# Patient Record
Sex: Male | Born: 1945 | Race: White | Hispanic: No | Marital: Married | Smoking: Former smoker
Health system: Southern US, Community
[De-identification: ages and names within clinical notes are randomized; demographics above are authoritative.]

## PROBLEM LIST (undated history)

## (undated) DIAGNOSIS — K5730 Diverticulosis of large intestine without perforation or abscess without bleeding: Secondary | ICD-10-CM

## (undated) DIAGNOSIS — I499 Cardiac arrhythmia, unspecified: Secondary | ICD-10-CM

## (undated) DIAGNOSIS — I4891 Unspecified atrial fibrillation: Secondary | ICD-10-CM

## (undated) DIAGNOSIS — R198 Other specified symptoms and signs involving the digestive system and abdomen: Secondary | ICD-10-CM

## (undated) DIAGNOSIS — K219 Gastro-esophageal reflux disease without esophagitis: Secondary | ICD-10-CM

## (undated) DIAGNOSIS — I10 Essential (primary) hypertension: Secondary | ICD-10-CM

## (undated) DIAGNOSIS — E079 Disorder of thyroid, unspecified: Secondary | ICD-10-CM

## (undated) DIAGNOSIS — E785 Hyperlipidemia, unspecified: Secondary | ICD-10-CM

## (undated) DIAGNOSIS — I340 Nonrheumatic mitral (valve) insufficiency: Secondary | ICD-10-CM

## (undated) DIAGNOSIS — E039 Hypothyroidism, unspecified: Secondary | ICD-10-CM

## (undated) DIAGNOSIS — J189 Pneumonia, unspecified organism: Secondary | ICD-10-CM

## (undated) DIAGNOSIS — S72142A Displaced intertrochanteric fracture of left femur, initial encounter for closed fracture: Secondary | ICD-10-CM

## (undated) DIAGNOSIS — K5732 Diverticulitis of large intestine without perforation or abscess without bleeding: Secondary | ICD-10-CM

## (undated) DIAGNOSIS — G479 Sleep disorder, unspecified: Secondary | ICD-10-CM

## (undated) DIAGNOSIS — R4586 Emotional lability: Secondary | ICD-10-CM

## (undated) DIAGNOSIS — I779 Disorder of arteries and arterioles, unspecified: Secondary | ICD-10-CM

## (undated) DIAGNOSIS — M1990 Unspecified osteoarthritis, unspecified site: Secondary | ICD-10-CM

## (undated) DIAGNOSIS — I071 Rheumatic tricuspid insufficiency: Secondary | ICD-10-CM

## (undated) DIAGNOSIS — I480 Paroxysmal atrial fibrillation: Secondary | ICD-10-CM

## (undated) DIAGNOSIS — D649 Anemia, unspecified: Secondary | ICD-10-CM

## (undated) DIAGNOSIS — I440 Atrioventricular block, first degree: Secondary | ICD-10-CM

## (undated) DIAGNOSIS — K5790 Diverticulosis of intestine, part unspecified, without perforation or abscess without bleeding: Secondary | ICD-10-CM

## (undated) HISTORY — DX: Hypothyroidism, unspecified: E03.9

## (undated) HISTORY — DX: Unspecified atrial fibrillation: I48.91

## (undated) HISTORY — PX: TONSILLECTOMY: SUR1361

## (undated) HISTORY — DX: Gastro-esophageal reflux disease without esophagitis: K21.9

## (undated) HISTORY — DX: Atrioventricular block, first degree: I44.0

## (undated) HISTORY — DX: Essential (primary) hypertension: I10

## (undated) HISTORY — DX: Nonrheumatic mitral (valve) insufficiency: I34.0

## (undated) HISTORY — DX: Displaced intertrochanteric fracture of left femur, initial encounter for closed fracture: S72.142A

## (undated) HISTORY — DX: Hyperlipidemia, unspecified: E78.5

## (undated) HISTORY — DX: Rheumatic tricuspid insufficiency: I07.1

## (undated) HISTORY — PX: KNEE ARTHROSCOPY: SUR90

## (undated) HISTORY — DX: Diverticulosis of intestine, part unspecified, without perforation or abscess without bleeding: K57.90

## (undated) HISTORY — DX: Disorder of arteries and arterioles, unspecified: I77.9

## (undated) HISTORY — DX: Paroxysmal atrial fibrillation: I48.0

## (undated) HISTORY — DX: Anemia, unspecified: D64.9

## (undated) HISTORY — PX: NASAL SEPTUM SURGERY: SHX37

## (undated) HISTORY — DX: Disorder of thyroid, unspecified: E07.9

## (undated) HISTORY — DX: Diverticulosis of large intestine without perforation or abscess without bleeding: K57.30

## (undated) HISTORY — PX: HAND SURGERY: SHX662

## (undated) HISTORY — DX: Unspecified osteoarthritis, unspecified site: M19.90

## (undated) HISTORY — DX: Diverticulitis of large intestine without perforation or abscess without bleeding: K57.32

## (undated) HISTORY — PX: OTHER SURGICAL HISTORY: SHX169

## (undated) HISTORY — PX: SHOULDER OPEN ROTATOR CUFF REPAIR: SHX2407

---

## 2006-10-02 ENCOUNTER — Observation Stay: Payer: Self-pay | Admitting: Internal Medicine

## 2009-05-31 DIAGNOSIS — D62 Acute posthemorrhagic anemia: Secondary | ICD-10-CM

## 2009-05-31 DIAGNOSIS — E039 Hypothyroidism, unspecified: Secondary | ICD-10-CM | POA: Insufficient documentation

## 2009-05-31 DIAGNOSIS — A419 Sepsis, unspecified organism: Secondary | ICD-10-CM

## 2009-05-31 DIAGNOSIS — F32A Depression, unspecified: Secondary | ICD-10-CM | POA: Insufficient documentation

## 2009-05-31 DIAGNOSIS — N1330 Unspecified hydronephrosis: Secondary | ICD-10-CM

## 2009-05-31 DIAGNOSIS — K5730 Diverticulosis of large intestine without perforation or abscess without bleeding: Secondary | ICD-10-CM | POA: Insufficient documentation

## 2009-05-31 DIAGNOSIS — E7800 Pure hypercholesterolemia, unspecified: Secondary | ICD-10-CM | POA: Insufficient documentation

## 2009-05-31 DIAGNOSIS — F329 Major depressive disorder, single episode, unspecified: Secondary | ICD-10-CM

## 2009-05-31 DIAGNOSIS — I10 Essential (primary) hypertension: Secondary | ICD-10-CM | POA: Insufficient documentation

## 2009-05-31 DIAGNOSIS — K5732 Diverticulitis of large intestine without perforation or abscess without bleeding: Secondary | ICD-10-CM | POA: Insufficient documentation

## 2009-05-31 HISTORY — DX: Diverticulosis of large intestine without perforation or abscess without bleeding: K57.30

## 2009-05-31 HISTORY — DX: Hypothyroidism, unspecified: E03.9

## 2009-05-31 HISTORY — DX: Diverticulitis of large intestine without perforation or abscess without bleeding: K57.32

## 2012-12-29 DIAGNOSIS — K5790 Diverticulosis of intestine, part unspecified, without perforation or abscess without bleeding: Secondary | ICD-10-CM

## 2012-12-29 HISTORY — DX: Diverticulosis of intestine, part unspecified, without perforation or abscess without bleeding: K57.90

## 2012-12-29 HISTORY — PX: COLONOSCOPY: SHX174

## 2013-01-12 ENCOUNTER — Encounter: Payer: Self-pay | Admitting: Internal Medicine

## 2013-02-04 ENCOUNTER — Ambulatory Visit (AMBULATORY_SURGERY_CENTER): Payer: Medicare Other | Admitting: *Deleted

## 2013-02-04 ENCOUNTER — Encounter: Payer: Self-pay | Admitting: Internal Medicine

## 2013-02-04 VITALS — Ht 71.0 in | Wt 236.0 lb

## 2013-02-04 DIAGNOSIS — Z1211 Encounter for screening for malignant neoplasm of colon: Secondary | ICD-10-CM

## 2013-02-04 MED ORDER — MOVIPREP 100 G PO SOLR: ORAL | Status: DC

## 2013-02-04 NOTE — Progress Notes (Signed)
Pt. States he had a colonoscopy 10 years ago in [place].  Doctor is deceased that performed the procedure.  Eugene White states he did not have any polyps only mild diverticulosis.

## 2013-02-18 ENCOUNTER — Encounter: Payer: Self-pay | Admitting: Internal Medicine

## 2013-02-18 ENCOUNTER — Ambulatory Visit (AMBULATORY_SURGERY_CENTER): Payer: Medicare Other | Admitting: Internal Medicine

## 2013-02-18 VITALS — BP 141/80 | HR 51 | Temp 96.9°F | Resp 17 | Ht 71.0 in | Wt 236.0 lb

## 2013-02-18 DIAGNOSIS — Z1211 Encounter for screening for malignant neoplasm of colon: Secondary | ICD-10-CM

## 2013-02-18 DIAGNOSIS — D126 Benign neoplasm of colon, unspecified: Secondary | ICD-10-CM

## 2013-02-18 MED ORDER — SODIUM CHLORIDE 0.9 % IV SOLN: 500.0000 mL | INTRAVENOUS | Status: DC

## 2013-02-18 NOTE — Progress Notes (Addendum)
Patient did not have preoperative order for IV antibiotic SSI prophylaxis. (G8918)  Patient did not experience any of the following events: a burn prior to discharge; a fall within the facility; wrong site/side/patient/procedure/implant event; or a hospital transfer or hospital admission upon discharge from the facility. (G8907)  

## 2013-02-18 NOTE — Progress Notes (Signed)
To pacu vss, report to RN, bbs=clear

## 2013-02-18 NOTE — Op Note (Signed)
[facility] [address] [place], [address]   COLONOSCOPY PROCEDURE REPORT  PATIENT: [name], Eugene  MR#: [id number] BIRTHDATE: 12/01/1946 , 66  yrs. old GENDER: Male ENDOSCOPIST: Hart Carwin, MD REFERRED BY:  Mila Merry, M.D. PROCEDURE DATE:  02/18/2013 PROCEDURE:   Colonoscopy with cold biopsy polypectomy ASA CLASS:   Class II INDICATIONS:Average risk patient for colon cancer and normal colonoscopy in [facility] 2002. MEDICATIONS: MAC sedation, administered by CRNA and Propofol (Diprivan) 220 mg IV  DESCRIPTION OF PROCEDURE:   After the risks and benefits and of the procedure were explained, informed consent was obtained.  A digital rectal exam revealed no abnormalities of the rectum.    The LB CF-[id number]  endoscope was introduced through the anus and advanced to the cecum, which was identified by both the appendix and ileocecal valve .  The quality of the prep was good, using MoviPrep .  The instrument was then slowly withdrawn as the colon was fully examined.     COLON FINDINGS: A smooth sessile polyp ranging between 3-107mm in size was found in the sigmoid colon.  A polypectomy was performed with cold forceps.  The resection was complete and the polyp tissue was completely retrieved.   There was severe diverticulosis noted throughout the entire examined colon with associated angulation, muscular hypertrophy and tortuosity.     Retroflexed views revealed no abnormalities.     The scope was then withdrawn from the patient and the procedure completed.  COMPLICATIONS: There were no complications. ENDOSCOPIC IMPRESSION: 1.   Sessile polyp ranging between 3-86mm in size was found in the sigmoid colon; polypectomy was performed with cold forceps 2.   There was severe diverticulosis noted throughout the entire examined colon , large wide mouth diverticuli particularly in the sigmoid colon  RECOMMENDATIONS: 1.  Await pathology results 2.  High  fiber diet 3. Metamucil 1-2 tsp daily  REPEAT EXAM: for Colonoscopy.pending pathology  cc:  _______________________________ eSigned:  Hart Carwin, MD 02/18/2013 10:07 AM     PATIENT NAME:  [name], Eugene MR#: [id number]

## 2013-02-18 NOTE — Patient Instructions (Addendum)
YOU HAD AN ENDOSCOPIC PROCEDURE TODAY AT THE [facility]: Refer to the procedure report that was given to you for any specific questions about what was found during the examination.  If the procedure report does not answer your questions, please call your gastroenterologist to clarify.  If you requested that your care partner not be given the details of your procedure findings, then the procedure report has been included in a sealed envelope for you to review at your convenience later.  YOU SHOULD EXPECT: Some feelings of bloating in the abdomen. Passage of more gas than usual.  Walking can help get rid of the air that was put into your GI tract during the procedure and reduce the bloating. If you had a lower endoscopy (such as a colonoscopy or flexible sigmoidoscopy) you may notice spotting of blood in your stool or on the toilet paper. If you underwent a bowel prep for your procedure, then you may not have a normal bowel movement for a few days.  DIET: Your first meal following the procedure should be a light meal and then it is ok to progress to your normal diet.  A half-sandwich or bowl of soup is an example of a good first meal.  Heavy or fried foods are harder to digest and may make you feel nauseous or bloated.  Likewise meals heavy in dairy and vegetables can cause extra gas to form and this can also increase the bloating.  Drink plenty of fluids but you should avoid alcoholic beverages for 24 hours.  ACTIVITY: Your care partner should take you home directly after the procedure.  You should plan to take it easy, moving slowly for the rest of the day.  You can resume normal activity the day after the procedure however you should NOT DRIVE or use heavy machinery for 24 hours (because of the sedation medicines used during the test).    SYMPTOMS TO REPORT IMMEDIATELY: A gastroenterologist can be reached at any hour.  During normal business hours, 8:30 AM to 5:00 PM Monday through Friday,  call [phone].  After hours and on weekends, please call the GI answering service at [phone] who will take a message and have the physician on call contact you.   Following lower endoscopy (colonoscopy or flexible sigmoidoscopy):  Excessive amounts of blood in the stool  Significant tenderness or worsening of abdominal pains  Swelling of the abdomen that is new, acute  Fever of 100F or higher  FOLLOW UP: If any biopsies were taken you will be contacted by phone or by letter within the next 1-3 weeks.  Call your gastroenterologist if you have not heard about the biopsies in 3 weeks.  Our staff will call the home number listed on your records the next business day following your procedure to check on you and address any questions or concerns that you may have at that time regarding the information given to you following your procedure. This is a courtesy call and so if there is no answer at the home number and we have not heard from you through the emergency physician on call, we will assume that you have returned to your regular daily activities without incident.  Thank-you for choosing [place] for your healthcare needs today.  SIGNATURES/CONFIDENTIALITY: You and/or your care partner have signed paperwork which will be entered into your electronic medical record.  These signatures attest to the fact that that the information above on your After Visit Summary has been reviewed and  is understood.  Full responsibility of the confidentiality of this discharge information lies with you and/or your care-partner.

## 2013-02-18 NOTE — Progress Notes (Signed)
Called to room to assist during endoscopic procedure.  Patient ID and intended procedure confirmed with present staff. Received instructions for my participation in the procedure from the performing physician.  

## 2013-02-21 ENCOUNTER — Telehealth: Payer: Self-pay

## 2013-02-21 NOTE — Telephone Encounter (Signed)
  Follow up Call-  Call back number 02/18/2013  Post procedure Call Back phone  # [phone]  Permission to leave phone message Yes     Patient questions:  Do you have a fever, pain , or abdominal swelling? no Pain Score  0 *  Have you tolerated food without any problems? yes  Have you been able to return to your normal activities? yes  Do you have any questions about your discharge instructions: Diet   no Medications  no Follow up visit  no  Do you have questions or concerns about your Care? no  Actions: * If pain score is 4 or above: No action needed, pain <4.

## 2013-02-22 ENCOUNTER — Encounter: Payer: Self-pay | Admitting: Internal Medicine

## 2013-03-18 ENCOUNTER — Ambulatory Visit: Payer: Self-pay | Admitting: Family Medicine

## 2014-03-08 ENCOUNTER — Encounter: Payer: Self-pay | Admitting: Internal Medicine

## 2014-03-08 ENCOUNTER — Encounter: Payer: Self-pay | Admitting: *Deleted

## 2014-05-09 ENCOUNTER — Ambulatory Visit: Payer: Medicare Other | Admitting: Internal Medicine

## 2014-05-30 ENCOUNTER — Encounter: Payer: Self-pay | Admitting: *Deleted

## 2014-06-01 ENCOUNTER — Encounter: Payer: Self-pay | Admitting: Gastroenterology

## 2014-06-01 ENCOUNTER — Ambulatory Visit (INDEPENDENT_AMBULATORY_CARE_PROVIDER_SITE_OTHER): Payer: Commercial Managed Care - HMO | Admitting: Gastroenterology

## 2014-06-01 ENCOUNTER — Other Ambulatory Visit (INDEPENDENT_AMBULATORY_CARE_PROVIDER_SITE_OTHER): Payer: Commercial Managed Care - HMO

## 2014-06-01 VITALS — BP 130/84 | HR 90 | Ht 71.0 in | Wt 227.0 lb

## 2014-06-01 DIAGNOSIS — R109 Unspecified abdominal pain: Secondary | ICD-10-CM

## 2014-06-01 DIAGNOSIS — R634 Abnormal weight loss: Secondary | ICD-10-CM

## 2014-06-01 DIAGNOSIS — D72829 Elevated white blood cell count, unspecified: Secondary | ICD-10-CM

## 2014-06-01 DIAGNOSIS — R509 Fever, unspecified: Secondary | ICD-10-CM

## 2014-06-01 DIAGNOSIS — D649 Anemia, unspecified: Secondary | ICD-10-CM

## 2014-06-01 LAB — BASIC METABOLIC PANEL
BUN: 13 mg/dL (ref 6–23)
CALCIUM: 9.5 mg/dL (ref 8.4–10.5)
CHLORIDE: 101 meq/L (ref 96–112)
CO2: 28 mEq/L (ref 19–32)
CREATININE: 0.7 mg/dL (ref 0.4–1.5)
GFR: 117 mL/min (ref >60.00)
Glucose, Bld: 115 mg/dL — ABNORMAL HIGH (ref 70–99)
Potassium: 4 mEq/L (ref 3.5–5.1)
SODIUM: 136 meq/L (ref 135–145)

## 2014-06-01 LAB — IGA: IGA: 188 mg/dL (ref 68–378)

## 2014-06-01 NOTE — Patient Instructions (Addendum)
You have been scheduled for an endoscopy with propofol. Please follow written instructions given to you at your visit today. If you use inhalers (even only as needed), please bring them with you on the day of your procedure. Your physician has requested that you go to www.startemmi.com and enter the access code given to you at your visit today. This web site gives a general overview about your procedure. However, you should still follow specific instructions given to you by our office regarding your preparation for the procedure.  Your physician has requested that you go to the basement for the following lab work before leaving today: TTG IGA BMP  You have been scheduled for a CT scan of the abdomen and pelvis at [facility]. You will have to go by [facility] to pick up the contrast for the scan Nothing to eat or drink four hours prior to scan  Your appointment is scheduled for 06-05-2014 at 330 pm, please arrive 15 minutes early for registration  Address: [address] [place], [place] [address]  Phone Number: [phone]

## 2014-06-02 ENCOUNTER — Encounter: Payer: Self-pay | Admitting: Gastroenterology

## 2014-06-02 DIAGNOSIS — R109 Unspecified abdominal pain: Secondary | ICD-10-CM | POA: Insufficient documentation

## 2014-06-02 DIAGNOSIS — R634 Abnormal weight loss: Secondary | ICD-10-CM | POA: Insufficient documentation

## 2014-06-02 DIAGNOSIS — D72829 Elevated white blood cell count, unspecified: Secondary | ICD-10-CM | POA: Insufficient documentation

## 2014-06-02 DIAGNOSIS — R509 Fever, unspecified: Secondary | ICD-10-CM | POA: Insufficient documentation

## 2014-06-02 DIAGNOSIS — R1084 Generalized abdominal pain: Secondary | ICD-10-CM | POA: Insufficient documentation

## 2014-06-02 DIAGNOSIS — D649 Anemia, unspecified: Secondary | ICD-10-CM | POA: Insufficient documentation

## 2014-06-02 NOTE — Progress Notes (Signed)
     06/02/2014 Eugene White [id number] 05/11/1946   History of Present Illness:  This is a pleasant 68 year old male who is known to Dr. Olevia Perches for previous colonoscopy in February 2014. He was found to have a sessile polyp removed from the sigmoid colon that was hyperplastic on pathology at that time as well, as having severe diverticulosis throughout the entire colon.  He presents to our office today for a few different issues. He states that on May 25 he developed severe abdominal pain that was rather diffuse and came on suddenly. This resolved slowly over a few days time, and he currently has little to no pain. Throughout all of this, however, he's also had some low-grade fevers as well as a 6 pound weight loss. Recent labs show a CBC with several abnormalities including a leukocytosis of 16.1. His platelets are also elevated at 511. His hemoglobin is low at 10.4 grams, which is down from 15 grams (per his verbal report) in February.  His ferritin is normal at 103 and TIBC is normal at 362, but serum iron is low at 15 and iron percent saturation is low at 4%. Vitamin B12 and folate are normal. He says that he was previously taking meloxicam but has discontinued that along with his aspirin 81 mg. He was Hemoccult negative at his PCPs office. He is currently taking Cipro for empiric treatment of diverticulitis per recommendations from the PA at his PCPs office. He reports that he underwent an EGD 25+ years ago. He has been on pantoprazole 40 mg daily for chronic GERD for several years.   Current Medications, Allergies, Past Medical History, Past Surgical History, Family History and Social History were reviewed in [employer] record.   Physical Exam: BP 130/84  Pulse 90  Ht $R'5\' 11"'fa$  (1.803 m)  Wt 227 lb (102.967 kg)  BMI 31.67 kg/m2 General: Well developed white male in no acute distress Head: Normocephalic and atraumatic Eyes:  Sclerae anicteric, conjunctiva pink  Ears:  Normal auditory acuity Lungs: Clear throughout to auscultation Heart: Regular rate and rhythm Abdomen: Soft, non-distended.  Normal bowel sounds.  Minimal diffuse TTP without R/R/G. Musculoskeletal: Symmetrical with no gross deformities  Extremities: No edema  Neurological: Alert oriented x 4, grossly non-focal Psychological:  Alert and cooperative. Normal mood and affect  Assessment and Recommendations: -This is a 68 year old male who has a myriad of issues that have occurred rather abruptly.  He had some diffuse nonspecific abdominal pain, which has improved significantly, but persists with low-grade fevers and has had some weight loss. Labs show a new leukocytosis of 16.1, new iron deficiency anemia of 10.4 grams, and thrombocytopenia as well.  I am unsure what process is causing these issues. I think that beginning with CT scan of the abdomen and pelvis with contrast is not unreasonable. We will check some other labs including BMP and celiac titers. We will schedule for EGD to rule any source of upper GI bleed causing his anemia. He was previously taking meloxicam, but has discontinued that and will remain off of that medication. -Self-limited/transient rectal bleeding:  This occurred a couple of months ago on one occasion. Likely low-grade diverticular bleed or hemorrhoidal bleed. Now resolved. -Long-standing GERD:  Seems to be overall well-controlled on pantoprazole 40 mg daily, which he will continue for now.

## 2014-06-05 ENCOUNTER — Other Ambulatory Visit: Payer: Commercial Managed Care - HMO

## 2014-06-05 ENCOUNTER — Ambulatory Visit
Admission: RE | Admit: 2014-06-05 | Discharge: 2014-06-05 | Disposition: A | Discharge status: Home / Self Care | Payer: Commercial Managed Care - HMO | Source: Ambulatory Visit | Attending: Gastroenterology | Admitting: Gastroenterology

## 2014-06-05 DIAGNOSIS — R509 Fever, unspecified: Secondary | ICD-10-CM

## 2014-06-05 DIAGNOSIS — R109 Unspecified abdominal pain: Secondary | ICD-10-CM

## 2014-06-05 DIAGNOSIS — D72829 Elevated white blood cell count, unspecified: Secondary | ICD-10-CM

## 2014-06-05 DIAGNOSIS — R634 Abnormal weight loss: Secondary | ICD-10-CM

## 2014-06-05 MED ORDER — IOHEXOL 300 MG/ML  SOLN
125.0000 mL | Freq: Once | INTRAMUSCULAR | Status: AC | PRN
  Administered 2014-06-05: 125 mL via INTRAVENOUS

## 2014-06-05 NOTE — Progress Notes (Signed)
Reviewed and agree, CT scan appropriate to r/o pancreatic process, lymphoma  Etc.

## 2014-06-06 ENCOUNTER — Other Ambulatory Visit: Payer: Self-pay | Admitting: *Deleted

## 2014-06-06 DIAGNOSIS — R109 Unspecified abdominal pain: Secondary | ICD-10-CM

## 2014-06-06 LAB — T-TRANSGLUTAMINASE (TTG) IGG: TISSUE TRANSGLUT AB: 2 U/mL (ref 0–5)

## 2014-06-08 ENCOUNTER — Encounter: Payer: Self-pay | Admitting: Gastroenterology

## 2014-06-09 ENCOUNTER — Other Ambulatory Visit: Payer: Self-pay | Admitting: *Deleted

## 2014-06-09 ENCOUNTER — Encounter: Payer: Self-pay | Admitting: Internal Medicine

## 2014-06-09 ENCOUNTER — Other Ambulatory Visit (INDEPENDENT_AMBULATORY_CARE_PROVIDER_SITE_OTHER): Payer: Commercial Managed Care - HMO

## 2014-06-09 ENCOUNTER — Ambulatory Visit (AMBULATORY_SURGERY_CENTER): Payer: Commercial Managed Care - HMO | Admitting: Internal Medicine

## 2014-06-09 VITALS — BP 126/86 | HR 66 | Temp 97.3°F | Resp 26 | Ht 71.0 in | Wt 227.0 lb

## 2014-06-09 DIAGNOSIS — D649 Anemia, unspecified: Secondary | ICD-10-CM

## 2014-06-09 DIAGNOSIS — D1330 Benign neoplasm of unspecified part of small intestine: Secondary | ICD-10-CM

## 2014-06-09 DIAGNOSIS — R933 Abnormal findings on diagnostic imaging of other parts of digestive tract: Secondary | ICD-10-CM

## 2014-06-09 DIAGNOSIS — K5289 Other specified noninfective gastroenteritis and colitis: Secondary | ICD-10-CM

## 2014-06-09 DIAGNOSIS — K529 Noninfective gastroenteritis and colitis, unspecified: Secondary | ICD-10-CM

## 2014-06-09 LAB — CBC WITH DIFFERENTIAL/PLATELET
BASOS PCT: 0.6 % (ref 0.0–3.0)
Basophils Absolute: 0 10*3/uL (ref 0.0–0.1)
EOS PCT: 4.5 % (ref 0.0–5.0)
Eosinophils Absolute: 0.3 10*3/uL (ref 0.0–0.7)
HCT: 31.8 % — ABNORMAL LOW (ref 39.0–52.0)
Hemoglobin: 10.2 g/dL — ABNORMAL LOW (ref 13.0–17.0)
LYMPHS PCT: 26.6 % (ref 12.0–46.0)
Lymphs Abs: 1.6 10*3/uL (ref 0.7–4.0)
MCHC: 32 g/dL (ref 30.0–36.0)
MCV: 73.8 fl — AB (ref 78.0–100.0)
MONO ABS: 0.6 10*3/uL (ref 0.1–1.0)
MONOS PCT: 10.5 % (ref 3.0–12.0)
NEUTROS PCT: 57.8 % (ref 43.0–77.0)
Neutro Abs: 3.5 10*3/uL (ref 1.4–7.7)
PLATELETS: 564 10*3/uL — AB (ref 150.0–400.0)
RBC: 4.3 Mil/uL (ref 4.22–5.81)
RDW: 20 % — ABNORMAL HIGH (ref 11.5–15.5)
WBC: 6 10*3/uL (ref 4.0–10.5)

## 2014-06-09 LAB — SEDIMENTATION RATE: SED RATE: 29 mm/h — AB (ref 0–22)

## 2014-06-09 MED ORDER — SODIUM CHLORIDE 0.9 % IV SOLN: 500.0000 mL | INTRAVENOUS | Status: DC

## 2014-06-09 MED ORDER — HYDROCHLOROTHIAZIDE 25 MG PO TABS: 25.0000 mg | ORAL_TABLET | Freq: Every day | ORAL | Status: DC

## 2014-06-09 NOTE — Op Note (Signed)
[place]  [address]. [place], [address]   ENDOSCOPY PROCEDURE REPORT  PATIENT: Hanley, Woerner  MR#: [id number] BIRTHDATE: Apr 17, 1946 , 67  yrs. old GENDER: Male ENDOSCOPIST: Lafayette Dragon, MD REFERRED BY:  Lelon Huh, M.D. PROCEDURE DATE:  06/09/2014 PROCEDURE:  enteroscopy with biopsy ASA CLASS:     Class II INDICATIONS:  A.  acute abdominal pain.  Fever and anemia.  CT scan of the abdomen suggest acute jejunitis associated with mesenteric stranding.  He has completed a course of Cipro and is feeling better, he has been on ACE inhibitors, rule out angioneurotic edema. MEDICATIONS: MAC sedation, administered by CRNA and propofol (Diprivan) 250mg  IV TOPICAL ANESTHETIC: none  DESCRIPTION OF PROCEDURE: After the risks benefits and alternatives of the procedure were thoroughly explained, informed consent was obtained.  The LB PFC-H190 [id number] endoscope was introduced through the mouth and advanced to the proximal jejunum. Without limitations.  The instrument was slowly withdrawn as the mucosa was fully examined.      Esophagus, proximal mid and distal esophageal mucosa was normal. Was no evidence of esophagitis,there was a mild nonobstructing esophageal stricture which allowed the endoscope to traverse into small hiatal hernia Stoma: Gastric mucosa was normal there was no evidence of gastritis. Retroflexion of the endoscope revealing normal fundus and cardia[ duodenum: Duodenal bulb and descending duodenum was unremarkable. Mucosa appeared normal there was no evidence of edema or any inflammatory changes. Jejunum: Jejunal mucosa appeared normal. I was able to advance to 180 cm the show was the full size of the enteroscope. This was located most likely distal to the ligament of Treitz in the proximal jejunum. Random biopsies were obtained of the jejunal mucosa which appeared normal        The scope was then withdrawn from the patient and the  procedure completed.  COMPLICATIONS: There were no complications. ENDOSCOPIC IMPRESSION: essentially normal enteroscopy of the esophagus stomach duodenum and proximal jejunum to 190 cm. No evidence of inflammatory changes which would correlate with the CT scan findings. 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  Await biopsy results 2.  discontinue ACE inhibitor CBC and sed rates today Monitor her temperature Light food  REPEAT EXAM:fno recall.  eSigned:  Lafayette Dragon, MD 06/09/2014 10:22 AM   CC:  PATIENT NAME:  Artist, Bloom MR#: [id number]

## 2014-06-09 NOTE — Progress Notes (Signed)
Pt. To lab for CBC and sed rate before leaving clinic.

## 2014-06-09 NOTE — Progress Notes (Signed)
A/ox3 pleased with MAC, report to Jane RN 

## 2014-06-09 NOTE — Patient Instructions (Signed)

## 2014-06-09 NOTE — Progress Notes (Signed)
Called to room to assist during endoscopic procedure.  Patient ID and intended procedure confirmed with present staff. Received instructions for my participation in the procedure from the performing physician.  

## 2014-06-12 ENCOUNTER — Telehealth: Payer: Self-pay | Admitting: *Deleted

## 2014-06-12 NOTE — Telephone Encounter (Signed)
  Follow up Call-  Call back number 06/09/2014 02/18/2013  Post procedure Call Back phone  # [phone]  Permission to leave phone message Yes Yes     Patient questions:  Do you have a fever, pain , or abdominal swelling? no Pain Score  0 *  Have you tolerated food without any problems? yes  Have you been able to return to your normal activities? yes  Do you have any questions about your discharge instructions: Diet   no Medications  no Follow up visit  no  Do you have questions or concerns about your Care? no  Actions: * If pain score is 4 or above: No action needed, pain <4.

## 2014-06-13 ENCOUNTER — Encounter: Payer: Self-pay | Admitting: Internal Medicine

## 2014-06-14 ENCOUNTER — Encounter: Payer: Self-pay | Admitting: *Deleted

## 2014-06-20 ENCOUNTER — Telehealth: Payer: Self-pay | Admitting: Internal Medicine

## 2014-06-20 NOTE — Telephone Encounter (Signed)
Patient is asking for lab results. He is also asking if he can restart his low dose aspirin and Meloxicam. Please, advise.

## 2014-06-21 NOTE — Telephone Encounter (Signed)
Please call pt with low Hgb  And Iron levels, needs to be on Iron supplements daily. Please repeat CBC in in 2 weeks ( which will be 1 month since his last exam).

## 2014-06-22 ENCOUNTER — Other Ambulatory Visit: Payer: Self-pay | Admitting: *Deleted

## 2014-06-22 DIAGNOSIS — D509 Iron deficiency anemia, unspecified: Secondary | ICD-10-CM

## 2014-06-22 NOTE — Telephone Encounter (Signed)
Patient given results and recommendations. He is asking if he can restart his Meloxicam and low dose aspirin. Please, advise.

## 2014-06-23 NOTE — Telephone Encounter (Signed)
OK to resume Meloxican and ASA

## 2014-06-23 NOTE — Telephone Encounter (Signed)
Patient notified of recommendations. 

## 2014-07-06 ENCOUNTER — Telehealth: Payer: Self-pay | Admitting: *Deleted

## 2014-07-06 NOTE — Telephone Encounter (Signed)
Left a message for patient to come for labs next week.

## 2014-07-06 NOTE — Telephone Encounter (Signed)
Message copied by Hulan Saas on Thu Jul 06, 2014  8:17 AM ------      Message from: Hulan Saas      Created: Thu Jun 22, 2014  8:34 AM       Call and remind due for CBC for DB on 07/10/14. Lab in [facility]. ------

## 2014-07-28 ENCOUNTER — Ambulatory Visit: Payer: Medicare Other | Admitting: Internal Medicine

## 2014-10-24 ENCOUNTER — Other Ambulatory Visit: Payer: Self-pay | Admitting: Orthopedic Surgery

## 2015-01-01 ENCOUNTER — Other Ambulatory Visit (HOSPITAL_COMMUNITY): Payer: Self-pay | Admitting: *Deleted

## 2015-01-01 NOTE — Patient Instructions (Addendum)
Eugene White  01/01/2015   Your procedure is scheduled on: 01/09/15   Report to [facility]  Entrance and follow signs to               [place] at 5:30 AM.   Call this number if you have problems the morning of surgery [phone]   Remember:  Do not eat food or drink liquids :After Midnight.     Take these medicines the morning of surgery with A SIP OF WATER: AMLODIPINE / LIPITOR / LEVOTHYROXINE / PROTONIX / EFFEXOR                               You may not have any metal on your body including hair pins and              piercings  Do not wear jewelry, make-up, lotions, powders or perfumes.             Do not wear nail polish.  Do not shave  48 hours prior to surgery.              Men may shave face and neck.   Do not bring valuables to the hospital. [place].  Contacts, dentures or bridgework may not be worn into surgery.  Leave suitcase in the car. After surgery it may be brought to your room.     Patients discharged the day of surgery will not be allowed to drive home.  Name and phone number of your driver:  Special Instructions: N/A              Please read over the following fact sheets you were given: _____________________________________________________________________                                                     [place]  Before surgery, you can play an important role.  Because skin is not sterile, your skin needs to be as free of germs as possible.  You can reduce the number of germs on your skin by washing with CHG (chlorahexidine gluconate) soap before surgery.  CHG is an antiseptic cleaner which kills germs and bonds with the skin to continue killing germs even after washing. Please DO NOT use if you have an allergy to CHG or antibacterial soaps.  If your skin becomes reddened/irritated stop using the CHG and inform your nurse when you arrive at  Short Stay. Do not shave (including legs and underarms) for at least 48 hours prior to the first CHG shower.  You may shave your face. Please follow these instructions carefully:   1.  Shower with CHG Soap the night before surgery and the  morning of Surgery.   2.  If you choose to wash your hair, wash your hair first as usual with your  normal  Shampoo.   3.  After you shampoo, rinse your hair and body thoroughly to remove the  shampoo.  4.  Use CHG as you would any other liquid soap.  You can apply chg directly  to the skin and wash . Gently wash with scrungie or clean wascloth    5.  Apply the CHG Soap to your body ONLY FROM THE NECK DOWN.   Do not use on open                           Wound or open sores. Avoid contact with eyes, ears mouth and genitals (private parts).                        Genitals (private parts) with your normal soap.              6.  Wash thoroughly, paying special attention to the area where your surgery  will be performed.   7.  Thoroughly rinse your body with warm water from the neck down.   8.  DO NOT shower/wash with your normal soap after using and rinsing off  the CHG Soap .                9.  Pat yourself dry with a clean towel.             10.  Wear clean pajamas.             11.  Place clean sheets on your bed the night of your first shower and do not  sleep with pets.  Day of Surgery : Do not apply any lotions/deodorants the morning of surgery.  Please wear clean clothes to the hospital/surgery center.  FAILURE TO FOLLOW THESE INSTRUCTIONS MAY RESULT IN THE CANCELLATION OF YOUR SURGERY    PATIENT SIGNATURE_________________________________  ______________________________________________________________________     Adam Phenix  An incentive spirometer is a tool that can help keep your lungs clear and active. This tool measures how well you are filling your lungs with each breath. Taking  long deep breaths may help reverse or decrease the chance of developing breathing (pulmonary) problems (especially infection) following:  A long period of time when you are unable to move or be active. BEFORE THE PROCEDURE   If the spirometer includes an indicator to show your best effort, your nurse or respiratory therapist will set it to a desired goal.  If possible, sit up straight or lean slightly forward. Try not to slouch.  Hold the incentive spirometer in an upright position. INSTRUCTIONS FOR USE   Sit on the edge of your bed if possible, or sit up as far as you can in bed or on a chair.  Hold the incentive spirometer in an upright position.  Breathe out normally.  Place the mouthpiece in your mouth and seal your lips tightly around it.  Breathe in slowly and as deeply as possible, raising the piston or the ball toward the top of the column.  Hold your breath for 3-5 seconds or for as long as possible. Allow the piston or ball to fall to the bottom of the column.  Remove the mouthpiece from your mouth and breathe out normally.  Rest for a few seconds and repeat Steps 1 through 7 at least 10 times every 1-2 hours when you are awake. Take your time and take a few normal breaths between deep breaths.  The spirometer may include an indicator to show your best effort. Use the indicator as a goal to work toward during  each repetition.  After each set of 10 deep breaths, practice coughing to be sure your lungs are clear. If you have an incision (the cut made at the time of surgery), support your incision when coughing by placing a pillow or rolled up towels firmly against it. Once you are able to get out of bed, walk around indoors and cough well. You may stop using the incentive spirometer when instructed by your caregiver.  RISKS AND COMPLICATIONS  Take your time so you do not get dizzy or light-headed.  If you are in pain, you may need to take or ask for pain medication before  doing incentive spirometry. It is harder to take a deep breath if you are having pain. AFTER USE  Rest and breathe slowly and easily.  It can be helpful to keep track of a log of your progress. Your caregiver can provide you with a simple table to help with this. If you are using the spirometer at home, follow these instructions: [place] IF:   You are having difficultly using the spirometer.  You have trouble using the spirometer as often as instructed.  Your pain medication is not giving enough relief while using the spirometer.  You develop fever of 100.5 F (38.1 C) or higher. SEEK IMMEDIATE MEDICAL CARE IF:   You cough up bloody sputum that had not been present before.  You develop fever of 102 F (38.9 C) or greater.  You develop worsening pain at or near the incision site. MAKE SURE YOU:   Understand these instructions.  Will watch your condition.  Will get help right away if you are not doing well or get worse. Document Released: 04/27/2007 Document Revised: 03/08/2012 Document Reviewed: 06/28/2007 Exit[employer] Patient Information 2014 [employer], [place].   ________________________________________________________________________  WHAT IS A BLOOD TRANSFUSION? Blood Transfusion Information  A transfusion is the replacement of blood or some of its parts. Blood is made up of multiple cells which provide different functions.  Red blood cells carry oxygen and are used for blood loss replacement.  White blood cells fight against infection.  Platelets control bleeding.  Plasma helps clot blood.  Other blood products are available for specialized needs, such as hemophilia or other clotting disorders. BEFORE THE TRANSFUSION  Who gives blood for transfusions?   Healthy volunteers who are fully evaluated to make sure their blood is safe. This is blood bank blood. Transfusion therapy is the safest it has ever been in the practice of medicine. Before blood is taken  from a donor, a complete history is taken to make sure that person has no history of diseases nor engages in risky social behavior (examples are intravenous drug use or sexual activity with multiple partners). The donor's travel history is screened to minimize risk of transmitting infections, such as malaria. The donated blood is tested for signs of infectious diseases, such as HIV and hepatitis. The blood is then tested to be sure it is compatible with you in order to minimize the chance of a transfusion reaction. If you or a relative donates blood, this is often done in anticipation of surgery and is not appropriate for emergency situations. It takes many days to process the donated blood. RISKS AND COMPLICATIONS Although transfusion therapy is very safe and saves many lives, the main dangers of transfusion include:   Getting an infectious disease.  Developing a transfusion reaction. This is an allergic reaction to something in the blood you were given. Every precaution is taken to prevent  this. The decision to have a blood transfusion has been considered carefully by your caregiver before blood is given. Blood is not given unless the benefits outweigh the risks. AFTER THE TRANSFUSION  Right after receiving a blood transfusion, you will usually feel much better and more energetic. This is especially true if your red blood cells have gotten low (anemic). The transfusion raises the level of the red blood cells which carry oxygen, and this usually causes an energy increase.  The nurse administering the transfusion will monitor you carefully for complications. HOME CARE INSTRUCTIONS  No special instructions are needed after a transfusion. You may find your energy is better. Speak with your caregiver about any limitations on activity for underlying diseases you may have. SEEK MEDICAL CARE IF:   Your condition is not improving after your transfusion.  You develop redness or irritation at the  intravenous (IV) site. SEEK IMMEDIATE MEDICAL CARE IF:  Any of the following symptoms occur over the next 12 hours:  Shaking chills.  You have a temperature by mouth above 102 F (38.9 C), not controlled by medicine.  Chest, back, or muscle pain.  People around you feel you are not acting correctly or are confused.  Shortness of breath or difficulty breathing.  Dizziness and fainting.  You get a rash or develop hives.  You have a decrease in urine output.  Your urine turns a dark color or changes to pink, red, or brown. Any of the following symptoms occur over the next 10 days:  You have a temperature by mouth above 102 F (38.9 C), not controlled by medicine.  Shortness of breath.  Weakness after normal activity.  The white part of the eye turns yellow (jaundice).  You have a decrease in the amount of urine or are urinating less often.  Your urine turns a dark color or changes to pink, red, or brown. Document Released: 12/12/2000 Document Revised: 03/08/2012 Document Reviewed: 07/31/2008 [facility] Patient Information 2014 [place], [place].  _______________________________________________________________________

## 2015-01-03 ENCOUNTER — Ambulatory Visit (HOSPITAL_COMMUNITY)
Admission: RE | Admit: 2015-01-03 | Discharge: 2015-01-03 | Disposition: A | Discharge status: Home / Self Care | Payer: Commercial Managed Care - HMO | Source: Ambulatory Visit | Attending: Anesthesiology | Admitting: Anesthesiology

## 2015-01-03 ENCOUNTER — Encounter (HOSPITAL_COMMUNITY)
Admission: RE | Admit: 2015-01-03 | Discharge: 2015-01-03 | Disposition: A | Discharge status: Home / Self Care | Payer: Commercial Managed Care - HMO | Source: Ambulatory Visit | Attending: Orthopedic Surgery | Admitting: Orthopedic Surgery

## 2015-01-03 ENCOUNTER — Encounter (HOSPITAL_COMMUNITY): Payer: Self-pay

## 2015-01-03 DIAGNOSIS — I10 Essential (primary) hypertension: Secondary | ICD-10-CM | POA: Insufficient documentation

## 2015-01-03 DIAGNOSIS — Z01818 Encounter for other preprocedural examination: Secondary | ICD-10-CM | POA: Insufficient documentation

## 2015-01-03 DIAGNOSIS — Z01811 Encounter for preprocedural respiratory examination: Secondary | ICD-10-CM | POA: Diagnosis not present

## 2015-01-03 HISTORY — DX: Other specified symptoms and signs involving the digestive system and abdomen: R19.8

## 2015-01-03 HISTORY — DX: Sleep disorder, unspecified: G47.9

## 2015-01-03 HISTORY — DX: Emotional lability: R45.86

## 2015-01-03 LAB — URINALYSIS, ROUTINE W REFLEX MICROSCOPIC
BILIRUBIN URINE: NEGATIVE
Glucose, UA: NEGATIVE mg/dL
Hgb urine dipstick: NEGATIVE
Ketones, ur: NEGATIVE mg/dL
LEUKOCYTES UA: NEGATIVE
Nitrite: NEGATIVE
PH: 7 (ref 5.0–8.0)
Protein, ur: NEGATIVE mg/dL
SPECIFIC GRAVITY, URINE: 1.016 (ref 1.005–1.030)
Urobilinogen, UA: 0.2 mg/dL (ref 0.0–1.0)

## 2015-01-03 LAB — CBC
HEMATOCRIT: 45.1 % (ref 39.0–52.0)
HEMOGLOBIN: 14.2 g/dL (ref 13.0–17.0)
MCH: 30.1 pg (ref 26.0–34.0)
MCHC: 31.5 g/dL (ref 30.0–36.0)
MCV: 95.8 fL (ref 78.0–100.0)
Platelets: 237 10*3/uL (ref 150–400)
RBC: 4.71 MIL/uL (ref 4.22–5.81)
RDW: 14 % (ref 11.5–15.5)
WBC: 4.8 10*3/uL (ref 4.0–10.5)

## 2015-01-03 LAB — BASIC METABOLIC PANEL
ANION GAP: 10 (ref 5–15)
BUN: 14 mg/dL (ref 6–23)
CHLORIDE: 97 meq/L (ref 96–112)
CO2: 29 mmol/L (ref 19–32)
Calcium: 9.6 mg/dL (ref 8.4–10.5)
Creatinine, Ser: 0.72 mg/dL (ref 0.50–1.35)
GFR calc Af Amer: >90 mL/min (ref >90)
GFR calc non Af Amer: >90 mL/min (ref >90)
Glucose, Bld: 101 mg/dL — ABNORMAL HIGH (ref 70–99)
Potassium: 3.5 mmol/L (ref 3.5–5.1)
Sodium: 136 mmol/L (ref 135–145)

## 2015-01-03 LAB — PROTIME-INR
INR: 0.92 (ref 0.00–1.49)
Prothrombin Time: 12.5 seconds (ref 11.6–15.2)

## 2015-01-03 LAB — APTT: APTT: 26 s (ref 24–37)

## 2015-01-03 LAB — ABO/RH: ABO/RH(D): O POS

## 2015-01-03 LAB — SURGICAL PCR SCREEN
MRSA, PCR: NEGATIVE
Staphylococcus aureus: NEGATIVE

## 2015-01-03 NOTE — Progress Notes (Signed)
   01/03/15 1404  OBSTRUCTIVE SLEEP APNEA  Have you ever been diagnosed with sleep apnea through a sleep study? No  Do you snore loudly (loud enough to be heard through closed doors)?  1  Do you often feel tired, fatigued, or sleepy during the daytime? 0  Has anyone observed you stop breathing during your sleep? 0  Do you have, or are you being treated for high blood pressure? 1  BMI more than 35 kg/m2? 0  Age over 70 years old? 1  Neck circumference greater than 40 cm/16 inches? 1  Gender: 1  Obstructive Sleep Apnea Score 5  Score 4 or greater  Results sent to PCP

## 2015-01-07 NOTE — H&P (Signed)
TOTAL KNEE ADMISSION H&P  Patient is being admitted for right total knee arthroplasty.  Subjective:  Chief Complaint:     Right knee primary OA / pain.  HPI: Eugene White, 69 y.o. male, has a history of pain and functional disability in the right knee due to arthritis and has failed non-surgical conservative treatments for greater than 12 weeks to include  NSAID's and/or analgesics, corticosteriod injections, viscosupplementation injections and activity modification.  Onset of symptoms was gradual, starting 50+ years ago with gradually worsening course since that time. The patient noted prior procedures on the knee to include  arthroscopy on the right knee(s).  Patient currently rates pain in the right knee(s) at 9 out of 10 with activity. Patient has worsening of pain with activity and weight bearing, pain that interferes with activities of daily living, pain with passive range of motion, crepitus and joint swelling.  Patient has evidence of periarticular osteophytes and joint space narrowing by imaging studies. There is no active infection.  Risks, benefits and expectations were discussed with the patient.  Risks including but not limited to the risk of anesthesia, blood clots, nerve damage, blood vessel damage, failure of the prosthesis, infection and up to and including death.  Patient understand the risks, benefits and expectations and wishes to proceed with surgery.   PCP: Lelon Huh, MD  D/C Plans:      Home with HHPT  Post-op Meds:       No Rx given  Tranexamic Acid:      To be given - IV    Decadron:      Is to be given  FYI:     ASA post-op  Norco post-op      Patient Active Problem List   Diagnosis Date Noted  . Fever, unspecified 06/02/2014  . Loss of weight 06/02/2014  . Leukocytosis, unspecified 06/02/2014  . Abdominal pain, unspecified site 06/02/2014  . Anemia 06/02/2014   Past Medical History  Diagnosis Date  . GERD (gastroesophageal reflux disease)   .  Hyperlipidemia   . Hypertension   . Thyroid disease     hypo  . 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 Diverticulosis 2014  . Anemia   . Arthritis     OA  . GI symptom     had nausea / vomited once / frequent stools / getting better  . Difficulty sleeping   . Mood changes     Past Surgical History  Procedure Laterality Date  . Knee arthroscopy  1984 & 2001    twice  . Shoulder open rotator cuff repair  1999 & 2006    Right and left  . Hand surgery   1973 / 2010 / 2015    right to release tendons  . Colonoscopy  2014  . Nasal septum surgery      No prescriptions prior to admission   Allergies  Allergen Reactions  . Sulfa Antibiotics Other (See Comments)    unknown    History  Substance Use Topics  . Smoking status: Former Smoker    Quit date: 12/29/1968  . Smokeless tobacco: Never Used  . Alcohol Use: 6.0 oz/week    10 Cans of beer per week    Family History  Problem Relation Age of Onset  . Breast cancer Mother   . Colon cancer Neg Hx   . Esophageal cancer Neg Hx   . Stomach cancer Neg Hx   .  Rectal cancer Neg Hx      Review of Systems  Constitutional: Negative.   HENT: Negative.   Eyes: Negative.   Respiratory: Negative.   Cardiovascular: Negative.   Gastrointestinal: Positive for heartburn.  Genitourinary: Negative.   Musculoskeletal: Positive for joint pain.  Skin: Negative.   Neurological: Negative.   Endo/Heme/Allergies: Negative.   Psychiatric/Behavioral: Negative.     Objective:  Physical Exam  Constitutional: He is oriented to person, place, and time. He appears well-developed and well-nourished.  HENT:  Head: Normocephalic and atraumatic.  Eyes: Pupils are equal, round, and reactive to light.  Neck: Neck supple. No JVD present. No tracheal deviation present. No thyromegaly present.  Cardiovascular: Normal rate, regular rhythm, normal heart sounds and intact distal pulses.   Respiratory: Effort normal and  breath sounds normal. No stridor. No respiratory distress. He has no wheezes.  GI: Soft. There is no tenderness. There is no guarding.  Musculoskeletal:       Right knee: He exhibits decreased range of motion, swelling, abnormal alignment and bony tenderness. He exhibits no ecchymosis, no deformity, no laceration and no erythema. Tenderness found.  Lymphadenopathy:    He has no cervical adenopathy.  Neurological: He is alert and oriented to person, place, and time.  Skin: Skin is warm and dry.  Psychiatric: He has a normal mood and affect.      Labs:  Estimated body mass index is 31.67 kg/(m^2) as calculated from the following:   Height as of 06/09/14: 5\' 11"  (1.803 m).   Weight as of 06/09/14: 102.967 kg (227 lb).   Imaging Review Plain radiographs demonstrate severe degenerative joint disease of the right knee(s). The overall alignment is  mild varus. The bone quality appears to be good for age and reported activity level.  Assessment/Plan:  End stage arthritis, right knee   The patient history, physical examination, clinical judgment of the provider and imaging studies are consistent with end stage degenerative joint disease of the right knee(s) and total knee arthroplasty is deemed medically necessary. The treatment options including medical management, injection therapy arthroscopy and arthroplasty were discussed at length. The risks and benefits of total knee arthroplasty were presented and reviewed. The risks due to aseptic loosening, infection, stiffness, patella tracking problems, thromboembolic complications and other imponderables were discussed. The patient acknowledged the explanation, agreed to proceed with the plan and consent was signed. Patient is being admitted for inpatient treatment for surgery, pain control, PT, OT, prophylactic antibiotics, VTE prophylaxis, progressive ambulation and ADL's and discharge planning. The patient is planning to be discharged home with home  health services.    Eugene Pugh Johnmark Geiger   PA-C  01/07/2015, 8:45 PM

## 2015-01-09 ENCOUNTER — Encounter (HOSPITAL_COMMUNITY)
Admission: RE | Disposition: A | Discharge status: Home / Self Care | Payer: Self-pay | Source: Ambulatory Visit | Attending: Orthopedic Surgery

## 2015-01-09 ENCOUNTER — Inpatient Hospital Stay (HOSPITAL_COMMUNITY)
Admission: RE | Admit: 2015-01-09 | Discharge: 2015-01-10 | DRG: 470 | Disposition: A | Discharge status: Home / Self Care | Payer: Commercial Managed Care - HMO | Source: Ambulatory Visit | Attending: Orthopedic Surgery | Admitting: Orthopedic Surgery

## 2015-01-09 ENCOUNTER — Encounter (HOSPITAL_COMMUNITY): Payer: Self-pay | Admitting: *Deleted

## 2015-01-09 ENCOUNTER — Inpatient Hospital Stay (HOSPITAL_COMMUNITY): Payer: Commercial Managed Care - HMO | Admitting: Anesthesiology

## 2015-01-09 DIAGNOSIS — M1710 Unilateral primary osteoarthritis, unspecified knee: Secondary | ICD-10-CM | POA: Insufficient documentation

## 2015-01-09 DIAGNOSIS — Z87891 Personal history of nicotine dependence: Secondary | ICD-10-CM

## 2015-01-09 DIAGNOSIS — M659 Synovitis and tenosynovitis, unspecified: Secondary | ICD-10-CM | POA: Diagnosis not present

## 2015-01-09 DIAGNOSIS — I10 Essential (primary) hypertension: Secondary | ICD-10-CM | POA: Diagnosis not present

## 2015-01-09 DIAGNOSIS — E785 Hyperlipidemia, unspecified: Secondary | ICD-10-CM | POA: Diagnosis present

## 2015-01-09 DIAGNOSIS — Z882 Allergy status to sulfonamides status: Secondary | ICD-10-CM | POA: Diagnosis not present

## 2015-01-09 DIAGNOSIS — Z96651 Presence of right artificial knee joint: Secondary | ICD-10-CM

## 2015-01-09 DIAGNOSIS — D649 Anemia, unspecified: Secondary | ICD-10-CM | POA: Diagnosis present

## 2015-01-09 DIAGNOSIS — K219 Gastro-esophageal reflux disease without esophagitis: Secondary | ICD-10-CM | POA: Diagnosis present

## 2015-01-09 DIAGNOSIS — M1711 Unilateral primary osteoarthritis, right knee: Secondary | ICD-10-CM | POA: Diagnosis not present

## 2015-01-09 DIAGNOSIS — Z96659 Presence of unspecified artificial knee joint: Secondary | ICD-10-CM

## 2015-01-09 DIAGNOSIS — M25561 Pain in right knee: Secondary | ICD-10-CM | POA: Diagnosis present

## 2015-01-09 DIAGNOSIS — M179 Osteoarthritis of knee, unspecified: Secondary | ICD-10-CM | POA: Diagnosis not present

## 2015-01-09 DIAGNOSIS — G479 Sleep disorder, unspecified: Secondary | ICD-10-CM | POA: Diagnosis not present

## 2015-01-09 DIAGNOSIS — Z9660 Presence of unspecified orthopedic joint implant: Secondary | ICD-10-CM | POA: Insufficient documentation

## 2015-01-09 DIAGNOSIS — E079 Disorder of thyroid, unspecified: Secondary | ICD-10-CM | POA: Diagnosis not present

## 2015-01-09 DIAGNOSIS — F1010 Alcohol abuse, uncomplicated: Secondary | ICD-10-CM | POA: Diagnosis present

## 2015-01-09 DIAGNOSIS — R2689 Other abnormalities of gait and mobility: Secondary | ICD-10-CM | POA: Diagnosis not present

## 2015-01-09 HISTORY — PX: TOTAL KNEE ARTHROPLASTY: SHX125

## 2015-01-09 LAB — TYPE AND SCREEN
ABO/RH(D): O POS
Antibody Screen: NEGATIVE

## 2015-01-09 SURGERY — ARTHROPLASTY, KNEE, TOTAL
Anesthesia: Spinal | Site: Knee | Laterality: Right

## 2015-01-09 MED ORDER — PROPOFOL 10 MG/ML IV BOLUS
INTRAVENOUS | Status: AC
  Filled 2015-01-09: qty 20

## 2015-01-09 MED ORDER — BISACODYL 10 MG RE SUPP: 10.0000 mg | Freq: Every day | RECTAL | Status: DC | PRN

## 2015-01-09 MED ORDER — ALUM & MAG HYDROXIDE-SIMETH 200-200-20 MG/5ML PO SUSP
30.0000 mL | ORAL | Status: DC | PRN
Start: 2015-01-09 — End: 2015-01-10

## 2015-01-09 MED ORDER — LACTATED RINGERS IV SOLN
INTRAVENOUS | Status: DC | PRN
  Administered 2015-01-09 (×3): via INTRAVENOUS

## 2015-01-09 MED ORDER — ASPIRIN EC 325 MG PO TBEC
325.0000 mg | DELAYED_RELEASE_TABLET | Freq: Two times a day (BID) | ORAL | Status: DC
  Administered 2015-01-09 – 2015-01-10 (×2): 325 mg via ORAL
  Filled 2015-01-09 (×4): qty 1

## 2015-01-09 MED ORDER — DEXAMETHASONE SODIUM PHOSPHATE 10 MG/ML IJ SOLN
INTRAMUSCULAR | Status: DC | PRN
  Administered 2015-01-09: 10 mg via INTRAVENOUS

## 2015-01-09 MED ORDER — BUPIVACAINE-EPINEPHRINE (PF) 0.25% -1:200000 IJ SOLN
INTRAMUSCULAR | Status: DC | PRN
  Administered 2015-01-09: 30 mL

## 2015-01-09 MED ORDER — HYDROCODONE-ACETAMINOPHEN 7.5-325 MG PO TABS: 1.0000 | ORAL_TABLET | ORAL | Status: DC | PRN

## 2015-01-09 MED ORDER — METHOCARBAMOL 500 MG PO TABS
500.0000 mg | ORAL_TABLET | Freq: Four times a day (QID) | ORAL | Status: DC | PRN
  Administered 2015-01-09 – 2015-01-10 (×2): 500 mg via ORAL
  Filled 2015-01-09 (×2): qty 1

## 2015-01-09 MED ORDER — METHOCARBAMOL 1000 MG/10ML IJ SOLN
500.0000 mg | Freq: Four times a day (QID) | INTRAVENOUS | Status: DC | PRN
  Filled 2015-01-09: qty 5

## 2015-01-09 MED ORDER — 0.9 % SODIUM CHLORIDE (POUR BTL) OPTIME
TOPICAL | Status: DC | PRN
  Administered 2015-01-09: 1000 mL

## 2015-01-09 MED ORDER — HYDROMORPHONE HCL 1 MG/ML IJ SOLN: 0.2500 mg | INTRAMUSCULAR | Status: DC | PRN

## 2015-01-09 MED ORDER — MIDAZOLAM HCL 5 MG/5ML IJ SOLN
INTRAMUSCULAR | Status: DC | PRN
  Administered 2015-01-09 (×2): 1 mg via INTRAVENOUS

## 2015-01-09 MED ORDER — PROPOFOL INFUSION 10 MG/ML OPTIME
INTRAVENOUS | Status: DC | PRN
  Administered 2015-01-09: 120 ug/kg/min via INTRAVENOUS

## 2015-01-09 MED ORDER — KETOROLAC TROMETHAMINE 30 MG/ML IJ SOLN
INTRAMUSCULAR | Status: AC
  Filled 2015-01-09: qty 1

## 2015-01-09 MED ORDER — MIDAZOLAM HCL 2 MG/2ML IJ SOLN
INTRAMUSCULAR | Status: AC
  Filled 2015-01-09: qty 2

## 2015-01-09 MED ORDER — TRANEXAMIC ACID 100 MG/ML IV SOLN
1000.0000 mg | INTRAVENOUS | Status: AC
  Administered 2015-01-09: 1000 mg via INTRAVENOUS
  Filled 2015-01-09: qty 10

## 2015-01-09 MED ORDER — MENTHOL 3 MG MT LOZG
1.0000 | LOZENGE | OROMUCOSAL | Status: DC | PRN
  Filled 2015-01-09: qty 9

## 2015-01-09 MED ORDER — TIZANIDINE HCL 4 MG PO TABS: 4.0000 mg | ORAL_TABLET | Freq: Four times a day (QID) | ORAL | Status: DC | PRN

## 2015-01-09 MED ORDER — FENTANYL CITRATE 0.05 MG/ML IJ SOLN
INTRAMUSCULAR | Status: AC
  Filled 2015-01-09: qty 2

## 2015-01-09 MED ORDER — HYDROCHLOROTHIAZIDE 25 MG PO TABS
25.0000 mg | ORAL_TABLET | Freq: Every day | ORAL | Status: DC
  Administered 2015-01-10: 25 mg via ORAL
  Filled 2015-01-09 (×2): qty 1

## 2015-01-09 MED ORDER — IRBESARTAN 150 MG PO TABS
150.0000 mg | ORAL_TABLET | Freq: Every day | ORAL | Status: DC
  Administered 2015-01-10: 150 mg via ORAL
  Filled 2015-01-09 (×2): qty 1

## 2015-01-09 MED ORDER — SODIUM CHLORIDE 0.9 % IR SOLN
Status: DC | PRN
  Administered 2015-01-09: 1000 mL

## 2015-01-09 MED ORDER — POLYETHYLENE GLYCOL 3350 17 G PO PACK: 17.0000 g | PACK | Freq: Every day | ORAL | Status: DC | PRN

## 2015-01-09 MED ORDER — MAGNESIUM CITRATE PO SOLN: 1.0000 | Freq: Once | ORAL | Status: AC | PRN

## 2015-01-09 MED ORDER — METOCLOPRAMIDE HCL 5 MG/ML IJ SOLN: 5.0000 mg | Freq: Three times a day (TID) | INTRAMUSCULAR | Status: DC | PRN

## 2015-01-09 MED ORDER — PROMETHAZINE HCL 25 MG/ML IJ SOLN: 6.2500 mg | INTRAMUSCULAR | Status: DC | PRN

## 2015-01-09 MED ORDER — SODIUM CHLORIDE 0.9 % IJ SOLN
INTRAMUSCULAR | Status: DC | PRN
  Administered 2015-01-09: 30 mL

## 2015-01-09 MED ORDER — VENLAFAXINE HCL ER 75 MG PO CP24
75.0000 mg | ORAL_CAPSULE | Freq: Every day | ORAL | Status: DC
  Administered 2015-01-10: 75 mg via ORAL
  Filled 2015-01-09: qty 1

## 2015-01-09 MED ORDER — DEXAMETHASONE SODIUM PHOSPHATE 10 MG/ML IJ SOLN
INTRAMUSCULAR | Status: AC
  Filled 2015-01-09: qty 1

## 2015-01-09 MED ORDER — PHENOL 1.4 % MT LIQD: 1.0000 | OROMUCOSAL | Status: DC | PRN

## 2015-01-09 MED ORDER — EPHEDRINE SULFATE 50 MG/ML IJ SOLN
INTRAMUSCULAR | Status: DC | PRN
  Administered 2015-01-09 (×3): 10 mg via INTRAVENOUS

## 2015-01-09 MED ORDER — CEFAZOLIN SODIUM-DEXTROSE 2-3 GM-% IV SOLR
2.0000 g | INTRAVENOUS | Status: AC
  Administered 2015-01-09: 2 g via INTRAVENOUS

## 2015-01-09 MED ORDER — ONDANSETRON HCL 4 MG/2ML IJ SOLN
INTRAMUSCULAR | Status: DC | PRN
  Administered 2015-01-09: 4 mg via INTRAVENOUS

## 2015-01-09 MED ORDER — CHLORHEXIDINE GLUCONATE 4 % EX LIQD: 60.0000 mL | Freq: Once | CUTANEOUS | Status: DC

## 2015-01-09 MED ORDER — SODIUM CHLORIDE 0.9 % IV SOLN
INTRAVENOUS | Status: DC
  Administered 2015-01-09 – 2015-01-10 (×2): via INTRAVENOUS
  Filled 2015-01-09 (×6): qty 1000

## 2015-01-09 MED ORDER — ONDANSETRON HCL 4 MG/2ML IJ SOLN
INTRAMUSCULAR | Status: AC
  Filled 2015-01-09: qty 2

## 2015-01-09 MED ORDER — VALSARTAN-HYDROCHLOROTHIAZIDE 160-25 MG PO TABS: 1.0000 | ORAL_TABLET | Freq: Every day | ORAL | Status: DC

## 2015-01-09 MED ORDER — SODIUM CHLORIDE 0.9 % IJ SOLN
INTRAMUSCULAR | Status: AC
  Filled 2015-01-09: qty 10

## 2015-01-09 MED ORDER — DSS 100 MG PO CAPS: 100.0000 mg | ORAL_CAPSULE | Freq: Two times a day (BID) | ORAL | Status: DC

## 2015-01-09 MED ORDER — HYDROMORPHONE HCL 1 MG/ML IJ SOLN
0.5000 mg | INTRAMUSCULAR | Status: DC | PRN
  Administered 2015-01-09: 0.5 mg via INTRAVENOUS
  Filled 2015-01-09: qty 1

## 2015-01-09 MED ORDER — DIPHENHYDRAMINE HCL 25 MG PO CAPS
25.0000 mg | ORAL_CAPSULE | Freq: Four times a day (QID) | ORAL | Status: DC | PRN
  Administered 2015-01-09: 25 mg via ORAL
  Filled 2015-01-09: qty 1

## 2015-01-09 MED ORDER — EPHEDRINE SULFATE 50 MG/ML IJ SOLN
INTRAMUSCULAR | Status: AC
Start: 2015-01-09 — End: 2015-01-09
  Filled 2015-01-09: qty 1

## 2015-01-09 MED ORDER — SODIUM CHLORIDE 0.9 % IJ SOLN
INTRAMUSCULAR | Status: AC
  Filled 2015-01-09: qty 50

## 2015-01-09 MED ORDER — ONDANSETRON HCL 4 MG PO TABS: 4.0000 mg | ORAL_TABLET | Freq: Four times a day (QID) | ORAL | Status: DC | PRN

## 2015-01-09 MED ORDER — AMLODIPINE BESYLATE 5 MG PO TABS
5.0000 mg | ORAL_TABLET | Freq: Every day | ORAL | Status: DC
  Administered 2015-01-10: 5 mg via ORAL
  Filled 2015-01-09: qty 1

## 2015-01-09 MED ORDER — HYDROCODONE-ACETAMINOPHEN 7.5-325 MG PO TABS
1.0000 | ORAL_TABLET | ORAL | Status: DC
  Administered 2015-01-09: 2 via ORAL
  Administered 2015-01-09 (×2): 1 via ORAL
  Administered 2015-01-10 (×4): 2 via ORAL
  Filled 2015-01-09 (×3): qty 2
  Filled 2015-01-09: qty 1
  Filled 2015-01-09: qty 2
  Filled 2015-01-09: qty 1
  Filled 2015-01-09: qty 2

## 2015-01-09 MED ORDER — CEFAZOLIN SODIUM-DEXTROSE 2-3 GM-% IV SOLR
2.0000 g | Freq: Four times a day (QID) | INTRAVENOUS | Status: AC
  Administered 2015-01-09 (×2): 2 g via INTRAVENOUS
  Filled 2015-01-09 (×2): qty 50

## 2015-01-09 MED ORDER — KETOROLAC TROMETHAMINE 30 MG/ML IJ SOLN
INTRAMUSCULAR | Status: DC | PRN
  Administered 2015-01-09: 30 mg

## 2015-01-09 MED ORDER — METOCLOPRAMIDE HCL 10 MG PO TABS: 5.0000 mg | ORAL_TABLET | Freq: Three times a day (TID) | ORAL | Status: DC | PRN

## 2015-01-09 MED ORDER — CEFAZOLIN SODIUM-DEXTROSE 2-3 GM-% IV SOLR
INTRAVENOUS | Status: AC
  Filled 2015-01-09: qty 50

## 2015-01-09 MED ORDER — FERROUS SULFATE 325 (65 FE) MG PO TABS: 325.0000 mg | ORAL_TABLET | Freq: Three times a day (TID) | ORAL | Status: DC

## 2015-01-09 MED ORDER — ZOLPIDEM TARTRATE 5 MG PO TABS: 5.0000 mg | ORAL_TABLET | Freq: Every evening | ORAL | Status: DC | PRN

## 2015-01-09 MED ORDER — DOCUSATE SODIUM 100 MG PO CAPS
100.0000 mg | ORAL_CAPSULE | Freq: Two times a day (BID) | ORAL | Status: DC
Start: 2015-01-09 — End: 2015-01-10
  Administered 2015-01-09 – 2015-01-10 (×2): 100 mg via ORAL

## 2015-01-09 MED ORDER — FERROUS SULFATE 325 (65 FE) MG PO TABS
325.0000 mg | ORAL_TABLET | Freq: Three times a day (TID) | ORAL | Status: DC
  Filled 2015-01-09 (×5): qty 1

## 2015-01-09 MED ORDER — LEVOTHYROXINE SODIUM 150 MCG PO TABS
150.0000 ug | ORAL_TABLET | Freq: Every day | ORAL | Status: DC
  Administered 2015-01-10: 150 ug via ORAL
  Filled 2015-01-09 (×2): qty 1

## 2015-01-09 MED ORDER — PANTOPRAZOLE SODIUM 40 MG PO TBEC
40.0000 mg | DELAYED_RELEASE_TABLET | Freq: Every day | ORAL | Status: DC
  Administered 2015-01-10: 40 mg via ORAL
  Filled 2015-01-09 (×2): qty 1

## 2015-01-09 MED ORDER — DEXAMETHASONE SODIUM PHOSPHATE 10 MG/ML IJ SOLN
10.0000 mg | Freq: Once | INTRAMUSCULAR | Status: DC
Start: 2015-01-10 — End: 2015-01-10
  Filled 2015-01-09: qty 1

## 2015-01-09 MED ORDER — ONDANSETRON HCL 4 MG/2ML IJ SOLN: 4.0000 mg | Freq: Four times a day (QID) | INTRAMUSCULAR | Status: DC | PRN

## 2015-01-09 MED ORDER — FENTANYL CITRATE 0.05 MG/ML IJ SOLN
INTRAMUSCULAR | Status: DC | PRN
  Administered 2015-01-09 (×2): 50 ug via INTRAVENOUS

## 2015-01-09 MED ORDER — ASPIRIN 325 MG PO TBEC: 325.0000 mg | DELAYED_RELEASE_TABLET | Freq: Two times a day (BID) | ORAL | Status: AC

## 2015-01-09 SURGICAL SUPPLY — 54 items
BAG ZIPLOCK 12X15 (MISCELLANEOUS) IMPLANT
BANDAGE ELASTIC 6 VELCRO ST LF (GAUZE/BANDAGES/DRESSINGS) ×3 IMPLANT
BANDAGE ESMARK 6X9 LF (GAUZE/BANDAGES/DRESSINGS) ×1 IMPLANT
BLADE SAW SGTL 13.0X1.19X90.0M (BLADE) ×3 IMPLANT
BNDG ESMARK 6X9 LF (GAUZE/BANDAGES/DRESSINGS) ×3
BOWL SMART MIX CTS (DISPOSABLE) ×3 IMPLANT
CAP KNEE TOTAL 3 SIGMA ×3 IMPLANT
CEMENT HV SMART SET (Cement) ×6 IMPLANT
CUFF TOURN SGL QUICK 34 (TOURNIQUET CUFF) ×2
CUFF TRNQT CYL 34X4X40X1 (TOURNIQUET CUFF) ×1 IMPLANT
DECANTER SPIKE VIAL GLASS SM (MISCELLANEOUS) ×3 IMPLANT
DRAPE EXTREMITY T 121X128X90 (DRAPE) ×3 IMPLANT
DRAPE POUCH INSTRU U-SHP 10X18 (DRAPES) ×3 IMPLANT
DRAPE U-SHAPE 47X51 STRL (DRAPES) ×3 IMPLANT
DRSG AQUACEL AG ADV 3.5X10 (GAUZE/BANDAGES/DRESSINGS) ×3 IMPLANT
DURAPREP 26ML APPLICATOR (WOUND CARE) ×6 IMPLANT
ELECT REM PT RETURN 9FT ADLT (ELECTROSURGICAL) ×3
ELECTRODE REM PT RTRN 9FT ADLT (ELECTROSURGICAL) ×1 IMPLANT
FACESHIELD WRAPAROUND (MASK) ×12 IMPLANT
GLOVE BIO SURGEON STRL SZ7 (GLOVE) ×3 IMPLANT
GLOVE BIOGEL M 7.0 STRL (GLOVE) ×3 IMPLANT
GLOVE BIOGEL PI IND STRL 6.5 (GLOVE) ×1 IMPLANT
GLOVE BIOGEL PI IND STRL 7.0 (GLOVE) ×1 IMPLANT
GLOVE BIOGEL PI IND STRL 7.5 (GLOVE) ×3 IMPLANT
GLOVE BIOGEL PI INDICATOR 6.5 (GLOVE) ×2
GLOVE BIOGEL PI INDICATOR 7.0 (GLOVE) ×2
GLOVE BIOGEL PI INDICATOR 7.5 (GLOVE) ×6
GLOVE ECLIPSE 7.5 STRL STRAW (GLOVE) ×3 IMPLANT
GLOVE ECLIPSE 8.0 STRL XLNG CF (GLOVE) ×3 IMPLANT
GLOVE ORTHO TXT STRL SZ7.5 (GLOVE) ×6 IMPLANT
GOWN STRL REUS W/TWL LRG LVL3 (GOWN DISPOSABLE) ×6 IMPLANT
GOWN STRL REUS W/TWL XL LVL3 (GOWN DISPOSABLE) ×6 IMPLANT
HANDPIECE INTERPULSE COAX TIP (DISPOSABLE) ×2
KIT BASIN OR (CUSTOM PROCEDURE TRAY) ×3 IMPLANT
LIQUID BAND (GAUZE/BANDAGES/DRESSINGS) ×3 IMPLANT
MANIFOLD NEPTUNE II (INSTRUMENTS) ×3 IMPLANT
NDL SAFETY ECLIPSE 18X1.5 (NEEDLE) ×1 IMPLANT
NEEDLE HYPO 18GX1.5 SHARP (NEEDLE) ×2
PACK TOTAL JOINT (CUSTOM PROCEDURE TRAY) ×3 IMPLANT
POSITIONER SURGICAL ARM (MISCELLANEOUS) ×3 IMPLANT
SET HNDPC FAN SPRY TIP SCT (DISPOSABLE) ×1 IMPLANT
SET PAD KNEE POSITIONER (MISCELLANEOUS) ×3 IMPLANT
SUCTION FRAZIER 12FR DISP (SUCTIONS) ×3 IMPLANT
SUT MNCRL AB 4-0 PS2 18 (SUTURE) ×3 IMPLANT
SUT VIC AB 1 CT1 36 (SUTURE) ×3 IMPLANT
SUT VIC AB 2-0 CT1 27 (SUTURE) ×6
SUT VIC AB 2-0 CT1 TAPERPNT 27 (SUTURE) ×3 IMPLANT
SUT VLOC 180 0 24IN GS25 (SUTURE) ×3 IMPLANT
SYR 50ML LL SCALE MARK (SYRINGE) ×3 IMPLANT
TOWEL OR 17X26 10 PK STRL BLUE (TOWEL DISPOSABLE) ×3 IMPLANT
TOWEL OR NON WOVEN STRL DISP B (DISPOSABLE) ×3 IMPLANT
TRAY FOLEY CATH 16FRSI W/METER (SET/KITS/TRAYS/PACK) ×3 IMPLANT
WATER STERILE IRR 1500ML POUR (IV SOLUTION) ×3 IMPLANT
WRAP KNEE MAXI GEL POST OP (GAUZE/BANDAGES/DRESSINGS) ×3 IMPLANT

## 2015-01-09 NOTE — Interval H&P Note (Signed)
History and Physical Interval Note:  01/09/2015 6:51 AM  Eugene White  has presented today for surgery, with the diagnosis of RIGHT KNEE OA  The various methods of treatment have been discussed with the patient and family. After consideration of risks, benefits and other options for treatment, the patient has consented to  Procedure(s): RIGHT TOTAL KNEE ARTHROPLASTY (Right) as a surgical intervention .  The patient's history has been reviewed, patient examined, no change in status, stable for surgery.  I have reviewed the patient's chart and labs.  Questions were answered to the patient's satisfaction.     [name]

## 2015-01-09 NOTE — Evaluation (Signed)
Physical Therapy Evaluation Patient Details Name: Eugene White MRN: [id number] DOB: 1946/11/20 Today's Date: 01/09/2015   History of Present Illness  Pt is a 69 year old male s/p R TKA  Clinical Impression  Pt is s/p R TKA resulting in the deficits listed below (see PT Problem List).  Pt will benefit from skilled PT to increase their independence and safety with mobility to allow discharge to the venue listed below.  Pt mobilizing well POD #0 and able to tolerate ambulating short distance in hallway.  Pt plans to d/c home with spouse.     Follow Up Recommendations Home health PT    Equipment Recommendations  Rolling walker with 5" wheels;3in1 (PT)    Recommendations for Other Services       Precautions / Restrictions Precautions Precautions: Knee Restrictions Other Position/Activity Restrictions: WBAT      Mobility  Bed Mobility               General bed mobility comments: pt sitting EOB with NT on arrival  Transfers Overall transfer level: Needs assistance Equipment used: Rolling walker (2 wheeled) Transfers: Sit to/from Stand Sit to Stand: Min assist         General transfer comment: assist to steady and control descent, verbal cues for UE and LE positioning  Ambulation/Gait Ambulation/Gait assistance: Min assist Ambulation Distance (Feet): 80 Feet Assistive device: Rolling walker (2 wheeled) Gait Pattern/deviations: Step-to pattern;Antalgic     General Gait Details: verbal cues for sequence, step length, pace, pt reports he tends to try to do too much at times stating he may need reminders to take it easy  Stairs            Wheelchair Mobility    Modified Rankin (Stroke Patients Only)       Balance                                             Pertinent Vitals/Pain Pain Assessment: 0-10 Pain Score: 7  Pain Location: R knee and thigh with weight bearing, reports none at rest Pain Descriptors / Indicators:  Sore;Aching Pain Intervention(s): Repositioned;Ice applied;Monitored during session;Limited activity within patient's tolerance    Home Living Family/patient expects to be discharged to:: Private residence Living Arrangements: Spouse/significant other   Type of Home: House Home Access: Stairs to enter Entrance Stairs-Rails: None Entrance Stairs-Number of Steps: 2 Home Layout: Two level;Able to live on main level with bedroom/bathroom (split) Home Equipment: None      Prior Function Level of Independence: Independent               Hand Dominance        Extremity/Trunk Assessment               Lower Extremity Assessment: RLE deficits/detail RLE Deficits / Details: observed at least 50* functional knee flexion with pt sitting EOB       Communication   Communication: No difficulties  Cognition Arousal/Alertness: Awake/alert Behavior During Therapy: WFL for tasks assessed/performed Overall Cognitive Status: Within Functional Limits for tasks assessed                      General Comments      Exercises        Assessment/Plan    PT Assessment Patient needs continued PT services  PT Diagnosis Difficulty walking   PT  Problem List Decreased strength;Decreased range of motion;Decreased mobility;Pain;Decreased knowledge of use of DME  PT Treatment Interventions Functional mobility training;Gait training;DME instruction;Stair training;Patient/family education;Therapeutic activities;Therapeutic exercise   PT Goals (Current goals can be found in the Care Plan section) Acute Rehab PT Goals PT Goal Formulation: With patient Time For Goal Achievement: 01/13/15 Potential to Achieve Goals: Good    Frequency 7X/week   Barriers to discharge        Co-evaluation               End of Session   Activity Tolerance: Patient tolerated treatment well Patient left: in chair;with call bell/phone within reach;with family/visitor present            Time: 7357-8978 PT Time Calculation (min) (ACUTE ONLY): 14 min   Charges:   PT Evaluation $Initial PT Evaluation Tier I: 1 Procedure PT Treatments $Gait Training: 8-22 mins   PT G Codes:        Nani Ingram,KATHrine E 01/09/2015, 3:56 PM Carmelia Bake, PT, DPT 01/09/2015 Pager: [phone]

## 2015-01-09 NOTE — Anesthesia Preprocedure Evaluation (Addendum)
Anesthesia Evaluation  Patient identified by MRN, date of birth, ID band Patient awake    Reviewed: Allergy & Precautions, NPO status , Patient's Chart, lab work & pertinent test results  Airway Mallampati: II  TM Distance: >3 FB Neck ROM: Full    Dental no notable dental hx.    Pulmonary former smoker,  CXR: no acute disease. breath sounds clear to auscultation  Pulmonary exam normal       Cardiovascular Exercise Tolerance: Good hypertension, Pt. 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 negative psych ROS   GI/Hepatic Neg liver ROS, GERD-  Medicated,  Endo/Other  negative endocrine ROS  Renal/GU negative Renal ROS  negative genitourinary   Musculoskeletal  (+) Arthritis -,   Abdominal   Peds negative pediatric ROS (+)  Hematology  (+) anemia ,   Anesthesia Other Findings   Reproductive/Obstetrics negative OB ROS                            Anesthesia Physical Anesthesia Plan  ASA: II  Anesthesia Plan: Spinal   Post-op Pain Management:    Induction: Intravenous  Airway Management Planned:   Additional Equipment:   Intra-op Plan:   Post-operative Plan:   Informed Consent: I have reviewed the patients History and Physical, chart, labs and discussed the procedure including the risks, benefits and alternatives for the proposed anesthesia with the patient or authorized representative who has indicated his/her understanding and acceptance.   Dental advisory given  Plan Discussed with: CRNA  Anesthesia Plan Comments: (Discussed spinal and general. Discussed risks/benefits of spinal including headache, backache, failure, bleeding, infection, and nerve damage. Patient consents to spinal. Questions answered. Coagulation studies and platelet count acceptable.)       Anesthesia Quick Evaluation

## 2015-01-09 NOTE — Anesthesia Postprocedure Evaluation (Signed)
  Anesthesia Post-op Note  Patient: Eugene White  Procedure(s) Performed: Procedure(s) (LRB): RIGHT TOTAL KNEE ARTHROPLASTY (Right)  Patient Location: PACU  Anesthesia Type: Spinal  Level of Consciousness: awake and alert   Airway and Oxygen Therapy: Patient Spontanous Breathing  Post-op Pain: mild  Post-op Assessment: Post-op Vital signs reviewed, Patient's Cardiovascular Status Stable, Respiratory Function Stable, Patent Airway and No signs of Nausea or vomiting  Last Vitals:  Filed Vitals:   01/09/15 1405  BP: 135/80  Pulse: 70  Temp: 36.6 C  Resp: 15    Post-op Vital Signs: stable   Complications: No apparent anesthesia complications

## 2015-01-09 NOTE — Plan of Care (Signed)
Problem: Consults Goal: Diagnosis- Total Joint Replacement Right total knee     

## 2015-01-09 NOTE — Transfer of Care (Signed)
Immediate Anesthesia Transfer of Care Note  Patient: Eugene White  Procedure(s) Performed: Procedure(s): RIGHT TOTAL KNEE ARTHROPLASTY (Right)  Patient Location: PACU  Anesthesia Type:MAC and Spinal  Level of Consciousness: awake, alert , oriented and patient cooperative  Airway & Oxygen Therapy: Patient Spontanous Breathing and Patient connected to face mask oxygen  Post-op Assessment: Report given to PACU RN and Post -op Vital signs reviewed and stable  Post vital signs: Reviewed and stable  Complications: No apparent anesthesia complications

## 2015-01-09 NOTE — Progress Notes (Signed)
Utilization review completed.  

## 2015-01-09 NOTE — Op Note (Signed)
NAME:  Eugene White                      MEDICAL RECORD NO.:  [id number]                             FACILITY:  [facility]      PHYSICIAN:  Pietro Cassis. [name], M.D.  DATE OF BIRTH:  [birth year]      DATE OF PROCEDURE:  01/09/2015                                     OPERATIVE REPORT         PREOPERATIVE DIAGNOSIS:  Right knee osteoarthritis.      POSTOPERATIVE DIAGNOSIS:  Right knee osteoarthritis.      FINDINGS:  The patient was noted to have complete loss of cartilage and   bone-on-bone arthritis with associated osteophytes in the medial and patellofemoral compartments of   the knee with a significant synovitis and associated effusion.      PROCEDURE:  Right total knee replacement.      COMPONENTS USED:  [employer] rotating platform posterior stabilized knee   system, a size 4 femur, 4 tibia, 12.5 mm PS insert, and 41 patellar   button.      SURGEON:  Pietro Cassis. [name], M.D.      ASSISTANT:  Nehemiah Massed, PA-C.      ANESTHESIA:  Spinal.      SPECIMENS:  None.      COMPLICATION:  None.      DRAINS:  None.  EBL: <100      TOURNIQUET TIME:   Total Tourniquet Time Documented: Thigh (Right) - 31 minutes Total: Thigh (Right) - 31 minutes  .      The patient was stable to the recovery room.      INDICATION FOR PROCEDURE:  Eugene White is a 69 y.o. male patient of   mine.  The patient had been seen, evaluated, and treated conservatively in the   office with medication, activity modification, and injections.  The patient had   radiographic changes of bone-on-bone arthritis with endplate sclerosis and osteophytes noted.      The patient failed conservative measures including medication, injections, and activity modification, and at this point was ready for more definitive measures.   Based on the radiographic changes and failed conservative measures, the patient   decided to proceed with total knee replacement.  Risks of infection,   DVT, component failure, need for  revision surgery, postop course, and   expectations were all   discussed and reviewed.  Consent was obtained for benefit of pain   relief.      PROCEDURE IN DETAIL:  The patient was brought to the operative theater.   Once adequate anesthesia, preoperative antibiotics, 2 gm of Ancef administered, the patient was positioned supine with the right thigh tourniquet placed.  The  right lower extremity was prepped and draped in sterile fashion.  A time-   out was performed identifying the patient, planned procedure, and   extremity.      The right lower extremity was placed in the [facility] leg holder.  The leg was   exsanguinated, tourniquet elevated to 250 mmHg.  A midline incision was   made followed by median parapatellar arthrotomy.  Following initial   exposure, attention was  first directed to the patella.  Precut   measurement was noted to be 25 mm.  I resected down to 14 mm and used a   41 patellar button to restore patellar height as well as cover the cut   surface.      The lug holes were drilled and a metal shim was placed to protect the   patella from retractors and saw blades.      At this point, attention was now directed to the femur.  The femoral   canal was opened with a drill, irrigated to try to prevent fat emboli.  An   intramedullary rod was passed at 5 degrees valgus, 10 mm of bone was   resected off the distal femur.  Following this resection, the tibia was   subluxated anteriorly.  Using the extramedullary guide, 2 mm of bone was resected off   the proximal medial tibia.  We confirmed the gap would be   stable medially and laterally with a 10 mm insert as well as confirmed   the cut was perpendicular in the coronal plane, checking with an alignment rod.      Once this was done, I sized the femur to be a size 4 in the anterior-   posterior dimension, chose a standard component based on medial and   lateral dimension.  The size 4 rotation block was then pinned in    position anterior referenced using the C-clamp to set rotation.  The   anterior, posterior, and  chamfer cuts were made without difficulty nor   notching making certain that I was along the anterior cortex to help   with flexion gap stability.      The final box cut was made off the lateral aspect of distal femur.      At this point, the tibia was sized to be a size 4, the size 4 tray was   then pinned in position through the medial third of the tubercle,   drilled, and keel punched.  Trial reduction was now carried with a 4 femur,  4 tibia, a 12.5 mm insert, and the 41 patella botton.  The knee was brought to   extension, full extension with good flexion stability with the patella   tracking through the trochlea without application of pressure.  Given   all these findings, the trial components removed.  Final components were   opened and cement was mixed.  The knee was irrigated with normal saline   solution and pulse lavage.  The synovial lining was   then injected with 30cc of 0.25% Marcaine with epinephrine and 1 cc of Toradol plus 30cc of NS for a   total of 61 cc.      The knee was irrigated.  Final implants were then cemented onto clean and   dried cut surfaces of bone with the knee brought to extension with a 12.5 mm trial insert.      Once the cement had fully cured, the excess cement was removed   throughout the knee.  I confirmed I was satisfied with the range of   motion and stability, and the final 12.5 mm PS insert was chosen.  It was   placed into the knee.      The tourniquet had been let down at 31 minutes.  No significant   hemostasis required.  The   extensor mechanism was then reapproximated using #1 Vicryl and #0 V-lock sutures with the knee   in  flexion.  The   remaining wound was closed with 2-0 Vicryl and running 4-0 Monocryl.   The knee was cleaned, dried, dressed sterilely using Dermabond and   Aquacel dressing.  The patient was then   brought to recovery  room in stable condition, tolerating the procedure   well.   Please note that Physician Assistant, Nehemiah Massed, PA-C, was present for the entirety of the case, and was utilized for pre-operative positioning, peri-operative retractor management, general facilitation of the procedure.  He was also utilized for primary wound closure at the end of the case.              Pietro Cassis [name], M.D.    01/09/2015 10:06 AM

## 2015-01-10 ENCOUNTER — Encounter (HOSPITAL_COMMUNITY): Payer: Self-pay | Admitting: Orthopedic Surgery

## 2015-01-10 LAB — CBC
HCT: 33.6 % — ABNORMAL LOW (ref 39.0–52.0)
Hemoglobin: 11.1 g/dL — ABNORMAL LOW (ref 13.0–17.0)
MCH: 31.2 pg (ref 26.0–34.0)
MCHC: 33 g/dL (ref 30.0–36.0)
MCV: 94.4 fL (ref 78.0–100.0)
Platelets: 256 10*3/uL (ref 150–400)
RBC: 3.56 MIL/uL — ABNORMAL LOW (ref 4.22–5.81)
RDW: 13.4 % (ref 11.5–15.5)
WBC: 14.5 10*3/uL — AB (ref 4.0–10.5)

## 2015-01-10 LAB — BASIC METABOLIC PANEL
ANION GAP: 6 (ref 5–15)
BUN: 12 mg/dL (ref 6–23)
CALCIUM: 8.2 mg/dL — AB (ref 8.4–10.5)
CO2: 27 mmol/L (ref 19–32)
CREATININE: 0.62 mg/dL (ref 0.50–1.35)
Chloride: 99 mEq/L (ref 96–112)
GFR calc Af Amer: >90 mL/min (ref >90)
Glucose, Bld: 146 mg/dL — ABNORMAL HIGH (ref 70–99)
Potassium: 4 mmol/L (ref 3.5–5.1)
Sodium: 132 mmol/L — ABNORMAL LOW (ref 135–145)

## 2015-01-10 NOTE — Progress Notes (Signed)
Physical Therapy Treatment Patient Details Name: Eugene White MRN: [id number] DOB: 1946-04-02 Today's Date: 01/10/2015    History of Present Illness Pt is a 69 year old male s/p R TKA    PT Comments    Pt ambulated, practiced steps, and performed exercises.  Pt feels ready for d/c home today.  Spouse present and educated as well during session.  HEP handout provided.  Pt and spouse had no further questions/concerns.   Follow Up Recommendations  Home health PT     Equipment Recommendations  Rolling walker with 5" wheels;3in1 (PT)    Recommendations for Other Services       Precautions / Restrictions Precautions Precautions: Knee Restrictions Other Position/Activity Restrictions: WBAT    Mobility  Bed Mobility Overal bed mobility: Needs Assistance Bed Mobility: Supine to Sit     Supine to sit: Supervision     General bed mobility comments: self assist R LE over to EOB.  Transfers Overall transfer level: Needs assistance Equipment used: Rolling walker (2 wheeled) Transfers: Sit to/from Stand Sit to Stand: Supervision         General transfer comment: no cues, able to perform proper technique  Ambulation/Gait Ambulation/Gait assistance: Supervision;Min guard Ambulation Distance (Feet): 200 Feet Assistive device: Rolling walker (2 wheeled) Gait Pattern/deviations: Step-to pattern;Antalgic     General Gait Details: verbal cues for sequence, step length, posture   Stairs Stairs: Yes Stairs assistance: Min guard Stair Management: Step to pattern;Backwards;With walker Number of Stairs: 2 General stair comments: verbal cues for safety, technique, sequence, spouse present and observed  Wheelchair Mobility    Modified Rankin (Stroke Patients Only)       Balance                                    Cognition Arousal/Alertness: Awake/alert Behavior During Therapy: WFL for tasks assessed/performed Overall Cognitive Status: Within  Functional Limits for tasks assessed                      Exercises Total Joint Exercises Ankle Circles/Pumps: AROM;Both;15 reps Quad Sets: AROM;Both;15 reps Towel Squeeze: AROM;Both;15 reps Short Arc Quad: AROM;Right;15 reps Heel Slides: AAROM;Right;15 reps Hip ABduction/ADduction: AROM;Right;15 reps Straight Leg Raises: AAROM;Right;15 reps    General Comments General comments (skin integrity, edema, etc.): min guard assist standing to manage gown.      Pertinent Vitals/Pain Pain Assessment: 0-10 Pain Score: 5  Pain Location: R knee Pain Descriptors / Indicators: Sore Pain Intervention(s): Limited activity within patient's tolerance;Monitored during session;Repositioned;Ice applied    Home Living Family/patient expects to be discharged to:: Private residence Living Arrangements: Spouse/significant other   Type of Home: House Home Access: Stairs to enter Entrance Stairs-Rails: None Home Layout: Two level;Able to live on main level with bedroom/bathroom (split) Home Equipment: Tub bench      Prior Function Level of Independence: Independent          PT Goals (current goals can now be found in the care plan section) Acute Rehab PT Goals Patient Stated Goal: home Progress towards PT goals: Progressing toward goals    Frequency  7X/week    PT Plan Current plan remains appropriate    Co-evaluation             End of Session   Activity Tolerance: Patient tolerated treatment well Patient left: in chair;with call bell/phone within reach;with family/visitor present     Time:  5830-9407 PT Time Calculation (min) (ACUTE ONLY): 26 min  Charges:  $Gait Training: 8-22 mins $Therapeutic Exercise: 8-22 mins                    G Codes:      Eugene White,Eugene White 2015-01-22, 11:41 AM Carmelia Bake, PT, DPT 01/22/2015 Pager: [phone]

## 2015-01-10 NOTE — Care Management Note (Signed)
    Page 1 of 2   01/10/2015     4:34:10 PM CARE MANAGEMENT NOTE 01/10/2015  Patient:  Eugene White, Eugene White   Account Number:  [id number]  Date Initiated:  01/10/2015  Documentation initiated by:  [facility]  Subjective/Objective Assessment:   adm: RIGHT TOTAL KNEE ARTHROPLASTY (Right)     Action/Plan:   discharge planning   Anticipated DC Date:  01/10/2015   Anticipated DC Plan:  [place]  CM consult      [facility] Choice  HOME HEALTH   Choice offered to / List presented to:  C-1 Patient   DME arranged  3-N-1  [place]      DME agency  [place] arranged  HH-2 PT      [place].   Status of service:  Completed, signed off Medicare Important Message given?   (If response is "NO", the following Medicare IM given date fields will be blank) Date Medicare IM given:   Medicare IM given by:   Date Additional Medicare IM given:   Additional Medicare IM given by:    Discharge Disposition:  [place]  Per UR Regulation:    If discussed at Long Length of Stay Meetings, dates discussed:    Comments:  01/10/15 16:25 CM met with pt in room at 08:25 to offer choice of home health agency.  Pt chooses [facility] to render HHPT.  Address and contact information verified by pt. Referral called to [facility] rep, Kristen.  Pt needs 3n1 and rolling walker.  Pt has [employer] andCM explained the slow turnaround time for [employer] for getting equipment approved by insurance and delivered.  Pt states if equipment is not here by 15:00 to please have it delivered to his home address.  Cm called [facility] and spoke with Randall Hiss and faxed facesheet, orders, PT eval, progress note.  CM  then made 4  additional calls throughout the day but pt decided not to wait and was discharged at approx 14:20.  CM called [facility], spoke first to [place] who states delivery on the way and then again to [place] who states  Delivery person contacted pt and met him at [employer], here at [facility].  No other CM needs were communicated.  Mariane Masters, BSN, [place].

## 2015-01-10 NOTE — Evaluation (Signed)
Occupational Therapy Evaluation Patient Details Name: Eugene White MRN: [id number] DOB: 1946/04/08 Today's Date: 01/10/2015    History of Present Illness Pt is a 69 year old male s/p R TKA   Clinical Impression   Pt doing well and likely for d/c today. Education provided to pt and wife. Will follow if here after today but ok to d/c from OT standpoint.    Follow Up Recommendations  No OT follow up;Supervision/Assistance - 24 hour    Equipment Recommendations  3 in 1 bedside comode    Recommendations for Other Services       Precautions / Restrictions Precautions Precautions: Knee Restrictions Other Position/Activity Restrictions: WBAT      Mobility Bed Mobility Overal bed mobility: Needs Assistance Bed Mobility: Supine to Sit     Supine to sit: Supervision     General bed mobility comments: self assist R LE over to EOB.  Transfers Overall transfer level: Needs assistance Equipment used: Rolling walker (2 wheeled) Transfers: Sit to/from Stand Sit to Stand: Min guard         General transfer comment: verbal cues for proper hand placement and LE management.    Balance                                            ADL Overall ADL's : Needs assistance/impaired Eating/Feeding: Independent;Sitting   Grooming: Wash/dry hands;Set up;Sitting   Upper Body Bathing: Set up;Sitting   Lower Body Bathing: Minimal assistance;Sit to/from stand   Upper Body Dressing : Set up;Sitting   Lower Body Dressing: Minimal assistance;Sit to/from stand   Toilet Transfer: Min guard;Ambulation;BSC;RW   Toileting- Water quality scientist and Hygiene: Min guard;Sit to/from stand         General ADL Comments: Pt's wife present for session. explained that there is a AE available for LB dressing but wife will assist as needed. pt able to currently reach to top of foot. Reviewed sequence for LB dressing. Needed min cues for correct walker sequence with transfer in  and out of bathroom. Pt wife has a Press photographer and is familiar with use. Reviewed technique and pt and wife both state they feel comfortable with technique. Pt doing well.      Vision                     Perception     Praxis      Pertinent Vitals/Pain Pain Assessment: 0-10 Pain Score: 4  Pain Location: R knee Pain Descriptors / Indicators: Aching Pain Intervention(s): Repositioned (ice packs filled. Pt working with PT immediately following OT.)     Hand Dominance     Extremity/Trunk Assessment Upper Extremity Assessment Upper Extremity Assessment: Overall WFL for tasks assessed           Communication Communication Communication: No difficulties   Cognition Arousal/Alertness: Awake/alert Behavior During Therapy: WFL for tasks assessed/performed Overall Cognitive Status: Within Functional Limits for tasks assessed                     General Comments       Exercises       Shoulder Instructions      Home Living Family/patient expects to be discharged to:: Private residence Living Arrangements: Spouse/significant other   Type of Home: House Home Access: Stairs to enter [employer] of Steps: 2 Entrance Stairs-Rails: None Home Layout:  Two level;Able to live on main level with bedroom/bathroom (split) Alternate Level Stairs-Number of Steps: 1 Alternate Level Stairs-Rails: None Bathroom Shower/Tub: Teacher, early years/pre: Handicapped height     Home Equipment: Tub bench          Prior Functioning/Environment Level of Independence: Independent             OT Diagnosis: Generalized weakness   OT Problem List: Decreased strength;Decreased knowledge of use of DME or AE   OT Treatment/Interventions: Self-care/ADL training;Patient/family education;Therapeutic activities;DME and/or AE instruction    OT Goals(Current goals can be found in the care plan section) Acute Rehab OT Goals Patient Stated Goal: home OT Goal  Formulation: With patient Time For Goal Achievement: 01/17/15 Potential to Achieve Goals: Good  OT Frequency: Min 2X/week   Barriers to D/C:            Co-evaluation              End of Session Equipment Utilized During Treatment: Gait belt  Activity Tolerance: Patient tolerated treatment well Patient left: in chair;with call bell/phone within reach;with family/visitor present   Time: 7416-3845 OT Time Calculation (min): 22 min Charges:  OT General Charges $OT Visit: 1 Procedure OT Evaluation $Initial OT Evaluation Tier I: 1 Procedure OT Treatments $Therapeutic Activity: 8-22 mins G-Codes:    Jules Schick  [phone] 01/10/2015, 9:31 AM

## 2015-01-10 NOTE — Progress Notes (Signed)
     Subjective: 1 Day Post-Op Procedure(s) (LRB): RIGHT TOTAL KNEE ARTHROPLASTY (Right)   Patient reports pain as mild, pain controlled well. No events throughout the night. Ready to be discharged home.  Objective:   VITALS:   Filed Vitals:   01/10/15 0601  BP: 138/79  Pulse: 58  Temp: 97.7 F (36.5 C)  Resp: 17    Dorsiflexion/Plantar flexion intact Incision: dressing C/D/I No cellulitis present Compartment soft  LABS  Recent Labs  01/10/15 0440  HGB 11.1*  HCT 33.6*  WBC 14.5*  PLT 256     Recent Labs  01/10/15 0440  NA 132*  K 4.0  BUN 12  CREATININE 0.62  GLUCOSE 146*     Assessment/Plan: 1 Day Post-Op Procedure(s) (LRB): RIGHT TOTAL KNEE ARTHROPLASTY (Right) Foley cath d/c'ed Advance diet Up with therapy D/C IV fluids Discharge home with home health  Follow up in 2 weeks at [facility]. Follow up with OLIN,Lynetta Tomczak D in 2 weeks.  Contact information:  [facility] [address], [address] [place] Eugene White   PAC  01/10/2015, 9:39 AM

## 2015-01-10 NOTE — Progress Notes (Signed)
[facility] is providing the following services: HHPT  If patient discharges after hours, please call [phone].   Linward Headland 01/10/2015, 10:38 AM

## 2015-01-11 DIAGNOSIS — Z96651 Presence of right artificial knee joint: Secondary | ICD-10-CM | POA: Diagnosis not present

## 2015-01-11 DIAGNOSIS — M150 Primary generalized (osteo)arthritis: Secondary | ICD-10-CM | POA: Diagnosis not present

## 2015-01-11 DIAGNOSIS — K219 Gastro-esophageal reflux disease without esophagitis: Secondary | ICD-10-CM | POA: Diagnosis not present

## 2015-01-11 DIAGNOSIS — Z471 Aftercare following joint replacement surgery: Secondary | ICD-10-CM | POA: Diagnosis not present

## 2015-01-11 DIAGNOSIS — D649 Anemia, unspecified: Secondary | ICD-10-CM | POA: Diagnosis not present

## 2015-01-11 DIAGNOSIS — I10 Essential (primary) hypertension: Secondary | ICD-10-CM | POA: Diagnosis not present

## 2015-01-12 DIAGNOSIS — Z96651 Presence of right artificial knee joint: Secondary | ICD-10-CM | POA: Diagnosis not present

## 2015-01-12 DIAGNOSIS — D649 Anemia, unspecified: Secondary | ICD-10-CM | POA: Diagnosis not present

## 2015-01-12 DIAGNOSIS — Z471 Aftercare following joint replacement surgery: Secondary | ICD-10-CM | POA: Diagnosis not present

## 2015-01-12 DIAGNOSIS — I10 Essential (primary) hypertension: Secondary | ICD-10-CM | POA: Diagnosis not present

## 2015-01-12 DIAGNOSIS — M150 Primary generalized (osteo)arthritis: Secondary | ICD-10-CM | POA: Diagnosis not present

## 2015-01-12 DIAGNOSIS — K219 Gastro-esophageal reflux disease without esophagitis: Secondary | ICD-10-CM | POA: Diagnosis not present

## 2015-01-13 DIAGNOSIS — D649 Anemia, unspecified: Secondary | ICD-10-CM | POA: Diagnosis not present

## 2015-01-13 DIAGNOSIS — Z96651 Presence of right artificial knee joint: Secondary | ICD-10-CM | POA: Diagnosis not present

## 2015-01-13 DIAGNOSIS — Z471 Aftercare following joint replacement surgery: Secondary | ICD-10-CM | POA: Diagnosis not present

## 2015-01-13 DIAGNOSIS — K219 Gastro-esophageal reflux disease without esophagitis: Secondary | ICD-10-CM | POA: Diagnosis not present

## 2015-01-13 DIAGNOSIS — M150 Primary generalized (osteo)arthritis: Secondary | ICD-10-CM | POA: Diagnosis not present

## 2015-01-13 DIAGNOSIS — I10 Essential (primary) hypertension: Secondary | ICD-10-CM | POA: Diagnosis not present

## 2015-01-15 DIAGNOSIS — Z96651 Presence of right artificial knee joint: Secondary | ICD-10-CM | POA: Diagnosis not present

## 2015-01-15 DIAGNOSIS — D649 Anemia, unspecified: Secondary | ICD-10-CM | POA: Diagnosis not present

## 2015-01-15 DIAGNOSIS — K219 Gastro-esophageal reflux disease without esophagitis: Secondary | ICD-10-CM | POA: Diagnosis not present

## 2015-01-15 DIAGNOSIS — Z471 Aftercare following joint replacement surgery: Secondary | ICD-10-CM | POA: Diagnosis not present

## 2015-01-15 DIAGNOSIS — M150 Primary generalized (osteo)arthritis: Secondary | ICD-10-CM | POA: Diagnosis not present

## 2015-01-15 DIAGNOSIS — I10 Essential (primary) hypertension: Secondary | ICD-10-CM | POA: Diagnosis not present

## 2015-01-16 DIAGNOSIS — Z96651 Presence of right artificial knee joint: Secondary | ICD-10-CM | POA: Diagnosis not present

## 2015-01-16 DIAGNOSIS — M150 Primary generalized (osteo)arthritis: Secondary | ICD-10-CM | POA: Diagnosis not present

## 2015-01-16 DIAGNOSIS — K219 Gastro-esophageal reflux disease without esophagitis: Secondary | ICD-10-CM | POA: Diagnosis not present

## 2015-01-16 DIAGNOSIS — I10 Essential (primary) hypertension: Secondary | ICD-10-CM | POA: Diagnosis not present

## 2015-01-16 DIAGNOSIS — D649 Anemia, unspecified: Secondary | ICD-10-CM | POA: Diagnosis not present

## 2015-01-16 DIAGNOSIS — Z471 Aftercare following joint replacement surgery: Secondary | ICD-10-CM | POA: Diagnosis not present

## 2015-01-18 DIAGNOSIS — M150 Primary generalized (osteo)arthritis: Secondary | ICD-10-CM | POA: Diagnosis not present

## 2015-01-18 DIAGNOSIS — Z471 Aftercare following joint replacement surgery: Secondary | ICD-10-CM | POA: Diagnosis not present

## 2015-01-18 DIAGNOSIS — K219 Gastro-esophageal reflux disease without esophagitis: Secondary | ICD-10-CM | POA: Diagnosis not present

## 2015-01-18 DIAGNOSIS — D649 Anemia, unspecified: Secondary | ICD-10-CM | POA: Diagnosis not present

## 2015-01-18 DIAGNOSIS — Z96651 Presence of right artificial knee joint: Secondary | ICD-10-CM | POA: Diagnosis not present

## 2015-01-18 DIAGNOSIS — I10 Essential (primary) hypertension: Secondary | ICD-10-CM | POA: Diagnosis not present

## 2015-01-22 DIAGNOSIS — Z471 Aftercare following joint replacement surgery: Secondary | ICD-10-CM | POA: Diagnosis not present

## 2015-01-22 DIAGNOSIS — I10 Essential (primary) hypertension: Secondary | ICD-10-CM | POA: Diagnosis not present

## 2015-01-22 DIAGNOSIS — M150 Primary generalized (osteo)arthritis: Secondary | ICD-10-CM | POA: Diagnosis not present

## 2015-01-22 DIAGNOSIS — Z96651 Presence of right artificial knee joint: Secondary | ICD-10-CM | POA: Diagnosis not present

## 2015-01-22 DIAGNOSIS — D649 Anemia, unspecified: Secondary | ICD-10-CM | POA: Diagnosis not present

## 2015-01-22 DIAGNOSIS — K219 Gastro-esophageal reflux disease without esophagitis: Secondary | ICD-10-CM | POA: Diagnosis not present

## 2015-01-22 NOTE — Discharge Summary (Signed)
Physician Discharge Summary  Patient ID: Eugene White MRN: [id number] DOB/AGE: 1946/05/21 69 y.o.  Admit date: 01/09/2015 Discharge date: 01/10/2015   Procedures:  Procedure(s) (LRB): RIGHT TOTAL KNEE ARTHROPLASTY (Right)  Attending Physician:  Dr. [name]   Admission Diagnoses:   Right knee primary OA / pain  Discharge Diagnoses:  Principal Problem:   S/P right TKA Active Problems:   DJD (degenerative joint disease) of knee  Past Medical History  Diagnosis Date  . GERD (gastroesophageal reflux disease)   . Hyperlipidemia   . Hypertension   . Thyroid disease     hypo  . 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 Diverticulosis 2014  . Anemia   . Arthritis     OA  . GI symptom     had nausea / vomited once / frequent stools / getting better  . Difficulty sleeping   . Mood changes     HPI:     Eugene White, 69 y.o. male, has a history of pain and functional disability in the right knee due to arthritis and has failed non-surgical conservative treatments for greater than 12 weeks to include NSAID's and/or analgesics, corticosteriod injections, viscosupplementation injections and activity modification. Onset of symptoms was gradual, starting 50+ years ago with gradually worsening course since that time. The patient noted prior procedures on the knee to include arthroscopy on the right knee(s). Patient currently rates pain in the right knee(s) at 9 out of 10 with activity. Patient has worsening of pain with activity and weight bearing, pain that interferes with activities of daily living, pain with passive range of motion, crepitus and joint swelling. Patient has evidence of periarticular osteophytes and joint space narrowing by imaging studies. There is no active infection. Risks, benefits and expectations were discussed with the patient. Risks including but not limited to the risk of anesthesia, blood clots, nerve damage, blood vessel  damage, failure of the prosthesis, infection and up to and including death. Patient understand the risks, benefits and expectations and wishes to proceed with surgery.   PCP: Lelon Huh, MD   Discharged Condition: good  Hospital Course:  Patient underwent the above stated procedure on 01/09/2015. Patient tolerated the procedure well and brought to the recovery room in good condition and subsequently to the floor.  POD #1 BP: 138/79 ; Pulse: 58 ; Temp: 97.7 F (36.5 C) ; Resp: 17 Patient reports pain as mild, pain controlled well. No events throughout the night. Ready to be discharged home. Dorsiflexion/plantar flexion intact, incision: dressing C/D/I, no cellulitis present and compartment soft.   LABS  Basename    HGB  11.1  HCT  33.6    Discharge Exam: General appearance: alert, cooperative and no distress Extremities: Homans sign is negative, no sign of DVT, no edema, redness or tenderness in the calves or thighs and no ulcers, gangrene or trophic changes  Disposition:  Home with follow up in 2 weeks   Follow-up Information    Follow up with [name], MD. Schedule an appointment as soon as possible for a visit in 2 weeks.   Specialty:  Orthopedic Surgery   Contact information:   [address] [place] [address] [phone]       Follow up with [facility].   Why:  home health physical therapy   Contact information:   [address] [place] [address] [phone]  Follow up with [facility].   Why:  3n1 (commode) and rolling walker   Contact information:   [address] [place] [address] [phone]       Discharge Instructions    Call MD / Call 911    Complete by:  As directed   If you experience chest pain or shortness of breath, CALL 911 and be transported to the hospital emergency room.  If you develope a fever above 101 F, pus (white drainage) or increased  drainage or redness at the wound, or calf pain, call your surgeon's office.     Change dressing    Complete by:  As directed   Maintain surgical dressing until follow up in the clinic. If the edges start to pull up, may reinforce with tape. If the dressing is no longer working, may remove and cover with gauze and tape, but must keep the area dry and clean.  Call with any questions or concerns.     Constipation Prevention    Complete by:  As directed   Drink plenty of fluids.  Prune juice may be helpful.  You may use a stool softener, such as Colace (over the counter) 100 mg twice a day.  Use MiraLax (over the counter) for constipation as needed.     Diet - low sodium heart healthy    Complete by:  As directed      Discharge instructions    Complete by:  As directed   Maintain surgical dressing until follow up in the clinic. If the edges start to pull up, may reinforce with tape. If the dressing is no longer working, may remove and cover with gauze and tape, but must keep the area dry and clean.  Follow up in 2 weeks at [facility]. Call with any questions or concerns.     Driving restrictions    Complete by:  As directed   No driving for 4 weeks     Increase activity slowly as tolerated    Complete by:  As directed      TED hose    Complete by:  As directed   Use stockings (TED hose) for 2 weeks on both leg(s).  You may remove them at night for sleeping.     Weight bearing as tolerated    Complete by:  As directed   Laterality:  right  Extremity:  Lower             Medication List    STOP taking these medications        aspirin 81 MG tablet  Replaced by:  aspirin 325 MG EC tablet     meloxicam 15 MG tablet  Commonly known as:  MOBIC      TAKE these medications        amLODipine 5 MG tablet  Commonly known as:  NORVASC  Take 5 mg by mouth daily with lunch.     aspirin 325 MG EC tablet  Take 1 tablet (325 mg total) by mouth 2 (two) times daily.      atorvastatin 20 MG tablet  Commonly known as:  LIPITOR  Take 20 mg by mouth daily.     DSS 100 MG Caps  Take 100 mg by mouth 2 (two) times daily.     ferrous sulfate 325 (65 FE) MG tablet  Take 1 tablet (325 mg total) by mouth 3 (three) times daily after meals.     hydrochlorothiazide 25  MG tablet  Commonly known as:  HYDRODIURIL  Take 1 tablet (25 mg total) by mouth daily.     HYDROcodone-acetaminophen 7.5-325 MG per tablet  Commonly known as:  NORCO  Take 1-2 tablets by mouth every 4 (four) hours as needed for moderate pain.     levothyroxine 150 MCG tablet  Commonly known as:  SYNTHROID, LEVOTHROID  Take 150 mcg by mouth daily.     multivitamin with minerals Tabs tablet  Take 1 tablet by mouth daily.     pantoprazole 40 MG tablet  Commonly known as:  PROTONIX  Take 40 mg by mouth daily.     polycarbophil 625 MG tablet  Commonly known as:  FIBERCON  Take 1,250 mg by mouth daily.     polyethylene glycol packet  Commonly known as:  MIRALAX / GLYCOLAX  Take 17 g by mouth daily as needed for mild constipation.     tiZANidine 4 MG tablet  Commonly known as:  ZANAFLEX  Take 1 tablet (4 mg total) by mouth every 6 (six) hours as needed for muscle spasms.     valsartan-hydrochlorothiazide 160-25 MG per tablet  Commonly known as:  DIOVAN-HCT  Take 1 tablet by mouth daily.     venlafaxine XR 75 MG 24 hr capsule  Commonly known as:  EFFEXOR-XR  Take 75 mg by mouth daily with lunch.     Vitamin D 2000 UNITS Caps  Take 1 capsule by mouth daily.         Signed: [place] Pugh. Diante Barley   PA-C  01/22/2015, 9:31 AM

## 2015-01-25 DIAGNOSIS — D649 Anemia, unspecified: Secondary | ICD-10-CM | POA: Diagnosis not present

## 2015-01-25 DIAGNOSIS — I10 Essential (primary) hypertension: Secondary | ICD-10-CM | POA: Diagnosis not present

## 2015-01-25 DIAGNOSIS — Z96651 Presence of right artificial knee joint: Secondary | ICD-10-CM | POA: Diagnosis not present

## 2015-01-25 DIAGNOSIS — Z471 Aftercare following joint replacement surgery: Secondary | ICD-10-CM | POA: Diagnosis not present

## 2015-01-25 DIAGNOSIS — K219 Gastro-esophageal reflux disease without esophagitis: Secondary | ICD-10-CM | POA: Diagnosis not present

## 2015-01-25 DIAGNOSIS — M150 Primary generalized (osteo)arthritis: Secondary | ICD-10-CM | POA: Diagnosis not present

## 2015-01-30 DIAGNOSIS — M25561 Pain in right knee: Secondary | ICD-10-CM | POA: Diagnosis not present

## 2015-02-02 DIAGNOSIS — M25561 Pain in right knee: Secondary | ICD-10-CM | POA: Diagnosis not present

## 2015-02-05 DIAGNOSIS — M25561 Pain in right knee: Secondary | ICD-10-CM | POA: Diagnosis not present

## 2015-02-08 DIAGNOSIS — M25561 Pain in right knee: Secondary | ICD-10-CM | POA: Diagnosis not present

## 2015-02-12 DIAGNOSIS — M25561 Pain in right knee: Secondary | ICD-10-CM | POA: Diagnosis not present

## 2015-02-15 DIAGNOSIS — M25561 Pain in right knee: Secondary | ICD-10-CM | POA: Diagnosis not present

## 2015-02-20 DIAGNOSIS — M25561 Pain in right knee: Secondary | ICD-10-CM | POA: Diagnosis not present

## 2015-02-21 DIAGNOSIS — Z471 Aftercare following joint replacement surgery: Secondary | ICD-10-CM | POA: Diagnosis not present

## 2015-02-21 DIAGNOSIS — Z96651 Presence of right artificial knee joint: Secondary | ICD-10-CM | POA: Diagnosis not present

## 2015-02-23 DIAGNOSIS — F329 Major depressive disorder, single episode, unspecified: Secondary | ICD-10-CM | POA: Diagnosis not present

## 2015-02-23 DIAGNOSIS — Z0000 Encounter for general adult medical examination without abnormal findings: Secondary | ICD-10-CM | POA: Diagnosis not present

## 2015-02-23 DIAGNOSIS — Z125 Encounter for screening for malignant neoplasm of prostate: Secondary | ICD-10-CM | POA: Diagnosis not present

## 2015-02-23 DIAGNOSIS — M25561 Pain in right knee: Secondary | ICD-10-CM | POA: Diagnosis not present

## 2015-02-23 DIAGNOSIS — I10 Essential (primary) hypertension: Secondary | ICD-10-CM | POA: Diagnosis not present

## 2015-02-23 DIAGNOSIS — M129 Arthropathy, unspecified: Secondary | ICD-10-CM | POA: Diagnosis not present

## 2015-02-23 DIAGNOSIS — K219 Gastro-esophageal reflux disease without esophagitis: Secondary | ICD-10-CM | POA: Diagnosis not present

## 2015-02-27 DIAGNOSIS — M25561 Pain in right knee: Secondary | ICD-10-CM | POA: Diagnosis not present

## 2015-03-02 DIAGNOSIS — M25561 Pain in right knee: Secondary | ICD-10-CM | POA: Diagnosis not present

## 2015-03-05 DIAGNOSIS — E039 Hypothyroidism, unspecified: Secondary | ICD-10-CM | POA: Diagnosis not present

## 2015-03-05 DIAGNOSIS — K210 Gastro-esophageal reflux disease with esophagitis: Secondary | ICD-10-CM | POA: Diagnosis not present

## 2015-03-05 DIAGNOSIS — I10 Essential (primary) hypertension: Secondary | ICD-10-CM | POA: Diagnosis not present

## 2015-03-05 DIAGNOSIS — E780 Pure hypercholesterolemia: Secondary | ICD-10-CM | POA: Diagnosis not present

## 2015-03-07 DIAGNOSIS — M25561 Pain in right knee: Secondary | ICD-10-CM | POA: Diagnosis not present

## 2015-03-09 DIAGNOSIS — M25561 Pain in right knee: Secondary | ICD-10-CM | POA: Diagnosis not present

## 2015-04-11 DIAGNOSIS — Z96651 Presence of right artificial knee joint: Secondary | ICD-10-CM | POA: Diagnosis not present

## 2015-04-11 DIAGNOSIS — M1712 Unilateral primary osteoarthritis, left knee: Secondary | ICD-10-CM | POA: Diagnosis not present

## 2015-04-11 DIAGNOSIS — M25562 Pain in left knee: Secondary | ICD-10-CM | POA: Diagnosis not present

## 2015-04-11 DIAGNOSIS — Z471 Aftercare following joint replacement surgery: Secondary | ICD-10-CM | POA: Diagnosis not present

## 2015-06-27 ENCOUNTER — Telehealth: Payer: Self-pay

## 2015-06-27 DIAGNOSIS — M720 Palmar fascial fibromatosis [Dupuytren]: Secondary | ICD-10-CM | POA: Insufficient documentation

## 2015-06-27 DIAGNOSIS — I10 Essential (primary) hypertension: Secondary | ICD-10-CM | POA: Diagnosis not present

## 2015-06-27 DIAGNOSIS — M1710 Unilateral primary osteoarthritis, unspecified knee: Secondary | ICD-10-CM | POA: Insufficient documentation

## 2015-06-27 DIAGNOSIS — K2100 Gastro-esophageal reflux disease with esophagitis, without bleeding: Secondary | ICD-10-CM | POA: Insufficient documentation

## 2015-06-27 DIAGNOSIS — R05 Cough: Secondary | ICD-10-CM | POA: Diagnosis not present

## 2015-06-27 DIAGNOSIS — M129 Arthropathy, unspecified: Secondary | ICD-10-CM | POA: Insufficient documentation

## 2015-06-27 NOTE — Telephone Encounter (Signed)
Patient's wife Adela Lank states patient has been coughing X 3 weeks, and started running a fever on Sunday. Shelia states patient's fever has been between 100.2-102 since Sunday. Patient has removed 6 ticks within the past week. Per wife patient has a past history of tick fever. Patient is really fatigue, denies headache or rash. Front desk scheduled patient to see Mikki Santee on 6/30.   Advised patient's wife since patient has had a fever since Sunday, and removed tick that he may want to go to the Urgent Care for evaluation. Wife verbalized understanding and stated that if patient decides to go to Urgent Care they will call back to cancel appointment for tomorrow.

## 2015-06-28 ENCOUNTER — Ambulatory Visit: Payer: Self-pay | Admitting: Family Medicine

## 2015-06-28 NOTE — Telephone Encounter (Signed)
Wife canceled appointment with Mikki Santee for today because patient did go to Urgent Care on 6/29.

## 2015-08-20 DIAGNOSIS — M1712 Unilateral primary osteoarthritis, left knee: Secondary | ICD-10-CM | POA: Diagnosis not present

## 2015-08-28 DIAGNOSIS — M25562 Pain in left knee: Secondary | ICD-10-CM | POA: Diagnosis not present

## 2015-08-28 DIAGNOSIS — M1712 Unilateral primary osteoarthritis, left knee: Secondary | ICD-10-CM | POA: Diagnosis not present

## 2015-09-05 DIAGNOSIS — M1712 Unilateral primary osteoarthritis, left knee: Secondary | ICD-10-CM | POA: Diagnosis not present

## 2015-09-05 DIAGNOSIS — M25562 Pain in left knee: Secondary | ICD-10-CM | POA: Diagnosis not present

## 2015-09-18 ENCOUNTER — Other Ambulatory Visit: Payer: Self-pay | Admitting: Family Medicine

## 2015-09-29 DIAGNOSIS — H66002 Acute suppurative otitis media without spontaneous rupture of ear drum, left ear: Secondary | ICD-10-CM | POA: Diagnosis not present

## 2015-10-24 ENCOUNTER — Other Ambulatory Visit: Payer: Self-pay | Admitting: Family Medicine

## 2015-10-31 ENCOUNTER — Other Ambulatory Visit: Payer: Self-pay

## 2015-11-01 ENCOUNTER — Encounter: Payer: Self-pay | Admitting: Family Medicine

## 2015-11-01 ENCOUNTER — Ambulatory Visit: Payer: Self-pay

## 2015-11-01 ENCOUNTER — Other Ambulatory Visit: Payer: Self-pay

## 2015-11-01 ENCOUNTER — Ambulatory Visit (INDEPENDENT_AMBULATORY_CARE_PROVIDER_SITE_OTHER): Payer: Commercial Managed Care - HMO | Admitting: Family Medicine

## 2015-11-01 VITALS — BP 138/90 | HR 72 | Temp 98.7°F | Resp 18 | Wt 227.8 lb

## 2015-11-01 DIAGNOSIS — H6982 Other specified disorders of Eustachian tube, left ear: Secondary | ICD-10-CM

## 2015-11-01 DIAGNOSIS — Z23 Encounter for immunization: Secondary | ICD-10-CM

## 2015-11-01 NOTE — Patient Instructions (Signed)

## 2015-11-01 NOTE — Progress Notes (Signed)
Patient ID: EIDEN BAGOT, male   DOB: 11-03-1946, 69 y.o.   MRN: [id number] Name: Eugene White   MRN: [id number]    DOB: 06-05-1946   Date:11/01/2015       Progress Note  Subjective  Chief Complaint  Chief Complaint  Patient presents with  . URI    URI  This is a new (Onset during a trip to the beach a month ago. Went to an Urgent Care Clinic and treated for ear infection. Continue to have post nasal drip and left ear stopped up.) problem. The current episode started 1 to 4 weeks ago. The problem has been gradually worsening. Associated symptoms include coughing and ear pain. Pertinent negatives include no rhinorrhea, sinus pain, sneezing or sore throat.   Past Surgical History  Procedure Laterality Date  . Knee arthroscopy  1984 & 2001    twice  . Shoulder open rotator cuff repair  1999 & 2006    Right and left  . Hand surgery   1973 / 2010 / 2015    right to release tendons  . Colonoscopy  2014  . Nasal septum surgery    . Total knee arthroplasty Right 01/09/2015    Procedure: RIGHT TOTAL KNEE ARTHROPLASTY;  Surgeon: [name], MD;  Location: [facility];  Service: Orthopedics;  Laterality: Right;   Family History  Problem Relation Age of Onset  . Breast cancer Mother   . Colon cancer Neg Hx   . Esophageal cancer Neg Hx   . Stomach cancer Neg Hx   . Rectal cancer Neg Hx    Past Medical History  Diagnosis Date  . GERD (gastroesophageal reflux disease)   . Hyperlipidemia   . Hypertension   . Thyroid disease     hypo  . 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 Diverticulosis 2014  . Anemia   . Arthritis     OA  . GI symptom     had nausea / vomited once / frequent stools / getting better  . Difficulty sleeping   . Mood changes (HCC)    Social History  Substance Use Topics  . Smoking status: Former Smoker    Quit date: 12/29/1968  . Smokeless tobacco: Never Used  . Alcohol Use: 6.0 oz/week    10 Cans of beer per week     Current outpatient prescriptions:  .  amLODipine (NORVASC) 5 MG tablet, Take 5 mg by mouth daily with lunch. , Disp: , Rfl:  .  atorvastatin (LIPITOR) 20 MG tablet, Take 20 mg by mouth daily., Disp: , Rfl:  .  Cholecalciferol (VITAMIN D) 2000 UNITS CAPS, Take 1 capsule by mouth daily., Disp: , Rfl:  .  docusate sodium 100 MG CAPS, Take 100 mg by mouth 2 (two) times daily., Disp: 10 capsule, Rfl: 0 .  fluticasone (FLONASE) 50 MCG/ACT nasal spray, , Disp: , Rfl:  .  levothyroxine (SYNTHROID, LEVOTHROID) 150 MCG tablet, Take 150 mcg by mouth daily., Disp: , Rfl:  .  meloxicam (MOBIC) 15 MG tablet, Take by mouth., Disp: , Rfl:  .  Multiple Vitamin (MULTIVITAMIN WITH MINERALS) TABS tablet, Take 1 tablet by mouth daily., Disp: , Rfl:  .  pantoprazole (PROTONIX) 40 MG tablet, Take 40 mg by mouth daily., Disp: , Rfl:  .  polycarbophil (FIBERCON) 625 MG tablet, Take 1,250 mg by mouth daily. , Disp: , Rfl:  .  valsartan-hydrochlorothiazide (DIOVAN-HCT) 160-25 MG per tablet, TAKE  1 TABLET EVERY DAY, Disp: 90 tablet, Rfl: 3 .  venlafaxine XR (EFFEXOR-XR) 75 MG 24 hr capsule, TAKE 1 CAPSULE EVERY DAY, Disp: 90 capsule, Rfl: 3  Allergies  Allergen Reactions  . Sulfa Antibiotics Other (See Comments)    unknown   Review of Systems  Constitutional: Negative.   HENT: Positive for ear pain. Negative for rhinorrhea, sneezing and sore throat.   Eyes: Negative.   Respiratory: Positive for cough and sputum production.   Cardiovascular: Negative.   Gastrointestinal: Negative.   Genitourinary: Negative.   Musculoskeletal: Negative.   Skin: Negative.   Neurological: Negative.   Endo/Heme/Allergies: Negative.   Psychiatric/Behavioral: Negative.    Objective  Filed Vitals:   11/01/15 0939  BP: 138/90  Pulse: 72  Temp: 98.7 F (37.1 C)  TempSrc: Oral  Resp: 18  Weight: 227 lb 12.8 oz (103.329 kg)   Physical Exam  Constitutional: He is oriented to person, place, and time and well-developed,  well-nourished, and in no distress.  HENT:  Head: Normocephalic.  Slight retracted TM with diminished hearing.  Eyes: Conjunctivae are normal.  Unchanged right facial and right eye weakness.  Neck: Neck supple.  Cardiovascular: Normal rate and regular rhythm.   Pulmonary/Chest: Breath sounds normal.  Abdominal: Bowel sounds are normal.  Neurological: He is alert and oriented to person, place, and time.    Assessment & Plan  1. Eustachian tube dysfunction, left Onset during a URI a month ago. Still able to momentarily clear left ear, but, it will stop up quickly. Recommend use of nasal steroid (Flonase) and Claritin for post nasal drip. Recheck if no better in 7-10 days to see if ENT referral needed.  2. Needs flu shot - Flu vaccine HIGH DOSE PF

## 2015-11-09 DIAGNOSIS — H40003 Preglaucoma, unspecified, bilateral: Secondary | ICD-10-CM | POA: Diagnosis not present

## 2015-12-07 DIAGNOSIS — M1712 Unilateral primary osteoarthritis, left knee: Secondary | ICD-10-CM | POA: Diagnosis not present

## 2015-12-07 DIAGNOSIS — M25562 Pain in left knee: Secondary | ICD-10-CM | POA: Diagnosis not present

## 2015-12-26 ENCOUNTER — Other Ambulatory Visit: Payer: Self-pay | Admitting: Family Medicine

## 2015-12-26 DIAGNOSIS — K219 Gastro-esophageal reflux disease without esophagitis: Secondary | ICD-10-CM

## 2015-12-26 DIAGNOSIS — I10 Essential (primary) hypertension: Secondary | ICD-10-CM

## 2015-12-27 NOTE — Telephone Encounter (Signed)
Eugene White patient 

## 2016-02-01 ENCOUNTER — Other Ambulatory Visit: Payer: Self-pay | Admitting: Family Medicine

## 2016-02-06 DIAGNOSIS — Z0100 Encounter for examination of eyes and vision without abnormal findings: Secondary | ICD-10-CM | POA: Diagnosis not present

## 2016-04-21 DIAGNOSIS — M1712 Unilateral primary osteoarthritis, left knee: Secondary | ICD-10-CM | POA: Diagnosis not present

## 2016-04-21 DIAGNOSIS — M25562 Pain in left knee: Secondary | ICD-10-CM | POA: Diagnosis not present

## 2016-06-29 ENCOUNTER — Other Ambulatory Visit: Payer: Self-pay | Admitting: Family Medicine

## 2016-10-02 DIAGNOSIS — Z23 Encounter for immunization: Secondary | ICD-10-CM | POA: Diagnosis not present

## 2016-10-06 ENCOUNTER — Ambulatory Visit (INDEPENDENT_AMBULATORY_CARE_PROVIDER_SITE_OTHER): Payer: Commercial Managed Care - HMO | Admitting: Family Medicine

## 2016-10-06 ENCOUNTER — Encounter: Payer: Self-pay | Admitting: Family Medicine

## 2016-10-06 VITALS — BP 150/88 | HR 84 | Temp 98.1°F | Resp 16 | Ht 70.0 in | Wt 228.2 lb

## 2016-10-06 DIAGNOSIS — E7800 Pure hypercholesterolemia, unspecified: Secondary | ICD-10-CM | POA: Diagnosis not present

## 2016-10-06 DIAGNOSIS — I10 Essential (primary) hypertension: Secondary | ICD-10-CM

## 2016-10-06 DIAGNOSIS — Z0000 Encounter for general adult medical examination without abnormal findings: Secondary | ICD-10-CM | POA: Diagnosis not present

## 2016-10-06 DIAGNOSIS — M1712 Unilateral primary osteoarthritis, left knee: Secondary | ICD-10-CM

## 2016-10-06 DIAGNOSIS — K2100 Gastro-esophageal reflux disease with esophagitis, without bleeding: Secondary | ICD-10-CM

## 2016-10-06 NOTE — Progress Notes (Signed)
Patient: Eugene White, Male    DOB: [birth year], 70 y.o.   MRN: [id number] Visit Date: 10/06/2016  Today's Provider: Vernie Murders, PA   Chief Complaint  Patient presents with  . Medicare Wellness   Subjective:    Annual wellness visit Eugene White is a 70 y.o. male who presents today for his Subsequent Annual Wellness Visit. He feels fairly well. He reports exercising daily. He reports he is sleeping fairly well.  -----------------------------------------------------------   Review of Systems  Constitutional: Negative.   HENT: Positive for tinnitus.   Eyes: Negative.   Respiratory: Negative.   Cardiovascular: Negative.   Gastrointestinal: Negative.   Endocrine: Negative.   Genitourinary: Negative.   Musculoskeletal: Negative.   Skin: Negative.   Allergic/Immunologic: Negative.   Neurological: Negative.   Hematological: Negative.   Psychiatric/Behavioral: Positive for sleep disturbance.    Social History   Social History  . Marital status: Married    Spouse name: N/A  . Number of children: N/A  . Years of education: N/A   Occupational History  . retired    Social History Main Topics  . Smoking status: Former Smoker    Quit date: 12/29/1968  . Smokeless tobacco: Never Used  . Alcohol use 6.0 oz/week    10 Cans of beer per week  . Drug use: No  . Sexual activity: Not on file   Other Topics Concern  . Not on file   Social History Narrative  . No narrative on file    Patient Active Problem List   Diagnosis Date Noted  . Esophagitis, reflux 06/27/2015  . Contracture of palmar fascia (Dupuytren's) 06/27/2015  . Arthritis of knee 06/27/2015  . Arthropathia 06/27/2015  . Dupuytren's disease of palm 06/27/2015  . S/P right TKA 01/09/2015  . DJD (degenerative joint disease) of knee 01/09/2015  . Arthritis of knee, degenerative 01/09/2015  . History of artificial joint 01/09/2015  . Fever, unspecified 06/02/2014  . Loss of weight 06/02/2014  .  Leukocytosis, unspecified 06/02/2014  . Abdominal pain, unspecified site 06/02/2014  . Anemia 06/02/2014  . Hypercholesterolemia without hypertriglyceridemia 05/31/2009  . 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 Benign essential HTN 05/31/2009  . Colon, diverticulosis 05/31/2009  . Clinical depression 05/31/2009  . Diverticulitis large intestine 05/31/2009  . Essential (primary) hypertension 05/31/2009  . Major depressive disorder with single episode 05/31/2009  . Pure hypercholesterolemia 05/31/2009    Past Surgical History:  Procedure Laterality Date  . COLONOSCOPY  2014  . HAND SURGERY   1973 / 2010 / 2015   right to release tendons  . KNEE ARTHROSCOPY  1984 & 2001   twice  . NASAL SEPTUM SURGERY    . [place] & 2006   Right and left  . TOTAL KNEE ARTHROPLASTY Right 01/09/2015   Procedure: RIGHT TOTAL KNEE ARTHROPLASTY;  Surgeon: [name], MD;  Location: [facility];  Service: Orthopedics;  Laterality: Right;    His family history includes Breast cancer in his mother.    Previous Medications   AMLODIPINE (NORVASC) 5 MG TABLET    TAKE 1 TABLET EVERY DAY   ATORVASTATIN (LIPITOR) 20 MG TABLET    TAKE 1 TABLET EVERY DAY   CHOLECALCIFEROL (VITAMIN D) 2000 UNITS CAPS    Take 1 capsule by mouth daily.   DOCUSATE SODIUM 100 MG CAPS    Take 100 mg by mouth 2 (two) times daily.   FLUTICASONE (FLONASE) 50 MCG/ACT NASAL SPRAY  LEVOTHYROXINE (SYNTHROID, LEVOTHROID) 150 MCG TABLET    TAKE 1 TABLET EVERY DAY   MELOXICAM (MOBIC) 15 MG TABLET    TAKE 1 TABLET EVERY DAY AS NEEDED   MULTIPLE VITAMIN (MULTIVITAMIN WITH MINERALS) TABS TABLET    Take 1 tablet by mouth daily.   PANTOPRAZOLE (PROTONIX) 40 MG TABLET    TAKE 1 TABLET EVERY DAY   POLYCARBOPHIL (FIBERCON) 625 MG TABLET    Take 1,250 mg by mouth daily.    VALSARTAN-HYDROCHLOROTHIAZIDE (DIOVAN-HCT) 160-25 MG TABLET    TAKE 1 TABLET EVERY DAY   VENLAFAXINE XR (EFFEXOR-XR) 75 MG 24 HR CAPSULE    TAKE 1  CAPSULE EVERY DAY    Patient Care Team: [name], PA as PCP - General (Family Medicine)     Objective:   Vitals: BP (!) 150/88 (BP Location: Right Arm, Patient Position: Sitting, Cuff Size: Large)   Pulse 84   Temp 98.1 F (36.7 C) (Oral)   Resp 16   Ht 5\' 10"  (1.778 m)   Wt 228 lb 3.2 oz (103.5 kg)   BMI 32.74 kg/m  Wt Readings from Last 3 Encounters:  10/06/16 228 lb 3.2 oz (103.5 kg)  11/01/15 227 lb 12.8 oz (103.3 kg)  01/09/15 228 lb (103.4 kg)    Physical Exam  Constitutional: He is oriented to person, place, and time. He appears well-developed and well-nourished.  HENT:  Head: Normocephalic and atraumatic.  Right Ear: External ear normal.  Left Ear: External ear normal.  Nose: Nose normal.  Mouth/Throat: Oropharynx is clear and moist.  Eyes: Conjunctivae and EOM are normal. Pupils are equal, round, and reactive to light. Right eye exhibits no discharge.  Ptosis of right eyelids with history of attempted surgical correction. Right facial weakness congenital.  Neck: Normal range of motion. Neck supple. No tracheal deviation present. No thyromegaly present.  Cardiovascular: Normal rate, regular rhythm, normal heart sounds and intact distal pulses.   No murmur heard. Pulmonary/Chest: Effort normal and breath sounds normal. No respiratory distress. He has no wheezes. He has no rales. He exhibits no tenderness.  Abdominal: Soft. He exhibits no distension and no mass. There is no tenderness. There is no rebound and no guarding.  Genitourinary: Rectum normal, prostate normal and penis normal. Rectal exam shows guaiac negative stool.  Musculoskeletal: Normal range of motion. He exhibits no edema.  Crepitus left knee with good ROM. Well healed scars from right knee replacement in 2016. Normal pulses throughout.  Lymphadenopathy:    He has no cervical adenopathy.  Neurological: He is alert and oriented to person, place, and time. He has normal reflexes. No cranial  nerve deficit. He exhibits normal muscle tone. Coordination normal.  Skin: Skin is warm and dry. No rash noted. No erythema.  Psychiatric: He has a normal mood and affect. His behavior is normal. Judgment and thought content normal.    Activities of Daily Living In your present state of health, do you have any difficulty performing the following activities: 10/06/2016  Hearing? N  Vision? N  Difficulty concentrating or making decisions? N  Walking or climbing stairs? Y  Dressing or bathing? N  Doing errands, shopping? N  Some recent data might be hidden    Fall Risk Assessment Fall Risk  10/06/2016  Falls in the past year? Yes  Number falls in past yr: 1  Injury with Fall? No     Depression Screen PHQ 2/9 Scores 10/06/2016  PHQ - 2 Score 0    Cognitive Testing -  6-CIT  Correct? Score   What year is it? yes 0 0 or 4  What month is it? yes 0 0 or 3  Memorize:    Pia Mau,  41,  [place],      What time is it? (within 1 hour) yes 0 0 or 3  Count backwards from 20 yes 0 0, 2, or 4  Name the months of the year yes 0 0, 2, or 4  Repeat name & address above yes 3 0, 2, 4, 6, 8, or 10       TOTAL SCORE  3/28   Interpretation:  Normal  Normal (0-7) Abnormal (8-28)    Audit-C Alcohol Use Screening  Question Answer Points  How often do you have alcoholic drink? 2-3 times weekly 3  On days you do drink alcohol, how many drinks do you typically consume? 1-2 0  How oftey will you drink 6 or more in a total? less than once times monthly 1  Total Score:  4   A score of 3 or more in women, and 4 or more in men indicates increased risk for alcohol abuse, EXCEPT if all of the points are from question 1.   Assessment & Plan:     Annual Wellness Visit  Reviewed patient's Family Medical History Reviewed and updated list of patient's medical providers Assessment of cognitive impairment was done Assessed patient's functional ability Established a written schedule for  health screening [place] Completed and Reviewed  Exercise Activities and Dietary recommendations Goals    Recommend low fat diet and weight loss. Exercises as arthritis in knees allow.      Immunization History  Administered Date(s) Administered  . Influenza, High Dose Seasonal PF 11/01/2015  . Influenza-Unspecified 09/28/2013  . Pneumococcal Polysaccharide-23 01/07/2013  . Zoster 01/07/2013    Health Maintenance  Topic Date Due  . Hepatitis C Screening  1946/08/27  . TETANUS/TDAP  08/25/1964  . PNA vac Low Risk Adult (2 of 2 - PCV13) 01/07/2014  . INFLUENZA VACCINE  07/29/2016  . COLONOSCOPY  02/18/2023  . ZOSTAVAX  Completed      Discussed health benefits of physical activity, and encouraged him to engage in regular exercise appropriate for his age and condition.    ------------------------------------------------------------------------------------------------------------ 1. Annual physical exam General health stable. Immunizations reviewed. Completed medicare wellness screening. Had flu shot on 10-02-16   2. Primary osteoarthritis of left knee Followed by Dr. [name] (orthopedist) who did the right knee replacement.  3. Hypercholesterolemia without hypertriglyceridemia Tolerating Lipitor 20 mg qd. Will get follow up labs in the next month at follow up time.  4. Esophagitis, reflux Good control with use of Pantoprozole 40 mg qd. No hematemesis or melena.   5. Essential (primary) hypertension Tolerating Diovan-HCT and Amlodipine without hypotensive episodes. No dizziness or syncope.

## 2016-10-10 ENCOUNTER — Inpatient Hospital Stay
Admission: EM | Admit: 2016-10-10 | Discharge: 2016-10-13 | DRG: 481 | Disposition: A | Discharge status: Skilled Nursing Facility | Payer: Commercial Managed Care - HMO | Attending: Internal Medicine | Admitting: Internal Medicine

## 2016-10-10 ENCOUNTER — Emergency Department: Payer: Commercial Managed Care - HMO

## 2016-10-10 DIAGNOSIS — Z87891 Personal history of nicotine dependence: Secondary | ICD-10-CM

## 2016-10-10 DIAGNOSIS — F329 Major depressive disorder, single episode, unspecified: Secondary | ICD-10-CM | POA: Diagnosis not present

## 2016-10-10 DIAGNOSIS — Z96651 Presence of right artificial knee joint: Secondary | ICD-10-CM | POA: Diagnosis present

## 2016-10-10 DIAGNOSIS — Z803 Family history of malignant neoplasm of breast: Secondary | ICD-10-CM

## 2016-10-10 DIAGNOSIS — Z01810 Encounter for preprocedural cardiovascular examination: Secondary | ICD-10-CM | POA: Diagnosis not present

## 2016-10-10 DIAGNOSIS — R6889 Other general symptoms and signs: Secondary | ICD-10-CM | POA: Diagnosis not present

## 2016-10-10 DIAGNOSIS — E871 Hypo-osmolality and hyponatremia: Secondary | ICD-10-CM | POA: Diagnosis present

## 2016-10-10 DIAGNOSIS — Z79899 Other long term (current) drug therapy: Secondary | ICD-10-CM

## 2016-10-10 DIAGNOSIS — T1490XA Injury, unspecified, initial encounter: Secondary | ICD-10-CM

## 2016-10-10 DIAGNOSIS — Y92009 Unspecified place in unspecified non-institutional (private) residence as the place of occurrence of the external cause: Secondary | ICD-10-CM

## 2016-10-10 DIAGNOSIS — W19XXXA Unspecified fall, initial encounter: Secondary | ICD-10-CM | POA: Diagnosis not present

## 2016-10-10 DIAGNOSIS — D649 Anemia, unspecified: Secondary | ICD-10-CM | POA: Diagnosis not present

## 2016-10-10 DIAGNOSIS — E785 Hyperlipidemia, unspecified: Secondary | ICD-10-CM | POA: Diagnosis present

## 2016-10-10 DIAGNOSIS — W010XXA Fall on same level from slipping, tripping and stumbling without subsequent striking against object, initial encounter: Secondary | ICD-10-CM | POA: Diagnosis present

## 2016-10-10 DIAGNOSIS — Z791 Long term (current) use of non-steroidal anti-inflammatories (NSAID): Secondary | ICD-10-CM | POA: Diagnosis not present

## 2016-10-10 DIAGNOSIS — S72112D Displaced fracture of greater trochanter of left femur, subsequent encounter for closed fracture with routine healing: Secondary | ICD-10-CM | POA: Diagnosis not present

## 2016-10-10 DIAGNOSIS — E559 Vitamin D deficiency, unspecified: Secondary | ICD-10-CM | POA: Diagnosis not present

## 2016-10-10 DIAGNOSIS — W19XXXD Unspecified fall, subsequent encounter: Secondary | ICD-10-CM | POA: Diagnosis not present

## 2016-10-10 DIAGNOSIS — S72142A Displaced intertrochanteric fracture of left femur, initial encounter for closed fracture: Secondary | ICD-10-CM | POA: Diagnosis not present

## 2016-10-10 DIAGNOSIS — K210 Gastro-esophageal reflux disease with esophagitis: Secondary | ICD-10-CM | POA: Diagnosis present

## 2016-10-10 DIAGNOSIS — R262 Difficulty in walking, not elsewhere classified: Secondary | ICD-10-CM

## 2016-10-10 DIAGNOSIS — R29810 Facial weakness: Secondary | ICD-10-CM | POA: Diagnosis present

## 2016-10-10 DIAGNOSIS — E039 Hypothyroidism, unspecified: Secondary | ICD-10-CM | POA: Diagnosis present

## 2016-10-10 DIAGNOSIS — Z7982 Long term (current) use of aspirin: Secondary | ICD-10-CM

## 2016-10-10 DIAGNOSIS — M25552 Pain in left hip: Secondary | ICD-10-CM

## 2016-10-10 DIAGNOSIS — S72002A Fracture of unspecified part of neck of left femur, initial encounter for closed fracture: Secondary | ICD-10-CM

## 2016-10-10 DIAGNOSIS — I10 Essential (primary) hypertension: Secondary | ICD-10-CM | POA: Diagnosis present

## 2016-10-10 DIAGNOSIS — Z7401 Bed confinement status: Secondary | ICD-10-CM | POA: Diagnosis not present

## 2016-10-10 DIAGNOSIS — M1990 Unspecified osteoarthritis, unspecified site: Secondary | ICD-10-CM | POA: Diagnosis not present

## 2016-10-10 DIAGNOSIS — M79652 Pain in left thigh: Secondary | ICD-10-CM

## 2016-10-10 HISTORY — DX: Displaced intertrochanteric fracture of left femur, initial encounter for closed fracture: S72.142A

## 2016-10-10 LAB — CBC WITH DIFFERENTIAL/PLATELET
BASOS PCT: 1 %
Basophils Absolute: 0.1 10*3/uL (ref 0–0.1)
Eosinophils Absolute: 0.3 10*3/uL (ref 0–0.7)
Eosinophils Relative: 2 %
HEMATOCRIT: 43.5 % (ref 40.0–52.0)
HEMOGLOBIN: 15.2 g/dL (ref 13.0–18.0)
LYMPHS PCT: 35 %
Lymphs Abs: 4.1 10*3/uL — ABNORMAL HIGH (ref 1.0–3.6)
MCH: 33.8 pg (ref 26.0–34.0)
MCHC: 34.9 g/dL (ref 32.0–36.0)
MCV: 96.8 fL (ref 80.0–100.0)
MONO ABS: 1.2 10*3/uL — AB (ref 0.2–1.0)
MONOS PCT: 10 %
NEUTROS ABS: 6.1 10*3/uL (ref 1.4–6.5)
NEUTROS PCT: 52 %
Platelets: 243 10*3/uL (ref 150–440)
RBC: 4.49 MIL/uL (ref 4.40–5.90)
RDW: 13.6 % (ref 11.5–14.5)
WBC: 11.8 10*3/uL — ABNORMAL HIGH (ref 3.8–10.6)

## 2016-10-10 LAB — COMPREHENSIVE METABOLIC PANEL
ALBUMIN: 4.2 g/dL (ref 3.5–5.0)
ALK PHOS: 81 U/L (ref 38–126)
ALT: 36 U/L (ref 17–63)
AST: 44 U/L — AB (ref 15–41)
Anion gap: 13 (ref 5–15)
BUN: 11 mg/dL (ref 6–20)
CALCIUM: 8.8 mg/dL — AB (ref 8.9–10.3)
CO2: 21 mmol/L — ABNORMAL LOW (ref 22–32)
Chloride: 97 mmol/L — ABNORMAL LOW (ref 101–111)
Creatinine, Ser: 0.71 mg/dL (ref 0.61–1.24)
GFR calc Af Amer: >60 mL/min (ref >60)
GLUCOSE: 100 mg/dL — AB (ref 65–99)
Potassium: 3.6 mmol/L (ref 3.5–5.1)
Sodium: 131 mmol/L — ABNORMAL LOW (ref 135–145)
TOTAL PROTEIN: 7 g/dL (ref 6.5–8.1)
Total Bilirubin: 1 mg/dL (ref 0.3–1.2)

## 2016-10-10 LAB — ETHANOL: ALCOHOL ETHYL (B): 157 mg/dL — AB (ref <5)

## 2016-10-10 MED ORDER — CEFAZOLIN IN D5W 1 GM/50ML IV SOLN
INTRAVENOUS | Status: DC
Start: 2016-10-10 — End: 2016-10-10
  Filled 2016-10-10: qty 50

## 2016-10-10 MED ORDER — ONDANSETRON HCL 4 MG/2ML IJ SOLN
4.0000 mg | Freq: Once | INTRAMUSCULAR | Status: AC
  Administered 2016-10-10: 4 mg via INTRAVENOUS
  Filled 2016-10-10: qty 2

## 2016-10-10 MED ORDER — HYDROMORPHONE HCL 1 MG/ML IJ SOLN
1.0000 mg | Freq: Once | INTRAMUSCULAR | Status: AC
  Administered 2016-10-10: 1 mg via INTRAVENOUS
  Filled 2016-10-10: qty 1

## 2016-10-10 MED ORDER — DEXTROSE 5 % IV SOLN
3.0000 g | INTRAVENOUS | Status: AC
  Administered 2016-10-11: 3 g via INTRAVENOUS
  Filled 2016-10-10 (×2): qty 3000

## 2016-10-10 NOTE — ED Triage Notes (Signed)
Pt came in via [facility] after falling at home. Pt was grilling at home, ETOH on board, turned and fell. C/o L hip pain, keeps reporting "it feels like bone on bone." Pt reports 4 beers tonight. Pt has facial droop at R mouth that is not new per EMS. Takes aspirin.

## 2016-10-10 NOTE — ED Notes (Signed)
Pt transported to x-ray via stretcher. Wife at bedside.

## 2016-10-10 NOTE — ED Provider Notes (Signed)
Time Seen: Approximately 2141  I have reviewed the triage notes  Chief Complaint: Fall and Hip Pain   History of Present Illness: Eugene White is a 70 y.o. male who was at home and transported here by EMS after he fell and landed on his left hip region on concrete. Patient states he tried to get up and ambulate but had extreme pain. Patient admits to drinking 4 beers tonight and was barbecuing. He states last time he ate or drank anything was approximately 2 hours prior to arrival. He denies any syncopal episode. He states he thinks his left knee gave out on him though is not having any significant knee pain at this point.   Past Medical History:  Diagnosis Date  . Anemia   . Arthritis    OA  . Difficulty sleeping   . Diverticulosis 2014  . 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 GERD (gastroesophageal reflux disease)   . GI symptom    had nausea / vomited once / frequent stools / getting better  . Hyperlipidemia   . Hypertension   . Mood changes ([place])   . Thyroid disease    hypo    Patient Active Problem List   Diagnosis Date Noted  . Esophagitis, reflux 06/27/2015  . Contracture of palmar fascia (Dupuytren's) 06/27/2015  . Arthritis of knee 06/27/2015  . Arthropathia 06/27/2015  . Dupuytren's disease of palm 06/27/2015  . S/P right TKA 01/09/2015  . DJD (degenerative joint disease) of knee 01/09/2015  . Arthritis of knee, degenerative 01/09/2015  . History of artificial joint 01/09/2015  . Fever, unspecified 06/02/2014  . Loss of weight 06/02/2014  . Leukocytosis, unspecified 06/02/2014  . Abdominal pain, unspecified site 06/02/2014  . Anemia 06/02/2014  . Hypercholesterolemia without hypertriglyceridemia 05/31/2009  . 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 Benign essential HTN 05/31/2009  . Colon, diverticulosis 05/31/2009  . Clinical depression 05/31/2009  . Diverticulitis large intestine 05/31/2009  . Essential (primary)  hypertension 05/31/2009  . Major depressive disorder with single episode 05/31/2009  . Pure hypercholesterolemia 05/31/2009    Past Surgical History:  Procedure Laterality Date  . COLONOSCOPY  2014  . HAND SURGERY   1973 / 2010 / 2015   right to release tendons  . KNEE ARTHROSCOPY  1984 & 2001   twice  . NASAL SEPTUM SURGERY    . [place] & 2006   Right and left  . TOTAL KNEE ARTHROPLASTY Right 01/09/2015   Procedure: RIGHT TOTAL KNEE ARTHROPLASTY;  Surgeon: [name], MD;  Location: [facility];  Service: Orthopedics;  Laterality: Right;    Past Surgical History:  Procedure Laterality Date  . COLONOSCOPY  2014  . HAND SURGERY   1973 / 2010 / 2015   right to release tendons  . KNEE ARTHROSCOPY  1984 & 2001   twice  . NASAL SEPTUM SURGERY    . [place] & 2006   Right and left  . TOTAL KNEE ARTHROPLASTY Right 01/09/2015   Procedure: RIGHT TOTAL KNEE ARTHROPLASTY;  Surgeon: [name], MD;  Location: [facility];  Service: Orthopedics;  Laterality: Right;    Current Outpatient Rx  . Order #: [id number] Class: Normal  . Order #: [id number] Class: Historical Med  . Order #: [id number] Class: Normal  . Order #: [id number] Class: Historical Med  . Order #: [id number] Class: No Print  . Order #: [id number] Class: Historical Med  .  Order #: [id number] Class: Normal  . Order #: [id number] Class: Normal  . Order #: [id number] Class: Historical Med  . Order #: [id number] Class: Normal  . Order #: [id number] Class: Historical Med  . Order #: [id number] Class: Normal  . Order #: [id number] Class: Normal    Allergies:  Sulfa antibiotics  Family History: Family History  Problem Relation Age of Onset  . Breast cancer Mother   . Colon cancer Neg Hx   . Esophageal cancer Neg Hx   . Stomach cancer Neg Hx   . Rectal cancer Neg Hx     Social History: Social History  Substance Use Topics  . Smoking status: Former Smoker    Quit date: 12/29/1968   . Smokeless tobacco: Never Used  . Alcohol use 6.0 oz/week    10 Cans of beer per week     Review of Systems:   10 point review of systems was performed and was otherwise negative:  Constitutional: No fever Eyes: No visual disturbances ENT: No sore throat, ear pain Cardiac: No chest pain Respiratory: No shortness of breath, wheezing, or stridor Abdomen: No abdominal pain, no vomiting, No diarrhea Endocrine: No weight loss, No night sweats Extremities: Patient denies any other extremity trauma Skin: No rashes, easy bruising Neurologic: No focal weakness, trouble with speech or swollowing Urologic: No dysuria, Hematuria, or urinary frequency   Physical Exam:  ED Triage Vitals  Enc Vitals Group     BP 10/10/16 2113 (!) 159/114     Pulse Rate 10/10/16 2113 87     Resp 10/10/16 2113 20     Temp 10/10/16 2113 97.8 F (36.6 C)     Temp Source 10/10/16 2113 Oral     SpO2 10/10/16 2113 98 %     Weight --      Height --      Head Circumference --      Peak Flow --      Pain Score 10/10/16 2114 5     Pain Loc --      Pain Edu? --      Excl. in [place]? --     General: Awake , Alert , and Oriented times 3; GCS 15 Head: Normal cephalic ,Status post right-sided facial palsy Eyes: Left pupil is reactive to light Nose/Throat: No nasal drainage, patent upper airway without erythema or exudate.  Neck: Supple, Full range of motion, No anterior adenopathy or palpable thyroid masses good flexion extension rotation without any pain or neurapraxia Lungs: Clear to ascultation without wheezes , rhonchi, or rales Heart: Regular rate, regular rhythm without murmurs , gallops , or rubs Abdomen: Soft, non tender without rebound, guarding , or rigidity; bowel sounds positive and symmetric in all 4 quadrants. No organomegaly .        Extremities: Patient has a left lower extremity which was shortened and externally rotated. It appears to be neurovascularly intact. There is no reproducible knee or  ankle pain.  Neurologic:  Motor symmetric without deficits, sensory intact Skin: warm, dry, no rashes   Labs:   All laboratory work was reviewed including any pertinent negatives or positives listed below:  Labs Reviewed  CBC WITH DIFFERENTIAL/PLATELET - Abnormal; Notable for the following:       Result Value   WBC 11.8 (*)    Lymphs Abs 4.1 (*)    Monocytes Absolute 1.2 (*)    All other components within normal limits  COMPREHENSIVE METABOLIC PANEL - Abnormal; Notable for the following:    Sodium 131 (*)  Chloride 97 (*)    CO2 21 (*)    Glucose, Bld 100 (*)    Calcium 8.8 (*)    AST 44 (*)    All other components within normal limits  ETHANOL - Abnormal; Notable for the following:    Alcohol, Ethyl (B) 157 (*)    All other components within normal limits    Radiology:  "Dg Hip Unilat With Pelvis 2-3 Views Left  Result Date: 10/10/2016 CLINICAL DATA:  Status post fall, with left hip pain. Initial encounter. EXAM: DG HIP (WITH OR WITHOUT PELVIS) 2-3V LEFT COMPARISON:  None. FINDINGS: There is a comminuted left femoral intertrochanteric fracture, with a displaced lesser trochanteric fragment, and mild lateral displacement of the distal femur. The left femoral head remains seated at the acetabulum. Mild degenerative change is noted at the lower lumbar spine. The sacroiliac joints are unremarkable. The visualized bowel gas pattern is grossly unremarkable. IMPRESSION: Comminuted left femoral intertrochanteric fracture, with a displaced lesser trochanteric fragment, and mild lateral displacement of the distal femur. Electronically Signed   By: Garald Balding M.D.   On: 10/10/2016 21:56  "  I personally reviewed the radiologic studies     ED Course: * Patient's stay here was uneventful was given IV pain medication. I spoke to Dr. Leslye Peer. Patient will likely have the surgery tomorrow. He'll be admitted by the hospitalist team for pain control and preoperative evaluation. He is  otherwise hemodynamically stable and doesn't seem to have suffered any other significant injury from his fall. Clinical Course     Assessment:  Left hip fracture (closed intertrochanteric)   Final Clinical Impression:   Final diagnoses:  Trauma  Left hip pain     Plan: * Admission           Daymon Larsen, MD 10/10/16 2332

## 2016-10-10 NOTE — ED Notes (Signed)
States he fell on concrete, denies hitting head.

## 2016-10-11 ENCOUNTER — Inpatient Hospital Stay: Payer: Commercial Managed Care - HMO | Admitting: Anesthesiology

## 2016-10-11 ENCOUNTER — Inpatient Hospital Stay: Payer: Commercial Managed Care - HMO

## 2016-10-11 ENCOUNTER — Encounter
Admission: EM | Disposition: A | Discharge status: Skilled Nursing Facility | Payer: Self-pay | Source: Home / Self Care | Attending: Internal Medicine

## 2016-10-11 HISTORY — PX: FEMUR IM NAIL: SHX1597

## 2016-10-11 LAB — BASIC METABOLIC PANEL
Anion gap: 11 (ref 5–15)
BUN: 9 mg/dL (ref 6–20)
CALCIUM: 8.5 mg/dL — AB (ref 8.9–10.3)
CO2: 24 mmol/L (ref 22–32)
CREATININE: 0.57 mg/dL — AB (ref 0.61–1.24)
Chloride: 98 mmol/L — ABNORMAL LOW (ref 101–111)
GFR calc Af Amer: >60 mL/min (ref >60)
GLUCOSE: 120 mg/dL — AB (ref 65–99)
Potassium: 3.1 mmol/L — ABNORMAL LOW (ref 3.5–5.1)
SODIUM: 133 mmol/L — AB (ref 135–145)

## 2016-10-11 LAB — APTT: aPTT: 27 seconds (ref 24–36)

## 2016-10-11 LAB — PROTIME-INR
INR: 1
Prothrombin Time: 13.2 seconds (ref 11.4–15.2)

## 2016-10-11 LAB — LIPID PANEL
CHOL/HDL RATIO: 3.8 ratio
Cholesterol: 192 mg/dL (ref 0–200)
HDL: 51 mg/dL (ref >40)
LDL CALC: UNDETERMINED mg/dL (ref 0–99)
TRIGLYCERIDES: 471 mg/dL — AB (ref <150)
VLDL: UNDETERMINED mg/dL (ref 0–40)

## 2016-10-11 LAB — CBC
HEMATOCRIT: 40.6 % (ref 40.0–52.0)
Hemoglobin: 14 g/dL (ref 13.0–18.0)
MCH: 33.6 pg (ref 26.0–34.0)
MCHC: 34.5 g/dL (ref 32.0–36.0)
MCV: 97.5 fL (ref 80.0–100.0)
PLATELETS: 220 10*3/uL (ref 150–440)
RBC: 4.17 MIL/uL — ABNORMAL LOW (ref 4.40–5.90)
RDW: 13.5 % (ref 11.5–14.5)
WBC: 11.5 10*3/uL — AB (ref 3.8–10.6)

## 2016-10-11 LAB — SURGICAL PCR SCREEN
MRSA, PCR: NEGATIVE
Staphylococcus aureus: NEGATIVE

## 2016-10-11 LAB — MAGNESIUM: MAGNESIUM: 1.9 mg/dL (ref 1.7–2.4)

## 2016-10-11 LAB — PHOSPHORUS: Phosphorus: 2.7 mg/dL (ref 2.5–4.6)

## 2016-10-11 SURGERY — INSERTION, INTRAMEDULLARY ROD, FEMUR
Anesthesia: Spinal | Laterality: Left

## 2016-10-11 MED ORDER — HYDROCODONE-ACETAMINOPHEN 5-325 MG PO TABS
1.0000 | ORAL_TABLET | ORAL | Status: DC | PRN
  Administered 2016-10-11: 2 via ORAL
  Filled 2016-10-11: qty 2

## 2016-10-11 MED ORDER — ONDANSETRON HCL 4 MG/2ML IJ SOLN: 4.0000 mg | Freq: Four times a day (QID) | INTRAMUSCULAR | Status: DC | PRN

## 2016-10-11 MED ORDER — METOCLOPRAMIDE HCL 5 MG/ML IJ SOLN: 5.0000 mg | Freq: Three times a day (TID) | INTRAMUSCULAR | Status: DC | PRN

## 2016-10-11 MED ORDER — ONDANSETRON HCL 4 MG PO TABS: 4.0000 mg | ORAL_TABLET | Freq: Four times a day (QID) | ORAL | Status: DC | PRN

## 2016-10-11 MED ORDER — ROPIVACAINE HCL 5 MG/ML IJ SOLN
INTRAMUSCULAR | Status: DC | PRN
  Administered 2016-10-11: 3 mL via EPIDURAL

## 2016-10-11 MED ORDER — HYDROMORPHONE HCL 1 MG/ML IJ SOLN
1.0000 mg | INTRAMUSCULAR | Status: DC | PRN
  Administered 2016-10-11 – 2016-10-13 (×5): 1 mg via INTRAVENOUS
  Filled 2016-10-11 (×6): qty 1

## 2016-10-11 MED ORDER — PROPOFOL 10 MG/ML IV BOLUS
INTRAVENOUS | Status: DC | PRN
  Administered 2016-10-11: 20 mg via INTRAVENOUS

## 2016-10-11 MED ORDER — LACTATED RINGERS IV SOLN
INTRAVENOUS | Status: DC | PRN
  Administered 2016-10-11 (×2): via INTRAVENOUS

## 2016-10-11 MED ORDER — KCL IN DEXTROSE-NACL 20-5-0.9 MEQ/L-%-% IV SOLN
INTRAVENOUS | Status: DC
Start: 2016-10-11 — End: 2016-10-12
  Administered 2016-10-11 – 2016-10-12 (×2): via INTRAVENOUS
  Filled 2016-10-11 (×5): qty 1000

## 2016-10-11 MED ORDER — AMLODIPINE BESYLATE 5 MG PO TABS
5.0000 mg | ORAL_TABLET | Freq: Every day | ORAL | Status: DC
  Administered 2016-10-11 – 2016-10-13 (×3): 5 mg via ORAL
  Filled 2016-10-11 (×3): qty 1

## 2016-10-11 MED ORDER — KETOROLAC TROMETHAMINE 15 MG/ML IJ SOLN
15.0000 mg | Freq: Four times a day (QID) | INTRAMUSCULAR | Status: AC
  Administered 2016-10-11 – 2016-10-12 (×4): 15 mg via INTRAVENOUS
  Filled 2016-10-11 (×5): qty 1

## 2016-10-11 MED ORDER — DEXTROSE 5 % IV SOLN
3.0000 g | Freq: Four times a day (QID) | INTRAVENOUS | Status: AC
  Administered 2016-10-11 – 2016-10-12 (×3): 3 g via INTRAVENOUS
  Filled 2016-10-11 (×5): qty 3000

## 2016-10-11 MED ORDER — SENNOSIDES-DOCUSATE SODIUM 8.6-50 MG PO TABS: 1.0000 | ORAL_TABLET | Freq: Every evening | ORAL | Status: DC | PRN

## 2016-10-11 MED ORDER — MIDAZOLAM HCL 5 MG/5ML IJ SOLN
INTRAMUSCULAR | Status: DC | PRN
  Administered 2016-10-11: 2 mg via INTRAVENOUS

## 2016-10-11 MED ORDER — ACETAMINOPHEN 500 MG PO TABS
1000.0000 mg | ORAL_TABLET | Freq: Four times a day (QID) | ORAL | Status: AC
  Administered 2016-10-11 – 2016-10-12 (×3): 1000 mg via ORAL
  Filled 2016-10-11 (×3): qty 2

## 2016-10-11 MED ORDER — ZOLPIDEM TARTRATE 5 MG PO TABS
5.0000 mg | ORAL_TABLET | Freq: Every evening | ORAL | Status: DC | PRN
  Administered 2016-10-11: 5 mg via ORAL
  Filled 2016-10-11: qty 1

## 2016-10-11 MED ORDER — HEPARIN SODIUM (PORCINE) 5000 UNIT/ML IJ SOLN
5000.0000 [IU] | Freq: Three times a day (TID) | INTRAMUSCULAR | Status: DC
  Administered 2016-10-11: 5000 [IU] via SUBCUTANEOUS
  Filled 2016-10-11: qty 1

## 2016-10-11 MED ORDER — DOCUSATE SODIUM 100 MG PO CAPS
100.0000 mg | ORAL_CAPSULE | Freq: Two times a day (BID) | ORAL | Status: DC
  Filled 2016-10-11: qty 1

## 2016-10-11 MED ORDER — IRBESARTAN 150 MG PO TABS
150.0000 mg | ORAL_TABLET | Freq: Every day | ORAL | Status: DC
  Administered 2016-10-11 – 2016-10-13 (×3): 150 mg via ORAL
  Filled 2016-10-11 (×3): qty 1

## 2016-10-11 MED ORDER — KETOROLAC TROMETHAMINE 15 MG/ML IJ SOLN
30.0000 mg | Freq: Once | INTRAMUSCULAR | Status: AC
  Administered 2016-10-11: 30 mg via INTRAVENOUS
  Filled 2016-10-11: qty 2

## 2016-10-11 MED ORDER — FLUTICASONE PROPIONATE 50 MCG/ACT NA SUSP
1.0000 | Freq: Every day | NASAL | Status: DC
  Administered 2016-10-11: 1 via NASAL
  Filled 2016-10-11: qty 16

## 2016-10-11 MED ORDER — OXYCODONE HCL 5 MG PO TABS
5.0000 mg | ORAL_TABLET | ORAL | Status: DC | PRN
  Administered 2016-10-11 – 2016-10-13 (×6): 10 mg via ORAL
  Filled 2016-10-11 (×6): qty 2

## 2016-10-11 MED ORDER — MAGNESIUM CITRATE PO SOLN: 1.0000 | Freq: Once | ORAL | Status: DC | PRN

## 2016-10-11 MED ORDER — ENOXAPARIN SODIUM 40 MG/0.4ML ~~LOC~~ SOLN
40.0000 mg | SUBCUTANEOUS | Status: DC
  Administered 2016-10-12 – 2016-10-13 (×2): 40 mg via SUBCUTANEOUS
  Filled 2016-10-11 (×2): qty 0.4

## 2016-10-11 MED ORDER — DIPHENHYDRAMINE HCL 12.5 MG/5ML PO ELIX
12.5000 mg | ORAL_SOLUTION | ORAL | Status: DC | PRN
  Administered 2016-10-11: 12.5 mg via ORAL

## 2016-10-11 MED ORDER — ATORVASTATIN CALCIUM 20 MG PO TABS
20.0000 mg | ORAL_TABLET | Freq: Every day | ORAL | Status: DC
  Administered 2016-10-11 – 2016-10-13 (×3): 20 mg via ORAL
  Filled 2016-10-11 (×3): qty 1

## 2016-10-11 MED ORDER — EPHEDRINE SULFATE 50 MG/ML IJ SOLN
INTRAMUSCULAR | Status: DC | PRN
  Administered 2016-10-11: 20 mg via INTRAVENOUS
  Administered 2016-10-11: 10 mg via INTRAVENOUS
  Administered 2016-10-11: 20 mg via INTRAVENOUS

## 2016-10-11 MED ORDER — ACETAMINOPHEN 650 MG RE SUPP: 650.0000 mg | Freq: Four times a day (QID) | RECTAL | Status: DC | PRN

## 2016-10-11 MED ORDER — PHENYLEPHRINE HCL 10 MG/ML IJ SOLN
INTRAMUSCULAR | Status: DC | PRN
  Administered 2016-10-11: 50 ug via INTRAVENOUS
  Administered 2016-10-11 (×2): 100 ug via INTRAVENOUS

## 2016-10-11 MED ORDER — CALCIUM POLYCARBOPHIL 625 MG PO TABS
1250.0000 mg | ORAL_TABLET | Freq: Every day | ORAL | Status: DC
  Administered 2016-10-11 – 2016-10-13 (×3): 1250 mg via ORAL
  Filled 2016-10-11 (×3): qty 2

## 2016-10-11 MED ORDER — MORPHINE SULFATE (PF) 2 MG/ML IV SOLN
2.0000 mg | INTRAVENOUS | Status: DC | PRN
  Administered 2016-10-11 (×2): 2 mg via INTRAVENOUS
  Filled 2016-10-11 (×2): qty 1

## 2016-10-11 MED ORDER — VENLAFAXINE HCL ER 75 MG PO CP24
75.0000 mg | ORAL_CAPSULE | Freq: Every day | ORAL | Status: DC
  Administered 2016-10-11 – 2016-10-13 (×3): 75 mg via ORAL
  Filled 2016-10-11 (×4): qty 1

## 2016-10-11 MED ORDER — METOCLOPRAMIDE HCL 10 MG PO TABS: 5.0000 mg | ORAL_TABLET | Freq: Three times a day (TID) | ORAL | Status: DC | PRN

## 2016-10-11 MED ORDER — FENTANYL CITRATE (PF) 100 MCG/2ML IJ SOLN
INTRAMUSCULAR | Status: DC | PRN
  Administered 2016-10-11: 50 ug via INTRAVENOUS
  Administered 2016-10-11 (×2): 25 ug via INTRAVENOUS

## 2016-10-11 MED ORDER — PROPOFOL 500 MG/50ML IV EMUL
INTRAVENOUS | Status: DC | PRN
  Administered 2016-10-11: 50 ug/kg/min via INTRAVENOUS

## 2016-10-11 MED ORDER — MAGNESIUM HYDROXIDE 400 MG/5ML PO SUSP
30.0000 mL | Freq: Every day | ORAL | Status: DC | PRN
  Administered 2016-10-12: 30 mL via ORAL
  Filled 2016-10-11: qty 30

## 2016-10-11 MED ORDER — HYDROCHLOROTHIAZIDE 25 MG PO TABS
25.0000 mg | ORAL_TABLET | Freq: Every day | ORAL | Status: DC
  Administered 2016-10-11 – 2016-10-13 (×3): 25 mg via ORAL
  Filled 2016-10-11 (×3): qty 1

## 2016-10-11 MED ORDER — VITAMIN D 1000 UNITS PO TABS
2000.0000 [IU] | ORAL_TABLET | Freq: Every day | ORAL | Status: DC
  Administered 2016-10-11 – 2016-10-13 (×3): 2000 [IU] via ORAL
  Filled 2016-10-11 (×3): qty 2

## 2016-10-11 MED ORDER — FENTANYL CITRATE (PF) 100 MCG/2ML IJ SOLN: 25.0000 ug | INTRAMUSCULAR | Status: DC | PRN

## 2016-10-11 MED ORDER — ONDANSETRON HCL 4 MG/2ML IJ SOLN: 4.0000 mg | Freq: Once | INTRAMUSCULAR | Status: DC | PRN

## 2016-10-11 MED ORDER — LEVOTHYROXINE SODIUM 75 MCG PO TABS
150.0000 ug | ORAL_TABLET | Freq: Every day | ORAL | Status: DC
  Administered 2016-10-11 – 2016-10-13 (×3): 150 ug via ORAL
  Filled 2016-10-11 (×3): qty 2

## 2016-10-11 MED ORDER — ACETAMINOPHEN 325 MG PO TABS
650.0000 mg | ORAL_TABLET | Freq: Four times a day (QID) | ORAL | Status: DC | PRN
  Administered 2016-10-12: 650 mg via ORAL
  Filled 2016-10-11: qty 2

## 2016-10-11 MED ORDER — VALSARTAN-HYDROCHLOROTHIAZIDE 160-25 MG PO TABS: 1.0000 | ORAL_TABLET | Freq: Every day | ORAL | Status: DC

## 2016-10-11 MED ORDER — NEOMYCIN-POLYMYXIN B GU 40-200000 IR SOLN
Status: DC | PRN
  Administered 2016-10-11: 2 mL

## 2016-10-11 MED ORDER — ADULT MULTIVITAMIN W/MINERALS CH
1.0000 | ORAL_TABLET | Freq: Every day | ORAL | Status: DC
  Administered 2016-10-11 – 2016-10-13 (×3): 1 via ORAL
  Filled 2016-10-11 (×3): qty 1

## 2016-10-11 MED ORDER — BUPIVACAINE-EPINEPHRINE (PF) 0.5% -1:200000 IJ SOLN
INTRAMUSCULAR | Status: AC
  Filled 2016-10-11: qty 30

## 2016-10-11 MED ORDER — PANTOPRAZOLE SODIUM 40 MG PO TBEC
40.0000 mg | DELAYED_RELEASE_TABLET | Freq: Every day | ORAL | Status: DC
  Administered 2016-10-11 – 2016-10-13 (×3): 40 mg via ORAL
  Filled 2016-10-11 (×3): qty 1

## 2016-10-11 MED ORDER — ACETAMINOPHEN 325 MG PO TABS: 650.0000 mg | ORAL_TABLET | Freq: Four times a day (QID) | ORAL | Status: DC | PRN

## 2016-10-11 MED ORDER — DOCUSATE SODIUM 100 MG PO CAPS
100.0000 mg | ORAL_CAPSULE | Freq: Two times a day (BID) | ORAL | Status: DC
  Administered 2016-10-11 – 2016-10-13 (×6): 100 mg via ORAL
  Filled 2016-10-11 (×6): qty 1

## 2016-10-11 MED ORDER — BUPIVACAINE-EPINEPHRINE (PF) 0.5% -1:200000 IJ SOLN
INTRAMUSCULAR | Status: DC | PRN
  Administered 2016-10-11: 20 mL

## 2016-10-11 MED ORDER — FLEET ENEMA 7-19 GM/118ML RE ENEM
1.0000 | ENEMA | Freq: Once | RECTAL | Status: DC | PRN
Start: 2016-10-11 — End: 2016-10-14

## 2016-10-11 MED ORDER — BISACODYL 5 MG PO TBEC: 5.0000 mg | DELAYED_RELEASE_TABLET | Freq: Every day | ORAL | Status: DC | PRN

## 2016-10-11 MED ORDER — BISACODYL 10 MG RE SUPP: 10.0000 mg | Freq: Every day | RECTAL | Status: DC | PRN

## 2016-10-11 MED ORDER — SODIUM CHLORIDE 0.9 % IV SOLN
INTRAVENOUS | Status: DC
  Administered 2016-10-11: 75 mL/h via INTRAVENOUS

## 2016-10-11 MED ORDER — POTASSIUM CHLORIDE 10 MEQ/100ML IV SOLN
10.0000 meq | INTRAVENOUS | Status: AC
  Administered 2016-10-11 (×3): 10 meq via INTRAVENOUS
  Filled 2016-10-11 (×4): qty 100

## 2016-10-11 MED ORDER — NEOMYCIN-POLYMYXIN B GU 40-200000 IR SOLN
Status: AC
  Filled 2016-10-11: qty 2

## 2016-10-11 SURGICAL SUPPLY — 38 items
BIT DRILL 4.3MMS DISTAL GRDTED (BIT) ×1 IMPLANT
BNDG COHESIVE 4X5 TAN STRL (GAUZE/BANDAGES/DRESSINGS) ×3 IMPLANT
BNDG COHESIVE 6X5 TAN STRL LF (GAUZE/BANDAGES/DRESSINGS) ×3 IMPLANT
CANISTER SUCT 1200ML W/VALVE (MISCELLANEOUS) ×3 IMPLANT
CHLORAPREP W/TINT 26ML (MISCELLANEOUS) ×6 IMPLANT
DRAPE C-ARMOR (DRAPES) ×3 IMPLANT
DRAPE SHEET LG 3/4 BI-LAMINATE (DRAPES) ×3 IMPLANT
DRILL 4.3MMS DISTAL GRADUATED (BIT) ×3
DRSG OPSITE POSTOP 3X4 (GAUZE/BANDAGES/DRESSINGS) ×9 IMPLANT
DRSG OPSITE POSTOP 4X6 (GAUZE/BANDAGES/DRESSINGS) ×3 IMPLANT
ELECT CAUTERY BLADE 6.4 (BLADE) ×3 IMPLANT
ELECT REM PT RETURN 9FT ADLT (ELECTROSURGICAL) ×3
ELECTRODE REM PT RTRN 9FT ADLT (ELECTROSURGICAL) ×1 IMPLANT
GAUZE SPONGE 4X4 12PLY STRL (GAUZE/BANDAGES/DRESSINGS) ×3 IMPLANT
GLOVE BIO SURGEON STRL SZ8 (GLOVE) ×6 IMPLANT
GLOVE INDICATOR 8.0 STRL GRN (GLOVE) ×3 IMPLANT
GOWN STRL REUS W/ TWL LRG LVL3 (GOWN DISPOSABLE) ×1 IMPLANT
GOWN STRL REUS W/ TWL XL LVL3 (GOWN DISPOSABLE) ×1 IMPLANT
GOWN STRL REUS W/TWL LRG LVL3 (GOWN DISPOSABLE) ×2
GOWN STRL REUS W/TWL XL LVL3 (GOWN DISPOSABLE) ×2
GUIDEPIN VERSANAIL DSP 3.2X444 ×3 IMPLANT
GUIDEWIRE BALL NOSE 80CM (WIRE) ×3 IMPLANT
MAT BLUE FLOOR 46X72 FLO (MISCELLANEOUS) ×3 IMPLANT
NAIL HIP FRACT LT 130D 11X400 (Nail) ×3 IMPLANT
NEEDLE FILTER BLUNT 18X 1/2SAF (NEEDLE) ×2
NEEDLE FILTER BLUNT 18X1 1/2 (NEEDLE) ×1 IMPLANT
NEEDLE HYPO 22GX1.5 SAFETY (NEEDLE) ×3 IMPLANT
NS IRRIG 500ML POUR BTL (IV SOLUTION) ×3 IMPLANT
PACK HIP COMPR (MISCELLANEOUS) ×3 IMPLANT
SCREW BONE CORTICAL 5.0X44 (Screw) ×3 IMPLANT
SCREW LAG 10.5MMX105MM HFN (Screw) ×3 IMPLANT
STAPLER SKIN PROX 35W (STAPLE) ×3 IMPLANT
STRAP SAFETY BODY (MISCELLANEOUS) ×3 IMPLANT
SUT VIC AB 1 CT1 36 (SUTURE) ×3 IMPLANT
SUT VIC AB 2-0 CT1 (SUTURE) ×6 IMPLANT
SYR 30ML LL (SYRINGE) ×3 IMPLANT
SYRINGE 10CC LL (SYRINGE) ×3 IMPLANT
TAPE MICROFOAM 4IN (TAPE) ×3 IMPLANT

## 2016-10-11 NOTE — ED Notes (Signed)
Report given to floor RN. Pt taken to floor via stretcher. Vital signs stable prior to transport.  

## 2016-10-11 NOTE — Progress Notes (Signed)
New Admission Note:   Arrival Method: per stretcher from ED, pt came from home Mental Orientation: alert and oriented X4 Telemetry: none ordered Assessment: Completed Skin: warm, dry with old abrasion noted on the left abdomen, bruise on the left ankle, pt had it before he fell. No wounds noted. IV: G18 on the left hand with transparent dressing, intact Pain: 10/10 scale on the left hip during turning, will administer ordered PRN pain medicine. Safety Measures: Safety Fall Prevention Plan has been given and discussed Admission: Completed 1A Orientation: Patient has been oriented to the room, unit and staff.  Family: wife Sue Lush at bedside  Orders have been reviewed and implemented. Will continue to monitor the patient. Call light has been placed within reach and bed alarm has been activated.   Georgeanna Harrison BSN, RN [facility] 1A

## 2016-10-11 NOTE — Anesthesia Preprocedure Evaluation (Signed)
Anesthesia Evaluation  Patient identified by MRN, date of birth, ID band Patient awake    Reviewed: Allergy & Precautions, NPO status , Patient's Chart, lab work & pertinent test results  History of Anesthesia Complications Negative for: history of anesthetic complications  Airway Mallampati: II       Dental  (+) Poor Dentition, Missing, Chipped   Pulmonary neg pulmonary ROS, former smoker,           Cardiovascular hypertension, Pt. 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, eye problems)    GI/Hepatic Neg liver ROS, GERD  Medicated and Controlled,  Endo/Other  Hypothyroidism   Renal/GU negative Renal ROS     Musculoskeletal  (+) Arthritis , Osteoarthritis,    Abdominal   Peds  Hematology  (+) anemia ,   Anesthesia Other Findings   Reproductive/Obstetrics                             Anesthesia Physical Anesthesia Plan  ASA: III  Anesthesia Plan: Spinal   Post-op Pain Management:    Induction:   Airway Management Planned:   Additional Equipment:   Intra-op Plan:   Post-operative Plan:   Informed Consent: I have reviewed the patients History and Physical, chart, labs and discussed the procedure including the risks, benefits and alternatives for the proposed anesthesia with the patient or authorized representative who has indicated his/her understanding and acceptance.     Plan Discussed with:   Anesthesia Plan Comments:         Anesthesia Quick Evaluation

## 2016-10-11 NOTE — Progress Notes (Signed)
Pt for surgery today, has an order for an informed consent to be transcribed and obtain pt's signature however pt refused to sign at this time. Pt is concerned about his medical insurance coverage and stated that he will sign it "as soon as he get answers from the appropriate people". Will pass information on the incoming morning shift.

## 2016-10-11 NOTE — Progress Notes (Signed)
Per night RN, pt received last SQ dose of heparin at 0100. Dr. Posey Pronto notified during rounds. No new orders received.

## 2016-10-11 NOTE — Transfer of Care (Signed)
Immediate Anesthesia Transfer of Care Note  Patient: Eugene White  Procedure(s) Performed: Procedure(s): INTRAMEDULLARY (IM) NAIL FEMORAL (Left)  Patient Location: PACU  Anesthesia Type:Spinal  Level of Consciousness: awake, alert  and oriented  Airway & Oxygen Therapy: Patient Spontanous Breathing and Patient connected to face mask oxygen  Post-op Assessment: Report given to RN  Post vital signs: Reviewed and stable  Last Vitals:  Vitals:   10/11/16 0841 10/11/16 1238  BP: 131/83 100/72  Pulse: 77 (!) 59  Resp: 18 14  Temp: 37.1 C (!) 36 C    Last Pain:  Vitals:   10/11/16 0954  TempSrc:   PainSc: 3       Patients Stated Pain Goal: 1 ([id number])  Complications: No apparent anesthesia complications

## 2016-10-11 NOTE — Clinical Social Work Note (Signed)
CSW following pending PT recs (SNF v HHPT).   Santiago Bumpers, MSW, LCSW-A [phone]

## 2016-10-11 NOTE — Consult Note (Signed)
ORTHOPAEDIC CONSULTATION  REQUESTING PHYSICIAN: Fritzi Mandes, MD  Chief Complaint:   Left hip pain.  History of Present Illness: Eugene White is a 70 y.o. male with a history of hypertension, hypothyroidism, gastric esophageal reflux disease, and hyperlipidemia who lives independently with his wife. Apparently, he was markedly last evening when he lost his balance and fell, landing on his left side. He was unable to get up. He was brought to the emergency room where x-rays demonstrated a comminuted intertrochanteric fracture of his left hip. He has been admitted at this time for definitive management of this injury. The patient denies any lightheadedness, dizziness, or other symptoms that may have precipitated his fall, although he had been drinking some alcohol while barbecuing. The patient also denies any associated injury resulting from the fall. He did not strike his head or lose consciousness.  Past Medical History:  Diagnosis Date  . Anemia   . Arthritis    OA  . Difficulty sleeping   . Diverticulosis 2014  . 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 GERD (gastroesophageal reflux disease)   . GI symptom    had nausea / vomited once / frequent stools / getting better  . Hyperlipidemia   . Hypertension   . Mood changes ([place])   . Thyroid disease    hypo   Past Surgical History:  Procedure Laterality Date  . COLONOSCOPY  2014  . HAND SURGERY   1973 / 2010 / 2015   right to release tendons  . KNEE ARTHROSCOPY  1984 & 2001   twice  . NASAL SEPTUM SURGERY    . [place] & 2006   Right and left  . TOTAL KNEE ARTHROPLASTY Right 01/09/2015   Procedure: RIGHT TOTAL KNEE ARTHROPLASTY;  Surgeon: [name], MD;  Location: [facility];  Service: Orthopedics;  Laterality: Right;   Social History   Social History  . Marital status: Married    Spouse name: N/A  .  Number of children: N/A  . Years of education: N/A   Occupational History  . retired    Social History Main Topics  . Smoking status: Former Smoker    Quit date: 12/29/1968  . Smokeless tobacco: Never Used  . Alcohol use 6.0 oz/week    10 Cans of beer per week  . Drug use: No  . Sexual activity: Not Asked   Other Topics Concern  . None   Social History Narrative  . None   Family History  Problem Relation Age of Onset  . Breast cancer Mother   . Colon cancer Neg Hx   . Esophageal cancer Neg Hx   . Stomach cancer Neg Hx   . Rectal cancer Neg Hx    Allergies  Allergen Reactions  . Sulfa Antibiotics Other (See Comments)    unknown   Prior to Admission medications   Medication Sig Start Date End Date Taking? Authorizing Provider  amLODipine (NORVASC) 5 MG tablet TAKE 1 TABLET EVERY DAY 12/27/15  Yes Mar Daring, PA-C  aspirin EC 81 MG tablet Take 81 mg by mouth daily.   Yes Historical Provider, MD  atorvastatin (LIPITOR) 20 MG tablet TAKE 1 TABLET EVERY DAY 12/26/15  Yes Birdie Sons, MD  Cholecalciferol (VITAMIN D) 2000 UNITS CAPS Take 1 capsule by mouth daily.   Yes Historical Provider, MD  docusate sodium 100 MG CAPS Take 100 mg by mouth 2 (two)  times daily. 01/09/15  Yes Danae Orleans, PA-C  fluticasone Asencion Islam) 50 MCG/ACT nasal spray  09/29/15  Yes Historical Provider, MD  levothyroxine (SYNTHROID, LEVOTHROID) 150 MCG tablet TAKE 1 TABLET EVERY DAY 12/26/15  Yes Birdie Sons, MD  meloxicam (MOBIC) 15 MG tablet TAKE 1 TABLET EVERY DAY AS NEEDED 02/04/16  Yes Vickki Muff Chrismon, PA  Multiple Vitamin (MULTIVITAMIN WITH MINERALS) TABS tablet Take 1 tablet by mouth daily.   Yes Historical Provider, MD  pantoprazole (PROTONIX) 40 MG tablet TAKE 1 TABLET EVERY DAY 12/27/15  Yes Clearnce Sorrel Burnette, PA-C  polycarbophil (FIBERCON) 625 MG tablet Take 1,250 mg by mouth daily.    Yes Historical Provider, MD  valsartan-hydrochlorothiazide (DIOVAN-HCT) 160-25 MG tablet TAKE  1 TABLET EVERY DAY 06/30/16  Yes Vickki Muff Chrismon, PA  venlafaxine XR (EFFEXOR-XR) 75 MG 24 hr capsule TAKE 1 CAPSULE EVERY DAY 06/30/16  Yes Vickki Muff Chrismon, PA   Dg Hip Unilat With Pelvis 2-3 Views Left  Result Date: 10/10/2016 CLINICAL DATA:  Status post fall, with left hip pain. Initial encounter. EXAM: DG HIP (WITH OR WITHOUT PELVIS) 2-3V LEFT COMPARISON:  None. FINDINGS: There is a comminuted left femoral intertrochanteric fracture, with a displaced lesser trochanteric fragment, and mild lateral displacement of the distal femur. The left femoral head remains seated at the acetabulum. Mild degenerative change is noted at the lower lumbar spine. The sacroiliac joints are unremarkable. The visualized bowel gas pattern is grossly unremarkable. IMPRESSION: Comminuted left femoral intertrochanteric fracture, with a displaced lesser trochanteric fragment, and mild lateral displacement of the distal femur. Electronically Signed   By: Garald Balding M.D.   On: 10/10/2016 21:56    Positive ROS: All other systems have been reviewed and were otherwise negative with the exception of those mentioned in the HPI and as above.  Physical Exam: General:  Alert, no acute distress Psychiatric:  Patient is competent for consent with normal mood and affect   Cardiovascular:  No pedal edema Respiratory:  No wheezing, non-labored breathing GI:  Abdomen is soft and non-tender Skin:  No lesions in the area of chief complaint Neurologic:  Sensation intact distally Lymphatic:  No axillary or cervical lymphadenopathy  Orthopedic Exam:  Orthopedic examination is limited to the left hip and lower extremity. The patient's left lower extremity is shortened and externally rotated as compared to the right. Skin inspection around the hip is unremarkable. He has tenderness to palpation over the anterolateral aspect of the hip. He has more significant pain with any attempted active or passive motion of the left hip or left  lower extremity. He is able to actively dorsiflex and plantarflex his toes and ankle. Sensation is intact to light touch to all digits to patient. He has good capillary refill to his left foot.  X-rays:  X-rays of the pelvis and left hip are available for review. These films demonstrate a displaced 4 part intertrochanteric fracture of the left hip. No significant degenerative changes of the left hip joint are noted. No lytic lesions are identified.  Assessment: Displaced four-part intertrochanteric fracture of left hip.  Plan: The treatment options were discussed with the patient and his wife, who is at the bedside. The patient would like to proceed with surgical intervention to include reduction and internal fixation of the fracture with an intramedullary device. This procedure has been discussed in detail with the patient, as have the potential risks (including bleeding, infection, nerve and/or blood vessel injury, persistent or recurrent pain, malunion and/or nonunion, need  for further surgery, blood clots, strokes, heart attacks and arrhythmias, etc.) and benefits. The patient states his understanding and wishes to proceed. A consent has been signed.  Thank you for ask me to participate in the care of this pleasant man. I will be happy to follow him with you.   Pascal Lux, MD  Beeper #:  [phone]  10/11/2016 10:37 AM

## 2016-10-11 NOTE — Op Note (Signed)
10/10/2016 - 10/11/2016  12:46 PM  Patient:   Eugene White  Pre-Op Diagnosis:   Four-part intertrochanteric fracture, left hip.  Post-Op Diagnosis:   Same.  Procedure:   Reduction and internal fixation of left hip fracture with Biomet Affyxis TFN nail.  Surgeon:   Pascal Lux, MD  Assistant:   None  Anesthesia:   Spinal  Findings:   As above  Complications:   None  EBL:   100 cc  Fluids:   1200 cc crystalloid  UOP:   200 cc  TT:   None  Drains:   None  Closure:   Staples  Implants:   Biomet Affyxis 11 x 400 mm TFN with a 105 mm lag screw and a 44 mm distal interlocking screw  Brief Clinical Note:   The patient is a 70 year old male who sustained the above-noted injury last evening when he apparently tripped and fell onto his left hip. He was brought to the emergency room where x-rays demonstrated the above-noted fracture. The patient has been cleared medically and presents at this time for reduction and internal fixation of the left hip fracture.  Procedure:   The patient was brought into the operating room. After adequate spinal anesthesia was obtained, the patient was lain in the supine position on the fracture table. The uninjured leg was placed in a flexed and abducted position while the injured lower extremity was placed in longitudinal traction. The fracture was reduced using longitudinal traction and internal rotation. The adequacy of reduction was verified fluoroscopically in AP and lateral projections and found to be near anatomic. The lateral aspects of the left hip and thigh were prepped with ChloraPrep solution before being draped sterilely. Preoperative antibiotics were administered. The greater trochanter was identified fluoroscopically and an approximately 3 cm incision made about 2-3 fingerbreadths above the tip of the greater trochanter. The incision was carried down through the subcutaneous tissues to expose the gluteal fascia. This was split the  length of the incision, providing access to the tip of the trochanter. Under fluoroscopic guidance, a guidewire was drilled through the tip of the trochanter into the proximal metaphysis to the level of the lesser trochanter. After verifying its position fluoroscopically in AP and lateral projections, it was overreamed with the initial reamer to the depth of the lesser trochanter. A guidewire was passed down through the femoral canal to the supracondylar region. The adequacy of guidewire position was verified fluoroscopically in AP and lateral projections before the length of the guidewire within the canal was measured and found to be 430 mm. Therefore, a 400 mm nail was selected. The femoral canal  was reamed sequentially using the flexible reamers, beginning with a  9 mm reamer and progressing to a  12.5 mm reamer. This provided good cortical chatter. The 11 x  400 mm Biomet Affyxis TFN rod was selected and advanced to the appropriate depth, as verified fluoroscopically. The guide system for the lag screw was positioned and advanced through an approximately 2 cm stab incision over the lateral aspect of the proximal femur. The guidewire was drilled up through the trochanteric femoral nail and into the femoral neck to rest within 5 mm of subchondral bone. After verifying its position in the femoral neck and head in both AP and lateral projections, the guidewire was measured and found to be optimally replicated by a 123456 mm lag screw. The guidewire was overreamed to the appropriate depth before the lag screw was inserted and advanced to the  appropriate depth as verified fluoroscopically in AP and lateral projections. The locking screw was advanced, then backed off a quarter turn to set the lag screw. Again the adequacy of hardware position and fracture reduction was verified fluoroscopically in AP and lateral projections and found to be excellent.  Attention was directed distally. Using the "perfect circle"  technique, the leg and fluoroscopy machine were positioned appropriately. An approximate 1.5 cm stab incision was made over the skin at the appropriate point before the drill bit was advanced through the cortex and across the static hole of the nail. The appropriate length of the screw was determined before the  44 mm distal interlocking screw was positioned, then advanced and tightened securely. Again the adequacy of screw position was verified fluoroscopically in AP and lateral projections and found to be excellent.  The wounds were irrigated thoroughly with sterile saline solution before the deeper subcutaneous tissues were closed using 2-0 Vicryl interrupted sutures. The skin was closed using staples. Sterile occlusive dressings were applied to all wounds before the patient was transferred back to his/her hospital bed. The patient was then transferred to the recovery room in satisfactory condition after tolerating the procedure well.

## 2016-10-11 NOTE — OR Nursing (Signed)
500 bolus infusing

## 2016-10-11 NOTE — Anesthesia Procedure Notes (Signed)
Spinal  Patient location during procedure: OR Start time: 10/11/2016 10:45 AM End time: 10/11/2016 11:09 AM Preanesthetic Checklist Completed: patient identified, site marked, surgical consent, pre-op evaluation, timeout performed, IV checked, risks and benefits discussed and monitors and equipment checked Spinal Block Patient position: sitting Prep: Betadine Patient monitoring: heart rate, continuous pulse ox, blood pressure and cardiac monitor Approach: midline Location: L3-4 Injection technique: single-shot Needle Needle type: Whitacre and Introducer  Needle gauge: 24 G Needle length: 9 cm Needle insertion depth: 8 cm Additional Notes Negative paresthesia. Negative blood return. Positive free-flowing CSF. Expiration date of kit checked and confirmed. Patient tolerated procedure well, without complications.       

## 2016-10-11 NOTE — H&P (Signed)
[place] @ [facility] Admission History and Physical [employer], D.O.  ---------------------------------------------------------------------------------------------------------------------   PATIENT NAME: Eugene White MR#: [id number] DATE OF BIRTH: 06-24-1946 DATE OF ADMISSION: 10/10/2016 PRIMARY CARE PHYSICIAN: Vernie Murders, PA  REQUESTING/REFERRING PHYSICIAN: ED Dr. Marcelene Butte  CHIEF COMPLAINT: Chief Complaint  Patient presents with  . Fall  . Hip Pain    HISTORY OF PRESENT ILLNESS: Eugene White is a 70 y.o. male with a known history of anemia, arthritis, GERD, hyperlipidemia, hypertension and hypothyroidism presents to the emergency department complaining of left hip pain status post fall.  Patient was in a usual state of health until this afternoon when he was standing at his barbecue grill, his foot got caught and he fell landing on his left hip. He was unable to stand or ambulate secondary to pain. He denies any other injuries. He states he did have 4 beers tonight and usually will drink 2-4 beers per day. In the emergency department he was found to have a left intertrochanteric hip fracture and hospitalist team was contacted requesting admission.  Patient is fully independent, very active, send stairs easily however he has not had a cardiac workup or testing in the past 5 years.  Otherwise there has been no change in status. Patient has been taking medication as prescribed and there has been no recent change in medication or diet.  There has been no recent illness, travel or sick contacts.    Patient denies fevers/chills, weakness, dizziness, chest pain, shortness of breath, N/V/C/D, abdominal pain, dysuria/frequency, changes in mental status.    PAST MEDICAL HISTORY: Past Medical History:  Diagnosis Date  . Anemia   . Arthritis    OA  . Difficulty sleeping   . Diverticulosis 2014  . 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 GERD (gastroesophageal reflux disease)   . GI symptom    had nausea / vomited once / frequent stools / getting better  . Hyperlipidemia   . Hypertension   . Mood changes ([place])   . Thyroid disease    hypo      PAST SURGICAL HISTORY: Past Surgical History:  Procedure Laterality Date  . COLONOSCOPY  2014  . HAND SURGERY   1973 / 2010 / 2015   right to release tendons  . KNEE ARTHROSCOPY  1984 & 2001   twice  . NASAL SEPTUM SURGERY    . [place] & 2006   Right and left  . 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 Smoking status: Former Smoker    Quit date: 12/29/1968  . Smokeless tobacco: Never Used  . Alcohol use 6.0 oz/week    10 Cans of beer per week      FAMILY HISTORY: Family History  Problem Relation Age of Onset  . Breast cancer Mother   . Colon cancer Neg Hx   . Esophageal cancer Neg Hx   . Stomach cancer Neg Hx   . Rectal cancer Neg Hx      MEDICATIONS AT HOME: Prior to Admission medications   Medication Sig Start Date End Date Taking? Authorizing Provider  amLODipine (NORVASC) 5 MG tablet TAKE 1 TABLET EVERY DAY 12/27/15  Yes Mar Daring, PA-C  aspirin  EC 81 MG tablet Take 81 mg by mouth daily.   Yes Historical Provider, MD  atorvastatin (LIPITOR) 20 MG tablet TAKE 1 TABLET EVERY DAY 12/26/15  Yes Birdie Sons, MD  Cholecalciferol (VITAMIN D) 2000 UNITS CAPS Take 1 capsule by mouth daily.   Yes Historical Provider, MD  docusate sodium 100 MG CAPS Take 100 mg by mouth 2 (two) times daily. 01/09/15  Yes Danae Orleans, PA-C  fluticasone Asencion Islam) 50 MCG/ACT nasal spray  09/29/15  Yes Historical Provider, MD  levothyroxine (SYNTHROID, LEVOTHROID) 150 MCG tablet TAKE 1 TABLET EVERY DAY 12/26/15  Yes Birdie Sons,  MD  meloxicam (MOBIC) 15 MG tablet TAKE 1 TABLET EVERY DAY AS NEEDED 02/04/16  Yes Vickki Muff Chrismon, PA  Multiple Vitamin (MULTIVITAMIN WITH MINERALS) TABS tablet Take 1 tablet by mouth daily.   Yes Historical Provider, MD  pantoprazole (PROTONIX) 40 MG tablet TAKE 1 TABLET EVERY DAY 12/27/15  Yes Clearnce Sorrel Burnette, PA-C  polycarbophil (FIBERCON) 625 MG tablet Take 1,250 mg by mouth daily.    Yes Historical Provider, MD  valsartan-hydrochlorothiazide (DIOVAN-HCT) 160-25 MG tablet TAKE 1 TABLET EVERY DAY 06/30/16  Yes Vickki Muff Chrismon, PA  venlafaxine XR (EFFEXOR-XR) 75 MG 24 hr capsule TAKE 1 CAPSULE EVERY DAY 06/30/16  Yes Vickki Muff Chrismon, PA      DRUG ALLERGIES: Allergies  Allergen Reactions  . Sulfa Antibiotics Other (See Comments)    unknown     REVIEW OF SYSTEMS: CONSTITUTIONAL: No fatigue, weakness, fever, chills, weight gain/loss, headache EYES: No blurry or double vision. ENT: No tinnitus, postnasal drip, redness or soreness of the oropharynx. RESPIRATORY: No dyspnea, cough, wheeze, hemoptysis. CARDIOVASCULAR: No chest pain, orthopnea, palpitations, syncope. GASTROINTESTINAL: No nausea, vomiting, constipation, diarrhea, abdominal pain. No hematemesis, melena or hematochezia. GENITOURINARY: No dysuria, frequency, hematuria. ENDOCRINE: No polyuria or nocturia. No heat or cold intolerance. HEMATOLOGY: No anemia, bruising, bleeding. INTEGUMENTARY: No rashes, ulcers, lesions. MUSCULOSKELETAL: Positive left hip pain, negative arthritis, swelling, gout. NEUROLOGIC: No numbness, tingling, weakness or ataxia. No seizure-type activity. PSYCHIATRIC: No anxiety, depression, insomnia.  PHYSICAL EXAMINATION: VITAL SIGNS: Blood pressure (!) 145/89, pulse 90, temperature 97.8 F (36.6 C), temperature source Oral, resp. rate 17, SpO2 98 %.  GENERAL: 70 y.o.-year-old white male patient, well-developed, well-nourished lying in the bed in no acute distress.  Pleasant and cooperative.    HEENT: Head atraumatic, normocephalic. Left pupil reactive, right eye with birth injury No scleral icterus. Extraocular muscles intact. Oropharynx is clear. Mucus membranes moist. NECK: Supple, full range of motion. No JVD, no bruit heard. No cervical lymphadenopathy. CHEST: Normal breath sounds bilaterally. No wheezing, rales, rhonchi or crackles. No use of accessory muscles of respiration.  No reproducible chest wall tenderness.  CARDIOVASCULAR: S1, S2 normal. No murmurs, rubs, or gallops appreciated. Cap refill <2 seconds. ABDOMEN: Soft, nontender, nondistended. No rebound, guarding, rigidity. Normoactive bowel sounds present in all four quadrants. No organomegaly or mass. EXTREMITIES: Left lower extremity is shortened and actually rotated. There is tenderness over the left hip anteriorly and laterally. No pedal edema, cyanosis, or clubbing. NEUROLOGIC: Cranial nerves II through XII are grossly intact with no focal sensorimotor deficit with the exception of right eye and right facial palsy. Left pupil is reactive. Muscle strength 5/5 in all extremities. Sensation intact. Gait not checked. PSYCHIATRIC: The patient is alert and oriented x 3. Normal affect, mood, thought content. SKIN: Warm, dry, and intact without obvious rash, lesion, or ulcer.  LABORATORY PANEL:  CBC  Recent Labs Lab 10/10/16 2119  WBC 11.8*  HGB 15.2  HCT 43.5  PLT 243   ----------------------------------------------------------------------------------------------------------------- Chemistries  Recent Labs Lab 10/10/16 2119  NA 131*  K 3.6  CL 97*  CO2 21*  GLUCOSE 100*  BUN 11  CREATININE 0.71  CALCIUM 8.8*  AST 44*  ALT 36  ALKPHOS 81  BILITOT 1.0   ------------------------------------------------------------------------------------------------------------------ Cardiac Enzymes No results for input(s): TROPONINI in the last 168  hours. ------------------------------------------------------------------------------------------------------------------  RADIOLOGY: Dg Hip Unilat With Pelvis 2-3 Views Left  Result Date: 10/10/2016 CLINICAL DATA:  Status post fall, with left hip pain. Initial encounter. EXAM: DG HIP (WITH OR WITHOUT PELVIS) 2-3V LEFT COMPARISON:  None. FINDINGS: There is a comminuted left femoral intertrochanteric fracture, with a displaced lesser trochanteric fragment, and mild lateral displacement of the distal femur. The left femoral head remains seated at the acetabulum. Mild degenerative change is noted at the lower lumbar spine. The sacroiliac joints are unremarkable. The visualized bowel gas pattern is grossly unremarkable. IMPRESSION: Comminuted left femoral intertrochanteric fracture, with a displaced lesser trochanteric fragment, and mild lateral displacement of the distal femur. Electronically Signed   By: Garald Balding M.D.   On: 10/10/2016 21:56    EKG: Pending at the time of this dictation  IMPRESSION AND PLAN:  This is a 70 y.o. male with a history of anemia, arthritis, GERD, hyperlipidemia, hypertension and hypothyroidism  now being admitted with: 1. Left intertrochanteric hip fracture-admit for orthopedic consultation, pain control, nothing by mouth after midnight. We'll obtain EKG and request cardiology in a.m. for preop evaluation given nonurgent nature of the surgery. 2. Hyponatremia, mild-gentle IV fluid hydration 3. History of hypertension-continue Norvasc and Diovan HCT 4. History of hyperlipidemia-continue Lipitor 5. History of allergies-continue Flonase 6. History of GERD-continue Protonix 7. History of arthritis-pain control. Hold Mobitz and aspirin. 8. History of depression continue Effexor   Diet/Nutrition: Nothing by mouth Fluids: IV normal saline DVT Px: Heparin, SCDs and early ambulation Code Status: Full  All the records are reviewed and case discussed with ED  provider. Management plans discussed with the patient and/or family who express understanding and agree with plan of care.   TOTAL TIME TAKING CARE OF THIS PATIENT: 50 minutes.   Juliette Standre D.O. on 10/11/2016 at 12:48 AM Between 7am to 6pm - Pager - [phone] After 6pm go to [email or web address] - [employer] [facility] Office [phone] CC: Primary care physician; Vernie Murders, PA     Note: This dictation was prepared with Dragon dictation along with smaller phrase technology. Any transcriptional errors that result from this process are unintentional.

## 2016-10-11 NOTE — Anesthesia Procedure Notes (Signed)
Procedure Name: MAC Performed by: Barbarajean Kinzler Oxygen Delivery Method: Simple face mask

## 2016-10-11 NOTE — Progress Notes (Signed)
Pt's potassium level today is 3.1 from a previous 3.6. On call Dr Estanislado Pandy paged and ordered for KCl 93meqs IV infusion x 3. Will administer as ordered.

## 2016-10-11 NOTE — Progress Notes (Signed)
[place] at [place] NAME: Eugene White    MR#:  [id number]  DATE OF BIRTH:  1946-07-08  SUBJECTIVE:  Came in After a mechanical fall at home. Patient denies any complaints other than hip pain. He denies shortness of breath chest pain or any cardiac history. Wife in the room agrees with above. Drinks 2-3 cans of beer on a daily basis. Denies any history of DTs.  REVIEW OF SYSTEMS:   Review of Systems  Constitutional: Negative for chills, fever and weight loss.  HENT: Negative for ear discharge, ear pain and nosebleeds.   Eyes: Negative for blurred vision, pain and discharge.  Respiratory: Negative for sputum production, shortness of breath, wheezing and stridor.   Cardiovascular: Negative for chest pain, palpitations, orthopnea and PND.  Gastrointestinal: Negative for abdominal pain, diarrhea, nausea and vomiting.  Genitourinary: Negative for frequency and urgency.  Musculoskeletal: Positive for joint pain. Negative for back pain.  Neurological: Negative for sensory change, speech change, focal weakness and weakness.  Psychiatric/Behavioral: Negative for depression and hallucinations. The patient is not nervous/anxious.    Tolerating Diet: Tolerating PT:   DRUG ALLERGIES:   Allergies  Allergen Reactions  . Sulfa Antibiotics Other (See Comments)    unknown    VITALS:  Blood pressure 131/83, pulse 77, temperature 98.8 F (37.1 C), temperature source Oral, resp. rate 18, height 5\' 10"  (1.778 m), weight 102.4 kg (225 lb 11.2 oz), SpO2 94 %.  PHYSICAL EXAMINATION:   Physical Exam  GENERAL:  70 y.o.-year-old patient lying in the bed with no acute distress.  EYES: Pupils equal, round, reactive to light and accommodation. No scleral icterus. Extraocular muscles intact.  HEENT: Head atraumatic, normocephalic. Oropharynx and nasopharynx clear.  NECK:  Supple, no jugular venous distention. No thyroid enlargement, no tenderness.   LUNGS: Normal breath sounds bilaterally, no wheezing, rales, rhonchi. No use of accessory muscles of respiration.  CARDIOVASCULAR: S1, S2 normal. No murmurs, rubs, or gallops.  ABDOMEN: Soft, nontender, nondistended. Bowel sounds present. No organomegaly or mass.  EXTREMITIES: No cyanosis, clubbing or edema b/l.    NEUROLOGIC: Cranial nerves II through XII are intact. No focal Motor or sensory deficits b/l.   PSYCHIATRIC:  patient is alert and oriented x 3.  SKIN: No obvious rash, lesion, or ulcer.   LABORATORY PANEL:  CBC  Recent Labs Lab 10/11/16 0337  WBC 11.5*  HGB 14.0  HCT 40.6  PLT 220    Chemistries   Recent Labs Lab 10/10/16 2119 10/11/16 0337  NA 131* 133*  K 3.6 3.1*  CL 97* 98*  CO2 21* 24  GLUCOSE 100* 120*  BUN 11 9  CREATININE 0.71 0.57*  CALCIUM 8.8* 8.5*  MG 1.9  --   AST 44*  --   ALT 36  --   ALKPHOS 81  --   BILITOT 1.0  --    Cardiac Enzymes No results for input(s): TROPONINI in the last 168 hours. RADIOLOGY:  Dg Hip Unilat With Pelvis 2-3 Views Left  Result Date: 10/10/2016 CLINICAL DATA:  Status post fall, with left hip pain. Initial encounter. EXAM: DG HIP (WITH OR WITHOUT PELVIS) 2-3V LEFT COMPARISON:  None. FINDINGS: There is a comminuted left femoral intertrochanteric fracture, with a displaced lesser trochanteric fragment, and mild lateral displacement of the distal femur. The left femoral head remains seated at the acetabulum. Mild degenerative change is noted at the lower lumbar spine. The sacroiliac joints are unremarkable. The visualized bowel gas  pattern is grossly unremarkable. IMPRESSION: Comminuted left femoral intertrochanteric fracture, with a displaced lesser trochanteric fragment, and mild lateral displacement of the distal femur. Electronically Signed   By: Garald Balding M.D.   On: 10/10/2016 21:56   ASSESSMENT AND PLAN:  70 y.o. male with a history of anemia, arthritis, GERD, hyperlipidemia, hypertension and hypothyroidism   now being admitted with: 1. Left intertrochanteric hip fracture after mechanical fall -pt denies any cardiac problems. He is otherwise very functional active at home. -EKG does not show any acute changes. -Patient denies any chest pain. -Patient is cleared for surgery he is at a low to intermediate risk for noncardiac surgery -Patient and wife voiced understanding.  2. Hyponatremia, mild-gentle IV fluid hydration  3. History of hypertension-continue Norvasc and Diovan HCT  4. History of hyperlipidemia-continue Lipitor  5. History of ETOH use on daily basis Drinks 2-3 cans of beer daily. No h/o DT's -w/f s/o WD -S ETOH level high on admission since pt had 4 cans of beer prior to coming here  6. History of GERD-continue Protonix  7. History of arthritis-pain control. Hold Mobitz and aspirin.  8. History of depression continue Effexor  Discussed with Dr.Poggi.  Case discussed with Care Management/Social Worker. Management plans discussed with the patient, family and they are in agreement.  CODE STATUS: full  DVT Prophylaxis: pending surgery. Cont TEDS and SCD  TOTAL TIME TAKING CARE OF THIS PATIENT: 35 minutes.  >50% time spent on counselling and coordination of care pt and wife  POSSIBLE D/C IN 2-3 DAYS, DEPENDING ON CLINICAL CONDITION.  Note: This dictation was prepared with Dragon dictation along with smaller phrase technology. Any transcriptional errors that result from this process are unintentional.  Noretta Frier M.D on 10/11/2016 at 10:04 AM  Between 7am to 6pm - Pager - [phone]  After 6pm go to [email or web address] - password [email or web address] [facility]  [facility]  Office  [phone]  CC: Primary care physician; Vernie Murders, PA

## 2016-10-11 NOTE — Progress Notes (Addendum)
Transfer Note  Arrival Method: Stretcher Mental Orientation: A&O x 4 Telemetry: not ordered. Pt is on continuous pulse ox in room Assessment: Complete Skin: 3 new incisions to lateral L thigh/hip IV: Infusing Pain: controlled currently 2/10 Safety Measures: bed alarm on, family at bedside, yellow socks on Unit Orientation: Complete Family: Wife and daughter at bedside   VSS: BP 125/77 (BP Location: Left Arm)   Pulse 66   Temp 97.6 F (36.4 C) (Oral)   Resp 20   Ht 5\' 10"  (1.778 m)   Wt 102.4 kg (225 lb 11.2 oz)   SpO2 100%   BMI 32.38 kg/m

## 2016-10-12 LAB — BASIC METABOLIC PANEL
ANION GAP: 4 — AB (ref 5–15)
BUN: 10 mg/dL (ref 6–20)
CO2: 30 mmol/L (ref 22–32)
Calcium: 8.3 mg/dL — ABNORMAL LOW (ref 8.9–10.3)
Chloride: 102 mmol/L (ref 101–111)
Creatinine, Ser: 0.69 mg/dL (ref 0.61–1.24)
GFR calc Af Amer: >60 mL/min (ref >60)
GFR calc non Af Amer: >60 mL/min (ref >60)
GLUCOSE: 125 mg/dL — AB (ref 65–99)
POTASSIUM: 3.6 mmol/L (ref 3.5–5.1)
Sodium: 136 mmol/L (ref 135–145)

## 2016-10-12 LAB — CBC WITH DIFFERENTIAL/PLATELET
BASOS ABS: 0 10*3/uL (ref 0–0.1)
Basophils Relative: 0 %
Eosinophils Absolute: 0.2 10*3/uL (ref 0–0.7)
Eosinophils Relative: 3 %
HEMATOCRIT: 33.3 % — AB (ref 40.0–52.0)
Hemoglobin: 11.5 g/dL — ABNORMAL LOW (ref 13.0–18.0)
LYMPHS ABS: 0.9 10*3/uL — AB (ref 1.0–3.6)
LYMPHS PCT: 13 %
MCH: 34 pg (ref 26.0–34.0)
MCHC: 34.5 g/dL (ref 32.0–36.0)
MCV: 98.4 fL (ref 80.0–100.0)
MONO ABS: 0.7 10*3/uL (ref 0.2–1.0)
Monocytes Relative: 10 %
NEUTROS ABS: 4.9 10*3/uL (ref 1.4–6.5)
Neutrophils Relative %: 74 %
Platelets: 153 10*3/uL (ref 150–440)
RBC: 3.39 MIL/uL — AB (ref 4.40–5.90)
RDW: 13.2 % (ref 11.5–14.5)
WBC: 6.7 10*3/uL (ref 3.8–10.6)

## 2016-10-12 NOTE — Care Management Important Message (Signed)
Important Message  Patient Details  Name: Eugene White MRN: [id number] Date of Birth: 1946-03-02   Medicare Important Message Given:  Yes    Yanisa Goodgame A, RN 10/12/2016, 2:20 PM

## 2016-10-12 NOTE — Progress Notes (Signed)
[place] at [place] NAME: Eugene White    MR#:  [id number]  DATE OF BIRTH:  April 03, 1946  SUBJECTIVE:  Came in after a mechanical fall at home. POD #1  Doing well Sat in the chair last pm  REVIEW OF SYSTEMS:   Review of Systems  Constitutional: Negative for chills, fever and weight loss.  HENT: Negative for ear discharge, ear pain and nosebleeds.   Eyes: Negative for blurred vision, pain and discharge.  Respiratory: Negative for sputum production, shortness of breath, wheezing and stridor.   Cardiovascular: Negative for chest pain, palpitations, orthopnea and PND.  Gastrointestinal: Negative for abdominal pain, diarrhea, nausea and vomiting.  Genitourinary: Negative for frequency and urgency.  Musculoskeletal: Positive for joint pain. Negative for back pain.  Neurological: Negative for sensory change, speech change, focal weakness and weakness.  Psychiatric/Behavioral: Negative for depression and hallucinations. The patient is not nervous/anxious.    Tolerating Diet:yes Tolerating PT: pending  DRUG ALLERGIES:   Allergies  Allergen Reactions  . Sulfa Antibiotics Other (See Comments)    unknown    VITALS:  Blood pressure 138/62, pulse 80, temperature 99.9 F (37.7 C), temperature source Oral, resp. rate 20, height 5\' 10"  (1.778 m), weight 102.4 kg (225 lb 11.2 oz), SpO2 97 %.  PHYSICAL EXAMINATION:   Physical Exam  GENERAL:  70 y.o.-year-old patient lying in the bed with no acute distress.  EYES: Pupils equal, round, reactive to light and accommodation. No scleral icterus. Extraocular muscles intact.  HEENT: Head atraumatic, normocephalic. Oropharynx and nasopharynx clear.  NECK:  Supple, no jugular venous distention. No thyroid enlargement, no tenderness.  LUNGS: Normal breath sounds bilaterally, no wheezing, rales, rhonchi. No use of accessory muscles of respiration.  CARDIOVASCULAR: S1, S2 normal. No murmurs, rubs,  or gallops.  ABDOMEN: Soft, nontender, nondistended. Bowel sounds present. No organomegaly or mass.  EXTREMITIES: No cyanosis, clubbing or edema b/l.   Surgical dressing+ NEUROLOGIC: Cranial nerves II through XII are intact. No focal Motor or sensory deficits b/l.   PSYCHIATRIC:  patient is alert and oriented x 3.  SKIN: No obvious rash, lesion, or ulcer.   LABORATORY PANEL:  CBC  Recent Labs Lab 10/12/16 0428  WBC 6.7  HGB 11.5*  HCT 33.3*  PLT 153    Chemistries   Recent Labs Lab 10/10/16 2119  10/12/16 0428  NA 131*  < > 136  K 3.6  < > 3.6  CL 97*  < > 102  CO2 21*  < > 30  GLUCOSE 100*  < > 125*  BUN 11  < > 10  CREATININE 0.71  < > 0.69  CALCIUM 8.8*  < > 8.3*  MG 1.9  --   --   AST 44*  --   --   ALT 36  --   --   ALKPHOS 81  --   --   BILITOT 1.0  --   --   < > = values in this interval not displayed. Cardiac Enzymes No results for input(s): TROPONINI in the last 168 hours. RADIOLOGY:  Dg Hip Operative Unilat W Or W/o Pelvis Left  Result Date: 10/11/2016 CLINICAL DATA:  Intra medullary nail placement. EXAM: OPERATIVE LEFT HIP (WITH PELVIS IF PERFORMED)  VIEWS TECHNIQUE: Fluoroscopic spot image(s) were submitted for interpretation post-operatively. FLUOROSCOPY TIME:  58 seconds IMAGES: 4 COMPARISON:  October 10, 2016 FINDINGS: An intra medullary nail has been placed across the intertrochanteric fracture. An intra medullary rod  is seen within the femur, affixed distally with an interlocking screw. Hardware is in good position. IMPRESSION: Appropriate placement of hardware across the left intertrochanteric fracture. Electronically Signed   By: Dorise Bullion III M.D   On: 10/11/2016 12:35   Dg Hip Unilat With Pelvis 2-3 Views Left  Result Date: 10/10/2016 CLINICAL DATA:  Status post fall, with left hip pain. Initial encounter. EXAM: DG HIP (WITH OR WITHOUT PELVIS) 2-3V LEFT COMPARISON:  None. FINDINGS: There is a comminuted left femoral intertrochanteric  fracture, with a displaced lesser trochanteric fragment, and mild lateral displacement of the distal femur. The left femoral head remains seated at the acetabulum. Mild degenerative change is noted at the lower lumbar spine. The sacroiliac joints are unremarkable. The visualized bowel gas pattern is grossly unremarkable. IMPRESSION: Comminuted left femoral intertrochanteric fracture, with a displaced lesser trochanteric fragment, and mild lateral displacement of the distal femur. Electronically Signed   By: Garald Balding M.D.   On: 10/10/2016 21:56   ASSESSMENT AND PLAN:  70 y.o. male with a history of anemia, arthritis, GERD, hyperlipidemia, hypertension and hypothyroidism  now being admitted with: 1. Left intertrochanteric hip fracture after mechanical fall POD #1 -doing well -await PT eval -prn pain meds  2. Hyponatremia, mild-gentle IV fluid hydration -resolved  3. History of hypertension-continue Norvasc and Diovan HCT  4. History of hyperlipidemia-continue Lipitor  5. History of ETOH use on daily basis Drinks 2-3 cans of beer daily. No h/o DT's -w/f s/o WD  6. History of GERD-continue Protonix  7. History of arthritis-pain control. Hold Mobitz and aspirin.  8. History of depression continue Effexor  D/c foley and IVF today CSW for d/c planning  Case discussed with Care Management/Social Worker. Management plans discussed with the patient, family and they are in agreement.  CODE STATUS: full  DVT Prophylaxis: lovenox/SCD/TEDS  TOTAL TIME TAKING CARE OF THIS PATIENT: 3 minutes.  >50% time spent on counselling and coordination of care pt and RN POSSIBLE D/C IN 1-2DAYS, DEPENDING ON CLINICAL CONDITION.  Note: This dictation was prepared with Dragon dictation along with smaller phrase technology. Any transcriptional errors that result from this process are unintentional.  Robel Wuertz M.D on 10/12/2016 at 7:16 AM  Between 7am to 6pm - Pager - [phone]  After 6pm go  to [email or web address] - password [email or web address] [facility]  [facility]  Office  [phone]  CC: Primary care physician; Vernie Murders, PA

## 2016-10-12 NOTE — Evaluation (Signed)
Physical Therapy Evaluation Patient Details Name: Eugene White MRN: [id number] DOB: [birth year] Today's Date: 10/12/2016   History of Present Illness  Pt is a 70 y.o. male s/p fall onto L hip (got foot caught when barbecuing; ETOH on board).  Pt sustained comminuted L femoral intertrochanteric fx with displaced lesser trochanteric fragment and mild lateral displacement of distal femur.  Pt s/p ORIF L hip fx 10/11/16.  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  Clinical Impression  Prior to admission, pt was independent without AD.  Pt lives with his wife on main floor of home with 2 STE no railing.  Currently pt is min assist supine to sit, min assist with transfers, and min assist ambulating 30 feet with RW.  Distance ambulated limited d/t L hip/thigh pain.  Pt would benefit from skilled PT to address noted impairments and functional limitations.  Recommend pt discharge to STR (pending pt's progress) when medically appropriate.     Follow Up Recommendations  (STR pending progress)    Equipment Recommendations   (pt already has RW at home)    Recommendations for Other Services       Precautions / Restrictions Precautions Precautions: Fall Restrictions Weight Bearing Restrictions: Yes LLE Weight Bearing: Weight bearing as tolerated      Mobility  Bed Mobility Overal bed mobility: Needs Assistance Bed Mobility: Supine to Sit     Supine to sit: Min assist;HOB elevated     General bed mobility comments: increased time to perform, assist for L LE  Transfers Overall transfer level: Needs assistance Equipment used: Rolling walker (2 wheeled) Transfers: Sit to/from Stand Sit to Stand: Min assist         General transfer comment: assist to come to full upright posture standing and control descent sitting; vc's for hand and feet placement required; increased  time to perform  Ambulation/Gait Ambulation/Gait assistance: Min assist Ambulation Distance (Feet): 30 Feet Assistive device: Rolling walker (2 wheeled)   Gait velocity: decreased   General Gait Details: antalgic; decreased stance time L LE; vc's required for gait technique and walker use; assist to prevent RW pushing too far forward required; limited distance d/t pain  Stairs            Wheelchair Mobility    Modified Rankin (Stroke Patients Only)       Balance Overall balance assessment: Needs assistance Sitting-balance support: Bilateral upper extremity supported;Feet supported Sitting balance-Leahy Scale: Fair Sitting balance - Comments: R lean to off-weight L hip/thigh pain Postural control: Right lateral lean Standing balance support: Bilateral upper extremity supported;During functional activity (UE support on RW) Standing balance-Leahy Scale: Fair                               Pertinent Vitals/Pain Pain Assessment: 0-10 Pain Score: 8  (8/10 with ambulation; 2/10 end of session) Pain Location: L hip/thigh Pain Descriptors / Indicators: Sore;Tender;Aching (sharp pain with WB'ing) Pain Intervention(s): Limited activity within patient's tolerance;Monitored during session;Premedicated before session;Repositioned;Ice applied  HR 73-88 bpm and O2 >90% on RA throughout session.    Home Living Family/patient expects to be discharged to:: Private residence Living Arrangements: Spouse/significant other Available Help at Discharge: Family Type of Home: House Home Access: Stairs to enter Entrance Stairs-Rails: None Entrance Stairs-Number of Steps: 2 Home Layout: Two level;Able to live on main level with  bedroom/bathroom Home Equipment: Tub bench;Bedside commode;Cane - single point;Walker - 2 wheels      Prior Function Level of Independence: Independent         Comments: Pt reports only 1 other fall 4-5 weeks ago landing on R hip when weeds got  caught on his foot.     Hand Dominance        Extremity/Trunk Assessment   Upper Extremity Assessment: Overall WFL for tasks assessed           Lower Extremity Assessment:  (R LE WFL; L hip flexion at least 2+/5; L knee flexion/extension at least 3/5; L DF at least 3+/5; L LE limited d/t L hip/thigh pain)      Cervical / Trunk Assessment: Normal  Communication   Communication: No difficulties  Cognition Arousal/Alertness: Awake/alert Behavior During Therapy: WFL for tasks assessed/performed Overall Cognitive Status: Within Functional Limits for tasks assessed                      General Comments General comments (skin integrity, edema, etc.): Pt agreeable to PT session and pt and nursing reports pt was in chair last night.    Exercises Total Joint Exercises Ankle Circles/Pumps: AROM;Strengthening;Both;10 reps;Supine Quad Sets: AROM;Strengthening;Both;10 reps;Supine Short Arc Quad: Strengthening;Both;10 reps;Supine (AROM R; AAROM L) Heel Slides: Strengthening;Both;10 reps;Supine (AROM R; AAROM L) Hip ABduction/ADduction: Strengthening;Both;10 reps;Supine (AROM R; AAROM L)  Pt required vc's for technique of above ex's.   Assessment/Plan    PT Assessment Patient needs continued PT services  PT Problem List Decreased strength;Decreased activity tolerance;Decreased balance;Decreased mobility;Decreased knowledge of use of DME;Decreased knowledge of precautions;Pain          PT Treatment Interventions DME instruction;Gait training;Stair training;Functional mobility training;Therapeutic activities;Therapeutic exercise;Balance training;Patient/family education    PT Goals (Current goals can be found in the Care Plan section)  Acute Rehab PT Goals Patient Stated Goal: to be able to walk better and farther PT Goal Formulation: With patient Time For Goal Achievement: 10/26/16 Potential to Achieve Goals: Good    Frequency BID   Barriers to discharge         Co-evaluation               End of Session Equipment Utilized During Treatment: Gait belt Activity Tolerance: Patient limited by pain Patient left: in chair;with call bell/phone within reach;with chair alarm set;with SCD's reapplied (B heels elevated via pillow) Nurse Communication: Mobility status;Precautions;Weight bearing status (Pain during and end of session)         Time: [id number] PT Time Calculation (min) (ACUTE ONLY): 33 min   Charges:   PT Evaluation $PT Eval Low Complexity: 1 Procedure PT Treatments $Therapeutic Exercise: 8-22 mins   PT G CodesLeitha Bleak 10-15-2016, 1:05 PM Leitha Bleak, [place]

## 2016-10-12 NOTE — Progress Notes (Signed)
Subjective: 1 Day Post-Op Procedure(s) (LRB): INTRAMEDULLARY (IM) NAIL FEMORAL (Left) Patient reports pain as mild.   Patient is well, and has had no acute complaints or problems Care management to assist with discharge, SNF vs. HHPT. Negative for chest pain and shortness of breath Fever: no Gastrointestinal:Postivie for nausea  Objective: Vital signs in last 24 hours: Temp:  [96.6 F (35.9 C)-99.9 F (37.7 C)] 99 F (37.2 C) (10/15 0833) Pulse Rate:  [55-92] 68 (10/15 0726) Resp:  [9-20] 18 (10/15 0726) BP: (69-138)/(46-77) 124/71 (10/15 0726) SpO2:  [90 %-100 %] 90 % (10/15 0726)  Intake/Output from previous day:  Intake/Output Summary (Last 24 hours) at 10/12/16 1020 Last data filed at 10/12/16 1017  Gross per 24 hour  Intake          4608.34 ml  Output             4075 ml  Net           533.34 ml    Intake/Output this shift: Total I/O In: 556.7 [P.O.:300; I.V.:256.7] Out: 1200 [Urine:1200]  Labs:  Recent Labs  10/10/16 2119 10/11/16 0337 10/12/16 0428  HGB 15.2 14.0 11.5*    Recent Labs  10/11/16 0337 10/12/16 0428  WBC 11.5* 6.7  RBC 4.17* 3.39*  HCT 40.6 33.3*  PLT 220 153    Recent Labs  10/11/16 0337 10/12/16 0428  NA 133* 136  K 3.1* 3.6  CL 98* 102  CO2 24 30  BUN 9 10  CREATININE 0.57* 0.69  GLUCOSE 120* 125*  CALCIUM 8.5* 8.3*    Recent Labs  10/11/16 0337  INR 1.00     EXAM General - Patient is Alert, Appropriate and Oriented Extremity - ABD soft Sensation intact distally Intact pulses distally Dorsiflexion/Plantar flexion intact Incision: scant drainage Dressing/Incision - blood tinged drainage Motor Function - intact, moving foot and toes well on exam.  Abdomen soft on exam, normal BS.  Past Medical History:  Diagnosis Date  . Anemia   . Arthritis    OA  . Difficulty sleeping   . Diverticulosis 2014  . 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 GERD (gastroesophageal reflux disease)    . GI symptom    had nausea / vomited once / frequent stools / getting better  . Hyperlipidemia   . Hypertension   . Mood changes ([place])   . Thyroid disease    hypo    Assessment/Plan: 1 Day Post-Op Procedure(s) (LRB): INTRAMEDULLARY (IM) NAIL FEMORAL (Left) Active Problems:   Closed intertrochanteric fracture of hip, left, initial encounter ([place])  Estimated body mass index is 32.38 kg/m as calculated from the following:   Height as of this encounter: 5\' 10"  (1.778 m).   Weight as of this encounter: 102.4 kg (225 lb 11.2 oz). Advance diet Up with therapy D/C IV fluids when tolerating PO intake.  Labs reviewed, CBC and BMP ordered for tomorrow morning. Post-op blood loss, 11.5 Hg, will re-check in AM. Will begin working on BM. Care management to assist with discharge.  DVT Prophylaxis - Lovenox, Foot Pumps and TED hose Weight-Bearing as tolerated to left leg  J. Cameron Proud, PA-C [facility] Orthopaedic Surgery 10/12/2016, 10:20 AM

## 2016-10-12 NOTE — Progress Notes (Signed)
This nurse received report from [facility]. Patient reports pain as mild and he is resting quietly. Continue to monitor.

## 2016-10-12 NOTE — Anesthesia Postprocedure Evaluation (Signed)
Anesthesia Post Note  Patient: Eugene White  Procedure(s) Performed: Procedure(s) (LRB): INTRAMEDULLARY (IM) NAIL FEMORAL (Left)  Patient location during evaluation: Nursing Unit Anesthesia Type: Spinal Level of consciousness: awake and alert Pain management: pain level controlled Vital Signs Assessment: post-procedure vital signs reviewed and stable Respiratory status: spontaneous breathing Cardiovascular status: stable Postop Assessment: no headache and no backache    Last Vitals:  Vitals:   10/12/16 0726 10/12/16 0833  BP: 124/71   Pulse: 68   Resp: 18   Temp: (!) 35.9 C 37.2 C    Last Pain:  Vitals:   10/12/16 0833  TempSrc: Oral  PainSc:                  Ryaan Vanwagoner K

## 2016-10-13 ENCOUNTER — Encounter: Payer: Self-pay | Admitting: Surgery

## 2016-10-13 DIAGNOSIS — M1990 Unspecified osteoarthritis, unspecified site: Secondary | ICD-10-CM | POA: Diagnosis not present

## 2016-10-13 DIAGNOSIS — R6889 Other general symptoms and signs: Secondary | ICD-10-CM | POA: Diagnosis not present

## 2016-10-13 DIAGNOSIS — E871 Hypo-osmolality and hyponatremia: Secondary | ICD-10-CM | POA: Diagnosis not present

## 2016-10-13 DIAGNOSIS — M810 Age-related osteoporosis without current pathological fracture: Secondary | ICD-10-CM | POA: Diagnosis not present

## 2016-10-13 DIAGNOSIS — W19XXXD Unspecified fall, subsequent encounter: Secondary | ICD-10-CM | POA: Diagnosis not present

## 2016-10-13 DIAGNOSIS — I10 Essential (primary) hypertension: Secondary | ICD-10-CM | POA: Diagnosis not present

## 2016-10-13 DIAGNOSIS — S72112D Displaced fracture of greater trochanter of left femur, subsequent encounter for closed fracture with routine healing: Secondary | ICD-10-CM | POA: Diagnosis not present

## 2016-10-13 DIAGNOSIS — E559 Vitamin D deficiency, unspecified: Secondary | ICD-10-CM | POA: Diagnosis not present

## 2016-10-13 DIAGNOSIS — S72142A Displaced intertrochanteric fracture of left femur, initial encounter for closed fracture: Secondary | ICD-10-CM | POA: Diagnosis not present

## 2016-10-13 DIAGNOSIS — Z7401 Bed confinement status: Secondary | ICD-10-CM | POA: Diagnosis not present

## 2016-10-13 DIAGNOSIS — E039 Hypothyroidism, unspecified: Secondary | ICD-10-CM | POA: Diagnosis not present

## 2016-10-13 DIAGNOSIS — F329 Major depressive disorder, single episode, unspecified: Secondary | ICD-10-CM | POA: Diagnosis not present

## 2016-10-13 DIAGNOSIS — Z7982 Long term (current) use of aspirin: Secondary | ICD-10-CM | POA: Diagnosis not present

## 2016-10-13 DIAGNOSIS — E785 Hyperlipidemia, unspecified: Secondary | ICD-10-CM | POA: Diagnosis not present

## 2016-10-13 LAB — BASIC METABOLIC PANEL
Anion gap: 6 (ref 5–15)
BUN: 10 mg/dL (ref 6–20)
CALCIUM: 8.3 mg/dL — AB (ref 8.9–10.3)
CHLORIDE: 100 mmol/L — AB (ref 101–111)
CO2: 28 mmol/L (ref 22–32)
CREATININE: 0.6 mg/dL — AB (ref 0.61–1.24)
Glucose, Bld: 119 mg/dL — ABNORMAL HIGH (ref 65–99)
Potassium: 3.3 mmol/L — ABNORMAL LOW (ref 3.5–5.1)
SODIUM: 134 mmol/L — AB (ref 135–145)

## 2016-10-13 LAB — CBC
HCT: 31.7 % — ABNORMAL LOW (ref 40.0–52.0)
Hemoglobin: 11.5 g/dL — ABNORMAL LOW (ref 13.0–18.0)
MCH: 35.1 pg — ABNORMAL HIGH (ref 26.0–34.0)
MCHC: 36.2 g/dL — ABNORMAL HIGH (ref 32.0–36.0)
MCV: 97 fL (ref 80.0–100.0)
PLATELETS: 152 10*3/uL (ref 150–440)
RBC: 3.26 MIL/uL — AB (ref 4.40–5.90)
RDW: 13.3 % (ref 11.5–14.5)
WBC: 8.7 10*3/uL (ref 3.8–10.6)

## 2016-10-13 MED ORDER — ENOXAPARIN SODIUM 40 MG/0.4ML ~~LOC~~ SOLN: 40.0000 mg | SUBCUTANEOUS | 0 refills | Status: DC

## 2016-10-13 MED ORDER — OXYCODONE HCL 5 MG PO TABS: 5.0000 mg | ORAL_TABLET | ORAL | 0 refills | Status: DC | PRN

## 2016-10-13 MED ORDER — HYDROMORPHONE HCL 1 MG/ML IJ SOLN: 1.0000 mg | INTRAMUSCULAR | Status: DC | PRN

## 2016-10-13 MED ORDER — POTASSIUM CHLORIDE CRYS ER 20 MEQ PO TBCR
20.0000 meq | EXTENDED_RELEASE_TABLET | Freq: Once | ORAL | Status: AC
  Administered 2016-10-13: 20 meq via ORAL
  Filled 2016-10-13: qty 1

## 2016-10-13 MED ORDER — LACTULOSE 10 GM/15ML PO SOLN
20.0000 g | Freq: Every day | ORAL | Status: DC | PRN
  Administered 2016-10-13: 20 g via ORAL
  Filled 2016-10-13: qty 30

## 2016-10-13 NOTE — Progress Notes (Signed)
Subjective: 2 Days Post-Op Procedure(s) (LRB): INTRAMEDULLARY (IM) NAIL FEMORAL (Left) Patient reports pain as mild.   Patient is well, and has had no acute complaints or problems Care management to assist with discharge, SNF vs. HHPT. Negative for chest pain and shortness of breath Fever: no Gastrointestinal:Postivie for nausea  Objective: Vital signs in last 24 hours: Temp:  [98.4 F (36.9 C)-99 F (37.2 C)] 98.9 F (37.2 C) (10/16 0453) Pulse Rate:  [73-82] 73 (10/16 0453) Resp:  [20-22] 20 (10/16 0453) BP: (122-131)/(64-77) 131/77 (10/16 0453) SpO2:  [94 %-95 %] 95 % (10/16 0453)  Intake/Output from previous day:  Intake/Output Summary (Last 24 hours) at 10/13/16 0730 Last data filed at 10/12/16 2100  Gross per 24 hour  Intake             1545 ml  Output             3575 ml  Net            -2030 ml    Intake/Output this shift: No intake/output data recorded.  Labs:  Recent Labs  10/10/16 2119 10/11/16 0337 10/12/16 0428 10/13/16 0409  HGB 15.2 14.0 11.5* 11.5*    Recent Labs  10/12/16 0428 10/13/16 0409  WBC 6.7 8.7  RBC 3.39* 3.26*  HCT 33.3* 31.7*  PLT 153 152    Recent Labs  10/12/16 0428 10/13/16 0409  NA 136 134*  K 3.6 3.3*  CL 102 100*  CO2 30 28  BUN 10 10  CREATININE 0.69 0.60*  GLUCOSE 125* 119*  CALCIUM 8.3* 8.3*    Recent Labs  10/11/16 0337  INR 1.00     EXAM General - Patient is Alert, Appropriate and Oriented Extremity - ABD soft Sensation intact distally Intact pulses distally Dorsiflexion/Plantar flexion intact Incision: scant drainage Dressing/Incision - blood tinged drainage Motor Function - intact, moving foot and toes well on exam.  Abdomen soft on exam, normal BS.  Past Medical History:  Diagnosis Date  . Anemia   . Arthritis    OA  . Difficulty sleeping   . Diverticulosis 2014  . 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 GERD (gastroesophageal reflux disease)   . GI  symptom    had nausea / vomited once / frequent stools / getting better  . Hyperlipidemia   . Hypertension   . Mood changes ([place])   . Thyroid disease    hypo    Assessment/Plan: 2 Days Post-Op Procedure(s) (LRB): INTRAMEDULLARY (IM) NAIL FEMORAL (Left) Active Problems:   Closed intertrochanteric fracture of hip, left, initial encounter ([place])  Estimated body mass index is 32.38 kg/m as calculated from the following:   Height as of this encounter: 5\' 10"  (1.778 m).   Weight as of this encounter: 102.4 kg (225 lb 11.2 oz). Up with therapy   Labs reviewed, K+ 3.3 this AM, will add potassium. Hg stable at 11.5 Pt is passing gas, no stomach pain. Plan will be for discharge today. Lovenox 40mg  daily x 14 days. Staples can be removed on 10/24/16, follow-up with [place] in 4-6 weeks. If patient is discharged home, follow-up in 10-14 days for staple removal.  DVT Prophylaxis - Lovenox, Foot Pumps and TED hose Weight-Bearing as tolerated to left leg  J. Cameron Proud, PA-C [facility] Orthopaedic Surgery 10/13/2016, 7:30 AM

## 2016-10-13 NOTE — Final Progress Note (Signed)
[facility] NAME: Eugene White    MR#:  [id number]  DATE OF BIRTH:  1946-11-20  DATE OF ADMISSION:  10/10/2016 ADMITTING PHYSICIAN: Harvie Bridge, DO  DATE OF DISCHARGE: 10/13/16  PRIMARY CARE PHYSICIAN: Vernie Murders, PA    ADMISSION DIAGNOSIS:  Trauma [T14.90XA] Left hip pain [M25.552]  DISCHARGE DIAGNOSIS:  Left Hip Intertrochanteric fracture s/p surgery POD#2  SECONDARY DIAGNOSIS:   Past Medical History:  Diagnosis Date  . Anemia   . Arthritis    OA  . Difficulty sleeping   . Diverticulosis 2014  . 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 GERD (gastroesophageal reflux disease)   . GI symptom    had nausea / vomited once / frequent stools / getting better  . Hyperlipidemia   . Hypertension   . Mood changes ([place])   . Thyroid disease    hypo    HOSPITAL COURSE:  70 y.o.malewith a history of anemia, arthritis, GERD, hyperlipidemia, hypertension and hypothyroidismnow being admitted with: 1. Left intertrochanteric hip fracture after mechanical fall POD #2 -doing well - PT eval noted. await final recommendations -prn pain meds  2. Hyponatremia, mild-gentle IV fluid hydration -resolved  3. History of hypertension-continue Norvasc and Diovan HCT  4. History of hyperlipidemia-continue Lipitor  5. History of ETOH use on daily basis Drinks 2-3 cans of beer daily. No h/o DT's -no s/o WD noted  6. History of GERD-continue Protonix  7. History of arthritis-pain control  8. History of depression continue Effexor  Overall stable. D/c from ortho standpoint. D/w Dr Roland Rack  CONSULTS OBTAINED:  Treatment Team:  [name], MD  DRUG ALLERGIES:   Allergies  Allergen Reactions  . Sulfa Antibiotics Other (See Comments)    unknown    DISCHARGE MEDICATIONS:   Current Discharge Medication List    START taking these medications   Details  enoxaparin  (LOVENOX) 40 MG/0.4ML injection Inject 0.4 mLs (40 mg total) into the skin daily. Qty: 14 Syringe, Refills: 0    oxyCODONE (OXY IR/ROXICODONE) 5 MG immediate release tablet Take 1 tablet (5 mg total) by mouth every 4 (four) hours as needed for moderate pain, severe pain or breakthrough pain. Qty: 30 tablet, Refills: 0      CONTINUE these medications which have NOT CHANGED   Details  amLODipine (NORVASC) 5 MG tablet TAKE 1 TABLET EVERY DAY Qty: 90 tablet, Refills: 4   Associated Diagnoses: Essential hypertension    aspirin EC 81 MG tablet Take 81 mg by mouth daily.    atorvastatin (LIPITOR) 20 MG tablet TAKE 1 TABLET EVERY DAY Qty: 90 tablet, Refills: 4    Cholecalciferol (VITAMIN D) 2000 UNITS CAPS Take 1 capsule by mouth daily.    docusate sodium 100 MG CAPS Take 100 mg by mouth 2 (two) times daily. Qty: 10 capsule, Refills: 0    fluticasone (FLONASE) 50 MCG/ACT nasal spray     levothyroxine (SYNTHROID, LEVOTHROID) 150 MCG tablet TAKE 1 TABLET EVERY DAY Qty: 90 tablet, Refills: 4    meloxicam (MOBIC) 15 MG tablet TAKE 1 TABLET EVERY DAY AS NEEDED Qty: 90 tablet, Refills: 4    Multiple Vitamin (MULTIVITAMIN WITH MINERALS) TABS tablet Take 1 tablet by mouth daily.    pantoprazole (PROTONIX) 40 MG tablet TAKE 1 TABLET EVERY DAY Qty: 90 tablet, Refills: 4   Associated Diagnoses: Gastroesophageal reflux disease, esophagitis presence not specified    polycarbophil (FIBERCON)  625 MG tablet Take 1,250 mg by mouth daily.     valsartan-hydrochlorothiazide (DIOVAN-HCT) 160-25 MG tablet TAKE 1 TABLET EVERY DAY Qty: 90 tablet, Refills: 3    venlafaxine XR (EFFEXOR-XR) 75 MG 24 hr capsule TAKE 1 CAPSULE EVERY DAY Qty: 90 capsule, Refills: 3        If you experience worsening of your admission symptoms, develop shortness of breath, life threatening emergency, suicidal or homicidal thoughts you must seek medical attention immediately by calling 911 or calling your MD immediately  if  symptoms less severe.  You Must read complete instructions/literature along with all the possible adverse reactions/side effects for all the Medicines you take and that have been prescribed to you. Take any new Medicines after you have completely understood and accept all the possible adverse reactions/side effects.   Please note  You were cared for by a hospitalist during your hospital stay. If you have any questions about your discharge medications or the care you received while you were in the hospital after you are discharged, you can call the unit and asked to speak with the hospitalist on call if the hospitalist that took care of you is not available. Once you are discharged, your primary care physician will handle any further medical issues. Please note that NO REFILLS for any discharge medications will be authorized once you are discharged, as it is imperative that you return to your primary care physician (or establish a relationship with a primary care physician if you do not have one) for your aftercare needs so that they can reassess your need for medications and monitor your lab values. Today   SUBJECTIVE   Doing well. Some hip pain with PT  VITAL SIGNS:  Blood pressure (!) 141/73, pulse 71, temperature 98.9 F (37.2 C), temperature source Oral, resp. rate 18, height 5\' 10"  (1.778 m), weight 102.4 kg (225 lb 11.2 oz), SpO2 98 %.  I/O:   Intake/Output Summary (Last 24 hours) at 10/13/16 0740 Last data filed at 10/13/16 0730  Gross per 24 hour  Intake             1645 ml  Output             3575 ml  Net            -1930 ml    PHYSICAL EXAMINATION:  GENERAL:  70 y.o.-year-old patient lying in the bed with no acute distress.  EYES: Pupils equal, round, reactive to light and accommodation. No scleral icterus. Extraocular muscles intact.  HEENT: Head atraumatic, normocephalic. Oropharynx and nasopharynx clear.  NECK:  Supple, no jugular venous distention. No thyroid enlargement,  no tenderness.  LUNGS: Normal breath sounds bilaterally, no wheezing, rales,rhonchi or crepitation. No use of accessory muscles of respiration.  CARDIOVASCULAR: S1, S2 normal. No murmurs, rubs, or gallops.  ABDOMEN: Soft, non-tender, non-distended. Bowel sounds present. No organomegaly or mass.  EXTREMITIES: No pedal edema, cyanosis, or clubbing. Dressing + NEUROLOGIC: Cranial nerves II through XII are intact. Muscle strength 5/5 in all extremities. Sensation intact. Gait not checked.  PSYCHIATRIC: The patient is alert and oriented x 3.  SKIN: No obvious rash, lesion, or ulcer.   DATA REVIEW:   CBC   Recent Labs Lab 10/13/16 0409  WBC 8.7  HGB 11.5*  HCT 31.7*  PLT 152    Chemistries   Recent Labs Lab 10/10/16 2119  10/13/16 0409  NA 131*  < > 134*  K 3.6  < > 3.3*  CL 97*  < >  100*  CO2 21*  < > 28  GLUCOSE 100*  < > 119*  BUN 11  < > 10  CREATININE 0.71  < > 0.60*  CALCIUM 8.8*  < > 8.3*  MG 1.9  --   --   AST 44*  --   --   ALT 36  --   --   ALKPHOS 81  --   --   BILITOT 1.0  --   --   < > = values in this interval not displayed.  Microbiology Results   Recent Results (from the past 240 hour(s))  Surgical pcr screen     Status: None   Collection Time: 10/11/16  1:20 AM  Result Value Ref Range Status   MRSA, PCR NEGATIVE NEGATIVE Final   Staphylococcus aureus NEGATIVE NEGATIVE Final    Comment:        The Xpert SA Assay (FDA approved for NASAL specimens in patients over 39 years of age), is one component of a comprehensive surveillance program.  Test performance has been validated by [facility] for patients greater than or equal to 80 year old. It is not intended to diagnose infection nor to guide or monitor treatment.     RADIOLOGY:  Dg Hip Operative Unilat W Or W/o Pelvis Left  Result Date: 10/11/2016 CLINICAL DATA:  Intra medullary nail placement. EXAM: OPERATIVE LEFT HIP (WITH PELVIS IF PERFORMED)  VIEWS TECHNIQUE: Fluoroscopic spot  image(s) were submitted for interpretation post-operatively. FLUOROSCOPY TIME:  58 seconds IMAGES: 4 COMPARISON:  October 10, 2016 FINDINGS: An intra medullary nail has been placed across the intertrochanteric fracture. An intra medullary rod is seen within the femur, affixed distally with an interlocking screw. Hardware is in good position. IMPRESSION: Appropriate placement of hardware across the left intertrochanteric fracture. Electronically Signed   By: Dorise Bullion III M.D   On: 10/11/2016 12:35     Management plans discussed with the patient, family and they are in agreement.  CODE STATUS:     Code Status Orders        Start     Ordered   10/11/16 0034  Full code  Continuous     10/11/16 0033    Code Status History    Date Active Date Inactive Code Status Order ID Comments User Context   01/09/2015 12:09 PM 01/10/2015  6:11 PM Full Code [id number]  Benedetto Goad, PA-C Inpatient    Advance Directive Documentation   Flowsheet Row Most Recent Value  Type of Advance Directive  Healthcare Power of Attorney, Living will  Pre-existing out of facility DNR order (yellow form or pink MOST form)  No data  "MOST" Form in Place?  No data      TOTAL TIME TAKING CARE OF THIS PATIENT: 40 minutes.    Kandee Escalante M.D on 10/13/2016 at 7:40 AM  Between 7am to 6pm - Pager - [phone] After 6pm go to [email or web address] - password [email or web address] [facility]  [facility]  Office  [phone]  CC: Primary care physician; Vernie Murders, PA

## 2016-10-13 NOTE — Discharge Instructions (Signed)
PT

## 2016-10-13 NOTE — Progress Notes (Signed)
Chaplain rounded patient who was with his wife at the time of this visit. Pt talked to the chaplain about his stone collection hobby and traveling and was very animated talking about things he loves. His wife often kicked clarifying things that Pt said to chaplain. Patient was in good spirit and asked for prayers to find a good rehab for him and for great health which was provided.    10/13/16 1100  Clinical Encounter Type  Visited With Patient  Visit Type Initial;Spiritual support  Referral From Nurse  Spiritual Encounters  Spiritual Needs Prayer

## 2016-10-13 NOTE — Progress Notes (Signed)
Physical Therapy Treatment Patient Details Name: Eugene White MRN: [id number] DOB: 23-Mar-1946 Today's Date: 10/13/2016    History of Present Illness Pt is a 70 y.o. male s/p fall onto L hip (got foot caught when barbecuing; ETOH on board).  Pt sustained comminuted L femoral intertrochanteric fx with displaced lesser trochanteric fragment and mild lateral displacement of distal femur.  Pt s/p ORIF L hip fx 10/11/16.  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    PT Comments    Pt is eager to participate in physical therapy session. Pt completed all supine therapeutic exercises well with min assist for initiation of L hip AROM. Pt bed mobility +1 min assist requiring min vc's and bed rail use in order to complete. Pt transfer sit to stand x2 trials requiring +1 min assist to initiate and pt bears all weight through R LE . Pt reports min lightheadedness with transfers. Pt then able to ambulate CGA 20 ft using RW with increased L hip pain 9-10/10 and increasing lightheadedness and nausea. Pt required sitting break in recliner with BP noted at 97/69. Pt seated rest break for approximately 5 minutes and BP re-checked at 105/62 (RN notified). Pt O2 throughout session 92% or greater and HR between 72-88 bpm. Pt will benefit from STR to address noted impariments. Next session pt will benefit from progressing therapeutic exercises and ambulation distances as medically appropriate.    Follow Up Recommendations  SNF     Equipment Recommendations  Rolling walker with 5" wheels (Pt owns RW)    Recommendations for Other Services       Precautions / Restrictions Precautions Precautions: Fall Restrictions Weight Bearing Restrictions: Yes LLE Weight Bearing: Weight bearing as tolerated    Mobility  Bed Mobility Overal bed mobility: Needs Assistance Bed Mobility: Supine to Sit      Supine to sit: HOB elevated;Min assist     General bed mobility comments: increase time, heavy bed rail use, assist with L LE and min vc's required for technique  Transfers Overall transfer level: Needs assistance Equipment used: Rolling walker (2 wheeled) Transfers: Sit to/from Stand Sit to Stand: Min assist         General transfer comment: x 2 trials; pt reports lightheadedness 1st trial that improves sitting approximately 30 seconds; pt reports min lightheadedness 2nd trial; min assist to initiate standing each time; full loading through R LE during transfers; increased time to perform  Ambulation/Gait Ambulation/Gait assistance: Min guard;+2 safety/equipment Ambulation Distance (Feet): 20 Feet Assistive device: Rolling walker (2 wheeled) Gait Pattern/deviations: Step-to pattern;Antalgic;Decreased stance time - left Gait velocity: decreased   General Gait Details: min vc's to maintain inside RW; increased L hip pain and light headedness requiring chair sitting rest break and activity ceased with BP 97/69   Stairs            Wheelchair Mobility    Modified Rankin (Stroke Patients Only)       Balance Overall balance assessment: Needs assistance Sitting-balance support: Bilateral upper extremity supported;Feet supported Sitting balance-Leahy Scale: Fair Sitting balance - Comments: R lean to off-weight L hip pain Postural control: Right lateral lean Standing balance support: Bilateral upper extremity supported;During functional activity Standing balance-Leahy Scale: Fair Standing balance comment: CGA at end of sit to stand transfer due to unsteadiness transitioning hand placement to RW and correcting flexed posture  Cognition Arousal/Alertness: Awake/alert Behavior During Therapy: WFL for tasks assessed/performed Overall Cognitive Status: Within Functional Limits for tasks assessed                      Exercises Total Joint  Exercises Ankle Circles/Pumps: AROM;Strengthening;Both;20 reps;Supine Quad Sets: AROM;Strengthening;Left;15 reps;Supine Heel Slides: Strengthening;AROM;Left;10 reps;Supine (required min assist to initiate first 2 reps; progressively increased ROM throughout reps) Hip ABduction/ADduction: Strengthening;Left;10 reps;Supine;AAROM (assist to elevate L LE off of bed to complete)    General Comments General comments (skin integrity, edema, etc.): Pt is agreeable to PT session. RN notified of changes in BP during session. Incision sites at beginning of session had minimal dried drainage.       Pertinent Vitals/Pain Pain Assessment: 0-10 Pain Score: 9  (L Hip = 9-10/10 ambulating; 0/10 at rest; L knee = 3/10) Pain Location: L hip/thigh and L knee Pain Descriptors / Indicators: Sore Pain Intervention(s): Limited activity within patient's tolerance;Monitored during session;Premedicated before session;Repositioned    Home Living                      Prior Function            PT Goals (current goals can now be found in the care plan section) Acute Rehab PT Goals Patient Stated Goal: to feel better so that he can walk better/further PT Goal Formulation: With patient Time For Goal Achievement: 10/26/16 Potential to Achieve Goals: Good Progress towards PT goals: Not progressing toward goals - comment (Pt progressing HEP; however requiring more assist and limtied activity due to medical status at this time)    Frequency    BID      PT Plan Current plan remains appropriate    Co-evaluation             End of Session Equipment Utilized During Treatment: Gait belt Activity Tolerance: Patient limited by pain (Pt limited due to lightheadedness and nausea) Patient left: in chair;with call bell/phone within reach;with chair alarm set;with SCD's reapplied (B heels suspended on pillow; ice pack applied to L hip incision site)     Time: [id number] PT Time Calculation (min)  (ACUTE ONLY): 32 min  Charges:                       G CodesRiley Nearing, SPT 10/13/2016, 12:01 PM

## 2016-10-13 NOTE — Clinical Social Work Note (Signed)
Clinical Social Work Assessment  Patient Details  Name: Eugene White MRN: [id number] Date of Birth: Feb 17, 1946  Date of referral:  10/13/16               Reason for consult:  Discharge Planning                Permission sought to share information with:  Family Supports Permission granted to share information::  Yes, Verbal Permission Granted  Name::        Agency::     Relationship::   (Wife)  Contact Information:     Housing/Transportation Living arrangements for the past 2 months:  Single Family Home Source of Information:  Patient, Spouse Patient Interpreter Needed:  None Criminal Activity/Legal Involvement Pertinent to Current Situation/Hospitalization:  No - Comment as needed Significant Relationships:  Other Family Members, Friend, Spouse Lives with:  Spouse Do you feel safe going back to the place where you live?  No Need for family participation in patient care:  Yes (Comment) (Wife)  Care giving concerns:  PT recommended STR   Social Worker assessment / plan:  CSW met with patient and his wife at bedside. Introduced herself and her role. Patient and his wife are in agreement for patient to go to STR at discharge. Granted CSW v er bal permission to send SNF referral to SNFs in [place]. Presented bed offers accepted bed at [facility]. Josem Kaufmann #[id number]. Discharging to [employer] [facility] via EMS. RN to call report.   Employment status:  Retired Nurse, adult PT Recommendations:  [place] / Referral to community resources:  [place]  Patient/Family's Response to care:  Patient to discharge to [facility] today   Patient/Family's Understanding of and Emotional Response to Diagnosis, Current Treatment, and Prognosis:  Reported that they understood. Thanked CSW for assistance.   Emotional Assessment Appearance:  Appears stated age Attitude/Demeanor/Rapport:   (None) Affect  (typically observed):  Accepting Orientation:  Oriented to Self, Oriented to Place, Oriented to  Time, Oriented to Situation Alcohol / Substance use:  Not Applicable Psych involvement (Current and /or in the community):  No (Comment)  Discharge Needs  Concerns to be addressed:  Discharge Planning Concerns Readmission within the last 30 days:  No Current discharge risk:  Chronically ill Barriers to Discharge:  Continued Medical Work up   [employer], [place] 10/13/2016, 4:38 PM

## 2016-10-13 NOTE — NC FL2 (Signed)
[place] LEVEL OF CARE SCREENING TOOL     IDENTIFICATION  Patient Name: Eugene White Birthdate: 1946/06/23 Sex: male Admission Date (Current Location): 10/10/2016  [place] and [place] Number:  Engineering geologist and Address:  [facility], [address] [place], [place] [address]      Provider Number: [id number]  Attending Physician Name and Address:  Fritzi Mandes, MD  Relative Name and Phone Number:       Current Level of Care: Hospital Recommended Level of Care: [place] Prior Approval Number:    Date Approved/Denied:   PASRR Number: [id number]  Discharge Plan: SNF    Current Diagnoses: Patient Active Problem List   Diagnosis Date Noted  . Closed intertrochanteric fracture of hip, left, initial encounter ([place]) 10/10/2016  . Esophagitis, reflux 06/27/2015  . Contracture of palmar fascia (Dupuytren's) 06/27/2015  . Arthritis of knee 06/27/2015  . Arthropathia 06/27/2015  . Dupuytren's disease of palm 06/27/2015  . S/P right TKA 01/09/2015  . DJD (degenerative joint disease) of knee 01/09/2015  . Arthritis of knee, degenerative 01/09/2015  . History of artificial joint 01/09/2015  . Fever, unspecified 06/02/2014  . Loss of weight 06/02/2014  . Leukocytosis, unspecified 06/02/2014  . Abdominal pain, unspecified site 06/02/2014  . Anemia 06/02/2014  . Hypercholesterolemia without hypertriglyceridemia 05/31/2009  . 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 Benign essential HTN 05/31/2009  . Colon, diverticulosis 05/31/2009  . Clinical depression 05/31/2009  . Diverticulitis large intestine 05/31/2009  . Essential (primary) hypertension 05/31/2009  . Major depressive disorder with single episode 05/31/2009  . Pure hypercholesterolemia 05/31/2009    Orientation RESPIRATION BLADDER Height & Weight     Self, Time, Situation, Place  Normal Continent Weight: 225 lb 11.2 oz (102.4 kg) Height:  5\' 10"  (177.8 cm)   BEHAVIORAL SYMPTOMS/MOOD NEUROLOGICAL BOWEL NUTRITION STATUS   (None)  (None) Continent Diet (Regular)  AMBULATORY STATUS COMMUNICATION OF NEEDS Skin   Extensive Assist Verbally Other (Comment)                       Personal Care Assistance Level of Assistance  Bathing, Feeding, Dressing Bathing Assistance: Limited assistance Feeding assistance: Independent Dressing Assistance: Limited assistance     Functional Limitations Info  Sight, Hearing, Speech Sight Info: Impaired Hearing Info: Adequate Speech Info: Adequate    SPECIAL CARE FACTORS FREQUENCY  PT (By licensed PT)     PT Frequency:  (5)              Contractures      Additional Factors Info  Code Status, Allergies Code Status Info:  (Full Code)             Current Medications (10/13/2016):  This is the current hospital active medication list Current Facility-Administered Medications  Medication Dose Route Frequency Provider Last Rate Last Dose  . acetaminophen (TYLENOL) tablet 650 mg  650 mg Oral Q6H PRN [name], MD   650 mg at 10/12/16 2117   Or  . acetaminophen (TYLENOL) suppository 650 mg  650 mg Rectal Q6H PRN [name], MD      . amLODipine (NORVASC) tablet 5 mg  5 mg Oral Daily Alexis Hugelmeyer, DO   5 mg at 10/13/16 0931  . atorvastatin (LIPITOR) tablet 20 mg  20 mg Oral Daily Alexis Hugelmeyer, DO   20 mg at 10/13/16 0932  . bisacodyl (DULCOLAX) EC tablet 5 mg  5 mg Oral Daily PRN [employer],  DO      . bisacodyl (DULCOLAX) suppository 10 mg  10 mg Rectal Daily PRN [name], MD      . cholecalciferol (VITAMIN D) tablet 2,000 Units  2,000 Units Oral Daily Alexis Hugelmeyer, DO   2,000 Units at 10/13/16 0930  . diphenhydrAMINE (BENADRYL) 12.5 MG/5ML elixir 12.5-25 mg  12.5-25 mg Oral Q4H PRN [name], MD   12.5 mg at 10/11/16 2208  . docusate sodium (COLACE) capsule 100 mg  100 mg Oral BID [name], MD   100 mg at 10/13/16 0932  . enoxaparin (LOVENOX) injection 40 mg   40 mg Subcutaneous Q24H [name], MD   40 mg at 10/13/16 0932  . fluticasone (FLONASE) 50 MCG/ACT nasal spray 1 spray  1 spray Each Nare Daily Alexis Hugelmeyer, DO   1 spray at 10/11/16 0959  . irbesartan (AVAPRO) tablet 150 mg  150 mg Oral Daily Alexis Hugelmeyer, DO   150 mg at 10/13/16 0931   And  . hydrochlorothiazide (HYDRODIURIL) tablet 25 mg  25 mg Oral Daily Alexis Hugelmeyer, DO   25 mg at 10/13/16 0931  . HYDROmorphone (DILAUDID) injection 1-2 mg  1-2 mg Intravenous Q4H PRN Fritzi Mandes, MD      . lactulose (CHRONULAC) 10 GM/15ML solution 20 g  20 g Oral Daily PRN Fritzi Mandes, MD   20 g at 10/13/16 0932  . levothyroxine (SYNTHROID, LEVOTHROID) tablet 150 mcg  150 mcg Oral QAC breakfast Alexis Hugelmeyer, DO   150 mcg at 10/13/16 0932  . magnesium citrate solution 1 Bottle  1 Bottle Oral Once PRN Alexis Hugelmeyer, DO      . magnesium hydroxide (MILK OF MAGNESIA) suspension 30 mL  30 mL Oral Daily PRN [name], MD   30 mL at 10/12/16 1720  . metoCLOPramide (REGLAN) tablet 5-10 mg  5-10 mg Oral Q8H PRN [name], MD       Or  . metoCLOPramide (REGLAN) injection 5-10 mg  5-10 mg Intravenous Q8H PRN [name], MD      . multivitamin with minerals tablet 1 tablet  1 tablet Oral Daily Alexis Hugelmeyer, DO   1 tablet at 10/13/16 0931  . ondansetron (ZOFRAN) tablet 4 mg  4 mg Oral Q6H PRN [name], MD       Or  . ondansetron [facility]) injection 4 mg  4 mg Intravenous Q6H PRN [name], MD      . oxyCODONE (Oxy IR/ROXICODONE) immediate release tablet 5-10 mg  5-10 mg Oral Q3H PRN [name], MD   10 mg at 10/13/16 0930  . pantoprazole (PROTONIX) EC tablet 40 mg  40 mg Oral QAC breakfast Alexis Hugelmeyer, DO   40 mg at 10/13/16 0931  . polycarbophil (FIBERCON) tablet 1,250 mg  1,250 mg Oral Daily Alexis Hugelmeyer, DO   1,250 mg at 10/13/16 0932  . sodium phosphate (FLEET) 7-19 GM/118ML enema 1 enema  1 enema Rectal Once PRN [name], MD      . venlafaxine XR  (EFFEXOR-XR) 24 hr capsule 75 mg  75 mg Oral Daily Alexis Hugelmeyer, DO   75 mg at 10/13/16 0942  . zolpidem (AMBIEN) tablet 5 mg  5 mg Oral QHS PRN Alexis Hugelmeyer, DO   5 mg at 10/11/16 0209     Discharge Medications: Please see discharge summary for a list of discharge medications.  Relevant Imaging Results:  Relevant Lab Results:   Additional Information SSN  [id number]  [place], LCSW

## 2016-10-13 NOTE — Care Management (Signed)
Spoke with patient and he feels that he will need to go to a SNF for rehab. He states he is not familiar with any other than [facility]. He is leaving it up to his wife. He states PT has not seen him today but feels that he will still need to go to rehab at [facility]. RNCM will follow for HHPT.

## 2016-10-13 NOTE — Progress Notes (Signed)
RN gave report to RN at [facility]. EMS called for transport. All questions answered.   Deri Fuelling, RN

## 2016-10-14 NOTE — Discharge Summary (Signed)
[place] [facility] [place] NAME: Eugene White    MR#:  [id number]  DATE OF BIRTH:  Mar 02, 1946  DATE OF ADMISSION:  10/10/2016  ADMITTING PHYSICIAN: Harvie Bridge, DO  DATE OF DISCHARGE: 10/13/16  PRIMARY CARE PHYSICIAN: Vernie Murders, PA    ADMISSION DIAGNOSIS:  Trauma [T14.90XA] Left hip pain [M25.552]  DISCHARGE DIAGNOSIS:  Left Hip Intertrochanteric fracture s/p surgery POD#2  SECONDARY DIAGNOSIS:       Past Medical History:  Diagnosis Date  . Anemia   . Arthritis    OA  . Difficulty sleeping   . Diverticulosis 2014  . 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 GERD (gastroesophageal reflux disease)   . GI symptom    had nausea / vomited once / frequent stools / getting better  . Hyperlipidemia   . Hypertension   . Mood changes ([place])   . Thyroid disease    hypo    HOSPITAL COURSE:  70 y.o.malewith a history of anemia, arthritis, GERD, hyperlipidemia, hypertension and hypothyroidismnow being admitted with: 1. Left intertrochanteric hip fracture after mechanical fall POD #2 -doing well - PT eval noted. await final recommendations -prn pain meds  2. Hyponatremia, mild-gentle IV fluid hydration -resolved  3. History of hypertension-continue Norvasc and Diovan HCT  4. History of hyperlipidemia-continue Lipitor  5. History of ETOH use on daily basis Drinks 2-3 cans of beer daily. No h/o DT's -no s/o WD noted  6. History of GERD-continue Protonix  7. History of arthritis-pain control  8. History of depression continue Effexor  Overall stable. D/c from ortho standpoint. D/w Dr Roland Rack  CONSULTS OBTAINED:  Treatment Team:  [name], MD  DRUG ALLERGIES:        Allergies  Allergen Reactions  . Sulfa Antibiotics Other (See Comments)    unknown    DISCHARGE MEDICATIONS:       Current Discharge Medication List         START taking these medications   Details  enoxaparin (LOVENOX) 40 MG/0.4ML injection Inject 0.4 mLs (40 mg total) into the skin daily. Qty: 14 Syringe, Refills: 0    oxyCODONE (OXY IR/ROXICODONE) 5 MG immediate release tablet Take 1 tablet (5 mg total) by mouth every 4 (four) hours as needed for moderate pain, severe pain or breakthrough pain. Qty: 30 tablet, Refills: 0          CONTINUE these medications which have NOT CHANGED   Details  amLODipine (NORVASC) 5 MG tablet TAKE 1 TABLET EVERY DAY Qty: 90 tablet, Refills: 4   Associated Diagnoses: Essential hypertension    aspirin EC 81 MG tablet Take 81 mg by mouth daily.    atorvastatin (LIPITOR) 20 MG tablet TAKE 1 TABLET EVERY DAY Qty: 90 tablet, Refills: 4    Cholecalciferol (VITAMIN D) 2000 UNITS CAPS Take 1 capsule by mouth daily.    docusate sodium 100 MG CAPS Take 100 mg by mouth 2 (two) times daily. Qty: 10 capsule, Refills: 0    fluticasone (FLONASE) 50 MCG/ACT nasal spray     levothyroxine (SYNTHROID, LEVOTHROID) 150 MCG tablet TAKE 1 TABLET EVERY DAY Qty: 90 tablet, Refills: 4    meloxicam (MOBIC) 15 MG tablet TAKE 1 TABLET EVERY DAY AS NEEDED Qty: 90 tablet, Refills: 4    Multiple Vitamin (MULTIVITAMIN WITH MINERALS) TABS tablet Take 1 tablet by mouth daily.    pantoprazole (PROTONIX) 40 MG tablet TAKE 1 TABLET EVERY  DAY Qty: 90 tablet, Refills: 4   Associated Diagnoses: Gastroesophageal reflux disease, esophagitis presence not specified    polycarbophil (FIBERCON) 625 MG tablet Take 1,250 mg by mouth daily.     valsartan-hydrochlorothiazide (DIOVAN-HCT) 160-25 MG tablet TAKE 1 TABLET EVERY DAY Qty: 90 tablet, Refills: 3    venlafaxine XR (EFFEXOR-XR) 75 MG 24 hr capsule TAKE 1 CAPSULE EVERY DAY Qty: 90 capsule, Refills: 3        If you experience worsening of your admission symptoms, develop shortness of breath, life threatening emergency, suicidal or homicidal thoughts you must  seek medical attention immediately by calling 911 or calling your MD immediately  if symptoms less severe.  You Must read complete instructions/literature along with all the possible adverse reactions/side effects for all the Medicines you take and that have been prescribed to you. Take any new Medicines after you have completely understood and accept all the possible adverse reactions/side effects.   Please note  You were cared for by a hospitalist during your hospital stay. If you have any questions about your discharge medications or the care you received while you were in the hospital after you are discharged, you can call the unit and asked to speak with the hospitalist on call if the hospitalist that took care of you is not available. Once you are discharged, your primary care physician will handle any further medical issues. Please note that NO REFILLS for any discharge medications will be authorized once you are discharged, as it is imperative that you return to your primary care physician (or establish a relationship with a primary care physician if you do not have one) for your aftercare needs so that they can reassess your need for medications and monitor your lab values. Today   SUBJECTIVE   Doing well. Some hip pain with PT  VITAL SIGNS:  Blood pressure (!) 141/73, pulse 71, temperature 98.9 F (37.2 C), temperature source Oral, resp. rate 18, height 5\' 10"  (1.778 m), weight 102.4 kg (225 lb 11.2 oz), SpO2 98 %.  I/O:   Intake/Output Summary (Last 24 hours) at 10/13/16 0740 Last data filed at 10/13/16 0730  Gross per 24 hour  Intake             1645 ml  Output             3575 ml  Net            -1930 ml    PHYSICAL EXAMINATION:  GENERAL:  70 y.o.-year-old patient lying in the bed with no acute distress.  EYES: Pupils equal, round, reactive to light and accommodation. No scleral icterus. Extraocular muscles intact.  HEENT: Head atraumatic, normocephalic.  Oropharynx and nasopharynx clear.  NECK:  Supple, no jugular venous distention. No thyroid enlargement, no tenderness.  LUNGS: Normal breath sounds bilaterally, no wheezing, rales,rhonchi or crepitation. No use of accessory muscles of respiration.  CARDIOVASCULAR: S1, S2 normal. No murmurs, rubs, or gallops.  ABDOMEN: Soft, non-tender, non-distended. Bowel sounds present. No organomegaly or mass.  EXTREMITIES: No pedal edema, cyanosis, or clubbing. Dressing + NEUROLOGIC: Cranial nerves II through XII are intact. Muscle strength 5/5 in all extremities. Sensation intact. Gait not checked.  PSYCHIATRIC: The patient is alert and oriented x 3.  SKIN: No obvious rash, lesion, or ulcer.   DATA REVIEW:   CBC   Last Labs    Recent Labs Lab 10/13/16 0409  WBC 8.7  HGB 11.5*  HCT 31.7*  PLT 152  Chemistries   Last Labs    Recent Labs Lab 10/10/16 2119  10/13/16 0409  NA 131*  < > 134*  K 3.6  < > 3.3*  CL 97*  < > 100*  CO2 21*  < > 28  GLUCOSE 100*  < > 119*  BUN 11  < > 10  CREATININE 0.71  < > 0.60*  CALCIUM 8.8*  < > 8.3*  MG 1.9  --   --   AST 44*  --   --   ALT 36  --   --   ALKPHOS 81  --   --   BILITOT 1.0  --   --   < > = values in this interval not displayed.    Microbiology Results          Recent Results (from the past 240 hour(s))  Surgical pcr screen     Status: None   Collection Time: 10/11/16  1:20 AM  Result Value Ref Range Status   MRSA, PCR NEGATIVE NEGATIVE Final   Staphylococcus aureus NEGATIVE NEGATIVE Final    Comment:        The Xpert SA Assay (FDA approved for NASAL specimens in patients over 24 years of age), is one component of a comprehensive surveillance program.  Test performance has been validated by [facility] for patients greater than or equal to 29 year old. It is not intended to diagnose infection nor to guide or monitor treatment.    RADIOLOGY:   Imaging Results (Last 48 hours)  Dg Hip Operative  Unilat W Or W/o Pelvis Left  Result Date: 10/11/2016 CLINICAL DATA:  Intra medullary nail placement. EXAM: OPERATIVE LEFT HIP (WITH PELVIS IF PERFORMED)  VIEWS TECHNIQUE: Fluoroscopic spot image(s) were submitted for interpretation post-operatively. FLUOROSCOPY TIME:  58 seconds IMAGES: 4 COMPARISON:  October 10, 2016 FINDINGS: An intra medullary nail has been placed across the intertrochanteric fracture. An intra medullary rod is seen within the femur, affixed distally with an interlocking screw. Hardware is in good position. IMPRESSION: Appropriate placement of hardware across the left intertrochanteric fracture. Electronically Signed   By: Dorise Bullion III M.D   On: 10/11/2016 12:35      Management plans discussed with the patient, family and they are in agreement.  CODE STATUS:          Code Status Orders             Start     Ordered   10/11/16 0034  Full code  Continuous     10/11/16 0033                      Code Status History    Date Active Date Inactive Code Status Order ID Comments User Context   01/09/2015 12:09 PM 01/10/2015  6:11 PM Full Code [id number]  Benedetto Goad, PA-C Inpatient          Advance Directive Documentation   Flowsheet Row Most Recent Value  Type of Advance Directive  Healthcare Power of Attorney, Living will  Pre-existing out of facility DNR order (yellow form or pink MOST form)  No data  "MOST" Form in Place?  No data      TOTAL TIME TAKING CARE OF THIS PATIENT: 40 minutes.    Garrell Flagg M.D on 10/13/2016 at 7:40 AM  Between 7am to 6pm - Pager - [phone] After 6pm go to [email or web address] - [facility]  Office  [phone]  CC: Primary care physician; Vernie Murders, PA

## 2016-10-23 DIAGNOSIS — E039 Hypothyroidism, unspecified: Secondary | ICD-10-CM | POA: Diagnosis not present

## 2016-10-23 DIAGNOSIS — I10 Essential (primary) hypertension: Secondary | ICD-10-CM | POA: Diagnosis not present

## 2016-10-23 DIAGNOSIS — S72142D Displaced intertrochanteric fracture of left femur, subsequent encounter for closed fracture with routine healing: Secondary | ICD-10-CM | POA: Diagnosis not present

## 2016-10-23 DIAGNOSIS — D649 Anemia, unspecified: Secondary | ICD-10-CM | POA: Diagnosis not present

## 2016-10-23 DIAGNOSIS — M1991 Primary osteoarthritis, unspecified site: Secondary | ICD-10-CM | POA: Diagnosis not present

## 2016-10-24 DIAGNOSIS — D649 Anemia, unspecified: Secondary | ICD-10-CM | POA: Diagnosis not present

## 2016-10-24 DIAGNOSIS — I10 Essential (primary) hypertension: Secondary | ICD-10-CM | POA: Diagnosis not present

## 2016-10-24 DIAGNOSIS — M1991 Primary osteoarthritis, unspecified site: Secondary | ICD-10-CM | POA: Diagnosis not present

## 2016-10-24 DIAGNOSIS — S72142D Displaced intertrochanteric fracture of left femur, subsequent encounter for closed fracture with routine healing: Secondary | ICD-10-CM | POA: Diagnosis not present

## 2016-10-24 DIAGNOSIS — E039 Hypothyroidism, unspecified: Secondary | ICD-10-CM | POA: Diagnosis not present

## 2016-10-27 ENCOUNTER — Encounter: Payer: Self-pay | Admitting: Family Medicine

## 2016-10-27 ENCOUNTER — Telehealth: Payer: Self-pay

## 2016-10-27 ENCOUNTER — Ambulatory Visit (INDEPENDENT_AMBULATORY_CARE_PROVIDER_SITE_OTHER): Payer: Commercial Managed Care - HMO | Admitting: Family Medicine

## 2016-10-27 VITALS — BP 130/88 | HR 93 | Temp 97.7°F | Resp 16

## 2016-10-27 DIAGNOSIS — E7800 Pure hypercholesterolemia, unspecified: Secondary | ICD-10-CM | POA: Diagnosis not present

## 2016-10-27 DIAGNOSIS — I10 Essential (primary) hypertension: Secondary | ICD-10-CM | POA: Diagnosis not present

## 2016-10-27 DIAGNOSIS — Z8781 Personal history of (healed) traumatic fracture: Secondary | ICD-10-CM

## 2016-10-27 DIAGNOSIS — E039 Hypothyroidism, unspecified: Secondary | ICD-10-CM

## 2016-10-27 NOTE — Progress Notes (Signed)
Patient: Eugene White Male    DOB: [birth year]   70 y.o.   MRN: [id number] Visit Date: 10/27/2016  Today's Provider: Vernie Murders, PA   Chief Complaint  Patient presents with  . Hospitalization Follow-up   Subjective:    HPI  Follow up Hospitalization  Patient was admitted to [facility] on 10/10/16 and discharged on 10/13/16. He was treated for Hip Fracture. Treatment for this included started on Enoxapanin 40 mg an oxycodone 5 mg. Had Intramedullary nail femoral surgery on 10/14. He reports good compliance with treatment. He reports this condition is better.  ------------------------------------------------------------------------------------   Patient Active Problem List   Diagnosis Date Noted  . Closed intertrochanteric fracture of hip, left, initial encounter ([place]) 10/10/2016  . Esophagitis, reflux 06/27/2015  . Contracture of palmar fascia (Dupuytren's) 06/27/2015  . Arthritis of knee 06/27/2015  . Arthropathia 06/27/2015  . Dupuytren's disease of palm 06/27/2015  . S/P right TKA 01/09/2015  . DJD (degenerative joint disease) of knee 01/09/2015  . Arthritis of knee, degenerative 01/09/2015  . History of artificial joint 01/09/2015  . Fever, unspecified 06/02/2014  . Loss of weight 06/02/2014  . Leukocytosis, unspecified 06/02/2014  . Abdominal pain, unspecified site 06/02/2014  . Anemia 06/02/2014  . Hypercholesterolemia without hypertriglyceridemia 05/31/2009  . 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 Benign essential HTN 05/31/2009  . Colon, diverticulosis 05/31/2009  . Clinical depression 05/31/2009  . Diverticulitis large intestine 05/31/2009  . Essential (primary) hypertension 05/31/2009  . Major depressive disorder with single episode 05/31/2009  . Pure hypercholesterolemia 05/31/2009   Past Surgical History:  Procedure Laterality Date  . COLONOSCOPY  2014  . FEMUR IM NAIL Left 10/11/2016   Procedure: INTRAMEDULLARY (IM) NAIL FEMORAL;  Surgeon:  [name], MD;  Location: [facility] ORS;  Service: Orthopedics;  Laterality: Left;  . HAND SURGERY   1973 / 2010 / 2015   right to release tendons  . KNEE ARTHROSCOPY  1984 & 2001   twice  . NASAL SEPTUM SURGERY    . [place] & 2006   Right and left  . TOTAL KNEE ARTHROPLASTY Right 01/09/2015   Procedure: RIGHT TOTAL KNEE ARTHROPLASTY;  Surgeon: [name], MD;  Location: [facility];  Service: Orthopedics;  Laterality: Right;   Family History  Problem Relation Age of Onset  . Breast cancer Mother   . Colon cancer Neg Hx   . Esophageal cancer Neg Hx   . Stomach cancer Neg Hx   . Rectal cancer Neg Hx       Allergies  Allergen Reactions  . Sulfa Antibiotics Other (See Comments)    unknown     Current Outpatient Prescriptions:  .  amLODipine (NORVASC) 5 MG tablet, TAKE 1 TABLET EVERY DAY, Disp: 90 tablet, Rfl: 4 .  aspirin EC 81 MG tablet, Take 81 mg by mouth daily., Disp: , Rfl:  .  atorvastatin (LIPITOR) 20 MG tablet, TAKE 1 TABLET EVERY DAY, Disp: 90 tablet, Rfl: 4 .  Cholecalciferol (VITAMIN D) 2000 UNITS CAPS, Take 1 capsule by mouth daily., Disp: , Rfl:  .  docusate sodium 100 MG CAPS, Take 100 mg by mouth 2 (two) times daily., Disp: 10 capsule, Rfl: 0 .  enoxaparin (LOVENOX) 40 MG/0.4ML injection, Inject 0.4 mLs (40 mg total) into the skin daily., Disp: 14 Syringe, Rfl: 0 .  fluticasone (FLONASE) 50 MCG/ACT nasal spray, , Disp: , Rfl:  .  levothyroxine (SYNTHROID, LEVOTHROID) 150 MCG tablet,  TAKE 1 TABLET EVERY DAY, Disp: 90 tablet, Rfl: 4 .  meloxicam (MOBIC) 15 MG tablet, TAKE 1 TABLET EVERY DAY AS NEEDED, Disp: 90 tablet, Rfl: 4 .  Multiple Vitamin (MULTIVITAMIN WITH MINERALS) TABS tablet, Take 1 tablet by mouth daily., Disp: , Rfl:  .  oxyCODONE (OXY IR/ROXICODONE) 5 MG immediate release tablet, Take 1 tablet (5 mg total) by mouth every 4 (four) hours as needed for moderate pain, severe pain or breakthrough pain., Disp: 30 tablet, Rfl: 0 .   pantoprazole (PROTONIX) 40 MG tablet, TAKE 1 TABLET EVERY DAY, Disp: 90 tablet, Rfl: 4 .  polycarbophil (FIBERCON) 625 MG tablet, Take 1,250 mg by mouth daily. , Disp: , Rfl:  .  valsartan-hydrochlorothiazide (DIOVAN-HCT) 160-25 MG tablet, TAKE 1 TABLET EVERY DAY, Disp: 90 tablet, Rfl: 3 .  venlafaxine XR (EFFEXOR-XR) 75 MG 24 hr capsule, TAKE 1 CAPSULE EVERY DAY, Disp: 90 capsule, Rfl: 3  Review of Systems  Cardiovascular: Negative for chest pain and palpitations.    Social History  Substance Use Topics  . Smoking status: Former Smoker    Quit date: 12/29/1968  . Smokeless tobacco: Never Used  . Alcohol use 6.0 oz/week    10 Cans of beer per week   Objective:   BP 130/88 (BP Location: Right Arm, Patient Position: Sitting, Cuff Size: Large)   Pulse 93   Temp 97.7 F (36.5 C) (Oral)   Resp 16   Physical Exam  Constitutional: He is oriented to person, place, and time. He appears well-developed and well-nourished. No distress.  HENT:  Head: Normocephalic and atraumatic.  Right Ear: Hearing normal.  Left Ear: Hearing normal.  Nose: Nose normal.  Eyes: Conjunctivae and lids are normal. Right eye exhibits no discharge. Left eye exhibits no discharge. No scleral icterus.  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  Cardiovascular: Normal rate and regular rhythm.   Pulmonary/Chest: Effort normal and breath sounds normal. No respiratory distress.  Abdominal: Soft.  Musculoskeletal: He exhibits tenderness.  Tender left hip with recent fracture and femoral nailing. Good pulses. Bruising left posterior and lateral calf. No lymphangitis or erythema. Walking with walker - slowly. Left knee arthritis pain further limiting ability to ambulate.  Neurological: He is alert and oriented to person, place, and time.  Skin: Skin is intact. No lesion and no rash noted.  Psychiatric: He has a normal mood and affect. His speech is normal and behavior is normal. Thought content normal.       Assessment & Plan:     1. S/p left hip fracture [place] at home when left arthritic knee gave away on 10-10-16. Dr. Roland Rack repaired closed intertrochanteric fracture with femoral nailing and long rod. Most worrisome pain and compromise to ambulation is coming from the severe arthritis in the left knee. Will proceed with follow up this afternoon with surgeon.  2. Essential (primary) hypertension Stable. Will get follow up labs and continue Amlodipine and Diovan-HCT daily. - CBC with Differential/Platelet - Comprehensive metabolic panel - TSH  3. Adult hypothyroidism Tolerating Leveothyroxine 150 mcg qd. No palpitations or tremors. Will recheck labs. - Comprehensive metabolic panel - T4 - TSH  4. Hypercholesterolemia without hypertriglyceridemia Very high triglycerides (471) during hospitalization for left hip fracture. Continue Lipitor and low fat diet. Follow up in 3 months. - Comprehensive metabolic panel - TSH       Vernie Murders, PA  [facility]

## 2016-10-27 NOTE — Telephone Encounter (Signed)
Eugene White from [facility] care is requesting a verbal order for PT for a total of 6 visits.  CB# [phone]

## 2016-10-27 NOTE — Telephone Encounter (Signed)
Give verbal order to proceed with 6 total PT visits for post op hip fracture fixation.

## 2016-10-28 ENCOUNTER — Telehealth: Payer: Self-pay

## 2016-10-28 DIAGNOSIS — D649 Anemia, unspecified: Secondary | ICD-10-CM | POA: Diagnosis not present

## 2016-10-28 DIAGNOSIS — S72142D Displaced intertrochanteric fracture of left femur, subsequent encounter for closed fracture with routine healing: Secondary | ICD-10-CM | POA: Diagnosis not present

## 2016-10-28 DIAGNOSIS — M1991 Primary osteoarthritis, unspecified site: Secondary | ICD-10-CM | POA: Diagnosis not present

## 2016-10-28 DIAGNOSIS — E039 Hypothyroidism, unspecified: Secondary | ICD-10-CM | POA: Diagnosis not present

## 2016-10-28 DIAGNOSIS — I10 Essential (primary) hypertension: Secondary | ICD-10-CM | POA: Diagnosis not present

## 2016-10-28 LAB — COMPREHENSIVE METABOLIC PANEL
ALBUMIN: 4.4 g/dL (ref 3.5–4.8)
ALT: 29 IU/L (ref 0–44)
AST: 21 IU/L (ref 0–40)
Albumin/Globulin Ratio: 1.7 (ref 1.2–2.2)
Alkaline Phosphatase: 179 IU/L — ABNORMAL HIGH (ref 39–117)
BILIRUBIN TOTAL: 0.5 mg/dL (ref 0.0–1.2)
BUN / CREAT RATIO: 19 (ref 10–24)
BUN: 14 mg/dL (ref 8–27)
CALCIUM: 9.8 mg/dL (ref 8.6–10.2)
CHLORIDE: 97 mmol/L (ref 96–106)
CO2: 28 mmol/L (ref 18–29)
Creatinine, Ser: 0.72 mg/dL — ABNORMAL LOW (ref 0.76–1.27)
GFR, EST AFRICAN AMERICAN: 108 mL/min/{1.73_m2} (ref >59)
GFR, EST NON AFRICAN AMERICAN: 94 mL/min/{1.73_m2} (ref >59)
GLUCOSE: 108 mg/dL — AB (ref 65–99)
Globulin, Total: 2.6 g/dL (ref 1.5–4.5)
Potassium: 4.3 mmol/L (ref 3.5–5.2)
Sodium: 139 mmol/L (ref 134–144)
TOTAL PROTEIN: 7 g/dL (ref 6.0–8.5)

## 2016-10-28 LAB — CBC WITH DIFFERENTIAL/PLATELET
BASOS ABS: 0 10*3/uL (ref 0.0–0.2)
Basos: 1 %
EOS (ABSOLUTE): 0.4 10*3/uL (ref 0.0–0.4)
Eos: 6 %
HEMOGLOBIN: 12.5 g/dL — AB (ref 12.6–17.7)
Hematocrit: 37.7 % (ref 37.5–51.0)
IMMATURE GRANS (ABS): 0 10*3/uL (ref 0.0–0.1)
IMMATURE GRANULOCYTES: 1 %
LYMPHS: 25 %
Lymphocytes Absolute: 1.6 10*3/uL (ref 0.7–3.1)
MCH: 32.6 pg (ref 26.6–33.0)
MCHC: 33.2 g/dL (ref 31.5–35.7)
MCV: 98 fL — AB (ref 79–97)
MONOCYTES: 9 %
Monocytes Absolute: 0.6 10*3/uL (ref 0.1–0.9)
NEUTROS ABS: 4 10*3/uL (ref 1.4–7.0)
NEUTROS PCT: 58 %
PLATELETS: 616 10*3/uL — AB (ref 150–379)
RBC: 3.83 x10E6/uL — ABNORMAL LOW (ref 4.14–5.80)
RDW: 13.9 % (ref 12.3–15.4)
WBC: 6.6 10*3/uL (ref 3.4–10.8)

## 2016-10-28 LAB — T4: T4, Total: 6.6 ug/dL (ref 4.5–12.0)

## 2016-10-28 LAB — TSH: TSH: 1.98 u[IU]/mL (ref 0.450–4.500)

## 2016-10-28 NOTE — Telephone Encounter (Signed)
Patient advised. Patient verbalized understanding.  

## 2016-10-28 NOTE — Telephone Encounter (Signed)
Eugene White advised as below.

## 2016-10-28 NOTE — Telephone Encounter (Signed)
-----   Message from [name], [place] sent at 10/28/2016  8:14 AM EDT ----- Potassium back to normal and good kidney and liver function. Alkaline phosphatase probably elevated due to fracture and bone surgery. Hgb and Hct coming back to normal. Platelets are high and need to take ASA daily if finished with Lovenox. Recheck platelets in 1 month.

## 2016-10-30 DIAGNOSIS — E039 Hypothyroidism, unspecified: Secondary | ICD-10-CM | POA: Diagnosis not present

## 2016-10-30 DIAGNOSIS — M1991 Primary osteoarthritis, unspecified site: Secondary | ICD-10-CM | POA: Diagnosis not present

## 2016-10-30 DIAGNOSIS — D649 Anemia, unspecified: Secondary | ICD-10-CM | POA: Diagnosis not present

## 2016-10-30 DIAGNOSIS — I10 Essential (primary) hypertension: Secondary | ICD-10-CM | POA: Diagnosis not present

## 2016-10-30 DIAGNOSIS — S72142D Displaced intertrochanteric fracture of left femur, subsequent encounter for closed fracture with routine healing: Secondary | ICD-10-CM | POA: Diagnosis not present

## 2016-11-04 DIAGNOSIS — E039 Hypothyroidism, unspecified: Secondary | ICD-10-CM | POA: Diagnosis not present

## 2016-11-04 DIAGNOSIS — S72142D Displaced intertrochanteric fracture of left femur, subsequent encounter for closed fracture with routine healing: Secondary | ICD-10-CM | POA: Diagnosis not present

## 2016-11-04 DIAGNOSIS — D649 Anemia, unspecified: Secondary | ICD-10-CM | POA: Diagnosis not present

## 2016-11-04 DIAGNOSIS — I10 Essential (primary) hypertension: Secondary | ICD-10-CM | POA: Diagnosis not present

## 2016-11-04 DIAGNOSIS — M1991 Primary osteoarthritis, unspecified site: Secondary | ICD-10-CM | POA: Diagnosis not present

## 2016-11-05 ENCOUNTER — Telehealth: Payer: Self-pay | Admitting: Family Medicine

## 2016-11-05 DIAGNOSIS — M25562 Pain in left knee: Secondary | ICD-10-CM | POA: Diagnosis not present

## 2016-11-05 DIAGNOSIS — M1712 Unilateral primary osteoarthritis, left knee: Secondary | ICD-10-CM | POA: Diagnosis not present

## 2016-11-05 DIAGNOSIS — G8929 Other chronic pain: Secondary | ICD-10-CM | POA: Diagnosis not present

## 2016-11-05 DIAGNOSIS — M25552 Pain in left hip: Secondary | ICD-10-CM | POA: Diagnosis not present

## 2016-11-05 NOTE — Telephone Encounter (Signed)
Lemuel with [facility] wanted to advise that he is discharging pt from physical therapy at home per pt's request. Thanks TNP

## 2016-11-06 DIAGNOSIS — I10 Essential (primary) hypertension: Secondary | ICD-10-CM | POA: Diagnosis not present

## 2016-11-06 DIAGNOSIS — E039 Hypothyroidism, unspecified: Secondary | ICD-10-CM | POA: Diagnosis not present

## 2016-11-06 DIAGNOSIS — S72142D Displaced intertrochanteric fracture of left femur, subsequent encounter for closed fracture with routine healing: Secondary | ICD-10-CM | POA: Diagnosis not present

## 2016-11-06 DIAGNOSIS — D649 Anemia, unspecified: Secondary | ICD-10-CM | POA: Diagnosis not present

## 2016-11-06 DIAGNOSIS — M1991 Primary osteoarthritis, unspecified site: Secondary | ICD-10-CM | POA: Diagnosis not present

## 2016-11-11 DIAGNOSIS — F329 Major depressive disorder, single episode, unspecified: Secondary | ICD-10-CM | POA: Diagnosis not present

## 2016-11-11 DIAGNOSIS — M1991 Primary osteoarthritis, unspecified site: Secondary | ICD-10-CM | POA: Diagnosis not present

## 2016-11-11 DIAGNOSIS — I10 Essential (primary) hypertension: Secondary | ICD-10-CM | POA: Diagnosis not present

## 2016-11-11 DIAGNOSIS — E039 Hypothyroidism, unspecified: Secondary | ICD-10-CM | POA: Diagnosis not present

## 2016-11-11 DIAGNOSIS — D649 Anemia, unspecified: Secondary | ICD-10-CM | POA: Diagnosis not present

## 2016-11-11 DIAGNOSIS — S72142D Displaced intertrochanteric fracture of left femur, subsequent encounter for closed fracture with routine healing: Secondary | ICD-10-CM | POA: Diagnosis not present

## 2016-11-12 DIAGNOSIS — M25552 Pain in left hip: Secondary | ICD-10-CM | POA: Diagnosis not present

## 2016-11-12 DIAGNOSIS — S72142D Displaced intertrochanteric fracture of left femur, subsequent encounter for closed fracture with routine healing: Secondary | ICD-10-CM | POA: Diagnosis not present

## 2016-11-14 DIAGNOSIS — M25552 Pain in left hip: Secondary | ICD-10-CM | POA: Diagnosis not present

## 2016-11-14 DIAGNOSIS — S72142D Displaced intertrochanteric fracture of left femur, subsequent encounter for closed fracture with routine healing: Secondary | ICD-10-CM | POA: Diagnosis not present

## 2016-11-17 DIAGNOSIS — Z967 Presence of other bone and tendon implants: Secondary | ICD-10-CM | POA: Diagnosis not present

## 2016-11-17 DIAGNOSIS — Z8781 Personal history of (healed) traumatic fracture: Secondary | ICD-10-CM | POA: Diagnosis not present

## 2016-11-18 DIAGNOSIS — M25552 Pain in left hip: Secondary | ICD-10-CM | POA: Diagnosis not present

## 2016-11-18 DIAGNOSIS — S72142D Displaced intertrochanteric fracture of left femur, subsequent encounter for closed fracture with routine healing: Secondary | ICD-10-CM | POA: Diagnosis not present

## 2016-11-21 DIAGNOSIS — S72142D Displaced intertrochanteric fracture of left femur, subsequent encounter for closed fracture with routine healing: Secondary | ICD-10-CM | POA: Diagnosis not present

## 2016-11-21 DIAGNOSIS — M25552 Pain in left hip: Secondary | ICD-10-CM | POA: Diagnosis not present

## 2016-11-27 DIAGNOSIS — M25552 Pain in left hip: Secondary | ICD-10-CM | POA: Diagnosis not present

## 2016-11-27 DIAGNOSIS — S72142D Displaced intertrochanteric fracture of left femur, subsequent encounter for closed fracture with routine healing: Secondary | ICD-10-CM | POA: Diagnosis not present

## 2016-12-01 DIAGNOSIS — H2513 Age-related nuclear cataract, bilateral: Secondary | ICD-10-CM | POA: Diagnosis not present

## 2016-12-31 DIAGNOSIS — S72142D Displaced intertrochanteric fracture of left femur, subsequent encounter for closed fracture with routine healing: Secondary | ICD-10-CM | POA: Diagnosis not present

## 2017-02-12 ENCOUNTER — Other Ambulatory Visit: Payer: Self-pay | Admitting: Family Medicine

## 2017-03-14 ENCOUNTER — Other Ambulatory Visit: Payer: Self-pay | Admitting: Physician Assistant

## 2017-03-14 ENCOUNTER — Other Ambulatory Visit: Payer: Self-pay | Admitting: Family Medicine

## 2017-03-14 DIAGNOSIS — K219 Gastro-esophageal reflux disease without esophagitis: Secondary | ICD-10-CM

## 2017-03-14 DIAGNOSIS — I10 Essential (primary) hypertension: Secondary | ICD-10-CM

## 2017-03-16 NOTE — Telephone Encounter (Signed)
Last ov 10/27/16 Last filled 12/27/15 Please review. Thank you. sd

## 2017-03-18 DIAGNOSIS — G8929 Other chronic pain: Secondary | ICD-10-CM | POA: Diagnosis not present

## 2017-03-18 DIAGNOSIS — M25562 Pain in left knee: Secondary | ICD-10-CM | POA: Diagnosis not present

## 2017-03-18 DIAGNOSIS — M1712 Unilateral primary osteoarthritis, left knee: Secondary | ICD-10-CM | POA: Diagnosis not present

## 2017-03-18 DIAGNOSIS — M7062 Trochanteric bursitis, left hip: Secondary | ICD-10-CM | POA: Diagnosis not present

## 2017-06-03 DIAGNOSIS — M79605 Pain in left leg: Secondary | ICD-10-CM | POA: Diagnosis not present

## 2017-06-03 DIAGNOSIS — S76312A Strain of muscle, fascia and tendon of the posterior muscle group at thigh level, left thigh, initial encounter: Secondary | ICD-10-CM | POA: Diagnosis not present

## 2017-06-10 DIAGNOSIS — M79652 Pain in left thigh: Secondary | ICD-10-CM | POA: Diagnosis not present

## 2017-07-28 ENCOUNTER — Telehealth: Payer: Self-pay | Admitting: Family Medicine

## 2017-07-28 NOTE — Telephone Encounter (Signed)
Left message about need to schedule AWV-ab °

## 2017-07-29 DIAGNOSIS — M7062 Trochanteric bursitis, left hip: Secondary | ICD-10-CM | POA: Diagnosis not present

## 2017-07-29 DIAGNOSIS — M25562 Pain in left knee: Secondary | ICD-10-CM | POA: Diagnosis not present

## 2017-07-29 DIAGNOSIS — G8929 Other chronic pain: Secondary | ICD-10-CM | POA: Diagnosis not present

## 2017-07-29 DIAGNOSIS — M1712 Unilateral primary osteoarthritis, left knee: Secondary | ICD-10-CM | POA: Diagnosis not present

## 2017-07-29 DIAGNOSIS — M25552 Pain in left hip: Secondary | ICD-10-CM | POA: Diagnosis not present

## 2017-08-18 DIAGNOSIS — S60222A Contusion of left hand, initial encounter: Secondary | ICD-10-CM | POA: Diagnosis not present

## 2017-09-15 ENCOUNTER — Ambulatory Visit (INDEPENDENT_AMBULATORY_CARE_PROVIDER_SITE_OTHER): Payer: Medicare HMO

## 2017-09-15 ENCOUNTER — Ambulatory Visit: Payer: Medicare HMO | Admitting: Family Medicine

## 2017-09-15 ENCOUNTER — Encounter: Payer: Self-pay | Admitting: Family Medicine

## 2017-09-15 VITALS — BP 132/82 | HR 96 | Temp 99.1°F | Ht 70.0 in | Wt 220.0 lb

## 2017-09-15 DIAGNOSIS — Z23 Encounter for immunization: Secondary | ICD-10-CM

## 2017-09-15 DIAGNOSIS — Z0000 Encounter for general adult medical examination without abnormal findings: Secondary | ICD-10-CM

## 2017-09-15 DIAGNOSIS — E7800 Pure hypercholesterolemia, unspecified: Secondary | ICD-10-CM

## 2017-09-15 DIAGNOSIS — F329 Major depressive disorder, single episode, unspecified: Secondary | ICD-10-CM

## 2017-09-15 DIAGNOSIS — K2100 Gastro-esophageal reflux disease with esophagitis, without bleeding: Secondary | ICD-10-CM

## 2017-09-15 DIAGNOSIS — F32A Depression, unspecified: Secondary | ICD-10-CM

## 2017-09-15 DIAGNOSIS — M1712 Unilateral primary osteoarthritis, left knee: Secondary | ICD-10-CM

## 2017-09-15 DIAGNOSIS — E039 Hypothyroidism, unspecified: Secondary | ICD-10-CM

## 2017-09-15 DIAGNOSIS — Z9660 Presence of unspecified orthopedic joint implant: Secondary | ICD-10-CM

## 2017-09-15 DIAGNOSIS — I10 Essential (primary) hypertension: Secondary | ICD-10-CM

## 2017-09-15 NOTE — Progress Notes (Signed)
Subjective:   Eugene White is a 71 y.o. male who presents for Medicare Annual/Subsequent preventive examination.  Review of Systems:  N/A  Cardiac Risk Factors include: advanced age (>50men, >37 women);male gender;hypertension;dyslipidemia;obesity (BMI >30kg/m2);sedentary lifestyle     Objective:    Vitals: BP 132/82 (BP Location: Left Arm)   Pulse 96   Temp 99.1 F (37.3 C) (Oral)   Ht 5\' 10"  (1.778 m)   Wt 220 lb (99.8 kg)   BMI 31.57 kg/m   Body mass index is 31.57 kg/m.  Tobacco History  Smoking Status  . Former Smoker  . Quit date: 12/29/1968  Smokeless Tobacco  . Never Used     Counseling given: Not Answered   Past Medical History:  Diagnosis Date  . Anemia   . Arthritis    OA  . Difficulty sleeping   . Diverticulosis 2014  . 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 GERD (gastroesophageal reflux disease)   . GI symptom    had nausea / vomited once / frequent stools / getting better  . Hyperlipidemia   . Hypertension   . Mood changes ([place])   . Thyroid disease    hypo   Past Surgical History:  Procedure Laterality Date  . COLONOSCOPY  2014  . FEMUR IM NAIL Left 10/11/2016   Procedure: INTRAMEDULLARY (IM) NAIL FEMORAL;  Surgeon: [name], MD;  Location: [facility] ORS;  Service: Orthopedics;  Laterality: Left;  . HAND SURGERY   1973 / 2010 / 2015   right to release tendons  . KNEE ARTHROSCOPY  1984 & 2001   twice  . NASAL SEPTUM SURGERY    . [place] & 2006   Right and left  . TOTAL KNEE ARTHROPLASTY Right 01/09/2015   Procedure: RIGHT TOTAL KNEE ARTHROPLASTY;  Surgeon: [name], MD;  Location: [facility];  Service: Orthopedics;  Laterality: Right;   Family History  Problem Relation Age of Onset  . Breast cancer Mother   . Colon cancer Neg Hx   . Esophageal cancer Neg Hx   . Stomach cancer Neg Hx   . Rectal cancer Neg Hx    History  Sexual Activity  . Sexual activity: Not on file     Outpatient Encounter Prescriptions as of 09/15/2017  Medication Sig  . amLODipine (NORVASC) 5 MG tablet TAKE 1 TABLET EVERY DAY  . aspirin EC 81 MG tablet Take 81 mg by mouth daily.  Marland Kitchen atorvastatin (LIPITOR) 20 MG tablet TAKE 1 TABLET EVERY DAY  . Cholecalciferol (VITAMIN D) 2000 UNITS CAPS Take 1 capsule by mouth daily.  Marland Kitchen levothyroxine (SYNTHROID, LEVOTHROID) 150 MCG tablet TAKE 1 TABLET EVERY DAY  . meloxicam (MOBIC) 15 MG tablet TAKE 1 TABLET EVERY DAY AS NEEDED  . Multiple Vitamin (MULTIVITAMIN WITH MINERALS) TABS tablet Take 1 tablet by mouth daily.  . Multiple Vitamins-Minerals (ICAPS AREDS 2) CAPS Take by mouth daily.  . pantoprazole (PROTONIX) 40 MG tablet TAKE 1 TABLET EVERY DAY  . polycarbophil (FIBERCON) 625 MG tablet Take 1,250 mg by mouth daily.   . valsartan-hydrochlorothiazide (DIOVAN-HCT) 160-25 MG tablet TAKE 1 TABLET EVERY DAY  . venlafaxine XR (EFFEXOR-XR) 75 MG 24 hr capsule TAKE 1 CAPSULE EVERY DAY  . [DISCONTINUED] docusate sodium 100 MG CAPS Take 100 mg by mouth 2 (two) times daily. (Patient taking differently: Take 100 mg by mouth 2 (two) times daily. )  . [DISCONTINUED] enoxaparin (  LOVENOX) 40 MG/0.4ML injection Inject 0.4 mLs (40 mg total) into the skin daily.  . [DISCONTINUED] fluticasone (FLONASE) 50 MCG/ACT nasal spray   . [DISCONTINUED] oxyCODONE (OXY IR/ROXICODONE) 5 MG immediate release tablet Take 1 tablet (5 mg total) by mouth every 4 (four) hours as needed for moderate pain, severe pain or breakthrough pain.   No facility-administered encounter medications on file as of 09/15/2017.     Activities of Daily Living In your present state of health, do you have any difficulty performing the following activities: 09/15/2017 10/11/2016  Hearing? Y N  Comment tinnitus -  Vision? N N  Difficulty concentrating or making decisions? N N  Walking or climbing stairs? Y Y  Comment broken femur and arthritis in knees -  Dressing or bathing? N N  Doing errands,  shopping? N N  Preparing Food and eating ? N -  Using the Toilet? N -  In the past six months, have you accidently leaked urine? N -  Do you have problems with loss of bowel control? N -  Managing your Medications? N -  Managing your Finances? N -  Housekeeping or managing your Housekeeping? N -  Some recent data might be hidden    Patient Care Team: Chrismon, Vickki Muff, PA as PCP - General (Family Medicine) Dingeldein, Remo Lipps, MD as Consulting Physician (Ophthalmology) [name], MD as Consulting Physician (Orthopedic Surgery)   Assessment:     Exercise Activities and Dietary recommendations Current Exercise Habits: The patient does not participate in regular exercise at present, Exercise limited by: orthopedic condition(s)  Goals    . Increase water intake          Recommend to continue drinking 6-8 glasses of water a day.       Fall Risk Fall Risk  09/15/2017 10/06/2016  Falls in the past year? Yes Yes  Number falls in past yr: 2 or more 1  Injury with Fall? Yes No  Comment broken femur -  Risk Factor Category  High Fall Risk -  Follow up Falls prevention discussed -   Depression Screen PHQ 2/9 Scores 09/15/2017 09/15/2017 10/06/2016  PHQ - 2 Score 0 0 0  PHQ- 9 Score 0 - -    Cognitive Function- Pt declined screening today.         Immunization History  Administered Date(s) Administered  . Influenza, High Dose Seasonal PF 11/01/2015, 09/15/2017  . Influenza-Unspecified 09/28/2013  . Pneumococcal Conjugate-13 09/15/2017  . Pneumococcal Polysaccharide-23 01/07/2013  . Tdap 01/11/2013  . Zoster 01/07/2013   Screening Tests Health Maintenance  Topic Date Due  . Hepatitis C Screening  13-Jan-1946  . TETANUS/TDAP  01/11/2023  . COLONOSCOPY  02/18/2023  . INFLUENZA VACCINE  Completed  . PNA vac Low Risk Adult  Completed      Plan:  I have personally reviewed and addressed the Medicare Annual Wellness questionnaire and have noted the following in the  patient's chart:  A. Medical and social history B. Use of alcohol, tobacco or illicit drugs  C. Current medications and supplements D. Functional ability and status E.  Nutritional status F.  Physical activity G. Advance directives H. List of other physicians I.  Hospitalizations, surgeries, and ER visits in previous 12 months J.  [place] such as hearing and vision if needed, cognitive and depression L. Referrals and appointments - none  In addition, I have reviewed and discussed with patient certain preventive protocols, quality metrics, and best practice recommendations. A written personalized care  plan for preventive services as well as general preventive health recommendations were provided to patient.  See attached scanned questionnaire for additional information.   Signed,  Fabio Neighbors, LPN Nurse Health Advisor   MD Recommendations: None. Pt declined the Hepatitis C blood test today.   Reviewed Nurse Health Advisor's documentation and recommendations. Agree with recommendation and plans.

## 2017-09-15 NOTE — Progress Notes (Signed)
Patient: Eugene White, Male    DOB: 08/02/1946, 71 y.o.   MRN: [id number] Visit Date: 09/15/2017  Today's Provider: Vernie Murders, PA   Chief Complaint  Patient presents with  . Annual Exam   Subjective:    Annual physical exam Eugene White is a 71 y.o. male who presents today for health maintenance and complete physical. He feels well. He reports exercising no regular exercise, but stays active. He reports he is sleeping well.  -----------------------------------------------------------------  Patient had a AWE today with [facility]  Last AWE- 10/06/16 Colonoscopy- 02/18/2013 Tetanus- 01/11/2013 Pneumovax 23- 01/07/13 Prevnar 13- 09/15/17 Flu- 09/15/17   Review of Systems  Constitutional: Negative.   HENT: Negative.   Eyes: Negative.   Respiratory: Negative.   Cardiovascular: Negative.   Gastrointestinal: Negative.   Endocrine: Negative.   Genitourinary: Negative.   Musculoskeletal: Negative.   Skin: Negative.   Allergic/Immunologic: Negative.   Neurological: Negative.   Hematological: Negative.   Psychiatric/Behavioral: Negative.     Social History      He  reports that he quit smoking about 48 years ago. He has never used smokeless tobacco. He reports that he drinks about 7.2 oz of alcohol per week . He reports that he does not use drugs.       Social History   Social History  . Marital status: Married    Spouse name: N/A  . Number of children: N/A  . Years of education: N/A   Occupational History  . retired    Social History Main Topics  . Smoking status: Former Smoker    Quit date: 12/29/1968  . Smokeless tobacco: Never Used  . Alcohol use 7.2 oz/week    12 Cans of beer per week  . Drug use: No  . Sexual activity: Not Asked   Other Topics Concern  . None   Social History Narrative  . None    Past Medical History:  Diagnosis Date  . Anemia   . Arthritis    OA  . Difficulty sleeping   . Diverticulosis 2014  . 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 GERD (gastroesophageal reflux disease)   . GI symptom    had nausea / vomited once / frequent stools / getting better  . Hyperlipidemia   . Hypertension   . Mood changes ([place])   . Thyroid disease    hypo     Patient Active Problem List   Diagnosis Date Noted  . Closed intertrochanteric fracture of hip, left, initial encounter ([place]) 10/10/2016  . Esophagitis, reflux 06/27/2015  . Contracture of palmar fascia (Dupuytren's) 06/27/2015  . Arthritis of knee 06/27/2015  . Arthropathia 06/27/2015  . Dupuytren's disease of palm 06/27/2015  . S/P right TKA 01/09/2015  . DJD (degenerative joint disease) of knee 01/09/2015  . Arthritis of knee, degenerative 01/09/2015  . History of artificial joint 01/09/2015  . Fever, unspecified 06/02/2014  . Loss of weight 06/02/2014  . Leukocytosis, unspecified 06/02/2014  . Abdominal pain, unspecified site 06/02/2014  . Anemia 06/02/2014  . Hypercholesterolemia without hypertriglyceridemia 05/31/2009  . 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 Benign essential HTN 05/31/2009  . Colon, diverticulosis 05/31/2009  . Clinical depression 05/31/2009  . Diverticulitis large intestine 05/31/2009  . Essential (primary) hypertension 05/31/2009  . Major depressive disorder with single episode 05/31/2009  . Pure hypercholesterolemia 05/31/2009    Past Surgical History:  Procedure Laterality Date  . COLONOSCOPY  2014  . FEMUR  IM NAIL Left 10/11/2016   Procedure: INTRAMEDULLARY (IM) NAIL FEMORAL;  Surgeon: [name], MD;  Location: [facility] ORS;  Service: Orthopedics;  Laterality: Left;  . HAND SURGERY   1973 / 2010 / 2015   right to release tendons  . KNEE ARTHROSCOPY  1984 & 2001   twice  . NASAL SEPTUM SURGERY    . [place] & 2006   Right and left  . TOTAL KNEE ARTHROPLASTY Right 01/09/2015   Procedure: RIGHT TOTAL KNEE ARTHROPLASTY;  Surgeon: [name], MD;  Location: [facility]  ORS;  Service: Orthopedics;  Laterality: Right;    Family History        Family Status  Relation Status  . Mother (Not Specified)  . Neg Hx (Not Specified)        His family history includes Breast cancer in his mother.     Allergies  Allergen Reactions  . Sulfa Antibiotics Other (See Comments)    unknown     Current Outpatient Prescriptions:  .  amLODipine (NORVASC) 5 MG tablet, TAKE 1 TABLET EVERY DAY, Disp: 90 tablet, Rfl: 4 .  aspirin EC 81 MG tablet, Take 81 mg by mouth daily., Disp: , Rfl:  .  atorvastatin (LIPITOR) 20 MG tablet, TAKE 1 TABLET EVERY DAY, Disp: 90 tablet, Rfl: 4 .  Cholecalciferol (VITAMIN D) 2000 UNITS CAPS, Take 1 capsule by mouth daily., Disp: , Rfl:  .  levothyroxine (SYNTHROID, LEVOTHROID) 150 MCG tablet, TAKE 1 TABLET EVERY DAY, Disp: 90 tablet, Rfl: 4 .  meloxicam (MOBIC) 15 MG tablet, TAKE 1 TABLET EVERY DAY AS NEEDED, Disp: 90 tablet, Rfl: 4 .  Multiple Vitamin (MULTIVITAMIN WITH MINERALS) TABS tablet, Take 1 tablet by mouth daily., Disp: , Rfl:  .  Multiple Vitamins-Minerals (ICAPS AREDS 2) CAPS, Take by mouth daily., Disp: , Rfl:  .  pantoprazole (PROTONIX) 40 MG tablet, TAKE 1 TABLET EVERY DAY, Disp: 90 tablet, Rfl: 4 .  polycarbophil (FIBERCON) 625 MG tablet, Take 1,250 mg by mouth daily. , Disp: , Rfl:  .  valsartan-hydrochlorothiazide (DIOVAN-HCT) 160-25 MG tablet, TAKE 1 TABLET EVERY DAY, Disp: 90 tablet, Rfl: 3 .  venlafaxine XR (EFFEXOR-XR) 75 MG 24 hr capsule, TAKE 1 CAPSULE EVERY DAY, Disp: 90 capsule, Rfl: 3   Patient Care Team: Kaileigh Viswanathan, Vickki Muff, PA as PCP - General (Family Medicine) Dingeldein, Remo Lipps, MD as Consulting Physician (Ophthalmology) [name], MD as Consulting Physician (Orthopedic Surgery)      Objective:   Vitals: BP 132/82 (BP Location: Right Arm, Patient Position: Sitting, Cuff Size: Normal)   Pulse 96   Temp 99.1 F (37.3 C) (Oral)   Ht 5\' 10"  (1.778 m)   Wt 220 lb (99.8 kg)   BMI 31.57 kg/m     Vitals:   09/15/17 1336  BP: 132/82  Pulse: 96  Temp: 99.1 F (37.3 C)  TempSrc: Oral  Weight: 220 lb (99.8 kg)  Height: 5\' 10"  (1.778 m)    Physical Exam  Constitutional: He is oriented to person, place, and time. He appears well-developed and well-nourished.  HENT:  Head: Normocephalic and atraumatic.  Right Ear: External ear normal.  Left Ear: External ear normal.  Nose: Nose normal.  Mouth/Throat: Oropharynx is clear and moist.  Eyes: Pupils are equal, round, and reactive to light. Conjunctivae and EOM are normal. Right eye exhibits no discharge.  Neck: Normal range of motion. Neck supple. No tracheal deviation present. No thyromegaly present.  Cardiovascular: Normal rate,  regular rhythm, normal heart sounds and intact distal pulses.   No murmur heard. Pulmonary/Chest: Effort normal and breath sounds normal. No respiratory distress. He has no wheezes. He has no rales. He exhibits no tenderness.  Abdominal: Soft. He exhibits no distension and no mass. There is no tenderness. There is no rebound and no guarding.  Musculoskeletal: He exhibits no edema.  Crepitus left knee with good ROM. Well healed scars from right knee replacement in 2016. Normal pulses throughout.   Lymphadenopathy:    He has no cervical adenopathy.  Neurological: He is alert and oriented to person, place, and time. He has normal reflexes. No cranial nerve deficit. He exhibits normal muscle tone.  Ptosis of right eyelids with history of attempted surgical correction. Right facial weakness congenital. Poor balance with left knee DJD.  Skin: Skin is warm and dry. No rash noted. No erythema.  Psychiatric: He has a normal mood and affect. His behavior is normal. Judgment and thought content normal.    Depression Screen PHQ 2/9 Scores 09/15/2017 09/15/2017 10/06/2016  PHQ - 2 Score 0 0 0  PHQ- 9 Score 0 - -    Assessment & Plan:     Routine Health Maintenance and Physical Exam  Exercise Activities and  Dietary recommendations Goals    . Increase water intake          Recommend to continue drinking 6-8 glasses of water a day.        Immunization History  Administered Date(s) Administered  . Influenza, High Dose Seasonal PF 11/01/2015, 09/15/2017  . Influenza-Unspecified 09/28/2013  . Pneumococcal Conjugate-13 09/15/2017  . Pneumococcal Polysaccharide-23 01/07/2013  . Tdap 01/11/2013  . Zoster 01/07/2013    Health Maintenance  Topic Date Due  . Hepatitis C Screening  1946/01/06  . TETANUS/TDAP  01/11/2023  . COLONOSCOPY  02/18/2023  . INFLUENZA VACCINE  Completed  . PNA vac Low Risk Adult  Completed     Discussed health benefits of physical activity, and encouraged him to engage in regular exercise appropriate for his age and condition.    -------------------------------------------------------------------- 1. Medicare annual wellness visit, subsequent General health stable. Balance poor with DJD. Scheduled for left femoral nail removal on 10-05-17 by Dr. [name] in preparation for left knee replacement. Colonoscopy and immunizations up to date. Continue present medications and schedule for fasting labs in a month to follow up diagnoses below.  2. Essential (primary) hypertension Stable and very good control. Continues to take Diovan-HCT and Amlodipine without side effects. Schedule for CBC, CMP and TSH next month.  3. Esophagitis, reflux Well controlled dyspepsia with use of Pantoprazole prn. Schedule CBC and CMP next month. Normal upper endoscopy by Dr. Olevia Perches (GI) in 2015. No melena, hematemesis or abdominal pain today.  4. Adult hypothyroidism Tolerating Levothyroxine 150 mcg qd. No tremor, hair loss, diarrhea, palpitations or weight loss. Schedule for TSH, T4 and T3 next month for follow up.  5. Primary osteoarthritis of left knee Scheduled for follow up with Dr. [name] (orthopedist) in preparation for arthroplasty of the left knee. States he is schedule for left femoral  nail removal before knee surgery on 10-05-17. Feels he must have a referral to Dr. [name] before the surgery for insurance purposes. Order places. - Ambulatory referral to Orthopedic Surgery  6. Depression, unspecified depression type Sleeping well. No spontaneous crying spells. No suicidal ideation. Tolerating the Venlafaxine with good control of depression.  7. Pure hypercholesterolemia Trying to watch diet. Recommend he work on some weight loss. Can't  exercise to help this due to arthritis and need for left knee surgery.  8. History of artificial joint History of left hip joint replacement due to fracture 10-06-17, right knee replacement 01-09-15 and having more issues with the left knee. Proceed with evaluation and surgical plans by Dr. [name] (orthopedic surgeon). - Ambulatory referral to [place]

## 2017-09-15 NOTE — Patient Instructions (Signed)
Eugene White , Thank you for taking time to come for your Medicare Wellness Visit. I appreciate your ongoing commitment to your health goals. Please review the following plan we discussed and let me know if I can assist you in the future.   Screening recommendations/referrals: Colonoscopy: up to date Recommended yearly ophthalmology/optometry visit for glaucoma screening and checkup Recommended yearly dental visit for hygiene and checkup  Vaccinations: Influenza vaccine: completed today Pneumococcal vaccine: completed series today Tdap vaccine: up to date Shingles vaccine: up to date  Advanced directives: Please bring a copy of your POA (Power of Attorney) and/or Living Will to your next appointment.   Conditions/risks identified: Obesity; Recommend to continue drinking 6-8 glasses of water a day.   Next appointment: 2:00 PM  Preventive Care 72 Years and Older, Male Preventive care refers to lifestyle choices and visits with your health care provider that can promote health and wellness. What does preventive care include?  A yearly physical exam. This is also called an annual well check.  Dental exams once or twice a year.  Routine eye exams. Ask your health care provider how often you should have your eyes checked.  Personal lifestyle choices, including:  Daily care of your teeth and gums.  Regular physical activity.  Eating a healthy diet.  Avoiding tobacco and drug use.  Limiting alcohol use.  Practicing safe sex.  Taking low doses of aspirin every day.  Taking vitamin and mineral supplements as recommended by your health care provider. What happens during an annual well check? The services and screenings done by your health care provider during your annual well check will depend on your age, overall health, lifestyle risk factors, and family history of disease. Counseling  Your health care provider may ask you questions about your:  Alcohol use.  Tobacco  use.  Drug use.  Emotional well-being.  Home and relationship well-being.  Sexual activity.  Eating habits.  History of falls.  Memory and ability to understand (cognition).  Work and work Statistician. Screening  You may have the following tests or measurements:  Height, weight, and BMI.  Blood pressure.  Lipid and cholesterol levels. These may be checked every 5 years, or more frequently if you are over 15 years old.  Skin check.  Lung cancer screening. You may have this screening every year starting at age 72 if you have a 30-pack-year history of smoking and currently smoke or have quit within the past 15 years.  Fecal occult blood test (FOBT) of the stool. You may have this test every year starting at age 72.  Flexible sigmoidoscopy or colonoscopy. You may have a sigmoidoscopy every 5 years or a colonoscopy every 10 years starting at age 72.  Prostate cancer screening. Recommendations will vary depending on your family history and other risks.  Hepatitis C blood test.  Hepatitis B blood test.  Sexually transmitted disease (STD) testing.  Diabetes screening. This is done by checking your blood sugar (glucose) after you have not eaten for a while (fasting). You may have this done every 1-3 years.  Abdominal aortic aneurysm (AAA) screening. You may need this if you are a current or former smoker.  Osteoporosis. You may be screened starting at age 72 if you are at high risk. Talk with your health care provider about your test results, treatment options, and if necessary, the need for more tests. Vaccines  Your health care provider may recommend certain vaccines, such as:  Influenza vaccine. This is recommended every year.  Tetanus, diphtheria, and acellular pertussis (Tdap, Td) vaccine. You may need a Td booster every 10 years.  Zoster vaccine. You may need this after age 11.  Pneumococcal 13-valent conjugate (PCV13) vaccine. One dose is recommended after age  6.  Pneumococcal polysaccharide (PPSV23) vaccine. One dose is recommended after age 72. Talk to your health care provider about which screenings and vaccines you need and how often you need them. This information is not intended to replace advice given to you by your health care provider. Make sure you discuss any questions you have with your health care provider. Document Released: 01/11/2016 Document Revised: 09/03/2016 Document Reviewed: 10/16/2015 Elsevier Interactive Patient Education  2017 [place] Prevention in the Home Falls can cause injuries. They can happen to people of all ages. There are many things you can do to make your home safe and to help prevent falls. What can I do on the outside of my home?  Regularly fix the edges of walkways and driveways and fix any cracks.  Remove anything that might make you trip as you walk through a door, such as a raised step or threshold.  Trim any bushes or trees on the path to your home.  Use bright outdoor lighting.  Clear any walking paths of anything that might make someone trip, such as rocks or tools.  Regularly check to see if handrails are loose or broken. Make sure that both sides of any steps have handrails.  Any raised decks and porches should have guardrails on the edges.  Have any leaves, snow, or ice cleared regularly.  Use sand or salt on walking paths during winter.  Clean up any spills in your garage right away. This includes oil or grease spills. What can I do in the bathroom?  Use night lights.  Install grab bars by the toilet and in the tub and shower. Do not use towel bars as grab bars.  Use non-skid mats or decals in the tub or shower.  If you need to sit down in the shower, use a plastic, non-slip stool.  Keep the floor dry. Clean up any water that spills on the floor as soon as it happens.  Remove soap buildup in the tub or shower regularly.  Attach bath mats securely with double-sided  non-slip rug tape.  Do not have throw rugs and other things on the floor that can make you trip. What can I do in the bedroom?  Use night lights.  Make sure that you have a light by your bed that is easy to reach.  Do not use any sheets or blankets that are too big for your bed. They should not hang down onto the floor.  Have a firm chair that has side arms. You can use this for support while you get dressed.  Do not have throw rugs and other things on the floor that can make you trip. What can I do in the kitchen?  Clean up any spills right away.  Avoid walking on wet floors.  Keep items that you use a lot in easy-to-reach places.  If you need to reach something above you, use a strong step stool that has a grab bar.  Keep electrical cords out of the way.  Do not use floor polish or wax that makes floors slippery. If you must use wax, use non-skid floor wax.  Do not have throw rugs and other things on the floor that can make you trip. What can I do  with my stairs?  Do not leave any items on the stairs.  Make sure that there are handrails on both sides of the stairs and use them. Fix handrails that are broken or loose. Make sure that handrails are as long as the stairways.  Check any carpeting to make sure that it is firmly attached to the stairs. Fix any carpet that is loose or worn.  Avoid having throw rugs at the top or bottom of the stairs. If you do have throw rugs, attach them to the floor with carpet tape.  Make sure that you have a light switch at the top of the stairs and the bottom of the stairs. If you do not have them, ask someone to add them for you. What else can I do to help prevent falls?  Wear shoes that:  Do not have high heels.  Have rubber bottoms.  Are comfortable and fit you well.  Are closed at the toe. Do not wear sandals.  If you use a stepladder:  Make sure that it is fully opened. Do not climb a closed stepladder.  Make sure that both  sides of the stepladder are locked into place.  Ask someone to hold it for you, if possible.  Clearly mark and make sure that you can see:  Any grab bars or handrails.  First and last steps.  Where the edge of each step is.  Use tools that help you move around (mobility aids) if they are needed. These include:  Canes.  Walkers.  Scooters.  Crutches.  Turn on the lights when you go into a dark area. Replace any light bulbs as soon as they burn out.  Set up your furniture so you have a clear path. Avoid moving your furniture around.  If any of your floors are uneven, fix them.  If there are any pets around you, be aware of where they are.  Review your medicines with your doctor. Some medicines can make you feel dizzy. This can increase your chance of falling. Ask your doctor what other things that you can do to help prevent falls. This information is not intended to replace advice given to you by your health care provider. Make sure you discuss any questions you have with your health care provider. Document Released: 10/11/2009 Document Revised: 05/22/2016 Document Reviewed: 01/19/2015 Elsevier Interactive Patient Education  2017 [employer].

## 2017-09-23 NOTE — Progress Notes (Signed)
ekg 10/11/17 epic lov 09/15/17 epic family Dr.

## 2017-09-23 NOTE — H&P (Signed)
Eugene White is an 71 y.o. male.    Chief Complaint:  S/P left femoral nailing with knee OA / pain  Procedure:  Removal of left femoral nail  HPI: Pt is a 71 y.o. male complaining of left knee pain for years. Pain had continually increased since the beginning.  Patient had a femoral nailing in 09/2016.  He feels that he has healed from this surgery, but does have some pain in the area of the proximal bolt.  Patient also has primary OA of the left knee which is giving him more issues.  Discussion with Dr. [name] and the patient led to removal of the left femoral nail and eventually proceed with a left TKA once the leg has healed.   X-rays in the clinic show end-stage arthritic changes of the left knee as well a left femoral nail.  Pt has tried various conservative treatments which have failed to alleviate their symptoms, including NSAIDs/analgesic medications, activity modifications and assistance devices . Various options are discussed with the patient. Risks, benefits and expectations were discussed with the patient. Patient understand the risks, benefits and expectations and wishes to proceed with surgery.    PCP: [name], PA  D/C Plans:       Home   Post-op Meds:       No Rx given   Tranexamic Acid:      To be given - IV  Decadron:      Is to be given  FYI:     ASA   [place]  DME:   Pt already has equipment  PT:    No PT    PMH: Past Medical History:  Diagnosis Date  . Anemia   . Arthritis    OA  . Difficulty sleeping   . Diverticulosis 2014  . 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 GERD (gastroesophageal reflux disease)   . GI symptom    had nausea / vomited once / frequent stools / getting better  . Hyperlipidemia   . Hypertension   . Mood changes ([place])   . Thyroid disease    hypo    PSH: Past Surgical History:  Procedure Laterality Date  . COLONOSCOPY  2014  . FEMUR IM NAIL Left 10/11/2016   Procedure: INTRAMEDULLARY  (IM) NAIL FEMORAL;  Surgeon: [name], MD;  Location: [facility] ORS;  Service: Orthopedics;  Laterality: Left;  . HAND SURGERY   1973 / 2010 / 2015   right to release tendons  . KNEE ARTHROSCOPY  1984 & 2001   twice  . NASAL SEPTUM SURGERY    . [place] & 2006   Right and left  . 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 He has never used smokeless tobacco. He reports that he drinks about 7.2 oz of alcohol per week . He reports that he does not use drugs.  Allergies:  Allergies  Allergen Reactions  . Sulfa Antibiotics Other (See Comments)    unknown    Medications: No current facility-administered medications for this encounter.    Current Outpatient Prescriptions  Medication Sig Dispense Refill  . amLODipine (NORVASC) 5 MG tablet TAKE 1 TABLET EVERY DAY 90 tablet 4  .  aspirin EC 81 MG tablet Take 81 mg by mouth daily.    Marland Kitchen atorvastatin (LIPITOR) 20 MG tablet TAKE 1 TABLET EVERY DAY 90 tablet 4  . Cholecalciferol (VITAMIN D) 2000 UNITS CAPS Take 1 capsule by mouth daily.    Marland Kitchen levothyroxine (SYNTHROID, LEVOTHROID) 150 MCG tablet TAKE 1 TABLET EVERY DAY 90 tablet 4  . meloxicam (MOBIC) 15 MG tablet TAKE 1 TABLET EVERY DAY AS NEEDED 90 tablet 4  . Multiple Vitamin (MULTIVITAMIN WITH MINERALS) TABS tablet Take 1 tablet by mouth daily.    . Multiple Vitamins-Minerals (ICAPS AREDS 2) CAPS Take by mouth daily.    . pantoprazole (PROTONIX) 40 MG tablet TAKE 1 TABLET EVERY DAY 90 tablet 4  . polycarbophil (FIBERCON) 625 MG tablet Take 1,250 mg by mouth daily.     . valsartan-hydrochlorothiazide (DIOVAN-HCT) 160-25 MG tablet TAKE 1 TABLET EVERY DAY 90 tablet 3  . venlafaxine XR (EFFEXOR-XR) 75 MG 24 hr capsule TAKE 1 CAPSULE EVERY DAY 90 capsule 3       Review of Systems  Constitutional: Negative.   HENT: Negative.   Eyes: Negative.   Respiratory: Negative.   Cardiovascular: Negative.   Gastrointestinal: Positive for heartburn.  Genitourinary: Negative.   Musculoskeletal: Positive for joint pain.  Skin: Negative.   Neurological: Negative.   Endo/Heme/Allergies: Negative.   Psychiatric/Behavioral: Negative.        Physical Exam  Constitutional: He is oriented to person, place, and time. He appears well-developed.  HENT:  Head: Normocephalic.  Eyes: Pupils are equal, round, and reactive to light.  Neck: Neck supple. No JVD present. No tracheal deviation present. No thyromegaly present.  Cardiovascular: Normal rate, regular rhythm, normal heart sounds and intact distal pulses.   Respiratory: Effort normal and breath sounds normal. No respiratory distress. He has no wheezes.  GI: Soft. There is no tenderness. There is no guarding.  Musculoskeletal:       Left hip: He exhibits decreased range of motion, tenderness, bony tenderness and laceration (healed laceration). He exhibits no swelling and no deformity.       Left knee: He exhibits decreased range of motion, swelling and bony tenderness. He exhibits no ecchymosis, no deformity, no laceration and no erythema. Tenderness found.  Lymphadenopathy:    He has no cervical adenopathy.  Neurological: He is alert and oriented to person, place, and time.  Skin: Skin is warm and dry.  Psychiatric: He has a normal mood and affect.       Assessment/Plan Assessment:   S/P left femoral nailing with knee OA / pain   Plan: Patient will undergo a removal of left femoral nail on 10/05/2017 per Dr. [name] at [facility]. Risks benefits and expectations were discussed with the patient. Patient understand risks, benefits and expectations and wishes to proceed.   West Pugh Hisako Bugh   PA-C  09/23/2017, 11:21 AM

## 2017-09-23 NOTE — Patient Instructions (Signed)
Eugene White  09/23/2017   Your procedure is scheduled on: 10/05/17  Report to [facility] Main  Entrance                Follow signs to Short Stay on first floor at     0515AM   Call this number if you have problems the morning of surgery    [phone]   Remember: ONLY 1 PERSON MAY GO WITH YOU TO SHORT STAY TO GET  READY MORNING OF YOUR SURGERY.  Do not eat food or drink liquids :After Midnight.     Take these medicines the morning of surgery with A SIP OF WATER:  Effexor, protonix, synthyroid, amlodipine, lipitor                                You may not have any metal on your body including hair pins and              piercings  Do not wear jewelry,  lotions, powders or perfumes, deodorant                   Men may shave face and neck.   Do not bring valuables to the hospital. [place].  Contacts, dentures or bridgework may not be worn into surgery.  Leave suitcase in the car. After surgery it may be brought to your room.               Please read over the following fact sheets you were given: _____________________________________________________________________          [facility] - Preparing for Surgery Before surgery, you can play an important role.  Because skin is not sterile, your skin needs to be as free of germs as possible.  You can reduce the number of germs on your skin by washing with CHG (chlorahexidine gluconate) soap before surgery.  CHG is an antiseptic cleaner which kills germs and bonds with the skin to continue killing germs even after washing. Please DO NOT use if you have an allergy to CHG or antibacterial soaps.  If your skin becomes reddened/irritated stop using the CHG and inform your nurse when you arrive at Short Stay. Do not shave (including legs and underarms) for at least 48 hours prior to the first CHG shower.  You may shave your face/neck. Please follow these  instructions carefully:  1.  Shower with CHG Soap the night before surgery and the  morning of Surgery.  2.  If you choose to wash your hair, wash your hair first as usual with your  normal  shampoo.  3.  After you shampoo, rinse your hair and body thoroughly to remove the  shampoo.                           4.  Use CHG as you would any other liquid soap.  You can apply chg directly  to the skin and wash                       Gently with a scrungie or clean washcloth.  5.  Apply the CHG Soap to your body ONLY  FROM THE NECK DOWN.   Do not use on face/ open                           Wound or open sores. Avoid contact with eyes, ears mouth and genitals (private parts).                       Wash face,  Genitals (private parts) with your normal soap.             6.  Wash thoroughly, paying special attention to the area where your surgery  will be performed.  7.  Thoroughly rinse your body with warm water from the neck down.  8.  DO NOT shower/wash with your normal soap after using and rinsing off  the CHG Soap.                9.  Pat yourself dry with a clean towel.            10.  Wear clean pajamas.            11.  Place clean sheets on your bed the night of your first shower and do not  sleep with pets. Day of Surgery : Do not apply any lotions/deodorants the morning of surgery.  Please wear clean clothes to the hospital/surgery center.  FAILURE TO FOLLOW THESE INSTRUCTIONS MAY RESULT IN THE CANCELLATION OF YOUR SURGERY PATIENT SIGNATURE_________________________________  NURSE SIGNATURE__________________________________  ________________________________________________________________________  WHAT IS A BLOOD TRANSFUSION? Blood Transfusion Information  A transfusion is the replacement of blood or some of its parts. Blood is made up of multiple cells which provide different functions.  Red blood cells carry oxygen and are used for blood loss replacement.  White blood cells fight against  infection.  Platelets control bleeding.  Plasma helps clot blood.  Other blood products are available for specialized needs, such as hemophilia or other clotting disorders. BEFORE THE TRANSFUSION  Who gives blood for transfusions?   Healthy volunteers who are fully evaluated to make sure their blood is safe. This is blood bank blood. Transfusion therapy is the safest it has ever been in the practice of medicine. Before blood is taken from a donor, a complete history is taken to make sure that person has no history of diseases nor engages in risky social behavior (examples are intravenous drug use or sexual activity with multiple partners). The donor's travel history is screened to minimize risk of transmitting infections, such as malaria. The donated blood is tested for signs of infectious diseases, such as HIV and hepatitis. The blood is then tested to be sure it is compatible with you in order to minimize the chance of a transfusion reaction. If you or a relative donates blood, this is often done in anticipation of surgery and is not appropriate for emergency situations. It takes many days to process the donated blood. RISKS AND COMPLICATIONS Although transfusion therapy is very safe and saves many lives, the main dangers of transfusion include:   Getting an infectious disease.  Developing a transfusion reaction. This is an allergic reaction to something in the blood you were given. Every precaution is taken to prevent this. The decision to have a blood transfusion has been considered carefully by your caregiver before blood is given. Blood is not given unless the benefits outweigh the risks. AFTER THE TRANSFUSION  Right after receiving a blood transfusion, you will usually feel much better  and more energetic. This is especially true if your red blood cells have gotten low (anemic). The transfusion raises the level of the red blood cells which carry oxygen, and this usually causes an energy  increase.  The nurse administering the transfusion will monitor you carefully for complications. HOME CARE INSTRUCTIONS  No special instructions are needed after a transfusion. You may find your energy is better. Speak with your caregiver about any limitations on activity for underlying diseases you may have. SEEK MEDICAL CARE IF:   Your condition is not improving after your transfusion.  You develop redness or irritation at the intravenous (IV) site. SEEK IMMEDIATE MEDICAL CARE IF:  Any of the following symptoms occur over the next 12 hours:  Shaking chills.  You have a temperature by mouth above 102 F (38.9 C), not controlled by medicine.  Chest, back, or muscle pain.  People around you feel you are not acting correctly or are confused.  Shortness of breath or difficulty breathing.  Dizziness and fainting.  You get a rash or develop hives.  You have a decrease in urine output.  Your urine turns a dark color or changes to pink, red, or brown. Any of the following symptoms occur over the next 10 days:  You have a temperature by mouth above 102 F (38.9 C), not controlled by medicine.  Shortness of breath.  Weakness after normal activity.  The white part of the eye turns yellow (jaundice).  You have a decrease in the amount of urine or are urinating less often.  Your urine turns a dark color or changes to pink, red, or brown. Document Released: 12/12/2000 Document Revised: 03/08/2012 Document Reviewed: 07/31/2008 ExitCare Patient Information 2014 [place].  _______________________________________________________________________  Incentive Spirometer  An incentive spirometer is a tool that can help keep your lungs clear and active. This tool measures how well you are filling your lungs with each breath. Taking long deep breaths may help reverse or decrease the chance of developing breathing (pulmonary) problems (especially infection) following:  A long  period of time when you are unable to move or be active. BEFORE THE PROCEDURE   If the spirometer includes an indicator to show your best effort, your nurse or respiratory therapist will set it to a desired goal.  If possible, sit up straight or lean slightly forward. Try not to slouch.  Hold the incentive spirometer in an upright position. INSTRUCTIONS FOR USE  1. Sit on the edge of your bed if possible, or sit up as far as you can in bed or on a chair. 2. Hold the incentive spirometer in an upright position. 3. Breathe out normally. 4. Place the mouthpiece in your mouth and seal your lips tightly around it. 5. Breathe in slowly and as deeply as possible, raising the piston or the ball toward the top of the column. 6. Hold your breath for 3-5 seconds or for as long as possible. Allow the piston or ball to fall to the bottom of the column. 7. Remove the mouthpiece from your mouth and breathe out normally. 8. Rest for a few seconds and repeat Steps 1 through 7 at least 10 times every 1-2 hours when you are awake. Take your time and take a few normal breaths between deep breaths. 9. The spirometer may include an indicator to show your best effort. Use the indicator as a goal to work toward during each repetition. 10. After each set of 10 deep breaths, practice coughing to be sure your  lungs are clear. If you have an incision (the cut made at the time of surgery), support your incision when coughing by placing a pillow or rolled up towels firmly against it. Once you are able to get out of bed, walk around indoors and cough well. You may stop using the incentive spirometer when instructed by your caregiver.  RISKS AND COMPLICATIONS  Take your time so you do not get dizzy or light-headed.  If you are in pain, you may need to take or ask for pain medication before doing incentive spirometry. It is harder to take a deep breath if you are having pain. AFTER USE  Rest and breathe slowly and  easily.  It can be helpful to keep track of a log of your progress. Your caregiver can provide you with a simple table to help with this. If you are using the spirometer at home, follow these instructions: [place] IF:   You are having difficultly using the spirometer.  You have trouble using the spirometer as often as instructed.  Your pain medication is not giving enough relief while using the spirometer.  You develop fever of 100.5 F (38.1 C) or higher. SEEK IMMEDIATE MEDICAL CARE IF:   You cough up bloody sputum that had not been present before.  You develop fever of 102 F (38.9 C) or greater.  You develop worsening pain at or near the incision site. MAKE SURE YOU:   Understand these instructions.  Will watch your condition.  Will get help right away if you are not doing well or get worse. Document Released: 04/27/2007 Document Revised: 03/08/2012 Document Reviewed: 06/28/2007 [facility] Patient Information 2014 [place], [place].   ________________________________________________________________________

## 2017-09-28 ENCOUNTER — Encounter (HOSPITAL_COMMUNITY)
Admission: RE | Admit: 2017-09-28 | Discharge: 2017-09-28 | Disposition: A | Discharge status: Home / Self Care | Payer: Medicare HMO | Source: Ambulatory Visit | Attending: Orthopedic Surgery | Admitting: Orthopedic Surgery

## 2017-09-28 ENCOUNTER — Encounter (HOSPITAL_COMMUNITY): Payer: Self-pay

## 2017-09-28 DIAGNOSIS — Z87891 Personal history of nicotine dependence: Secondary | ICD-10-CM | POA: Insufficient documentation

## 2017-09-28 DIAGNOSIS — Z7982 Long term (current) use of aspirin: Secondary | ICD-10-CM | POA: Insufficient documentation

## 2017-09-28 DIAGNOSIS — Z79899 Other long term (current) drug therapy: Secondary | ICD-10-CM | POA: Diagnosis not present

## 2017-09-28 DIAGNOSIS — Z0183 Encounter for blood typing: Secondary | ICD-10-CM | POA: Insufficient documentation

## 2017-09-28 DIAGNOSIS — E039 Hypothyroidism, unspecified: Secondary | ICD-10-CM | POA: Diagnosis not present

## 2017-09-28 DIAGNOSIS — M1712 Unilateral primary osteoarthritis, left knee: Secondary | ICD-10-CM | POA: Diagnosis not present

## 2017-09-28 DIAGNOSIS — E785 Hyperlipidemia, unspecified: Secondary | ICD-10-CM | POA: Diagnosis not present

## 2017-09-28 DIAGNOSIS — I10 Essential (primary) hypertension: Secondary | ICD-10-CM | POA: Diagnosis not present

## 2017-09-28 DIAGNOSIS — K219 Gastro-esophageal reflux disease without esophagitis: Secondary | ICD-10-CM | POA: Insufficient documentation

## 2017-09-28 DIAGNOSIS — Z882 Allergy status to sulfonamides status: Secondary | ICD-10-CM | POA: Diagnosis not present

## 2017-09-28 DIAGNOSIS — Z01812 Encounter for preprocedural laboratory examination: Secondary | ICD-10-CM | POA: Diagnosis not present

## 2017-09-28 DIAGNOSIS — Z96651 Presence of right artificial knee joint: Secondary | ICD-10-CM | POA: Diagnosis not present

## 2017-09-28 HISTORY — DX: Pneumonia, unspecified organism: J18.9

## 2017-09-28 LAB — CBC
HCT: 43.5 % (ref 39.0–52.0)
Hemoglobin: 15.1 g/dL (ref 13.0–17.0)
MCH: 33.5 pg (ref 26.0–34.0)
MCHC: 34.7 g/dL (ref 30.0–36.0)
MCV: 96.5 fL (ref 78.0–100.0)
PLATELETS: 246 10*3/uL (ref 150–400)
RBC: 4.51 MIL/uL (ref 4.22–5.81)
RDW: 14.3 % (ref 11.5–15.5)
WBC: 8.9 10*3/uL (ref 4.0–10.5)

## 2017-09-28 LAB — BASIC METABOLIC PANEL
ANION GAP: 9 (ref 5–15)
BUN: 17 mg/dL (ref 6–20)
CALCIUM: 9.1 mg/dL (ref 8.9–10.3)
CO2: 27 mmol/L (ref 22–32)
CREATININE: 0.71 mg/dL (ref 0.61–1.24)
Chloride: 102 mmol/L (ref 101–111)
Glucose, Bld: 105 mg/dL — ABNORMAL HIGH (ref 65–99)
Potassium: 3.6 mmol/L (ref 3.5–5.1)
SODIUM: 138 mmol/L (ref 135–145)

## 2017-09-28 NOTE — Progress Notes (Signed)
FYI I am so sorry I accidentally released both sets of orders at preop apt. 10/1 . Please place orders for 11/23/17 surgery for Left total Knee arthroplasty . Sorry! Thank you

## 2017-10-05 ENCOUNTER — Observation Stay (HOSPITAL_COMMUNITY): Payer: Medicare Other

## 2017-10-05 ENCOUNTER — Encounter (HOSPITAL_COMMUNITY): Payer: Self-pay | Admitting: *Deleted

## 2017-10-05 ENCOUNTER — Inpatient Hospital Stay (HOSPITAL_COMMUNITY): Payer: Medicare Other

## 2017-10-05 ENCOUNTER — Inpatient Hospital Stay (HOSPITAL_COMMUNITY)
Admission: AD | Admit: 2017-10-05 | Discharge: 2017-10-06 | DRG: 499 | Disposition: A | Discharge status: Home / Self Care | Payer: Medicare Other | Source: Ambulatory Visit | Attending: Orthopedic Surgery | Admitting: Orthopedic Surgery

## 2017-10-05 ENCOUNTER — Ambulatory Visit (HOSPITAL_COMMUNITY): Payer: Medicare Other | Admitting: Certified Registered Nurse Anesthetist

## 2017-10-05 ENCOUNTER — Encounter (HOSPITAL_COMMUNITY)
Admission: AD | Disposition: A | Discharge status: Home / Self Care | Payer: Self-pay | Source: Ambulatory Visit | Attending: Orthopedic Surgery

## 2017-10-05 DIAGNOSIS — Z9889 Other specified postprocedural states: Secondary | ICD-10-CM

## 2017-10-05 DIAGNOSIS — M1712 Unilateral primary osteoarthritis, left knee: Secondary | ICD-10-CM | POA: Diagnosis present

## 2017-10-05 DIAGNOSIS — Z7982 Long term (current) use of aspirin: Secondary | ICD-10-CM

## 2017-10-05 DIAGNOSIS — Z96649 Presence of unspecified artificial hip joint: Secondary | ICD-10-CM

## 2017-10-05 DIAGNOSIS — E7800 Pure hypercholesterolemia, unspecified: Secondary | ICD-10-CM | POA: Diagnosis not present

## 2017-10-05 DIAGNOSIS — K5790 Diverticulosis of intestine, part unspecified, without perforation or abscess without bleeding: Secondary | ICD-10-CM | POA: Diagnosis present

## 2017-10-05 DIAGNOSIS — T84498A Other mechanical complication of other internal orthopedic devices, implants and grafts, initial encounter: Secondary | ICD-10-CM | POA: Diagnosis not present

## 2017-10-05 DIAGNOSIS — Z472 Encounter for removal of internal fixation device: Secondary | ICD-10-CM | POA: Diagnosis not present

## 2017-10-05 DIAGNOSIS — I4891 Unspecified atrial fibrillation: Secondary | ICD-10-CM

## 2017-10-05 DIAGNOSIS — Z7901 Long term (current) use of anticoagulants: Secondary | ICD-10-CM | POA: Diagnosis not present

## 2017-10-05 DIAGNOSIS — E039 Hypothyroidism, unspecified: Secondary | ICD-10-CM | POA: Diagnosis not present

## 2017-10-05 DIAGNOSIS — K219 Gastro-esophageal reflux disease without esophagitis: Secondary | ICD-10-CM | POA: Diagnosis present

## 2017-10-05 DIAGNOSIS — Z87891 Personal history of nicotine dependence: Secondary | ICD-10-CM

## 2017-10-05 DIAGNOSIS — Z79899 Other long term (current) drug therapy: Secondary | ICD-10-CM

## 2017-10-05 DIAGNOSIS — I480 Paroxysmal atrial fibrillation: Secondary | ICD-10-CM | POA: Diagnosis not present

## 2017-10-05 DIAGNOSIS — Z969 Presence of functional implant, unspecified: Secondary | ICD-10-CM | POA: Diagnosis not present

## 2017-10-05 DIAGNOSIS — R29810 Facial weakness: Secondary | ICD-10-CM | POA: Diagnosis present

## 2017-10-05 DIAGNOSIS — D649 Anemia, unspecified: Secondary | ICD-10-CM | POA: Diagnosis not present

## 2017-10-05 DIAGNOSIS — I10 Essential (primary) hypertension: Secondary | ICD-10-CM | POA: Diagnosis not present

## 2017-10-05 DIAGNOSIS — Z882 Allergy status to sulfonamides status: Secondary | ICD-10-CM | POA: Diagnosis not present

## 2017-10-05 DIAGNOSIS — E785 Hyperlipidemia, unspecified: Secondary | ICD-10-CM | POA: Diagnosis not present

## 2017-10-05 DIAGNOSIS — G479 Sleep disorder, unspecified: Secondary | ICD-10-CM | POA: Diagnosis not present

## 2017-10-05 DIAGNOSIS — R102 Pelvic and perineal pain: Secondary | ICD-10-CM | POA: Diagnosis not present

## 2017-10-05 DIAGNOSIS — Z96651 Presence of right artificial knee joint: Secondary | ICD-10-CM | POA: Diagnosis present

## 2017-10-05 HISTORY — DX: Unspecified atrial fibrillation: I48.91

## 2017-10-05 HISTORY — PX: HARDWARE REMOVAL: SHX979

## 2017-10-05 LAB — TYPE AND SCREEN
ABO/RH(D): O POS
ANTIBODY SCREEN: NEGATIVE

## 2017-10-05 LAB — ECHOCARDIOGRAM COMPLETE
Height: 71 in
Weight: 3472 oz

## 2017-10-05 SURGERY — REMOVAL, HARDWARE
Anesthesia: Spinal | Laterality: Left

## 2017-10-05 MED ORDER — OXYCODONE HCL 5 MG/5ML PO SOLN: 5.0000 mg | Freq: Once | ORAL | Status: DC | PRN

## 2017-10-05 MED ORDER — METHOCARBAMOL 500 MG PO TABS: 500.0000 mg | ORAL_TABLET | Freq: Four times a day (QID) | ORAL | 0 refills | Status: DC | PRN

## 2017-10-05 MED ORDER — CEFAZOLIN SODIUM-DEXTROSE 2-4 GM/100ML-% IV SOLN
INTRAVENOUS | Status: AC
  Filled 2017-10-05: qty 100

## 2017-10-05 MED ORDER — DEXAMETHASONE SODIUM PHOSPHATE 10 MG/ML IJ SOLN
10.0000 mg | Freq: Once | INTRAMUSCULAR | Status: AC
  Administered 2017-10-06: 10 mg via INTRAVENOUS
  Filled 2017-10-05: qty 1

## 2017-10-05 MED ORDER — MIDAZOLAM HCL 5 MG/5ML IJ SOLN
INTRAMUSCULAR | Status: DC | PRN
  Administered 2017-10-05 (×2): 1 mg via INTRAVENOUS

## 2017-10-05 MED ORDER — DEXAMETHASONE SODIUM PHOSPHATE 10 MG/ML IJ SOLN
INTRAMUSCULAR | Status: AC
  Filled 2017-10-05: qty 1

## 2017-10-05 MED ORDER — METOCLOPRAMIDE HCL 5 MG/ML IJ SOLN: 5.0000 mg | Freq: Three times a day (TID) | INTRAMUSCULAR | Status: DC | PRN

## 2017-10-05 MED ORDER — PANTOPRAZOLE SODIUM 40 MG PO TBEC
40.0000 mg | DELAYED_RELEASE_TABLET | Freq: Every day | ORAL | Status: DC
  Administered 2017-10-06: 40 mg via ORAL
  Filled 2017-10-05: qty 1

## 2017-10-05 MED ORDER — MENTHOL 3 MG MT LOZG: 1.0000 | LOZENGE | OROMUCOSAL | Status: DC | PRN

## 2017-10-05 MED ORDER — ASPIRIN 81 MG PO CHEW
81.0000 mg | CHEWABLE_TABLET | Freq: Two times a day (BID) | ORAL | Status: DC
  Administered 2017-10-05 – 2017-10-06 (×2): 81 mg via ORAL
  Filled 2017-10-05 (×2): qty 1

## 2017-10-05 MED ORDER — FENTANYL CITRATE (PF) 100 MCG/2ML IJ SOLN
INTRAMUSCULAR | Status: DC | PRN
  Administered 2017-10-05 (×2): 25 ug via INTRAVENOUS

## 2017-10-05 MED ORDER — VENLAFAXINE HCL ER 75 MG PO CP24
75.0000 mg | ORAL_CAPSULE | Freq: Every day | ORAL | Status: DC
Start: 2017-10-06 — End: 2017-10-06
  Administered 2017-10-06: 75 mg via ORAL
  Filled 2017-10-05: qty 1

## 2017-10-05 MED ORDER — CEFAZOLIN SODIUM-DEXTROSE 2-4 GM/100ML-% IV SOLN
2.0000 g | Freq: Four times a day (QID) | INTRAVENOUS | Status: AC
  Administered 2017-10-05 (×2): 2 g via INTRAVENOUS
  Filled 2017-10-05 (×2): qty 100

## 2017-10-05 MED ORDER — PHENYLEPHRINE 40 MCG/ML (10ML) SYRINGE FOR IV PUSH (FOR BLOOD PRESSURE SUPPORT)
PREFILLED_SYRINGE | INTRAVENOUS | Status: AC
  Filled 2017-10-05: qty 10

## 2017-10-05 MED ORDER — BISACODYL 10 MG RE SUPP
10.0000 mg | Freq: Every day | RECTAL | Status: DC | PRN
Start: 2017-10-05 — End: 2017-10-06

## 2017-10-05 MED ORDER — METHOCARBAMOL 500 MG PO TABS
500.0000 mg | ORAL_TABLET | Freq: Four times a day (QID) | ORAL | Status: DC | PRN
Start: 2017-10-05 — End: 2017-10-06

## 2017-10-05 MED ORDER — PROPOFOL 10 MG/ML IV BOLUS
INTRAVENOUS | Status: AC
  Filled 2017-10-05: qty 60

## 2017-10-05 MED ORDER — DOCUSATE SODIUM 100 MG PO CAPS
100.0000 mg | ORAL_CAPSULE | Freq: Two times a day (BID) | ORAL | Status: DC
  Administered 2017-10-05 – 2017-10-06 (×2): 100 mg via ORAL
  Filled 2017-10-05 (×2): qty 1

## 2017-10-05 MED ORDER — ONDANSETRON HCL 4 MG/2ML IJ SOLN: 4.0000 mg | Freq: Four times a day (QID) | INTRAMUSCULAR | Status: DC | PRN

## 2017-10-05 MED ORDER — AMLODIPINE BESYLATE 5 MG PO TABS: 5.0000 mg | ORAL_TABLET | Freq: Every day | ORAL | Status: DC

## 2017-10-05 MED ORDER — FERROUS SULFATE 325 (65 FE) MG PO TABS: 325.0000 mg | ORAL_TABLET | Freq: Three times a day (TID) | ORAL | 3 refills | Status: DC

## 2017-10-05 MED ORDER — PROPOFOL 500 MG/50ML IV EMUL
INTRAVENOUS | Status: DC | PRN
  Administered 2017-10-05: 100 ug/kg/min via INTRAVENOUS

## 2017-10-05 MED ORDER — OXYCODONE HCL 5 MG PO TABS: 5.0000 mg | ORAL_TABLET | Freq: Once | ORAL | Status: DC | PRN

## 2017-10-05 MED ORDER — LEVOTHYROXINE SODIUM 50 MCG PO TABS
150.0000 ug | ORAL_TABLET | Freq: Every day | ORAL | Status: DC
  Administered 2017-10-06: 08:00:00 150 ug via ORAL
  Filled 2017-10-05: qty 1

## 2017-10-05 MED ORDER — DOCUSATE SODIUM 100 MG PO CAPS: 100.0000 mg | ORAL_CAPSULE | Freq: Two times a day (BID) | ORAL | 0 refills | Status: DC

## 2017-10-05 MED ORDER — MAGNESIUM CITRATE PO SOLN: 1.0000 | Freq: Once | ORAL | Status: DC | PRN

## 2017-10-05 MED ORDER — METHOCARBAMOL 1000 MG/10ML IJ SOLN
500.0000 mg | Freq: Four times a day (QID) | INTRAMUSCULAR | Status: DC | PRN
  Filled 2017-10-05: qty 5

## 2017-10-05 MED ORDER — IRBESARTAN 150 MG PO TABS
150.0000 mg | ORAL_TABLET | Freq: Every day | ORAL | Status: DC
  Administered 2017-10-06: 150 mg via ORAL
  Filled 2017-10-05: qty 1

## 2017-10-05 MED ORDER — DIPHENHYDRAMINE HCL 25 MG PO CAPS: 25.0000 mg | ORAL_CAPSULE | Freq: Four times a day (QID) | ORAL | Status: DC | PRN

## 2017-10-05 MED ORDER — POLYETHYLENE GLYCOL 3350 17 G PO PACK: 17.0000 g | PACK | Freq: Two times a day (BID) | ORAL | 0 refills | Status: DC

## 2017-10-05 MED ORDER — HYDROCODONE-ACETAMINOPHEN 7.5-325 MG PO TABS
1.0000 | ORAL_TABLET | ORAL | Status: DC
  Administered 2017-10-05 (×3): 1 via ORAL
  Administered 2017-10-06 (×2): 2 via ORAL
  Filled 2017-10-05: qty 1
  Filled 2017-10-05: qty 2
  Filled 2017-10-05 (×5): qty 1

## 2017-10-05 MED ORDER — SODIUM CHLORIDE 0.9 % IV SOLN
1000.0000 mg | INTRAVENOUS | Status: AC
  Administered 2017-10-05: 1000 mg via INTRAVENOUS
  Filled 2017-10-05: qty 1100

## 2017-10-05 MED ORDER — HYDROMORPHONE HCL-NACL 0.5-0.9 MG/ML-% IV SOSY: 0.2500 mg | PREFILLED_SYRINGE | INTRAVENOUS | Status: DC | PRN

## 2017-10-05 MED ORDER — ONDANSETRON HCL 4 MG/2ML IJ SOLN
INTRAMUSCULAR | Status: AC
  Filled 2017-10-05: qty 2

## 2017-10-05 MED ORDER — ATORVASTATIN CALCIUM 20 MG PO TABS
20.0000 mg | ORAL_TABLET | Freq: Every day | ORAL | Status: DC
  Administered 2017-10-06: 20 mg via ORAL
  Filled 2017-10-05 (×2): qty 1

## 2017-10-05 MED ORDER — PHENYLEPHRINE HCL 10 MG/ML IJ SOLN
INTRAMUSCULAR | Status: AC
  Filled 2017-10-05: qty 1

## 2017-10-05 MED ORDER — PHENOL 1.4 % MT LIQD: 1.0000 | OROMUCOSAL | Status: DC | PRN

## 2017-10-05 MED ORDER — ONDANSETRON HCL 4 MG/2ML IJ SOLN
INTRAMUSCULAR | Status: DC | PRN
  Administered 2017-10-05: 4 mg via INTRAVENOUS

## 2017-10-05 MED ORDER — HYDROCODONE-ACETAMINOPHEN 7.5-325 MG PO TABS: 1.0000 | ORAL_TABLET | ORAL | 0 refills | Status: DC | PRN

## 2017-10-05 MED ORDER — TRANEXAMIC ACID 1000 MG/10ML IV SOLN
1000.0000 mg | Freq: Once | INTRAVENOUS | Status: AC
  Administered 2017-10-05: 1000 mg via INTRAVENOUS
  Filled 2017-10-05: qty 1100

## 2017-10-05 MED ORDER — CEFAZOLIN SODIUM-DEXTROSE 2-4 GM/100ML-% IV SOLN
2.0000 g | INTRAVENOUS | Status: AC
  Administered 2017-10-05: 2 g via INTRAVENOUS

## 2017-10-05 MED ORDER — FENTANYL CITRATE (PF) 100 MCG/2ML IJ SOLN: 25.0000 ug | INTRAMUSCULAR | Status: DC | PRN

## 2017-10-05 MED ORDER — METOCLOPRAMIDE HCL 5 MG PO TABS: 5.0000 mg | ORAL_TABLET | Freq: Three times a day (TID) | ORAL | Status: DC | PRN

## 2017-10-05 MED ORDER — POLYETHYLENE GLYCOL 3350 17 G PO PACK
17.0000 g | PACK | Freq: Two times a day (BID) | ORAL | Status: DC
  Administered 2017-10-05 – 2017-10-06 (×2): 17 g via ORAL
  Filled 2017-10-05 (×2): qty 1

## 2017-10-05 MED ORDER — ALUM & MAG HYDROXIDE-SIMETH 200-200-20 MG/5ML PO SUSP: 15.0000 mL | ORAL | Status: DC | PRN

## 2017-10-05 MED ORDER — ONDANSETRON HCL 4 MG PO TABS: 4.0000 mg | ORAL_TABLET | Freq: Four times a day (QID) | ORAL | Status: DC | PRN

## 2017-10-05 MED ORDER — FERROUS SULFATE 325 (65 FE) MG PO TABS
325.0000 mg | ORAL_TABLET | Freq: Three times a day (TID) | ORAL | Status: DC
  Administered 2017-10-06 (×2): 325 mg via ORAL
  Filled 2017-10-05 (×2): qty 1

## 2017-10-05 MED ORDER — PROPOFOL 10 MG/ML IV BOLUS
INTRAVENOUS | Status: DC | PRN
  Administered 2017-10-05: 20 mg via INTRAVENOUS

## 2017-10-05 MED ORDER — CHLORHEXIDINE GLUCONATE 4 % EX LIQD: 60.0000 mL | Freq: Once | CUTANEOUS | Status: DC

## 2017-10-05 MED ORDER — PHENYLEPHRINE HCL 10 MG/ML IJ SOLN
INTRAMUSCULAR | Status: DC | PRN
  Administered 2017-10-05: 120 ug via INTRAVENOUS
  Administered 2017-10-05: 80 ug via INTRAVENOUS
  Administered 2017-10-05: 120 ug via INTRAVENOUS

## 2017-10-05 MED ORDER — GLYCOPYRROLATE 0.2 MG/ML IV SOSY
PREFILLED_SYRINGE | INTRAVENOUS | Status: AC
  Filled 2017-10-05: qty 3

## 2017-10-05 MED ORDER — 0.9 % SODIUM CHLORIDE (POUR BTL) OPTIME
TOPICAL | Status: DC | PRN
  Administered 2017-10-05: 1000 mL

## 2017-10-05 MED ORDER — MIDAZOLAM HCL 2 MG/2ML IJ SOLN
INTRAMUSCULAR | Status: AC
  Filled 2017-10-05: qty 2

## 2017-10-05 MED ORDER — ASPIRIN 81 MG PO CHEW: 81.0000 mg | CHEWABLE_TABLET | Freq: Two times a day (BID) | ORAL | 0 refills | Status: DC

## 2017-10-05 MED ORDER — PHENYLEPHRINE HCL 10 MG/ML IJ SOLN
INTRAVENOUS | Status: DC | PRN
  Administered 2017-10-05: 50 ug/min via INTRAVENOUS

## 2017-10-05 MED ORDER — DEXAMETHASONE SODIUM PHOSPHATE 10 MG/ML IJ SOLN
10.0000 mg | Freq: Once | INTRAMUSCULAR | Status: AC
  Administered 2017-10-05: 10 mg via INTRAVENOUS

## 2017-10-05 MED ORDER — GLYCOPYRROLATE 0.2 MG/ML IV SOSY
PREFILLED_SYRINGE | INTRAVENOUS | Status: DC | PRN
  Administered 2017-10-05: .2 mg via INTRAVENOUS

## 2017-10-05 MED ORDER — FENTANYL CITRATE (PF) 100 MCG/2ML IJ SOLN
INTRAMUSCULAR | Status: AC
  Filled 2017-10-05: qty 2

## 2017-10-05 MED ORDER — HYDROMORPHONE HCL-NACL 0.5-0.9 MG/ML-% IV SOSY: 0.5000 mg | PREFILLED_SYRINGE | INTRAVENOUS | Status: DC | PRN

## 2017-10-05 MED ORDER — SODIUM CHLORIDE 0.9 % IV SOLN
INTRAVENOUS | Status: DC
  Administered 2017-10-05: 12:00:00 via INTRAVENOUS

## 2017-10-05 MED ORDER — VALSARTAN-HYDROCHLOROTHIAZIDE 160-25 MG PO TABS: 1.0000 | ORAL_TABLET | Freq: Every day | ORAL | Status: DC

## 2017-10-05 MED ORDER — METOPROLOL TARTRATE 25 MG PO TABS
25.0000 mg | ORAL_TABLET | Freq: Two times a day (BID) | ORAL | Status: DC
  Administered 2017-10-05 – 2017-10-06 (×3): 25 mg via ORAL
  Filled 2017-10-05 (×3): qty 1

## 2017-10-05 MED ORDER — HYDROCHLOROTHIAZIDE 25 MG PO TABS
25.0000 mg | ORAL_TABLET | Freq: Every day | ORAL | Status: DC
  Administered 2017-10-06: 25 mg via ORAL
  Filled 2017-10-05: qty 1

## 2017-10-05 MED ORDER — BUPIVACAINE IN DEXTROSE 0.75-8.25 % IT SOLN
INTRATHECAL | Status: DC | PRN
  Administered 2017-10-05: 1.8 mL via INTRATHECAL

## 2017-10-05 MED ORDER — LACTATED RINGERS IV SOLN
INTRAVENOUS | Status: DC
  Administered 2017-10-05 (×2): via INTRAVENOUS

## 2017-10-05 SURGICAL SUPPLY — 53 items
BAG ZIPLOCK 12X15 (MISCELLANEOUS) IMPLANT
BANDAGE ACE 6X5 VEL STRL LF (GAUZE/BANDAGES/DRESSINGS) IMPLANT
BANDAGE ESMARK 6X9 LF (GAUZE/BANDAGES/DRESSINGS) IMPLANT
BLADE SAW SGTL 11.0X1.19X90.0M (BLADE) IMPLANT
BNDG ESMARK 6X9 LF (GAUZE/BANDAGES/DRESSINGS)
CLOSURE WOUND 1/2 X4 (GAUZE/BANDAGES/DRESSINGS)
COVER BACK TABLE 60X90IN (DRAPES) ×3 IMPLANT
COVER MAYO STAND STRL (DRAPES) IMPLANT
COVER SURGICAL LIGHT HANDLE (MISCELLANEOUS) ×3 IMPLANT
CUFF TOURN SGL QUICK 34 (TOURNIQUET CUFF)
CUFF TRNQT CYL 34X4X40X1 (TOURNIQUET CUFF) IMPLANT
DERMABOND ADVANCED (GAUZE/BANDAGES/DRESSINGS) ×2
DERMABOND ADVANCED .7 DNX12 (GAUZE/BANDAGES/DRESSINGS) ×1 IMPLANT
DRAPE C-ARM 42X120 X-RAY (DRAPES) IMPLANT
DRAPE EXTREMITY T 121X128X90 (DRAPE) IMPLANT
DRAPE OEC MINIVIEW 54X84 (DRAPES) IMPLANT
DRAPE ORTHO SPLIT 77X108 STRL (DRAPES) ×4
DRAPE POUCH INSTRU U-SHP 10X18 (DRAPES) IMPLANT
DRAPE STERI IOBAN 125X83 (DRAPES) ×6 IMPLANT
DRAPE SURG ORHT 6 SPLT 77X108 (DRAPES) ×2 IMPLANT
DRAPE U-SHAPE 47X51 STRL (DRAPES) IMPLANT
DRSG AQUACEL AG ADV 3.5X 4 (GAUZE/BANDAGES/DRESSINGS) ×3 IMPLANT
DRSG AQUACEL AG ADV 3.5X14 (GAUZE/BANDAGES/DRESSINGS) ×3 IMPLANT
DRSG EMULSION OIL 3X16 NADH (GAUZE/BANDAGES/DRESSINGS) IMPLANT
DRSG PAD ABDOMINAL 8X10 ST (GAUZE/BANDAGES/DRESSINGS) IMPLANT
DURAPREP 26ML APPLICATOR (WOUND CARE) ×3 IMPLANT
ELECT REM PT RETURN 15FT ADLT (MISCELLANEOUS) ×3 IMPLANT
GAUZE SPONGE 4X4 12PLY STRL (GAUZE/BANDAGES/DRESSINGS) IMPLANT
GLOVE BIOGEL M 7.0 STRL (GLOVE) IMPLANT
GLOVE BIOGEL PI IND STRL 7.5 (GLOVE) ×1 IMPLANT
GLOVE BIOGEL PI IND STRL 8.5 (GLOVE) ×1 IMPLANT
GLOVE BIOGEL PI INDICATOR 7.5 (GLOVE) ×2
GLOVE BIOGEL PI INDICATOR 8.5 (GLOVE) ×2
GLOVE ECLIPSE 8.0 STRL XLNG CF (GLOVE) ×3 IMPLANT
GLOVE ORTHO TXT STRL SZ7.5 (GLOVE) ×6 IMPLANT
GLOVE SURG ORTHO 8.0 STRL STRW (GLOVE) ×3 IMPLANT
GOWN STRL REUS W/TWL LRG LVL3 (GOWN DISPOSABLE) IMPLANT
GOWN STRL REUS W/TWL XL LVL3 (GOWN DISPOSABLE) ×6 IMPLANT
GUIDEPIN 3.2X17.5 THRD DISP (PIN) ×3 IMPLANT
KIT BASIN OR (CUSTOM PROCEDURE TRAY) ×3 IMPLANT
MANIFOLD NEPTUNE II (INSTRUMENTS) ×3 IMPLANT
NS IRRIG 1000ML POUR BTL (IV SOLUTION) IMPLANT
PACK TOTAL JOINT (CUSTOM PROCEDURE TRAY) ×3 IMPLANT
PADDING CAST COTTON 6X4 STRL (CAST SUPPLIES) IMPLANT
POSITIONER SURGICAL ARM (MISCELLANEOUS) ×3 IMPLANT
STAPLER VISISTAT 35W (STAPLE) IMPLANT
STRIP CLOSURE SKIN 1/2X4 (GAUZE/BANDAGES/DRESSINGS) IMPLANT
SUT MNCRL AB 4-0 PS2 18 (SUTURE) ×3 IMPLANT
SUT VIC AB 1 CT1 36 (SUTURE) ×6 IMPLANT
SUT VIC AB 2-0 CT1 27 (SUTURE) ×4
SUT VIC AB 2-0 CT1 TAPERPNT 27 (SUTURE) ×2 IMPLANT
TOWEL OR 17X26 10 PK STRL BLUE (TOWEL DISPOSABLE) ×6 IMPLANT
WATER STERILE IRR 1500ML POUR (IV SOLUTION) IMPLANT

## 2017-10-05 NOTE — Interval H&P Note (Signed)
History and Physical Interval Note:  10/05/2017 7:11 AM  Eugene White  has presented today for surgery, with the diagnosis of Status post left femoral nailing  The various methods of treatment have been discussed with the patient and family. After consideration of risks, benefits and other options for treatment, the patient has consented to  Procedure(s) with comments: Removal of left femoral nail (Left) - 90 mins as a surgical intervention .  The patient's history has been reviewed, patient examined, no change in status, stable for surgery.  I have reviewed the patient's chart and labs.  Questions were answered to the patient's satisfaction.     [name]

## 2017-10-05 NOTE — Progress Notes (Signed)
  Echocardiogram 2D Echocardiogram has been performed.  Reid Nawrot L Androw 10/05/2017, 2:05 PM

## 2017-10-05 NOTE — Transfer of Care (Signed)
Immediate Anesthesia Transfer of Care Note  Patient: MEAGAN SPEASE  Procedure(s) Performed: Procedure(s) with comments: Removal of left femoral nail (Left) - 90 mins  Patient Location: PACU  Anesthesia Type:MAC and Spinal  Level of Consciousness: Patient easily awoken, sedated, comfortable, cooperative, following commands, responds to stimulation.   Airway & Oxygen Therapy: Patient spontaneously breathing, ventilating well, oxygen via simple oxygen mask.  Post-op Assessment: Report given to PACU RN, vital signs reviewed and stable, moving all extremities.   Post vital signs: Reviewed and stable.  Complications: No apparent anesthesia complications  Last Vitals:  Vitals:   10/05/17 0544  BP: (!) 143/103  Pulse: 89  Resp: 16  Temp: 36.9 C  SpO2: 99%    Last Pain:  Vitals:   10/05/17 0544  TempSrc: Oral  PainSc: 2          Complications: No apparent anesthesia complications

## 2017-10-05 NOTE — Progress Notes (Signed)
Patient complained that he felt a sharp pop in the incision just above his L knee. Then he noticed a knot under the dressing. RN lifted the edge to ensure there was no bleeding. The incision was intact and no signs of bleeding. On call PA was informed and ordered to apply a pressure dressing with an ACE wrap and to monitor overnight. ACE wrap applied to patient's tolerance level.

## 2017-10-05 NOTE — Consult Note (Signed)
Cardiology Consultation:   Patient ID: [name]; [id number]; 03-13-1946   Admit date: 10/05/2017 Date of Consult: 10/05/2017  Primary Care Provider: [name], PA Primary Cardiologist: Reola Calkins, Dr [name] Primary Electrophysiologist:  n/a   Patient Profile:   Eugene White is a 71 y.o. 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  History of Present Illness:   Mr. Bolender has been in his USOH. He came in today for removal of a L femoral nail. He fell and broke his hip 09/2016 and had the nail inserted. It was causing significant pain, mostly in his L knee. He also has OA in his L knee and needs TKR.   During the surgery, the patient was noted to go into atrial fib, controlled rate. In PACU, he is still in atrial fib, HR 80s.  Mr Honeywell has not checked his BP recently, but states his BP is normally good, HR 70s.   He does not feel any different now. He is not aware of the irregular HR.   He has not had palpitations, chest pain, DOE, orthopnea, PND, presyncope or syncope.   He had a physical a few weeks ago, no ECG done but heart sounds were normal.   If he has had this before, he was not aware of it.    Past Medical History:  Diagnosis Date  . Anemia    hx of one time  . Arthritis    OA  . Atrial fibrillation with controlled ventricular rate ([place]) 10/05/2017  . Difficulty sleeping   . Diverticulosis 2014  . 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 GERD (gastroesophageal reflux disease)   . GI symptom    had nausea / vomited once / frequent stools / getting better  . Hyperlipidemia   . Hypertension   . Mood changes   . Pneumonia    hx of  . Thyroid disease    hypo    Past Surgical History:  Procedure Laterality Date  . COLONOSCOPY  2014  . FEMUR IM NAIL Left 10/11/2016   Procedure:  INTRAMEDULLARY (IM) NAIL FEMORAL;  Surgeon: [name], MD;  Location: [facility] ORS;  Service: Orthopedics;  Laterality: Left;  . HAND SURGERY   1973 / 2010 / 2015   right to release tendons  . KNEE ARTHROSCOPY  1984 & 2001   twice  . NASAL SEPTUM SURGERY    . removal Left femoral nail      10/05/17 Dr. [name]  . [place] & 2006   Right and left  . TOTAL KNEE ARTHROPLASTY Right 01/09/2015   Procedure: RIGHT TOTAL KNEE ARTHROPLASTY;  Surgeon: [name], MD;  Location: [facility];  Service: Orthopedics;  Laterality: Right;     Inpatient Medications: Scheduled Meds: . chlorhexidine  60 mL Topical Once   Continuous Infusions: . sodium chloride    . lactated ringers 75 mL/hr at 10/05/17 0600  . methocarbamol (ROBAXIN)  IV     PRN Meds: HYDROmorphone (DILAUDID) injection, methocarbamol **OR** methocarbamol (ROBAXIN)  IV, ondansetron (ZOFRAN) IV, oxyCODONE **OR** oxyCODONE Medication Sig  amLODipine (NORVASC) 5 MG tablet TAKE 1 TABLET EVERY DAY Patient taking differently: TAKE 1 TABLET EVERY DAILY AT NOON.  aspirin EC 81 MG tablet Take 81 mg by mouth  daily at 12 noon.   atorvastatin (LIPITOR) 20 MG tablet TAKE 1 TABLET EVERY DAY Patient taking differently: TAKE 1 TABLET EVERY DAILY AT NOON.  Cholecalciferol (VITAMIN D) 2000 UNITS CAPS Take 2,000 Units by mouth daily at 12 noon.   ibuprofen (ADVIL,MOTRIN) 200 MG tablet Take 800 mg by mouth every 8 (eight) hours as needed (for pain.).  levothyroxine (SYNTHROID, LEVOTHROID) 150 MCG tablet TAKE 1 TABLET EVERY DAY Patient taking differently: TAKE 1 TABLET EVERY DAILY AT NOON.  meloxicam (MOBIC) 15 MG tablet TAKE 1 TABLET EVERY DAY AS NEEDED Patient taking differently: TAKE 1 TABLET EVERY DAILY AT NOON.  Multiple Vitamin (MULTIVITAMIN WITH MINERALS) TABS tablet Take 1 tablet by mouth daily at 12 noon.   Multiple Vitamins-Minerals (PRESERVISION AREDS 2 PO) Take 2 tablets by mouth daily at 12 noon.  pantoprazole  (PROTONIX) 40 MG tablet TAKE 1 TABLET EVERY DAY Patient taking differently: TAKE 1 TABLET EVERY DAILY AT NOON.  polycarbophil (FIBERCON) 625 MG tablet Take 1,250 mg by mouth daily at 12 noon.   valsartan-hydrochlorothiazide (DIOVAN-HCT) 160-25 MG tablet TAKE 1 TABLET EVERY DAY Patient taking differently: TAKE 1 TABLET EVERY DAILY AT NOON.  venlafaxine XR (EFFEXOR-XR) 75 MG 24 hr capsule TAKE 1 CAPSULE EVERY DAY Patient taking differently: TAKE 1 CAPSULE EVERY DAILY AT NOON.  aspirin (ASPIRIN CHILDRENS) 81 MG chewable tablet Chew 1 tablet (81 mg total) by mouth 2 (two) times daily.  docusate sodium (COLACE) 100 MG capsule Take 1 capsule (100 mg total) by mouth 2 (two) times daily.  ferrous sulfate (FERROUSUL) 325 (65 FE) MG tablet Take 1 tablet (325 mg total) by mouth 3 (three) times daily with meals.  HYDROcodone-acetaminophen (NORCO) 7.5-325 MG tablet Take 1-2 tablets by mouth every 4 (four) hours as needed for moderate pain or severe pain.  methocarbamol (ROBAXIN) 500 MG tablet Take 1 tablet (500 mg total) by mouth every 6 (six) hours as needed for muscle spasms.  polyethylene glycol (MIRALAX / GLYCOLAX) packet Take 17 g by mouth 2 (two) times daily.    Allergies:    Allergies  Allergen Reactions  . Sulfa Antibiotics Other (See Comments)    unknown    Social History:   Social History   Social History  . Marital status: Married    Spouse name: N/A  . Number of children: N/A  . Years of education: N/A   Occupational History  . retired, has degree in physiology    Social History Main Topics  . Smoking status: Former Smoker    Quit date: 12/29/1968  . Smokeless tobacco: Never Used  . Alcohol use 7.2 oz/week    12 Cans of beer per week     Comment: occasiona  . Drug use: No  . Sexual activity: Yes   Other Topics Concern  . Not on file   Social History Narrative   Pt lives in [place] w/ wife.    Family History:   The patient's family history includes Breast cancer in  his mother. There is no history of Colon cancer, Esophageal cancer, Stomach cancer, or Rectal cancer. Pt indicated that the status of his mother is unknown. He indicated that the status of his neg hx is unknown.    ROS:  Please see the history of present illness.  All other ROS reviewed and negative.      Physical Exam/Data:   Vitals:   10/05/17 0930 10/05/17 0945 10/05/17 1000 10/05/17 1015  BP: 107/86 119/89 (!) 117/94 (!) 125/99  Pulse: 77 74  76 79  Resp: 12 12 12 14   Temp:  (!) 96.8 F (36 C)    TempSrc:      SpO2: 100% 100% 100% 100%  Weight:      Height:        Intake/Output Summary (Last 24 hours) at 10/05/17 1035 Last data filed at 10/05/17 1001  Gross per 24 hour  Intake             1600 ml  Output              450 ml  Net             1150 ml   Filed Weights   10/05/17 0544  Weight: 217 lb (98.4 kg)   Body mass index is 30.27 kg/m.  General:  Well nourished, well developed, in no acute distress HEENT: normal for him, has had R facial droop since birth Lymph: no adenopathy Neck: no JVD Endocrine:  No thryomegaly Vascular: No carotid bruits; 4/4 extremity pulses 2+   Cardiac:  normal S1, S2; Irreg R&R; no murmur  Lungs:  clear to auscultation bilaterally, no wheezing, rhonchi or rales  Abd: soft, nontender, no hepatomegaly  Ext: no edema Musculoskeletal:  No deformities, BUE strength normal and equal; L-LE ok, R-LE s/p surgery Skin: warm and dry; incision dressed, no drainage Neuro:  CNs 2-12 intact, no focal abnormalities noted Psych:  Normal affect   EKG:  The EKG was personally reviewed and demonstrates:  Atrial fib, HR 87, low voltage from 09/2016 but the difference is of unclear significance Telemetry:  Telemetry was personally reviewed and demonstrates:  Atrial fib, controlled rate  Relevant CV Studies:  none  Laboratory Data:  Chemistry  Recent Labs Lab 09/28/17 1513  NA 138  K 3.6  CL 102  CO2 27  GLUCOSE 105*  BUN 17  CREATININE  0.71  CALCIUM 9.1  GFRNONAA >60  GFRAA >60  ANIONGAP 9    Lab Results  Component Value Date   ALT 29 10/27/2016   AST 21 10/27/2016   ALKPHOS 179 (H) 10/27/2016   BILITOT 0.5 10/27/2016    Hematology  Recent Labs Lab 09/28/17 1513  WBC 8.9  RBC 4.51  HGB 15.1  HCT 43.5  MCV 96.5  MCH 33.5  MCHC 34.7  RDW 14.3  PLT 246    Radiology/Studies:  Dg Pelvis Portable  Result Date: 10/05/2017 CLINICAL DATA:  Status post removal of hip hardware. EXAM: PORTABLE PELVIS 1-2 VIEWS COMPARISON:  10/10/2016 FINDINGS: There is soft tissue gas around the left hip. A screw tract is seen to the intertrochanteric femur and femoral neck which distorted from prior fracture. No acute fracture line is seen in this one view. Both hips are located. IMPRESSION: No acute finding after left femur hardware removal. Electronically Signed   By: Monte Fantasia M.D.   On: 10/05/2017 09:53    Assessment and Plan:   1.  Atrial fibrillation with controlled ventricular rate (HCC) - per reports, was in SR on arrival to OR, went into afib during - ECG done after surgery in PACU>>afib - not on rate-lowering rx pta - will ck echo - ck TSH but Synthroid dose has not changed recently - CHADS2VASC= 2 (HTN, age x 1) - briefly discussed anticoag w/ pt and his wife.  - ck echo - keep on telemetry for now.   Otherwise, per IM. Principal Problem:   Left femur IM nail removal Active Problems:  Signed, Barrett, Suanne Marker, PA-C  10/05/2017 10:35 AM  The patient was seen, examined and discussed with Rosaria Ferries, PA-C and I agree with the above.   71 y.o. 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 He was admitted today for removal of a L femoral nail. He fell and broke his hip 09/2016 and had the nail inserted. It was causing significant pain, mostly in his L knee. He also has  OA in his L knee and needs TKR.  During the surgery, the patient was noted to go into atrial fib, controlled rate. In PACU, he is still in atrial fib, HR 80s. Currently still in atrial fibrillation, in SR on ECG in 2017. The patient has no prior history of cardiac disease, he is unaware that he is in atrial fibrillation denies any chest pain or shortness of breath. There is no prior echocardiogram in Epic. Physical exam doesn't reveal any JVDs lungs are clear, no significant murmur no lower extremity edema.I would start low-dose beta blocker metoprolol 25 mg by mouth twice a day, CHADS2VASC= 2 (HTN, age x 1), he is currently on no anticoagulation, just postsurgery,who recommended oral anticoagulation tomorrow if he remains in A. Fib. We will decide on further management based on the results of echocardiogram.  Ena Dawley, MD 10/05/2017

## 2017-10-05 NOTE — Brief Op Note (Signed)
10/05/2017  8:40 AM  PATIENT:  Eugene White  71 y.o. male  PRE-OPERATIVE DIAGNOSIS:  STATUS POST LEFT FEMORAL NAILING, retained implant  POST-OPERATIVE DIAGNOSIS:  STATUS POST LEFT FEMORAL NAILING, retained implant  PROCEDURE:  Procedure(s) with comments: Removal of left femoral nail (Left) - 90 mins  SURGEON:  Surgeon(s) and Role:    [name], MD - Primary  PHYSICIAN ASSISTANT: Danae Orleans, PA-C  ANESTHESIA:   spinal  EBL:  Total I/O In: 1000 [I.V.:1000] Out: 150 [Urine:150]  BLOOD ADMINISTERED:none  DRAINS: none   LOCAL MEDICATIONS USED:  NONE  SPECIMEN:  No Specimen  DISPOSITION OF SPECIMEN:  N/A  COUNTS:  YES  TOURNIQUET:  * No tourniquets in log *  DICTATION: .Other Dictation: Dictation Number [id number]  PLAN OF CARE: Admit for overnight observation  PATIENT DISPOSITION:  PACU - hemodynamically stable.   Delay start of Pharmacological VTE agent (>24hrs) due to surgical blood loss or risk of bleeding: no

## 2017-10-05 NOTE — Anesthesia Postprocedure Evaluation (Signed)
Anesthesia Post Note  Patient: Eugene White  Procedure(s) Performed: Removal of left femoral nail (Left )     Patient location during evaluation: PACU Anesthesia Type: Spinal Level of consciousness: awake and alert Pain management: pain level controlled Vital Signs Assessment: post-procedure vital signs reviewed and stable Respiratory status: spontaneous breathing, nonlabored ventilation, respiratory function stable and patient connected to nasal cannula oxygen Cardiovascular status: blood pressure returned to baseline and stable Postop Assessment: no apparent nausea or vomiting Anesthetic complications: no Comments: Pt was noted to be in atrial fibrillation during surgery.  EKG in post-op confirmed.  Plan for cardiology consult as this is a new development for this patient.    Last Vitals:  Vitals:   10/05/17 1000 10/05/17 1015  BP: (!) 117/94 (!) 125/99  Pulse: 76 79  Resp: 12 14  Temp:    SpO2: 100% 100%    Last Pain:  Vitals:   10/05/17 1030  TempSrc:   PainSc: 2                  Sahej Hauswirth S

## 2017-10-05 NOTE — Anesthesia Procedure Notes (Signed)
Spinal  Patient location during procedure: OR Start time: 10/05/2017 7:25 AM End time: 10/05/2017 7:31 AM Staffing Anesthesiologist: Marcie Bal, Rashan Rounsaville Performed: anesthesiologist  Preanesthetic Checklist Completed: patient identified, surgical consent, pre-op evaluation, timeout performed, IV checked, risks and benefits discussed and monitors and equipment checked Spinal Block Patient position: sitting Prep: DuraPrep Patient monitoring: cardiac monitor, continuous pulse ox and blood pressure Approach: midline Location: L3-4 Injection technique: single-shot Needle Needle type: Pencan  Needle gauge: 24 G Needle length: 9 cm Assessment Sensory level: T10 Additional Notes Functioning IV was confirmed and monitors were applied. Sterile prep and drape, including hand hygiene and sterile gloves were used. The patient was positioned and the spine was prepped. The skin was anesthetized with lidocaine.  Free flow of clear CSF was obtained prior to injecting local anesthetic into the CSF.  The spinal needle aspirated freely following injection.  The needle was carefully withdrawn.  The patient tolerated the procedure well.

## 2017-10-05 NOTE — Anesthesia Procedure Notes (Signed)
Procedure Name: MAC Performed by: Deliah Boston Pre-anesthesia Checklist: Patient identified, Emergency Drugs available, Suction available, Patient being monitored and Timeout performed Patient Re-evaluated:Patient Re-evaluated prior to induction Oxygen Delivery Method: Simple face mask Preoxygenation: Pre-oxygenation with 100% oxygen Placement Confirmation: positive ETCO2

## 2017-10-05 NOTE — Anesthesia Preprocedure Evaluation (Signed)
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  Neuromuscular disease    GI/Hepatic GERD  ,  Endo/Other  Hypothyroidism   Renal/GU      Musculoskeletal  (+) Arthritis ,   Abdominal   Peds  Hematology   Anesthesia Other Findings   Reproductive/Obstetrics                             Anesthesia Physical Anesthesia Plan  ASA: II  Anesthesia Plan: Spinal   Post-op Pain Management:    Induction: Intravenous  PONV Risk Score and Plan: 1 and Ondansetron, Dexamethasone, Propofol infusion and Treatment may vary due to age or medical condition  Airway Management Planned: Simple Face Mask  Additional Equipment:   Intra-op Plan:   Post-operative Plan:   Informed Consent: I have reviewed the patients History and Physical, chart, labs and discussed the procedure including the risks, benefits and alternatives for the proposed anesthesia with the patient or authorized representative who has indicated his/her understanding and acceptance.     Plan Discussed with: CRNA, Anesthesiologist and Surgeon  Anesthesia Plan Comments:         Anesthesia Quick Evaluation

## 2017-10-06 ENCOUNTER — Encounter (HOSPITAL_COMMUNITY): Payer: Self-pay | Admitting: Orthopedic Surgery

## 2017-10-06 DIAGNOSIS — K5790 Diverticulosis of intestine, part unspecified, without perforation or abscess without bleeding: Secondary | ICD-10-CM | POA: Diagnosis not present

## 2017-10-06 DIAGNOSIS — Z969 Presence of functional implant, unspecified: Secondary | ICD-10-CM

## 2017-10-06 DIAGNOSIS — E785 Hyperlipidemia, unspecified: Secondary | ICD-10-CM | POA: Diagnosis not present

## 2017-10-06 DIAGNOSIS — G479 Sleep disorder, unspecified: Secondary | ICD-10-CM | POA: Diagnosis not present

## 2017-10-06 DIAGNOSIS — I10 Essential (primary) hypertension: Secondary | ICD-10-CM | POA: Diagnosis not present

## 2017-10-06 DIAGNOSIS — M1712 Unilateral primary osteoarthritis, left knee: Secondary | ICD-10-CM | POA: Diagnosis not present

## 2017-10-06 DIAGNOSIS — R29810 Facial weakness: Secondary | ICD-10-CM | POA: Diagnosis not present

## 2017-10-06 DIAGNOSIS — Z472 Encounter for removal of internal fixation device: Secondary | ICD-10-CM | POA: Diagnosis not present

## 2017-10-06 DIAGNOSIS — Z9889 Other specified postprocedural states: Secondary | ICD-10-CM

## 2017-10-06 DIAGNOSIS — K219 Gastro-esophageal reflux disease without esophagitis: Secondary | ICD-10-CM | POA: Diagnosis not present

## 2017-10-06 DIAGNOSIS — I4891 Unspecified atrial fibrillation: Secondary | ICD-10-CM | POA: Diagnosis not present

## 2017-10-06 DIAGNOSIS — Z7901 Long term (current) use of anticoagulants: Secondary | ICD-10-CM | POA: Diagnosis not present

## 2017-10-06 LAB — BASIC METABOLIC PANEL
Anion gap: 9 (ref 5–15)
BUN: 14 mg/dL (ref 6–20)
CHLORIDE: 101 mmol/L (ref 101–111)
CO2: 26 mmol/L (ref 22–32)
CREATININE: 0.6 mg/dL — AB (ref 0.61–1.24)
Calcium: 8.9 mg/dL (ref 8.9–10.3)
GFR calc Af Amer: >60 mL/min (ref >60)
GFR calc non Af Amer: >60 mL/min (ref >60)
Glucose, Bld: 150 mg/dL — ABNORMAL HIGH (ref 65–99)
Potassium: 3.9 mmol/L (ref 3.5–5.1)
SODIUM: 136 mmol/L (ref 135–145)

## 2017-10-06 LAB — CBC
HEMATOCRIT: 35.7 % — AB (ref 39.0–52.0)
HEMOGLOBIN: 12.3 g/dL — AB (ref 13.0–17.0)
MCH: 33.4 pg (ref 26.0–34.0)
MCHC: 34.5 g/dL (ref 30.0–36.0)
MCV: 97 fL (ref 78.0–100.0)
Platelets: 241 10*3/uL (ref 150–400)
RBC: 3.68 MIL/uL — ABNORMAL LOW (ref 4.22–5.81)
RDW: 14.3 % (ref 11.5–15.5)
WBC: 14 10*3/uL — AB (ref 4.0–10.5)

## 2017-10-06 MED ORDER — METHOCARBAMOL 500 MG PO TABS: 500.0000 mg | ORAL_TABLET | Freq: Four times a day (QID) | ORAL | 0 refills | Status: DC | PRN

## 2017-10-06 MED ORDER — AMLODIPINE BESYLATE 5 MG PO TABS
2.5000 mg | ORAL_TABLET | Freq: Every day | ORAL | Status: DC
  Administered 2017-10-06: 2.5 mg via ORAL
  Filled 2017-10-06: qty 1

## 2017-10-06 MED ORDER — METOPROLOL TARTRATE 50 MG PO TABS: 50.0000 mg | ORAL_TABLET | Freq: Two times a day (BID) | ORAL | Status: DC

## 2017-10-06 MED ORDER — APIXABAN 5 MG PO TABS
5.0000 mg | ORAL_TABLET | Freq: Two times a day (BID) | ORAL | Status: DC
  Administered 2017-10-06: 5 mg via ORAL
  Filled 2017-10-06: qty 1

## 2017-10-06 MED ORDER — HYDROCODONE-ACETAMINOPHEN 7.5-325 MG PO TABS: 1.0000 | ORAL_TABLET | ORAL | 0 refills | Status: DC | PRN

## 2017-10-06 MED ORDER — METOPROLOL TARTRATE 50 MG PO TABS: 50.0000 mg | ORAL_TABLET | Freq: Two times a day (BID) | ORAL | 0 refills | Status: DC

## 2017-10-06 MED ORDER — ASPIRIN 81 MG PO CHEW: 81.0000 mg | CHEWABLE_TABLET | Freq: Every day | ORAL | Status: DC

## 2017-10-06 MED ORDER — APIXABAN 5 MG PO TABS: 5.0000 mg | ORAL_TABLET | Freq: Two times a day (BID) | ORAL | 0 refills | Status: DC

## 2017-10-06 MED ORDER — AMLODIPINE BESYLATE 5 MG PO TABS: 2.5000 mg | ORAL_TABLET | Freq: Every day | ORAL | 4 refills | Status: DC

## 2017-10-06 NOTE — Evaluation (Signed)
Physical Therapy One Time Evaluation Patient Details Name: Eugene White MRN: [id number] DOB: [birth year] Today's Date: 10/06/2017   History of Present Illness  71 y.o. 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 He had a mechanical fall 09/2016 resulting in hip fracture, s/p left femoral nail.  Pt s/p removal of left femoral nail on 10/05/17 and had new onset afib.  Clinical Impression  Patient evaluated by Physical Therapy with no further acute PT needs identified. All education has been completed and the patient has no further questions.  Pt able to perform good distance of ambulation with controlled pain.  Pt plans to have L TKA at the end of November.  Pt encouraged to ambulate every 1-2 hours in his home and perform ankle pumps upon d/c.  Pt feels comfortable with steps as he reports his pain is better now then prior to surgery (also hx of R TKA).  Pt feels ready for d/c home pending cardiology consult. See below for any follow-up Physical Therapy or equipment needs. PT is signing off. Thank you for this referral.      Follow Up Recommendations No PT follow up    Equipment Recommendations  None recommended by PT    Recommendations for Other Services       Precautions / Restrictions Precautions Precautions: Fall Restrictions Weight Bearing Restrictions: Yes LLE Weight Bearing: Weight bearing as tolerated      Mobility  Bed Mobility Overal bed mobility: Needs Assistance Bed Mobility: Supine to Sit;Sit to Supine     Supine to sit: Supervision Sit to supine: Supervision   General bed mobility comments: pt self assisted L LEs with UEs  Transfers Overall transfer level: Needs assistance Equipment used: Rolling walker (2 wheeled) Transfers: Sit to/from Stand Sit to Stand: Min guard         General transfer comment: verbal cues for hand placement  Ambulation/Gait Ambulation/Gait assistance: Min  guard Ambulation Distance (Feet): 300 Feet Assistive device: Rolling walker (2 wheeled) Gait Pattern/deviations: Step-through pattern;Decreased stance time - left;Antalgic     General Gait Details: verbal cues for use of RW, pt reports pain controlled, HR up to 108 bpm, pt denies any symptoms  Stairs            Wheelchair Mobility    Modified Rankin (Stroke Patients Only)       Balance Overall balance assessment:  (denies recent falls)                                           Pertinent Vitals/Pain Pain Assessment: 0-10 Pain Score: 3  Pain Location: L hip Pain Descriptors / Indicators: Sore;Aching Pain Intervention(s): Limited activity within patient's tolerance;Repositioned;Monitored during session    Home Living Family/patient expects to be discharged to:: Private residence Living Arrangements: Spouse/significant other Available Help at Discharge: Family Type of Home: House Home Access: Stairs to enter Entrance Stairs-Rails: None Entrance Stairs-Number of Steps: 2 Home Layout: Able to live on main level with bedroom/bathroom Home Equipment: Environmental consultant - 2 wheels;Cane - single point;Bedside commode;Tub bench      Prior Function Level of Independence: Independent with assistive device(s)         Comments: pt reports using SPC for L hip pain prior to surgery     Hand Dominance        Extremity/Trunk Assessment  Lower Extremity Assessment Lower Extremity Assessment: LLE deficits/detail LLE Deficits / Details: able to move LE against gravity however not yet full range as anticipated post op, able to perform ankle pumps       Communication   Communication: No difficulties  Cognition Arousal/Alertness: Awake/alert Behavior During Therapy: WFL for tasks assessed/performed Overall Cognitive Status: Within Functional Limits for tasks assessed                                        General Comments       Exercises     Assessment/Plan    PT Assessment Patent does not need any further PT services  PT Problem List         PT Treatment Interventions      PT Goals (Current goals can be found in the Care Plan section)  Acute Rehab PT Goals PT Goal Formulation: All assessment and education complete, DC therapy    Frequency     Barriers to discharge        Co-evaluation               AM-PAC PT "6 Clicks" Daily Activity  Outcome Measure Difficulty turning over in bed (including adjusting bedclothes, sheets and blankets)?: A Little Difficulty moving from lying on back to sitting on the side of the bed? : A Little Difficulty sitting down on and standing up from a chair with arms (e.g., wheelchair, bedside commode, etc,.)?: A Little Help needed moving to and from a bed to chair (including a wheelchair)?: A Little Help needed walking in hospital room?: A Little Help needed climbing 3-5 steps with a railing? : A Little 6 Click Score: 18    End of Session Equipment Utilized During Treatment: Gait belt Activity Tolerance: Patient tolerated treatment well Patient left: in bed;with call bell/phone within reach;with family/visitor present Nurse Communication: Mobility status PT Visit Diagnosis: Difficulty in walking, not elsewhere classified (R26.2)    Time: 1001-1020 PT Time Calculation (min) (ACUTE ONLY): 19 min   Charges:   PT Evaluation $PT Eval Low Complexity: 1 Low     PT G CodesCarmelia Bake, PT, DPT 10/06/2017 Pager: [phone] [place] Ram E 10/06/2017, 10:33 AM

## 2017-10-06 NOTE — Evaluation (Signed)
Occupational Therapy Evaluation Patient Details Name: Eugene White MRN: [id number] DOB: 1946-06-16 Today's Date: 10/06/2017    History of Present Illness 71 y.o. 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 He had a mechanical fall 09/2016 resulting in hip fracture, s/p left femoral nail.  Pt s/p removal of left femoral nail on 10/05/17 and had new onset afib.   Clinical Impression   This 71 y/o M presents with the above. At baseline Pt is mod independent with ADLs and functional mobility using SPC. Pt completed room level functional mobility and functional mobility transfers at RW level with MinGuard assist this session. Requires Min-ModA for LB ADLs secondary to L LE functional limitations. Pt reports spouse will be available to assist with ADLs PRN after return home. Pt reports feeling comfortable completing ADLs at home and with available spouse assist, education provided during session and questions answered. No further acute OT needs identified at this time. Will sign off.     Follow Up Recommendations  DC plan and follow up therapy as arranged by surgeon;Supervision/Assistance - 24 hour    Equipment Recommendations  None recommended by OT           Precautions / Restrictions Precautions Precautions: Fall Restrictions Weight Bearing Restrictions: Yes LLE Weight Bearing: Weight bearing as tolerated      Mobility Bed Mobility Overal bed mobility: Needs Assistance Bed Mobility: Supine to Sit;Sit to Supine     Supine to sit: Supervision Sit to supine: Supervision   General bed mobility comments: supervision for safety   Transfers Overall transfer level: Needs assistance Equipment used: Rolling walker (2 wheeled) Transfers: Sit to/from Stand Sit to Stand: Min guard         General transfer comment: verbal cues for hand placement    Balance Overall balance assessment:  (denies recent falls)                                         ADL either performed or assessed with clinical judgement   ADL Overall ADL's : Needs assistance/impaired Eating/Feeding: Set up;Sitting   Grooming: Min guard;Standing   Upper Body Bathing: Min guard;Sitting   Lower Body Bathing: Sit to/from stand;Min guard   Upper Body Dressing : Set up;Sitting   Lower Body Dressing: Minimal assistance;Sit to/from stand Lower Body Dressing Details (indicate cue type and reason): educated on compensatory techniques for completing task  Toilet Transfer: Min guard;Ambulation;BSC;RW Toilet Transfer Details (indicate cue type and reason): BSC over toilet  Toileting- Clothing Manipulation and Hygiene: Min guard;Sit to/from stand       Functional mobility during ADLs: Min guard;Rolling walker General ADL Comments: Pt requires min verbal cues for safe RW use during session                          Pertinent Vitals/Pain Pain Assessment: Faces Pain Score: 3  Faces Pain Scale: Hurts a little bit Pain Location: L hip Pain Descriptors / Indicators: Sore;Aching Pain Intervention(s): Monitored during session;Repositioned          Extremity/Trunk Assessment Upper Extremity Assessment Upper Extremity Assessment: Overall WFL for tasks assessed   Lower Extremity Assessment Lower Extremity Assessment: Defer to PT evaluation LLE Deficits / Details: able to move LE against gravity however not yet full range as anticipated post op, able to  perform ankle pumps       Communication Communication Communication: No difficulties   Cognition Arousal/Alertness: Awake/alert Behavior During Therapy: WFL for tasks assessed/performed Overall Cognitive Status: Within Functional Limits for tasks assessed                                                      Home Living Family/patient expects to be discharged to:: Private residence Living Arrangements: Spouse/significant other Available  Help at Discharge: Family Type of Home: House Home Access: Stairs to enter Technical brewer of Steps: 2 Entrance Stairs-Rails: None Home Layout: Able to live on main level with bedroom/bathroom     Bathroom Shower/Tub: Teacher, early years/pre: Handicapped height     Home Equipment: Environmental consultant - 2 wheels;Cane - single point;Bedside commode;Tub bench          Prior Functioning/Environment Level of Independence: Independent with assistive device(s)        Comments: pt reports using SPC for L hip pain prior to surgery        OT Problem List: Decreased strength;Decreased activity tolerance;Decreased knowledge of use of DME or AE;Decreased safety awareness;Pain            OT Goals(Current goals can be found in the care plan section) Acute Rehab OT Goals Patient Stated Goal: return home today  OT Goal Formulation: All assessment and education complete, DC therapy                                 AM-PAC PT "6 Clicks" Daily Activity     Outcome Measure Help from another person eating meals?: None Help from another person taking care of personal grooming?: A Little Help from another person toileting, which includes using toliet, bedpan, or urinal?: A Little Help from another person bathing (including washing, rinsing, drying)?: A Little Help from another person to put on and taking off regular upper body clothing?: None Help from another person to put on and taking off regular lower body clothing?: A Little 6 Click Score: 20   End of Session Equipment Utilized During Treatment: Rolling walker Nurse Communication: Mobility status  Activity Tolerance: Patient tolerated treatment well Patient left: in bed;with call bell/phone within reach;with bed alarm set  OT Visit Diagnosis: Other abnormalities of gait and mobility (R26.89)                Time: 4742-5956 OT Time Calculation (min): 14 min Charges:  OT General Charges $OT Visit: 1 Visit OT  Evaluation $OT Eval Low Complexity: 1 Low G-Codes:     Lou Cal, OT Pager [phone] 10/06/2017  Raymondo Band 10/06/2017, 11:26 AM

## 2017-10-06 NOTE — Progress Notes (Signed)
Patient ID: Eugene White, male   DOB: Sep 26, 1946, 71 y.o.   MRN: [id number] Subjective: 1 Day Post-Op Procedure(s) (LRB): Removal of left femoral nail (Left)    Patient reports pain as mild.  Noted some rather acute feeling swelling at distal incision area last night controlled with ACE wrap, better now.  Asymptomatic with regards to his heart, no chest pains  Objective:   VITALS:   Vitals:   10/06/17 0220 10/06/17 0615  BP: (!) 121/94 (!) 118/91  Pulse: 88 73  Resp: 20 18  Temp: 98.2 F (36.8 C) 97.8 F (36.6 C)  SpO2: 95% 100%    Neurovascular intact Incision: dressing C/D/I - some distal thigh swelling but dressing dry and intact  LABS  Recent Labs  10/06/17 0435  HGB 12.3*  HCT 35.7*  WBC 14.0*  PLT 241     Recent Labs  10/06/17 0435  NA 136  K 3.9  BUN 14  CREATININE 0.60*  GLUCOSE 150*    No results for input(s): LABPT, INR in the last 72 hours.   Assessment/Plan: 1 Day Post-Op Procedure(s) (LRB): Removal of left femoral nail (Left)   Advance diet Up with therapy   Plan for discharge home today with cardiology recommendations WBAT LLE RTC in 2 weeks Will plan to continue course despite a-fib issue

## 2017-10-06 NOTE — Progress Notes (Addendum)
Progress Note  Patient Name: Eugene White Date of Encounter: 10/06/2017  Primary Cardiologist: Dr. [name] ([place])   Subjective   Asymptomatic with his afib. Left hip feels fine.   Inpatient Medications    Scheduled Meds: . amLODipine  5 mg Oral Daily  . aspirin  81 mg Oral BID  . atorvastatin  20 mg Oral Daily  . docusate sodium  100 mg Oral BID  . ferrous sulfate  325 mg Oral TID PC  . irbesartan  150 mg Oral Daily   And  . hydrochlorothiazide  25 mg Oral Daily  . HYDROcodone-acetaminophen  1-2 tablet Oral Q4H  . levothyroxine  150 mcg Oral QAC breakfast  . metoprolol tartrate  25 mg Oral BID  . pantoprazole  40 mg Oral Daily  . polyethylene glycol  17 g Oral BID  . venlafaxine XR  75 mg Oral Daily   Continuous Infusions: . sodium chloride 100 mL/hr at 10/05/17 1219  . methocarbamol (ROBAXIN)  IV     PRN Meds: alum & mag hydroxide-simeth, bisacodyl, diphenhydrAMINE, fentaNYL (SUBLIMAZE) injection, HYDROmorphone (DILAUDID) injection, magnesium citrate, menthol-cetylpyridinium **OR** phenol, methocarbamol **OR** methocarbamol (ROBAXIN)  IV, metoCLOPramide **OR** metoCLOPramide (REGLAN) injection, ondansetron **OR** ondansetron (ZOFRAN) IV   Vital Signs    Vitals:   10/05/17 1515 10/05/17 2046 10/06/17 0220 10/06/17 0615  BP: 130/89 (!) 141/92 (!) 121/94 (!) 118/91  Pulse: 77 99 88 73  Resp: 18 18 20 18   Temp: 97.8 F (36.6 C) 97.8 F (36.6 C) 98.2 F (36.8 C) 97.8 F (36.6 C)  TempSrc: Oral Oral Oral Oral  SpO2: 100% 96% 95% 100%  Weight:      Height:        Intake/Output Summary (Last 24 hours) at 10/06/17 3818 Last data filed at 10/06/17 0700  Gross per 24 hour  Intake          2908.33 ml  Output             3000 ml  Net           -91.67 ml   Filed Weights   10/05/17 0544  Weight: 217 lb (98.4 kg)    Telemetry    afib w/ CVR in the 80s - Personally Reviewed  ECG    Atrial fibrillation - Personally Reviewed  Physical Exam   GEN: No  acute distress. Chronic facial droop Neck: No JVD Cardiac: irregularlly irregular, regular rate, no murmurs, rubs, or gallops.  Respiratory: Clear to auscultation bilaterally. GI: Soft, nontender, non-distended  MS: No edema; No deformity. Neuro:  Nonfocal  Psych: Normal affect   Labs    Chemistry Recent Labs Lab 10/06/17 0435  NA 136  K 3.9  CL 101  CO2 26  GLUCOSE 150*  BUN 14  CREATININE 0.60*  CALCIUM 8.9  GFRNONAA >60  GFRAA >60  ANIONGAP 9     Hematology Recent Labs Lab 10/06/17 0435  WBC 14.0*  RBC 3.68*  HGB 12.3*  HCT 35.7*  MCV 97.0  MCH 33.4  MCHC 34.5  RDW 14.3  PLT 241    Cardiac EnzymesNo results for input(s): TROPONINI in the last 168 hours. No results for input(s): TROPIPOC in the last 168 hours.   BNPNo results for input(s): BNP, PROBNP in the last 168 hours.   DDimer No results for input(s): DDIMER in the last 168 hours.   Radiology    Dg Pelvis Portable  Result Date: 10/05/2017 CLINICAL DATA:  Status post removal of hip hardware.  EXAM: PORTABLE PELVIS 1-2 VIEWS COMPARISON:  10/10/2016 FINDINGS: There is soft tissue gas around the left hip. A screw tract is seen to the intertrochanteric femur and femoral neck which distorted from prior fracture. No acute fracture line is seen in this one view. Both hips are located. IMPRESSION: No acute finding after left femur hardware removal. Electronically Signed   By: Monte Fantasia M.D.   On: 10/05/2017 09:53    Cardiac Studies   2D Echo 10/05/17  Study Conclusions  - Left ventricle: The cavity size was normal. Wall thickness was   increased in a pattern of mild LVH. Systolic function was normal.   The estimated ejection fraction was in the range of 55% to 60%.   Wall motion was normal; there were no regional wall motion   abnormalities. - Left atrium: The atrium was mildly dilated.  Impressions:  - Normal LV function; mild LVH; mild LAE.  Patient Profile     Eugene White is a  71 y.o. 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 He had a mechanical fall 09/2016 resulting in hip fracture, treated with surgery with left femoral nail. Unfortunately, he has had significant pain from the nail and came in for elective removal. During surgery, pt was noted to go into afib w/ RVR.   Assessment & Plan    1. New Onset Afib w/ RVR: perioperative afib during orthopedic surgery. Was in NSR prior to surgery. He still remains in afib but with improved rate in the 80s after addition of metoprolol. He is asymptomatic. BP stable.  2D echo with normal LVEF and mildly dilated LA. K WNL. His CHA2DS2 VASc score is 2 for HTN and age 10-74. Given he remains in afib, recommend initiation of [place] for stroke prophylaxis, if ok from a surgical standpoint. Renal function is normal. Can consider Eliquis 5 mg BID. MD to follow with further recs.   For questions or updates, please contact [place] Please consult [email or web address] for contact info under Cardiology/STEMI.     Signed, Lyda Jester, PA-C  10/06/2017, 8:22 AM    The patient was seen, examined and discussed with Brittainy M. Rosita Fire, PA-C and I agree with the above.   71 y.o. 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 He was admitted today for removal of a L femoral nail. He fell and broke his hip 09/2016 and had the nail inserted. It was causing significant pain, mostly in his L knee. He also has OA in his L knee and needs TKR.  During the surgery, the patient was noted to go into atrial fib, controlled rate in 80'echocardiogram shows LVEF 55-60%, mild LAE.  In SR on ECG in 2017. The patient has no prior history of cardiac disease, he is unaware that he is in atrial fibrillation denies any chest pain or shortness of breath. Physical exam  doesn't reveal any signs of CHF.  Started on metoprolol 25 mg by mouth twice a day, I would increase to 50 mg po BID, CHADS2VASC= 2 (HTN, age x 1), he is currently on no anticoagulation, he should be started on NOAC as soon as acceptable from surgical standpoint. I would suggest eliquis 5 mg po BID. I would decrease amlodipine to 2.5 mg po daily given that we are increasing  metoprolol.  If the patient remains in a-fib, the plan would be to cardiovert after 4 weeks of anticoagulation.  He is being discharged today, we will arrange for a follow up in our clinic.  Ena Dawley, MD 10/06/2017

## 2017-10-06 NOTE — Op Note (Signed)
NAMEBOOMER, WINDERS               ACCOUNT NO.:  [id number]  MEDICAL RECORD NO.:  [id number]  LOCATION:                                 FACILITY:  PHYSICIAN:  Pietro Cassis. [name], M.D.       DATE OF BIRTH:  DATE OF PROCEDURE:  10/05/2017 DATE OF DISCHARGE:                              OPERATIVE REPORT   PREOPERATIVE DIAGNOSES: 1. Retained left femoral nail following open reduction and internal     fixation of an intertrochanteric femur fracture. 2. Advanced left knee osteoarthritis and pain.  POSTOPERATIVE DIAGNOSES: 1. Retained left femoral nail following open reduction and internal     fixation of an intertrochanteric femur fracture. 2. Advanced left knee osteoarthritis and pain.  PROCEDURE:  Removal of left hip deep implant.  This included a distal interlock screw, proximal lag screw, and an intramedullary nail.  SURGEON:  Pietro Cassis. [name], M.D.  ASSISTANT:  Danae Orleans, PA  ANESTHESIA:  Spinal.  SPECIMENS:  None.  COMPLICATIONS:  None.  BLOOD LOSS:  About 100 mL or less.  INDICATIONS FOR PROCEDURE:  [name] is a 71 year old male who had sustained a left intertrochanteric femur fracture.  He had an open reduction and internal fixation that was performed by the outside surgeon with a long intramedullary nail.  I had subsequently followed him to union of his fracture.  However, his predominant issue at this point has been an advanced left knee osteoarthritis and pain.  We have tried measures to try to decrease symptoms of his left knee, however, it has been unsuccessful.  At this point, he is wishing to proceed with total knee replacement.  Given the nature of his intramedullary nail and the extent that it was needed to be removed prior to knee replacement surgery, he wished to proceed in a staged fashion as outlined.  We reviewed the risks of fracture at the sites of his previous screws.  We discussed risks of infection.  His intertrochanteric femur fracture had  united without issue.  Consent was obtained.  PROCEDURE IN DETAIL:  The patient was brought to the operative theater. Once adequate anesthesia, preoperative antibiotics, Ancef administered, he was positioned into the right lateral decubitus position with the left side up.  The left lower extremity was then prepped and draped in sterile fashion.  A time-out was performed, identifying the patient, planned procedure, and extremity.  His previous incisions were all identified and marked prior to draping.  I first removed the distal interlock over the distal femur.  This was done without issue.  Next, we created incisions over the proximal aspect of the hip.  On the most proximal aspect of the femur, I had to split the gluteal fascia.  The gluteal muscle fibers were split and I was able to readily identify the proximal aspect of the nail.  Once I cleared this and removed bone from around it, I was able to expose the screws for removal.  Once this was done, I had to extend the lag screw incision a little bit in order for palpation.  I then was able to place a guidewire into this and then inserted the screwdriver and locked the screw  onto the screwdriver.  Once this was done, I backed the locking bolt out enough, so I could remove the screw.  Once this was done, the nail removing bolt was placed onto the proximal femur.  Once this was in place, the lag screw was removed.  I then was able to easily back the nail out.  On way of the nail out, I then debrided the areas a little bit further, irrigated the wounds.  The gluteal fascia was reapproximated using #1 Vicryl.  The iliotibial band over the lag screw incision was reapproximated using #1 Vicryl.  The remainder of the wounds were closed with 2-0 Vicryl and running Monocryl sutures.  Thus, the wounds were then cleaned, dried, and dressed sterilely using surgical glue and an Aquacel dressing.  He was then brought to the recovery room in  stable condition.  We will keep him in the hospital overnight for observation, pain control, and mobility.  He is already scheduled to have his knee replacement done in latter half in November.     Pietro Cassis [name], M.D.     MDO/MEDQ  D:  10/05/2017  T:  10/06/2017  Job:  [id number]

## 2017-10-06 NOTE — Discharge Instructions (Signed)

## 2017-10-06 NOTE — Progress Notes (Addendum)
ANTICOAGULATION CONSULT NOTE - Initial Consult  Pharmacy Consult for Eliquis Indication: atrial fibrillation  Allergies  Allergen Reactions  . Sulfa Antibiotics Other (See Comments)    unknown    Patient Measurements: Height: 5\' 11"  (180.3 cm) Weight: 217 lb (98.4 kg) IBW/kg (Calculated) : 75.3  Vital Signs: Temp: 97.8 F (36.6 C) (10/09 0615) Temp Source: Oral (10/09 0615) BP: 139/97 (10/09 1208) Pulse Rate: 73 (10/09 0913)  Labs:  Recent Labs  10/06/17 0435  HGB 12.3*  HCT 35.7*  PLT 241  CREATININE 0.60*    Estimated Creatinine Clearance: 99.8 mL/min (A) (by C-G formula based on SCr of 0.6 mg/dL (L)).   Medical History: Past Medical History:  Diagnosis Date  . Anemia    hx of one time  . Arthritis    OA  . Atrial fibrillation with controlled ventricular rate ([place]) 10/05/2017  . Difficulty sleeping   . Diverticulosis 2014  . 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 GERD (gastroesophageal reflux disease)   . GI symptom    had nausea / vomited once / frequent stools / getting better  . Hyperlipidemia   . Hypertension   . Mood changes   . Pneumonia    hx of  . Thyroid disease    hypo    Medications:  Prescriptions Prior to Admission  Medication Sig Dispense Refill Last Dose  . amLODipine (NORVASC) 5 MG tablet TAKE 1 TABLET EVERY DAY (Patient taking differently: TAKE 1 TABLET EVERY DAILY AT NOON.) 90 tablet 4 10/05/2017 at 0430  . aspirin EC 81 MG tablet Take 81 mg by mouth daily at 12 noon.    Past Week at Unknown time  . atorvastatin (LIPITOR) 20 MG tablet TAKE 1 TABLET EVERY DAY (Patient taking differently: TAKE 1 TABLET EVERY DAILY AT NOON.) 90 tablet 4 10/05/2017 at 0430  . Cholecalciferol (VITAMIN D) 2000 UNITS CAPS Take 2,000 Units by mouth daily at 12 noon.    Past Week at Unknown time  . ibuprofen (ADVIL,MOTRIN) 200 MG tablet Take 800 mg by mouth every 8 (eight) hours as needed (for pain.).   Past Month at Unknown time   . levothyroxine (SYNTHROID, LEVOTHROID) 150 MCG tablet TAKE 1 TABLET EVERY DAY (Patient taking differently: TAKE 1 TABLET EVERY DAILY AT NOON.) 90 tablet 4 10/05/2017 at 0430  . meloxicam (MOBIC) 15 MG tablet TAKE 1 TABLET EVERY DAY AS NEEDED (Patient taking differently: TAKE 1 TABLET EVERY DAILY AT NOON.) 90 tablet 4 Past Week at Unknown time  . Multiple Vitamin (MULTIVITAMIN WITH MINERALS) TABS tablet Take 1 tablet by mouth daily at 12 noon.    Past Week at Unknown time  . Multiple Vitamins-Minerals (PRESERVISION AREDS 2 PO) Take 2 tablets by mouth daily at 12 noon.   Past Week at Unknown time  . pantoprazole (PROTONIX) 40 MG tablet TAKE 1 TABLET EVERY DAY (Patient taking differently: TAKE 1 TABLET EVERY DAILY AT NOON.) 90 tablet 4 10/05/2017 at 0430  . polycarbophil (FIBERCON) 625 MG tablet Take 1,250 mg by mouth daily at 12 noon.    10/04/2017 at Unknown time  . valsartan-hydrochlorothiazide (DIOVAN-HCT) 160-25 MG tablet TAKE 1 TABLET EVERY DAY (Patient taking differently: TAKE 1 TABLET EVERY DAILY AT NOON.) 90 tablet 3 Past Week at Unknown time  . venlafaxine XR (EFFEXOR-XR) 75 MG 24 hr capsule TAKE 1 CAPSULE EVERY DAY (Patient taking differently: TAKE 1 CAPSULE EVERY DAILY AT NOON.) 90 capsule 3 10/05/2017 at 0430  Assessment: 71 yo M with new onset Afib w/ RVR.  Pharmacy consulted to dose Eliquis. Wt 98 kg, age 71, creat 0.6.  CBC OK.  OR 10/8 removal of L hip implant.    Plan:  Eliquis 5 mg po bid Will give pt 30day free card and educate pt on Eliquis I spoke w/ cardiology and he does not need to be on aspirin, so will DC aspirin  Eudelia Bunch, Pharm.D. [phone] 10/06/2017 12:40 PM

## 2017-10-06 NOTE — Progress Notes (Signed)
Pt and Wife given discharge instructions including medication teaching. Pharmacy has consulted and talked with Pt regarding Eliquis. Pt and Wife verbalized understanding of all discharge teaching and instructions. Discharge Packet with Prescriptions with Pt and Wife at time of discharge. VSS at time of discharge.

## 2017-10-07 NOTE — Progress Notes (Signed)
PT Evaluation G-Codes    10/06/17 1033  PT Time Calculation  PT Start Time (ACUTE ONLY) 1001  PT Stop Time (ACUTE ONLY) 1020  PT Time Calculation (min) (ACUTE ONLY) 19 min  PT G-Codes **NOT FOR INPATIENT CLASS**  Functional Assessment Tool Used AM-PAC 6 Clicks Basic Mobility;Clinical judgement  Functional Limitation Mobility: Walking and moving around  Mobility: Walking and Moving Around Current Status (U8891) CJ  Mobility: Walking and Moving Around Goal Status (Q9450) CI  Mobility: Walking and Moving Around Discharge Status (T8882) CI  PT General Charges  $$ ACUTE PT VISIT 1 Visit  PT Evaluation  $PT Eval Low Complexity 1 Low   Carmelia Bake, PT, DPT 10/07/2017 Pager: [phone]

## 2017-10-08 NOTE — Discharge Summary (Signed)
Physician Discharge Summary  Patient ID: Eugene White MRN: [id number] DOB/AGE: 01-Nov-1946 71 y.o.  Admit date: 10/05/2017 Discharge date: 10/06/2017   Procedures:  Procedure(s) (LRB): Removal of left femoral nail (Left)  Attending Physician:  Dr. [name]   Admission Diagnoses:    S/P left femoral nailing with knee OA / pain  Discharge Diagnoses:  Principal Problem:   Left femur IM nail removal Active Problems:   Atrial fibrillation with controlled ventricular rate ([place])   History of removal of retained hardware  Past Medical History:  Diagnosis Date  . Anemia    hx of one time  . Arthritis    OA  . Atrial fibrillation with controlled ventricular rate ([place]) 10/05/2017  . Difficulty sleeping   . Diverticulosis 2014  . 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 GERD (gastroesophageal reflux disease)   . GI symptom    had nausea / vomited once / frequent stools / getting better  . Hyperlipidemia   . Hypertension   . Mood changes   . Pneumonia    hx of  . Thyroid disease    hypo    HPI:    Pt is a 71 y.o. male complaining of left knee pain for years. Pain had continually increased since the beginning.  Patient had a femoral nailing in 09/2016.  He feels that he has healed from this surgery, but does have some pain in the area of the proximal bolt.  Patient also has primary OA of the left knee which is giving him more issues.  Discussion with Dr. [name] and the patient led to removal of the left femoral nail and eventually proceed with a left TKA once the leg has healed.   X-rays in the clinic show end-stage arthritic changes of the left knee as well a left femoral nail.  Pt has tried various conservative treatments which have failed to alleviate their symptoms, including NSAIDs/analgesic medications, activity modifications and assistance devices . Various options are discussed with the patient. Risks, benefits and expectations were discussed with  the patient. Patient understand the risks, benefits and expectations and wishes to proceed with surgery.  PCP: [name], PA   Discharged Condition: good  Hospital Course:  Patient underwent the above stated procedure on 10/05/2017. Patient tolerated the procedure well and brought to the recovery room in good condition and subsequently to the floor.  POD #1 BP: 118/91 ; Pulse: 73 ; Temp: 97.8 F (36.6 C) ; Resp: 18 Patient reports pain as mild.  Noted some rather acute feeling swelling at distal incision area last night controlled with ACE wrap, better now. Asymptomatic with regards to his heart, no chest pains.  Cardiology saw the patient, made changes to his meds and placed him on Eliquis.  He will follow up with them with regards to the a-fib.  Ready to be discharged home. Neurovascular intact.  Incision: dressing C/D/I - some distal thigh swelling but dressing dry and intact.  LABS  Basename    HGB     12.3  HCT     35.7    Discharge Exam: General appearance: alert, cooperative and no distress Extremities: Homans sign is negative, no sign of DVT, no edema, redness or tenderness in the calves or thighs and no ulcers, gangrene or trophic changes  Disposition: Home with follow up in 2 weeks   Follow-up Information    [name], MD. Schedule an appointment as soon as  possible for a visit in 2 week(s).   Specialty:  Orthopedic Surgery Contact information: [address] [place] [address] [phone]           Discharge Instructions    Call MD / Call 911    Complete by:  As directed    If you experience chest pain or shortness of breath, CALL 911 and be transported to the hospital emergency room.  If you develope a fever above 101 F, pus (white drainage) or increased drainage or redness at the wound, or calf pain, call your surgeon's office.   Constipation Prevention    Complete by:  As directed    Drink plenty of fluids.  Prune juice may  be helpful.  You may use a stool softener, such as Colace (over the counter) 100 mg twice a day.  Use MiraLax (over the counter) for constipation as needed.   Diet - low sodium heart healthy    Complete by:  As directed    Discharge instructions    Complete by:  As directed    Maintain surgical dressing until follow up in the clinic. If the edges start to pull up, may reinforce with tape. If the dressing is no longer working, may remove and cover with gauze and tape, but must keep the area dry and clean.  Follow up in 2 [facility] at [facility]. Call with any questions or concerns.   Increase activity slowly as tolerated    Complete by:  As directed    Weight bearing as tolerated with assist device (walker, cane, etc) as directed, use it as long as suggested by your surgeon or therapist, typically at least 4-6 weeks.      Allergies as of 10/06/2017      Reactions   Sulfa Antibiotics Other (See Comments)   unknown      Medication List    STOP taking these medications   aspirin EC 81 MG tablet   ibuprofen 200 MG tablet Commonly known as:  ADVIL,MOTRIN   meloxicam 15 MG tablet Commonly known as:  MOBIC     TAKE these medications   amLODipine 5 MG tablet Commonly known as:  NORVASC Take 0.5 tablets (2.5 mg total) by mouth daily. What changed:  See the new instructions.   apixaban 5 MG Tabs tablet Commonly known as:  ELIQUIS Take 1 tablet (5 mg total) by mouth 2 (two) times daily.   atorvastatin 20 MG tablet Commonly known as:  LIPITOR TAKE 1 TABLET EVERY DAY What changed:  See the new instructions.   docusate sodium 100 MG capsule Commonly known as:  COLACE Take 1 capsule (100 mg total) by mouth 2 (two) times daily.   ferrous sulfate 325 (65 FE) MG tablet Commonly known as:  FERROUSUL Take 1 tablet (325 mg total) by mouth 3 (three) times daily with meals.   HYDROcodone-acetaminophen 7.5-325 MG tablet Commonly known as:  NORCO Take 1-2 tablets by mouth every 4  (four) hours as needed for moderate pain or severe pain.   levothyroxine 150 MCG tablet Commonly known as:  SYNTHROID, LEVOTHROID TAKE 1 TABLET EVERY DAY What changed:  See the new instructions.   methocarbamol 500 MG tablet Commonly known as:  ROBAXIN Take 1 tablet (500 mg total) by mouth every 6 (six) hours as needed for muscle spasms.   metoprolol tartrate 50 MG tablet Commonly known as:  LOPRESSOR Take 1 tablet (50 mg total) by mouth 2 (two) times daily.   multivitamin  with minerals Tabs tablet Take 1 tablet by mouth daily at 12 noon.   pantoprazole 40 MG tablet Commonly known as:  PROTONIX TAKE 1 TABLET EVERY DAY What changed:  See the new instructions.   polycarbophil 625 MG tablet Commonly known as:  FIBERCON Take 1,250 mg by mouth daily at 12 noon.   polyethylene glycol packet Commonly known as:  MIRALAX / GLYCOLAX Take 17 g by mouth 2 (two) times daily.   PRESERVISION AREDS 2 PO Take 2 tablets by mouth daily at 12 noon.   valsartan-hydrochlorothiazide 160-25 MG tablet Commonly known as:  DIOVAN-HCT TAKE 1 TABLET EVERY DAY What changed:  See the new instructions.   venlafaxine XR 75 MG 24 hr capsule Commonly known as:  EFFEXOR-XR TAKE 1 CAPSULE EVERY DAY What changed:  See the new instructions.   Vitamin D 2000 units Caps Take 2,000 Units by mouth daily at 12 noon.        Signed: [place] P[place]. Hersey Maclellan   PA-C  10/08/2017, 9:41 AM

## 2017-10-12 NOTE — Progress Notes (Signed)
Cardiology Office Note    Date:  10/13/2017   ID:  Eugene White, DOB 09/13/1946, MRN [id number]  PCP:  [name], PA  Cardiologist: Dr. [name]  Chief Complaint  Patient presents with  . Hospitalization Follow-up    History of Present Illness:  Eugene White is a 71 y.o. male with history of hypertension, HLD, hypothyroidism. He underwent surgery for left femoral nail removal. During surgery he went into atrial fib with RVR which was new for him. 2-D echo showed normal LV function with mildly dilated left atrium.CHADSVASC=2 for hypertension and age and he was started on Eliquis 5 mg BID with plans to cardiovert 4 weeks.  Patient comes in today accompanied by his wife. His total knee replacement scheduled for November 26. He doesn't feel good and atrial fibrillation since his medications were changed. His blood pressure is running low. He can't feel his heart beating irregular unless he takes his pulse. No fast heart rates. Denies chest pain and was fairly active until he had the nail removed from his hip.    Past Medical History:  Diagnosis Date  . Anemia    hx of one time  . Arthritis    OA  . Atrial fibrillation with controlled ventricular rate ([place]) 10/05/2017  . Difficulty sleeping   . Diverticulosis 2014  . 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 GERD (gastroesophageal reflux disease)   . GI symptom    had nausea / vomited once / frequent stools / getting better  . Hyperlipidemia   . Hypertension   . Mood changes   . Pneumonia    hx of  . Thyroid disease    hypo    Past Surgical History:  Procedure Laterality Date  . COLONOSCOPY  2014  . FEMUR IM NAIL Left 10/11/2016   Procedure: INTRAMEDULLARY (IM) NAIL FEMORAL;  Surgeon: [name], MD;  Location: [facility] ORS;  Service: Orthopedics;  Laterality: Left;  . HAND SURGERY   1973 / 2010 / 2015   right to release tendons  . HARDWARE REMOVAL Left 10/05/2017   Procedure:  Removal of left femoral nail;  Surgeon: [name], MD;  Location: [facility] ORS;  Service: Orthopedics;  Laterality: Left;  90 mins  . KNEE ARTHROSCOPY  1984 & 2001   twice  . NASAL SEPTUM SURGERY    . removal Left femoral nail      10/05/17 Dr. [name]  . [place] & 2006   Right and left  . 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 apixaban (ELIQUIS) 5 MG TABS tablet Take 1 tablet (5 mg total) by mouth 2 (two) times daily.  Marland Kitchen atorvastatin (LIPITOR) 20 MG tablet TAKE 1 TABLET EVERY DAY (Patient taking differently: TAKE 1 TABLET EVERY DAILY AT NOON.)  . Cholecalciferol (VITAMIN D) 2000 UNITS CAPS Take 2,000 Units by mouth daily at 12 noon.   . docusate sodium (COLACE) 100 MG capsule Take 1 capsule (100 mg total) by mouth 2 (two) times daily.  . ferrous sulfate (FERROUSUL) 325 (65 FE) MG tablet Take 1 tablet (325 mg total) by mouth 3 (three) times daily with meals.  Marland Kitchen HYDROcodone-acetaminophen (NORCO) 7.5-325 MG tablet Take 1-2 tablets by mouth every 4 (four)  hours as needed for moderate pain or severe pain.  Marland Kitchen levothyroxine (SYNTHROID, LEVOTHROID) 150 MCG tablet TAKE 1 TABLET EVERY DAY (Patient taking differently: TAKE 1 TABLET EVERY DAILY AT NOON.)  . methocarbamol (ROBAXIN) 500 MG tablet Take 1 tablet (500 mg total) by mouth every 6 (six) hours as needed for muscle spasms.  . metoprolol tartrate (LOPRESSOR) 50 MG tablet Take 1 tablet (50 mg total) by mouth 2 (two) times daily.  . Multiple Vitamin (MULTIVITAMIN WITH MINERALS) TABS tablet Take 1 tablet by mouth daily at 12 noon.   . Multiple Vitamins-Minerals (PRESERVISION AREDS 2 PO) Take 2 tablets by mouth daily at 12 noon.  . pantoprazole (PROTONIX) 40 MG tablet TAKE 1 TABLET EVERY DAY (Patient taking differently: TAKE 1 TABLET  EVERY DAILY AT NOON.)  . polycarbophil (FIBERCON) 625 MG tablet Take 1,250 mg by mouth daily at 12 noon.   . polyethylene glycol (MIRALAX / GLYCOLAX) packet Take 17 g by mouth 2 (two) times daily.  . valsartan-hydrochlorothiazide (DIOVAN-HCT) 160-25 MG tablet TAKE 1 TABLET EVERY DAY (Patient taking differently: TAKE 1 TABLET EVERY DAILY AT NOON.)  . venlafaxine XR (EFFEXOR-XR) 75 MG 24 hr capsule TAKE 1 CAPSULE EVERY DAY (Patient taking differently: TAKE 1 CAPSULE EVERY DAILY AT NOON.)  . [DISCONTINUED] amLODipine (NORVASC) 5 MG tablet Take 0.5 tablets (2.5 mg total) by mouth daily.     Allergies:   Sulfa antibiotics   Social History   Social History  . Marital status: Married    Spouse name: N/A  . Number of children: N/A  . Years of education: N/A   Occupational History  . retired, has degree in physiology    Social History Main Topics  . Smoking status: Former Smoker    Quit date: 12/29/1968  . Smokeless tobacco: Never Used  . Alcohol use 7.2 oz/week    12 Cans of beer per week     Comment: occasiona  . Drug use: No  . Sexual activity: Yes   Other Topics Concern  . None   Social History Narrative   Pt lives in [place] w/ wife.     Family History:  The patient's family history includes Breast cancer in his mother.   ROS:   Please see the history of present illness.    Review of Systems  Constitution: Negative.  HENT: Negative.   Cardiovascular: Positive for irregular heartbeat.  Respiratory: Negative.   Endocrine: Negative.   Hematologic/Lymphatic: Negative.   Musculoskeletal: Positive for arthritis, joint pain, joint swelling, myalgias and stiffness.  Gastrointestinal: Negative.   Genitourinary: Negative.   Neurological: Negative.    All other systems reviewed and are negative.   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  The ekg ordered today demonstrates Atrial fibrillation with controlled ventricular rate, poor R wave progression, no acute change  Recent Labs: 10/27/2016: ALT 29; TSH 1.980 10/06/2017: BUN 14; Creatinine, Ser 0.60; Hemoglobin 12.3; Platelets 241; Potassium 3.9; Sodium 136   Lipid Panel    Component Value Date/Time   CHOL 192 10/11/2016 0337   TRIG 471 (H) 10/11/2016 0337   HDL 51 10/11/2016 0337   CHOLHDL 3.8 10/11/2016 0337   VLDL UNABLE TO CALCULATE IF TRIGLYCERIDE OVER 400 mg/dL 10/11/2016 0337   LDLCALC UNABLE TO CALCULATE IF TRIGLYCERIDE OVER 400 mg/dL 10/11/2016 2725    Additional studies/ records that were reviewed today include:    2D Echo 10/05/17   Study Conclusions   - Left ventricle: The cavity size was normal. Wall thickness was   increased in a pattern of mild LVH. Systolic function was normal.   The estimated ejection fraction was in the range of 55% to 60%.   Wall motion was normal; there were no regional wall motion   abnormalities. - Left atrium: The atrium was mildly dilated.   Impressions:   - Normal LV function; mild LVH; mild LAE.   Patient Profile     ELO MARMOLEJOS is a 71 y.o. 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 He had a mechanical fall 09/2016 resulting in  hip fracture, treated with surgery with left femoral nail. Unfortunately, he has had significant pain from the nail and came in for elective removal. During surgery, pt was noted to go into afib w/ RVR.    ASSESSMENT:    1. Atrial fibrillation with controlled ventricular rate ([place])   2. Essential (primary) hypertension      PLAN:  In order of problems listed above:  Atrial fibrillation in the setting of femoral nail removal new for patient started on metoprolol and Eliquis 5 mg BID. Plan was for cardioversion in 4 weeks the patient is having total knee replacement November 26. Discuss with Dr. Johnsie Cancel who concurs that we should wait to do cardioversion until after his knee replacement. He will have to hold his blood thinner for 1-2 days prior to knee replacement and then resume it after surgery. We'll plan on cardioverting him one month after his blood thinner is removed and uninterrupted. Follow-up with Dr. [name] at the end of December to schedule cardioversion.  According to the Revised Cardiac Risk Index (RCRI), his Perioperative Risk of Major Cardiac Event is (%): 0.4  His Functional Capacity in METs is: 6.27 according to the [facility] Activity Status Index (DASI).    Essential hypertension blood pressure low. Stop Norvasc. Can also decrease Diovan if needed    Medication Adjustments/Labs and Tests Ordered: Current medicines are reviewed at length with the patient today.  Concerns regarding medicines are outlined above.  Medication changes, Labs and Tests ordered today are listed in the Patient Instructions below. There are no Patient Instructions on file for this visit.   Sumner Boast, PA-C  10/13/2017 9:46 AM    [facility] [place], [place], [place]  [address] Phone: [phone]; Fax: [phone]

## 2017-10-13 ENCOUNTER — Ambulatory Visit (INDEPENDENT_AMBULATORY_CARE_PROVIDER_SITE_OTHER): Payer: Medicare HMO | Admitting: Physician Assistant

## 2017-10-13 ENCOUNTER — Encounter: Payer: Self-pay | Admitting: Physician Assistant

## 2017-10-13 VITALS — BP 92/72 | HR 79 | Resp 16 | Ht 71.0 in | Wt 221.4 lb

## 2017-10-13 DIAGNOSIS — I4891 Unspecified atrial fibrillation: Secondary | ICD-10-CM

## 2017-10-13 DIAGNOSIS — I10 Essential (primary) hypertension: Secondary | ICD-10-CM

## 2017-10-13 NOTE — Patient Instructions (Addendum)
Your physician has recommended you make the following change in your medication:  1.) STOP AMLODIPINE (NORVASC)  (blood pressure)  Your physician recommends that you schedule a follow-up appointment in: January 2019 with Dr. [name].

## 2017-10-23 ENCOUNTER — Other Ambulatory Visit: Payer: Self-pay | Admitting: *Deleted

## 2017-10-23 MED ORDER — APIXABAN 5 MG PO TABS: 5.0000 mg | ORAL_TABLET | Freq: Two times a day (BID) | ORAL | 1 refills | Status: DC

## 2017-10-23 NOTE — Telephone Encounter (Signed)
Age 71 Wt 100.4kg  10/13/2017 Saw Ermalinda Barrios on 10/13/2017  10/06/2017 SrCr 0.60 10/06/2017 Hgb 12.3 HCT 35.7 Refill done for Eliquis 5mg  q 12 hours as requested

## 2017-10-23 NOTE — Telephone Encounter (Signed)
Patients wife called and requested a ninety day rx be sent to [facility].

## 2017-10-29 ENCOUNTER — Other Ambulatory Visit: Payer: Self-pay | Admitting: Cardiology

## 2017-10-29 MED ORDER — METOPROLOL TARTRATE 50 MG PO TABS: 50.0000 mg | ORAL_TABLET | Freq: Two times a day (BID) | ORAL | 3 refills | Status: DC

## 2017-10-29 MED ORDER — METOPROLOL TARTRATE 50 MG PO TABS: 50.0000 mg | ORAL_TABLET | Freq: Two times a day (BID) | ORAL | 0 refills | Status: DC

## 2017-10-29 NOTE — Telephone Encounter (Signed)
Pt's medication was sent to pt's pharmacy as requested. A 10 day supply was also sent in until pt's mail order arrives. Confirmation received.

## 2017-11-12 ENCOUNTER — Telehealth: Payer: Self-pay

## 2017-11-12 NOTE — Telephone Encounter (Signed)
   Reviewed chart as part of pre-op protocol. Patient was seen by Estella Husk on 10/13/2017. Case was discussed with Dr. Johnsie Cancel at that time with recommendation to proceed with knee surgery and plan for DCCV after his knee surgery and when he has been on uninterrupted, full-dose anticoagulation for at least 3 weeks prior. I will forward to pharmacy for review of anticoagulation.

## 2017-11-12 NOTE — Telephone Encounter (Signed)
Patient with diagnosis of Atrial fibrillation on Eliquis for anticoagulation.    Procedure: TKA Date of procedure: Nov/26/2018  CHADS2-VASc score of  2 (HTN, AGE)  Per office protocol, patient can hold Eliquis for 2 days prior to procedure.  Patient will not need bridging with Lovenox (enoxaparin) around procedure. Should restart Eliquis day after procedure, at discretion of procedure MD   Keyani Rigdon Rodriguez-Guzman PharmD, BCPS, [place] [address] [place] [address] 11/12/2017 2:28 PM

## 2017-11-12 NOTE — Telephone Encounter (Signed)
   [facility] Pre-operative Risk Assessment    Request for surgical clearance:  1. What type of surgery is being performed? Left TKA MEDIAL & LATERAL W/WO PATELLA RESURFACING.  2. When is this surgery scheduled? 11/23/2017  3. Are there any medications that need to be held prior to surgery and how long? Please let us know if there are any contraindications or any recommendations for the patient prior to surgery, during the procedure, or in the post-operative period.  4. Practice name and name of physician performing surgery? [facility].Dr. [name].  5. What is your office phone and fax number? Phone: [phone]. Fax: [phone]. ATTN: [email or web address]  6. Anesthesia type (None, local, MAC, general) ? Not specified.   Stephannie Peters 11/12/2017, 10:53 AM  _________________________________________________________________   (provider comments below)

## 2017-11-12 NOTE — Telephone Encounter (Signed)
   Chart reviewed as part of pre-operative protocol coverage. Given past medical history and time since last visit, based on ACC/AHA guidelines, KAYLOR SIMENSON would be at acceptable risk for the planned procedure without further cardiovascular testing. Patient has previously been cleared for surgery on 10/13/2017. His chart has been reviewed by pharmacy with recommendation to hold Eliquis 2 days prior to procedure. He will not need bridging with Lovenox around the procedure. He should restart Eliquis the day after procedure, at the discretion of procedure MD. He will need to defer cardioversion until he has been on uninterrupted anticoagulation for at least 3 weeks.   I will route this recommendation to the requesting party via Epic fax function and remove from pre-op pool.  Please call with questions.  Christell Faith, PA-C 11/12/2017, 4:13 PM

## 2017-11-16 NOTE — Patient Instructions (Addendum)
Eugene White  11/16/2017   Your procedure is scheduled on: 11-23-17   Report to [facility] Main  Entrance Report to Admitting at 7:00 AM   Call this number if you have problems the morning of surgery [phone]   Remember: ONLY 1 PERSON MAY GO WITH YOU TO SHORT STAY TO GET  READY MORNING OF YOUR SURGERY.  Do not eat food or drink liquids :After Midnight.     Take these medicines the morning of surgery with A SIP OF WATER: Metoprolol (Lopressor)                                You may not have any metal on your body including hair pins and              piercings  Do not wear jewelry, lotions, powders, or deodorant             Men may shave face and neck.   Do not bring valuables to the hospital. [place].  Contacts, dentures or bridgework may not be worn into surgery.  Leave suitcase in the car. After surgery it may be brought to your room.                Please read over the following fact sheets you were given: _____________________________________________________________________             [facility] - Preparing for Surgery Before surgery, you can play an important role.  Because skin is not sterile, your skin needs to be as free of germs as possible.  You can reduce the number of germs on your skin by washing with CHG (chlorahexidine gluconate) soap before surgery.  CHG is an antiseptic cleaner which kills germs and bonds with the skin to continue killing germs even after washing. Please DO NOT use if you have an allergy to CHG or antibacterial soaps.  If your skin becomes reddened/irritated stop using the CHG and inform your nurse when you arrive at Short Stay. Do not shave (including legs and underarms) for at least 48 hours prior to the first CHG shower.  You may shave your face/neck. Please follow these instructions carefully:  1.  Shower with CHG Soap the night before surgery and the   morning of Surgery.  2.  If you choose to wash your hair, wash your hair first as usual with your  normal  shampoo.  3.  After you shampoo, rinse your hair and body thoroughly to remove the  shampoo.                           4.  Use CHG as you would any other liquid soap.  You can apply chg directly  to the skin and wash                       Gently with a scrungie or clean washcloth.  5.  Apply the CHG Soap to your body ONLY FROM THE NECK DOWN.   Do not use on face/ open  Wound or open sores. Avoid contact with eyes, ears mouth and genitals (private parts).                       Wash face,  Genitals (private parts) with your normal soap.             6.  Wash thoroughly, paying special attention to the area where your surgery  will be performed.  7.  Thoroughly rinse your body with warm water from the neck down.  8.  DO NOT shower/wash with your normal soap after using and rinsing off  the CHG Soap.                9.  Pat yourself dry with a clean towel.            10.  Wear clean pajamas.            11.  Place clean sheets on your bed the night of your first shower and do not  sleep with pets. Day of Surgery : Do not apply any lotions/deodorants the morning of surgery.  Please wear clean clothes to the hospital/surgery center.  FAILURE TO FOLLOW THESE INSTRUCTIONS MAY RESULT IN THE CANCELLATION OF YOUR SURGERY PATIENT SIGNATURE_________________________________  NURSE SIGNATURE__________________________________  ________________________________________________________________________   Adam Phenix  An incentive spirometer is a tool that can help keep your lungs clear and active. This tool measures how well you are filling your lungs with each breath. Taking long deep breaths may help reverse or decrease the chance of developing breathing (pulmonary) problems (especially infection) following:  A long period of time when you are unable to move or be  active. BEFORE THE PROCEDURE   If the spirometer includes an indicator to show your best effort, your nurse or respiratory therapist will set it to a desired goal.  If possible, sit up straight or lean slightly forward. Try not to slouch.  Hold the incentive spirometer in an upright position. INSTRUCTIONS FOR USE  1. Sit on the edge of your bed if possible, or sit up as far as you can in bed or on a chair. 2. Hold the incentive spirometer in an upright position. 3. Breathe out normally. 4. Place the mouthpiece in your mouth and seal your lips tightly around it. 5. Breathe in slowly and as deeply as possible, raising the piston or the ball toward the top of the column. 6. Hold your breath for 3-5 seconds or for as long as possible. Allow the piston or ball to fall to the bottom of the column. 7. Remove the mouthpiece from your mouth and breathe out normally. 8. Rest for a few seconds and repeat Steps 1 through 7 at least 10 times every 1-2 hours when you are awake. Take your time and take a few normal breaths between deep breaths. 9. The spirometer may include an indicator to show your best effort. Use the indicator as a goal to work toward during each repetition. 10. After each set of 10 deep breaths, practice coughing to be sure your lungs are clear. If you have an incision (the cut made at the time of surgery), support your incision when coughing by placing a pillow or rolled up towels firmly against it. Once you are able to get out of bed, walk around indoors and cough well. You may stop using the incentive spirometer when instructed by your caregiver.  RISKS AND COMPLICATIONS  Take your time so you do not get  dizzy or light-headed.  If you are in pain, you may need to take or ask for pain medication before doing incentive spirometry. It is harder to take a deep breath if you are having pain. AFTER USE  Rest and breathe slowly and easily.  It can be helpful to keep track of a log of  your progress. Your caregiver can provide you with a simple table to help with this. If you are using the spirometer at home, follow these instructions: [place] IF:   You are having difficultly using the spirometer.  You have trouble using the spirometer as often as instructed.  Your pain medication is not giving enough relief while using the spirometer.  You develop fever of 100.5 F (38.1 C) or higher. SEEK IMMEDIATE MEDICAL CARE IF:   You cough up bloody sputum that had not been present before.  You develop fever of 102 F (38.9 C) or greater.  You develop worsening pain at or near the incision site. MAKE SURE YOU:   Understand these instructions.  Will watch your condition.  Will get help right away if you are not doing well or get worse. Document Released: 04/27/2007 Document Revised: 03/08/2012 Document Reviewed: 06/28/2007 Exit[employer] Patient Information 2014 [employer], [place].   ________________________________________________________________________  WHAT IS A BLOOD TRANSFUSION? Blood Transfusion Information  A transfusion is the replacement of blood or some of its parts. Blood is made up of multiple cells which provide different functions.  Red blood cells carry oxygen and are used for blood loss replacement.  White blood cells fight against infection.  Platelets control bleeding.  Plasma helps clot blood.  Other blood products are available for specialized needs, such as hemophilia or other clotting disorders. BEFORE THE TRANSFUSION  Who gives blood for transfusions?   Healthy volunteers who are fully evaluated to make sure their blood is safe. This is blood bank blood. Transfusion therapy is the safest it has ever been in the practice of medicine. Before blood is taken from a donor, a complete history is taken to make sure that person has no history of diseases nor engages in risky social behavior (examples are intravenous drug use or sexual activity  with multiple partners). The donor's travel history is screened to minimize risk of transmitting infections, such as malaria. The donated blood is tested for signs of infectious diseases, such as HIV and hepatitis. The blood is then tested to be sure it is compatible with you in order to minimize the chance of a transfusion reaction. If you or a relative donates blood, this is often done in anticipation of surgery and is not appropriate for emergency situations. It takes many days to process the donated blood. RISKS AND COMPLICATIONS Although transfusion therapy is very safe and saves many lives, the main dangers of transfusion include:   Getting an infectious disease.  Developing a transfusion reaction. This is an allergic reaction to something in the blood you were given. Every precaution is taken to prevent this. The decision to have a blood transfusion has been considered carefully by your caregiver before blood is given. Blood is not given unless the benefits outweigh the risks. AFTER THE TRANSFUSION  Right after receiving a blood transfusion, you will usually feel much better and more energetic. This is especially true if your red blood cells have gotten low (anemic). The transfusion raises the level of the red blood cells which carry oxygen, and this usually causes an energy increase.  The nurse administering the transfusion will  monitor you carefully for complications. HOME CARE INSTRUCTIONS  No special instructions are needed after a transfusion. You may find your energy is better. Speak with your caregiver about any limitations on activity for underlying diseases you may have. SEEK MEDICAL CARE IF:   Your condition is not improving after your transfusion.  You develop redness or irritation at the intravenous (IV) site. SEEK IMMEDIATE MEDICAL CARE IF:  Any of the following symptoms occur over the next 12 hours:  Shaking chills.  You have a temperature by mouth above 102 F (38.9  C), not controlled by medicine.  Chest, back, or muscle pain.  People around you feel you are not acting correctly or are confused.  Shortness of breath or difficulty breathing.  Dizziness and fainting.  You get a rash or develop hives.  You have a decrease in urine output.  Your urine turns a dark color or changes to pink, red, or brown. Any of the following symptoms occur over the next 10 days:  You have a temperature by mouth above 102 F (38.9 C), not controlled by medicine.  Shortness of breath.  Weakness after normal activity.  The white part of the eye turns yellow (jaundice).  You have a decrease in the amount of urine or are urinating less often.  Your urine turns a dark color or changes to pink, red, or brown. Document Released: 12/12/2000 Document Revised: 03/08/2012 Document Reviewed: 07/31/2008 [facility] Patient Information 2014 [place], [place].  _______________________________________________________________________

## 2017-11-16 NOTE — Progress Notes (Signed)
11-12-17 (Epic) Telephone encounter-Cardiac clearance per Christell Faith, PAC  10-13-17 (Epic) EKG  10-05-17 (Epic) ECHO

## 2017-11-17 ENCOUNTER — Encounter (HOSPITAL_COMMUNITY)
Admission: RE | Admit: 2017-11-17 | Discharge: 2017-11-17 | Disposition: A | Discharge status: Home / Self Care | Payer: Medicare HMO | Source: Ambulatory Visit | Attending: Orthopedic Surgery | Admitting: Orthopedic Surgery

## 2017-11-17 ENCOUNTER — Encounter (HOSPITAL_COMMUNITY): Payer: Self-pay

## 2017-11-17 ENCOUNTER — Other Ambulatory Visit: Payer: Self-pay

## 2017-11-17 DIAGNOSIS — Z01812 Encounter for preprocedural laboratory examination: Secondary | ICD-10-CM | POA: Insufficient documentation

## 2017-11-17 DIAGNOSIS — M1712 Unilateral primary osteoarthritis, left knee: Secondary | ICD-10-CM | POA: Diagnosis not present

## 2017-11-17 LAB — SURGICAL PCR SCREEN
MRSA, PCR: NEGATIVE
Staphylococcus aureus: NEGATIVE

## 2017-11-17 LAB — BASIC METABOLIC PANEL
ANION GAP: 7 (ref 5–15)
BUN: 15 mg/dL (ref 6–20)
CHLORIDE: 101 mmol/L (ref 101–111)
CO2: 29 mmol/L (ref 22–32)
CREATININE: 0.74 mg/dL (ref 0.61–1.24)
Calcium: 9.3 mg/dL (ref 8.9–10.3)
GFR calc non Af Amer: >60 mL/min (ref >60)
Glucose, Bld: 97 mg/dL (ref 65–99)
Potassium: 3.9 mmol/L (ref 3.5–5.1)
Sodium: 137 mmol/L (ref 135–145)

## 2017-11-17 LAB — CBC
HEMATOCRIT: 45.1 % (ref 39.0–52.0)
HEMOGLOBIN: 15.3 g/dL (ref 13.0–17.0)
MCH: 35.2 pg — AB (ref 26.0–34.0)
MCHC: 33.9 g/dL (ref 30.0–36.0)
MCV: 103.7 fL — AB (ref 78.0–100.0)
Platelets: 235 10*3/uL (ref 150–400)
RBC: 4.35 MIL/uL (ref 4.22–5.81)
RDW: 13.7 % (ref 11.5–15.5)
WBC: 6.9 10*3/uL (ref 4.0–10.5)

## 2017-11-18 NOTE — H&P (Signed)
TOTAL KNEE ADMISSION H&P  Patient is being admitted for left total knee arthroplasty.  Subjective:  Chief Complaint:   Left knee primary OA / pain  HPI: Eugene White, 71 y.o. male, has a history of pain and functional disability in the left knee due to arthritis and has failed non-surgical conservative treatments for greater than 12 weeks to includeNSAID's and/or analgesics, corticosteriod injections and activity modification.  Onset of symptoms was gradual, starting 3+ years ago with gradually worsening course since that time. The patient noted prior procedures on the knee to include  arthroplasty on the right knee per Dr. [name] in January 2016.  Patient currently rates pain in the left knee(s) at 7 out of 10 with activity. Patient has night pain, worsening of pain with activity and weight bearing, pain that interferes with activities of daily living, pain with passive range of motion, crepitus and joint swelling.  Patient has evidence of periarticular osteophytes and joint space narrowing by imaging studies.  There is no active infection.  Risks, benefits and expectations were discussed with the patient.  Risks including but not limited to the risk of anesthesia, blood clots, nerve damage, blood vessel damage, failure of the prosthesis, infection and up to and including death.  Patient understand the risks, benefits and expectations and wishes to proceed with surgery.   PCP: [name], PA  D/C Plans:       Home   Post-op Meds:       No Rx given  Tranexamic Acid:      To be given - IV   Decadron:      Is to be given  FYI:     Eliquis ( on prior to surgery)  Norco  DME:   Pt already has equipment   PT:   OPPT Rx given    Patient Active Problem List   Diagnosis Date Noted  . History of removal of retained hardware 10/06/2017  . Left femur IM nail removal 10/05/2017  . Atrial fibrillation with controlled ventricular rate ([place]) 10/05/2017  . Closed intertrochanteric fracture  of hip, left, initial encounter ([place]) 10/10/2016  . Esophagitis, reflux 06/27/2015  . Contracture of palmar fascia (Dupuytren's) 06/27/2015  . Arthritis of knee 06/27/2015  . Arthropathia 06/27/2015  . Dupuytren's disease of palm 06/27/2015  . S/P right TKA 01/09/2015  . DJD (degenerative joint disease) of knee 01/09/2015  . Arthritis of knee, degenerative 01/09/2015  . History of artificial joint 01/09/2015  . Fever, unspecified 06/02/2014  . Loss of weight 06/02/2014  . Leukocytosis, unspecified 06/02/2014  . Abdominal pain, unspecified site 06/02/2014  . Anemia 06/02/2014  . Hypercholesterolemia without hypertriglyceridemia 05/31/2009  . 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 Benign essential HTN 05/31/2009  . Colon, diverticulosis 05/31/2009  . Clinical depression 05/31/2009  . Diverticulitis large intestine 05/31/2009  . Essential (primary) hypertension 05/31/2009  . Major depressive disorder with single episode 05/31/2009  . Pure hypercholesterolemia 05/31/2009   Past Medical History:  Diagnosis Date  . Anemia    hx of one time  . Arthritis    OA  . Atrial fibrillation with controlled ventricular rate ([place]) 10/05/2017  . Difficulty sleeping   . Diverticulosis 2014  . 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 GERD (gastroesophageal reflux disease)   . GI symptom    had nausea / vomited once / frequent stools / getting better  . Hyperlipidemia   . Hypertension   . Mood changes   .  Pneumonia    hx of  . Thyroid disease    hypo    Past Surgical History:  Procedure Laterality Date  . COLONOSCOPY  2014  . FEMUR IM NAIL Left 10/11/2016   Procedure: INTRAMEDULLARY (IM) NAIL FEMORAL;  Surgeon: [name], MD;  Location: [facility] ORS;  Service: Orthopedics;  Laterality: Left;  . HAND SURGERY   1973 / 2010 / 2015   right to release tendons  . HARDWARE REMOVAL Left 10/05/2017   Procedure: Removal of left femoral nail;  Surgeon: [name], MD;   Location: [facility] ORS;  Service: Orthopedics;  Laterality: Left;  90 mins  . KNEE ARTHROSCOPY  1984 & 2001   twice  . NASAL SEPTUM SURGERY    . removal Left femoral nail      10/05/17 Dr. [name]  . [place] & 2006   Right and left  . TONSILLECTOMY    . TOTAL KNEE ARTHROPLASTY Right 01/09/2015   Procedure: RIGHT TOTAL KNEE ARTHROPLASTY;  Surgeon: [name], MD;  Location: [facility] ORS;  Service: Orthopedics;  Laterality: Right;    No current facility-administered medications for this encounter.    Current Outpatient Medications  Medication Sig Dispense Refill Last Dose  . apixaban (ELIQUIS) 5 MG TABS tablet Take 1 tablet (5 mg total) by mouth 2 (two) times daily. (Patient taking differently: Take 5 mg by mouth 2 (two) times daily. ) 180 tablet 1   . atorvastatin (LIPITOR) 20 MG tablet TAKE 1 TABLET EVERY DAY (Patient taking differently: TAKE 1 TABLET EVERY DAILY AT NOON.) 90 tablet 4 Taking  . Cholecalciferol (VITAMIN D) 2000 UNITS CAPS Take 2,000 Units by mouth daily at 12 noon.    Taking  . ferrous sulfate (FERROUSUL) 325 (65 FE) MG tablet Take 1 tablet (325 mg total) by mouth 3 (three) times daily with meals. (Patient taking differently: Take 325 mg by mouth 2 (two) times daily with a meal. )  3 Taking  . HYDROcodone-acetaminophen (NORCO) 7.5-325 MG tablet Take 1-2 tablets by mouth every 4 (four) hours as needed for moderate pain or severe pain. 60 tablet 0 Taking  . levothyroxine (SYNTHROID, LEVOTHROID) 150 MCG tablet TAKE 1 TABLET EVERY DAY (Patient taking differently: TAKE 1 TABLET EVERY DAILY AT NOON.) 90 tablet 4 Taking  . metoprolol tartrate (LOPRESSOR) 50 MG tablet Take 1 tablet (50 mg total) by mouth 2 (two) times daily. 20 tablet 0   . Multiple Vitamin (MULTIVITAMIN WITH MINERALS) TABS tablet Take 1 tablet by mouth daily at 12 noon.    Taking  . Multiple Vitamins-Minerals (PRESERVISION AREDS 2 PO) Take 2 tablets by mouth daily at 12 noon.   Taking  .  pantoprazole (PROTONIX) 40 MG tablet TAKE 1 TABLET EVERY DAY (Patient taking differently: TAKE 1 TABLET EVERY DAILY AT NOON.) 90 tablet 4 Taking  . polycarbophil (FIBERCON) 625 MG tablet Take 1,250 mg by mouth daily at 12 noon.    Taking  . valsartan-hydrochlorothiazide (DIOVAN-HCT) 160-25 MG tablet TAKE 1 TABLET EVERY DAY (Patient taking differently: TAKE 1 TABLET EVERY DAILY AT NOON.) 90 tablet 3 Taking  . venlafaxine XR (EFFEXOR-XR) 75 MG 24 hr capsule TAKE 1 CAPSULE EVERY DAY (Patient taking differently: TAKE 1 CAPSULE EVERY DAILY AT NOON.) 90 capsule 3 Taking  . docusate sodium (COLACE) 100 MG capsule Take 1 capsule (100 mg total) by mouth 2 (two) times daily. (Patient not taking: Reported on 11/10/2017) 10 capsule 0 Not Taking at Unknown time  .  methocarbamol (ROBAXIN) 500 MG tablet Take 1 tablet (500 mg total) by mouth every 6 (six) hours as needed for muscle spasms. (Patient not taking: Reported on 11/10/2017) 40 tablet 0 Not Taking at Unknown time  . polyethylene glycol (MIRALAX / GLYCOLAX) packet Take 17 g by mouth 2 (two) times daily. (Patient not taking: Reported on 11/10/2017) 14 each 0 Not Taking at Unknown time   Allergies  Allergen Reactions  . Sulfa Antibiotics Other (See Comments)    unknown    Social History   Tobacco Use  . Smoking status: Former Smoker    Last attempt to quit: 12/29/1968    Years since quitting: 48.9  . Smokeless tobacco: Never Used  Substance Use Topics  . Alcohol use: Yes    Alcohol/week: 7.2 oz    Types: 12 Cans of beer per week    Comment: occasiona    Family History  Problem Relation Age of Onset  . Breast cancer Mother   . Colon cancer Neg Hx   . Esophageal cancer Neg Hx   . Stomach cancer Neg Hx   . Rectal cancer Neg Hx      Review of Systems  Constitutional: Negative.   HENT: Positive for tinnitus.   Eyes: Negative.   Respiratory: Negative.   Cardiovascular: Negative.   Gastrointestinal: Negative.   Genitourinary: Negative.    Musculoskeletal: Positive for joint pain.  Skin: Negative.   Neurological: Negative.   Endo/Heme/Allergies: Negative.   Psychiatric/Behavioral: Negative.     Objective:  Physical Exam  Constitutional: He is oriented to person, place, and time. He appears well-developed.  HENT:  Head: Normocephalic.  Eyes: Pupils are equal, round, and reactive to light.  Neck: Neck supple. No JVD present. No tracheal deviation present. No thyromegaly present.  Cardiovascular: Normal rate, regular rhythm and intact distal pulses.  Respiratory: Effort normal and breath sounds normal. No respiratory distress. He has no wheezes.  GI: Soft. There is no tenderness. There is no guarding.  Musculoskeletal:       Left knee: He exhibits decreased range of motion, swelling and bony tenderness. He exhibits no ecchymosis, no deformity, no laceration and no erythema. Tenderness found.  Lymphadenopathy:    He has no cervical adenopathy.  Neurological: He is alert and oriented to person, place, and time.  Skin: Skin is warm and dry.  Psychiatric: He has a normal mood and affect.      Labs:  Estimated body mass index is 30.82 kg/m as calculated from the following:   Height as of 11/17/17: 5\' 11"  (1.803 m).   Weight as of 11/17/17: 100.2 kg (221 lb).   Imaging Review Plain radiographs demonstrate severe degenerative joint disease of the left knee(s).  The bone quality appears to be good for age and reported activity level.  Assessment/Plan:  End stage arthritis, left knee   The patient history, physical examination, clinical judgment of the provider and imaging studies are consistent with end stage degenerative joint disease of the left knee(s) and total knee arthroplasty is deemed medically necessary. The treatment options including medical management, injection therapy arthroscopy and arthroplasty were discussed at length. The risks and benefits of total knee arthroplasty were presented and reviewed. The  risks due to aseptic loosening, infection, stiffness, patella tracking problems, thromboembolic complications and other imponderables were discussed. The patient acknowledged the explanation, agreed to proceed with the plan and consent was signed. Patient is being admitted for inpatient treatment for surgery, pain control, PT, OT, prophylactic antibiotics, VTE prophylaxis,  progressive ambulation and ADL's and discharge planning. The patient is planning to be discharged home.     West Pugh Monterio Bob   PA-C  11/18/2017, 9:35 PM

## 2017-11-23 ENCOUNTER — Encounter (HOSPITAL_COMMUNITY): Payer: Self-pay

## 2017-11-23 ENCOUNTER — Inpatient Hospital Stay (HOSPITAL_COMMUNITY): Payer: Medicare HMO | Admitting: Registered Nurse

## 2017-11-23 ENCOUNTER — Encounter (HOSPITAL_COMMUNITY)
Admission: RE | Disposition: A | Discharge status: Home / Self Care | Payer: Self-pay | Source: Ambulatory Visit | Attending: Orthopedic Surgery

## 2017-11-23 ENCOUNTER — Other Ambulatory Visit: Payer: Self-pay

## 2017-11-23 ENCOUNTER — Observation Stay (HOSPITAL_COMMUNITY)
Admission: RE | Admit: 2017-11-23 | Discharge: 2017-11-24 | Disposition: A | Discharge status: Home / Self Care | Payer: Medicare HMO | Source: Ambulatory Visit | Attending: Orthopedic Surgery | Admitting: Orthopedic Surgery

## 2017-11-23 DIAGNOSIS — K219 Gastro-esophageal reflux disease without esophagitis: Secondary | ICD-10-CM | POA: Insufficient documentation

## 2017-11-23 DIAGNOSIS — G8918 Other acute postprocedural pain: Secondary | ICD-10-CM | POA: Diagnosis not present

## 2017-11-23 DIAGNOSIS — Z96651 Presence of right artificial knee joint: Secondary | ICD-10-CM | POA: Insufficient documentation

## 2017-11-23 DIAGNOSIS — M1990 Unspecified osteoarthritis, unspecified site: Secondary | ICD-10-CM | POA: Insufficient documentation

## 2017-11-23 DIAGNOSIS — Z96659 Presence of unspecified artificial knee joint: Secondary | ICD-10-CM

## 2017-11-23 DIAGNOSIS — Z79899 Other long term (current) drug therapy: Secondary | ICD-10-CM | POA: Insufficient documentation

## 2017-11-23 DIAGNOSIS — Z803 Family history of malignant neoplasm of breast: Secondary | ICD-10-CM | POA: Insufficient documentation

## 2017-11-23 DIAGNOSIS — Z87891 Personal history of nicotine dependence: Secondary | ICD-10-CM | POA: Diagnosis not present

## 2017-11-23 DIAGNOSIS — I10 Essential (primary) hypertension: Secondary | ICD-10-CM | POA: Diagnosis not present

## 2017-11-23 DIAGNOSIS — M25562 Pain in left knee: Secondary | ICD-10-CM | POA: Diagnosis not present

## 2017-11-23 DIAGNOSIS — Z7901 Long term (current) use of anticoagulants: Secondary | ICD-10-CM | POA: Diagnosis not present

## 2017-11-23 DIAGNOSIS — M1712 Unilateral primary osteoarthritis, left knee: Principal | ICD-10-CM | POA: Insufficient documentation

## 2017-11-23 DIAGNOSIS — I4891 Unspecified atrial fibrillation: Secondary | ICD-10-CM | POA: Diagnosis not present

## 2017-11-23 DIAGNOSIS — F329 Major depressive disorder, single episode, unspecified: Secondary | ICD-10-CM | POA: Diagnosis not present

## 2017-11-23 DIAGNOSIS — E039 Hypothyroidism, unspecified: Secondary | ICD-10-CM | POA: Diagnosis not present

## 2017-11-23 DIAGNOSIS — E7800 Pure hypercholesterolemia, unspecified: Secondary | ICD-10-CM | POA: Insufficient documentation

## 2017-11-23 DIAGNOSIS — E785 Hyperlipidemia, unspecified: Secondary | ICD-10-CM | POA: Insufficient documentation

## 2017-11-23 DIAGNOSIS — Z96652 Presence of left artificial knee joint: Secondary | ICD-10-CM

## 2017-11-23 HISTORY — PX: TOTAL KNEE ARTHROPLASTY: SHX125

## 2017-11-23 LAB — TYPE AND SCREEN
ABO/RH(D): O POS
Antibody Screen: NEGATIVE

## 2017-11-23 SURGERY — ARTHROPLASTY, KNEE, TOTAL
Anesthesia: Spinal | Site: Knee | Laterality: Left

## 2017-11-23 MED ORDER — POLYETHYLENE GLYCOL 3350 17 G PO PACK
17.0000 g | PACK | Freq: Two times a day (BID) | ORAL | Status: DC
  Administered 2017-11-23 – 2017-11-24 (×2): 17 g via ORAL
  Filled 2017-11-23 (×2): qty 1

## 2017-11-23 MED ORDER — HYDROCHLOROTHIAZIDE 25 MG PO TABS
25.0000 mg | ORAL_TABLET | Freq: Every day | ORAL | Status: DC
  Administered 2017-11-23 – 2017-11-24 (×2): 25 mg via ORAL
  Filled 2017-11-23 (×2): qty 1

## 2017-11-23 MED ORDER — DIPHENHYDRAMINE HCL 12.5 MG/5ML PO ELIX: 12.5000 mg | ORAL_SOLUTION | ORAL | Status: DC | PRN

## 2017-11-23 MED ORDER — ATORVASTATIN CALCIUM 20 MG PO TABS
20.0000 mg | ORAL_TABLET | Freq: Every day | ORAL | Status: DC
  Administered 2017-11-23 – 2017-11-24 (×2): 20 mg via ORAL
  Filled 2017-11-23 (×2): qty 1

## 2017-11-23 MED ORDER — BUPIVACAINE HCL (PF) 0.75 % IJ SOLN
INTRAMUSCULAR | Status: DC | PRN
  Administered 2017-11-23: 2 mL via INTRATHECAL

## 2017-11-23 MED ORDER — HYDROMORPHONE HCL 1 MG/ML IJ SOLN: 0.5000 mg | INTRAMUSCULAR | Status: DC | PRN

## 2017-11-23 MED ORDER — CEFAZOLIN SODIUM-DEXTROSE 2-4 GM/100ML-% IV SOLN
2.0000 g | INTRAVENOUS | Status: AC
  Administered 2017-11-23: 2 g via INTRAVENOUS

## 2017-11-23 MED ORDER — OXYCODONE HCL 5 MG PO TABS: 5.0000 mg | ORAL_TABLET | Freq: Once | ORAL | Status: DC | PRN

## 2017-11-23 MED ORDER — KETOROLAC TROMETHAMINE 30 MG/ML IJ SOLN
INTRAMUSCULAR | Status: DC | PRN
  Administered 2017-11-23: 30 mg

## 2017-11-23 MED ORDER — OXYCODONE HCL 5 MG/5ML PO SOLN: 5.0000 mg | Freq: Once | ORAL | Status: DC | PRN

## 2017-11-23 MED ORDER — APIXABAN 2.5 MG PO TABS
2.5000 mg | ORAL_TABLET | Freq: Two times a day (BID) | ORAL | Status: DC
  Administered 2017-11-24: 2.5 mg via ORAL
  Filled 2017-11-23: qty 1

## 2017-11-23 MED ORDER — ACETAMINOPHEN 325 MG PO TABS: 650.0000 mg | ORAL_TABLET | ORAL | Status: DC | PRN

## 2017-11-23 MED ORDER — EPHEDRINE 5 MG/ML INJ
INTRAVENOUS | Status: AC
  Filled 2017-11-23: qty 10

## 2017-11-23 MED ORDER — DEXAMETHASONE SODIUM PHOSPHATE 10 MG/ML IJ SOLN
INTRAMUSCULAR | Status: AC
  Filled 2017-11-23: qty 1

## 2017-11-23 MED ORDER — KETOROLAC TROMETHAMINE 30 MG/ML IJ SOLN
INTRAMUSCULAR | Status: AC
  Filled 2017-11-23: qty 1

## 2017-11-23 MED ORDER — ONDANSETRON HCL 4 MG/2ML IJ SOLN
INTRAMUSCULAR | Status: DC | PRN
  Administered 2017-11-23: 4 mg via INTRAVENOUS

## 2017-11-23 MED ORDER — ACETAMINOPHEN 650 MG RE SUPP: 650.0000 mg | RECTAL | Status: DC | PRN

## 2017-11-23 MED ORDER — METHOCARBAMOL 500 MG PO TABS
500.0000 mg | ORAL_TABLET | Freq: Four times a day (QID) | ORAL | Status: DC | PRN
  Administered 2017-11-23 – 2017-11-24 (×2): 500 mg via ORAL
  Filled 2017-11-23 (×2): qty 1

## 2017-11-23 MED ORDER — PANTOPRAZOLE SODIUM 40 MG PO TBEC
40.0000 mg | DELAYED_RELEASE_TABLET | Freq: Every day | ORAL | Status: DC
  Administered 2017-11-23 – 2017-11-24 (×2): 40 mg via ORAL
  Filled 2017-11-23 (×2): qty 1

## 2017-11-23 MED ORDER — HYDROMORPHONE HCL 1 MG/ML IJ SOLN
0.2500 mg | INTRAMUSCULAR | Status: DC | PRN
Start: 2017-11-23 — End: 2017-11-23

## 2017-11-23 MED ORDER — DEXAMETHASONE SODIUM PHOSPHATE 10 MG/ML IJ SOLN
10.0000 mg | Freq: Once | INTRAMUSCULAR | Status: AC
  Administered 2017-11-23: 10 mg via INTRAVENOUS

## 2017-11-23 MED ORDER — LEVOTHYROXINE SODIUM 150 MCG PO TABS
150.0000 ug | ORAL_TABLET | Freq: Every day | ORAL | Status: DC
  Administered 2017-11-23 – 2017-11-24 (×2): 150 ug via ORAL
  Filled 2017-11-23: qty 2
  Filled 2017-11-23 (×2): qty 1
  Filled 2017-11-23: qty 2

## 2017-11-23 MED ORDER — PROPOFOL 10 MG/ML IV BOLUS
INTRAVENOUS | Status: DC | PRN
  Administered 2017-11-23: 20 mg via INTRAVENOUS

## 2017-11-23 MED ORDER — ONDANSETRON HCL 4 MG/2ML IJ SOLN: 4.0000 mg | Freq: Four times a day (QID) | INTRAMUSCULAR | Status: DC | PRN

## 2017-11-23 MED ORDER — MIDAZOLAM HCL 2 MG/2ML IJ SOLN
INTRAMUSCULAR | Status: AC
  Filled 2017-11-23: qty 2

## 2017-11-23 MED ORDER — SODIUM CHLORIDE 0.9 % IR SOLN
Status: DC | PRN
  Administered 2017-11-23: 1000 mL

## 2017-11-23 MED ORDER — VENLAFAXINE HCL ER 75 MG PO CP24
75.0000 mg | ORAL_CAPSULE | Freq: Every day | ORAL | Status: DC
  Administered 2017-11-23 – 2017-11-24 (×2): 75 mg via ORAL
  Filled 2017-11-23 (×2): qty 1

## 2017-11-23 MED ORDER — BUPIVACAINE IN DEXTROSE 0.75-8.25 % IT SOLN
INTRATHECAL | Status: DC | PRN
  Administered 2017-11-23: 2 mL via INTRATHECAL

## 2017-11-23 MED ORDER — HYDROCODONE-ACETAMINOPHEN 7.5-325 MG PO TABS
2.0000 | ORAL_TABLET | ORAL | Status: DC | PRN
  Administered 2017-11-23 – 2017-11-24 (×5): 2 via ORAL
  Filled 2017-11-23 (×5): qty 2

## 2017-11-23 MED ORDER — MIDAZOLAM HCL 2 MG/2ML IJ SOLN
2.0000 mg | Freq: Once | INTRAMUSCULAR | Status: AC
  Administered 2017-11-23: 1 mg via INTRAVENOUS

## 2017-11-23 MED ORDER — ROPIVACAINE HCL 7.5 MG/ML IJ SOLN
INTRAMUSCULAR | Status: DC | PRN
  Administered 2017-11-23: 20 mL via PERINEURAL

## 2017-11-23 MED ORDER — CHLORHEXIDINE GLUCONATE 4 % EX LIQD: 60.0000 mL | Freq: Once | CUTANEOUS | Status: DC

## 2017-11-23 MED ORDER — PROPOFOL 10 MG/ML IV BOLUS
INTRAVENOUS | Status: AC
  Filled 2017-11-23: qty 60

## 2017-11-23 MED ORDER — ONDANSETRON HCL 4 MG/2ML IJ SOLN
INTRAMUSCULAR | Status: AC
  Filled 2017-11-23: qty 2

## 2017-11-23 MED ORDER — FENTANYL CITRATE (PF) 100 MCG/2ML IJ SOLN
100.0000 ug | Freq: Once | INTRAMUSCULAR | Status: AC
  Administered 2017-11-23: 100 ug via INTRAVENOUS

## 2017-11-23 MED ORDER — TRANEXAMIC ACID 1000 MG/10ML IV SOLN
1000.0000 mg | Freq: Once | INTRAVENOUS | Status: AC
  Administered 2017-11-23: 14:00:00 1000 mg via INTRAVENOUS
  Filled 2017-11-23: qty 1100

## 2017-11-23 MED ORDER — METOPROLOL TARTRATE 50 MG PO TABS
50.0000 mg | ORAL_TABLET | Freq: Two times a day (BID) | ORAL | Status: DC
  Administered 2017-11-23 – 2017-11-24 (×2): 50 mg via ORAL
  Filled 2017-11-23 (×3): qty 1

## 2017-11-23 MED ORDER — CALCIUM POLYCARBOPHIL 625 MG PO TABS
1250.0000 mg | ORAL_TABLET | Freq: Every day | ORAL | Status: DC
  Administered 2017-11-23 – 2017-11-24 (×2): 1250 mg via ORAL
  Filled 2017-11-23 (×2): qty 2

## 2017-11-23 MED ORDER — SODIUM CHLORIDE 0.9 % IJ SOLN
INTRAMUSCULAR | Status: AC
  Filled 2017-11-23: qty 50

## 2017-11-23 MED ORDER — PROPOFOL 500 MG/50ML IV EMUL
INTRAVENOUS | Status: DC | PRN
  Administered 2017-11-23: 50 ug/kg/min via INTRAVENOUS

## 2017-11-23 MED ORDER — METOCLOPRAMIDE HCL 5 MG/ML IJ SOLN: 5.0000 mg | Freq: Three times a day (TID) | INTRAMUSCULAR | Status: DC | PRN

## 2017-11-23 MED ORDER — VALSARTAN-HYDROCHLOROTHIAZIDE 160-25 MG PO TABS: 1.0000 | ORAL_TABLET | Freq: Every day | ORAL | Status: DC

## 2017-11-23 MED ORDER — PHENOL 1.4 % MT LIQD
1.0000 | OROMUCOSAL | Status: DC | PRN
Start: 2017-11-23 — End: 2017-11-24
  Filled 2017-11-23: qty 177

## 2017-11-23 MED ORDER — HYDROCODONE-ACETAMINOPHEN 7.5-325 MG PO TABS
1.0000 | ORAL_TABLET | ORAL | Status: DC | PRN
  Administered 2017-11-23: 14:00:00 1 via ORAL
  Filled 2017-11-23 (×2): qty 1

## 2017-11-23 MED ORDER — SODIUM CHLORIDE 0.9 % IJ SOLN
INTRAMUSCULAR | Status: DC | PRN
  Administered 2017-11-23: 30 mL

## 2017-11-23 MED ORDER — CEFAZOLIN SODIUM-DEXTROSE 2-4 GM/100ML-% IV SOLN
2.0000 g | Freq: Four times a day (QID) | INTRAVENOUS | Status: AC
  Administered 2017-11-23 (×2): 2 g via INTRAVENOUS
  Filled 2017-11-23 (×2): qty 100

## 2017-11-23 MED ORDER — ONDANSETRON HCL 4 MG PO TABS: 4.0000 mg | ORAL_TABLET | Freq: Four times a day (QID) | ORAL | Status: DC | PRN

## 2017-11-23 MED ORDER — DEXAMETHASONE SODIUM PHOSPHATE 10 MG/ML IJ SOLN
10.0000 mg | Freq: Once | INTRAMUSCULAR | Status: AC
  Administered 2017-11-24: 10 mg via INTRAVENOUS
  Filled 2017-11-23: qty 1

## 2017-11-23 MED ORDER — FERROUS SULFATE 325 (65 FE) MG PO TABS: 325.0000 mg | ORAL_TABLET | Freq: Three times a day (TID) | ORAL | Status: DC

## 2017-11-23 MED ORDER — LACTATED RINGERS IV SOLN
INTRAVENOUS | Status: DC
  Administered 2017-11-23: 10:00:00 via INTRAVENOUS
  Administered 2017-11-23 (×2): 1000 mL via INTRAVENOUS

## 2017-11-23 MED ORDER — TRANEXAMIC ACID 1000 MG/10ML IV SOLN
1000.0000 mg | INTRAVENOUS | Status: AC
  Administered 2017-11-23: 1000 mg via INTRAVENOUS
  Filled 2017-11-23: qty 1100

## 2017-11-23 MED ORDER — METHOCARBAMOL 1000 MG/10ML IJ SOLN
500.0000 mg | Freq: Four times a day (QID) | INTRAVENOUS | Status: DC | PRN
  Administered 2017-11-23: 500 mg via INTRAVENOUS
  Filled 2017-11-23: qty 550

## 2017-11-23 MED ORDER — SODIUM CHLORIDE 0.9 % IV SOLN
INTRAVENOUS | Status: DC
  Administered 2017-11-23: 100 mL/h via INTRAVENOUS
  Administered 2017-11-24: via INTRAVENOUS

## 2017-11-23 MED ORDER — MAGNESIUM CITRATE PO SOLN: 1.0000 | Freq: Once | ORAL | Status: DC | PRN

## 2017-11-23 MED ORDER — LIDOCAINE 2% (20 MG/ML) 5 ML SYRINGE
INTRAMUSCULAR | Status: AC
  Filled 2017-11-23: qty 5

## 2017-11-23 MED ORDER — ALUM & MAG HYDROXIDE-SIMETH 200-200-20 MG/5ML PO SUSP: 15.0000 mL | ORAL | Status: DC | PRN

## 2017-11-23 MED ORDER — EPHEDRINE SULFATE-NACL 50-0.9 MG/10ML-% IV SOSY
PREFILLED_SYRINGE | INTRAVENOUS | Status: DC | PRN
  Administered 2017-11-23: 600 mg via INTRAVENOUS
  Administered 2017-11-23: 20 mg via INTRAVENOUS

## 2017-11-23 MED ORDER — DOCUSATE SODIUM 100 MG PO CAPS: 100.0000 mg | ORAL_CAPSULE | Freq: Two times a day (BID) | ORAL | 0 refills | Status: DC

## 2017-11-23 MED ORDER — IRBESARTAN 150 MG PO TABS
150.0000 mg | ORAL_TABLET | Freq: Every day | ORAL | Status: DC
  Administered 2017-11-23 – 2017-11-24 (×2): 150 mg via ORAL
  Filled 2017-11-23 (×2): qty 1

## 2017-11-23 MED ORDER — LIDOCAINE 2% (20 MG/ML) 5 ML SYRINGE
INTRAMUSCULAR | Status: DC | PRN
  Administered 2017-11-23: 40 mg via INTRAVENOUS

## 2017-11-23 MED ORDER — STERILE WATER FOR IRRIGATION IR SOLN
Status: DC | PRN
  Administered 2017-11-23: 2000 mL

## 2017-11-23 MED ORDER — HYDROCODONE-ACETAMINOPHEN 7.5-325 MG PO TABS: 1.0000 | ORAL_TABLET | ORAL | 0 refills | Status: DC | PRN

## 2017-11-23 MED ORDER — METOCLOPRAMIDE HCL 5 MG PO TABS: 5.0000 mg | ORAL_TABLET | Freq: Three times a day (TID) | ORAL | Status: DC | PRN

## 2017-11-23 MED ORDER — FENTANYL CITRATE (PF) 100 MCG/2ML IJ SOLN
INTRAMUSCULAR | Status: AC
  Filled 2017-11-23: qty 2

## 2017-11-23 MED ORDER — CEFAZOLIN SODIUM-DEXTROSE 2-4 GM/100ML-% IV SOLN
INTRAVENOUS | Status: AC
  Filled 2017-11-23: qty 100

## 2017-11-23 MED ORDER — MENTHOL 3 MG MT LOZG: 1.0000 | LOZENGE | OROMUCOSAL | Status: DC | PRN

## 2017-11-23 MED ORDER — BUPIVACAINE-EPINEPHRINE (PF) 0.25% -1:200000 IJ SOLN
INTRAMUSCULAR | Status: DC | PRN
  Administered 2017-11-23: 30 mL

## 2017-11-23 MED ORDER — POLYETHYLENE GLYCOL 3350 17 G PO PACK: 17.0000 g | PACK | Freq: Two times a day (BID) | ORAL | 0 refills | Status: DC

## 2017-11-23 MED ORDER — BISACODYL 10 MG RE SUPP: 10.0000 mg | Freq: Every day | RECTAL | Status: DC | PRN

## 2017-11-23 MED ORDER — METHOCARBAMOL 500 MG PO TABS: 500.0000 mg | ORAL_TABLET | Freq: Four times a day (QID) | ORAL | 0 refills | Status: DC | PRN

## 2017-11-23 MED ORDER — DOCUSATE SODIUM 100 MG PO CAPS
100.0000 mg | ORAL_CAPSULE | Freq: Two times a day (BID) | ORAL | Status: DC
  Administered 2017-11-23 – 2017-11-24 (×2): 100 mg via ORAL
  Filled 2017-11-23 (×2): qty 1

## 2017-11-23 MED ORDER — BUPIVACAINE-EPINEPHRINE (PF) 0.25% -1:200000 IJ SOLN
INTRAMUSCULAR | Status: AC
  Filled 2017-11-23: qty 30

## 2017-11-23 SURGICAL SUPPLY — 46 items
BAG DECANTER FOR FLEXI CONT (MISCELLANEOUS) IMPLANT
BAG ZIPLOCK 12X15 (MISCELLANEOUS) IMPLANT
BANDAGE ACE 6X5 VEL STRL LF (GAUZE/BANDAGES/DRESSINGS) ×3 IMPLANT
BLADE SAW SGTL 11.0X1.19X90.0M (BLADE) IMPLANT
BLADE SAW SGTL 13.0X1.19X90.0M (BLADE) ×3 IMPLANT
BOWL SMART MIX CTS (DISPOSABLE) ×3 IMPLANT
CAPT KNEE TOTAL 3 ATTUNE ×3 IMPLANT
CEMENT HV SMART SET (Cement) ×6 IMPLANT
COVER SURGICAL LIGHT HANDLE (MISCELLANEOUS) ×3 IMPLANT
CUFF TOURN SGL QUICK 34 (TOURNIQUET CUFF) ×2
CUFF TRNQT CYL 34X4X40X1 (TOURNIQUET CUFF) ×1 IMPLANT
DECANTER SPIKE VIAL GLASS SM (MISCELLANEOUS) ×6 IMPLANT
DERMABOND ADVANCED (GAUZE/BANDAGES/DRESSINGS) ×2
DERMABOND ADVANCED .7 DNX12 (GAUZE/BANDAGES/DRESSINGS) ×1 IMPLANT
DRAPE U-SHAPE 47X51 STRL (DRAPES) ×3 IMPLANT
DRESSING AQUACEL AG SP 3.5X10 (GAUZE/BANDAGES/DRESSINGS) ×1 IMPLANT
DRSG AQUACEL AG ADV 3.5X10 (GAUZE/BANDAGES/DRESSINGS) ×3 IMPLANT
DRSG AQUACEL AG SP 3.5X10 (GAUZE/BANDAGES/DRESSINGS) ×3
DURAPREP 26ML APPLICATOR (WOUND CARE) ×6 IMPLANT
ELECT REM PT RETURN 15FT ADLT (MISCELLANEOUS) ×3 IMPLANT
GLOVE BIOGEL M 7.0 STRL (GLOVE) IMPLANT
GLOVE BIOGEL PI IND STRL 7.5 (GLOVE) ×5 IMPLANT
GLOVE BIOGEL PI IND STRL 8.5 (GLOVE) ×1 IMPLANT
GLOVE BIOGEL PI INDICATOR 7.5 (GLOVE) ×10
GLOVE BIOGEL PI INDICATOR 8.5 (GLOVE) ×2
GLOVE ECLIPSE 8.0 STRL XLNG CF (GLOVE) ×6 IMPLANT
GLOVE ORTHO TXT STRL SZ7.5 (GLOVE) ×6 IMPLANT
GOWN STRL REUS W/TWL LRG LVL3 (GOWN DISPOSABLE) ×6 IMPLANT
GOWN STRL REUS W/TWL XL LVL3 (GOWN DISPOSABLE) ×6 IMPLANT
HANDPIECE INTERPULSE COAX TIP (DISPOSABLE) ×2
MANIFOLD NEPTUNE II (INSTRUMENTS) ×3 IMPLANT
PACK TOTAL KNEE CUSTOM (KITS) ×3 IMPLANT
POSITIONER SURGICAL ARM (MISCELLANEOUS) ×3 IMPLANT
SET HNDPC FAN SPRY TIP SCT (DISPOSABLE) ×1 IMPLANT
SET PAD KNEE POSITIONER (MISCELLANEOUS) ×3 IMPLANT
SUT MNCRL AB 4-0 PS2 18 (SUTURE) ×3 IMPLANT
SUT STRATAFIX 0 PDS 27 VIOLET (SUTURE) ×3
SUT VIC AB 1 CT1 36 (SUTURE) ×3 IMPLANT
SUT VIC AB 2-0 CT1 27 (SUTURE) ×6
SUT VIC AB 2-0 CT1 TAPERPNT 27 (SUTURE) ×3 IMPLANT
SUTURE STRATFX 0 PDS 27 VIOLET (SUTURE) ×1 IMPLANT
SYR 50ML LL SCALE MARK (SYRINGE) ×3 IMPLANT
TRAY FOLEY W/METER SILVER 16FR (SET/KITS/TRAYS/PACK) ×3 IMPLANT
WATER STERILE IRR 1000ML POUR (IV SOLUTION) ×3 IMPLANT
WRAP KNEE MAXI GEL POST OP (GAUZE/BANDAGES/DRESSINGS) ×3 IMPLANT
YANKAUER SUCT BULB TIP 10FT TU (MISCELLANEOUS) ×3 IMPLANT

## 2017-11-23 NOTE — Anesthesia Procedure Notes (Signed)
Spinal  Patient location during procedure: OR Start time: 11/23/2017 9:40 AM End time: 11/23/2017 9:47 AM Staffing Anesthesiologist: Rica Koyanagi, MD Preanesthetic Checklist Completed: patient identified, site marked, surgical consent, pre-op evaluation, timeout performed, IV checked, risks and benefits discussed and monitors and equipment checked Spinal Block Patient position: sitting Prep: ChloraPrep and site prepped and draped Patient monitoring: heart rate, cardiac monitor, continuous pulse ox and blood pressure Approach: midline Location: L3-4 Injection technique: single-shot Needle Needle type: Quincke  Needle gauge: 25 G Needle length: 9 cm Needle insertion depth: 9 cm Assessment Sensory level: T6

## 2017-11-23 NOTE — Progress Notes (Signed)
AssistedDr. Orene Desanctis with left, ultrasound guided, adductor canal block. Side rails up, monitors on throughout procedure. See vital signs in flow sheet. Tolerated Procedure well.

## 2017-11-23 NOTE — Anesthesia Postprocedure Evaluation (Signed)
Anesthesia Post Note  Patient: Eugene White  Procedure(s) Performed: LEFT TOTAL KNEE ARTHROPLASTY (Left Knee)     Patient location during evaluation: PACU Anesthesia Type: Spinal Level of consciousness: oriented and awake and alert Pain management: pain level controlled Vital Signs Assessment: post-procedure vital signs reviewed and stable Respiratory status: spontaneous breathing, respiratory function stable and patient connected to nasal cannula oxygen Cardiovascular status: blood pressure returned to baseline and stable Postop Assessment: no headache, no backache and no apparent nausea or vomiting Anesthetic complications: no    Last Vitals:  Vitals:   11/23/17 1245 11/23/17 1258  BP: (!) 153/96 (!) 155/101  Pulse: (!) 58 67  Resp: 14 15  Temp: (!) 36.4 C (!) 36.4 C  SpO2: 100% 100%    Last Pain:  Vitals:   11/23/17 1258  TempSrc: Oral  PainSc: 0-No pain                 Shanekia Latella,JAMES TERRILL

## 2017-11-23 NOTE — Anesthesia Preprocedure Evaluation (Addendum)
Anesthesia Evaluation  Patient identified by MRN, date of birth, ID band Patient awake    Reviewed: Allergy & Precautions, NPO status , Patient's Chart, lab work & pertinent test results  Airway Mallampati: II  TM Distance: <3 FB Neck ROM: Full   Comment: R side facial droop apparent Dental  (+) Teeth Intact   Pulmonary pneumonia, former smoker,    breath sounds clear to auscultation       Cardiovascular hypertension, + dysrhythmias Atrial Fibrillation  Rhythm:Irregular Rate:Normal     Neuro/Psych    GI/Hepatic GERD  ,  Endo/Other  Hypothyroidism   Renal/GU      Musculoskeletal  (+) Arthritis ,   Abdominal   Peds  Hematology Hx of blood thinners   Anesthesia Other Findings   Reproductive/Obstetrics                            Anesthesia Physical Anesthesia Plan  ASA: III  Anesthesia Plan: Spinal   Post-op Pain Management:    Induction:   PONV Risk Score and Plan: 2 and Treatment may vary due to age or medical condition and Ondansetron  Airway Management Planned: Natural Airway and Simple Face Mask  Additional Equipment:   Intra-op Plan:   Post-operative Plan:   Informed Consent: I have reviewed the patients History and Physical, chart, labs and discussed the procedure including the risks, benefits and alternatives for the proposed anesthesia with the patient or authorized representative who has indicated his/her understanding and acceptance.     Plan Discussed with: CRNA  Anesthesia Plan Comments:         Anesthesia Quick Evaluation

## 2017-11-23 NOTE — Evaluation (Addendum)
Physical Therapy Evaluation Patient Details Name: Eugene White MRN: [id number] DOB: [birth year] Today's Date: 11/23/2017   History of Present Illness  71 y.o. 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  s/p left femoral nail 09/2016 with removal of pin 10/05/17 and had new onset afib. Admitted 11/23/17 for L TKR.  Clinical Impression  The patient  Experiencing numbness from spinal and did stand up at [facility]. Did not attempt ambulation. Pt admitted with above diagnosis. Pt currently with functional limitations due to the deficits listed below (see PT Problem List).  Pt will benefit from skilled PT to increase their independence and safety with mobility to allow discharge to the venue listed below.   BP 156/102 post mobility. HR 88. O2 sats=89% RA, replaced [place].     Follow Up Recommendations Outpatient PT;DC plan and follow up therapy as arranged by surgeon    Equipment Recommendations  None recommended by PT    Recommendations for Other Services       Precautions / Restrictions Precautions Precautions: Fall;Knee Restrictions Weight Bearing Restrictions: No      Mobility  Bed Mobility Overal bed mobility: Needs Assistance Bed Mobility: Supine to Sit;Sit to Supine     Supine to sit: Supervision Sit to supine: Supervision   General bed mobility comments: manages the  left leg without support.  Transfers  Sit to stand with min assist and verbal cues for safety due to numbness of the legs.Marland Kitchen 4 sidesteps along the bed taken./                  Ambulation/Gait                Stairs            Wheelchair Mobility    Modified Rankin (Stroke Patients Only)       Balance                                             Pertinent Vitals/Pain Pain Assessment: 0-10 Pain Score: 1  Pain Location: left knee, reports leags are still somewhat numb Pain Descriptors / Indicators:  Discomfort Pain Intervention(s): Repositioned;Ice applied;Monitored during session    [place] expects to be discharged to:: Private residence Living Arrangements: Spouse/significant other Available Help at Discharge: Family Type of Home: House Home Access: Stairs to enter Entrance Stairs-Rails: None Entrance Stairs-Number of Steps: 3 Home Layout: Able to live on main level with bedroom/bathroom Home Equipment: Walker - 2 wheels;Cane - single point;Bedside commode;Tub bench      Prior Function Level of Independence: Independent with assistive device(s)         Comments: pt reports using SPC for L hip pain prior to surgery     Hand Dominance        Extremity/Trunk Assessment   Upper Extremity Assessment Upper Extremity Assessment: Overall WFL for tasks assessed    Lower Extremity Assessment Lower Extremity Assessment: LLE deficits/detail;RLE deficits/detail RLE Deficits / Details: spinal continues RLE Sensation: decreased light touch LLE Deficits / Details: able to perform SLR and flex knee 0-90* LLE Sensation: decreased light touch       Communication   Communication: No difficulties  Cognition  General Comments      Exercises     Assessment/Plan    PT Assessment Patient needs continued PT services  PT Problem List Decreased strength;Decreased range of motion;Decreased activity tolerance;Decreased mobility;Decreased knowledge of precautions;Decreased safety awareness;Decreased knowledge of use of DME;Impaired sensation;Pain       PT Treatment Interventions DME instruction;Gait training;Stair training;Functional mobility training;Therapeutic activities;Therapeutic exercise    PT Goals (Current goals can be found in the Care Plan section)  Acute Rehab PT Goals Patient Stated Goal: to do things that I haven't been able to do PT Goal Formulation: With patient/family Time For  Goal Achievement: 11/27/17 Potential to Achieve Goals: Good    Frequency 7X/week   Barriers to discharge        Co-evaluation               AM-PAC PT "6 Clicks" Daily Activity  Outcome Measure Difficulty turning over in bed (including adjusting bedclothes, sheets and blankets)?: None Difficulty moving from lying on back to sitting on the side of the bed? : None Difficulty sitting down on and standing up from a chair with arms (e.g., wheelchair, bedside commode, etc,.)?: A Little Help needed moving to and from a bed to chair (including a wheelchair)?: A Little Help needed walking in hospital room?: Total Help needed climbing 3-5 steps with a railing? : Total 6 Click Score: 16    End of Session   Activity Tolerance: Patient tolerated treatment well Patient left: in bed;with call bell/phone within reach;with bed alarm set;with family/visitor present Nurse Communication: Mobility status PT Visit Diagnosis: Difficulty in walking, not elsewhere classified (R26.2);Pain Pain - part of body: Knee    Time: 1556-1630 PT Time Calculation (min) (ACUTE ONLY): 34 min   Charges:   PT Evaluation $PT Eval Low Complexity: 1 Low PT Treatments $Therapeutic Activity: 8-22 mins   PT G Codes:   PT G-Codes **NOT FOR INPATIENT CLASS** Functional Assessment Tool Used: Clinical judgement;AM-PAC 6 Clicks Basic Mobility Functional Limitation: Mobility: Walking and moving around Mobility: Walking and Moving Around Current Status (A1937): At least 40 percent but less than 60 percent impaired, limited or restricted Mobility: Walking and Moving Around Goal Status [phone]): At least 1 percent but less than 20 percent impaired, limited or restricted   [facility] PT [phone]   Claretha Cooper 11/23/2017, 4:41 PM

## 2017-11-23 NOTE — Anesthesia Procedure Notes (Addendum)
Anesthesia Regional Block: Adductor canal block   Pre-Anesthetic Checklist: ,, timeout performed, Correct Patient, Correct Site, Correct Laterality, Correct Procedure, Correct Position, site marked, Risks and benefits discussed,  Surgical consent,  Pre-op evaluation,  At surgeon's request and post-op pain management  Laterality: Left and Lower  Prep: chloraprep       Needles:   Needle Type: Echogenic Stimulator Needle     Needle Length: 9cm  Needle Gauge: 21   Needle insertion depth: 5 cm   Additional Needles:   Procedures:,,,, ultrasound used (permanent image in chart),,,,  Narrative:  Start time: 11/23/2017 8:40 AM End time: 11/23/2017 8:55 AM  Performed by: Personally  Anesthesiologist: Rica Koyanagi, MD

## 2017-11-23 NOTE — Transfer of Care (Signed)
Immediate Anesthesia Transfer of Care Note  Patient: Eugene White  Procedure(s) Performed: LEFT TOTAL KNEE ARTHROPLASTY (Left Knee)  Patient Location: PACU  Anesthesia Type:Spinal  Level of Consciousness: awake, alert , oriented and patient cooperative  Airway & Oxygen Therapy: Patient Spontanous Breathing and Patient connected to face mask  Post-op Assessment: Report given to RN and Post -op Vital signs reviewed and stable  Post vital signs: Reviewed and stable  Last Vitals:  Vitals:   11/23/17 0856 11/23/17 0900  BP:    Pulse: 61 68  Resp: 15 13  Temp:    SpO2: 99% 100%    Last Pain:  Vitals:   11/23/17 0722  TempSrc: Oral      Patients Stated Pain Goal: 4 (94/07/68 0881)  Complications: No apparent anesthesia complications

## 2017-11-23 NOTE — Op Note (Signed)
NAME:  Eugene White                      MEDICAL RECORD NO.:  [id number]                             FACILITY:  [facility]      PHYSICIAN:  Pietro Cassis. [name], M.D.  DATE OF BIRTH:  04-26-1946      DATE OF PROCEDURE:  11/23/2017                                     OPERATIVE REPORT         PREOPERATIVE DIAGNOSIS:  Left knee osteoarthritis.      POSTOPERATIVE DIAGNOSIS:  Left knee osteoarthritis.      FINDINGS:  The patient was noted to have complete loss of cartilage and   bone-on-bone arthritis with associated osteophytes in the medial and patellofemoral compartments of   the knee with a significant synovitis and associated effusion.      PROCEDURE:  Left total knee replacement.      COMPONENTS USED:  DePuy Attune rotating platform posterior stabilized knee   system, a size 6 femur, 7 tibia, size 10 mm PS AOX insert, and 38 anatomic patellar   button.      SURGEON:  Pietro Cassis. [name], M.D.      ASSISTANT:  Danae Orleans, PA-C.      ANESTHESIA:  Regional and Spinal.      SPECIMENS:  None.      COMPLICATION:  None.      DRAINS:  None.  EBL: <150cc      TOURNIQUET TIME:   Total Tourniquet Time Documented: Thigh (Left) - 25 minutes Total: Thigh (Left) - 25 minutes  .      The patient was stable to the recovery room.      INDICATION FOR PROCEDURE:  Eugene White is a 71 y.o. male patient of   mine.  The patient had been seen, evaluated, and treated conservatively in the   office with medication, activity modification, and injections.  The patient had   radiographic changes of bone-on-bone arthritis with endplate sclerosis and osteophytes noted.      The patient failed conservative measures including medication, injections, and activity modification, and at this point was ready for more definitive measures.   Based on the radiographic changes and failed conservative measures, the patient   decided to proceed with total knee replacement.  Risks of infection,   DVT,  component failure, need for revision surgery, postop course, and   expectations were all   discussed and reviewed.  Consent was obtained for benefit of pain   relief.      PROCEDURE IN DETAIL:  The patient was brought to the operative theater.   Once adequate anesthesia, preoperative antibiotics, 2 gm of Ancef, 1 gm of Tranexamic Acid, and 10 mg of Decadron administered, the patient was positioned supine with the left thigh tourniquet placed.  The  left lower extremity was prepped and draped in sterile fashion.  A time-   out was performed identifying the patient, planned procedure, and   extremity.      The left lower extremity was placed in the [facility] leg holder.  The leg was   exsanguinated, tourniquet elevated to 250 mmHg.  A midline incision was  made followed by median parapatellar arthrotomy.  Following initial   exposure, attention was first directed to the patella.  Precut   measurement was noted to be 24 mm.  I resected down to 14 mm and used a   38 anatomic patellar button to restore patellar height as well as cover the cut   surface.      The lug holes were drilled and a metal shim was placed to protect the   patella from retractors and saw blades.      At this point, attention was now directed to the femur.  The femoral   canal was opened with a drill, irrigated to try to prevent fat emboli.  An   intramedullary rod was passed at 5 degrees valgus, 9 mm of bone was   resected off the distal femur.  Following this resection, the tibia was   subluxated anteriorly.  Using the extramedullary guide, 2 mm of bone was resected off   the proximal medial tibia.  We confirmed the gap would be   stable medially and laterally with a size 6 spacer block as well as confirmed   the cut was perpendicular in the coronal plane, checking with an alignment rod.      Once this was done, I sized the femur to be a size 6 in the anterior-   posterior dimension, chose a standard component based  on medial and   lateral dimension.  The size 6 rotation block was then pinned in   position anterior referenced using the C-clamp to set rotation.  The   anterior, posterior, and  chamfer cuts were made without difficulty nor   notching making certain that I was along the anterior cortex to help   with flexion gap stability.      The final box cut was made off the lateral aspect of distal femur.      At this point, the tibia was sized to be a size 7, the size 7 tray was   then pinned in position through the medial third of the tubercle,   drilled, and keel punched.  Trial reduction was now carried with a 6 femur,  7 tibia, a size 6 then up to the 10 mm PS insert to have balanced flexion - extension gaps, and the 38 patella botton.  The knee was brought to   extension, full extension with good flexion stability with the patella   tracking through the trochlea without application of pressure.  Given   all these findings the femoral lug holes were drilled and then the trial components removed.  Final components were   opened and cement was mixed.  The knee was irrigated with normal saline   solution and pulse lavage.  The synovial lining was   then injected with 30 cc of 0.25% Marcaine with epinephrine and 1 cc of Toradol plus 30 cc of NS for a total of 61 cc.      The knee was irrigated.  Final implants were then cemented onto clean and   dried cut surfaces of bone with the knee brought to extension with a size 10 mm PS trial insert.      Once the cement had fully cured, the excess cement was removed   throughout the knee.  I confirmed I was satisfied with the range of   motion and stability, and the final size 10 mm PS AOX insert was chosen.  It was   placed into the knee.  The tourniquet had been let down at 25 minutes.  No significant   hemostasis required.  The   extensor mechanism was then reapproximated using #1 Vicryl and #0 Stratafix sutures with the knee   in flexion.  The    remaining wound was closed with 2-0 Vicryl and running 4-0 Monocryl.   The knee was cleaned, dried, dressed sterilely using Dermabond and   Aquacel dressing.  The patient was then   brought to recovery room in stable condition, tolerating the procedure   well.   Please note that Physician Assistant, Danae Orleans, PA-C, was present for the entirety of the case, and was utilized for pre-operative positioning, peri-operative retractor management, general facilitation of the procedure.  He was also utilized for primary wound closure at the end of the case.              Pietro Cassis [name], M.D.    11/23/2017 10:57 AM

## 2017-11-23 NOTE — Interval H&P Note (Signed)
History and Physical Interval Note:  11/23/2017 8:56 AM  Eugene White  has presented today for surgery, with the diagnosis of Left knee osteoarthritis  The various methods of treatment have been discussed with the patient and family. After consideration of risks, benefits and other options for treatment, the patient has consented to  Procedure(s) with comments: LEFT TOTAL KNEE ARTHROPLASTY (Left) - 90 mins as a surgical intervention .  The patient's history has been reviewed, patient examined, no change in status, stable for surgery.  I have reviewed the patient's chart and labs.  Questions were answered to the patient's satisfaction.     [name]

## 2017-11-24 DIAGNOSIS — Z96651 Presence of right artificial knee joint: Secondary | ICD-10-CM | POA: Diagnosis not present

## 2017-11-24 DIAGNOSIS — I10 Essential (primary) hypertension: Secondary | ICD-10-CM | POA: Diagnosis not present

## 2017-11-24 DIAGNOSIS — M1712 Unilateral primary osteoarthritis, left knee: Secondary | ICD-10-CM | POA: Diagnosis not present

## 2017-11-24 DIAGNOSIS — E7800 Pure hypercholesterolemia, unspecified: Secondary | ICD-10-CM | POA: Diagnosis not present

## 2017-11-24 DIAGNOSIS — I4891 Unspecified atrial fibrillation: Secondary | ICD-10-CM | POA: Diagnosis not present

## 2017-11-24 DIAGNOSIS — E039 Hypothyroidism, unspecified: Secondary | ICD-10-CM | POA: Diagnosis not present

## 2017-11-24 DIAGNOSIS — M25562 Pain in left knee: Secondary | ICD-10-CM | POA: Diagnosis not present

## 2017-11-24 DIAGNOSIS — K219 Gastro-esophageal reflux disease without esophagitis: Secondary | ICD-10-CM | POA: Diagnosis not present

## 2017-11-24 DIAGNOSIS — F329 Major depressive disorder, single episode, unspecified: Secondary | ICD-10-CM | POA: Diagnosis not present

## 2017-11-24 LAB — BASIC METABOLIC PANEL
Anion gap: 6 (ref 5–15)
BUN: 13 mg/dL (ref 6–20)
CALCIUM: 8.4 mg/dL — AB (ref 8.9–10.3)
CO2: 28 mmol/L (ref 22–32)
CREATININE: 0.65 mg/dL (ref 0.61–1.24)
Chloride: 100 mmol/L — ABNORMAL LOW (ref 101–111)
Glucose, Bld: 144 mg/dL — ABNORMAL HIGH (ref 65–99)
Potassium: 3.4 mmol/L — ABNORMAL LOW (ref 3.5–5.1)
SODIUM: 134 mmol/L — AB (ref 135–145)

## 2017-11-24 LAB — CBC
HCT: 33.7 % — ABNORMAL LOW (ref 39.0–52.0)
HEMOGLOBIN: 11.4 g/dL — AB (ref 13.0–17.0)
MCH: 34.2 pg — ABNORMAL HIGH (ref 26.0–34.0)
MCHC: 33.8 g/dL (ref 30.0–36.0)
MCV: 101.2 fL — ABNORMAL HIGH (ref 78.0–100.0)
PLATELETS: 188 10*3/uL (ref 150–400)
RBC: 3.33 MIL/uL — ABNORMAL LOW (ref 4.22–5.81)
RDW: 13.1 % (ref 11.5–15.5)
WBC: 13.3 10*3/uL — ABNORMAL HIGH (ref 4.0–10.5)

## 2017-11-24 NOTE — Progress Notes (Signed)
Physical Therapy Treatment Patient Details Name: Eugene White MRN: [id number] DOB: [birth year] Today's Date: 11/24/2017    History of Present Illness 71 y.o. 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  s/p left femoral nail 09/2016 with removal of pin 10/05/17 and had new onset afib. Admitted 11/23/17 for L TKR.    PT Comments    POD 1, PM session  Ambulated 70 feet to and from stairs. Patient stated having "door rails" to grab at the bottom of 2 steps. Patient required 25% VC's for hand placement and RW management with stairs. Instructed patient and spouse on ice and ACE wrap use. Informed RN of completion of therapy. Patient to go to Outpatient on 11/30.    Follow Up Recommendations  Outpatient PT;DC plan and follow up therapy as arranged by surgeon     Equipment Recommendations  None recommended by PT    Recommendations for Other Services       Precautions / Restrictions Precautions Precautions: Fall;Knee Restrictions Weight Bearing Restrictions: No    Mobility  Bed Mobility  General bed mobility comments: OOB  Transfers Overall transfer level: Needs assistance Equipment used: Rolling walker (2 wheeled) Transfers: Sit to/from Stand Sit to Stand: Supervision;Min guard Stand pivot transfers: Supervision       General transfer comment: min assist with 25% VC's for hand placement  Ambulation/Gait Ambulation/Gait assistance: Min guard;Supervision Ambulation Distance (Feet): 70 Feet Assistive device: Rolling walker (2 wheeled) Gait Pattern/deviations: Step-to pattern;Decreased step length - left;Decreased stance time - left;Decreased stride length Gait velocity: decreased Gait velocity interpretation: Below normal speed for age/gender General Gait Details: min guard for safety, required 25% VC's for safety with turns   Stairs Stairs: Yes   Stair Management: Two rails;Step to  pattern;Forwards Number of Stairs: 2 General stair comments: patient stated having small steps and able to grab "door rails" to assist up. Patient required 25% VC's for hand placement and RW management.  Wheelchair Mobility    Modified Rankin (Stroke Patients Only)       Balance                                            Cognition Arousal/Alertness: Awake/alert Behavior During Therapy: WFL for tasks assessed/performed Overall Cognitive Status: Within Functional Limits for tasks assessed                                        Exercises    General Comments        Pertinent Vitals/Pain Pain Assessment: 0-10 Pain Score: 5  Pain Location: l knee Pain Descriptors / Indicators: Discomfort;Tightness Pain Intervention(s): Monitored during session;Repositioned;Ice applied    Home Living Family/patient expects to be discharged to:: Private residence Living Arrangements: Spouse/significant other Available Help at Discharge: Family Type of Home: House Home Access: Stairs to enter Entrance Stairs-Rails: None Home Layout: Able to live on main level with bedroom/bathroom Home Equipment: Environmental consultant - 2 wheels;Cane - single point;Bedside commode;Tub bench      Prior Function Level of Independence: Independent with assistive device(s)      Comments: pt reports using SPC for L hip pain prior to surgery   PT Goals (current goals can now be found in the care plan section) Acute  Rehab PT Goals Patient Stated Goal: to do things that I haven't been able to do    Frequency    7X/week      PT Plan      Co-evaluation              AM-PAC PT "6 Clicks" Daily Activity  Outcome Measure  Difficulty turning over in bed (including adjusting bedclothes, sheets and blankets)?: None Difficulty moving from lying on back to sitting on the side of the bed? : None Difficulty sitting down on and standing up from a chair with arms (e.g., wheelchair,  bedside commode, etc,.)?: None Help needed moving to and from a bed to chair (including a wheelchair)?: A Little Help needed walking in hospital room?: A Little Help needed climbing 3-5 steps with a railing? : A Little 6 Click Score: 21    End of Session Equipment Utilized During Treatment: Gait belt Activity Tolerance: Patient tolerated treatment well Patient left: in chair;with call bell/phone within reach;with family/visitor present Nurse Communication: Mobility status PT Visit Diagnosis: Difficulty in walking, not elsewhere classified (R26.2);Pain Pain - part of body: Knee     Time: 1250-1305 PT Time Calculation (min) (ACUTE ONLY): 15 min  Charges:  $Gait Training: 8-22 mins $Therapeutic Exercise: 8-22 mins                    G Codes:       Almond Lint, SPTA [facility]  PTA [facility]  Acute  Rehab Pager      [phone]

## 2017-11-24 NOTE — Care Management Obs Status (Signed)
[place] NOTIFICATION   Patient Details  Name: Eugene White MRN: [id number] Date of Birth: 1946/03/23   Medicare Observation Status Notification Given:  Yes    Guadalupe Maple, RN 11/24/2017, 10:40 AM

## 2017-11-24 NOTE — Progress Notes (Signed)
Patient ID: Eugene White, male   DOB: 04/14/1946, 71 y.o.   MRN: [id number] Subjective: 1 Day Post-Op Procedure(s) (LRB): LEFT TOTAL KNEE ARTHROPLASTY (Left)    Patient reports pain as mild to moderate. No events. Wonderful attitude  Objective:   VITALS:   Vitals:   11/24/17 0115 11/24/17 0550  BP: (!) 157/98 131/87  Pulse: 91 78  Resp: 20 17  Temp: 98.2 F (36.8 C) 98.3 F (36.8 C)  SpO2: 98% 98%    Neurovascular intact Incision: dressing C/D/I  LABS Recent Labs    11/24/17 0540  HGB 11.4*  HCT 33.7*  WBC 13.3*  PLT 188    Recent Labs    11/24/17 0540  NA 134*  K 3.4*  BUN 13  CREATININE 0.65  GLUCOSE 144*    No results for input(s): LABPT, INR in the last 72 hours.   Assessment/Plan: 1 Day Post-Op Procedure(s) (LRB): LEFT TOTAL KNEE ARTHROPLASTY (Left)   Advance diet Up with therapy  Home today after therapy Give protonics when he takes at home Outpt PT already scheduled RTC in 2 weeks

## 2017-11-24 NOTE — Progress Notes (Signed)
Physical Therapy Treatment Patient Details Name: Eugene White MRN: [id number] DOB: 1946-10-06 Today's Date: 11/24/2017    History of Present Illness 71 y.o. 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  s/p left femoral nail 09/2016 with removal of pin 10/05/17 and had new onset afib. Admitted 11/23/17 for L TKR.    PT Comments    POD 1 am session  Ambulated 40 feet with min guard for safety, requiring VC's for sequencing with walker. Performed therex in recliner. Positioned with ice. Will perform stairs in PM session.     Follow Up Recommendations  Outpatient PT;DC plan and follow up therapy as arranged by surgeon     Equipment Recommendations  None recommended by PT    Recommendations for Other Services       Precautions / Restrictions Precautions Precautions: Fall;Knee Restrictions Weight Bearing Restrictions: No    Mobility  Bed Mobility Overal bed mobility: Needs Assistance Bed Mobility: Supine to Sit     Supine to sit: Supervision;Min guard     General bed mobility comments: manages left LE without assist, min guard for safety  Transfers Overall transfer level: Needs assistance Equipment used: Rolling walker (2 wheeled) Transfers: Sit to/from Stand Sit to Stand: Min guard;Supervision         General transfer comment: min guard for safety, 25% VC's for hand placement   Ambulation/Gait Ambulation/Gait assistance: Min guard;Supervision Ambulation Distance (Feet): 40 Feet Assistive device: Rolling walker (2 wheeled) Gait Pattern/deviations: Step-to pattern;Decreased step length - left;Decreased stance time - left;Decreased stride length Gait velocity: decreased Gait velocity interpretation: Below normal speed for age/gender General Gait Details: min guard for safety, required 25% VC's for sequencing   Stairs            Wheelchair Mobility    Modified Rankin (Stroke Patients  Only)       Balance                                            Cognition Arousal/Alertness: Awake/alert Behavior During Therapy: WFL for tasks assessed/performed Overall Cognitive Status: Within Functional Limits for tasks assessed                                        Exercises Total Joint Exercises Ankle Circles/Pumps: Both;AROM;20 reps;Seated Quad Sets: Left;AROM;10 reps;Seated Towel Squeeze: AROM;Both;10 reps;Seated Short Arc Quad: AROM;Left;10 reps;Seated Heel Slides: Left;10 reps;AAROM;Supine Hip ABduction/ADduction: AAROM;Left;Supine;10 reps Straight Leg Raises: AAROM;Left;Supine;10 reps    General Comments        Pertinent Vitals/Pain Pain Assessment: No/denies pain Pain Score: 1  Pain Location: left knee, reports thinking he has had too mcuh pain meds Pain Descriptors / Indicators: Discomfort;Tightness Pain Intervention(s): Monitored during session;Repositioned;Ice applied    Home Living                      Prior Function            PT Goals (current goals can now be found in the care plan section)      Frequency    7X/week      PT Plan      Co-evaluation              AM-PAC  PT "6 Clicks" Daily Activity  Outcome Measure  Difficulty turning over in bed (including adjusting bedclothes, sheets and blankets)?: None Difficulty moving from lying on back to sitting on the side of the bed? : None Difficulty sitting down on and standing up from a chair with arms (e.g., wheelchair, bedside commode, etc,.)?: A Little Help needed moving to and from a bed to chair (including a wheelchair)?: A Little Help needed walking in hospital room?: A Little Help needed climbing 3-5 steps with a railing? : A Lot 6 Click Score: 19    End of Session Equipment Utilized During Treatment: Gait belt Activity Tolerance: Patient tolerated treatment well Patient left: in chair;with call bell/phone within reach Nurse  Communication: Mobility status PT Visit Diagnosis: Difficulty in walking, not elsewhere classified (R26.2);Pain Pain - part of body: Knee     Time: 3646-8032 PT Time Calculation (min) (ACUTE ONLY): 29 min  Charges:  $Gait Training: 8-22 mins $Therapeutic Exercise: 8-22 mins                    G Codes:       Almond Lint, SPTA [facility]  PTA [facility]  Acute  Rehab Pager      [phone]

## 2017-11-24 NOTE — Discharge Instructions (Signed)

## 2017-11-24 NOTE — Progress Notes (Signed)
Discharge planning, no HH needs identified. Plan for OP PT, has DME. [phone] 

## 2017-11-24 NOTE — Evaluation (Signed)
Occupational Therapy Evaluation Patient Details Name: Eugene White MRN: [id number] DOB: 1946/04/18 Today's Date: 11/24/2017    History of Present Illness 71 y.o. 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  s/p left femoral nail 09/2016 with removal of pin 10/05/17 and had new onset afib. Admitted 11/23/17 for L TKR.   Clinical Impression   No further OT needs   Follow Up Recommendations  No OT follow up    Equipment Recommendations  None recommended by OT    Recommendations for Other Services       Precautions / Restrictions Precautions Precautions: Fall;Knee Restrictions Weight Bearing Restrictions: No      Mobility Bed Mobility Overal bed mobility: Needs Assistance Bed Mobility: Supine to Sit     Supine to sit: Supervision     General bed mobility comments: manages left LE without assist, min guard for safety  Transfers Overall transfer level: Needs assistance Equipment used: Rolling walker (2 wheeled) Transfers: Sit to/from [employer] Transfers Sit to Stand: Supervision Stand pivot transfers: Supervision       General transfer comment: min guard for safety, 25% VC's for hand placement         ADL either performed or assessed with clinical judgement   ADL Overall ADL's : Needs assistance/impaired Eating/Feeding: Set up;Sitting   Grooming: Set up;Sitting   Upper Body Bathing: Set up;Sitting   Lower Body Bathing: Minimal assistance;Sit to/from stand   Upper Body Dressing : Set up;Sitting   Lower Body Dressing: Minimal assistance;Sit to/from stand   Toilet Transfer: Min guard;RW;Comfort height toilet   Toileting- Water quality scientist and Hygiene: Min guard;Sit to/from Nurse, children's Details (indicate cue type and reason): veralized safety Functional mobility during ADLs: Min guard General ADL Comments: wife will A as needed     Vision Patient Visual  Report: No change from baseline              Pertinent Vitals/Pain Pain Assessment: No/denies pain Pain Score: 4  Pain Location: l knee Pain Descriptors / Indicators: Discomfort;Tightness Pain Intervention(s): Limited activity within patient's tolerance;Repositioned     Hand Dominance     Extremity/Trunk Assessment Upper Extremity Assessment Upper Extremity Assessment: Overall WFL for tasks assessed           Communication Communication Communication: No difficulties   Cognition Arousal/Alertness: Awake/alert Behavior During Therapy: WFL for tasks assessed/performed Overall Cognitive Status: Within Functional Limits for tasks assessed                                          Home Living Family/patient expects to be discharged to:: Private residence Living Arrangements: Spouse/significant other Available Help at Discharge: Family Type of Home: House Home Access: Stairs to enter Technical brewer of Steps: 3 Entrance Stairs-Rails: None Home Layout: Able to live on main level with bedroom/bathroom     Bathroom Shower/Tub: Teacher, early years/pre: Handicapped height     Home Equipment: Environmental consultant - 2 wheels;Cane - single point;Bedside commode;Tub bench          Prior Functioning/Environment Level of Independence: Independent with assistive device(s)        Comments: pt reports using SPC for L hip pain prior to surgery              OT Treatment/Interventions:  OT Goals(Current goals can be found in the care plan section) Acute Rehab OT Goals Patient Stated Goal: to do things that I haven't been able to do  OT Frequency:      AM-PAC PT "6 Clicks" Daily Activity     Outcome Measure Help from another person eating meals?: None Help from another person taking care of personal grooming?: None Help from another person toileting, which includes using toliet, bedpan, or urinal?: A Little Help from another person bathing  (including washing, rinsing, drying)?: None Help from another person to put on and taking off regular upper body clothing?: None Help from another person to put on and taking off regular lower body clothing?: A Little 6 Click Score: 22   End of Session Equipment Utilized During Treatment: Rolling walker Nurse Communication: Mobility status  Activity Tolerance: Patient tolerated treatment well Patient left: in chair  OT Visit Diagnosis: Unsteadiness on feet (R26.81);Pain Pain - Right/Left: Left Pain - part of body: Knee                Time: 1145-1156 OT Time Calculation (min): 11 min Charges:  OT General Charges $OT Visit: 1 Visit OT Evaluation $OT Eval Low Complexity: 1 Low G-Codes: OT G-codes **NOT FOR INPATIENT CLASS** Functional Assessment Tool Used: Clinical judgement Functional Limitation: Self care Self Care Current Status (O5366): At least 20 percent but less than 40 percent impaired, limited or restricted Self Care Goal Status (Y4034): At least 1 percent but less than 20 percent impaired, limited or restricted Self Care Discharge Status [phone]): At least 20 percent but less than 40 percent impaired, limited or restricted   Kari Baars, [place] Felsenthal  Payton Mccallum D 11/24/2017, 12:18 PM

## 2017-11-24 NOTE — Discharge Summary (Signed)
Physician Discharge Summary  Patient ID: Eugene White MRN: [id number] DOB/AGE: 1946-07-12 71 y.o.  Admit date: 11/23/2017 Discharge date: 11/24/2017   Procedures:  Procedure(s) (LRB): LEFT TOTAL KNEE ARTHROPLASTY (Left)  Attending Physician:  Dr. [name]   Admission Diagnoses:    Left knee primary OA / pain  Discharge Diagnoses:  Principal Problem:   S/P left TKA  Past Medical History:  Diagnosis Date  . Anemia    hx of one time  . Arthritis    OA  . Atrial fibrillation with controlled ventricular rate ([place]) 10/05/2017  . Difficulty sleeping   . Diverticulosis 2014  . 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 GERD (gastroesophageal reflux disease)   . GI symptom    had nausea / vomited once / frequent stools / getting better  . Hyperlipidemia   . Hypertension   . Mood changes   . Pneumonia    hx of  . Thyroid disease    hypo    HPI:    Eugene White, 71 y.o. male, has a history of pain and functional disability in the left knee due to arthritis and has failed non-surgical conservative treatments for greater than 12 weeks to includeNSAID's and/or analgesics, corticosteriod injections and activity modification.  Onset of symptoms was gradual, starting 3+ years ago with gradually worsening course since that time. The patient noted prior procedures on the knee to include  arthroplasty on the right knee per Dr. [name] in January 2016.  Patient currently rates pain in the left knee(s) at 7 out of 10 with activity. Patient has night pain, worsening of pain with activity and weight bearing, pain that interferes with activities of daily living, pain with passive range of motion, crepitus and joint swelling.  Patient has evidence of periarticular osteophytes and joint space narrowing by imaging studies.  There is no active infection.  Risks, benefits and expectations were discussed with the patient.  Risks including but not limited to the risk of  anesthesia, blood clots, nerve damage, blood vessel damage, failure of the prosthesis, infection and up to and including death.  Patient understand the risks, benefits and expectations and wishes to proceed with surgery.  PCP: [name], PA   Discharged Condition: good  Hospital Course:  Patient underwent the above stated procedure on 11/23/2017. Patient tolerated the procedure well and brought to the recovery room in good condition and subsequently to the floor.  POD #1 BP: 131/87 ; Pulse: 78 ; Temp: 98.3 F (36.8 C) ; Resp: 17 Patient reports pain as mild to moderate. No events. Wonderful attitude. Neurovascular intact and incision: dressing C/D/I.   LABS  Basename    HGB     11.4  HCT     33.7    Discharge Exam: General appearance: alert, cooperative and no distress Extremities: Homans sign is negative, no sign of DVT, no edema, redness or tenderness in the calves or thighs and no ulcers, gangrene or trophic changes  Disposition: Home with follow up in 2 weeks   Follow-up Information    [name], MD. Schedule an appointment as soon as possible for a visit in 2 week(s).   Specialty:  Orthopedic Surgery Contact information: [address] [place] [address] [phone]           Discharge Instructions    Call MD / Call 911   Complete by:  As directed    If you  experience chest pain or shortness of breath, CALL 911 and be transported to the hospital emergency room.  If you develope a fever above 101 F, pus (white drainage) or increased drainage or redness at the wound, or calf pain, call your surgeon's office.   Change dressing   Complete by:  As directed    Maintain surgical dressing until follow up in the clinic. If the edges start to pull up, may reinforce with tape. If the dressing is no longer working, may remove and cover with gauze and tape, but must keep the area dry and clean.  Call with any questions or concerns.    Constipation Prevention   Complete by:  As directed    Drink plenty of fluids.  Prune juice may be helpful.  You may use a stool softener, such as Colace (over the counter) 100 mg twice a day.  Use MiraLax (over the counter) for constipation as needed.   Diet - low sodium heart healthy   Complete by:  As directed    Discharge instructions   Complete by:  As directed    Maintain surgical dressing until follow up in the clinic. If the edges start to pull up, may reinforce with tape. If the dressing is no longer working, may remove and cover with gauze and tape, but must keep the area dry and clean.  [facility] in [facility] at [facility]. Call with any questions or concerns.   Increase activity slowly as tolerated   Complete by:  As directed    Weight bearing as tolerated with assist device (walker, cane, etc) as directed, use it as long as suggested by your surgeon or therapist, typically at least 4-6 weeks.   TED hose   Complete by:  As directed    Use stockings (TED hose) for 2 weeks on both leg(s).  You may remove them at night for sleeping.      Allergies as of 11/24/2017      Reactions   Sulfa Antibiotics Other (See Comments)   unknown      Medication List    TAKE these medications   apixaban 5 MG Tabs tablet Commonly known as:  ELIQUIS Take 1 tablet (5 mg total) by mouth 2 (two) times daily.   atorvastatin 20 MG tablet Commonly known as:  LIPITOR TAKE 1 TABLET EVERY DAY What changed:    how much to take  how to take this  when to take this   docusate sodium 100 MG capsule Commonly known as:  COLACE Take 1 capsule (100 mg total) by mouth 2 (two) times daily.   ferrous sulfate 325 (65 FE) MG tablet Commonly known as:  FERROUSUL Take 1 tablet (325 mg total) by mouth 3 (three) times daily with meals. What changed:  when to take this   HYDROcodone-acetaminophen 7.5-325 MG tablet Commonly known as:  NORCO Take 1-2 tablets by mouth every 4 (four) hours as  needed for moderate pain or severe pain.   levothyroxine 150 MCG tablet Commonly known as:  SYNTHROID, LEVOTHROID TAKE 1 TABLET EVERY DAY What changed:    how much to take  how to take this  when to take this   methocarbamol 500 MG tablet Commonly known as:  ROBAXIN Take 1 tablet (500 mg total) by mouth every 6 (six) hours as needed for muscle spasms.   metoprolol tartrate 50 MG tablet Commonly known as:  LOPRESSOR Take 1 tablet (50 mg total) by mouth 2 (two) times daily.  multivitamin with minerals Tabs tablet Take 1 tablet by mouth daily at 12 noon.   pantoprazole 40 MG tablet Commonly known as:  PROTONIX TAKE 1 TABLET EVERY DAY What changed:    how much to take  how to take this  when to take this   polycarbophil 625 MG tablet Commonly known as:  FIBERCON Take 1,250 mg by mouth daily at 12 noon.   polyethylene glycol packet Commonly known as:  MIRALAX / GLYCOLAX Take 17 g by mouth 2 (two) times daily.   PRESERVISION AREDS 2 PO Take 2 tablets by mouth daily at 12 noon.   valsartan-hydrochlorothiazide 160-25 MG tablet Commonly known as:  DIOVAN-HCT TAKE 1 TABLET EVERY DAY What changed:    how much to take  how to take this  when to take this   venlafaxine XR 75 MG 24 hr capsule Commonly known as:  EFFEXOR-XR TAKE 1 CAPSULE EVERY DAY What changed:    how much to take  how to take this  when to take this   Vitamin D 2000 units Caps Take 2,000 Units by mouth daily at 12 noon.            Discharge Care Instructions  (From admission, onward)        Start     Ordered   11/24/17 0000  Change dressing    Comments:  Maintain surgical dressing until follow up in the clinic. If the edges start to pull up, may reinforce with tape. If the dressing is no longer working, may remove and cover with gauze and tape, but must keep the area dry and clean.  Call with any questions or concerns.   11/24/17 [id number]       Signed: West Pugh. Mate Alegria    PA-C  11/24/2017, 2:33 PM

## 2017-11-27 DIAGNOSIS — M25562 Pain in left knee: Secondary | ICD-10-CM | POA: Diagnosis not present

## 2017-11-27 NOTE — Addendum Note (Signed)
Addendum  created 11/27/17 1110 by Rica Koyanagi, MD   Intraprocedure Blocks edited, Sign clinical note

## 2017-11-30 ENCOUNTER — Ambulatory Visit (HOSPITAL_COMMUNITY)
Admission: RE | Admit: 2017-11-30 | Discharge: 2017-11-30 | Disposition: A | Discharge status: Home / Self Care | Payer: Medicare HMO | Source: Ambulatory Visit | Attending: Orthopedic Surgery | Admitting: Orthopedic Surgery

## 2017-11-30 ENCOUNTER — Other Ambulatory Visit (HOSPITAL_COMMUNITY): Payer: Self-pay | Admitting: Orthopedic Surgery

## 2017-11-30 DIAGNOSIS — M79605 Pain in left leg: Secondary | ICD-10-CM | POA: Diagnosis not present

## 2017-11-30 DIAGNOSIS — M7989 Other specified soft tissue disorders: Secondary | ICD-10-CM | POA: Diagnosis not present

## 2017-11-30 NOTE — Progress Notes (Signed)
LLE venous duplex prelim: negative for DVT. Landry Mellow, RDMS, RVT  Attempted call report with no answer.

## 2017-12-02 DIAGNOSIS — M25562 Pain in left knee: Secondary | ICD-10-CM | POA: Diagnosis not present

## 2017-12-04 DIAGNOSIS — M25562 Pain in left knee: Secondary | ICD-10-CM | POA: Diagnosis not present

## 2017-12-09 DIAGNOSIS — M25562 Pain in left knee: Secondary | ICD-10-CM | POA: Diagnosis not present

## 2017-12-11 DIAGNOSIS — M25562 Pain in left knee: Secondary | ICD-10-CM | POA: Diagnosis not present

## 2017-12-14 DIAGNOSIS — M25562 Pain in left knee: Secondary | ICD-10-CM | POA: Diagnosis not present

## 2017-12-16 DIAGNOSIS — M25562 Pain in left knee: Secondary | ICD-10-CM | POA: Diagnosis not present

## 2017-12-21 DIAGNOSIS — M25562 Pain in left knee: Secondary | ICD-10-CM | POA: Diagnosis not present

## 2017-12-23 DIAGNOSIS — R198 Other specified symptoms and signs involving the digestive system and abdomen: Secondary | ICD-10-CM | POA: Insufficient documentation

## 2017-12-23 DIAGNOSIS — G479 Sleep disorder, unspecified: Secondary | ICD-10-CM | POA: Insufficient documentation

## 2017-12-23 DIAGNOSIS — E785 Hyperlipidemia, unspecified: Secondary | ICD-10-CM | POA: Insufficient documentation

## 2017-12-23 DIAGNOSIS — R4586 Emotional lability: Secondary | ICD-10-CM | POA: Insufficient documentation

## 2017-12-23 DIAGNOSIS — M1990 Unspecified osteoarthritis, unspecified site: Secondary | ICD-10-CM | POA: Insufficient documentation

## 2017-12-23 DIAGNOSIS — E079 Disorder of thyroid, unspecified: Secondary | ICD-10-CM | POA: Insufficient documentation

## 2017-12-23 DIAGNOSIS — J189 Pneumonia, unspecified organism: Secondary | ICD-10-CM | POA: Insufficient documentation

## 2017-12-23 DIAGNOSIS — K219 Gastro-esophageal reflux disease without esophagitis: Secondary | ICD-10-CM | POA: Insufficient documentation

## 2017-12-23 DIAGNOSIS — I10 Essential (primary) hypertension: Secondary | ICD-10-CM | POA: Insufficient documentation

## 2017-12-24 DIAGNOSIS — M25562 Pain in left knee: Secondary | ICD-10-CM | POA: Diagnosis not present

## 2017-12-28 DIAGNOSIS — M25562 Pain in left knee: Secondary | ICD-10-CM | POA: Diagnosis not present

## 2017-12-31 DIAGNOSIS — M25562 Pain in left knee: Secondary | ICD-10-CM | POA: Diagnosis not present

## 2017-12-31 DIAGNOSIS — M1712 Unilateral primary osteoarthritis, left knee: Secondary | ICD-10-CM | POA: Diagnosis not present

## 2017-12-31 DIAGNOSIS — Z96659 Presence of unspecified artificial knee joint: Secondary | ICD-10-CM | POA: Diagnosis not present

## 2017-12-31 DIAGNOSIS — Z96652 Presence of left artificial knee joint: Secondary | ICD-10-CM | POA: Diagnosis not present

## 2018-01-04 DIAGNOSIS — M25562 Pain in left knee: Secondary | ICD-10-CM | POA: Diagnosis not present

## 2018-01-06 DIAGNOSIS — M25562 Pain in left knee: Secondary | ICD-10-CM | POA: Diagnosis not present

## 2018-01-08 ENCOUNTER — Encounter: Payer: Self-pay | Admitting: Cardiology

## 2018-01-08 ENCOUNTER — Encounter: Payer: Self-pay | Admitting: *Deleted

## 2018-01-08 ENCOUNTER — Ambulatory Visit: Payer: Medicare HMO | Admitting: Cardiology

## 2018-01-08 VITALS — BP 136/76 | HR 97 | Ht 71.0 in | Wt 203.4 lb

## 2018-01-08 DIAGNOSIS — T8450XA Infection and inflammatory reaction due to unspecified internal joint prosthesis, initial encounter: Secondary | ICD-10-CM

## 2018-01-08 DIAGNOSIS — E785 Hyperlipidemia, unspecified: Secondary | ICD-10-CM

## 2018-01-08 DIAGNOSIS — I4891 Unspecified atrial fibrillation: Secondary | ICD-10-CM | POA: Diagnosis not present

## 2018-01-08 DIAGNOSIS — Z7901 Long term (current) use of anticoagulants: Secondary | ICD-10-CM

## 2018-01-08 DIAGNOSIS — Z96652 Presence of left artificial knee joint: Secondary | ICD-10-CM | POA: Diagnosis not present

## 2018-01-08 DIAGNOSIS — I10 Essential (primary) hypertension: Secondary | ICD-10-CM

## 2018-01-08 DIAGNOSIS — R0989 Other specified symptoms and signs involving the circulatory and respiratory systems: Secondary | ICD-10-CM | POA: Diagnosis not present

## 2018-01-08 NOTE — Progress Notes (Signed)
Cardiology Office Note    Date:  01/08/2018   ID:  PRICE LACHAPELLE, DOB [birth year], MRN [id number]  PCP:  [name], PA  Cardiologist: Dr. [name]  Chief complain: DOE, left knee swelling  History of Present Illness:  Eugene White is a 72 y.o. male with history of hypertension, HLD, hypothyroidism. He underwent surgery for left femoral nail removal. During surgery he went into atrial fib with RVR which was new for him. 2-D echo showed normal LV function with mildly dilated left atrium.CHADSVASC=2 for hypertension and age and he was started on Eliquis 5 mg BID with plans to cardiovert 4 weeks.  10/13/17 - Patient comes in today accompanied by his wife. His total knee replacement scheduled for November 26. He doesn't feel good and atrial fibrillation since his medications were changed. His blood pressure is running low. He can't feel his heart beating irregular unless he takes his pulse. No fast heart rates. Denies chest pain and was fairly active until he had the nail removed from his hip.  01/08/2018 - this is 3 months follow-up, the patient underwent left knee replacement on 11/24/2017, he completed 3 weeks of Keflex, he denies any fever or chills however has experienced swelling and erythema since the surgery that gets significantly worse since last week. His currently unable to flex his knee or walk properly. He denies any chest pain, or palpitations however with minimal exertion he gets short of breath such as walking flight of stairs. He has been using Eliquis without any interruptions and denies any bleeding. The patient describes paresthesias and numbness in 3 of his right fingers that resolved and his morning. The patient is also complaining of bruit in his left ear.  Past Medical History:  Diagnosis Date  . Anemia    hx of one time  . Arthritis    OA  . Atrial fibrillation with controlled ventricular rate ([place]) 10/05/2017  . Difficulty sleeping   . Diverticulosis  2014  . 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 GERD (gastroesophageal reflux disease)   . GI symptom    had nausea / vomited once / frequent stools / getting better  . Hyperlipidemia   . Hypertension   . Mood changes   . Pneumonia    hx of  . Thyroid disease    hypo    Past Surgical History:  Procedure Laterality Date  . COLONOSCOPY  2014  . FEMUR IM NAIL Left 10/11/2016   Procedure: INTRAMEDULLARY (IM) NAIL FEMORAL;  Surgeon: [name], MD;  Location: [facility] ORS;  Service: Orthopedics;  Laterality: Left;  . HAND SURGERY   1973 / 2010 / 2015   right to release tendons  . HARDWARE REMOVAL Left 10/05/2017   Procedure: Removal of left femoral nail;  Surgeon: [name], MD;  Location: [facility];  Service: Orthopedics;  Laterality: Left;  90 mins  . KNEE ARTHROSCOPY  1984 & 2001   twice  . NASAL SEPTUM SURGERY    . removal Left femoral nail      10/05/17 Dr. [name]  . [place] & 2006   Right and left  . TONSILLECTOMY    . TOTAL KNEE ARTHROPLASTY Right 01/09/2015   Procedure: RIGHT TOTAL KNEE ARTHROPLASTY;  Surgeon: [name], MD;  Location: [facility];  Service: Orthopedics;  Laterality: Right;  . TOTAL KNEE ARTHROPLASTY Left 11/23/2017   Procedure: LEFT TOTAL KNEE ARTHROPLASTY;  Surgeon: [name], MD;  Location: [facility];  Service: Orthopedics;  Laterality: Left;  90 mins    Current Medications: Current Meds  Medication Sig  . apixaban (ELIQUIS) 5 MG TABS tablet Take 5 mg by mouth 2 (two) times daily.  Marland Kitchen atorvastatin (LIPITOR) 20 MG tablet Take 20 mg by mouth daily.  . Cholecalciferol (VITAMIN D) 2000 UNITS CAPS Take 2,000 Units by mouth daily at 12 noon.   . cyclobenzaprine (FLEXERIL) 5 MG tablet Take 5 mg by mouth 3 (three) times daily as needed for muscle spasms.  Marland Kitchen docusate sodium (COLACE) 100 MG capsule Take 1 capsule (100 mg total) by mouth 2 (two) times daily.  Marland Kitchen gabapentin (NEURONTIN) 300 MG capsule Take 300  mg by mouth 2 (two) times daily.  Marland Kitchen HYDROcodone-acetaminophen (NORCO) 7.5-325 MG tablet Take 1-2 tablets by mouth every 4 (four) hours as needed for moderate pain or severe pain.  Marland Kitchen levothyroxine (SYNTHROID, LEVOTHROID) 150 MCG tablet Take 150 mcg by mouth daily before breakfast.  . metoprolol tartrate (LOPRESSOR) 50 MG tablet Take 1 tablet (50 mg total) by mouth 2 (two) times daily.  . Multiple Vitamin (MULTIVITAMIN WITH MINERALS) TABS tablet Take 1 tablet by mouth daily at 12 noon.   . Multiple Vitamins-Minerals (PRESERVISION AREDS 2 PO) Take 2 tablets by mouth daily at 12 noon.  . pantoprazole (PROTONIX) 40 MG tablet Take 40 mg by mouth daily.  . polycarbophil (FIBERCON) 625 MG tablet Take 1,250 mg by mouth daily at 12 noon.   . polyethylene glycol (MIRALAX / GLYCOLAX) packet Take 17 g by mouth 2 (two) times daily.  . valsartan-hydrochlorothiazide (DIOVAN-HCT) 160-25 MG tablet Take 1 tablet by mouth daily.  Marland Kitchen venlafaxine XR (EFFEXOR-XR) 75 MG 24 hr capsule Take 75 mg by mouth daily with breakfast.     Allergies:   Sulfa antibiotics   Social History   Socioeconomic History  . Marital status: Married    Spouse name: None  . Number of children: None  . Years of education: None  . Highest education level: None  Social Needs  . Financial resource strain: None  . Food insecurity - worry: None  . Food insecurity - inability: None  . Transportation needs - medical: None  . Transportation needs - non-medical: None  Occupational History  . Occupation: retired, has degree in physiology  Tobacco Use  . Smoking status: Former Smoker    Last attempt to quit: 12/29/1968    Years since quitting: 49.0  . Smokeless tobacco: Never Used  Substance and Sexual Activity  . Alcohol use: Yes    Alcohol/week: 7.2 oz    Types: 12 Cans of beer per week    Comment: occasiona  . Drug use: No  . Sexual activity: Yes  Other Topics Concern  . None  Social History Narrative   Pt lives in [place] w/  wife.     Family History:  The patient's family history includes Breast cancer in his mother.   ROS:   Please see the history of present illness.    Review of Systems  Constitution: Negative.  HENT: Negative.   Cardiovascular: Positive for irregular heartbeat.  Respiratory: Negative.   Endocrine: Negative.   Hematologic/Lymphatic: Negative.   Musculoskeletal: Positive for arthritis, joint pain, joint swelling, myalgias and stiffness.  Gastrointestinal: Negative.   Genitourinary: Negative.   Neurological: Negative.    All other systems reviewed and are negative.   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 Neck: no JVD, carotid bruits, or masses Cardiac: Irregular irregular; no murmurs, rubs, or gallops  Respiratory:  clear to auscultation bilaterally, normal work of breathing GI: soft, nontender, nondistended, + BS Ext: without cyanosis, clubbing, significant edema and erythema and warmth of left knee with no discharge, Good distal pulses bilaterally Neuro:  Alert and Oriented x 3 Psych: euthymic mood, full affect  Wt Readings from Last 3 Encounters:  01/08/18 203 lb 6.4 oz (92.3 kg)  11/23/17 221 lb (100.2 kg)  11/17/17 221 lb (100.2 kg)    Studies/Labs Reviewed:   EKG:  EKG is  ordered today 01/08/2018 it shows atrial fibrillation with ventricular rate 97 bpm, PVC that is new, this was personally reviewed.  Recent Labs: 11/24/2017: BUN 13; Creatinine, Ser 0.65; Hemoglobin 11.4; Platelets 188; Potassium 3.4; Sodium 134   Lipid Panel    Component Value Date/Time   CHOL 192 10/11/2016 0337   TRIG 471 (H) 10/11/2016 0337   HDL 51 10/11/2016 0337   CHOLHDL 3.8 10/11/2016 0337   VLDL UNABLE TO CALCULATE IF TRIGLYCERIDE OVER 400 mg/dL 10/11/2016 0337   LDLCALC UNABLE TO  CALCULATE IF TRIGLYCERIDE OVER 400 mg/dL 10/11/2016 1443   Additional studies/ records that were reviewed today include:   2D Echo 10/05/17  - Left ventricle: The cavity size was normal. Wall thickness was   increased in a pattern of mild LVH. Systolic function was normal.   The estimated ejection fraction was in the range of 55% to 60%.   Wall motion was normal; there were no regional wall motion   abnormalities. - Left atrium: The atrium was mildly dilated.   Impressions: - Normal LV function; mild LVH; mild LAE.    Patient Profile     JAKHAI FANT is a 72 y.o. 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 He had a mechanical fall 09/2016 resulting in hip fracture, treated with surgery with left femoral nail. Unfortunately, he has had significant pain from the nail and came in for elective removal. During surgery, pt was noted to go into afib w/ RVR.    ASSESSMENT:    1. Atrial fibrillation with controlled ventricular rate ([place])   2. Essential hypertension   3. Hyperlipidemia, unspecified hyperlipidemia type   4. Bruit      PLAN:  In order of problems listed above:  1. Atrial fibrillation in the setting of femoral nail removal new for patient started on metoprolol and Eliquis 5 mg BID.  He has been very compliant to his anticoagulation and has no bleeding. Because of possible TIA we will plan for TEE prior to cardioversion next week.  2. Essential hypertension blood pressure well controlled, continue current regimen  3. Hyperlipidemia on atorvastatin 20 mg daily that is tolerated well.  4. Left carotid bruit - subjective data the patient, not heard on physical exam, we will obtain bilateral carotid ultrasound.  5. Status post knee replacement, possible artificial joint infection, we will obtain CBC and sedimentation rate.  TEE/DCCV the next week, follow up in 3 months.  Medication Adjustments/Labs and Tests  Ordered: Current medicines are reviewed at length with the patient today.  Concerns regarding medicines are outlined above.  Medication changes, Labs  and Tests ordered today are listed in the Patient Instructions below. There are no Patient Instructions on file for this visit.   Signed, Ena Dawley, MD  01/08/2018 10:27 AM    [address], [place], [place]  [address] Phone: [phone]; Fax: [phone]

## 2018-01-08 NOTE — Patient Instructions (Addendum)
Medication Instructions:   Your physician recommends that you continue on your current medications as directed. Please refer to the Current Medication list given to you today.    Labwork:  TODAY---CMET, MAGNESIUM, CBC W DIFF, TSH, PRO-BNP, AND ESR (SEDIMENTATION RATE)    Testing/Procedures:  Your physician has requested that you have a TEE/Cardioversion. During a TEE, sound waves are used to create images of your heart. It provides your doctor with information about the size and shape of your heart and how well your heart's chambers and valves are working. In this test, a transducer is attached to the end of a flexible tube that is guided down you throat and into your esophagus (the tube leading from your mouth to your stomach) to get a more detailed image of your heart. Once the TEE has determined that a blood clot is not present, the cardioversion begins. Electrical Cardioversion uses a jolt of electricity to your heart either through paddles or wired patches attached to your chest. This is a controlled, usually prescheduled, procedure. This procedure is done at the hospital and you are not awake during the procedure. You usually go home the day of the procedure. Please see the instruction sheet given to you today for more information. YOUR TEE/CARDIOVERSION IS SCHEDULED FOR NEXT Tuesday 01/12/18 AT 12:00 PM FOR DR NELSON TO DO--PLEASE ARRIVE AT [facility] AT 12 PM ON THIS DAY--PLEASE FOLLOW INSTRUCTIONS PROVIDED, TO PREPARE FOR THIS PROCEDURE   Your physician has requested that you have a carotid duplex. This test is an ultrasound of the carotid arteries in your neck. It looks at blood flow through these arteries that supply the brain with blood. Allow one hour for this exam. There are no restrictions or special instructions.     Follow-Up:  3 MONTHS WITH DR [name] OR AN APP ON HER TEAM     If you need a refill on your cardiac medications before your next appointment,  please call your pharmacy.

## 2018-01-08 NOTE — H&P (View-Only) (Signed)
Cardiology Office Note    Date:  01/08/2018   ID:  ATHANASIOS HELDMAN, DOB Jan 17, 1946, MRN [id number]  PCP:  [name], PA  Cardiologist: Dr. [name]  Chief complain: DOE, left knee swelling  History of Present Illness:  JOANN KULPA is a 72 y.o. male with history of hypertension, HLD, hypothyroidism. He underwent surgery for left femoral nail removal. During surgery he went into atrial fib with RVR which was new for him. 2-D echo showed normal LV function with mildly dilated left atrium.CHADSVASC=2 for hypertension and age and he was started on Eliquis 5 mg BID with plans to cardiovert 4 weeks.  10/13/17 - Patient comes in today accompanied by his wife. His total knee replacement scheduled for November 26. He doesn't feel good and atrial fibrillation since his medications were changed. His blood pressure is running low. He can't feel his heart beating irregular unless he takes his pulse. No fast heart rates. Denies chest pain and was fairly active until he had the nail removed from his hip.  01/08/2018 - this is 3 months follow-up, the patient underwent left knee replacement on 11/24/2017, he completed 3 weeks of Keflex, he denies any fever or chills however has experienced swelling and erythema since the surgery that gets significantly worse since last week. His currently unable to flex his knee or walk properly. He denies any chest pain, or palpitations however with minimal exertion he gets short of breath such as walking flight of stairs. He has been using Eliquis without any interruptions and denies any bleeding. The patient describes paresthesias and numbness in 3 of his right fingers that resolved and his morning. The patient is also complaining of bruit in his left ear.  Past Medical History:  Diagnosis Date  . Anemia    hx of one time  . Arthritis    OA  . Atrial fibrillation with controlled ventricular rate ([place]) 10/05/2017  . Difficulty sleeping   . Diverticulosis  2014  . 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 GERD (gastroesophageal reflux disease)   . GI symptom    had nausea / vomited once / frequent stools / getting better  . Hyperlipidemia   . Hypertension   . Mood changes   . Pneumonia    hx of  . Thyroid disease    hypo    Past Surgical History:  Procedure Laterality Date  . COLONOSCOPY  2014  . FEMUR IM NAIL Left 10/11/2016   Procedure: INTRAMEDULLARY (IM) NAIL FEMORAL;  Surgeon: [name], MD;  Location: [facility] ORS;  Service: Orthopedics;  Laterality: Left;  . HAND SURGERY   1973 / 2010 / 2015   right to release tendons  . HARDWARE REMOVAL Left 10/05/2017   Procedure: Removal of left femoral nail;  Surgeon: [name], MD;  Location: [facility];  Service: Orthopedics;  Laterality: Left;  90 mins  . KNEE ARTHROSCOPY  1984 & 2001   twice  . NASAL SEPTUM SURGERY    . removal Left femoral nail      10/05/17 Dr. [name]  . [place] & 2006   Right and left  . TONSILLECTOMY    . TOTAL KNEE ARTHROPLASTY Right 01/09/2015   Procedure: RIGHT TOTAL KNEE ARTHROPLASTY;  Surgeon: [name], MD;  Location: [facility];  Service: Orthopedics;  Laterality: Right;  . TOTAL KNEE ARTHROPLASTY Left 11/23/2017   Procedure: LEFT TOTAL KNEE ARTHROPLASTY;  Surgeon: [name], MD;  Location: [facility];  Service: Orthopedics;  Laterality: Left;  90 mins    Current Medications: Current Meds  Medication Sig  . apixaban (ELIQUIS) 5 MG TABS tablet Take 5 mg by mouth 2 (two) times daily.  Marland Kitchen atorvastatin (LIPITOR) 20 MG tablet Take 20 mg by mouth daily.  . Cholecalciferol (VITAMIN D) 2000 UNITS CAPS Take 2,000 Units by mouth daily at 12 noon.   . cyclobenzaprine (FLEXERIL) 5 MG tablet Take 5 mg by mouth 3 (three) times daily as needed for muscle spasms.  Marland Kitchen docusate sodium (COLACE) 100 MG capsule Take 1 capsule (100 mg total) by mouth 2 (two) times daily.  Marland Kitchen gabapentin (NEURONTIN) 300 MG capsule Take 300  mg by mouth 2 (two) times daily.  Marland Kitchen HYDROcodone-acetaminophen (NORCO) 7.5-325 MG tablet Take 1-2 tablets by mouth every 4 (four) hours as needed for moderate pain or severe pain.  Marland Kitchen levothyroxine (SYNTHROID, LEVOTHROID) 150 MCG tablet Take 150 mcg by mouth daily before breakfast.  . metoprolol tartrate (LOPRESSOR) 50 MG tablet Take 1 tablet (50 mg total) by mouth 2 (two) times daily.  . Multiple Vitamin (MULTIVITAMIN WITH MINERALS) TABS tablet Take 1 tablet by mouth daily at 12 noon.   . Multiple Vitamins-Minerals (PRESERVISION AREDS 2 PO) Take 2 tablets by mouth daily at 12 noon.  . pantoprazole (PROTONIX) 40 MG tablet Take 40 mg by mouth daily.  . polycarbophil (FIBERCON) 625 MG tablet Take 1,250 mg by mouth daily at 12 noon.   . polyethylene glycol (MIRALAX / GLYCOLAX) packet Take 17 g by mouth 2 (two) times daily.  . valsartan-hydrochlorothiazide (DIOVAN-HCT) 160-25 MG tablet Take 1 tablet by mouth daily.  Marland Kitchen venlafaxine XR (EFFEXOR-XR) 75 MG 24 hr capsule Take 75 mg by mouth daily with breakfast.     Allergies:   Sulfa antibiotics   Social History   Socioeconomic History  . Marital status: Married    Spouse name: None  . Number of children: None  . Years of education: None  . Highest education level: None  Social Needs  . Financial resource strain: None  . Food insecurity - worry: None  . Food insecurity - inability: None  . Transportation needs - medical: None  . Transportation needs - non-medical: None  Occupational History  . Occupation: retired, has degree in physiology  Tobacco Use  . Smoking status: Former Smoker    Last attempt to quit: 12/29/1968    Years since quitting: 49.0  . Smokeless tobacco: Never Used  Substance and Sexual Activity  . Alcohol use: Yes    Alcohol/week: 7.2 oz    Types: 12 Cans of beer per week    Comment: occasiona  . Drug use: No  . Sexual activity: Yes  Other Topics Concern  . None  Social History Narrative   Pt lives in [place] w/  wife.     Family History:  The patient's family history includes Breast cancer in his mother.   ROS:   Please see the history of present illness.    Review of Systems  Constitution: Negative.  HENT: Negative.   Cardiovascular: Positive for irregular heartbeat.  Respiratory: Negative.   Endocrine: Negative.   Hematologic/Lymphatic: Negative.   Musculoskeletal: Positive for arthritis, joint pain, joint swelling, myalgias and stiffness.  Gastrointestinal: Negative.   Genitourinary: Negative.   Neurological: Negative.    All other systems reviewed and are negative.   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 Neck: no JVD, carotid bruits, or masses Cardiac: Irregular irregular; no murmurs, rubs, or gallops  Respiratory:  clear to auscultation bilaterally, normal work of breathing GI: soft, nontender, nondistended, + BS Ext: without cyanosis, clubbing, significant edema and erythema and warmth of left knee with no discharge, Good distal pulses bilaterally Neuro:  Alert and Oriented x 3 Psych: euthymic mood, full affect  Wt Readings from Last 3 Encounters:  01/08/18 203 lb 6.4 oz (92.3 kg)  11/23/17 221 lb (100.2 kg)  11/17/17 221 lb (100.2 kg)    Studies/Labs Reviewed:   EKG:  EKG is  ordered today 01/08/2018 it shows atrial fibrillation with ventricular rate 97 bpm, PVC that is new, this was personally reviewed.  Recent Labs: 11/24/2017: BUN 13; Creatinine, Ser 0.65; Hemoglobin 11.4; Platelets 188; Potassium 3.4; Sodium 134   Lipid Panel    Component Value Date/Time   CHOL 192 10/11/2016 0337   TRIG 471 (H) 10/11/2016 0337   HDL 51 10/11/2016 0337   CHOLHDL 3.8 10/11/2016 0337   VLDL UNABLE TO CALCULATE IF TRIGLYCERIDE OVER 400 mg/dL 10/11/2016 0337   LDLCALC UNABLE TO  CALCULATE IF TRIGLYCERIDE OVER 400 mg/dL 10/11/2016 0109   Additional studies/ records that were reviewed today include:   2D Echo 10/05/17  - Left ventricle: The cavity size was normal. Wall thickness was   increased in a pattern of mild LVH. Systolic function was normal.   The estimated ejection fraction was in the range of 55% to 60%.   Wall motion was normal; there were no regional wall motion   abnormalities. - Left atrium: The atrium was mildly dilated.   Impressions: - Normal LV function; mild LVH; mild LAE.    Patient Profile     LYNDALL WINDT is a 72 y.o. 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 He had a mechanical fall 09/2016 resulting in hip fracture, treated with surgery with left femoral nail. Unfortunately, he has had significant pain from the nail and came in for elective removal. During surgery, pt was noted to go into afib w/ RVR.    ASSESSMENT:    1. Atrial fibrillation with controlled ventricular rate ([place])   2. Essential hypertension   3. Hyperlipidemia, unspecified hyperlipidemia type   4. Bruit      PLAN:  In order of problems listed above:  1. Atrial fibrillation in the setting of femoral nail removal new for patient started on metoprolol and Eliquis 5 mg BID.  He has been very compliant to his anticoagulation and has no bleeding. Because of possible TIA we will plan for TEE prior to cardioversion next week.  2. Essential hypertension blood pressure well controlled, continue current regimen  3. Hyperlipidemia on atorvastatin 20 mg daily that is tolerated well.  4. Left carotid bruit - subjective data the patient, not heard on physical exam, we will obtain bilateral carotid ultrasound.  5. Status post knee replacement, possible artificial joint infection, we will obtain CBC and sedimentation rate.  TEE/DCCV the next week, follow up in 3 months.  Medication Adjustments/Labs and Tests  Ordered: Current medicines are reviewed at length with the patient today.  Concerns regarding medicines are outlined above.  Medication changes, Labs  and Tests ordered today are listed in the Patient Instructions below. There are no Patient Instructions on file for this visit.   Signed, Ena Dawley, MD  01/08/2018 10:27 AM    [address], [place], [place]  [address] Phone: [phone]; Fax: [phone]

## 2018-01-09 LAB — CBC WITH DIFFERENTIAL/PLATELET
Basophils Absolute: 0 10*3/uL (ref 0.0–0.2)
Basos: 0 %
EOS (ABSOLUTE): 0.3 10*3/uL (ref 0.0–0.4)
Eos: 2 %
Hematocrit: 41.3 % (ref 37.5–51.0)
Hemoglobin: 13.9 g/dL (ref 13.0–17.7)
Immature Grans (Abs): 0 10*3/uL (ref 0.0–0.1)
Immature Granulocytes: 0 %
Lymphocytes Absolute: 2.3 10*3/uL (ref 0.7–3.1)
Lymphs: 16 %
MCH: 33.3 pg — ABNORMAL HIGH (ref 26.6–33.0)
MCHC: 33.7 g/dL (ref 31.5–35.7)
MCV: 99 fL — ABNORMAL HIGH (ref 79–97)
Monocytes Absolute: 1.2 10*3/uL — ABNORMAL HIGH (ref 0.1–0.9)
Monocytes: 9 %
Neutrophils Absolute: 10.7 10*3/uL — ABNORMAL HIGH (ref 1.4–7.0)
Neutrophils: 73 %
Platelets: 443 10*3/uL — ABNORMAL HIGH (ref 150–379)
RBC: 4.18 x10E6/uL (ref 4.14–5.80)
RDW: 13.8 % (ref 12.3–15.4)
WBC: 14.7 10*3/uL — ABNORMAL HIGH (ref 3.4–10.8)

## 2018-01-09 LAB — COMPREHENSIVE METABOLIC PANEL
ALT: 23 IU/L (ref 0–44)
AST: 21 IU/L (ref 0–40)
Albumin/Globulin Ratio: 1.6 (ref 1.2–2.2)
Albumin: 4.3 g/dL (ref 3.5–4.8)
Alkaline Phosphatase: 106 IU/L (ref 39–117)
BUN/Creatinine Ratio: 18 (ref 10–24)
BUN: 13 mg/dL (ref 8–27)
Bilirubin Total: 0.6 mg/dL (ref 0.0–1.2)
CO2: 25 mmol/L (ref 20–29)
Calcium: 9.9 mg/dL (ref 8.6–10.2)
Chloride: 96 mmol/L (ref 96–106)
Creatinine, Ser: 0.73 mg/dL — ABNORMAL LOW (ref 0.76–1.27)
GFR calc Af Amer: 107 mL/min/{1.73_m2} (ref >59)
GFR calc non Af Amer: 93 mL/min/{1.73_m2} (ref >59)
Globulin, Total: 2.7 g/dL (ref 1.5–4.5)
Glucose: 126 mg/dL — ABNORMAL HIGH (ref 65–99)
Potassium: 4.2 mmol/L (ref 3.5–5.2)
Sodium: 137 mmol/L (ref 134–144)
Total Protein: 7 g/dL (ref 6.0–8.5)

## 2018-01-09 LAB — MAGNESIUM: Magnesium: 1.8 mg/dL (ref 1.6–2.3)

## 2018-01-09 LAB — SEDIMENTATION RATE: Sed Rate: 12 mm/hr (ref 0–30)

## 2018-01-09 LAB — PRO B NATRIURETIC PEPTIDE: NT-Pro BNP: 983 pg/mL — ABNORMAL HIGH (ref 0–376)

## 2018-01-09 LAB — TSH: TSH: 1.52 u[IU]/mL (ref 0.450–4.500)

## 2018-01-11 ENCOUNTER — Telehealth: Payer: Self-pay | Admitting: *Deleted

## 2018-01-11 DIAGNOSIS — R7989 Other specified abnormal findings of blood chemistry: Secondary | ICD-10-CM | POA: Insufficient documentation

## 2018-01-11 MED ORDER — FUROSEMIDE 40 MG PO TABS: 40.0000 mg | ORAL_TABLET | Freq: Every day | ORAL | 0 refills | Status: DC

## 2018-01-11 NOTE — Telephone Encounter (Signed)
-----   Message from Dorothy Spark, MD sent at 01/11/2018  1:22 PM EST ----- Signs of infection - with elevated WBC, also elevated BNP, start lasix 40 mg po daily x 2 weeks. Repeat BMP and BNP at that time.

## 2018-01-11 NOTE — Telephone Encounter (Signed)
Spoke with the pt and informed him that per Dr [name], his labs showed signs of infection, with elevated WBC, also elevated BNP, and she recommends that he start taking lasix 40 mg po daily x 2 weeks, and repeat a BMET and PRO-BNP at that time.  Confirmed the pharmacy of choice with the pt.  Scheduled the pt a lab appt for 01/25/18, to recheck a bmet and pro-bnp.  Informed the pt that per Dr [name], we will proceed with his scheduled TEE/DCCV for tomorrow.  Pt verbalized understanding and agrees with this plan.

## 2018-01-12 ENCOUNTER — Encounter (HOSPITAL_COMMUNITY)
Admission: RE | Disposition: A | Discharge status: Home / Self Care | Payer: Self-pay | Source: Ambulatory Visit | Attending: Cardiology

## 2018-01-12 ENCOUNTER — Ambulatory Visit (HOSPITAL_BASED_OUTPATIENT_CLINIC_OR_DEPARTMENT_OTHER)
Admission: RE | Admit: 2018-01-12 | Discharge: 2018-01-12 | Disposition: A | Discharge status: Home / Self Care | Payer: Medicare HMO | Source: Ambulatory Visit | Attending: Cardiology | Admitting: Cardiology

## 2018-01-12 ENCOUNTER — Telehealth: Payer: Self-pay | Admitting: *Deleted

## 2018-01-12 ENCOUNTER — Ambulatory Visit (HOSPITAL_COMMUNITY)
Admission: RE | Admit: 2018-01-12 | Discharge: 2018-01-12 | Disposition: A | Discharge status: Home / Self Care | Payer: Medicare HMO | Source: Ambulatory Visit | Attending: Cardiology | Admitting: Cardiology

## 2018-01-12 ENCOUNTER — Ambulatory Visit (HOSPITAL_COMMUNITY): Payer: Medicare HMO | Admitting: Anesthesiology

## 2018-01-12 ENCOUNTER — Encounter (HOSPITAL_COMMUNITY): Payer: Self-pay | Admitting: *Deleted

## 2018-01-12 ENCOUNTER — Other Ambulatory Visit: Payer: Self-pay

## 2018-01-12 DIAGNOSIS — Z96653 Presence of artificial knee joint, bilateral: Secondary | ICD-10-CM | POA: Diagnosis not present

## 2018-01-12 DIAGNOSIS — K219 Gastro-esophageal reflux disease without esophagitis: Secondary | ICD-10-CM | POA: Diagnosis not present

## 2018-01-12 DIAGNOSIS — I4891 Unspecified atrial fibrillation: Secondary | ICD-10-CM | POA: Diagnosis not present

## 2018-01-12 DIAGNOSIS — I340 Nonrheumatic mitral (valve) insufficiency: Secondary | ICD-10-CM

## 2018-01-12 DIAGNOSIS — K5790 Diverticulosis of intestine, part unspecified, without perforation or abscess without bleeding: Secondary | ICD-10-CM | POA: Insufficient documentation

## 2018-01-12 DIAGNOSIS — E785 Hyperlipidemia, unspecified: Secondary | ICD-10-CM | POA: Diagnosis not present

## 2018-01-12 DIAGNOSIS — R0989 Other specified symptoms and signs involving the circulatory and respiratory systems: Secondary | ICD-10-CM | POA: Insufficient documentation

## 2018-01-12 DIAGNOSIS — D649 Anemia, unspecified: Secondary | ICD-10-CM | POA: Diagnosis not present

## 2018-01-12 DIAGNOSIS — E039 Hypothyroidism, unspecified: Secondary | ICD-10-CM | POA: Diagnosis not present

## 2018-01-12 DIAGNOSIS — Z79899 Other long term (current) drug therapy: Secondary | ICD-10-CM | POA: Diagnosis not present

## 2018-01-12 DIAGNOSIS — Z7901 Long term (current) use of anticoagulants: Secondary | ICD-10-CM | POA: Insufficient documentation

## 2018-01-12 DIAGNOSIS — M1990 Unspecified osteoarthritis, unspecified site: Secondary | ICD-10-CM | POA: Diagnosis not present

## 2018-01-12 DIAGNOSIS — Z87891 Personal history of nicotine dependence: Secondary | ICD-10-CM | POA: Diagnosis not present

## 2018-01-12 DIAGNOSIS — Z882 Allergy status to sulfonamides status: Secondary | ICD-10-CM | POA: Diagnosis not present

## 2018-01-12 DIAGNOSIS — I10 Essential (primary) hypertension: Secondary | ICD-10-CM | POA: Insufficient documentation

## 2018-01-12 DIAGNOSIS — J189 Pneumonia, unspecified organism: Secondary | ICD-10-CM | POA: Diagnosis not present

## 2018-01-12 HISTORY — PX: TEE WITHOUT CARDIOVERSION: SHX5443

## 2018-01-12 HISTORY — PX: TEE WITH CARDIOVERSION: SHX5442

## 2018-01-12 HISTORY — PX: CARDIOVERSION: SHX1299

## 2018-01-12 SURGERY — ECHOCARDIOGRAM, TRANSESOPHAGEAL
Anesthesia: General

## 2018-01-12 MED ORDER — PROPOFOL 500 MG/50ML IV EMUL
INTRAVENOUS | Status: DC | PRN
  Administered 2018-01-12: 100 ug/kg/min via INTRAVENOUS

## 2018-01-12 MED ORDER — SODIUM CHLORIDE 0.9% FLUSH: 3.0000 mL | INTRAVENOUS | Status: DC | PRN

## 2018-01-12 MED ORDER — SODIUM CHLORIDE 0.9 % IV SOLN: INTRAVENOUS | Status: DC

## 2018-01-12 MED ORDER — SODIUM CHLORIDE 0.9% FLUSH: 3.0000 mL | Freq: Two times a day (BID) | INTRAVENOUS | Status: DC

## 2018-01-12 MED ORDER — SODIUM CHLORIDE 0.9 % IV SOLN: 250.0000 mL | INTRAVENOUS | Status: DC

## 2018-01-12 MED ORDER — PROPOFOL 10 MG/ML IV BOLUS
INTRAVENOUS | Status: DC | PRN
  Administered 2018-01-12 (×2): 20 mg via INTRAVENOUS
  Administered 2018-01-12 (×2): 10 mg via INTRAVENOUS

## 2018-01-12 MED ORDER — LACTATED RINGERS IV SOLN
INTRAVENOUS | Status: DC
  Administered 2018-01-12: 1000 mL via INTRAVENOUS

## 2018-01-12 NOTE — CV Procedure (Signed)
   Transesophageal Echocardiogram Note  Eugene White [id number] 05-Oct-1946  Procedure: Transesophageal Echocardiogram Indications: atrial fibrillation  Procedure Details Consent: Obtained Time Out: Verified patient identification, verified procedure, site/side was marked, verified correct patient position, special equipment/implants available, Radiology Safety Procedures followed,  medications/allergies/relevent history reviewed, required imaging and test results available.  Performed  Medications: IV propofol administered by anesthesia staff  No intracardiac source of embolism.  Complications: No apparent complications Patient did tolerate procedure well.  Ena Dawley, MD, [facility] 01/12/2018, 2:50 PM    Cardioversion Note  Eugene White [id number] 1946/10/10  Procedure: DC Cardioversion Indications: atrial fibrillation  Procedure Details Consent: Obtained Time Out: Verified patient identification, verified procedure, site/side was marked, verified correct patient position, special equipment/implants available, Radiology Safety Procedures followed,  medications/allergies/relevent history reviewed, required imaging and test results available.  Performed  The patient has been on adequate anticoagulation.  The patient received IV propofol administered by anesthesia staff for sedation.  Synchronous cardioversion was performed at 120 joules.  The cardioversion was successful.  Complications: No apparent complications Patient did tolerate procedure well.  Ena Dawley, MD, [facility] 01/12/2018, 2:50 PM

## 2018-01-12 NOTE — Anesthesia Postprocedure Evaluation (Signed)
Anesthesia Post Note  Patient: Eugene White  Procedure(s) Performed: TRANSESOPHAGEAL ECHOCARDIOGRAM (TEE) (N/A ) CARDIOVERSION (N/A )     Patient location during evaluation: Endoscopy Anesthesia Type: General Level of consciousness: awake and alert Pain management: pain level controlled Vital Signs Assessment: post-procedure vital signs reviewed and stable Respiratory status: spontaneous breathing, nonlabored ventilation, respiratory function stable and patient connected to nasal cannula oxygen Cardiovascular status: blood pressure returned to baseline and stable Postop Assessment: no apparent nausea or vomiting Anesthetic complications: no    Last Vitals:  Vitals:   01/12/18 1457 01/12/18 1500  BP: 132/89   Pulse: 74 76  Resp: 13 20  Temp:    SpO2: 98% 98%    Last Pain:  Vitals:   01/12/18 1430  TempSrc: Oral                 Josee Speece COKER

## 2018-01-12 NOTE — Interval H&P Note (Signed)
History and Physical Interval Note:  01/12/2018 12:40 PM  Eugene White  has presented today for surgery, with the diagnosis of AFIB  The various methods of treatment have been discussed with the patient and family. After consideration of risks, benefits and other options for treatment, the patient has consented to  Procedure(s): TRANSESOPHAGEAL ECHOCARDIOGRAM (TEE) (N/A) CARDIOVERSION (N/A) as a surgical intervention .  The patient's history has been reviewed, patient examined, no change in status, stable for surgery.  I have reviewed the patient's chart and labs.  Questions were answered to the patient's satisfaction.     Ena Dawley

## 2018-01-12 NOTE — Anesthesia Preprocedure Evaluation (Signed)
Anesthesia Evaluation  Patient identified by MRN, date of birth, ID band Patient awake    Reviewed: Allergy & Precautions, NPO status , Patient's Chart, lab work & pertinent test results  Airway Mallampati: II  TM Distance: >3 FB Neck ROM: Full    Dental  (+) Teeth Intact, Dental Advisory Given   Pulmonary former smoker,    breath sounds clear to auscultation       Cardiovascular hypertension,  Rhythm:Irregular Rate:Normal     Neuro/Psych    GI/Hepatic   Endo/Other    Renal/GU      Musculoskeletal   Abdominal   Peds  Hematology   Anesthesia Other Findings   Reproductive/Obstetrics                             Anesthesia Physical Anesthesia Plan  ASA: III  Anesthesia Plan: General   Post-op Pain Management:    Induction: Intravenous  PONV Risk Score and Plan:   Airway Management Planned: Mask  Additional Equipment:   Intra-op Plan:   Post-operative Plan:   Informed Consent: I have reviewed the patients History and Physical, chart, labs and discussed the procedure including the risks, benefits and alternatives for the proposed anesthesia with the patient or authorized representative who has indicated his/her understanding and acceptance.   Dental advisory given  Plan Discussed with: CRNA and Anesthesiologist  Anesthesia Plan Comments:         Anesthesia Quick Evaluation

## 2018-01-12 NOTE — Transfer of Care (Signed)
Immediate Anesthesia Transfer of Care Note  Patient: Eugene White  Procedure(s) Performed: TRANSESOPHAGEAL ECHOCARDIOGRAM (TEE) (N/A ) CARDIOVERSION (N/A )  Patient Location: Endoscopy Unit  Anesthesia Type:General  Level of Consciousness: awake, oriented and patient cooperative  Airway & Oxygen Therapy: Patient Spontanous Breathing and Patient connected to nasal cannula oxygen  Post-op Assessment: Report given to RN, Post -op Vital signs reviewed and stable and Patient moving all extremities  Post vital signs: Reviewed and stable  Last Vitals:  Vitals:   01/12/18 1313  BP: (!) 145/97  Pulse: 97  Resp: 20  Temp: 37 C  SpO2: 98%    Last Pain:  Vitals:   01/12/18 1313  TempSrc: Oral         Complications: No apparent anesthesia complications

## 2018-01-12 NOTE — Progress Notes (Signed)
  Echocardiogram Echocardiogram Transesophageal has been performed.  Eugene White 01/12/2018, 2:49 PM

## 2018-01-12 NOTE — Telephone Encounter (Signed)
-----   Message from Dorothy Spark, MD sent at 01/12/2018  4:28 PM EST ----- Regarding: Referral to orthopedic surgery Hi Ivy, Could you please forward his labs from last week to Dr Coy Saunas at the [facility] orthopedics and states that there is concern for knee infection? Thank you, [place] Siren

## 2018-01-12 NOTE — Discharge Instructions (Signed)

## 2018-01-13 ENCOUNTER — Encounter (HOSPITAL_COMMUNITY): Payer: Self-pay | Admitting: Cardiology

## 2018-01-14 ENCOUNTER — Ambulatory Visit (HOSPITAL_COMMUNITY)
Admission: RE | Admit: 2018-01-14 | Discharge: 2018-01-14 | Disposition: A | Discharge status: Home / Self Care | Payer: Medicare HMO | Source: Ambulatory Visit | Attending: Cardiology | Admitting: Cardiology

## 2018-01-14 DIAGNOSIS — R0989 Other specified symptoms and signs involving the circulatory and respiratory systems: Secondary | ICD-10-CM

## 2018-01-15 DIAGNOSIS — M25462 Effusion, left knee: Secondary | ICD-10-CM | POA: Diagnosis not present

## 2018-01-15 DIAGNOSIS — Z96652 Presence of left artificial knee joint: Secondary | ICD-10-CM | POA: Diagnosis not present

## 2018-01-15 DIAGNOSIS — Z471 Aftercare following joint replacement surgery: Secondary | ICD-10-CM | POA: Diagnosis not present

## 2018-01-18 ENCOUNTER — Other Ambulatory Visit: Payer: Self-pay

## 2018-01-18 ENCOUNTER — Encounter (HOSPITAL_COMMUNITY): Payer: Self-pay | Admitting: *Deleted

## 2018-01-18 NOTE — H&P (Signed)
Eugene White is an 72 y.o. male.    Chief Complaint: Left knee pain and swelling 8 weeks s/p TKA  HPI: Pt is a 72 y.o. male complaining of left knee pain and swelling.  Patient is 8 weeks out from a left TKA.  States he was doing well, but he fell and hyper flexed his knee.  Since that time he has had significant swelling, pressure, pain and decreased ROM.   Prior to the fall he states he has 5-95 degrees, but after ROM is 10-3- degrees.  Aspiration of the knee was performed in the office which yielded minimal return of blood.  He has recent been placed on Eliquis because of a-fib.  Options were discussed and due to the decreased ROM, significant pain, pressure and swelling he wishes to proceed with a I&D of the left knee with evacuation of hematoma.  Risks, benefits and expectations were discussed with the patient. Patient understand the risks, benefits and expectations and wishes to proceed with surgery.    PCP: [name], PA  D/C Plans:       Home   Post-op Meds:       No Rx given  Tranexamic Acid:      To be given - IV   Decadron:      Is to be given  FYI:     ASA  Norco  DME:   Pt already has equipment  PT:   OPPT to contiue    PMH: Past Medical History:  Diagnosis Date  . Anemia    hx of one time  . Arthritis    OA  . Atrial fibrillation with controlled ventricular rate ([place]) 10/05/2017  . Difficulty sleeping   . Diverticulosis 2014  . Dysrhythmia    atrial fib   . 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 GERD (gastroesophageal reflux disease)   . GI symptom    had nausea / vomited once / frequent stools / getting better  . Hyperlipidemia   . Hypertension   . Hypothyroidism   . Mood changes   . Pneumonia    hx of  . Thyroid disease    hypo    PSH: Past Surgical History:  Procedure Laterality Date  . CARDIOVERSION N/A 01/12/2018   Procedure: CARDIOVERSION;  Surgeon: Dorothy Spark, MD;  Location: [facility] ENDOSCOPY;   Service: Cardiovascular;  Laterality: N/A;  . COLONOSCOPY  2014  . FEMUR IM NAIL Left 10/11/2016   Procedure: INTRAMEDULLARY (IM) NAIL FEMORAL;  Surgeon: [name], MD;  Location: [facility] ORS;  Service: Orthopedics;  Laterality: Left;  . HAND SURGERY   1973 / 2010 / 2015   right to release tendons  . HARDWARE REMOVAL Left 10/05/2017   Procedure: Removal of left femoral nail;  Surgeon: [name], MD;  Location: [facility] ORS;  Service: Orthopedics;  Laterality: Left;  90 mins  . KNEE ARTHROSCOPY  1984 & 2001   twice  . NASAL SEPTUM SURGERY    . removal Left femoral nail      10/05/17 Dr. [name]  . [place] & 2006   Right and left  . TEE WITHOUT CARDIOVERSION N/A 01/12/2018   Procedure: TRANSESOPHAGEAL ECHOCARDIOGRAM (TEE);  Surgeon: Dorothy Spark, MD;  Location: [facility] ENDOSCOPY;  Service: Cardiovascular;  Laterality: N/A;  . TONSILLECTOMY    . TOTAL KNEE ARTHROPLASTY Right 01/09/2015   Procedure: RIGHT TOTAL KNEE ARTHROPLASTY;  Surgeon: [name], MD;  Location: [facility] ORS;  Service: Orthopedics;  Laterality: Right;  . TOTAL KNEE ARTHROPLASTY Left 11/23/2017   Procedure: LEFT TOTAL KNEE ARTHROPLASTY;  Surgeon: [name], MD;  Location: [facility] ORS;  Service: Orthopedics;  Laterality: Left;  90 mins    Social History:  reports that he quit smoking about 49 years ago. he has never used smokeless tobacco. He reports that he drinks about 7.2 oz of alcohol per week. He reports that he does not use drugs.  Allergies:  Allergies  Allergen Reactions  . Sulfa Antibiotics Other (See Comments)    Unknown/childhood allergy    Medications: No current facility-administered medications for this encounter.    Current Outpatient Medications  Medication Sig Dispense Refill  . acetaminophen (TYLENOL) 500 MG tablet Take 500 mg by mouth every 8 (eight) hours as needed for moderate pain. With hydrocodone    . apixaban (ELIQUIS) 5 MG TABS tablet Take 5 mg by mouth 2 (two)  times daily.    Marland Kitchen atorvastatin (LIPITOR) 20 MG tablet Take 20 mg by mouth daily at 12 noon.     . Cholecalciferol (VITAMIN D) 2000 UNITS CAPS Take 2,000 Units by mouth daily at 12 noon.     . cyclobenzaprine (FLEXERIL) 5 MG tablet Take 5 mg by mouth 3 (three) times daily as needed for muscle spasms.  0  . diphenhydramine-acetaminophen (TYLENOL PM) 25-500 MG TABS tablet Take 1-2 tablets by mouth at bedtime as needed (sleep).    . docusate sodium (COLACE) 100 MG capsule Take 1 capsule (100 mg total) by mouth 2 (two) times daily. 10 capsule 0  . furosemide (LASIX) 40 MG tablet Take 1 tablet (40 mg total) by mouth daily. Take for 14 days only. 14 tablet 0  . gabapentin (NEURONTIN) 300 MG capsule Take 300 mg by mouth 2 (two) times daily.  1  . HYDROcodone-acetaminophen (NORCO) 7.5-325 MG tablet Take 1-2 tablets by mouth every 4 (four) hours as needed for moderate pain or severe pain. (Patient not taking: Reported on 01/08/2018) 60 tablet 0  . HYDROcodone-acetaminophen (NORCO/VICODIN) 5-325 MG tablet Take 1 tablet by mouth every 8 (eight) hours as needed for moderate pain.    Marland Kitchen levothyroxine (SYNTHROID, LEVOTHROID) 150 MCG tablet Take 150 mcg by mouth daily before breakfast.    . metoprolol tartrate (LOPRESSOR) 50 MG tablet Take 1 tablet (50 mg total) by mouth 2 (two) times daily. 20 tablet 0  . Multiple Vitamin (MULTIVITAMIN WITH MINERALS) TABS tablet Take 1 tablet by mouth daily at 12 noon.     . Multiple Vitamins-Minerals (PRESERVISION AREDS 2 PO) Take 2 tablets by mouth daily at 12 noon.    . pantoprazole (PROTONIX) 40 MG tablet Take 40 mg by mouth daily.    . polycarbophil (FIBERCON) 625 MG tablet Take 1,250 mg by mouth daily at 12 noon.     . polyethylene glycol (MIRALAX / GLYCOLAX) packet Take 17 g by mouth 2 (two) times daily. (Patient not taking: Reported on 01/08/2018) 14 each 0  . valsartan-hydrochlorothiazide (DIOVAN-HCT) 160-25 MG tablet Take 1 tablet by mouth daily.    Marland Kitchen venlafaxine XR  (EFFEXOR-XR) 75 MG 24 hr capsule Take 75 mg by mouth daily with breakfast.         Review of Systems  Constitutional: Negative.   HENT: Negative.   Eyes: Negative.   Respiratory: Negative.   Cardiovascular: Negative.   Gastrointestinal: Positive for heartburn.  Genitourinary: Negative.   Musculoskeletal: Positive for joint pain.  Skin: Negative.   Neurological: Negative.   Endo/Heme/Allergies: Negative.   Psychiatric/Behavioral: Negative.        Physical Exam  Constitutional: He is oriented to person, place, and time. He appears well-developed.  HENT:  Head: Normocephalic.  Eyes: Pupils are equal, round, and reactive to light.  Neck: Neck supple. No JVD present. No tracheal deviation present. No thyromegaly present.  Cardiovascular: Normal rate, regular rhythm and intact distal pulses.  Respiratory: Effort normal and breath sounds normal. No respiratory distress. He has no wheezes.  GI: Soft. There is no tenderness. There is no guarding.  Musculoskeletal:       Left knee: He exhibits decreased range of motion, swelling, effusion, ecchymosis, laceration (healing post-op incision), erythema and bony tenderness. Tenderness found.  Lymphadenopathy:    He has no cervical adenopathy.  Neurological: He is alert and oriented to person, place, and time.  Skin: Skin is warm and dry.  Psychiatric: He has a normal mood and affect.       Assessment/Plan Assessment:  Left knee pain and swelling, hematoma  Plan: Patient will undergo an I&D of the left knee with evacuation of hematoma on 01/19/2018 per Dr. [name] at [facility]. Risks benefits and expectations were discussed with the patient. Patient understand risks, benefits and expectations and wishes to proceed.     West Pugh Osmara Drummonds   PA-C  01/18/2018, 12:29 PM

## 2018-01-18 NOTE — Progress Notes (Signed)
Surgery on 01/19/18- Need orders in epicl

## 2018-01-19 ENCOUNTER — Encounter (HOSPITAL_COMMUNITY): Payer: Self-pay | Admitting: *Deleted

## 2018-01-19 ENCOUNTER — Ambulatory Visit (HOSPITAL_COMMUNITY): Payer: Medicare HMO | Admitting: Anesthesiology

## 2018-01-19 ENCOUNTER — Encounter (HOSPITAL_COMMUNITY)
Admission: RE | Disposition: A | Discharge status: Home / Self Care | Payer: Self-pay | Source: Ambulatory Visit | Attending: Orthopedic Surgery

## 2018-01-19 ENCOUNTER — Observation Stay (HOSPITAL_COMMUNITY)
Admission: RE | Admit: 2018-01-19 | Discharge: 2018-01-20 | Disposition: A | Discharge status: Home / Self Care | Payer: Medicare HMO | Source: Ambulatory Visit | Attending: Orthopedic Surgery | Admitting: Orthopedic Surgery

## 2018-01-19 ENCOUNTER — Other Ambulatory Visit: Payer: Self-pay

## 2018-01-19 DIAGNOSIS — E785 Hyperlipidemia, unspecified: Secondary | ICD-10-CM | POA: Diagnosis not present

## 2018-01-19 DIAGNOSIS — Y831 Surgical operation with implant of artificial internal device as the cause of abnormal reaction of the patient, or of later complication, without mention of misadventure at the time of the procedure: Secondary | ICD-10-CM | POA: Diagnosis not present

## 2018-01-19 DIAGNOSIS — M96840 Postprocedural hematoma of a musculoskeletal structure following a musculoskeletal system procedure: Secondary | ICD-10-CM | POA: Diagnosis not present

## 2018-01-19 DIAGNOSIS — Z79890 Hormone replacement therapy (postmenopausal): Secondary | ICD-10-CM | POA: Insufficient documentation

## 2018-01-19 DIAGNOSIS — E663 Overweight: Secondary | ICD-10-CM | POA: Diagnosis present

## 2018-01-19 DIAGNOSIS — I10 Essential (primary) hypertension: Secondary | ICD-10-CM | POA: Insufficient documentation

## 2018-01-19 DIAGNOSIS — S8002XA Contusion of left knee, initial encounter: Secondary | ICD-10-CM | POA: Diagnosis present

## 2018-01-19 DIAGNOSIS — K219 Gastro-esophageal reflux disease without esophagitis: Secondary | ICD-10-CM | POA: Insufficient documentation

## 2018-01-19 DIAGNOSIS — Z79899 Other long term (current) drug therapy: Secondary | ICD-10-CM | POA: Diagnosis not present

## 2018-01-19 DIAGNOSIS — E039 Hypothyroidism, unspecified: Secondary | ICD-10-CM | POA: Diagnosis not present

## 2018-01-19 DIAGNOSIS — Z96653 Presence of artificial knee joint, bilateral: Secondary | ICD-10-CM | POA: Diagnosis not present

## 2018-01-19 DIAGNOSIS — Z87891 Personal history of nicotine dependence: Secondary | ICD-10-CM | POA: Diagnosis not present

## 2018-01-19 DIAGNOSIS — T8482XA Fibrosis due to internal orthopedic prosthetic devices, implants and grafts, initial encounter: Secondary | ICD-10-CM | POA: Diagnosis not present

## 2018-01-19 DIAGNOSIS — I4891 Unspecified atrial fibrillation: Secondary | ICD-10-CM | POA: Diagnosis not present

## 2018-01-19 DIAGNOSIS — Z7901 Long term (current) use of anticoagulants: Secondary | ICD-10-CM | POA: Insufficient documentation

## 2018-01-19 DIAGNOSIS — M25662 Stiffness of left knee, not elsewhere classified: Secondary | ICD-10-CM | POA: Diagnosis not present

## 2018-01-19 DIAGNOSIS — Z96652 Presence of left artificial knee joint: Secondary | ICD-10-CM | POA: Diagnosis not present

## 2018-01-19 DIAGNOSIS — M24662 Ankylosis, left knee: Secondary | ICD-10-CM | POA: Insufficient documentation

## 2018-01-19 DIAGNOSIS — J189 Pneumonia, unspecified organism: Secondary | ICD-10-CM | POA: Diagnosis not present

## 2018-01-19 HISTORY — PX: HEMATOMA EVACUATION: SHX5118

## 2018-01-19 HISTORY — DX: Cardiac arrhythmia, unspecified: I49.9

## 2018-01-19 HISTORY — DX: Hypothyroidism, unspecified: E03.9

## 2018-01-19 SURGERY — EVACUATION HEMATOMA
Anesthesia: General | Site: Knee | Laterality: Left

## 2018-01-19 MED ORDER — PROMETHAZINE HCL 25 MG/ML IJ SOLN: 6.2500 mg | INTRAMUSCULAR | Status: DC | PRN

## 2018-01-19 MED ORDER — GABAPENTIN 300 MG PO CAPS
300.0000 mg | ORAL_CAPSULE | Freq: Two times a day (BID) | ORAL | Status: DC
  Administered 2018-01-19 – 2018-01-20 (×2): 300 mg via ORAL
  Filled 2018-01-19 (×2): qty 1

## 2018-01-19 MED ORDER — VENLAFAXINE HCL ER 75 MG PO CP24
75.0000 mg | ORAL_CAPSULE | Freq: Every day | ORAL | Status: DC
  Administered 2018-01-20: 75 mg via ORAL
  Filled 2018-01-19: qty 1

## 2018-01-19 MED ORDER — 0.9 % SODIUM CHLORIDE (POUR BTL) OPTIME
TOPICAL | Status: DC | PRN
  Administered 2018-01-19: 1000 mL

## 2018-01-19 MED ORDER — EPHEDRINE SULFATE-NACL 50-0.9 MG/10ML-% IV SOSY
PREFILLED_SYRINGE | INTRAVENOUS | Status: DC | PRN
  Administered 2018-01-19: 15 mg via INTRAVENOUS

## 2018-01-19 MED ORDER — PROPOFOL 10 MG/ML IV BOLUS
INTRAVENOUS | Status: AC
  Filled 2018-01-19: qty 20

## 2018-01-19 MED ORDER — VALSARTAN-HYDROCHLOROTHIAZIDE 160-25 MG PO TABS: 1.0000 | ORAL_TABLET | Freq: Every day | ORAL | Status: DC

## 2018-01-19 MED ORDER — TRANEXAMIC ACID 1000 MG/10ML IV SOLN
1000.0000 mg | Freq: Once | INTRAVENOUS | Status: AC
  Administered 2018-01-19: 1000 mg via INTRAVENOUS
  Filled 2018-01-19: qty 1100

## 2018-01-19 MED ORDER — DOCUSATE SODIUM 100 MG PO CAPS: 100.0000 mg | ORAL_CAPSULE | Freq: Two times a day (BID) | ORAL | 0 refills | Status: DC

## 2018-01-19 MED ORDER — ACETAMINOPHEN 10 MG/ML IV SOLN
1000.0000 mg | Freq: Once | INTRAVENOUS | Status: DC | PRN
  Administered 2018-01-19: 1000 mg via INTRAVENOUS

## 2018-01-19 MED ORDER — PROPOFOL 10 MG/ML IV BOLUS
INTRAVENOUS | Status: DC | PRN
  Administered 2018-01-19: 150 mg via INTRAVENOUS

## 2018-01-19 MED ORDER — POLYETHYLENE GLYCOL 3350 17 G PO PACK
17.0000 g | PACK | Freq: Two times a day (BID) | ORAL | Status: DC
  Administered 2018-01-19: 17 g via ORAL
  Filled 2018-01-19 (×2): qty 1

## 2018-01-19 MED ORDER — DEXAMETHASONE SODIUM PHOSPHATE 10 MG/ML IJ SOLN: 10.0000 mg | Freq: Once | INTRAMUSCULAR | Status: DC

## 2018-01-19 MED ORDER — FUROSEMIDE 40 MG PO TABS
40.0000 mg | ORAL_TABLET | Freq: Every day | ORAL | Status: DC
  Administered 2018-01-20: 40 mg via ORAL
  Filled 2018-01-19: qty 1

## 2018-01-19 MED ORDER — FERROUS SULFATE 325 (65 FE) MG PO TABS
325.0000 mg | ORAL_TABLET | Freq: Three times a day (TID) | ORAL | Status: DC
  Administered 2018-01-20: 11:00:00 325 mg via ORAL
  Filled 2018-01-19: qty 1

## 2018-01-19 MED ORDER — ONDANSETRON HCL 4 MG/2ML IJ SOLN
INTRAMUSCULAR | Status: AC
  Filled 2018-01-19: qty 2

## 2018-01-19 MED ORDER — LIDOCAINE 2% (20 MG/ML) 5 ML SYRINGE
INTRAMUSCULAR | Status: DC | PRN
  Administered 2018-01-19: 100 mg via INTRAVENOUS

## 2018-01-19 MED ORDER — CHLORHEXIDINE GLUCONATE 4 % EX LIQD: 60.0000 mL | Freq: Once | CUTANEOUS | Status: DC

## 2018-01-19 MED ORDER — METOCLOPRAMIDE HCL 5 MG PO TABS: 5.0000 mg | ORAL_TABLET | Freq: Three times a day (TID) | ORAL | Status: DC | PRN

## 2018-01-19 MED ORDER — IRBESARTAN 150 MG PO TABS
150.0000 mg | ORAL_TABLET | Freq: Every day | ORAL | Status: DC
  Administered 2018-01-19: 20:00:00 150 mg via ORAL
  Filled 2018-01-19 (×2): qty 1

## 2018-01-19 MED ORDER — FENTANYL CITRATE (PF) 100 MCG/2ML IJ SOLN
INTRAMUSCULAR | Status: DC | PRN
  Administered 2018-01-19: 25 ug via INTRAVENOUS
  Administered 2018-01-19: 50 ug via INTRAVENOUS
  Administered 2018-01-19 (×3): 25 ug via INTRAVENOUS
  Administered 2018-01-19: 50 ug via INTRAVENOUS

## 2018-01-19 MED ORDER — HYDROCODONE-ACETAMINOPHEN 7.5-325 MG PO TABS
1.0000 | ORAL_TABLET | ORAL | Status: DC | PRN
  Administered 2018-01-19 – 2018-01-20 (×4): 1 via ORAL
  Filled 2018-01-19 (×4): qty 1

## 2018-01-19 MED ORDER — MENTHOL 3 MG MT LOZG: 1.0000 | LOZENGE | OROMUCOSAL | Status: DC | PRN

## 2018-01-19 MED ORDER — FENTANYL CITRATE (PF) 100 MCG/2ML IJ SOLN
INTRAMUSCULAR | Status: AC
  Filled 2018-01-19: qty 2

## 2018-01-19 MED ORDER — MAGNESIUM CITRATE PO SOLN: 1.0000 | Freq: Once | ORAL | Status: DC | PRN

## 2018-01-19 MED ORDER — CEFAZOLIN SODIUM-DEXTROSE 2-4 GM/100ML-% IV SOLN
2.0000 g | INTRAVENOUS | Status: AC
  Administered 2018-01-19: 2 g via INTRAVENOUS
  Filled 2018-01-19: qty 100

## 2018-01-19 MED ORDER — FENTANYL CITRATE (PF) 100 MCG/2ML IJ SOLN
25.0000 ug | INTRAMUSCULAR | Status: DC | PRN
  Administered 2018-01-19 (×2): 50 ug via INTRAVENOUS

## 2018-01-19 MED ORDER — TRANEXAMIC ACID 1000 MG/10ML IV SOLN
1000.0000 mg | INTRAVENOUS | Status: AC
  Administered 2018-01-19: 1000 mg via INTRAVENOUS
  Filled 2018-01-19: qty 1100

## 2018-01-19 MED ORDER — LIDOCAINE 2% (20 MG/ML) 5 ML SYRINGE
INTRAMUSCULAR | Status: AC
  Filled 2018-01-19: qty 5

## 2018-01-19 MED ORDER — ONDANSETRON HCL 4 MG/2ML IJ SOLN: 4.0000 mg | Freq: Once | INTRAMUSCULAR | Status: DC | PRN

## 2018-01-19 MED ORDER — LEVOTHYROXINE SODIUM 75 MCG PO TABS
150.0000 ug | ORAL_TABLET | Freq: Every day | ORAL | Status: DC
  Administered 2018-01-20: 11:00:00 150 ug via ORAL
  Filled 2018-01-19: qty 2

## 2018-01-19 MED ORDER — LACTATED RINGERS IV SOLN
INTRAVENOUS | Status: DC
  Administered 2018-01-19 (×2): via INTRAVENOUS

## 2018-01-19 MED ORDER — DIPHENHYDRAMINE HCL 12.5 MG/5ML PO ELIX: 12.5000 mg | ORAL_SOLUTION | ORAL | Status: DC | PRN

## 2018-01-19 MED ORDER — PHENYLEPHRINE 40 MCG/ML (10ML) SYRINGE FOR IV PUSH (FOR BLOOD PRESSURE SUPPORT)
PREFILLED_SYRINGE | INTRAVENOUS | Status: DC | PRN
  Administered 2018-01-19: 80 ug via INTRAVENOUS

## 2018-01-19 MED ORDER — PANTOPRAZOLE SODIUM 40 MG PO TBEC
40.0000 mg | DELAYED_RELEASE_TABLET | Freq: Every day | ORAL | Status: DC
  Administered 2018-01-20: 11:00:00 40 mg via ORAL
  Filled 2018-01-19: qty 1

## 2018-01-19 MED ORDER — ONDANSETRON HCL 4 MG/2ML IJ SOLN
INTRAMUSCULAR | Status: DC | PRN
  Administered 2018-01-19: 4 mg via INTRAVENOUS

## 2018-01-19 MED ORDER — POLYETHYLENE GLYCOL 3350 17 G PO PACK: 17.0000 g | PACK | Freq: Two times a day (BID) | ORAL | 0 refills | Status: DC

## 2018-01-19 MED ORDER — PHENYLEPHRINE 40 MCG/ML (10ML) SYRINGE FOR IV PUSH (FOR BLOOD PRESSURE SUPPORT)
PREFILLED_SYRINGE | INTRAVENOUS | Status: AC
  Filled 2018-01-19: qty 10

## 2018-01-19 MED ORDER — CYCLOBENZAPRINE HCL 5 MG PO TABS: 5.0000 mg | ORAL_TABLET | Freq: Three times a day (TID) | ORAL | 0 refills | Status: DC | PRN

## 2018-01-19 MED ORDER — BISACODYL 10 MG RE SUPP: 10.0000 mg | Freq: Every day | RECTAL | Status: DC | PRN

## 2018-01-19 MED ORDER — DEXAMETHASONE SODIUM PHOSPHATE 10 MG/ML IJ SOLN
10.0000 mg | Freq: Once | INTRAMUSCULAR | Status: AC
  Administered 2018-01-19: 10 mg via INTRAVENOUS

## 2018-01-19 MED ORDER — ACETAMINOPHEN 325 MG PO TABS
650.0000 mg | ORAL_TABLET | ORAL | Status: DC | PRN
Start: 2018-01-19 — End: 2018-01-20

## 2018-01-19 MED ORDER — DOCUSATE SODIUM 100 MG PO CAPS
100.0000 mg | ORAL_CAPSULE | Freq: Two times a day (BID) | ORAL | Status: DC
  Administered 2018-01-19 – 2018-01-20 (×2): 100 mg via ORAL
  Filled 2018-01-19 (×3): qty 1

## 2018-01-19 MED ORDER — ONDANSETRON HCL 4 MG PO TABS: 4.0000 mg | ORAL_TABLET | Freq: Three times a day (TID) | ORAL | Status: DC | PRN

## 2018-01-19 MED ORDER — METHOCARBAMOL 500 MG PO TABS: 500.0000 mg | ORAL_TABLET | Freq: Four times a day (QID) | ORAL | Status: DC | PRN

## 2018-01-19 MED ORDER — PHENOL 1.4 % MT LIQD: 1.0000 | OROMUCOSAL | Status: DC | PRN

## 2018-01-19 MED ORDER — PHENYLEPHRINE 40 MCG/ML (10ML) SYRINGE FOR IV PUSH (FOR BLOOD PRESSURE SUPPORT)
PREFILLED_SYRINGE | INTRAVENOUS | Status: AC
  Filled 2018-01-19: qty 30

## 2018-01-19 MED ORDER — ACETAMINOPHEN 650 MG RE SUPP: 650.0000 mg | RECTAL | Status: DC | PRN

## 2018-01-19 MED ORDER — HYDROCODONE-ACETAMINOPHEN 7.5-325 MG PO TABS: 1.0000 | ORAL_TABLET | Freq: Once | ORAL | Status: DC | PRN

## 2018-01-19 MED ORDER — METHOCARBAMOL 1000 MG/10ML IJ SOLN
500.0000 mg | Freq: Four times a day (QID) | INTRAVENOUS | Status: DC | PRN
  Administered 2018-01-19: 500 mg via INTRAVENOUS
  Filled 2018-01-19: qty 550

## 2018-01-19 MED ORDER — HYDROMORPHONE HCL 1 MG/ML IJ SOLN
INTRAMUSCULAR | Status: AC
  Filled 2018-01-19: qty 2

## 2018-01-19 MED ORDER — CEFAZOLIN SODIUM-DEXTROSE 2-4 GM/100ML-% IV SOLN
2.0000 g | Freq: Four times a day (QID) | INTRAVENOUS | Status: AC
  Administered 2018-01-19 – 2018-01-20 (×2): 2 g via INTRAVENOUS
  Filled 2018-01-19 (×2): qty 100

## 2018-01-19 MED ORDER — ONDANSETRON HCL 4 MG/2ML IJ SOLN: 4.0000 mg | Freq: Three times a day (TID) | INTRAMUSCULAR | Status: DC | PRN

## 2018-01-19 MED ORDER — FERROUS SULFATE 325 (65 FE) MG PO TABS: 325.0000 mg | ORAL_TABLET | Freq: Three times a day (TID) | ORAL | 3 refills | Status: DC

## 2018-01-19 MED ORDER — HYDROCODONE-ACETAMINOPHEN 7.5-325 MG PO TABS: 2.0000 | ORAL_TABLET | ORAL | Status: DC | PRN

## 2018-01-19 MED ORDER — METOPROLOL TARTRATE 50 MG PO TABS
50.0000 mg | ORAL_TABLET | Freq: Two times a day (BID) | ORAL | Status: DC
  Administered 2018-01-19 – 2018-01-20 (×2): 50 mg via ORAL
  Filled 2018-01-19 (×2): qty 1

## 2018-01-19 MED ORDER — HYDROMORPHONE HCL 1 MG/ML IJ SOLN
0.2500 mg | INTRAMUSCULAR | Status: DC | PRN
  Administered 2018-01-19 (×2): 0.5 mg via INTRAVENOUS

## 2018-01-19 MED ORDER — HYDROMORPHONE HCL 1 MG/ML IJ SOLN: 0.5000 mg | INTRAMUSCULAR | Status: DC | PRN

## 2018-01-19 MED ORDER — DEXAMETHASONE SODIUM PHOSPHATE 10 MG/ML IJ SOLN
INTRAMUSCULAR | Status: AC
  Filled 2018-01-19: qty 1

## 2018-01-19 MED ORDER — ATORVASTATIN CALCIUM 20 MG PO TABS: 20.0000 mg | ORAL_TABLET | Freq: Every day | ORAL | Status: DC

## 2018-01-19 MED ORDER — METOCLOPRAMIDE HCL 5 MG/ML IJ SOLN: 5.0000 mg | Freq: Three times a day (TID) | INTRAMUSCULAR | Status: DC | PRN

## 2018-01-19 MED ORDER — ALUM & MAG HYDROXIDE-SIMETH 200-200-20 MG/5ML PO SUSP: 15.0000 mL | ORAL | Status: DC | PRN

## 2018-01-19 MED ORDER — ACETAMINOPHEN 10 MG/ML IV SOLN
INTRAVENOUS | Status: AC
  Filled 2018-01-19: qty 100

## 2018-01-19 MED ORDER — HYDROCHLOROTHIAZIDE 25 MG PO TABS
25.0000 mg | ORAL_TABLET | Freq: Every day | ORAL | Status: DC
  Administered 2018-01-20: 11:00:00 25 mg via ORAL
  Filled 2018-01-19 (×2): qty 1

## 2018-01-19 MED ORDER — SODIUM CHLORIDE 0.9 % IV SOLN
INTRAVENOUS | Status: DC
  Administered 2018-01-19: 23:00:00 via INTRAVENOUS

## 2018-01-19 MED ORDER — HYDROCODONE-ACETAMINOPHEN 7.5-325 MG PO TABS: 1.0000 | ORAL_TABLET | ORAL | 0 refills | Status: DC | PRN

## 2018-01-19 MED ORDER — SODIUM CHLORIDE 0.9 % IR SOLN
Status: DC | PRN
  Administered 2018-01-19: 3000 mL

## 2018-01-19 MED ORDER — MEPERIDINE HCL 50 MG/ML IJ SOLN: 6.2500 mg | INTRAMUSCULAR | Status: DC | PRN

## 2018-01-19 SURGICAL SUPPLY — 38 items
BAG ZIPLOCK 12X15 (MISCELLANEOUS) ×2 IMPLANT
BANDAGE ACE 6X5 VEL STRL LF (GAUZE/BANDAGES/DRESSINGS) ×2 IMPLANT
BANDAGE ESMARK 6X9 LF (GAUZE/BANDAGES/DRESSINGS) ×1 IMPLANT
BNDG ESMARK 6X9 LF (GAUZE/BANDAGES/DRESSINGS) ×2
COVER SURGICAL LIGHT HANDLE (MISCELLANEOUS) ×2 IMPLANT
CUFF TOURN SGL QUICK 34 (TOURNIQUET CUFF) ×1
CUFF TRNQT CYL 34X4X40X1 (TOURNIQUET CUFF) ×1 IMPLANT
DERMABOND ADVANCED (GAUZE/BANDAGES/DRESSINGS) ×1
DERMABOND ADVANCED .7 DNX12 (GAUZE/BANDAGES/DRESSINGS) ×1 IMPLANT
DRESSING AQUACEL AG SP 3.5X10 (GAUZE/BANDAGES/DRESSINGS) ×1 IMPLANT
DRSG AQUACEL AG SP 3.5X10 (GAUZE/BANDAGES/DRESSINGS) ×2
DRSG TEGADERM 4X4.75 (GAUZE/BANDAGES/DRESSINGS) ×2 IMPLANT
DURAPREP 26ML APPLICATOR (WOUND CARE) ×4 IMPLANT
ELECT REM PT RETURN 15FT ADLT (MISCELLANEOUS) ×2 IMPLANT
EVACUATOR 1/8 PVC DRAIN (DRAIN) ×2 IMPLANT
GAUZE SPONGE 2X2 8PLY STRL LF (GAUZE/BANDAGES/DRESSINGS) ×1 IMPLANT
GLOVE BIOGEL M STRL SZ7.5 (GLOVE) ×4 IMPLANT
GLOVE BIOGEL PI IND STRL 7.5 (GLOVE) ×6 IMPLANT
GLOVE BIOGEL PI INDICATOR 7.5 (GLOVE) ×6
GLOVE ECLIPSE 8.0 STRL XLNG CF (GLOVE) ×2 IMPLANT
GLOVE ORTHO TXT STRL SZ7.5 (GLOVE) ×4 IMPLANT
GOWN STRL REUS W/TWL LRG LVL3 (GOWN DISPOSABLE) ×2 IMPLANT
GOWN STRL REUS W/TWL XL LVL3 (GOWN DISPOSABLE) ×4 IMPLANT
HANDPIECE INTERPULSE COAX TIP (DISPOSABLE) ×1
KIT BASIN OR (CUSTOM PROCEDURE TRAY) ×2 IMPLANT
MANIFOLD NEPTUNE II (INSTRUMENTS) ×2 IMPLANT
PACK ORTHO EXTREMITY (CUSTOM PROCEDURE TRAY) ×2 IMPLANT
POSITIONER SURGICAL ARM (MISCELLANEOUS) ×2 IMPLANT
SET HNDPC FAN SPRY TIP SCT (DISPOSABLE) ×1 IMPLANT
SOL PREP PROV IODINE SCRUB 4OZ (MISCELLANEOUS) ×2 IMPLANT
SPONGE GAUZE 2X2 STER 10/PKG (GAUZE/BANDAGES/DRESSINGS) ×1
SUT VIC AB 1 CT1 27 (SUTURE) ×1
SUT VIC AB 1 CT1 27XBRD ANTBC (SUTURE) ×1 IMPLANT
SUT VIC AB 2-0 CT1 27 (SUTURE) ×1
SUT VIC AB 2-0 CT1 TAPERPNT 27 (SUTURE) ×1 IMPLANT
SYR CONTROL 10ML LL (SYRINGE) ×2 IMPLANT
TOWEL OR 17X26 10 PK STRL BLUE (TOWEL DISPOSABLE) ×4 IMPLANT
WRAP KNEE MAXI GEL POST OP (GAUZE/BANDAGES/DRESSINGS) ×2 IMPLANT

## 2018-01-19 NOTE — Op Note (Signed)
Eugene, White              ACCOUNT NO.:  [id number]  MEDICAL RECORD NO.:  [id number]  LOCATION:                                 FACILITY:  PHYSICIAN:  Pietro Cassis. [name], M.D.       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  PROCEDURES: 1. Open excisional and nonexcisional debridement of left knee with     evacuation of large hematoma. 2. Manipulation of left knee under anesthesia.  SURGEON:  Pietro Cassis. [name], M.D.  ASSISTANTLowell Guitar. Mancel Bale, PA-C.  Note that Ms. Mancel Bale was present for the entirety of the case from preoperative positioning, perioperative management of the operative extremity, general facilitation of the case and primary wound closure.  ANESTHESIA:  General.  BLOOD LOSS:  300 mL.  TOURNIQUET TIME:  Up for 9 minutes at 250 mmHg.  I elected to use 1 medium Hemovac drain.  Once the tourniquet was let down, noting the significant oozing.  He is on Eliquis chronically for atrial fibrillation.  LOCAL MEDICATION:  None.  COMPLICATIONS:  None.  FINDINGS:  See body of dictation below.  INDICATIONS FOR PROCEDURE:  Eugene White is a 72 year old male with history of a right total knee replacement.  He is now about 2 months out from his left total knee replacement.  This required previous removal of hardware in his left femur.  He was in his routine recovery state until he felt that he hyperflexed his knee when he was sitting down onto a chair.  Since that time, he has had increasing pain.  He was seen and evaluated last week in the office, noted had an increased swelling and pain.  Attempted aspiration and removed some blood from  his knee.  Given the relative loss of his recovery due to increasing pain, limited motion, and swelling of the knee, we elected at this point to take him to the operating room to evacuate the hematoma.  We reviewed the risks and benefits of this.  We felt that this would help to improve the ability for his knee to move.  Plus, we would have a chance to evaluate the extensor mechanism to make certain that it was intact.  PROCEDURE IN DETAIL:  The patient was brought to the operative theater. Once adequate anesthesia, preoperative antibiotics, Ancef administered, he was positioned supine.  A left thigh tourniquet was placed.  Left lower extremity was prepped and draped in usual sterile fashion utilizing the [facility] leg holder.  A time-out was performed identifying the patient, planned procedure and extremity.  Leg was exsanguinated to 250 mmHg.  The patient's old incision was incised.  Soft tissue planes created.  There was some evidence of subcutaneous bruising with mild hematoma collection in this layer.  I then performed an arthrotomy medially.  Once we entered into the joint, there was some blood, but the vast majority was clotted hematoma. The joint was exposed in a  routine fashion.  At this point, the hematoma was scooped out and evacuated from the knee.  Once this was done, I irrigated the knee initially with 1 L of normal saline solution to clarify tissue boundaries.  I then performed a scar debridement along the medial and lateral gutters.  Please note that preoperatively before we made the incision, I examined his knee under anesthesia and found he had a range of motion about 60 or 70 degrees.  Once I evacuated the hematoma and granted that the arthrotomy was opened, the patella was located within the trochlear groove and I was able to get him flexed back to at least 100 degrees.  He still was very tight.  There was no significant lysis of adhesions, no evidence of  intra-articular adhesions once I was in the knee.  Once the surgical excision of this subcutaneous scar tissue was debrided, the remainder 2 L of normal saline solution was irrigated with pulse lavage.  Once this was done, I did let the tourniquet down.  He was noted to have significant amount of oozing from the knee.  I initially was not going to use a Hemovac, but elected at this point to use a Hemovac based on the primary reason and indication for this procedure.  The Hemovac drain was placed deep.  The extensor mechanism was then reapproximated using #1 Vicryl and #1 Stratafix suture with the knee flexed to about 60 or 70 degrees.  The remainder of the wound was closed in layers with 2-0 Vicryl and a running Monocryl stitch.  The skin was cleaned, dried and dressed sterilely using surgical glue and Aquacel dressing.  The knee was wrapped in Ace wrap.  Based on these intraoperative findings and the challenges with his motion, I am going to place him in a CPM overnight.  We will consider when we discharge that in the hospital.  I will also hold off on his Eliquis for 24-48 hours to allow for this to settle down before we get him back on anticoagulation, which he uses predominantly for atrial fibrillation.  At this point, from a knee perspective, I would not feel that anticoagulation was indicated; however, he is on this chronically for his atrial fibrillation.  Otherwise, we will get him back into physical therapy to work on motion and strength.  We will hope that we will optimize his overall recovery now that we have performed this procedure. Findings were reviewed with family.     Pietro Cassis [name], M.D.     MDO/MEDQ  D:  01/19/2018  T:  01/19/2018  Job:  [id number]

## 2018-01-19 NOTE — Discharge Instructions (Signed)

## 2018-01-19 NOTE — Anesthesia Postprocedure Evaluation (Signed)
Anesthesia Post Note  Patient: Eugene White  Procedure(s) Performed: Irrigation and debridement, Evacuation of left total knee hematoma (Left Knee)     Patient location during evaluation: PACU Anesthesia Type: General Level of consciousness: awake and alert Pain management: pain level controlled Vital Signs Assessment: post-procedure vital signs reviewed and stable Respiratory status: spontaneous breathing, nonlabored ventilation, respiratory function stable and patient connected to nasal cannula oxygen Cardiovascular status: blood pressure returned to baseline and stable Postop Assessment: no apparent nausea or vomiting Anesthetic complications: no    Last Vitals:  Vitals:   01/19/18 1825 01/19/18 1842  BP: (!) 142/84 (!) 138/91  Pulse: 67 78  Resp: 12 13  Temp: 36.8 C 36.7 C  SpO2:  91%    Last Pain:  Vitals:   01/19/18 1842  TempSrc:   PainSc: 3                  Barnet Glasgow

## 2018-01-19 NOTE — Anesthesia Procedure Notes (Signed)
Procedure Name: LMA Insertion Date/Time: 01/19/2018 4:05 PM Performed by: Talbot Grumbling, CRNA Pre-anesthesia Checklist: Patient identified, Emergency Drugs available, Suction available and Patient being monitored Patient Re-evaluated:Patient Re-evaluated prior to induction Oxygen Delivery Method: Circle system utilized Preoxygenation: Pre-oxygenation with 100% oxygen Induction Type: IV induction Ventilation: Mask ventilation without difficulty LMA: LMA inserted LMA Size: 4.0 Number of attempts: 1 Placement Confirmation: positive ETCO2 and breath sounds checked- equal and bilateral Tube secured with: Tape Dental Injury: Teeth and Oropharynx as per pre-operative assessment

## 2018-01-19 NOTE — Transfer of Care (Signed)
Immediate Anesthesia Transfer of Care Note  Patient: Eugene White  Procedure(s) Performed: Irrigation and debridement, Evacuation of left total knee hematoma (Left Knee)  Patient Location: PACU  Anesthesia Type:General  Level of Consciousness: awake, alert  and oriented  Airway & Oxygen Therapy: Patient Spontanous Breathing and Patient connected to face mask oxygen  Post-op Assessment: Report given to RN and Post -op Vital signs reviewed and stable  Post vital signs: Reviewed and stable  Last Vitals:  Vitals:   01/19/18 1351 01/19/18 1415  BP: 128/74   Pulse: 72 69  Resp: 20   Temp: 36.7 C   SpO2: 99%     Last Pain:  Vitals:   01/19/18 1351  TempSrc: Oral         Complications: No apparent anesthesia complications

## 2018-01-19 NOTE — Brief Op Note (Signed)
01/19/2018  5:02 PM  PATIENT:  Eugene White  72 y.o. 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 Manipulation under anesthesia  SURGEON:  Surgeon(s) and Role:    [name], MD - Primary  PHYSICIAN ASSISTANT: Nehemiah Massed, PA-C  ANESTHESIA:   general  EBL:  300 mL   BLOOD ADMINISTERED:none  DRAINS: (1 medium) Hemovact drain(s) in the left knee with  Suction Open   LOCAL MEDICATIONS USED:  NONE  SPECIMEN:  No Specimen  DISPOSITION OF SPECIMEN:  N/A  COUNTS:  YES  TOURNIQUET:   Total Tourniquet Time Documented: Thigh (Left) - 9 minutes Total: Thigh (Left) - 9 minutes   DICTATION: .Other Dictation: Dictation Number [id number]  PLAN OF CARE: Admit for overnight observation  PATIENT DISPOSITION:  PACU - hemodynamically stable.   Delay start of Pharmacological VTE agent (>24hrs) due to surgical blood loss or risk of bleeding: yes

## 2018-01-19 NOTE — Interval H&P Note (Signed)
History and Physical Interval Note:  01/19/2018 2:04 PM  Eugene White  has presented today for surgery, with the diagnosis of Status post left total knee arthroplasty with hematoma  The various methods of treatment have been discussed with the patient and family. After consideration of risks, benefits and other options for treatment, the patient has consented to  Procedure(s) with comments: Irrigation and debridement, Evacuation of left total knee hematoma (Left) - 90 mins as a surgical intervention .  The patient's history has been reviewed, patient examined, no change in status, stable for surgery.  I have reviewed the patient's chart and labs.  Questions were answered to the patient's satisfaction.     [name]

## 2018-01-19 NOTE — Anesthesia Preprocedure Evaluation (Addendum)
Anesthesia Evaluation  Patient identified by MRN, date of birth, ID band Patient awake    Reviewed: Allergy & Precautions, NPO status , Patient's Chart, lab work & pertinent test results  History of Anesthesia Complications (+) PROLONGED EMERGENCE  Airway Mallampati: III  TM Distance: >3 FB Neck ROM: Full    Dental no notable dental hx. (+) Teeth Intact   Pulmonary neg pulmonary ROS, former smoker,    Pulmonary exam normal        Cardiovascular hypertension, negative cardio ROS Normal cardiovascular exam Rhythm:Regular Rate:Normal     Neuro/Psych negative neurological ROS  negative psych ROS   GI/Hepatic negative GI ROS, Neg liver ROS,   Endo/Other  negative endocrine ROS  Renal/GU negative Renal ROS  negative genitourinary   Musculoskeletal negative musculoskeletal ROS (+)   Abdominal   Peds  Hematology negative hematology ROS (+)   Anesthesia Other Findings   Reproductive/Obstetrics                             Anesthesia Physical Anesthesia Plan  ASA: III  Anesthesia Plan: General   Post-op Pain Management:    Induction: Intravenous  PONV Risk Score and Plan: 2 and Treatment may vary due to age or medical condition, Ondansetron and Dexamethasone  Airway Management Planned: LMA  Additional Equipment:   Intra-op Plan:   Post-operative Plan: Extubation in OR  Informed Consent: I have reviewed the patients History and Physical, chart, labs and discussed the procedure including the risks, benefits and alternatives for the proposed anesthesia with the patient or authorized representative who has indicated his/her understanding and acceptance.   Dental advisory given  Plan Discussed with: CRNA and Anesthesiologist  Anesthesia Plan Comments:         Anesthesia Quick Evaluation

## 2018-01-20 ENCOUNTER — Encounter (HOSPITAL_COMMUNITY): Payer: Self-pay | Admitting: Orthopedic Surgery

## 2018-01-20 ENCOUNTER — Telehealth: Payer: Self-pay | Admitting: Cardiology

## 2018-01-20 DIAGNOSIS — M96840 Postprocedural hematoma of a musculoskeletal structure following a musculoskeletal system procedure: Secondary | ICD-10-CM | POA: Diagnosis not present

## 2018-01-20 DIAGNOSIS — I4891 Unspecified atrial fibrillation: Secondary | ICD-10-CM | POA: Diagnosis not present

## 2018-01-20 DIAGNOSIS — M24662 Ankylosis, left knee: Secondary | ICD-10-CM | POA: Diagnosis not present

## 2018-01-20 DIAGNOSIS — Z96653 Presence of artificial knee joint, bilateral: Secondary | ICD-10-CM | POA: Diagnosis not present

## 2018-01-20 DIAGNOSIS — Z7901 Long term (current) use of anticoagulants: Secondary | ICD-10-CM | POA: Diagnosis not present

## 2018-01-20 DIAGNOSIS — K219 Gastro-esophageal reflux disease without esophagitis: Secondary | ICD-10-CM | POA: Diagnosis not present

## 2018-01-20 DIAGNOSIS — E039 Hypothyroidism, unspecified: Secondary | ICD-10-CM | POA: Diagnosis not present

## 2018-01-20 DIAGNOSIS — E663 Overweight: Secondary | ICD-10-CM | POA: Diagnosis present

## 2018-01-20 DIAGNOSIS — E785 Hyperlipidemia, unspecified: Secondary | ICD-10-CM | POA: Diagnosis not present

## 2018-01-20 DIAGNOSIS — I10 Essential (primary) hypertension: Secondary | ICD-10-CM | POA: Diagnosis not present

## 2018-01-20 LAB — BASIC METABOLIC PANEL
Anion gap: 5 (ref 5–15)
BUN: 10 mg/dL (ref 6–20)
CHLORIDE: 98 mmol/L — AB (ref 101–111)
CO2: 29 mmol/L (ref 22–32)
Calcium: 8.8 mg/dL — ABNORMAL LOW (ref 8.9–10.3)
Creatinine, Ser: 0.53 mg/dL — ABNORMAL LOW (ref 0.61–1.24)
GFR calc non Af Amer: >60 mL/min (ref >60)
Glucose, Bld: 147 mg/dL — ABNORMAL HIGH (ref 65–99)
Potassium: 4.1 mmol/L (ref 3.5–5.1)
Sodium: 132 mmol/L — ABNORMAL LOW (ref 135–145)

## 2018-01-20 LAB — CBC
HEMATOCRIT: 32.8 % — AB (ref 39.0–52.0)
HEMOGLOBIN: 10.9 g/dL — AB (ref 13.0–17.0)
MCH: 32.4 pg (ref 26.0–34.0)
MCHC: 33.2 g/dL (ref 30.0–36.0)
MCV: 97.6 fL (ref 78.0–100.0)
Platelets: 354 10*3/uL (ref 150–400)
RBC: 3.36 MIL/uL — ABNORMAL LOW (ref 4.22–5.81)
RDW: 13.6 % (ref 11.5–15.5)
WBC: 8.3 10*3/uL (ref 4.0–10.5)

## 2018-01-20 MED ORDER — DOXYCYCLINE HYCLATE 100 MG PO CAPS: 100.0000 mg | ORAL_CAPSULE | Freq: Two times a day (BID) | ORAL | 0 refills | Status: DC

## 2018-01-20 NOTE — Progress Notes (Signed)
Changed aquacel and compression wrap to knee per Merwyn Katos. Gave patient and wife hand written prescription for PT. Reviewed discharge instructions and medications. IV removed. No new questions at this time.

## 2018-01-20 NOTE — Evaluation (Signed)
Physical Therapy Evaluation Patient Details Name: Eugene White MRN: [id number] DOB: Oct 10, 1946 Today's Date: 01/20/2018   History of Present Illness  Irrigation and debridement, Evacuation of left total knee hematoma and manipulation  on 01/19/17  Clinical Impression  Patient is  Working diligently on ROM left knee. Will have CPM at home. Ready for DC.    Follow Up Recommendations Outpatient PT    Equipment Recommendations  None recommended by PT    Recommendations for Other Services       Precautions / Restrictions Precautions Precautions: Fall;Knee      Mobility  Bed Mobility Overal bed mobility: Modified Independent                Transfers Overall transfer level: Needs assistance Equipment used: Rolling walker (2 wheeled) Transfers: Sit to/from Stand Sit to Stand: Supervision         General transfer comment: cues for safety   Ambulation/Gait Ambulation/Gait assistance: Min guard Ambulation Distance (Feet): 200 Feet Assistive device: Rolling walker (2 wheeled) Gait Pattern/deviations: Step-to pattern;Step-through pattern;Antalgic     General Gait Details: cues for equence and safety  Stairs Stairs: Yes Stairs assistance: Min guard Stair Management: Two rails Number of Stairs: 2 General stair comments: patient used rails even though he has none, has a door frame  Wheelchair Mobility    Modified Rankin (Stroke Patients Only)       Balance                                             Pertinent Vitals/Pain Pain Assessment: 0-10 Pain Location: left kne with flexion Pain Descriptors / Indicators: Tender;Sharp Pain Intervention(s): Monitored during session;Premedicated before session;Ice applied    Home Living Family/patient expects to be discharged to:: Private residence Living Arrangements: Spouse/significant other Available Help at Discharge: Family Type of Home: House Home Access: Stairs to enter Entrance  Stairs-Rails: None Entrance Stairs-Number of Steps: 3 Home Layout: Able to live on main level with bedroom/bathroom Home Equipment: Walker - 2 wheels;Cane - single point;Bedside commode;Tub bench;Crutches      Prior Function Level of Independence: Independent with assistive device(s)               Hand Dominance        Extremity/Trunk Assessment   Upper Extremity Assessment Upper Extremity Assessment: Overall WFL for tasks assessed    Lower Extremity Assessment Lower Extremity Assessment: LLE deficits/detail LLE Deficits / Details: + SLR. 10-75 knee flexion on left       Communication   Communication: No difficulties  Cognition Arousal/Alertness: Awake/alert Behavior During Therapy: Impulsive;WFL for tasks assessed/performed Overall Cognitive Status: Within Functional Limits for tasks assessed                                        General Comments      Exercises Total Joint Exercises Ankle Circles/Pumps: AROM;Both Quad Sets: AROM;10 reps;Both Gluteal Sets: 10 reps Heel Slides: AROM;Left;10 reps Hip ABduction/ADduction: AROM;Left;10 reps Straight Leg Raises: AROM;Left;10 reps   Assessment/Plan    PT Assessment All further PT needs can be met in the next venue of care  PT Problem List Decreased strength;Decreased range of motion;Decreased activity tolerance;Decreased mobility;Pain       PT Treatment Interventions      PT Goals (  Current goals can be found in the Care Plan section)  Acute Rehab PT Goals Patient Stated Goal: home PT Goal Formulation: All assessment and education complete, DC therapy    Frequency     Barriers to discharge        Co-evaluation               AM-PAC PT "6 Clicks" Daily Activity  Outcome Measure Difficulty turning over in bed (including adjusting bedclothes, sheets and blankets)?: None Difficulty moving from lying on back to sitting on the side of the bed? : None Difficulty sitting down on and  standing up from a chair with arms (e.g., wheelchair, bedside commode, etc,.)?: A Little Help needed moving to and from a bed to chair (including a wheelchair)?: A Little Help needed walking in hospital room?: A Little Help needed climbing 3-5 steps with a railing? : A Little 6 Click Score: 20    End of Session   Activity Tolerance: Patient tolerated treatment well Patient left: in bed        Time: 1050-1120 PT Time Calculation (min) (ACUTE ONLY): 30 min   Charges:   PT Evaluation $PT Eval Low Complexity: 1 Low PT Treatments $Gait Training: 8-22 mins   PT G CodesTresa Endo PT [phone] }  Claretha Cooper 01/20/2018, 2:56 PM

## 2018-01-20 NOTE — Telephone Encounter (Signed)
New message  Pt wife verbalized that she is calling for the RN  Pt is admitted into the hospital and she want to go over pt care   And medications

## 2018-01-20 NOTE — Progress Notes (Signed)
    Durable Medical Equipment  (From admission, onward)        Start     Ordered   01/20/18 0831  For Home Use Only DME CPM  Once    Question Answer Comment  Laterality Left Knee   Starting Flexion 70   Ending Flexion max   Increase by Daily 10   Surgery Date 01/19/2018   CPM Started 01/20/2018      01/20/18 0831    Information faxed [phone]) to [employer] [phone]) with [employer] for arranging [place]. [phone]

## 2018-01-20 NOTE — Progress Notes (Signed)
     Subjective: 1 Day Post-Op Procedure(s) (LRB): Irrigation and debridement, Evacuation of left total knee hematoma (Left)   Patient reports pain as mild, pain controlled. No events throughout the night. He states that the knee already feels better and has more mobility then before surgery.  Discussed the use of a CPM at home and continue with PT.  Objective:   VITALS:   Vitals:   01/20/18 0200 01/20/18 0516  BP:  131/74  Pulse: 70 63  Resp: 15 16  Temp:  (!) 97.5 F (36.4 C)  SpO2: 99% 99%    Dorsiflexion/Plantar flexion intact Incision: dressing C/D/I No cellulitis present Compartment soft  LABS Recent Labs    01/20/18 0535  HGB 10.9*  HCT 32.8*  WBC 8.3  PLT 354    Recent Labs    01/20/18 0535  NA 132*  K 4.1  BUN 10  CREATININE 0.53*  GLUCOSE 147*     Assessment/Plan: 1 Day Post-Op Procedure(s) (LRB): Irrigation and debridement, Evacuation of left total knee hematoma (Left) Foley cath d/c'ed Will d/c the HV drain at lunch, discussed the possibility of continued bleeding and how to stop Advance diet Up with therapy D/C IV fluids Discharge home with CPM Follow up in 2 weeks at [facility]. Follow up with OLIN,Katarina Riebe D in 2 weeks.  Contact information:  [facility] [address], [place] [phone]    Overweight (BMI 25-29.9) Estimated body mass index is 28.31 kg/m as calculated from the following:   Height as of this encounter: 5\' 11"  (1.803 m).   Weight as of this encounter: 92.1 kg (203 lb). Patient also counseled that weight may inhibit the healing process Patient counseled that losing weight will help with future health issues      West Pugh. Jaun Galluzzo   PAC  01/20/2018, 8:26 AM

## 2018-01-20 NOTE — Progress Notes (Signed)
OT Screen and Cancellation Note  Patient Details Name: USMAN MILLETT MRN: [id number] DOB: Feb 14, 1946   Screen only/Cancelled Treatment:     Reason OT Evaluation not performed/pt not seen: Orders received and chart reviewed, Pt and his spouse report that they do not have any acute OT needs at this time as he will have PRN Assist for all ADL's. Spouse is retired Therapist, sports. Will sign off OT at this time per pt/family request.  Almyra Deforest, OTR/L  01/20/2018, 10:16 AM

## 2018-01-20 NOTE — Telephone Encounter (Signed)
Pts wife is calling just to update Dr [name] that the pt was admitted to the hospital yesterday, 01/19/18, for diagnosis of status post left total knee arthroplasty with hematoma.  Wife states that Dr. [name] did irrigation and debridement of his left total knee hematoma.  Wife states that Dr [name] was concerned about this issue, when she last saw the pt in the office on 01/08/18.  Wife states during the course of his procedure, they held his Eliquis, but Dr. [name] states he will advise the pt when to resume this, from a surgical standpoint, when he is discharged today. Wife states that the pt is feeling much relief with his knee, and there was no infection noted in that area.  Wife states that he has maintained NSR during his course of his hospital stay. Wife just wanted to let Dr [name] know this information.  Informed the pts wife that Dr [name] is out of the office this week, but I will route this information to her for further review, upon return to the office.  Well wishes provided to the family, and advised them to call our office if they need any further assistance at all. Wife verbalized understanding and agrees with this plan.  Wife gracious for all the assistance provided.

## 2018-01-22 DIAGNOSIS — M25562 Pain in left knee: Secondary | ICD-10-CM | POA: Diagnosis not present

## 2018-01-25 ENCOUNTER — Other Ambulatory Visit: Payer: Medicare HMO | Admitting: *Deleted

## 2018-01-25 DIAGNOSIS — I4891 Unspecified atrial fibrillation: Secondary | ICD-10-CM | POA: Diagnosis not present

## 2018-01-25 DIAGNOSIS — R7989 Other specified abnormal findings of blood chemistry: Secondary | ICD-10-CM

## 2018-01-25 DIAGNOSIS — I10 Essential (primary) hypertension: Secondary | ICD-10-CM | POA: Diagnosis not present

## 2018-01-26 DIAGNOSIS — M25562 Pain in left knee: Secondary | ICD-10-CM | POA: Diagnosis not present

## 2018-01-26 LAB — BASIC METABOLIC PANEL
BUN/Creatinine Ratio: 21 (ref 10–24)
BUN: 13 mg/dL (ref 8–27)
CO2: 27 mmol/L (ref 20–29)
Calcium: 10.1 mg/dL (ref 8.6–10.2)
Chloride: 95 mmol/L — ABNORMAL LOW (ref 96–106)
Creatinine, Ser: 0.63 mg/dL — ABNORMAL LOW (ref 0.76–1.27)
GFR calc Af Amer: 114 mL/min/{1.73_m2} (ref >59)
GFR calc non Af Amer: 98 mL/min/{1.73_m2} (ref >59)
Glucose: 113 mg/dL — ABNORMAL HIGH (ref 65–99)
Potassium: 4.3 mmol/L (ref 3.5–5.2)
Sodium: 138 mmol/L (ref 134–144)

## 2018-01-26 LAB — PRO B NATRIURETIC PEPTIDE: NT-Pro BNP: 216 pg/mL (ref 0–376)

## 2018-01-26 NOTE — Discharge Summary (Signed)
Physician Discharge Summary  Patient ID: Eugene White MRN: [id number] DOB/AGE: March 03, 1946 72 y.o.  Admit date: 01/19/2018 Discharge date: 01/20/2018   Procedures:  Procedure(s) (LRB): Irrigation and debridement, Evacuation of left total knee hematoma (Left)  Attending Physician:  Dr. [name]   Admission Diagnoses:   Left knee pain and swelling 8 weeks s/p TKA  Discharge Diagnoses:  Principal Problem:   Traumatic hematoma of left knee Active Problems:   Overweight (BMI 25.0-29.9)  Past Medical History:  Diagnosis Date  . Anemia    hx of one time  . Arthritis    OA  . Atrial fibrillation with controlled ventricular rate ([place]) 10/05/2017  . Difficulty sleeping   . Diverticulosis 2014  . Dysrhythmia    atrial fib   . 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 GERD (gastroesophageal reflux disease)   . GI symptom    had nausea / vomited once / frequent stools / getting better  . Hyperlipidemia   . Hypertension   . Hypothyroidism   . Mood changes   . Pneumonia    hx of  . Thyroid disease    hypo    HPI:    Pt is a 72 y.o. male complaining of left knee pain and swelling.  Patient is 8 weeks out from a left TKA.  States he was doing well, but he fell and hyper flexed his knee.  Since that time he has had significant swelling, pressure, pain and decreased ROM.   Prior to the fall he states he has 5-95 degrees, but after ROM is 10-3- degrees.  Aspiration of the knee was performed in the office which yielded minimal return of blood.  He has recent been placed on Eliquis because of a-fib.  Options were discussed and due to the decreased ROM, significant pain, pressure and swelling he wishes to proceed with a I&D of the left knee with evacuation of hematoma.  Risks, benefits and expectations were discussed with the patient. Patient understand the risks, benefits and expectations and wishes to proceed with surgery.   PCP: Eugene Common, PA    Discharged Condition: good  Hospital Course:  Patient underwent the above stated procedure on 01/19/2018. Patient tolerated the procedure well and brought to the recovery room in good condition and subsequently to the floor.  POD #1 BP: 131/74 ; Pulse: 63 ; Temp: 97.5 F (36.4 C) ; Resp: 16 Patient reports pain as mild, pain controlled. No events throughout the night. He states that the knee already feels better and has more mobility then before surgery.  Discussed the use of a CPM at home and continue with PT. Dorsiflexion/plantar flexion intact, incision: dressing C/D/I, no cellulitis present and compartment soft.   LABS  Basename    HGB     10.9  HCT     32.8    Discharge Exam: General appearance: alert, cooperative and no distress Extremities: Homans sign is negative, no sign of DVT, no edema, redness or tenderness in the calves or thighs and no ulcers, gangrene or trophic changes  Disposition: Home with follow up in 2 weeks   Follow-up Information    [name], MD. Schedule an appointment as soon as possible for a visit in 2 weeks.   Specialty:  Orthopedic Surgery Contact information: [address] [place] [address] [phone]           Discharge Instructions    Call MD /  Call 911   Complete by:  As directed    If you experience chest pain or shortness of breath, CALL 911 and be transported to the hospital emergency room.  If you develope a fever above 101 F, pus (white drainage) or increased drainage or redness at the wound, or calf pain, call your surgeon's office.   Change dressing   Complete by:  As directed    Maintain surgical dressing until follow up in the clinic. If the edges start to pull up, may reinforce with tape. If the dressing is no longer working, may remove and cover with gauze and tape, but must keep the area dry and clean.  Call with any questions or concerns.   Constipation Prevention   Complete by:  As directed     Drink plenty of fluids.  Prune juice may be helpful.  You may use a stool softener, such as Colace (over the counter) 100 mg twice a day.  Use MiraLax (over the counter) for constipation as needed.   Diet - low sodium heart healthy   Complete by:  As directed    Discharge instructions   Complete by:  As directed    Maintain surgical dressing until follow up in the clinic. If the edges start to pull up, may reinforce with tape. If the dressing is no longer working, may remove and cover with gauze and tape, but must keep the area dry and clean.  Follow up in 2 weeks at [facility]. Call with any questions or concerns.   Increase activity slowly as tolerated   Complete by:  As directed    Weight bearing as tolerated with assist device (walker, cane, etc) as directed, use it as long as suggested by your surgeon or therapist, typically at least 4-6 weeks.   TED hose   Complete by:  As directed    Use stockings (TED hose) for 2 weeks on both leg(s).  You may remove them at night for sleeping.      Allergies as of 01/20/2018      Reactions   Sulfa Antibiotics Other (See Comments)   Unknown/childhood allergy      Medication List    STOP taking these medications   acetaminophen 500 MG tablet Commonly known as:  TYLENOL   diphenhydramine-acetaminophen 25-500 MG Tabs tablet Commonly known as:  TYLENOL PM     TAKE these medications   apixaban 5 MG Tabs tablet Commonly known as:  ELIQUIS Take 5 mg by mouth 2 (two) times daily.   atorvastatin 20 MG tablet Commonly known as:  LIPITOR Take 20 mg by mouth daily at 12 noon.   cyclobenzaprine 5 MG tablet Commonly known as:  FLEXERIL Take 1 tablet (5 mg total) by mouth 3 (three) times daily as needed for muscle spasms.   docusate sodium 100 MG capsule Commonly known as:  COLACE Take 1 capsule (100 mg total) by mouth 2 (two) times daily.   doxycycline 100 MG capsule Commonly known as:  VIBRAMYCIN Take 1 capsule (100 mg total)  by mouth 2 (two) times daily.   ferrous sulfate 325 (65 FE) MG tablet Commonly known as:  FERROUSUL Take 1 tablet (325 mg total) by mouth 3 (three) times daily with meals.   furosemide 40 MG tablet Commonly known as:  LASIX Take 1 tablet (40 mg total) by mouth daily. Take for 14 days only.   gabapentin 300 MG capsule Commonly known as:  NEURONTIN Take 300 mg by mouth 2 (two)  times daily.   HYDROcodone-acetaminophen 7.5-325 MG tablet Commonly known as:  NORCO Take 1-2 tablets by mouth every 4 (four) hours as needed for moderate pain. What changed:    reasons to take this  Another medication with the same name was removed. Continue taking this medication, and follow the directions you see here.   levothyroxine 150 MCG tablet Commonly known as:  SYNTHROID, LEVOTHROID Take 150 mcg by mouth daily before breakfast.   metoprolol tartrate 50 MG tablet Commonly known as:  LOPRESSOR Take 1 tablet (50 mg total) by mouth 2 (two) times daily.   multivitamin with minerals Tabs tablet Take 1 tablet by mouth daily at 12 noon.   pantoprazole 40 MG tablet Commonly known as:  PROTONIX Take 40 mg by mouth daily.   polycarbophil 625 MG tablet Commonly known as:  FIBERCON Take 1,250 mg by mouth daily at 12 noon.   polyethylene glycol packet Commonly known as:  MIRALAX / GLYCOLAX Take 17 g by mouth 2 (two) times daily.   PRESERVISION AREDS 2 PO Take 2 tablets by mouth daily at 12 noon.   valsartan-hydrochlorothiazide 160-25 MG tablet Commonly known as:  DIOVAN-HCT Take 1 tablet by mouth daily.   venlafaxine XR 75 MG 24 hr capsule Commonly known as:  EFFEXOR-XR Take 75 mg by mouth daily with breakfast.   Vitamin D 2000 units Caps Take 2,000 Units by mouth daily at 12 noon.            Discharge Care Instructions  (From admission, onward)        Start     Ordered   01/20/18 0000  Change dressing    Comments:  Maintain surgical dressing until follow up in the clinic. If  the edges start to pull up, may reinforce with tape. If the dressing is no longer working, may remove and cover with gauze and tape, but must keep the area dry and clean.  Call with any questions or concerns.   01/20/18 2836       Signed: West Pugh. Patricie Geeslin   PA-C  01/26/2018, 7:37 AM

## 2018-01-27 DIAGNOSIS — M25562 Pain in left knee: Secondary | ICD-10-CM | POA: Diagnosis not present

## 2018-01-29 DIAGNOSIS — M25562 Pain in left knee: Secondary | ICD-10-CM | POA: Diagnosis not present

## 2018-02-01 DIAGNOSIS — M25562 Pain in left knee: Secondary | ICD-10-CM | POA: Diagnosis not present

## 2018-02-02 DIAGNOSIS — M25562 Pain in left knee: Secondary | ICD-10-CM | POA: Diagnosis not present

## 2018-02-04 DIAGNOSIS — M25562 Pain in left knee: Secondary | ICD-10-CM | POA: Diagnosis not present

## 2018-02-08 DIAGNOSIS — M25562 Pain in left knee: Secondary | ICD-10-CM | POA: Diagnosis not present

## 2018-02-09 DIAGNOSIS — M25562 Pain in left knee: Secondary | ICD-10-CM | POA: Diagnosis not present

## 2018-02-11 DIAGNOSIS — M25562 Pain in left knee: Secondary | ICD-10-CM | POA: Diagnosis not present

## 2018-02-15 ENCOUNTER — Other Ambulatory Visit: Payer: Self-pay | Admitting: Family Medicine

## 2018-02-15 DIAGNOSIS — M25562 Pain in left knee: Secondary | ICD-10-CM | POA: Diagnosis not present

## 2018-02-18 DIAGNOSIS — M25562 Pain in left knee: Secondary | ICD-10-CM | POA: Diagnosis not present

## 2018-02-22 DIAGNOSIS — M25562 Pain in left knee: Secondary | ICD-10-CM | POA: Diagnosis not present

## 2018-02-24 DIAGNOSIS — M25562 Pain in left knee: Secondary | ICD-10-CM | POA: Diagnosis not present

## 2018-03-03 DIAGNOSIS — M25552 Pain in left hip: Secondary | ICD-10-CM | POA: Diagnosis not present

## 2018-03-03 DIAGNOSIS — Z4789 Encounter for other orthopedic aftercare: Secondary | ICD-10-CM | POA: Diagnosis not present

## 2018-03-03 DIAGNOSIS — M25562 Pain in left knee: Secondary | ICD-10-CM | POA: Diagnosis not present

## 2018-03-03 DIAGNOSIS — Z96652 Presence of left artificial knee joint: Secondary | ICD-10-CM | POA: Diagnosis not present

## 2018-03-10 ENCOUNTER — Other Ambulatory Visit: Payer: Self-pay | Admitting: Family Medicine

## 2018-04-02 ENCOUNTER — Ambulatory Visit: Payer: Medicare HMO | Admitting: Cardiology

## 2018-04-02 ENCOUNTER — Encounter: Payer: Self-pay | Admitting: Cardiology

## 2018-04-02 VITALS — BP 120/78 | HR 73 | Ht 71.0 in | Wt 210.2 lb

## 2018-04-02 DIAGNOSIS — Z7901 Long term (current) use of anticoagulants: Secondary | ICD-10-CM | POA: Diagnosis not present

## 2018-04-02 DIAGNOSIS — I480 Paroxysmal atrial fibrillation: Secondary | ICD-10-CM | POA: Diagnosis not present

## 2018-04-02 DIAGNOSIS — E785 Hyperlipidemia, unspecified: Secondary | ICD-10-CM

## 2018-04-02 DIAGNOSIS — I10 Essential (primary) hypertension: Secondary | ICD-10-CM | POA: Diagnosis not present

## 2018-04-02 NOTE — Patient Instructions (Signed)
Medication Instructions:   Your physician recommends that you continue on your current medications as directed. Please refer to the Current Medication list given to you today.    Follow-Up:  4 MONTHS WITH DR NELSON       If you need a refill on your cardiac medications before your next appointment, please call your pharmacy.   

## 2018-04-02 NOTE — Progress Notes (Signed)
Cardiology Office Note    Date:  04/02/2018   ID:  Eugene White, DOB [birth year], MRN [id number]  PCP:  [name], PA  Cardiologist: Dr. [name]  Chief complain: DOE, left knee swelling  History of Present Illness:  Eugene White is a 72 y.o. male with history of hypertension, HLD, hypothyroidism. He underwent surgery for left femoral nail removal. During surgery he went into atrial fib with RVR which was new for him. 2-D echo showed normal LV function with mildly dilated left atrium.CHADSVASC=2 for hypertension and age and he was started on Eliquis 5 mg BID with plans to cardiovert 4 weeks.  10/13/17 - Patient comes in today accompanied by his wife. His total knee replacement scheduled for November 26. He doesn't feel good and atrial fibrillation since his medications were changed. His blood pressure is running low. He can't feel his heart beating irregular unless he takes his pulse. No fast heart rates. Denies chest pain and was fairly active until he had the nail removed from his hip.  01/08/2018 - this is 3 months follow-up, the patient underwent left knee replacement on 11/24/2017, he completed 3 weeks of Keflex, he denies any fever or chills however has experienced swelling and erythema since the surgery that gets significantly worse since last week. His currently unable to flex his knee or walk properly. He denies any chest pain, or palpitations however with minimal exertion he gets short of breath such as walking flight of stairs. He has been using Eliquis without any interruptions and denies any bleeding. The patient describes paresthesias and numbness in 3 of his right fingers that resolved and his morning. The patient is also complaining of bruit in his left ear.  04/02/2018 - the patient underwent successful cardioversion on 01/12/2018, he is coming for 3 months follow-up, he's been compliant with his medication and denies any palpitations dizziness or fall. Is no side  effects from his medications. He has felt couple PVCs but no sustained tachycardia. He has been active walking with no difficulties with regards to chest pain or shortness of breath, he still recovering from his left knee replacement that required revision for significant hemarthrosis, currently undergoing physical therapy and improving.  Past Medical History:  Diagnosis Date  . Anemia    hx of one time  . Arthritis    OA  . Atrial fibrillation with controlled ventricular rate ([place]) 10/05/2017  . Difficulty sleeping   . Diverticulosis 2014  . Dysrhythmia    atrial fib   . 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 GERD (gastroesophageal reflux disease)   . GI symptom    had nausea / vomited once / frequent stools / getting better  . Hyperlipidemia   . Hypertension   . Hypothyroidism   . Mood changes   . Pneumonia    hx of  . Thyroid disease    hypo    Past Surgical History:  Procedure Laterality Date  . CARDIOVERSION N/A 01/12/2018   Procedure: CARDIOVERSION;  Surgeon: Dorothy Spark, MD;  Location: [facility] ENDOSCOPY;  Service: Cardiovascular;  Laterality: N/A;  . COLONOSCOPY  2014  . FEMUR IM NAIL Left 10/11/2016   Procedure: INTRAMEDULLARY (IM) NAIL FEMORAL;  Surgeon: [name], MD;  Location: [facility] ORS;  Service: Orthopedics;  Laterality: Left;  . HAND SURGERY   1973 / 2010 / 2015   right to release tendons  . HARDWARE REMOVAL Left 10/05/2017  Procedure: Removal of left femoral nail;  Surgeon: [name], MD;  Location: [facility] ORS;  Service: Orthopedics;  Laterality: Left;  90 mins  . HEMATOMA EVACUATION Left 01/19/2018   Procedure: Irrigation and debridement, Evacuation of left total knee hematoma;  Surgeon: [name], MD;  Location: [facility] ORS;  Service: Orthopedics;  Laterality: Left;  . KNEE ARTHROSCOPY  1984 & 2001   twice  . NASAL SEPTUM SURGERY    . removal Left femoral nail      10/05/17 Dr. [name]  . [place] &  2006   Right and left  . TEE WITH CARDIOVERSION  01/12/2018  . TEE WITHOUT CARDIOVERSION N/A 01/12/2018   Procedure: TRANSESOPHAGEAL ECHOCARDIOGRAM (TEE);  Surgeon: Dorothy Spark, MD;  Location: [facility] ENDOSCOPY;  Service: Cardiovascular;  Laterality: N/A;  . TONSILLECTOMY    . TOTAL KNEE ARTHROPLASTY Right 01/09/2015   Procedure: RIGHT TOTAL KNEE ARTHROPLASTY;  Surgeon: [name], MD;  Location: [facility] ORS;  Service: Orthopedics;  Laterality: Right;  . TOTAL KNEE ARTHROPLASTY Left 11/23/2017   Procedure: LEFT TOTAL KNEE ARTHROPLASTY;  Surgeon: [name], MD;  Location: [facility] ORS;  Service: Orthopedics;  Laterality: Left;  90 mins    Current Medications: Current Meds  Medication Sig  . amLODipine (NORVASC) 5 MG tablet TAKE 1 TABLET EVERY DAY  . apixaban (ELIQUIS) 5 MG TABS tablet Take 5 mg by mouth 2 (two) times daily.  Marland Kitchen atorvastatin (LIPITOR) 20 MG tablet TAKE 1 TABLET EVERY DAY  . Cholecalciferol (VITAMIN D) 2000 UNITS CAPS Take 2,000 Units by mouth daily at 12 noon.   . ferrous sulfate (FERROUSUL) 325 (65 FE) MG tablet Take 1 tablet (325 mg total) by mouth 3 (three) times daily with meals.  Marland Kitchen levothyroxine (SYNTHROID, LEVOTHROID) 150 MCG tablet TAKE 1 TABLET EVERY DAY  . metoprolol tartrate (LOPRESSOR) 50 MG tablet Take 1 tablet (50 mg total) by mouth 2 (two) times daily.  . Multiple Vitamin (MULTIVITAMIN WITH MINERALS) TABS tablet Take 1 tablet by mouth daily at 12 noon.   . pantoprazole (PROTONIX) 40 MG tablet TAKE 1 TABLET EVERY DAY  . polycarbophil (FIBERCON) 625 MG tablet Take 1,250 mg by mouth daily at 12 noon.   . valsartan-hydrochlorothiazide (DIOVAN-HCT) 160-25 MG tablet TAKE 1 TABLET EVERY DAY  . venlafaxine XR (EFFEXOR-XR) 75 MG 24 hr capsule TAKE 1 CAPSULE EVERY DAY     Allergies:   Sulfa antibiotics   Social History   Socioeconomic History  . Marital status: Married    Spouse name: Not on file  . Number of children: Not on file  . Years of education: Not on file   . Highest education level: Not on file  Occupational History  . Occupation: retired, has degree in physiology  Social Needs  . Financial resource strain: Not on file  . Food insecurity:    Worry: Not on file    Inability: Not on file  . Transportation needs:    Medical: Not on file    Non-medical: Not on file  Tobacco Use  . Smoking status: Former Smoker    Last attempt to quit: 12/29/1968    Years since quitting: 49.2  . Smokeless tobacco: Never Used  Substance and Sexual Activity  . Alcohol use: Yes    Alcohol/week: 7.2 oz    Types: 12 Cans of beer per week    Comment: occasiona  . Drug use: No  . Sexual activity: Yes  Lifestyle  . Physical activity:  Days per week: Not on file    Minutes per session: Not on file  . Stress: Not on file  Relationships  . Social connections:    Talks on phone: Not on file    Gets together: Not on file    Attends religious service: Not on file    Active member of club or organization: Not on file    Attends meetings of clubs or organizations: Not on file    Relationship status: Not on file  Other Topics Concern  . Not on file  Social History Narrative   Pt lives in [place] w/ wife.     Family History:  The patient's family history includes Breast cancer in his mother.   ROS:   Please see the history of present illness.    Review of Systems  Constitution: Negative.  HENT: Negative.   Cardiovascular: Positive for irregular heartbeat.  Respiratory: Negative.   Endocrine: Negative.   Hematologic/Lymphatic: Negative.   Musculoskeletal: Positive for arthritis, joint pain, joint swelling, myalgias and stiffness.  Gastrointestinal: Negative.   Genitourinary: Negative.   Neurological: Negative.    All other systems reviewed and are negative.   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 Neck: no JVD, carotid bruits, or masses Cardiac: RRR; no murmurs, rubs, or gallops  Respiratory:  clear to auscultation bilaterally, normal work of breathing GI: soft, nontender, nondistended, + BS Ext: without cyanosis, clubbing, significant edema and erythema and warmth of left knee with no discharge, Good distal pulses bilaterally Neuro:  Alert and Oriented x 3 Psych: euthymic mood, full affect  Wt Readings from Last 3 Encounters:  04/02/18 210 lb 3.2 oz (95.3 kg)  01/19/18 203 lb (92.1 kg)  01/12/18 203 lb (92.1 kg)    Studies/Labs Reviewed:   EKG:  EKG is  ordered today 01/08/2018 it shows atrial fibrillation with ventricular rate 97 bpm, PVC that is new, this was personally reviewed.  Recent Labs: 01/08/2018: ALT 23; Magnesium 1.8; TSH 1.520 01/20/2018: Hemoglobin 10.9; Platelets 354 01/25/2018: BUN 13; Creatinine, Ser 0.63; NT-Pro BNP 216; Potassium 4.3; Sodium 138   Lipid Panel    Component Value Date/Time   CHOL 192 10/11/2016 0337   TRIG 471 (H) 10/11/2016 0337   HDL 51 10/11/2016 0337   CHOLHDL 3.8 10/11/2016 0337   VLDL UNABLE TO CALCULATE IF TRIGLYCERIDE OVER 400 mg/dL 10/11/2016 0337   LDLCALC UNABLE TO CALCULATE IF TRIGLYCERIDE OVER 400 mg/dL 10/11/2016 6063   Additional studies/ records that were reviewed today include:   2D Echo 10/05/17  - Left ventricle: The cavity size was normal. Wall thickness was   increased in a pattern of mild LVH. Systolic function was normal.   The estimated ejection fraction was in the range of 55% to 60%.   Wall motion was normal; there were no regional wall motion   abnormalities. - Left atrium: The atrium was mildly dilated.   Impressions: - Normal LV function; mild LVH; mild LAE.  TEE: 01/12/2018 - Left ventricle: Systolic function was normal. The estimated   ejection fraction was in the range of 55% to 60%. Wall motion was   normal; there were no regional  wall motion abnormalities. - Aortic  valve: There was mild regurgitation. - Mitral valve: There was moderate regurgitation. - Left atrium: The atrium was dilated. No evidence of thrombus in   the atrial cavity or appendage. No evidence of thrombus in the   atrial cavity or appendage. No evidence of thrombus in the atrial   cavity or appendage. - Right atrium: The atrium was dilated. No evidence of thrombus in   the atrial cavity or appendage. - Atrial septum: No defect or patent foramen ovale was identified. - Tricuspid valve: There was moderate regurgitation.  Impressions:  - No cardiac source of emboli was indentified. The study was   followed by a successful cardioversion.    Patient Profile     Eugene White is a 72 y.o. 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 He had a mechanical fall 09/2016 resulting in hip fracture, treated with surgery with left femoral nail. Unfortunately, he has had significant pain from the nail and came in for elective removal. During surgery, pt was noted to go into afib w/ RVR.   ASSESSMENT:    1. Essential hypertension   2. PAF (paroxysmal atrial fibrillation) ([place])   3. Hyperlipidemia, unspecified hyperlipidemia type    PLAN:  In order of problems listed above:  1. Atrial fibrillation in the setting of knee replacement and infection, status post successful  Cardioversion on general 15 2019. He remains in sinus rhythm. He has no significant bleeding with Eliquis, However he doesn't like it as it causing additional bleeding in his surgical site post knee replacement. However is agreeable to continue.  2. Essential hypertension blood pressure well controlled, continue current regimen  3. Hyperlipidemia on atorvastatin 20 mg daily that is tolerated well.  4. Left carotid bruit - he has minimal bilateral carotid stenosis.  Follow up in 4 months.  Medication Adjustments/Labs and  Tests Ordered: Current medicines are reviewed at length with the patient today.  Concerns regarding medicines are outlined above.  Medication changes, Labs and Tests ordered today are listed in the Patient Instructions below. Patient Instructions  Medication Instructions:   Your physician recommends that you continue on your current medications as directed. Please refer to the Current Medication list given to you today.    Follow-Up:  4 MONTHS WITH DR [name]       If you need a refill on your cardiac medications before your next appointment, please call your pharmacy.      Signed, Ena Dawley, MD  04/02/2018 3:17 PM    [facility] [place], [place], [place]  [address] Phone: [phone]; Fax: [phone]

## 2018-04-04 ENCOUNTER — Other Ambulatory Visit: Payer: Self-pay | Admitting: Cardiology

## 2018-04-05 NOTE — Telephone Encounter (Signed)
Eliquis 5mg  refill request received; pt is 72 yrs old, wt-95.3kg, Crea-0.63 on 01/25/18, last seen by Dr. [name] on 04/02/18; will send in refill to requested pharmacy.

## 2018-04-08 ENCOUNTER — Telehealth: Payer: Self-pay | Admitting: Cardiology

## 2018-04-08 NOTE — Telephone Encounter (Signed)
New Message   Pt c/o medication issue:  1. Name of Medication: amLODipine (NORVASC) 5 MG tablet  2. How are you currently taking this medication (dosage and times per day)? 5mg  once a day   3. Are you having a reaction (difficulty breathing--STAT)?   4. What is your medication issue? Patients wife is calling in reference to patient medication. She states that she is reviewing his medication list. She noticed that amlodipine was on the sheet but the patient does not take. She said he stop taking it in October of 2018. They want to know should he resume taking the amlodipine. Please call to discuss.

## 2018-04-08 NOTE — Telephone Encounter (Signed)
Wife wanting to call to tell Dr [name] that they forgot to mention at the pts OV this week, that he is no longer taking amlodipine.  This was held at his last visit to the hospital, due to low BP, and he never resumed back taking this. He is taking all his other meds as prescribed.  His vital signs at the visit with [place] was normal, and wife states they monitor this daily, and it runs normal in the 120s/80s.  Wife would like this med removed from his medication list, if Dr [name] is ok with that? Informed the pts wife that I will make her aware of this and remove this off his list.  Advised the pts wife to have him take all his other meds as prescribed.  Wife verbalized understanding and agrees with this plan.

## 2018-04-12 NOTE — Telephone Encounter (Signed)
Its ok.  

## 2018-04-12 NOTE — Telephone Encounter (Signed)
Spoke with the pts wife and informed her that Dr [name] recommends that we can discontinue the pt taking amlodipine and we will take this off his med list.  Wife verbalized understanding and agrees with this plan.

## 2018-04-14 DIAGNOSIS — M25552 Pain in left hip: Secondary | ICD-10-CM | POA: Diagnosis not present

## 2018-04-14 DIAGNOSIS — Z4789 Encounter for other orthopedic aftercare: Secondary | ICD-10-CM | POA: Diagnosis not present

## 2018-04-14 DIAGNOSIS — Z96659 Presence of unspecified artificial knee joint: Secondary | ICD-10-CM | POA: Diagnosis not present

## 2018-04-14 DIAGNOSIS — M7062 Trochanteric bursitis, left hip: Secondary | ICD-10-CM | POA: Diagnosis not present

## 2018-05-18 ENCOUNTER — Ambulatory Visit (INDEPENDENT_AMBULATORY_CARE_PROVIDER_SITE_OTHER): Payer: Medicare HMO | Admitting: Family Medicine

## 2018-05-18 ENCOUNTER — Encounter: Payer: Self-pay | Admitting: Family Medicine

## 2018-05-18 VITALS — BP 146/100 | HR 65 | Temp 98.9°F | Resp 16 | Ht 71.0 in | Wt 203.0 lb

## 2018-05-18 DIAGNOSIS — J069 Acute upper respiratory infection, unspecified: Secondary | ICD-10-CM | POA: Diagnosis not present

## 2018-05-18 MED ORDER — HYDROCODONE-HOMATROPINE 5-1.5 MG/5ML PO SYRP: 5.0000 mL | ORAL_SOLUTION | Freq: Four times a day (QID) | ORAL | 0 refills | Status: AC | PRN

## 2018-05-18 NOTE — Patient Instructions (Signed)
Continue Mucinex DM for daytime use.

## 2018-05-18 NOTE — Progress Notes (Signed)
  Subjective:     Patient ID: Eugene White, male   DOB: 1946-05-09, 72 y.o.   MRN: [id number] Chief Complaint  Patient presents with  . Cough    X 4 days   . Wheezing   HPI States he developed cold sx about 5-6 days ago while returning from a cruise by air. Reports sinuses are improving but has continued with upper chest congestion and cough productive of clear sputum. No fever or chills reported. Has been taking Mucinex DM for his sx. Accompanied by his wife today.  Review of Systems     Objective:   Physical Exam  Constitutional: He appears well-developed and well-nourished. No distress.  Ears: T.M.'s obscured by cerumen Throat: no tonsillar enlargement or exudate Neck: no cervical adenopathy Lungs: clear     Assessment:    1. Viral upper respiratory tract infection - HYDROcodone-homatropine (HYCODAN) 5-1.5 MG/5ML syrup; Take 5 mLs by mouth every 6 (six) hours as needed for up to 5 days. 5 ml 4-6 hours as needed for cough  Dispense: 100 mL; Refill: 0    Plan:    Continue Mucinex DM. Call if not continuing to improve.

## 2018-07-27 ENCOUNTER — Other Ambulatory Visit: Payer: Self-pay | Admitting: Cardiology

## 2018-08-12 DIAGNOSIS — H40003 Preglaucoma, unspecified, bilateral: Secondary | ICD-10-CM | POA: Diagnosis not present

## 2018-08-18 ENCOUNTER — Encounter: Payer: Self-pay | Admitting: Cardiology

## 2018-08-18 ENCOUNTER — Ambulatory Visit: Payer: Medicare HMO | Admitting: Cardiology

## 2018-08-18 VITALS — BP 140/99 | HR 58 | Ht 71.0 in | Wt 211.6 lb

## 2018-08-18 DIAGNOSIS — E785 Hyperlipidemia, unspecified: Secondary | ICD-10-CM | POA: Diagnosis not present

## 2018-08-18 DIAGNOSIS — I480 Paroxysmal atrial fibrillation: Secondary | ICD-10-CM | POA: Diagnosis not present

## 2018-08-18 DIAGNOSIS — Z7901 Long term (current) use of anticoagulants: Secondary | ICD-10-CM | POA: Diagnosis not present

## 2018-08-18 NOTE — Progress Notes (Signed)
Cardiology Office Note    Date:  08/18/2018   ID:  Eugene White, DOB Apr 23, 1946, MRN [id number]  PCP:  [name], PA  Cardiologist: Dr. [name]  Chief complain: DOE, left knee swelling  History of Present Illness:  Eugene White is a 72 y.o. male with history of hypertension, HLD, hypothyroidism. He underwent surgery for left femoral nail removal. During surgery he went into atrial fib with RVR which was new for him. 2-D echo showed normal LV function with mildly dilated left atrium.CHADSVASC=2 for hypertension and age and he was started on Eliquis 5 mg BID with plans to cardiovert 4 weeks.  10/13/17 - Patient comes in today accompanied by his wife. His total knee replacement scheduled for November 26. He doesn't feel good and atrial fibrillation since his medications were changed. His blood pressure is running low. He can't feel his heart beating irregular unless he takes his pulse. No fast heart rates. Denies chest pain and was fairly active until he had the nail removed from his hip.  01/08/2018 - this is 3 months follow-up, the patient underwent left knee replacement on 11/24/2017, he completed 3 weeks of Keflex, he denies any fever or chills however has experienced swelling and erythema since the surgery that gets significantly worse since last week. His currently unable to flex his knee or walk properly. He denies any chest pain, or palpitations however with minimal exertion he gets short of breath such as walking flight of stairs. He has been using Eliquis without any interruptions and denies any bleeding. The patient describes paresthesias and numbness in 3 of his right fingers that resolved and his morning. The patient is also complaining of bruit in his left ear.  04/02/2018 - the patient underwent successful cardioversion on 01/12/2018, he is coming for 3 months follow-up, he's been compliant with his medication and denies any palpitations dizziness or fall. Is no side  effects from his medications. He has felt couple PVCs but no sustained tachycardia. He has been active walking with no difficulties with regards to chest pain or shortness of breath, he still recovering from his left knee replacement that required revision for significant hemarthrosis, currently undergoing physical therapy and improving.  08/18/2018 -the patient is coming after 4 months, he is feeling great, denies any chest pain shortness of breath, he is starting to have bruising with Eliquis and would like to discontinue.  He denies any palpitations or irregular beats dizziness or falls.  Past Medical History:  Diagnosis Date  . 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 Anemia    hx of one time  . Arthritis    OA  . Atrial fibrillation with controlled ventricular rate ([place]) 10/05/2017  . Benign essential HTN 05/31/2009  . Closed intertrochanteric fracture of hip, left, initial encounter ([place]) 10/10/2016  . Colon, diverticulosis 05/31/2009  . Difficulty sleeping   . Diverticulitis large intestine 05/31/2009  . Diverticulosis 2014  . Dysrhythmia    atrial fib   . Essential (primary) hypertension 05/31/2009  . 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 GERD (gastroesophageal reflux disease)   . GI symptom    had nausea / vomited once / frequent stools / getting better  . Hyperlipidemia   . Hypertension   . Hypothyroidism   . Mood changes   . Pneumonia    hx of  . Thyroid disease    hypo    Past Surgical History:  Procedure Laterality Date  .  CARDIOVERSION N/A 01/12/2018   Procedure: CARDIOVERSION;  Surgeon: Dorothy Spark, MD;  Location: [facility] ENDOSCOPY;  Service: Cardiovascular;  Laterality: N/A;  . COLONOSCOPY  2014  . FEMUR IM NAIL Left 10/11/2016   Procedure: INTRAMEDULLARY (IM) NAIL FEMORAL;  Surgeon: [name], MD;  Location: [facility] ORS;  Service: Orthopedics;  Laterality: Left;  . HAND SURGERY   1973 / 2010 / 2015   right to release tendons  . HARDWARE REMOVAL  Left 10/05/2017   Procedure: Removal of left femoral nail;  Surgeon: [name], MD;  Location: [facility] ORS;  Service: Orthopedics;  Laterality: Left;  90 mins  . HEMATOMA EVACUATION Left 01/19/2018   Procedure: Irrigation and debridement, Evacuation of left total knee hematoma;  Surgeon: [name], MD;  Location: [facility] ORS;  Service: Orthopedics;  Laterality: Left;  . KNEE ARTHROSCOPY  1984 & 2001   twice  . NASAL SEPTUM SURGERY    . removal Left femoral nail      10/05/17 Dr. [name]  . [place] & 2006   Right and left  . TEE WITH CARDIOVERSION  01/12/2018  . TEE WITHOUT CARDIOVERSION N/A 01/12/2018   Procedure: TRANSESOPHAGEAL ECHOCARDIOGRAM (TEE);  Surgeon: Dorothy Spark, MD;  Location[facility] ENDOSCOPY;  Service: Cardiovascular;  Laterality: N/A;  . TONSILLECTOMY    . TOTAL KNEE ARTHROPLASTY Right 01/09/2015   Procedure: RIGHT TOTAL KNEE ARTHROPLASTY;  Surgeon: [name], MD;  Location: [facility] ORS;  Service: Orthopedics;  Laterality: Right;  . TOTAL KNEE ARTHROPLASTY Left 11/23/2017   Procedure: LEFT TOTAL KNEE ARTHROPLASTY;  Surgeon: [name], MD;  Location: [facility] ORS;  Service: Orthopedics;  Laterality: Left;  90 mins    Current Medications: Current Meds  Medication Sig  . atorvastatin (LIPITOR) 20 MG tablet TAKE 1 TABLET EVERY DAY  . Cholecalciferol (VITAMIN D) 2000 UNITS CAPS Take 2,000 Units by mouth daily at 12 noon.   Marland Kitchen levothyroxine (SYNTHROID, LEVOTHROID) 150 MCG tablet TAKE 1 TABLET EVERY DAY  . metoprolol tartrate (LOPRESSOR) 50 MG tablet TAKE 1 TABLET TWICE DAILY  . Multiple Vitamin (MULTIVITAMIN WITH MINERALS) TABS tablet Take 1 tablet by mouth daily at 12 noon.   . pantoprazole (PROTONIX) 40 MG tablet TAKE 1 TABLET EVERY DAY  . polycarbophil (FIBERCON) 625 MG tablet Take 1,250 mg by mouth daily at 12 noon.   . valsartan-hydrochlorothiazide (DIOVAN-HCT) 160-25 MG tablet TAKE 1 TABLET EVERY DAY  . venlafaxine XR (EFFEXOR-XR) 75 MG 24 hr  capsule TAKE 1 CAPSULE EVERY DAY  . [DISCONTINUED] ELIQUIS 5 MG TABS tablet TAKE 1 TABLET (5 MG TOTAL) BY MOUTH 2 (TWO) TIMES DAILY.     Allergies:   Sulfa antibiotics   Social History   Socioeconomic History  . Marital status: Married    Spouse name: Not on file  . Number of children: Not on file  . Years of education: Not on file  . Highest education level: Not on file  Occupational History  . Occupation: retired, has degree in physiology  Social Needs  . Financial resource strain: Not on file  . Food insecurity:    Worry: Not on file    Inability: Not on file  . Transportation needs:    Medical: Not on file    Non-medical: Not on file  Tobacco Use  . Smoking status: Former Smoker    Last attempt to quit: 12/29/1968    Years since quitting: 49.6  . Smokeless tobacco: Never Used  Substance and Sexual  Activity  . Alcohol use: Yes    Alcohol/week: 12.0 standard drinks    Types: 12 Cans of beer per week    Comment: occasiona  . Drug use: No  . Sexual activity: Yes  Lifestyle  . Physical activity:    Days per week: Not on file    Minutes per session: Not on file  . Stress: Not on file  Relationships  . Social connections:    Talks on phone: Not on file    Gets together: Not on file    Attends religious service: Not on file    Active member of club or organization: Not on file    Attends meetings of clubs or organizations: Not on file    Relationship status: Not on file  Other Topics Concern  . Not on file  Social History Narrative   Pt lives in [place] w/ wife.     Family History:  The patient's family history includes Breast cancer in his mother.   ROS:   Please see the history of present illness.    Review of Systems  Constitution: Negative.  HENT: Negative.   Cardiovascular: Positive for irregular heartbeat.  Respiratory: Negative.   Endocrine: Negative.   Hematologic/Lymphatic: Negative.   Musculoskeletal: Positive for arthritis, joint pain, joint  swelling, myalgias and stiffness.  Gastrointestinal: Negative.   Genitourinary: Negative.   Neurological: Negative.    All other systems reviewed and are negative.   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 Neck: no JVD, carotid bruits, or masses Cardiac: RRR; no murmurs, rubs, or gallops  Respiratory:  clear to auscultation bilaterally, normal work of breathing GI: soft, nontender, nondistended, + BS Ext: without cyanosis, clubbing, significant edema and erythema and warmth of left knee with no discharge, Good distal pulses bilaterally Neuro:  Alert and Oriented x 3 Psych: euthymic mood, full affect  Wt Readings from Last 3 Encounters:  08/18/18 211 lb 9.6 oz (96 kg)  05/18/18 203 lb (92.1 kg)  04/02/18 210 lb 3.2 oz (95.3 kg)    Studies/Labs Reviewed:   EKG:  EKG is  ordered today 01/08/2018 it shows atrial fibrillation with ventricular rate 97 bpm, PVC that is new, this was personally reviewed.  Recent Labs: 01/08/2018: ALT 23; Magnesium 1.8; TSH 1.520 01/20/2018: Hemoglobin 10.9; Platelets 354 01/25/2018: BUN 13; Creatinine, Ser 0.63; NT-Pro BNP 216; Potassium 4.3; Sodium 138   Lipid Panel    Component Value Date/Time   CHOL 192 10/11/2016 0337   TRIG 471 (H) 10/11/2016 0337   HDL 51 10/11/2016 0337   CHOLHDL 3.8 10/11/2016 0337   VLDL UNABLE TO CALCULATE IF TRIGLYCERIDE OVER 400 mg/dL 10/11/2016 0337   LDLCALC UNABLE TO CALCULATE IF TRIGLYCERIDE OVER 400 mg/dL 10/11/2016 7253   Additional studies/ records that were reviewed today include:   2D Echo 10/05/17  - Left ventricle: The cavity size was normal. Wall thickness was   increased in a pattern of mild LVH. Systolic function was normal.   The estimated ejection fraction was in the range of 55% to  60%.   Wall motion was normal; there were no regional wall motion   abnormalities. - Left atrium: The atrium was mildly  dilated.   Impressions: - Normal LV function; mild LVH; mild LAE.  TEE: 01/12/2018 - Left ventricle: Systolic function was normal. The estimated   ejection fraction was in the range of 55% to 60%. Wall motion was   normal; there were no regional wall motion abnormalities. - Aortic valve: There was mild regurgitation. - Mitral valve: There was moderate regurgitation. - Left atrium: The atrium was dilated. No evidence of thrombus in   the atrial cavity or appendage. No evidence of thrombus in the   atrial cavity or appendage. No evidence of thrombus in the atrial   cavity or appendage. - Right atrium: The atrium was dilated. No evidence of thrombus in   the atrial cavity or appendage. - Atrial septum: No defect or patent foramen ovale was identified. - Tricuspid valve: There was moderate regurgitation.  Impressions:  - No cardiac source of emboli was indentified. The study was   followed by a successful cardioversion.    Patient Profile     NANA HOSELTON is a 72 y.o. 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 He had a mechanical fall 09/2016 resulting in hip fracture, treated with surgery with left femoral nail. Unfortunately, he has had significant pain from the nail and came in for elective removal. During surgery, pt was noted to go into afib w/ RVR.   ASSESSMENT:    1. PAF (paroxysmal atrial fibrillation) ([place])   2. Chronic anticoagulation   3. Hyperlipidemia, unspecified hyperlipidemia type    PLAN:  In order of problems listed above:  1. Atrial fibrillation in the setting of knee replacement and infection, status post successful cardioversion on 01/22/2018.  No recurrent atrial fibrillation, side effects from Eliquis, he has normal LVEF, and one isolated episode of atrial fibrillation, for  now we will discontinue Eliquis he understands the risks, he will call us if he has any recurrent atrial fibrillation.    2. Essential hypertension blood pressure well controlled, he has whitecoat syndrome, at home 130/80, continue current regimen  3. Hyperlipidemia on atorvastatin 20 mg daily that is tolerated well.  4. Left carotid bruit - he has minimal bilateral carotid stenosis.  Follow up in 6 months.  Medication Adjustments/Labs and Tests Ordered: Current medicines are reviewed at length with the patient today.  Concerns regarding medicines are outlined above.  Medication changes, Labs and Tests ordered today are listed in the Patient Instructions below. Patient Instructions  Medication Instructions:   STOP TAKING ELIQUIS NOW     Follow-Up:  Your physician wants you to follow-up in: [place] will receive a reminder letter in the mail two months in advance. If you don't receive a letter, please call our office to schedule the follow-up appointment.        If you need a refill on your cardiac medications before your next appointment, please call your pharmacy.      Signed, Ena Dawley, MD  08/18/2018 2:31 PM    [address], [place], [place]  [address] Phone: [phone]; Fax: [phone]

## 2018-08-18 NOTE — Patient Instructions (Signed)
Medication Instructions:   STOP TAKING ELIQUIS NOW     Follow-Up:  Your physician wants you to follow-up in: [place] will receive a reminder letter in the mail two months in advance. If you don't receive a letter, please call our office to schedule the follow-up appointment.        If you need a refill on your cardiac medications before your next appointment, please call your pharmacy.

## 2018-09-29 ENCOUNTER — Ambulatory Visit: Payer: Self-pay

## 2018-09-30 ENCOUNTER — Encounter: Payer: Self-pay | Admitting: Family Medicine

## 2018-10-08 ENCOUNTER — Ambulatory Visit (INDEPENDENT_AMBULATORY_CARE_PROVIDER_SITE_OTHER): Payer: Medicare HMO

## 2018-10-08 VITALS — BP 190/88 | HR 65 | Temp 98.3°F | Ht 71.0 in | Wt 221.2 lb

## 2018-10-08 DIAGNOSIS — Z0000 Encounter for general adult medical examination without abnormal findings: Secondary | ICD-10-CM | POA: Diagnosis not present

## 2018-10-08 NOTE — Patient Instructions (Addendum)
[name] , Thank you for taking time to come for your Medicare Wellness Visit. I appreciate your ongoing commitment to your health goals. Please review the following plan we discussed and let me know if I can assist you in the future.   Screening recommendations/referrals: Colonoscopy: Up to date Recommended yearly ophthalmology/optometry visit for glaucoma screening and checkup Recommended yearly dental visit for hygiene and checkup  Vaccinations: Influenza vaccine: Up to date Pneumococcal vaccine: Up to date Tdap vaccine: Up to date Shingles vaccine: Pt declines today.     Advanced directives: On file.   Conditions/risks identified: Recommend to continue trying to increase water intake to 4-6 glasses a day/   Next appointment: 10/11/18 @ 10:40 AM with Eugene White.   Preventive Care 18 Years and Older, Male Preventive care refers to lifestyle choices and visits with your health care provider that can promote health and wellness. What does preventive care include?  A yearly physical exam. This is also called an annual well check.  Dental exams once or twice a year.  Routine eye exams. Ask your health care provider how often you should have your eyes checked.  Personal lifestyle choices, including:  Daily care of your teeth and gums.  Regular physical activity.  Eating a healthy diet.  Avoiding tobacco and drug use.  Limiting alcohol use.  Practicing safe sex.  Taking low doses of aspirin every day.  Taking vitamin and mineral supplements as recommended by your health care provider. What happens during an annual well check? The services and screenings done by your health care provider during your annual well check will depend on your age, overall health, lifestyle risk factors, and family history of disease. Counseling  Your health care provider may ask you questions about your:  Alcohol use.  Tobacco use.  Drug use.  Emotional well-being.  Home and  relationship well-being.  Sexual activity.  Eating habits.  History of falls.  Memory and ability to understand (cognition).  Work and work Statistician. Screening  You may have the following tests or measurements:  Height, weight, and BMI.  Blood pressure.  Lipid and cholesterol levels. These may be checked every 5 years, or more frequently if you are over 35 years old.  Skin check.  Lung cancer screening. You may have this screening every year starting at age 72 if you have a 30-pack-year history of smoking and currently smoke or have quit within the past 15 years.  Fecal occult blood test (FOBT) of the stool. You may have this test every year starting at age 68.  Flexible sigmoidoscopy or colonoscopy. You may have a sigmoidoscopy every 5 years or a colonoscopy every 10 years starting at age 50.  Prostate cancer screening. Recommendations will vary depending on your family history and other risks.  Hepatitis C blood test.  Hepatitis B blood test.  Sexually transmitted disease (STD) testing.  Diabetes screening. This is done by checking your blood sugar (glucose) after you have not eaten for a while (fasting). You may have this done every 1-3 years.  Abdominal aortic aneurysm (AAA) screening. You may need this if you are a current or former smoker.  Osteoporosis. You may be screened starting at age 44 if you are at high risk. Talk with your health care provider about your test results, treatment options, and if necessary, the need for more tests. Vaccines  Your health care provider may recommend certain vaccines, such as:  Influenza vaccine. This is recommended every year.  Tetanus, diphtheria,  and acellular pertussis (Tdap, Td) vaccine. You may need a Td booster every 10 years.  Zoster vaccine. You may need this after age 80.  Pneumococcal 13-valent conjugate (PCV13) vaccine. One dose is recommended after age 51.  Pneumococcal polysaccharide (PPSV23) vaccine. One  dose is recommended after age 72. Talk to your health care provider about which screenings and vaccines you need and how often you need them. This information is not intended to replace advice given to you by your health care provider. Make sure you discuss any questions you have with your health care provider. Document Released: 01/11/2016 Document Revised: 09/03/2016 Document Reviewed: 10/16/2015 Elsevier Interactive Patient Education  2017 [place] Prevention in the Home Falls can cause injuries. They can happen to people of all ages. There are many things you can do to make your home safe and to help prevent falls. What can I do on the outside of my home?  Regularly fix the edges of walkways and driveways and fix any cracks.  Remove anything that might make you trip as you walk through a door, such as a raised step or threshold.  Trim any bushes or trees on the path to your home.  Use bright outdoor lighting.  Clear any walking paths of anything that might make someone trip, such as rocks or tools.  Regularly check to see if handrails are loose or broken. Make sure that both sides of any steps have handrails.  Any raised decks and porches should have guardrails on the edges.  Have any leaves, snow, or ice cleared regularly.  Use sand or salt on walking paths during winter.  Clean up any spills in your garage right away. This includes oil or grease spills. What can I do in the bathroom?  Use night lights.  Install grab bars by the toilet and in the tub and shower. Do not use towel bars as grab bars.  Use non-skid mats or decals in the tub or shower.  If you need to sit down in the shower, use a plastic, non-slip stool.  Keep the floor dry. Clean up any water that spills on the floor as soon as it happens.  Remove soap buildup in the tub or shower regularly.  Attach bath mats securely with double-sided non-slip rug tape.  Do not have throw rugs and other  things on the floor that can make you trip. What can I do in the bedroom?  Use night lights.  Make sure that you have a light by your bed that is easy to reach.  Do not use any sheets or blankets that are too big for your bed. They should not hang down onto the floor.  Have a firm chair that has side arms. You can use this for support while you get dressed.  Do not have throw rugs and other things on the floor that can make you trip. What can I do in the kitchen?  Clean up any spills right away.  Avoid walking on wet floors.  Keep items that you use a lot in easy-to-reach places.  If you need to reach something above you, use a strong step stool that has a grab bar.  Keep electrical cords out of the way.  Do not use floor polish or wax that makes floors slippery. If you must use wax, use non-skid floor wax.  Do not have throw rugs and other things on the floor that can make you trip. What can I do with my  stairs?  Do not leave any items on the stairs.  Make sure that there are handrails on both sides of the stairs and use them. Fix handrails that are broken or loose. Make sure that handrails are as long as the stairways.  Check any carpeting to make sure that it is firmly attached to the stairs. Fix any carpet that is loose or worn.  Avoid having throw rugs at the top or bottom of the stairs. If you do have throw rugs, attach them to the floor with carpet tape.  Make sure that you have a light switch at the top of the stairs and the bottom of the stairs. If you do not have them, ask someone to add them for you. What else can I do to help prevent falls?  Wear shoes that:  Do not have high heels.  Have rubber bottoms.  Are comfortable and fit you well.  Are closed at the toe. Do not wear sandals.  If you use a stepladder:  Make sure that it is fully opened. Do not climb a closed stepladder.  Make sure that both sides of the stepladder are locked into place.  Ask  someone to hold it for you, if possible.  Clearly mark and make sure that you can see:  Any grab bars or handrails.  First and last steps.  Where the edge of each step is.  Use tools that help you move around (mobility aids) if they are needed. These include:  Canes.  Walkers.  Scooters.  Crutches.  Turn on the lights when you go into a dark area. Replace any light bulbs as soon as they burn out.  Set up your furniture so you have a clear path. Avoid moving your furniture around.  If any of your floors are uneven, fix them.  If there are any pets around you, be aware of where they are.  Review your medicines with your doctor. Some medicines can make you feel dizzy. This can increase your chance of falling. Ask your doctor what other things that you can do to help prevent falls. This information is not intended to replace advice given to you by your health care provider. Make sure you discuss any questions you have with your health care provider. Document Released: 10/11/2009 Document Revised: 05/22/2016 Document Reviewed: 01/19/2015 Elsevier Interactive Patient Education  2017 [employer].

## 2018-10-08 NOTE — Progress Notes (Addendum)
Subjective:   Eugene White is a 72 y.o. male who presents for Medicare Annual/Subsequent preventive examination.  Review of Systems:  N/A  Cardiac Risk Factors include: advanced age (>70men, >52 women);dyslipidemia;hypertension;male gender;sedentary lifestyle  Pt presents today for Annual Wellness Visit.   S: Pt c/o elevated BP readings.  O: Appearance, Vitals, etc. Looks well no c/o chest pain, SOB, numbness, blurred vision. See elevated BP readings.  A: Review of systems  N/A  P: Orders from PCP : F/u @ CPE on 10/11/18. Avoid salt and caffeine intake. Eat heart healthy foods. Need to start exercising. Will discuss joint pains further at CPE. Check and record BP readings 2-3xs daily over the weekend. Will evaluate further at apt.    Objective:    Vitals: BP (!) 190/88 (BP Location: Right Arm)   Pulse 65   Temp 98.3 F (36.8 C) (Oral)   Ht 5\' 11"  (1.803 m)   Wt 221 lb 3.2 oz (100.3 kg)   BMI 30.85 kg/m   Body mass index is 30.85 kg/m.  Advanced Directives 10/08/2018 01/19/2018 01/18/2018 01/12/2018 11/23/2017 11/23/2017 11/17/2017  Does Patient Have a Medical Advance Directive? Yes Yes No Yes Yes Yes Yes  Type of Paramedic of [place];Living will [place];Living will - [place];Living will Living will;Healthcare Power of [place];Living will [place];Living will  Does patient want to make changes to medical advance directive? - No - Patient declined - - No - Patient declined No - Patient declined No - Patient declined  Copy of [place] in Chart? Yes No - copy requested No - copy requested Yes No - copy requested Yes Yes  Would patient like information on creating a medical advance directive? - - - - - - -    Tobacco Social History   Tobacco Use  Smoking Status Former Smoker  . Last attempt to quit: 12/29/1968  . Years since quitting: 49.8   Smokeless Tobacco Never Used     Counseling given: Not Answered   Clinical Intake:  Pre-visit preparation completed: Yes  Pain : No/denies pain Pain Score: 0-No pain     Nutritional Status: BMI > 30  Obese Nutritional Risks: None Diabetes: No  How often do you need to have someone help you when you read instructions, pamphlets, or other written materials from your doctor or pharmacy?: 1 - Never  Interpreter Needed?: No  Information entered by :: [facility], LPN  Past Medical History:  Diagnosis Date  . A-fib ([place])   . 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 Anemia    hx of one time  . Arthritis    OA  . Atrial fibrillation with controlled ventricular rate ([place]) 10/05/2017  . Benign essential HTN 05/31/2009  . Closed intertrochanteric fracture of hip, left, initial encounter ([place]) 10/10/2016  . Colon, diverticulosis 05/31/2009  . Difficulty sleeping   . Diverticulitis large intestine 05/31/2009  . Diverticulosis 2014  . Dysrhythmia    atrial fib   . Essential (primary) hypertension 05/31/2009  . 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 GERD (gastroesophageal reflux disease)   . GI symptom    had nausea / vomited once / frequent stools / getting better  . Hyperlipidemia   . Hypertension   . Hypothyroidism   . Mood changes   . Pneumonia    hx of  . Thyroid disease    hypo  Past Surgical History:  Procedure Laterality Date  . CARDIOVERSION N/A 01/12/2018   Procedure: CARDIOVERSION;  Surgeon: Dorothy Spark, MD;  Location: [facility] ENDOSCOPY;  Service: Cardiovascular;  Laterality: N/A;  . COLONOSCOPY  2014  . FEMUR IM NAIL Left 10/11/2016   Procedure: INTRAMEDULLARY (IM) NAIL FEMORAL;  Surgeon: [name], MD;  Location: [facility] ORS;  Service: Orthopedics;  Laterality: Left;  . HAND SURGERY   1973 / 2010 / 2015   right to release tendons  . HARDWARE REMOVAL Left 10/05/2017   Procedure: Removal of left femoral nail;  Surgeon: [name], MD;   Location: [facility] ORS;  Service: Orthopedics;  Laterality: Left;  90 mins  . HEMATOMA EVACUATION Left 01/19/2018   Procedure: Irrigation and debridement, Evacuation of left total knee hematoma;  Surgeon: [name], MD;  Location: [facility] ORS;  Service: Orthopedics;  Laterality: Left;  . KNEE ARTHROSCOPY  1984 & 2001   twice  . NASAL SEPTUM SURGERY    . removal Left femoral nail      10/05/17 Dr. [name]  . [place] & 2006   Right and left  . TEE WITH CARDIOVERSION  01/12/2018  . TEE WITHOUT CARDIOVERSION N/A 01/12/2018   Procedure: TRANSESOPHAGEAL ECHOCARDIOGRAM (TEE);  Surgeon: Dorothy Spark, MD;  Location: [facility] ENDOSCOPY;  Service: Cardiovascular;  Laterality: N/A;  . TONSILLECTOMY    . TOTAL KNEE ARTHROPLASTY Right 01/09/2015   Procedure: RIGHT TOTAL KNEE ARTHROPLASTY;  Surgeon: [name], MD;  Location: [facility] ORS;  Service: Orthopedics;  Laterality: Right;  . TOTAL KNEE ARTHROPLASTY Left 11/23/2017   Procedure: LEFT TOTAL KNEE ARTHROPLASTY;  Surgeon: [name], MD;  Location: [facility] ORS;  Service: Orthopedics;  Laterality: Left;  90 mins   Family History  Problem Relation Age of Onset  . Breast cancer Mother   . Colon cancer Neg Hx   . Esophageal cancer Neg Hx   . Stomach cancer Neg Hx   . Rectal cancer Neg Hx    Social History   Socioeconomic History  . Marital status: Married    Spouse name: Not on file  . Number of children: 2  . Years of education: Not on file  . Highest education level: Professional school degree (e.g., MD, DDS, DVM, JD)  Occupational History  . Occupation: retired, has degree in physiology  Social Needs  . Financial resource strain: Not hard at all  . Food insecurity:    Worry: Never true    Inability: Never true  . Transportation needs:    Medical: No    Non-medical: No  Tobacco Use  . Smoking status: Former Smoker    Last attempt to quit: 12/29/1968    Years since quitting: 49.8  . Smokeless tobacco: Never Used    Substance and Sexual Activity  . Alcohol use: Yes    Alcohol/week: 12.0 standard drinks    Types: 12 Cans of beer per week  . Drug use: No  . Sexual activity: Yes  Lifestyle  . Physical activity:    Days per week: Not on file    Minutes per session: Not on file  . Stress: Not at all  Relationships  . Social connections:    Talks on phone: Patient refused    Gets together: Patient refused    Attends religious service: Patient refused    Active member of club or organization: Patient refused    Attends meetings of clubs or organizations: Patient refused    Relationship  status: Patient refused  Other Topics Concern  . Not on file  Social History Narrative   Pt lives in [place] w/ wife.    Outpatient Encounter Medications as of 10/08/2018  Medication Sig  . atorvastatin (LIPITOR) 20 MG tablet TAKE 1 TABLET EVERY DAY  . Cholecalciferol (VITAMIN D) 2000 UNITS CAPS Take 2,000 Units by mouth daily at 12 noon.   Marland Kitchen levothyroxine (SYNTHROID, LEVOTHROID) 150 MCG tablet TAKE 1 TABLET EVERY DAY  . metoprolol tartrate (LOPRESSOR) 50 MG tablet TAKE 1 TABLET TWICE DAILY  . Multiple Vitamin (MULTIVITAMIN WITH MINERALS) TABS tablet Take 1 tablet by mouth daily at 12 noon.   . pantoprazole (PROTONIX) 40 MG tablet TAKE 1 TABLET EVERY DAY  . polycarbophil (FIBERCON) 625 MG tablet Take 1,250 mg by mouth daily at 12 noon.   . valsartan-hydrochlorothiazide (DIOVAN-HCT) 160-25 MG tablet TAKE 1 TABLET EVERY DAY  . venlafaxine XR (EFFEXOR-XR) 75 MG 24 hr capsule TAKE 1 CAPSULE EVERY DAY   No facility-administered encounter medications on file as of 10/08/2018.     Activities of Daily Living In your present state of health, do you have any difficulty performing the following activities: 10/08/2018 01/19/2018  Hearing? Y N  Comment Does not wear hearing aids.  -  Vision? N N  Difficulty concentrating or making decisions? N N  Walking or climbing stairs? Y Y  Comment Due to hip and knee pains.  -   Dressing or bathing? N N  Doing errands, shopping? N N  Preparing Food and eating ? N -  Using the Toilet? N -  In the past six months, have you accidently leaked urine? N -  Do you have problems with loss of bowel control? N -  Managing your Medications? N -  Managing your Finances? N -  Housekeeping or managing your Housekeeping? N -  Some recent data might be hidden    Patient Care Team: Chrismon, Vickki Muff, PA as PCP - General (Family Medicine) Dorothy Spark, MD as PCP - Cardiology (Cardiology) Dingeldein, Remo Lipps, MD as Consulting Physician (Ophthalmology) [name], MD as Consulting Physician (Orthopedic Surgery)   Assessment:   This is a routine wellness examination for Harjit.  Exercise Activities and Dietary recommendations Current Exercise Habits: The patient does not participate in regular exercise at present, Exercise limited by: orthopedic condition(s)  Goals    . Increase water intake     Recommend to continue drinking 6-8 glasses of water a day.        Fall Risk Fall Risk  10/08/2018 09/15/2017 10/06/2016  Falls in the past year? Yes Yes Yes  Number falls in past yr: 2 or more 2 or more 1  Injury with Fall? No Yes No  Comment - broken femur -  Risk Factor Category  High Fall Risk High Fall Risk -  Risk for fall due to : Impaired balance/gait;Impaired vision - -  Follow up - Falls prevention discussed -   FALL RISK PREVENTION PERTAINING TO THE HOME:  Any stairs in or around the home WITH handrails? Yes  Home free of loose throw rugs in walkways, pet beds, electrical cords, etc? Yes  Adequate lighting in your home to reduce risk of falls? Yes   ASSISTIVE DEVICES UTILIZED TO PREVENT FALLS:  Life alert? No  Use of a cane, walker or w/c? Yes  Grab bars in the bathroom? Yes  Shower chair or bench in shower? Yes  Elevated toilet seat or a handicapped toilet? Yes  DME ORDERS:  DME order needed?  No   TIMED UP AND GO:  Was the test performed?  No .    Depression Screen PHQ 2/9 Scores 10/08/2018 10/08/2018 09/15/2017 09/15/2017  PHQ - 2 Score 0 0 0 0  PHQ- 9 Score 0 - 0 -    Cognitive Function: Declined today.         Immunization History  Administered Date(s) Administered  . Influenza, High Dose Seasonal PF 11/01/2015, 09/15/2017  . Influenza-Unspecified 09/28/2013  . Pneumococcal Conjugate-13 09/15/2017  . Pneumococcal Polysaccharide-23 01/07/2013  . Zoster 01/07/2013    Qualifies for Shingles Vaccine? Yes  Zostavax completed 01/07/13. Due for Shingrix. Education has been provided regarding the importance of this vaccine. Pt has been advised to call insurance company to determine out of pocket expense. Advised may also receive vaccine at local pharmacy or Health Dept. Verbalized acceptance and understanding.  Tdap: Up to date  Flu Vaccine: Up to date  Pneumococcal Vaccine: Up to date   Screening Tests Health Maintenance  Topic Date Due  . Hepatitis C Screening  February 22, 1946  . TETANUS/TDAP  01/11/2023  . COLONOSCOPY  02/18/2023  . INFLUENZA VACCINE  Completed  . PNA vac Low Risk Adult  Completed   Cancer Screenings:  Colorectal Screening: Completed 02/18/13. Repeat every 10 years.  Lung Cancer Screening: (Low Dose CT Chest recommended if Age 67-80 years, 30 pack-year currently smoking OR have quit w/in 15years.) does not qualify.   Additional Screening:  Hepatitis C Screening: does qualify; however declines lab at today's visit.   Vision Screening: Recommended annual ophthalmology exams for early detection of glaucoma and other disorders of the eye. Dental Screening: Recommended annual dental exams for proper oral hygiene  Community Resource Referral:  CRR required this visit?  No         Plan:  I have personally reviewed and addressed the Medicare Annual Wellness questionnaire and have noted the following in the patient's chart:  A. Medical and social history B. Use of alcohol, tobacco or  illicit drugs  C. Current medications and supplements D. Functional ability and status E.  Nutritional status F.  Physical activity G. Advance directives H. List of other physicians I.  Hospitalizations, surgeries, and ER visits in previous 12 months J.  [place] such as hearing and vision if needed, cognitive and depression L. Referrals and appointments - none  In addition, I have reviewed and discussed with patient certain preventive protocols, quality metrics, and best practice recommendations. A written personalized care plan for preventive services as well as general preventive health recommendations were provided to patient.  See attached scanned questionnaire for additional information.   Signed,  Fabio Neighbors, LPN Nurse Health Advisor   Nurse Recommendations: Pt declined the Hep C lab today. Pt would like this added to BW orders at next OV on 10/11/18. F/u on elevated BP readings at CPE.   Reviewed documentation and recommendation notes of the Nurse Health Advisor. Was available for consultation and gave patient above advise. Agree with recommendations and plan.

## 2018-10-11 ENCOUNTER — Ambulatory Visit (INDEPENDENT_AMBULATORY_CARE_PROVIDER_SITE_OTHER): Payer: Medicare HMO | Admitting: Family Medicine

## 2018-10-11 ENCOUNTER — Other Ambulatory Visit: Payer: Self-pay

## 2018-10-11 ENCOUNTER — Encounter: Payer: Self-pay | Admitting: Family Medicine

## 2018-10-11 VITALS — BP 176/98 | HR 59 | Temp 97.8°F | Ht 71.0 in | Wt 220.4 lb

## 2018-10-11 DIAGNOSIS — K2100 Gastro-esophageal reflux disease with esophagitis, without bleeding: Secondary | ICD-10-CM

## 2018-10-11 DIAGNOSIS — E039 Hypothyroidism, unspecified: Secondary | ICD-10-CM | POA: Diagnosis not present

## 2018-10-11 DIAGNOSIS — E7800 Pure hypercholesterolemia, unspecified: Secondary | ICD-10-CM | POA: Diagnosis not present

## 2018-10-11 DIAGNOSIS — I10 Essential (primary) hypertension: Secondary | ICD-10-CM

## 2018-10-11 DIAGNOSIS — I4891 Unspecified atrial fibrillation: Secondary | ICD-10-CM

## 2018-10-11 DIAGNOSIS — M1990 Unspecified osteoarthritis, unspecified site: Secondary | ICD-10-CM

## 2018-10-11 NOTE — Progress Notes (Signed)
Patient: Eugene White, Male    DOB: 1946-06-27, 72 y.o.   MRN: [id number] Visit Date: 10/11/2018  Today's Provider: Vernie Murders, PA   Chief Complaint  Patient presents with  . Annual Exam   Subjective:     Complete Physical Eugene White is a 72 y.o. male. He feels well. He reports exercising as he can. He reports he is sleeping poorly.   BP Readings from Last 3 Encounters:  10/11/18 (!) 176/98  10/08/18 (!) 190/88  08/18/18 (!) 140/99   Wt Readings from Last 3 Encounters:  10/11/18 220 lb 6.4 oz (100 kg)  10/08/18 221 lb 3.2 oz (100.3 kg)  08/18/18 211 lb 9.6 oz (96 kg)     -----------------------------------------------------------   Review of Systems  Constitutional: Negative.   HENT: Negative.   Eyes: Negative.   Respiratory: Negative.   Cardiovascular: Negative.   Gastrointestinal: Negative.   Endocrine: Negative.   Genitourinary: Negative.   Musculoskeletal: Negative.   Skin: Negative.   Allergic/Immunologic: Negative.   Neurological: Negative.   Hematological: Negative.   Psychiatric/Behavioral: Negative.     Social History   Socioeconomic History  . Marital status: Married    Spouse name: Not on file  . Number of children: 2  . Years of education: Not on file  . Highest education level: Professional school degree (e.g., MD, DDS, DVM, JD)  Occupational History  . Occupation: retired, has degree in physiology  Social Needs  . Financial resource strain: Not hard at all  . Food insecurity:    Worry: Never true    Inability: Never true  . Transportation needs:    Medical: No    Non-medical: No  Tobacco Use  . Smoking status: Former Smoker    Last attempt to quit: 12/29/1968    Years since quitting: 49.8  . Smokeless tobacco: Never Used  Substance and Sexual Activity  . Alcohol use: Yes    Alcohol/week: 12.0 standard drinks    Types: 12 Cans of beer per week    Comment: weekly  . Drug use: No  . Sexual activity: Yes    Lifestyle  . Physical activity:    Days per week: Not on file    Minutes per session: Not on file  . Stress: Not at all  Relationships  . Social connections:    Talks on phone: Patient refused    Gets together: Patient refused    Attends religious service: Patient refused    Active member of club or organization: Patient refused    Attends meetings of clubs or organizations: Patient refused    Relationship status: Patient refused  . Intimate partner violence:    Fear of current or ex partner: Patient refused    Emotionally abused: Patient refused    Physically abused: Patient refused    Forced sexual activity: Patient refused  Other Topics Concern  . Not on file  Social History Narrative   Pt lives in [place] w/ wife.    Past Medical History:  Diagnosis Date  . A-fib ([place])   . 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 Anemia    hx of one time  . Arthritis    OA  . Atrial fibrillation with controlled ventricular rate ([place]) 10/05/2017  . Benign essential HTN 05/31/2009  . Closed intertrochanteric fracture of hip, left, initial encounter ([place]) 10/10/2016  . Colon, diverticulosis 05/31/2009  . Difficulty sleeping   . Diverticulitis large intestine 05/31/2009  . Diverticulosis 2014  .  Dysrhythmia    atrial fib   . Essential (primary) hypertension 05/31/2009  . 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 GERD (gastroesophageal reflux disease)   . GI symptom    had nausea / vomited once / frequent stools / getting better  . Hyperlipidemia   . Hypertension   . Hypothyroidism   . Mood changes   . Pneumonia    hx of  . Thyroid disease    hypo     Patient Active Problem List   Diagnosis Date Noted  . Overweight (BMI 25.0-29.9) 01/20/2018  . Traumatic hematoma of left knee 01/19/2018  . Elevated brain natriuretic peptide (BNP) level 01/11/2018  . Thyroid disease   . Pneumonia   . Mood changes   . Hypertension   . Hyperlipidemia   . GI symptom   . GERD  (gastroesophageal reflux disease)   . Facial nerve injury, birth trauma   . Difficulty sleeping   . Arthritis   . S/P left TKA 11/23/2017  . History of removal of retained hardware 10/06/2017  . Left femur IM nail removal 10/05/2017  . Atrial fibrillation with controlled ventricular rate ([place]) 10/05/2017  . Closed intertrochanteric fracture of hip, left, initial encounter ([place]) 10/10/2016  . Esophagitis, reflux 06/27/2015  . Contracture of palmar fascia (Dupuytren's) 06/27/2015  . Arthritis of knee 06/27/2015  . Arthropathia 06/27/2015  . Dupuytren's disease of palm 06/27/2015  . S/P right TKA 01/09/2015  . DJD (degenerative joint disease) of knee 01/09/2015  . Arthritis of knee, degenerative 01/09/2015  . History of artificial joint 01/09/2015  . Fever, unspecified 06/02/2014  . Loss of weight 06/02/2014  . Leukocytosis, unspecified 06/02/2014  . Abdominal pain, unspecified site 06/02/2014  . Anemia 06/02/2014  . Diverticulosis 12/29/2012  . Hypercholesterolemia without hypertriglyceridemia 05/31/2009  . 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 Benign essential HTN 05/31/2009  . Colon, diverticulosis 05/31/2009  . Clinical depression 05/31/2009  . Diverticulitis large intestine 05/31/2009  . Essential (primary) hypertension 05/31/2009  . Major depressive disorder with single episode 05/31/2009  . Pure hypercholesterolemia 05/31/2009    Past Surgical History:  Procedure Laterality Date  . CARDIOVERSION N/A 01/12/2018   Procedure: CARDIOVERSION;  Surgeon: Dorothy Spark, MD;  Location: [facility] ENDOSCOPY;  Service: Cardiovascular;  Laterality: N/A;  . COLONOSCOPY  2014  . FEMUR IM NAIL Left 10/11/2016   Procedure: INTRAMEDULLARY (IM) NAIL FEMORAL;  Surgeon: [name], MD;  Location: [facility] ORS;  Service: Orthopedics;  Laterality: Left;  . HAND SURGERY   1973 / 2010 / 2015   right to release tendons  . HARDWARE REMOVAL Left 10/05/2017   Procedure: Removal of left femoral nail;   Surgeon: [name], MD;  Location: [facility] ORS;  Service: Orthopedics;  Laterality: Left;  90 mins  . HEMATOMA EVACUATION Left 01/19/2018   Procedure: Irrigation and debridement, Evacuation of left total knee hematoma;  Surgeon: [name], MD;  Location: [facility] ORS;  Service: Orthopedics;  Laterality: Left;  . KNEE ARTHROSCOPY  1984 & 2001   twice  . NASAL SEPTUM SURGERY    . removal Left femoral nail      10/05/17 Dr. [name]  . [place] & 2006   Right and left  . TEE WITH CARDIOVERSION  01/12/2018  . TEE WITHOUT CARDIOVERSION N/A 01/12/2018   Procedure: TRANSESOPHAGEAL ECHOCARDIOGRAM (TEE);  Surgeon: Dorothy Spark, MD;  Location: [place];  Service: Cardiovascular;  Laterality: N/A;  .  TONSILLECTOMY    . TOTAL KNEE ARTHROPLASTY Right 01/09/2015   Procedure: RIGHT TOTAL KNEE ARTHROPLASTY;  Surgeon: [name], MD;  Location: [facility] ORS;  Service: Orthopedics;  Laterality: Right;  . TOTAL KNEE ARTHROPLASTY Left 11/23/2017   Procedure: LEFT TOTAL KNEE ARTHROPLASTY;  Surgeon: [name], MD;  Location: [facility] ORS;  Service: Orthopedics;  Laterality: Left;  90 mins    His family history includes Breast cancer in his mother. There is no history of Colon cancer, Esophageal cancer, Stomach cancer, or Rectal cancer.      Current Outpatient Medications:  .  atorvastatin (LIPITOR) 20 MG tablet, TAKE 1 TABLET EVERY DAY, Disp: 90 tablet, Rfl: 4 .  Cholecalciferol (VITAMIN D) 2000 UNITS CAPS, Take 2,000 Units by mouth daily at 12 noon. , Disp: , Rfl:  .  levothyroxine (SYNTHROID, LEVOTHROID) 150 MCG tablet, TAKE 1 TABLET EVERY DAY, Disp: 90 tablet, Rfl: 4 .  metoprolol tartrate (LOPRESSOR) 50 MG tablet, TAKE 1 TABLET TWICE DAILY, Disp: 180 tablet, Rfl: 2 .  Multiple Vitamin (MULTIVITAMIN WITH MINERALS) TABS tablet, Take 1 tablet by mouth daily at 12 noon. , Disp: , Rfl:  .  Multiple Vitamins-Minerals (PRESERVISION AREDS 2) CAPS, Take by mouth., Disp: , Rfl:  .   pantoprazole (PROTONIX) 40 MG tablet, TAKE 1 TABLET EVERY DAY, Disp: 90 tablet, Rfl: 4 .  polycarbophil (FIBERCON) 625 MG tablet, Take 1,250 mg by mouth daily at 12 noon. , Disp: , Rfl:  .  valsartan-hydrochlorothiazide (DIOVAN-HCT) 160-25 MG tablet, TAKE 1 TABLET EVERY DAY, Disp: 90 tablet, Rfl: 3 .  venlafaxine XR (EFFEXOR-XR) 75 MG 24 hr capsule, TAKE 1 CAPSULE EVERY DAY, Disp: 90 capsule, Rfl: 3  Patient Care Team: Chrismon, Vickki Muff, PA as PCP - General (Family Medicine) Dorothy Spark, MD as PCP - Cardiology (Cardiology) Dingeldein, Remo Lipps, MD as Consulting Physician (Ophthalmology) [name], MD as Consulting Physician (Orthopedic Surgery)     Objective:   Vitals: BP (!) 176/98 (BP Location: Right Arm, Patient Position: Sitting, Cuff Size: Normal)   Pulse (!) 59   Temp 97.8 F (36.6 C) (Oral)   Ht 5\' 11"  (1.803 m)   Wt 220 lb 6.4 oz (100 kg)   SpO2 97%   BMI 30.74 kg/m   Physical Exam  Constitutional: He is oriented to person, place, and time. He appears well-developed and well-nourished.  HENT:  Head: Normocephalic and atraumatic.  Right Ear: External ear normal.  Left Ear: External ear normal.  Nose: Nose normal.  Mouth/Throat: Oropharynx is clear and moist.  Eyes: Pupils are equal, round, and reactive to light. Conjunctivae and EOM are normal. Right eye exhibits no discharge.  Neck: Normal range of motion. Neck supple. No tracheal deviation present. No thyromegaly present.  Cardiovascular: Normal rate, regular rhythm, normal heart sounds and intact distal pulses.  No murmur heard. Pulmonary/Chest: Effort normal and breath sounds normal. No respiratory distress. He has no wheezes. He has no rales. He exhibits no tenderness.  Abdominal: Soft. He exhibits no distension and no mass. There is no tenderness. There is no rebound and no guarding.  Musculoskeletal: Normal range of motion. He exhibits no edema or tenderness.  Well healed scars from left total knee  replacement 11-23-17 and right total knee replacement 01-09-15. Left knee with approximate 10-15 degree loss of flexion.  Lymphadenopathy:    He has no cervical adenopathy.  Neurological: He is alert and oriented to person, place, and time. He has normal reflexes. He displays normal reflexes. No cranial  nerve deficit. He exhibits normal muscle tone. Coordination normal.  Ptosis of right eyelids with history of attempted surgical correction. Right facial weakness congenital. Poor balance with history of left hip surgical fixation of fractured femur 10-11-16 and removal of femoral nail 10-05-17.  Skin: Skin is warm and dry. No rash noted. No erythema.  Psychiatric: He has a normal mood and affect. His behavior is normal. Judgment and thought content normal.    Activities of Daily Living In your present state of health, do you have any difficulty performing the following activities: 10/08/2018 01/19/2018  Hearing? Y N  Comment Does not wear hearing aids.  -  Vision? N N  Difficulty concentrating or making decisions? N N  Walking or climbing stairs? Y Y  Comment Due to hip and knee pains.  -  Dressing or bathing? N N  Doing errands, shopping? N N  Preparing Food and eating ? N -  Using the Toilet? N -  In the past six months, have you accidently leaked urine? N -  Do you have problems with loss of bowel control? N -  Managing your Medications? N -  Managing your Finances? N -  Housekeeping or managing your Housekeeping? N -  Some recent data might be hidden    Fall Risk Assessment Fall Risk  10/11/2018 10/08/2018 09/15/2017 10/06/2016  Falls in the past year? Yes Yes Yes Yes  Number falls in past yr: 2 or more 2 or more 2 or more 1  Injury with Fall? No No Yes No  Comment - - broken femur -  Risk Factor Category  - High Fall Risk High Fall Risk -  Risk for fall due to : - Impaired balance/gait;Impaired vision - -  Follow up - - Falls prevention discussed -     Depression Screen PHQ  2/9 Scores 10/11/2018 10/08/2018 10/08/2018 09/15/2017  PHQ - 2 Score 0 0 0 0  PHQ- 9 Score - 0 - 0    Cognitive Testing - 6-CIT  Correct? Score   What year is it? yes 4 0 or 4  What month is it? yes 3 0 or 3  Memorize:    Pia Mau,  41,  [address],  [place],      What time is it? (within 1 hour) yes 3 0 or 3  Count backwards from 20 yes 2 0, 2, or 4  Name the months of the year yes 2 0, 2, or 4  Repeat name & address above yes 2 0, 2, 4, 6, 8, or 10       TOTAL SCORE  16/28   Interpretation:  Normal  Normal (0-7) Abnormal (8-28)       Assessment & Plan:    Annual Physical Reviewed patient's Family Medical History Reviewed and updated list of patient's medical providers Assessment of cognitive impairment was done Assessed patient's functional ability Established a written schedule for health screening [place] Completed and Reviewed  Exercise Activities and Dietary recommendations Goals    . Increase water intake     Recommend to continue drinking 6-8 glasses of water a day.        Immunization History  Administered Date(s) Administered  . Influenza, High Dose Seasonal PF 11/01/2015, 09/15/2017  . Influenza-Unspecified 09/28/2013  . Pneumococcal Conjugate-13 09/15/2017  . Pneumococcal Polysaccharide-23 01/07/2013  . Zoster 01/07/2013    Health Maintenance  Topic Date Due  . Hepatitis C Screening  1946-12-18  . TETANUS/TDAP  01/11/2023  .  COLONOSCOPY  02/18/2023  . INFLUENZA VACCINE  Completed  . PNA vac Low Risk Adult  Completed     Discussed health benefits of physical activity, and encouraged him to engage in regular exercise appropriate for his age and condition.    ------------------------------------------------------------------------------------------------------------ 1. Benign essential HTN BP high today. Still taking Metoprolol Tartrate 50 mg BID and Diovan-HCT 160-25 mg qd. Has not been on the Amlodipine since A. Fib  diagnosis. Recommend he strictly follow low fat, low salt diet and exercise as arthritis allows. Decrease caffeine and beer/alcohol intake. Recheck CBC, CMP and TSH. Check BP at home and follow up pending reports. May need to restart Amlodipine 5 mg qd. General physical exam stable. Had flu vaccination [facility] last week.  - CBC with Differential/Platelet - Comprehensive metabolic panel - TSH  2. Atrial fibrillation status post cardioversion [facility]) Successful cardioversion on 01-22-18 without recurrence. Still taking Metoprolol as above and allowed to stop the Eliquis. Recheck labs. Proceed with cardiology follow up. - CBC with Differential/Platelet - Comprehensive metabolic panel - TSH - T4 - Lipid panel  3. Adult hypothyroidism Still taking Levothyroxine 150 mcg qd. No tremor or exophthalmos. No myxedema, heat intolerance, brittle nails or hair loss. Recheck labs to assess need for dosage adjustments. - CBC with Differential/Platelet - Comprehensive metabolic panel - TSH - T4  4. Arthritis History of left hip/femoral nailing 10-11-16 with subsequent nail removal 10-05-17 and bilateral total knee arthroplasty (right 01-09-15 and left 11-23-17). Been off the Mobic since onset of A. Fib during left femoral nail removal and starting Eliquis. Cardiologist allowed him to stop the Eliquis at follow up post A.Fib cardioversion on 08-18-18. Will check labs and consider use of Mobic again for joint pains. - CBC with Differential/Platelet  5. Esophagitis, reflux Well controlled dyspepsia with use of Protonix 40 mg qd. No melena, hematemesis or hematochezia. Get follow up labs. - CBC with Differential/Platelet  6. Hypercholesterolemia without hypertriglyceridemia Tolerating Atorvastatin 20 mg qd. Need to get more strict with low fat diet and exercise. Will recheck labs and follow up pending reports. - Comprehensive metabolic panel - TSH - Lipid panel    Vernie Murders, PA  [facility]

## 2018-10-12 LAB — LIPID PANEL
CHOL/HDL RATIO: 3.2 ratio (ref 0.0–5.0)
CHOLESTEROL TOTAL: 201 mg/dL — AB (ref 100–199)
HDL: 63 mg/dL (ref >39)
LDL CALC: 107 mg/dL — AB (ref 0–99)
TRIGLYCERIDES: 153 mg/dL — AB (ref 0–149)
VLDL Cholesterol Cal: 31 mg/dL (ref 5–40)

## 2018-10-12 LAB — COMPREHENSIVE METABOLIC PANEL
ALT: 60 IU/L — AB (ref 0–44)
AST: 48 IU/L — AB (ref 0–40)
Albumin/Globulin Ratio: 2.2 (ref 1.2–2.2)
Albumin: 4.6 g/dL (ref 3.5–4.8)
Alkaline Phosphatase: 90 IU/L (ref 39–117)
BILIRUBIN TOTAL: 0.7 mg/dL (ref 0.0–1.2)
BUN/Creatinine Ratio: 18 (ref 10–24)
BUN: 13 mg/dL (ref 8–27)
CALCIUM: 9.4 mg/dL (ref 8.6–10.2)
CHLORIDE: 96 mmol/L (ref 96–106)
CO2: 25 mmol/L (ref 20–29)
Creatinine, Ser: 0.73 mg/dL — ABNORMAL LOW (ref 0.76–1.27)
GFR calc Af Amer: 106 mL/min/{1.73_m2} (ref >59)
GFR calc non Af Amer: 92 mL/min/{1.73_m2} (ref >59)
GLUCOSE: 103 mg/dL — AB (ref 65–99)
Globulin, Total: 2.1 g/dL (ref 1.5–4.5)
POTASSIUM: 4.1 mmol/L (ref 3.5–5.2)
Sodium: 137 mmol/L (ref 134–144)
Total Protein: 6.7 g/dL (ref 6.0–8.5)

## 2018-10-12 LAB — CBC WITH DIFFERENTIAL/PLATELET
BASOS ABS: 0 10*3/uL (ref 0.0–0.2)
Basos: 1 %
EOS (ABSOLUTE): 0.2 10*3/uL (ref 0.0–0.4)
Eos: 3 %
Hematocrit: 41.7 % (ref 37.5–51.0)
Hemoglobin: 14.7 g/dL (ref 13.0–17.7)
IMMATURE GRANS (ABS): 0 10*3/uL (ref 0.0–0.1)
IMMATURE GRANULOCYTES: 0 %
LYMPHS: 28 %
Lymphocytes Absolute: 1.8 10*3/uL (ref 0.7–3.1)
MCH: 33.9 pg — ABNORMAL HIGH (ref 26.6–33.0)
MCHC: 35.3 g/dL (ref 31.5–35.7)
MCV: 96 fL (ref 79–97)
MONOS ABS: 0.6 10*3/uL (ref 0.1–0.9)
Monocytes: 10 %
NEUTROS PCT: 58 %
Neutrophils Absolute: 3.6 10*3/uL (ref 1.4–7.0)
PLATELETS: 217 10*3/uL (ref 150–450)
RBC: 4.33 x10E6/uL (ref 4.14–5.80)
RDW: 12.3 % (ref 12.3–15.4)
WBC: 6.3 10*3/uL (ref 3.4–10.8)

## 2018-10-12 LAB — TSH: TSH: 2.11 u[IU]/mL (ref 0.450–4.500)

## 2018-10-12 LAB — T4: T4 TOTAL: 4.9 ug/dL (ref 4.5–12.0)

## 2018-11-12 ENCOUNTER — Encounter: Payer: Self-pay | Admitting: Family Medicine

## 2018-11-12 ENCOUNTER — Ambulatory Visit (INDEPENDENT_AMBULATORY_CARE_PROVIDER_SITE_OTHER): Payer: Medicare HMO | Admitting: Family Medicine

## 2018-11-12 ENCOUNTER — Other Ambulatory Visit: Payer: Self-pay

## 2018-11-12 VITALS — BP 170/100 | HR 58 | Temp 97.8°F | Ht 71.0 in | Wt 214.4 lb

## 2018-11-12 DIAGNOSIS — I4891 Unspecified atrial fibrillation: Secondary | ICD-10-CM

## 2018-11-12 DIAGNOSIS — E785 Hyperlipidemia, unspecified: Secondary | ICD-10-CM

## 2018-11-12 DIAGNOSIS — I10 Essential (primary) hypertension: Secondary | ICD-10-CM

## 2018-11-12 NOTE — Progress Notes (Signed)
Patient: Eugene White Male    DOB: [birth year]   72 y.o.   MRN: [id number] Visit Date: 11/12/2018  Today's Provider: Vernie Murders, PA   Chief Complaint  Patient presents with  . Follow-up    blood pressure and labs   Subjective:    HPI  Pt reports he is being seen for follow up on blood pressure and to review labs. Had not restarted the Amlodipine 5 mg qd until this week (has been given the OK by his cardiologist). Still taking the Diovan-HCT 160-25 mg qd with Metoprolol Tartrate 50 mg BID. No peripheral edema, palpitations, chest pains, dizziness or dyspnea now. Labs showed normal renal function with slight elevations of AST and ALT.  Recent Results (from the past 2160 hour(s))  CBC with Differential/Platelet     Status: Abnormal   Collection Time: 10/11/18 12:07 PM  Result Value Ref Range   WBC 6.3 3.4 - 10.8 x10E3/uL   RBC 4.33 4.14 - 5.80 x10E6/uL   Hemoglobin 14.7 13.0 - 17.7 g/dL   Hematocrit 41.7 37.5 - 51.0 %   MCV 96 79 - 97 fL   MCH 33.9 (H) 26.6 - 33.0 pg   MCHC 35.3 31.5 - 35.7 g/dL   RDW 12.3 12.3 - 15.4 %   Platelets 217 150 - 450 x10E3/uL   Neutrophils 58 Not Estab. %   Lymphs 28 Not Estab. %   Monocytes 10 Not Estab. %   Eos 3 Not Estab. %   Basos 1 Not Estab. %   Neutrophils Absolute 3.6 1.4 - 7.0 x10E3/uL   Lymphocytes Absolute 1.8 0.7 - 3.1 x10E3/uL   Monocytes Absolute 0.6 0.1 - 0.9 x10E3/uL   EOS (ABSOLUTE) 0.2 0.0 - 0.4 x10E3/uL   Basophils Absolute 0.0 0.0 - 0.2 x10E3/uL   Immature Granulocytes 0 Not Estab. %   Immature Grans (Abs) 0.0 0.0 - 0.1 x10E3/uL  Comprehensive metabolic panel     Status: Abnormal   Collection Time: 10/11/18 12:07 PM  Result Value Ref Range   Glucose 103 (H) 65 - 99 mg/dL   BUN 13 8 - 27 mg/dL   Creatinine, Ser 0.73 (L) 0.76 - 1.27 mg/dL   GFR calc non Af Amer 92 >59 mL/min/1.73   GFR calc Af Amer 106 >59 mL/min/1.73   BUN/Creatinine Ratio 18 10 - 24   Sodium 137 134 - 144 mmol/L   Potassium 4.1 3.5 - 5.2  mmol/L   Chloride 96 96 - 106 mmol/L   CO2 25 20 - 29 mmol/L   Calcium 9.4 8.6 - 10.2 mg/dL   Total Protein 6.7 6.0 - 8.5 g/dL   Albumin 4.6 3.5 - 4.8 g/dL   Globulin, Total 2.1 1.5 - 4.5 g/dL   Albumin/Globulin Ratio 2.2 1.2 - 2.2   Bilirubin Total 0.7 0.0 - 1.2 mg/dL   Alkaline Phosphatase 90 39 - 117 IU/L   AST 48 (H) 0 - 40 IU/L   ALT 60 (H) 0 - 44 IU/L  TSH     Status: None   Collection Time: 10/11/18 12:07 PM  Result Value Ref Range   TSH 2.110 0.450 - 4.500 uIU/mL  T4     Status: None   Collection Time: 10/11/18 12:07 PM  Result Value Ref Range   T4, Total 4.9 4.5 - 12.0 ug/dL  Lipid panel     Status: Abnormal   Collection Time: 10/11/18 12:07 PM  Result Value Ref Range   Cholesterol, Total 201 (H)  100 - 199 mg/dL   Triglycerides 153 (H) 0 - 149 mg/dL   HDL 63 >39 mg/dL   VLDL Cholesterol Cal 31 5 - 40 mg/dL   LDL Calculated 107 (H) 0 - 99 mg/dL   Chol/HDL Ratio 3.2 0.0 - 5.0 ratio    Comment:                                   T. Chol/HDL Ratio                                             Men  Women                               1/2 Avg.Risk  3.4    3.3                                   Avg.Risk  5.0    4.4                                2X Avg.Risk  9.6    7.1                                3X Avg.Risk 23.4   11.0       Past Medical History:  Diagnosis Date  . A-fib ([place])   . 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 Anemia    hx of one time  . Arthritis    OA  . Atrial fibrillation with controlled ventricular rate ([place]) 10/05/2017  . Benign essential HTN 05/31/2009  . Closed intertrochanteric fracture of hip, left, initial encounter ([place]) 10/10/2016  . Colon, diverticulosis 05/31/2009  . Difficulty sleeping   . Diverticulitis large intestine 05/31/2009  . Diverticulosis 2014  . Dysrhythmia    atrial fib   . Essential (primary) hypertension 05/31/2009  . 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 GERD (gastroesophageal reflux disease)   . GI  symptom    had nausea / vomited once / frequent stools / getting better  . Hyperlipidemia   . Hypertension   . Hypothyroidism   . Mood changes   . Pneumonia    hx of  . Thyroid disease    hypo   Past Surgical History:  Procedure Laterality Date  . CARDIOVERSION N/A 01/12/2018   Procedure: CARDIOVERSION;  Surgeon: Dorothy Spark, MD;  Location: [facility] ENDOSCOPY;  Service: Cardiovascular;  Laterality: N/A;  . COLONOSCOPY  2014  . FEMUR IM NAIL Left 10/11/2016   Procedure: INTRAMEDULLARY (IM) NAIL FEMORAL;  Surgeon: [name], MD;  Location: [facility] ORS;  Service: Orthopedics;  Laterality: Left;  . HAND SURGERY   1973 / 2010 / 2015   right to release tendons  . HARDWARE REMOVAL Left 10/05/2017   Procedure: Removal of left femoral nail;  Surgeon: [name], MD;  Location: [facility] ORS;  Service: Orthopedics;  Laterality: Left;  90 mins  . HEMATOMA EVACUATION Left 01/19/2018   Procedure: Irrigation and debridement, Evacuation of left  total knee hematoma;  Surgeon: [name], MD;  Location: [facility] ORS;  Service: Orthopedics;  Laterality: Left;  . KNEE ARTHROSCOPY  1984 & 2001   twice  . NASAL SEPTUM SURGERY    . removal Left femoral nail      10/05/17 Dr. [name]  . [place] & 2006   Right and left  . TEE WITH CARDIOVERSION  01/12/2018  . TEE WITHOUT CARDIOVERSION N/A 01/12/2018   Procedure: TRANSESOPHAGEAL ECHOCARDIOGRAM (TEE);  Surgeon: Dorothy Spark, MD;  Location: [facility] ENDOSCOPY;  Service: Cardiovascular;  Laterality: N/A;  . TONSILLECTOMY    . TOTAL KNEE ARTHROPLASTY Right 01/09/2015   Procedure: RIGHT TOTAL KNEE ARTHROPLASTY;  Surgeon: [name], MD;  Location: [facility] ORS;  Service: Orthopedics;  Laterality: Right;  . TOTAL KNEE ARTHROPLASTY Left 11/23/2017   Procedure: LEFT TOTAL KNEE ARTHROPLASTY;  Surgeon: [name], MD;  Location: [facility] ORS;  Service: Orthopedics;  Laterality: Left;  90 mins   Family History  Problem Relation Age of Onset  .  Breast cancer Mother   . Colon cancer Neg Hx   . Esophageal cancer Neg Hx   . Stomach cancer Neg Hx   . Rectal cancer Neg Hx    Allergies  Allergen Reactions  . Sulfa Antibiotics Other (See Comments)    Unknown/childhood allergy    Current Outpatient Medications:  .  amLODipine (NORVASC) 5 MG tablet, Take 5 mg by mouth daily., Disp: , Rfl:  .  atorvastatin (LIPITOR) 20 MG tablet, TAKE 1 TABLET EVERY DAY, Disp: 90 tablet, Rfl: 4 .  Cholecalciferol (VITAMIN D) 2000 UNITS CAPS, Take 2,000 Units by mouth daily at 12 noon. , Disp: , Rfl:  .  levothyroxine (SYNTHROID, LEVOTHROID) 150 MCG tablet, TAKE 1 TABLET EVERY DAY, Disp: 90 tablet, Rfl: 4 .  metoprolol tartrate (LOPRESSOR) 50 MG tablet, TAKE 1 TABLET TWICE DAILY, Disp: 180 tablet, Rfl: 2 .  Multiple Vitamin (MULTIVITAMIN WITH MINERALS) TABS tablet, Take 1 tablet by mouth daily at 12 noon. , Disp: , Rfl:  .  Multiple Vitamins-Minerals (PRESERVISION AREDS 2) CAPS, Take by mouth., Disp: , Rfl:  .  pantoprazole (PROTONIX) 40 MG tablet, TAKE 1 TABLET EVERY DAY, Disp: 90 tablet, Rfl: 4 .  polycarbophil (FIBERCON) 625 MG tablet, Take 1,250 mg by mouth daily at 12 noon. , Disp: , Rfl:  .  valsartan-hydrochlorothiazide (DIOVAN-HCT) 160-25 MG tablet, TAKE 1 TABLET EVERY DAY, Disp: 90 tablet, Rfl: 3 .  venlafaxine XR (EFFEXOR-XR) 75 MG 24 hr capsule, TAKE 1 CAPSULE EVERY DAY, Disp: 90 capsule, Rfl: 3  Review of Systems  Constitutional: Negative.   HENT: Negative.   Eyes: Negative.   Respiratory: Negative.   Cardiovascular: Negative.   Gastrointestinal: Negative.   Endocrine: Negative.   Genitourinary: Negative.   Musculoskeletal: Negative.   Skin: Negative.   Allergic/Immunologic: Negative.   Neurological: Negative.   Hematological: Negative.   Psychiatric/Behavioral: Negative.    Social History   Tobacco Use  . Smoking status: Former Smoker    Last attempt to quit: 12/29/1968    Years since quitting: 49.9  . Smokeless tobacco: Never  Used  Substance Use Topics  . Alcohol use: Yes    Alcohol/week: 12.0 standard drinks    Types: 12 Cans of beer per week    Comment: weekly   Objective:   BP (!) 170/100 (BP Location: Right Arm, Patient Position: Sitting, Cuff Size: Normal)   Pulse (!) 58   Temp 97.8 F (36.6  C) (Oral)   Ht 5\' 11"  (1.803 m)   Wt 214 lb 6.4 oz (97.3 kg)   SpO2 98%   BMI 29.90 kg/m   Wt Readings from Last 3 Encounters:  11/12/18 214 lb 6.4 oz (97.3 kg)  10/11/18 220 lb 6.4 oz (100 kg)  10/08/18 221 lb 3.2 oz (100.3 kg)   BP Readings from Last 3 Encounters:  11/12/18 (!) 170/100  10/11/18 (!) 176/98  10/08/18 (!) 190/88   Vitals:   11/12/18 1006  BP: (!) 170/100  Pulse: (!) 58  Temp: 97.8 F (36.6 C)  TempSrc: Oral  SpO2: 98%  Weight: 214 lb 6.4 oz (97.3 kg)  Height: 5\' 11"  (1.803 m)   Physical Exam  Constitutional: He is oriented to person, place, and time. He appears well-developed and well-nourished. No distress.  HENT:  Head: Normocephalic and atraumatic.  Right Ear: Hearing normal.  Left Ear: Hearing normal.  Nose: Nose normal.  Eyes: Conjunctivae and lids are normal. Right eye exhibits no discharge. Left eye exhibits no discharge. No scleral icterus.  Cardiovascular: Normal rate and regular rhythm.  Pulmonary/Chest: Effort normal. No respiratory distress.  Musculoskeletal: Normal range of motion.  Neurological: He is alert and oriented to person, place, and time.  Skin: Skin is intact. No lesion and no rash noted.  Psychiatric: He has a normal mood and affect. His speech is normal and behavior is normal. Thought content normal.      Assessment & Plan:     1. Essential hypertension Labs essentially in good shape. Did not add the Amlodipine 5 mg qd to the Diovan-HCT 160-25 mg qd and Metoprolol Tartrate 50 mg BID until this week. BP still spiking to 170/100 today. No chest pains, palpitations, edema or dyspnea. Feels this is elevated some with arthritis discomfort. Encouraged  to use the Amlodipine regularly and call report of home BP readings in a week to see if dosage needs to be adjusted.  2. Atrial fibrillation with controlled ventricular rate (HCC) Well controlled with Metoprolol Tartrate 50 mg BID. No irregularity today. Continue cardiology follow up as planned with Dr. [name]. Had successful cardioversion on 01-22-18.  3. Hyperlipidemia, unspecified hyperlipidemia type Still taking the Atorvastatin 20 mg qd and has lost 6 lbs in the past month. Lipid panel showed great improvement in the triglycerides (471 down to 153) and LDL near goal (was 107) a month ago. Encouraged to focus on a low fat diet and limit ETOH intake. Also, needs to exercise again as his arthritis will allow. Recheck levels in 3 months.       Vernie Murders, PA  [facility]

## 2018-11-17 DIAGNOSIS — M25562 Pain in left knee: Secondary | ICD-10-CM | POA: Diagnosis not present

## 2018-12-13 ENCOUNTER — Ambulatory Visit
Admission: RE | Admit: 2018-12-13 | Discharge: 2018-12-13 | Disposition: A | Discharge status: Home / Self Care | Payer: Medicare HMO | Source: Ambulatory Visit | Attending: Family Medicine | Admitting: Family Medicine

## 2018-12-13 ENCOUNTER — Telehealth: Payer: Self-pay | Admitting: Cardiology

## 2018-12-13 ENCOUNTER — Ambulatory Visit (INDEPENDENT_AMBULATORY_CARE_PROVIDER_SITE_OTHER): Payer: Medicare HMO | Admitting: Family Medicine

## 2018-12-13 ENCOUNTER — Encounter: Payer: Self-pay | Admitting: Family Medicine

## 2018-12-13 VITALS — BP 140/80 | HR 82 | Temp 98.5°F | Resp 16 | Wt 219.0 lb

## 2018-12-13 DIAGNOSIS — I4891 Unspecified atrial fibrillation: Secondary | ICD-10-CM

## 2018-12-13 DIAGNOSIS — R5383 Other fatigue: Secondary | ICD-10-CM | POA: Insufficient documentation

## 2018-12-13 DIAGNOSIS — R5382 Chronic fatigue, unspecified: Secondary | ICD-10-CM | POA: Diagnosis not present

## 2018-12-13 DIAGNOSIS — R05 Cough: Secondary | ICD-10-CM | POA: Diagnosis not present

## 2018-12-13 NOTE — Telephone Encounter (Signed)
Pt and wife is calling in as instructed by his PCP today.  Pt states he is very much in a stable condition, but needs to see Dr [name] or an APP tomorrow.  Pt states that he went to see his PCP today for complaints of feeling very "wipped out," not feeling like himself for the past couple of weeks, and has had elevated BP readings  Pt states that his wife is an Therapist, sports, and she has been monitoring his BP and HR at home.  Last reading was 140/80 and HR-90 100 bpm irregular.  Pt states he gets mildly SOB at times, and feels light headed at times.  Pt states his wife made him to go his PCP for this today, for further evaluation of complaints.  Pt states his PCP did an EKG on him (able to be viewed in Epic), and noted he was back in afib at a rate of 65 bpm , with T-wave abnormality.  Pt does have a history of afib.  Pt is states he is compliant with taking all meds prescribed. Pt has no complaints of CP, palpitations, DOE, pre-syncopal or syncopal episodes.  Scheduled the pt to see Estella Husk PA-C for tomorrow 12/17 at 1:30 pm.  Pt aware to arrive 15 mins prior to this appt.  Advised the pt that between now and then, if his symptoms worsen, he should refer to the ER for further evaluation.  Informed the pt that I will make Dr [name] aware of this message, and aware that he is scheduled to see Estella Husk PA-C for tomorrow.  Pt verbalized understanding and agrees with this plan.

## 2018-12-13 NOTE — Telephone Encounter (Signed)
New Message    Patient c/o Palpitations:  High priority if patient c/o lightheadedness, shortness of breath, or chest pain  1) How long have you had palpitations/irregular HR/ Afib? Are you having the symptoms now? Unsure, but the PCP confirmed that he is AFIB today  2) Are you currently experiencing lightheadedness, SOB or CP? Shortness of breath and some lightheadness   3) Do you have a history of afib (atrial fibrillation) or irregular heart rhythm? yes  4) Have you checked your BP or HR? (document readings if available): BP 140/80 HR between 90 and 100  5) Are you experiencing any other symptoms? None  PCP office confirmed he was in afib today

## 2018-12-13 NOTE — Progress Notes (Signed)
Patient: Eugene White Male    DOB: [birth year]   72 y.o.   MRN: [id number] Visit Date: 12/13/2018  Today's Provider: Vernie Murders, PA   Chief Complaint  Patient presents with  . Fatigue   Subjective:     HPI   Atrial fibrillation with controlled ventricular rate ([place]) From 11/12/2018-Well controlled with Metoprolol Tartrate 50 mg BID. No irregularity today. Continue cardiology follow up as planned with Dr. [name].   Past Medical History:  Diagnosis Date  . A-fib ([place])   . 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 Anemia    hx of one time  . Arthritis    OA  . Atrial fibrillation with controlled ventricular rate ([place]) 10/05/2017  . Benign essential HTN 05/31/2009  . Closed intertrochanteric fracture of hip, left, initial encounter ([place]) 10/10/2016  . Colon, diverticulosis 05/31/2009  . Difficulty sleeping   . Diverticulitis large intestine 05/31/2009  . Diverticulosis 2014  . Dysrhythmia    atrial fib   . Essential (primary) hypertension 05/31/2009  . 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 GERD (gastroesophageal reflux disease)   . GI symptom    had nausea / vomited once / frequent stools / getting better  . Hyperlipidemia   . Hypertension   . Hypothyroidism   . Mood changes   . Pneumonia    hx of  . Thyroid disease    hypo   Past Surgical History:  Procedure Laterality Date  . CARDIOVERSION N/A 01/12/2018   Procedure: CARDIOVERSION;  Surgeon: Dorothy Spark, MD;  Location: [facility] ENDOSCOPY;  Service: Cardiovascular;  Laterality: N/A;  . COLONOSCOPY  2014  . FEMUR IM NAIL Left 10/11/2016   Procedure: INTRAMEDULLARY (IM) NAIL FEMORAL;  Surgeon: [name], MD;  Location: [facility] ORS;  Service: Orthopedics;  Laterality: Left;  . HAND SURGERY   1973 / 2010 / 2015   right to release tendons  . HARDWARE REMOVAL Left 10/05/2017   Procedure: Removal of left femoral nail;  Surgeon: [name], MD;  Location: [facility] ORS;  Service: Orthopedics;   Laterality: Left;  90 mins  . HEMATOMA EVACUATION Left 01/19/2018   Procedure: Irrigation and debridement, Evacuation of left total knee hematoma;  Surgeon: [name], MD;  Location: [facility] ORS;  Service: Orthopedics;  Laterality: Left;  . KNEE ARTHROSCOPY  1984 & 2001   twice  . NASAL SEPTUM SURGERY    . removal Left femoral nail      10/05/17 Dr. [name]  . [place] & 2006   Right and left  . TEE WITH CARDIOVERSION  01/12/2018  . TEE WITHOUT CARDIOVERSION N/A 01/12/2018   Procedure: TRANSESOPHAGEAL ECHOCARDIOGRAM (TEE);  Surgeon: Dorothy Spark, MD;  Location[facility] ENDOSCOPY;  Service: Cardiovascular;  Laterality: N/A;  . TONSILLECTOMY    . TOTAL KNEE ARTHROPLASTY Right 01/09/2015   Procedure: RIGHT TOTAL KNEE ARTHROPLASTY;  Surgeon: [name], MD;  Location: [facility] ORS;  Service: Orthopedics;  Laterality: Right;  . TOTAL KNEE ARTHROPLASTY Left 11/23/2017   Procedure: LEFT TOTAL KNEE ARTHROPLASTY;  Surgeon: [name], MD;  Location: [facility] ORS;  Service: Orthopedics;  Laterality: Left;  90 mins   Family History  Problem Relation Age of Onset  . Breast cancer Mother   . Colon cancer Neg Hx   . Esophageal cancer Neg Hx   . Stomach cancer Neg Hx   . Rectal cancer Neg Hx  Allergies  Allergen Reactions  . Sulfa Antibiotics Other (See Comments)    Unknown/childhood allergy    Current Outpatient Medications:  .  amLODipine (NORVASC) 5 MG tablet, Take 5 mg by mouth daily., Disp: , Rfl:  .  atorvastatin (LIPITOR) 20 MG tablet, TAKE 1 TABLET EVERY DAY, Disp: 90 tablet, Rfl: 4 .  Cholecalciferol (VITAMIN D) 2000 UNITS CAPS, Take 2,000 Units by mouth daily at 12 noon. , Disp: , Rfl:  .  levothyroxine (SYNTHROID, LEVOTHROID) 150 MCG tablet, TAKE 1 TABLET EVERY DAY, Disp: 90 tablet, Rfl: 4 .  metoprolol tartrate (LOPRESSOR) 50 MG tablet, TAKE 1 TABLET TWICE DAILY, Disp: 180 tablet, Rfl: 2 .  Multiple Vitamin (MULTIVITAMIN WITH MINERALS) TABS tablet, Take 1  tablet by mouth daily at 12 noon. , Disp: , Rfl:  .  Multiple Vitamins-Minerals (PRESERVISION AREDS 2) CAPS, Take by mouth., Disp: , Rfl:  .  pantoprazole (PROTONIX) 40 MG tablet, TAKE 1 TABLET EVERY DAY, Disp: 90 tablet, Rfl: 4 .  polycarbophil (FIBERCON) 625 MG tablet, Take 1,250 mg by mouth daily at 12 noon. , Disp: , Rfl:  .  valsartan-hydrochlorothiazide (DIOVAN-HCT) 160-25 MG tablet, TAKE 1 TABLET EVERY DAY, Disp: 90 tablet, Rfl: 3 .  venlafaxine XR (EFFEXOR-XR) 75 MG 24 hr capsule, TAKE 1 CAPSULE EVERY DAY, Disp: 90 capsule, Rfl: 3  Review of Systems  Constitutional: Positive for fatigue. Negative for appetite change, chills and fever.  HENT: Positive for congestion.   Respiratory: Negative for chest tightness, shortness of breath and wheezing.   Cardiovascular: Negative for chest pain and palpitations.  Gastrointestinal: Negative for abdominal pain, nausea and vomiting.   Social History   Tobacco Use  . Smoking status: Former Smoker    Last attempt to quit: 12/29/1968    Years since quitting: 49.9  . Smokeless tobacco: Never Used  Substance Use Topics  . Alcohol use: Yes    Alcohol/week: 12.0 standard drinks    Types: 12 Cans of beer per week    Comment: weekly     Objective:   BP 140/80 (BP Location: Right Arm, Patient Position: Sitting, Cuff Size: Large)   Pulse 82   Temp 98.5 F (36.9 C) (Oral)   Resp 16   Wt 219 lb (99.3 kg)   SpO2 97%   BMI 30.54 kg/m   Wt Readings from Last 3 Encounters:  12/13/18 219 lb (99.3 kg)  11/12/18 214 lb 6.4 oz (97.3 kg)  10/11/18 220 lb 6.4 oz (100 kg)   Vitals:   12/13/18 1333  BP: 140/80  Pulse: 82  Resp: 16  Temp: 98.5 F (36.9 C)  TempSrc: Oral  SpO2: 97%  Weight: 219 lb (99.3 kg)   Physical Exam Constitutional:      General: He is not in acute distress.    Appearance: He is well-developed.  HENT:     Head: Normocephalic and atraumatic.     Right Ear: Hearing normal.     Left Ear: Hearing normal.     Nose: Nose  normal.  Eyes:     General: Lids are normal. No scleral icterus.       Right eye: No discharge.        Left eye: No discharge.     Conjunctiva/sclera: Conjunctivae normal.  Neck:     Vascular: No carotid bruit.  Cardiovascular:     Rate and Rhythm: Rhythm irregular.     Heart sounds: Normal heart sounds.  Pulmonary:     Effort: Pulmonary effort is  normal. No respiratory distress.     Breath sounds: Normal breath sounds.  Abdominal:     General: Bowel sounds are normal.  Musculoskeletal: Normal range of motion.  Skin:    Findings: No lesion or rash.  Neurological:     Mental Status: He is alert and oriented to person, place, and time.     Comments: Unchanged right facial droop - congenital.  Psychiatric:        Speech: Speech normal.        Behavior: Behavior normal.        Thought Content: Thought content normal.       Assessment & Plan    1. Atrial fibrillation status post cardioversion [facility]) History of atrial fibrillation with cardioversion on 01-12-18. Presently taking Metoprolol Tartrate 50 mg BID with Valsartan/HCTZ 160/25 mg qd. Over the past few weeks, he has been having more fatigue with exertion and occasionally noticing irregular heart beat. EKG today shows not PR reading. No edema, dyspnea or chest pains. Recommend follow up with cardiologist (Dr. [name]). - aspirin EC 81 MG tablet; Take 81 mg by mouth daily. - EKG 12-Lead - DG Chest 2 View  2. Fatigue, unspecified type Developed more fatigue the past few weeks and concerned about possible pneumonia with slight congestion recently. No rales, wheezes or rhonchi on ascultation. Will check CXR to assess for acute cardiopulmonary disease. No fever or peripheral edema today and pulse oximetry 97%. - DG Chest 2 View     Vernie Murders, [facility]

## 2018-12-14 ENCOUNTER — Encounter: Payer: Self-pay | Admitting: Physician Assistant

## 2018-12-14 ENCOUNTER — Ambulatory Visit: Payer: Medicare HMO | Admitting: Physician Assistant

## 2018-12-14 VITALS — BP 144/94 | HR 74 | Ht 71.0 in | Wt 220.8 lb

## 2018-12-14 DIAGNOSIS — E782 Mixed hyperlipidemia: Secondary | ICD-10-CM

## 2018-12-14 DIAGNOSIS — I4891 Unspecified atrial fibrillation: Secondary | ICD-10-CM | POA: Diagnosis not present

## 2018-12-14 DIAGNOSIS — I10 Essential (primary) hypertension: Secondary | ICD-10-CM | POA: Diagnosis not present

## 2018-12-14 MED ORDER — APIXABAN 5 MG PO TABS: 5.0000 mg | ORAL_TABLET | Freq: Two times a day (BID) | ORAL | 11 refills | Status: DC

## 2018-12-14 NOTE — Progress Notes (Signed)
Cardiology Office Note    Date:  12/14/2018   ID:  Eugene White, DOB 1946-11-20, MRN [id number]  PCP:  [name], PA  Cardiologist: Ena Dawley, MD EPS: None  Chief Complaint  Patient presents with  . Atrial Fibrillation    History of Present Illness:  Eugene White is a 72 y.o. male who had atrial fibrillation in the setting of knee replacement and infection status post cardioversion 01/22/2018.  CHA2DS2-VASc equals 2 he was having bruising from Eliquis and because he had only one episode of atrial fibrillation and normal LV function Dr. [name] stopped his Eliquis 08/18/2018.  He is to call us if he had any recurrent atrial fibrillation.  Patient also has essential hypertension with whitecoat syndrome, hyperlipidemia and left carotid bruit with minimal bilateral carotid stenosis.  Patient saw his PCP yesterday with increased fatigue.  EKG was done and showed recurrent atrial fibrillation. Has had fatigue since last Tues.Marland Kitchen He was trying to lift his dog that past away last tues and fell and hit a piece of furniture when he lost his balance.  He thinks this may be what have triggered it.  Some dyspnea on exertion.    Past Medical History:  Diagnosis Date  . A-fib ([place])   . 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 Anemia    hx of one time  . Arthritis    OA  . Atrial fibrillation with controlled ventricular rate ([place]) 10/05/2017  . Benign essential HTN 05/31/2009  . Closed intertrochanteric fracture of hip, left, initial encounter ([place]) 10/10/2016  . Colon, diverticulosis 05/31/2009  . Difficulty sleeping   . Diverticulitis large intestine 05/31/2009  . Diverticulosis 2014  . Dysrhythmia    atrial fib   . Essential (primary) hypertension 05/31/2009  . 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 GERD (gastroesophageal reflux disease)   . GI symptom    had nausea / vomited once / frequent stools / getting better  . Hyperlipidemia   . Hypertension     . Hypothyroidism   . Mood changes   . Pneumonia    hx of  . Thyroid disease    hypo    Past Surgical History:  Procedure Laterality Date  . CARDIOVERSION N/A 01/12/2018   Procedure: CARDIOVERSION;  Surgeon: Dorothy Spark, MD;  Location: [facility] ENDOSCOPY;  Service: Cardiovascular;  Laterality: N/A;  . COLONOSCOPY  2014  . FEMUR IM NAIL Left 10/11/2016   Procedure: INTRAMEDULLARY (IM) NAIL FEMORAL;  Surgeon: [name], MD;  Location: [facility] ORS;  Service: Orthopedics;  Laterality: Left;  . HAND SURGERY   1973 / 2010 / 2015   right to release tendons  . HARDWARE REMOVAL Left 10/05/2017   Procedure: Removal of left femoral nail;  Surgeon: [name], MD;  Location: [facility] ORS;  Service: Orthopedics;  Laterality: Left;  90 mins  . HEMATOMA EVACUATION Left 01/19/2018   Procedure: Irrigation and debridement, Evacuation of left total knee hematoma;  Surgeon: [name], MD;  Location: [facility] ORS;  Service: Orthopedics;  Laterality: Left;  . KNEE ARTHROSCOPY  1984 & 2001   twice  . NASAL SEPTUM SURGERY    . removal Left femoral nail      10/05/17 Dr. [name]  . [place] & 2006   Right and left  . TEE WITH CARDIOVERSION  01/12/2018  . TEE WITHOUT CARDIOVERSION N/A 01/12/2018   Procedure: TRANSESOPHAGEAL ECHOCARDIOGRAM (TEE);  Surgeon:  Dorothy Spark, MD;  Location: [facility] ENDOSCOPY;  Service: Cardiovascular;  Laterality: N/A;  . TONSILLECTOMY    . TOTAL KNEE ARTHROPLASTY Right 01/09/2015   Procedure: RIGHT TOTAL KNEE ARTHROPLASTY;  Surgeon: [name], MD;  Location: [facility] ORS;  Service: Orthopedics;  Laterality: Right;  . TOTAL KNEE ARTHROPLASTY Left 11/23/2017   Procedure: LEFT TOTAL KNEE ARTHROPLASTY;  Surgeon: [name], MD;  Location: [facility] ORS;  Service: Orthopedics;  Laterality: Left;  90 mins    Current Medications: Current Meds  Medication Sig  . amLODipine (NORVASC) 5 MG tablet Take 5 mg by mouth daily.  Marland Kitchen atorvastatin (LIPITOR) 20 MG tablet TAKE 1  TABLET EVERY DAY  . Cholecalciferol (VITAMIN D) 2000 UNITS CAPS Take 2,000 Units by mouth daily at 12 noon.   Marland Kitchen levothyroxine (SYNTHROID, LEVOTHROID) 150 MCG tablet TAKE 1 TABLET EVERY DAY  . metoprolol tartrate (LOPRESSOR) 50 MG tablet TAKE 1 TABLET TWICE DAILY  . Multiple Vitamin (MULTIVITAMIN WITH MINERALS) TABS tablet Take 1 tablet by mouth daily at 12 noon.   . Multiple Vitamins-Minerals (PRESERVISION AREDS 2) CAPS Take by mouth.  . pantoprazole (PROTONIX) 40 MG tablet TAKE 1 TABLET EVERY DAY  . polycarbophil (FIBERCON) 625 MG tablet Take 1,250 mg by mouth daily at 12 noon.   . valsartan-hydrochlorothiazide (DIOVAN-HCT) 160-25 MG tablet TAKE 1 TABLET EVERY DAY  . venlafaxine XR (EFFEXOR-XR) 75 MG 24 hr capsule TAKE 1 CAPSULE EVERY DAY  . [DISCONTINUED] aspirin EC 81 MG tablet Take 81 mg by mouth daily.     Allergies:   Sulfa antibiotics   Social History   Socioeconomic History  . Marital status: Married    Spouse name: Not on file  . Number of children: 2  . Years of education: Not on file  . Highest education level: Professional school degree (e.g., MD, DDS, DVM, JD)  Occupational History  . Occupation: retired, has degree in physiology  Social Needs  . Financial resource strain: Not hard at all  . Food insecurity:    Worry: Never true    Inability: Never true  . Transportation needs:    Medical: No    Non-medical: No  Tobacco Use  . Smoking status: Former Smoker    Last attempt to quit: 12/29/1968    Years since quitting: 49.9  . Smokeless tobacco: Never Used  Substance and Sexual Activity  . Alcohol use: Yes    Alcohol/week: 12.0 standard drinks    Types: 12 Cans of beer per week    Comment: weekly  . Drug use: No  . Sexual activity: Yes  Lifestyle  . Physical activity:    Days per week: Not on file    Minutes per session: Not on file  . Stress: Not at all  Relationships  . Social connections:    Talks on phone: Patient refused    Gets together: Patient  refused    Attends religious service: Patient refused    Active member of club or organization: Patient refused    Attends meetings of clubs or organizations: Patient refused    Relationship status: Patient refused  Other Topics Concern  . Not on file  Social History Narrative   Pt lives in [place] w/ wife.     Family History:  The patient's family history includes Breast cancer in his mother.   ROS:   Please see the history of present illness.    Review of Systems  Constitution: Positive for malaise/fatigue.  HENT: Negative.   Cardiovascular: Positive  for dyspnea on exertion and irregular heartbeat.  Respiratory: Positive for cough.   Endocrine: Negative.   Hematologic/Lymphatic: Negative.   Musculoskeletal: Positive for back pain.  Gastrointestinal: Negative.   Genitourinary: Negative.   Neurological: Negative.    All other systems reviewed and are negative.   PHYSICAL EXAM:   VS:  BP (!) 144/94   Pulse 74   Ht 5\' 11"  (1.803 m)   Wt 220 lb 12.8 oz (100.2 kg)   SpO2 97%   BMI 30.80 kg/m   Physical Exam  GEN: Well nourished, well developed, in no acute distress  Neck: no JVD, carotid bruits, or masses Cardiac: Irregular irregular; no murmurs, rubs, or gallops  Respiratory:  clear to auscultation bilaterally, normal work of breathing GI: soft, nontender, nondistended, + BS Ext: without cyanosis, clubbing, or edema, Good distal pulses bilaterally Neuro:  Alert and Oriented x 3 Psych: euthymic mood, full affect  Wt Readings from Last 3 Encounters:  12/14/18 220 lb 12.8 oz (100.2 kg)  12/13/18 219 lb (99.3 kg)  11/12/18 214 lb 6.4 oz (97.3 kg)      Studies/Labs Reviewed:   EKG:  EKG is not ordered today.  The ekg reviewed from yesterday shows atrial fibrillation with controlled rate Recent Labs: 01/08/2018: Magnesium 1.8 01/25/2018: NT-Pro BNP 216 10/11/2018: ALT 60; BUN 13; Creatinine, Ser 0.73; Hemoglobin 14.7; Platelets 217; Potassium 4.1; Sodium 137; TSH  2.110   Lipid Panel    Component Value Date/Time   CHOL 201 (H) 10/11/2018 1207   TRIG 153 (H) 10/11/2018 1207   HDL 63 10/11/2018 1207   CHOLHDL 3.2 10/11/2018 1207   CHOLHDL 3.8 10/11/2016 0337   VLDL UNABLE TO CALCULATE IF TRIGLYCERIDE OVER 400 mg/dL 10/11/2016 0337   LDLCALC 107 (H) 10/11/2018 1207    Additional studies/ records that were reviewed today include:  Echo TEE 1/15/2019Study Conclusions   - Left ventricle: Systolic function was normal. The estimated   ejection fraction was in the range of 55% to 60%. Wall motion was   normal; there were no regional wall motion abnormalities. - Aortic valve: There was mild regurgitation. - Mitral valve: There was moderate regurgitation. - Left atrium: The atrium was dilated. No evidence of thrombus in   the atrial cavity or appendage. No evidence of thrombus in the   atrial cavity or appendage. No evidence of thrombus in the atrial   cavity or appendage. - Right atrium: The atrium was dilated. No evidence of thrombus in   the atrial cavity or appendage. - Atrial septum: No defect or patent foramen ovale was identified. - Tricuspid valve: There was moderate regurgitation.   Impressions:   - No cardiac source of emboli was indentified. The study was   followed by a successful cardioversion.       ASSESSMENT:    1. Atrial fibrillation with controlled ventricular rate ([place])   2. Benign essential HTN   3. Mixed hyperlipidemia      PLAN:  In order of problems listed above:  Recurrent atrial fibrillation.  Patient had one episode in the setting of knee replacement and underwent cardioversion 12/2017.  Back in atrial fibrillation now after the loss of his dog.  CHA2DS2-VASc equals 2.  Patient stopped his Eliquis back in October.  Will resume Eliquis 5 mg twice daily.  Stop aspirin.  Follow-up in 3 to 4 weeks for possible cardioversion.  Benign essential hypertension blood pressure elevated today but has whitecoat syndrome  his wife keeps close track of this and  is been stable.  2 g sodium diet.  Hyperlipidemia DL 107 09/2018 on Lipitor   Medication Adjustments/Labs and Tests Ordered: Current medicines are reviewed at length with the patient today.  Concerns regarding medicines are outlined above.  Medication changes, Labs and Tests ordered today are listed in the Patient Instructions below. There are no Patient Instructions on file for this visit.   Signed, Eugene Barrios, PA-C  12/14/2018 2:18 PM    [facility] [address], [place], [place]  [address] Phone: [phone]; Fax: [phone]

## 2018-12-14 NOTE — Patient Instructions (Signed)
Medication Instructions:  Your physician has recommended you make the following change in your medication:   1. STOP Aspirin  2. START: eliquis 5 mg twice a day  If you need a refill on your cardiac medications before your next appointment, please call your pharmacy.   Lab work: TODAY: BMET  If you have labs (blood work) drawn today and your tests are completely normal, you will receive your results only by: Marland Kitchen MyChart Message (if you have MyChart) OR . A paper copy in the mail If you have any lab test that is abnormal or we need to change your treatment, we will call you to review the results.  Testing/Procedures: None ordered  Follow-Up: . Your physician recommends that you schedule a follow-up appointment in: 3-4 WEEKS WITH DR. [name] .   Any Other Special Instructions Will Be Listed Below (If Applicable).   Atrial Fibrillation Atrial fibrillation is a type of heartbeat that is irregular or fast (rapid). If you have this condition, your heart keeps quivering in a weird (chaotic) way. This condition can make it so your heart cannot pump blood normally. Having this condition gives a person more risk for stroke, heart failure, and other heart problems. There are different types of atrial fibrillation. Talk with your doctor to learn about the type that you have. Follow these instructions at home:  Take over-the-counter and prescription medicines only as told by your doctor.  If your doctor prescribed a blood-thinning medicine, take it exactly as told. Taking too much of it can cause bleeding. If you do not take enough of it, you will not have the protection that you need against stroke and other problems.  Do not use any tobacco products. These include cigarettes, chewing tobacco, and e-cigarettes. If you need help quitting, ask your doctor.  If you have apnea (obstructive sleep apnea), manage it as told by your doctor.  Do not drink alcohol.  Do not drink beverages that have  caffeine. These include coffee, soda, and tea.  Maintain a healthy weight. Do not use diet pills unless your doctor says they are safe for you. Diet pills may make heart problems worse.  Follow diet instructions as told by your doctor.  Exercise regularly as told by your doctor.  Keep all follow-up visits as told by your doctor. This is important. Contact a doctor if:  You notice a change in the speed, rhythm, or strength of your heartbeat.  You are taking a blood-thinning medicine and you notice more bruising.  You get tired more easily when you move or exercise. Get help right away if:  You have pain in your chest or your belly (abdomen).  You have sweating or weakness.  You feel sick to your stomach (nauseous).  You notice blood in your throw up (vomit), poop (stool), or pee (urine).  You are short of breath.  You suddenly have swollen feet and ankles.  You feel dizzy.  Your suddenly get weak or numb in your face, arms, or legs, especially if it happens on one side of your body.  You have trouble talking, trouble understanding, or both.  Your face or your eyelid droops on one side. These symptoms may be an emergency. Do not wait to see if the symptoms will go away. Get medical help right away. Call your local emergency services (911 in the [place] Do not drive yourself to the hospital. This information is not intended to replace advice given to you by your health care provider. Make  sure you discuss any questions you have with your health care provider. Document Released: 09/23/2008 Document Revised: 05/22/2016 Document Reviewed: 04/11/2015 Elsevier Interactive Patient Education  [employer].

## 2018-12-15 LAB — BASIC METABOLIC PANEL
BUN / CREAT RATIO: 19 (ref 10–24)
BUN: 13 mg/dL (ref 8–27)
CHLORIDE: 96 mmol/L (ref 96–106)
CO2: 26 mmol/L (ref 20–29)
Calcium: 9.8 mg/dL (ref 8.6–10.2)
Creatinine, Ser: 0.68 mg/dL — ABNORMAL LOW (ref 0.76–1.27)
GFR calc Af Amer: 110 mL/min/{1.73_m2} (ref >59)
GFR calc non Af Amer: 95 mL/min/{1.73_m2} (ref >59)
Glucose: 115 mg/dL — ABNORMAL HIGH (ref 65–99)
POTASSIUM: 4.4 mmol/L (ref 3.5–5.2)
SODIUM: 136 mmol/L (ref 134–144)

## 2019-01-17 ENCOUNTER — Encounter: Payer: Self-pay | Admitting: Cardiology

## 2019-01-17 ENCOUNTER — Encounter: Payer: Self-pay | Admitting: *Deleted

## 2019-01-17 ENCOUNTER — Ambulatory Visit: Payer: Medicare HMO | Admitting: Cardiology

## 2019-01-17 ENCOUNTER — Encounter (INDEPENDENT_AMBULATORY_CARE_PROVIDER_SITE_OTHER): Payer: Self-pay

## 2019-01-17 VITALS — BP 138/102 | HR 71 | Ht 71.0 in | Wt 221.8 lb

## 2019-01-17 DIAGNOSIS — I10 Essential (primary) hypertension: Secondary | ICD-10-CM

## 2019-01-17 DIAGNOSIS — E785 Hyperlipidemia, unspecified: Secondary | ICD-10-CM

## 2019-01-17 DIAGNOSIS — I480 Paroxysmal atrial fibrillation: Secondary | ICD-10-CM | POA: Diagnosis not present

## 2019-01-17 DIAGNOSIS — Z7901 Long term (current) use of anticoagulants: Secondary | ICD-10-CM

## 2019-01-17 DIAGNOSIS — Z01812 Encounter for preprocedural laboratory examination: Secondary | ICD-10-CM

## 2019-01-17 LAB — CBC WITH DIFFERENTIAL/PLATELET
Basophils Absolute: 0 10*3/uL (ref 0.0–0.2)
Basos: 0 %
EOS (ABSOLUTE): 0.1 10*3/uL (ref 0.0–0.4)
Eos: 2 %
Hematocrit: 43.4 % (ref 37.5–51.0)
Hemoglobin: 15.3 g/dL (ref 13.0–17.7)
Lymphocytes Absolute: 1.8 10*3/uL (ref 0.7–3.1)
Lymphs: 27 %
MCH: 33.9 pg — ABNORMAL HIGH (ref 26.6–33.0)
MCHC: 35.3 g/dL (ref 31.5–35.7)
MCV: 96 fL (ref 79–97)
Monocytes Absolute: 0.7 10*3/uL (ref 0.1–0.9)
Monocytes: 11 %
Neutrophils Absolute: 4.1 10*3/uL (ref 1.4–7.0)
Neutrophils: 60 %
Platelets: 249 10*3/uL (ref 150–450)
RBC: 4.51 x10E6/uL (ref 4.14–5.80)
RDW: 14 % (ref 11.6–15.4)
WBC: 6.8 10*3/uL (ref 3.4–10.8)

## 2019-01-17 LAB — PROTIME-INR
INR: 1 (ref 0.8–1.2)
Prothrombin Time: 10.4 s (ref 9.1–12.0)

## 2019-01-17 LAB — BASIC METABOLIC PANEL
BUN/Creatinine Ratio: 18 (ref 10–24)
BUN: 13 mg/dL (ref 8–27)
CO2: 30 mmol/L — ABNORMAL HIGH (ref 20–29)
Calcium: 9.8 mg/dL (ref 8.6–10.2)
Chloride: 99 mmol/L (ref 96–106)
Creatinine, Ser: 0.72 mg/dL — ABNORMAL LOW (ref 0.76–1.27)
GFR calc Af Amer: 107 mL/min/{1.73_m2} (ref >59)
GFR calc non Af Amer: 93 mL/min/{1.73_m2} (ref >59)
Glucose: 106 mg/dL — ABNORMAL HIGH (ref 65–99)
Potassium: 3.7 mmol/L (ref 3.5–5.2)
Sodium: 136 mmol/L (ref 134–144)

## 2019-01-17 NOTE — Progress Notes (Signed)
Cardiology Office Note    Date:  01/17/2019   ID:  Eugene White, DOB [birth year], MRN [id number]  PCP:  [name], PA  Cardiologist: Ena Dawley, MD EPS: None  No chief complaint on file.   History of Present Illness:  Eugene White is a 73 y.o. male who had atrial fibrillation in the setting of knee replacement and infection status post cardioversion 01/22/2018.  CHA2DS2-VASc equals 2 he was having bruising from Eliquis and because he had only one episode of atrial fibrillation and normal LV function Dr. [name] stopped his Eliquis 08/18/2018.  He is to call us if he had any recurrent atrial fibrillation.  Patient also has essential hypertension with whitecoat syndrome, hyperlipidemia and left carotid bruit with minimal bilateral carotid stenosis.  Patient saw his PCP yesterday with increased fatigue.  EKG was done and showed recurrent atrial fibrillation. Has had fatigue since last Tues.Marland Kitchen He was trying to lift his dog that past away last tues and fell and hit a piece of furniture when he lost his balance.  He thinks this may be what have triggered it.  Some dyspnea on exertion.  01/17/2019 -this is 1 months follow-up the patient continues to feel very tired as he is in atrial fibrillation, otherwise denies any dizziness or falls.  He has been compliant with his Eliquis and has not missed any doses and has not noticed any bleeding.  He is compliant with all his other medications and has no side effects.  He would prefer to be in sinus rhythm as he feels significantly better with more energy.  He likes to exercise.  He is going on a cruise in April for 3 weeks.  Past Medical History:  Diagnosis Date  . A-fib ([place])   . 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 Anemia    hx of one time  . Arthritis    OA  . Atrial fibrillation with controlled ventricular rate ([place]) 10/05/2017  . Benign essential HTN 05/31/2009  . Closed intertrochanteric fracture of hip, left, initial encounter ([place])  10/10/2016  . Colon, diverticulosis 05/31/2009  . Difficulty sleeping   . Diverticulitis large intestine 05/31/2009  . Diverticulosis 2014  . Dysrhythmia    atrial fib   . Essential (primary) hypertension 05/31/2009  . 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 GERD (gastroesophageal reflux disease)   . GI symptom    had nausea / vomited once / frequent stools / getting better  . Hyperlipidemia   . Hypertension   . Hypothyroidism   . Mood changes   . Pneumonia    hx of  . Thyroid disease    hypo    Past Surgical History:  Procedure Laterality Date  . CARDIOVERSION N/A 01/12/2018   Procedure: CARDIOVERSION;  Surgeon: Dorothy Spark, MD;  Location: [facility] ENDOSCOPY;  Service: Cardiovascular;  Laterality: N/A;  . COLONOSCOPY  2014  . FEMUR IM NAIL Left 10/11/2016   Procedure: INTRAMEDULLARY (IM) NAIL FEMORAL;  Surgeon: [name], MD;  Location: [facility] ORS;  Service: Orthopedics;  Laterality: Left;  . HAND SURGERY   1973 / 2010 / 2015   right to release tendons  . HARDWARE REMOVAL Left 10/05/2017   Procedure: Removal of left femoral nail;  Surgeon: [name], MD;  Location: [facility] ORS;  Service: Orthopedics;  Laterality: Left;  90 mins  . HEMATOMA EVACUATION Left 01/19/2018   Procedure: Irrigation and debridement, Evacuation of left total knee hematoma;  Surgeon: [name], MD;  Location: [facility] ORS;  Service: Orthopedics;  Laterality: Left;  . KNEE ARTHROSCOPY  1984 & 2001   twice  . NASAL SEPTUM SURGERY    . removal Left femoral nail      10/05/17 Dr. [name]  . [place] & 2006   Right and left  . TEE WITH CARDIOVERSION  01/12/2018  . TEE WITHOUT CARDIOVERSION N/A 01/12/2018   Procedure: TRANSESOPHAGEAL ECHOCARDIOGRAM (TEE);  Surgeon: Dorothy Spark, MD;  Location: [facility] ENDOSCOPY;  Service: Cardiovascular;  Laterality: N/A;  . TONSILLECTOMY    . TOTAL KNEE ARTHROPLASTY Right 01/09/2015   Procedure: RIGHT TOTAL KNEE ARTHROPLASTY;   Surgeon: [name], MD;  Location: [facility] ORS;  Service: Orthopedics;  Laterality: Right;  . TOTAL KNEE ARTHROPLASTY Left 11/23/2017   Procedure: LEFT TOTAL KNEE ARTHROPLASTY;  Surgeon: [name], MD;  Location: [facility] ORS;  Service: Orthopedics;  Laterality: Left;  90 mins    Current Medications: Current Meds  Medication Sig  . amLODipine (NORVASC) 5 MG tablet Take 5 mg by mouth daily.  Marland Kitchen apixaban (ELIQUIS) 5 MG TABS tablet Take 1 tablet (5 mg total) by mouth 2 (two) times daily.  Marland Kitchen atorvastatin (LIPITOR) 20 MG tablet TAKE 1 TABLET EVERY DAY  . Cholecalciferol (VITAMIN D) 2000 UNITS CAPS Take 2,000 Units by mouth daily at 12 noon.   Marland Kitchen levothyroxine (SYNTHROID, LEVOTHROID) 150 MCG tablet TAKE 1 TABLET EVERY DAY  . metoprolol tartrate (LOPRESSOR) 50 MG tablet TAKE 1 TABLET TWICE DAILY  . Multiple Vitamin (MULTIVITAMIN WITH MINERALS) TABS tablet Take 1 tablet by mouth daily at 12 noon.   . Multiple Vitamins-Minerals (PRESERVISION AREDS 2) CAPS Take by mouth.  . pantoprazole (PROTONIX) 40 MG tablet TAKE 1 TABLET EVERY DAY  . polycarbophil (FIBERCON) 625 MG tablet Take 1,250 mg by mouth daily at 12 noon.   . valsartan-hydrochlorothiazide (DIOVAN-HCT) 160-25 MG tablet TAKE 1 TABLET EVERY DAY  . venlafaxine XR (EFFEXOR-XR) 75 MG 24 hr capsule TAKE 1 CAPSULE EVERY DAY     Allergies:   Sulfa antibiotics   Social History   Socioeconomic History  . Marital status: Married    Spouse name: Not on file  . Number of children: 2  . Years of education: Not on file  . Highest education level: Professional school degree (e.g., MD, DDS, DVM, JD)  Occupational History  . Occupation: retired, has degree in physiology  Social Needs  . Financial resource strain: Not hard at all  . Food insecurity:    Worry: Never true    Inability: Never true  . Transportation needs:    Medical: No    Non-medical: No  Tobacco Use  . Smoking status: Former Smoker    Last attempt to quit: 12/29/1968    Years since  quitting: 50.0  . Smokeless tobacco: Never Used  Substance and Sexual Activity  . Alcohol use: Yes    Alcohol/week: 12.0 standard drinks    Types: 12 Cans of beer per week    Comment: weekly  . Drug use: No  . Sexual activity: Yes  Lifestyle  . Physical activity:    Days per week: Not on file    Minutes per session: Not on file  . Stress: Not at all  Relationships  . Social connections:    Talks on phone: Patient refused    Gets together: Patient refused    Attends religious service: Patient refused    Active member of club  or organization: Patient refused    Attends meetings of clubs or organizations: Patient refused    Relationship status: Patient refused  Other Topics Concern  . Not on file  Social History Narrative   Pt lives in [place] w/ wife.     Family History:  The patient's family history includes Breast cancer in his mother.   ROS:   Please see the history of present illness.    Review of Systems  Constitution: Positive for malaise/fatigue.  HENT: Negative.   Cardiovascular: Positive for dyspnea on exertion and irregular heartbeat.  Respiratory: Positive for cough.   Endocrine: Negative.   Hematologic/Lymphatic: Negative.   Musculoskeletal: Positive for back pain.  Gastrointestinal: Negative.   Genitourinary: Negative.   Neurological: Negative.    All other systems reviewed and are negative.   PHYSICAL EXAM:   VS:  BP (!) 138/102   Pulse 71   Ht 5\' 11"  (1.803 m)   Wt 221 lb 12.8 oz (100.6 kg)   SpO2 99%   BMI 30.93 kg/m   Physical Exam  GEN: Well nourished, well developed, in no acute distress  Neck: no JVD, carotid bruits, or masses Cardiac: Irregular irregular; no murmurs, rubs, or gallops  Respiratory:  clear to auscultation bilaterally, normal work of breathing GI: soft, nontender, nondistended, + BS Ext: without cyanosis, clubbing, or edema, Good distal pulses bilaterally Neuro:  Alert and Oriented x 3 Psych: euthymic mood, full  affect  Wt Readings from Last 3 Encounters:  01/17/19 221 lb 12.8 oz (100.6 kg)  12/14/18 220 lb 12.8 oz (100.2 kg)  12/13/18 219 lb (99.3 kg)      Studies/Labs Reviewed:   EKG:  EKG is not ordered today.  The ekg reviewed from yesterday shows atrial fibrillation with controlled rate Recent Labs: 01/25/2018: NT-Pro BNP 216 10/11/2018: ALT 60; Hemoglobin 14.7; Platelets 217; TSH 2.110 12/14/2018: BUN 13; Creatinine, Ser 0.68; Potassium 4.4; Sodium 136   Lipid Panel    Component Value Date/Time   CHOL 201 (H) 10/11/2018 1207   TRIG 153 (H) 10/11/2018 1207   HDL 63 10/11/2018 1207   CHOLHDL 3.2 10/11/2018 1207   CHOLHDL 3.8 10/11/2016 0337   VLDL UNABLE TO CALCULATE IF TRIGLYCERIDE OVER 400 mg/dL 10/11/2016 0337   LDLCALC 107 (H) 10/11/2018 1207    Additional studies/ records that were reviewed today include:  Echo TEE 1/15/2019Study Conclusions   - Left ventricle: Systolic function was normal. The estimated   ejection fraction was in the range of 55% to 60%. Wall motion was   normal; there were no regional wall motion abnormalities. - Aortic valve: There was mild regurgitation. - Mitral valve: There was moderate regurgitation. - Left atrium: The atrium was dilated. No evidence of thrombus in   the atrial cavity or appendage. No evidence of thrombus in the   atrial cavity or appendage. No evidence of thrombus in the atrial   cavity or appendage. - Right atrium: The atrium was dilated. No evidence of thrombus in   the atrial cavity or appendage. - Atrial septum: No defect or patent foramen ovale was identified. - Tricuspid valve: There was moderate regurgitation.   Impressions:   - No cardiac source of emboli was indentified. The study was   followed by a successful cardioversion.   ASSESSMENT:    1. PAF (paroxysmal atrial fibrillation) ([place])   2. Chronic anticoagulation   3. Hyperlipidemia, unspecified hyperlipidemia type   4. Essential hypertension      PLAN:   In order  of problems listed above:  Recurrent atrial fibrillation.  Patient had one episode in the setting of knee replacement and underwent cardioversion 12/2017.  Back in atrial fibrillation now after the loss of his dog in November 2019.  CHA2DS2-VASc equals 2.  We restarted Eliquis, now minimal in 4 weeks, no missed doses, we will schedule electrical cardioversion later this week.  We will obtain labs today.  Follow-up in 2 months prior to his cruise in April.  Benign essential hypertension -blood pressures well controlled.  Hyperlipidemia DL 107 09/2018 on Lipitor   Medication Adjustments/Labs and Tests Ordered: Current medicines are reviewed at length with the patient today.  Concerns regarding medicines are outlined above.  Medication changes, Labs and Tests ordered today are listed in the Patient Instructions below. There are no Patient Instructions on file for this visit.   Signed, Ena Dawley, MD  01/17/2019 2:35 PM    [address], [place], [place]  [address] Phone: [phone]; Fax: [phone]

## 2019-01-17 NOTE — Addendum Note (Signed)
Addended by: Dorothy Spark on: 01/17/2019 03:17 PM   Modules accepted: Orders, SmartSet

## 2019-01-17 NOTE — H&P (View-Only) (Signed)
Cardiology Office Note    Date:  01/17/2019   ID:  Eugene White, DOB [birth year], MRN [id number]  PCP:  [name], PA  Cardiologist: Ena Dawley, MD EPS: None  No chief complaint on file.   History of Present Illness:  Eugene White is a 73 y.o. male who had atrial fibrillation in the setting of knee replacement and infection status post cardioversion 01/22/2018.  CHA2DS2-VASc equals 2 he was having bruising from Eliquis and because he had only one episode of atrial fibrillation and normal LV function Dr. [name] stopped his Eliquis 08/18/2018.  He is to call us if he had any recurrent atrial fibrillation.  Patient also has essential hypertension with whitecoat syndrome, hyperlipidemia and left carotid bruit with minimal bilateral carotid stenosis.  Patient saw his PCP yesterday with increased fatigue.  EKG was done and showed recurrent atrial fibrillation. Has had fatigue since last Tues.Marland Kitchen He was trying to lift his dog that past away last tues and fell and hit a piece of furniture when he lost his balance.  He thinks this may be what have triggered it.  Some dyspnea on exertion.  01/17/2019 -this is 1 months follow-up the patient continues to feel very tired as he is in atrial fibrillation, otherwise denies any dizziness or falls.  He has been compliant with his Eliquis and has not missed any doses and has not noticed any bleeding.  He is compliant with all his other medications and has no side effects.  He would prefer to be in sinus rhythm as he feels significantly better with more energy.  He likes to exercise.  He is going on a cruise in April for 3 weeks.  Past Medical History:  Diagnosis Date  . A-fib ([place])   . 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 Anemia    hx of one time  . Arthritis    OA  . Atrial fibrillation with controlled ventricular rate ([place]) 10/05/2017  . Benign essential HTN 05/31/2009  . Closed intertrochanteric fracture of hip, left, initial encounter ([place])  10/10/2016  . Colon, diverticulosis 05/31/2009  . Difficulty sleeping   . Diverticulitis large intestine 05/31/2009  . Diverticulosis 2014  . Dysrhythmia    atrial fib   . Essential (primary) hypertension 05/31/2009  . 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 GERD (gastroesophageal reflux disease)   . GI symptom    had nausea / vomited once / frequent stools / getting better  . Hyperlipidemia   . Hypertension   . Hypothyroidism   . Mood changes   . Pneumonia    hx of  . Thyroid disease    hypo    Past Surgical History:  Procedure Laterality Date  . CARDIOVERSION N/A 01/12/2018   Procedure: CARDIOVERSION;  Surgeon: Dorothy Spark, MD;  Location: [facility] ENDOSCOPY;  Service: Cardiovascular;  Laterality: N/A;  . COLONOSCOPY  2014  . FEMUR IM NAIL Left 10/11/2016   Procedure: INTRAMEDULLARY (IM) NAIL FEMORAL;  Surgeon: [name], MD;  Location: [facility] ORS;  Service: Orthopedics;  Laterality: Left;  . HAND SURGERY   1973 / 2010 / 2015   right to release tendons  . HARDWARE REMOVAL Left 10/05/2017   Procedure: Removal of left femoral nail;  Surgeon: [name], MD;  Location: [facility] ORS;  Service: Orthopedics;  Laterality: Left;  90 mins  . HEMATOMA EVACUATION Left 01/19/2018   Procedure: Irrigation and debridement, Evacuation of left total knee hematoma;  Surgeon: [name], MD;  Location: [facility] ORS;  Service: Orthopedics;  Laterality: Left;  . KNEE ARTHROSCOPY  1984 & 2001   twice  . NASAL SEPTUM SURGERY    . removal Left femoral nail      10/05/17 Dr. [name]  . [place] & 2006   Right and left  . TEE WITH CARDIOVERSION  01/12/2018  . TEE WITHOUT CARDIOVERSION N/A 01/12/2018   Procedure: TRANSESOPHAGEAL ECHOCARDIOGRAM (TEE);  Surgeon: Dorothy Spark, MD;  Location: [facility] ENDOSCOPY;  Service: Cardiovascular;  Laterality: N/A;  . TONSILLECTOMY    . TOTAL KNEE ARTHROPLASTY Right 01/09/2015   Procedure: RIGHT TOTAL KNEE ARTHROPLASTY;   Surgeon: [name], MD;  Location: [facility] ORS;  Service: Orthopedics;  Laterality: Right;  . TOTAL KNEE ARTHROPLASTY Left 11/23/2017   Procedure: LEFT TOTAL KNEE ARTHROPLASTY;  Surgeon: [name], MD;  Location: [facility] ORS;  Service: Orthopedics;  Laterality: Left;  90 mins    Current Medications: Current Meds  Medication Sig  . amLODipine (NORVASC) 5 MG tablet Take 5 mg by mouth daily.  Marland Kitchen apixaban (ELIQUIS) 5 MG TABS tablet Take 1 tablet (5 mg total) by mouth 2 (two) times daily.  Marland Kitchen atorvastatin (LIPITOR) 20 MG tablet TAKE 1 TABLET EVERY DAY  . Cholecalciferol (VITAMIN D) 2000 UNITS CAPS Take 2,000 Units by mouth daily at 12 noon.   Marland Kitchen levothyroxine (SYNTHROID, LEVOTHROID) 150 MCG tablet TAKE 1 TABLET EVERY DAY  . metoprolol tartrate (LOPRESSOR) 50 MG tablet TAKE 1 TABLET TWICE DAILY  . Multiple Vitamin (MULTIVITAMIN WITH MINERALS) TABS tablet Take 1 tablet by mouth daily at 12 noon.   . Multiple Vitamins-Minerals (PRESERVISION AREDS 2) CAPS Take by mouth.  . pantoprazole (PROTONIX) 40 MG tablet TAKE 1 TABLET EVERY DAY  . polycarbophil (FIBERCON) 625 MG tablet Take 1,250 mg by mouth daily at 12 noon.   . valsartan-hydrochlorothiazide (DIOVAN-HCT) 160-25 MG tablet TAKE 1 TABLET EVERY DAY  . venlafaxine XR (EFFEXOR-XR) 75 MG 24 hr capsule TAKE 1 CAPSULE EVERY DAY     Allergies:   Sulfa antibiotics   Social History   Socioeconomic History  . Marital status: Married    Spouse name: Not on file  . Number of children: 2  . Years of education: Not on file  . Highest education level: Professional school degree (e.g., MD, DDS, DVM, JD)  Occupational History  . Occupation: retired, has degree in physiology  Social Needs  . Financial resource strain: Not hard at all  . Food insecurity:    Worry: Never true    Inability: Never true  . Transportation needs:    Medical: No    Non-medical: No  Tobacco Use  . Smoking status: Former Smoker    Last attempt to quit: 12/29/1968    Years since  quitting: 50.0  . Smokeless tobacco: Never Used  Substance and Sexual Activity  . Alcohol use: Yes    Alcohol/week: 12.0 standard drinks    Types: 12 Cans of beer per week    Comment: weekly  . Drug use: No  . Sexual activity: Yes  Lifestyle  . Physical activity:    Days per week: Not on file    Minutes per session: Not on file  . Stress: Not at all  Relationships  . Social connections:    Talks on phone: Patient refused    Gets together: Patient refused    Attends religious service: Patient refused    Active member of club  or organization: Patient refused    Attends meetings of clubs or organizations: Patient refused    Relationship status: Patient refused  Other Topics Concern  . Not on file  Social History Narrative   Pt lives in [place] w/ wife.     Family History:  The patient's family history includes Breast cancer in his mother.   ROS:   Please see the history of present illness.    Review of Systems  Constitution: Positive for malaise/fatigue.  HENT: Negative.   Cardiovascular: Positive for dyspnea on exertion and irregular heartbeat.  Respiratory: Positive for cough.   Endocrine: Negative.   Hematologic/Lymphatic: Negative.   Musculoskeletal: Positive for back pain.  Gastrointestinal: Negative.   Genitourinary: Negative.   Neurological: Negative.    All other systems reviewed and are negative.   PHYSICAL EXAM:   VS:  BP (!) 138/102   Pulse 71   Ht 5\' 11"  (1.803 m)   Wt 221 lb 12.8 oz (100.6 kg)   SpO2 99%   BMI 30.93 kg/m   Physical Exam  GEN: Well nourished, well developed, in no acute distress  Neck: no JVD, carotid bruits, or masses Cardiac: Irregular irregular; no murmurs, rubs, or gallops  Respiratory:  clear to auscultation bilaterally, normal work of breathing GI: soft, nontender, nondistended, + BS Ext: without cyanosis, clubbing, or edema, Good distal pulses bilaterally Neuro:  Alert and Oriented x 3 Psych: euthymic mood, full  affect  Wt Readings from Last 3 Encounters:  01/17/19 221 lb 12.8 oz (100.6 kg)  12/14/18 220 lb 12.8 oz (100.2 kg)  12/13/18 219 lb (99.3 kg)      Studies/Labs Reviewed:   EKG:  EKG is not ordered today.  The ekg reviewed from yesterday shows atrial fibrillation with controlled rate Recent Labs: 01/25/2018: NT-Pro BNP 216 10/11/2018: ALT 60; Hemoglobin 14.7; Platelets 217; TSH 2.110 12/14/2018: BUN 13; Creatinine, Ser 0.68; Potassium 4.4; Sodium 136   Lipid Panel    Component Value Date/Time   CHOL 201 (H) 10/11/2018 1207   TRIG 153 (H) 10/11/2018 1207   HDL 63 10/11/2018 1207   CHOLHDL 3.2 10/11/2018 1207   CHOLHDL 3.8 10/11/2016 0337   VLDL UNABLE TO CALCULATE IF TRIGLYCERIDE OVER 400 mg/dL 10/11/2016 0337   LDLCALC 107 (H) 10/11/2018 1207    Additional studies/ records that were reviewed today include:  Echo TEE 1/15/2019Study Conclusions   - Left ventricle: Systolic function was normal. The estimated   ejection fraction was in the range of 55% to 60%. Wall motion was   normal; there were no regional wall motion abnormalities. - Aortic valve: There was mild regurgitation. - Mitral valve: There was moderate regurgitation. - Left atrium: The atrium was dilated. No evidence of thrombus in   the atrial cavity or appendage. No evidence of thrombus in the   atrial cavity or appendage. No evidence of thrombus in the atrial   cavity or appendage. - Right atrium: The atrium was dilated. No evidence of thrombus in   the atrial cavity or appendage. - Atrial septum: No defect or patent foramen ovale was identified. - Tricuspid valve: There was moderate regurgitation.   Impressions:   - No cardiac source of emboli was indentified. The study was   followed by a successful cardioversion.   ASSESSMENT:    1. PAF (paroxysmal atrial fibrillation) ([place])   2. Chronic anticoagulation   3. Hyperlipidemia, unspecified hyperlipidemia type   4. Essential hypertension      PLAN:   In order  of problems listed above:  Recurrent atrial fibrillation.  Patient had one episode in the setting of knee replacement and underwent cardioversion 12/2017.  Back in atrial fibrillation now after the loss of his dog in November 2019.  CHA2DS2-VASc equals 2.  We restarted Eliquis, now minimal in 4 weeks, no missed doses, we will schedule electrical cardioversion later this week.  We will obtain labs today.  Follow-up in 2 months prior to his cruise in April.  Benign essential hypertension -blood pressures well controlled.  Hyperlipidemia DL 107 09/2018 on Lipitor   Medication Adjustments/Labs and Tests Ordered: Current medicines are reviewed at length with the patient today.  Concerns regarding medicines are outlined above.  Medication changes, Labs and Tests ordered today are listed in the Patient Instructions below. There are no Patient Instructions on file for this visit.   Signed, Ena Dawley, MD  01/17/2019 2:35 PM    [address], [place], [place]  [address] Phone: [phone]; Fax: [phone]

## 2019-01-17 NOTE — Addendum Note (Signed)
Addended by: Marciano Sequin on: 01/17/2019 03:05 PM   Modules accepted: Orders

## 2019-01-17 NOTE — Patient Instructions (Addendum)
Medication Instructions:   Your physician recommends that you continue on your current medications as directed. Please refer to the Current Medication list given to you today.    Labwork:  TODAY--PRE-PROCEDURE LAB EXAM FOR YOUR UPCOMING CARDIOVERSION--BMET, CBC W DIFF, PT/INR     Testing/Procedures:  Your physician has requested that you have a Cardioversion.  Electrical Cardioversion uses a jolt of electricity to your heart either through paddles or wired patches attached to your chest. This is a controlled, usually prescheduled, procedure. This procedure is done at the hospital and you are not awake during the procedure. You usually go home the day of the procedure. Please see the instruction sheet given to you today for more information.  YOUR CARDIOVERSION IS SCHEDULED FOR NEXT Wednesday 01/26/2019 AT 9:45 AM--YOU MUST ARRIVE AT [place] 1 1/2 HOURS PRIOR TO THIS PROCEDURE--PLEASE SEE INSTRUCTIONS GIVEN TO YOU TODAY.     Follow-Up:  AS PLANNED WITH DR [name] FOR 03/25/2019 AT 1:20 PM.     If you need a refill on your cardiac medications before your next appointment, please call your pharmacy.

## 2019-01-20 ENCOUNTER — Telehealth: Payer: Self-pay | Admitting: Cardiology

## 2019-01-20 NOTE — Telephone Encounter (Signed)
Can he have cortisone injection after the DCCV?

## 2019-01-20 NOTE — Telephone Encounter (Signed)
Dr. [name], already spoke with the pt and wife.  They are cancelling his cortisone shot, for this is not urgent and proceed with DCCV as scheduled.  [facility] will fax [place] a clearance note when they are deciding to reschedule his cortisone shot, so that we can safely advise on any cardiac meds that may need to be held prior to the injection.

## 2019-01-20 NOTE — Telephone Encounter (Signed)
New message   Patient's wife states that the patient is scheduled for a cardioversion on 01/26/2019 and is scheduled for a cortisone shot on 01/25/2019. Patient's wife would like to know if this shot will affect the cardioversion? Please call to discuss.

## 2019-01-20 NOTE — Telephone Encounter (Signed)
Endorsed to the pt and wife of our Pharmacist Georgina Peer Auten's recommendations about the pt having a cortisone injection the day prior to his DCCV.  Advised the pt and wife to have [facility] reschedule his cortisone injection to another time, and have them fax our office a clearance note for this procedure, per protocol.  Wife verbalized understanding and agrees with this plan.  Wife and pt state the injection is not urgent at all, and they will cancel this, and have [facility] fax [place] a clearance note to advise on, in the near future.  Wife and pt gracious for all the helpful information.

## 2019-01-20 NOTE — Telephone Encounter (Signed)
Dr. [name], Eugene White or [name], pt is scheduled for a cardioversion on 01/26/19, and is now endorsing that he is to get a cortisone shot the day before the DCCV on 01/25/19. Pt and wife is wanting to know will the cortisone shot interfere with his scheduled DCCV on 01/26/19? The Physician doing his cortisone shot, did not obtain clearance from our office for this, per our protocol. Please advise.

## 2019-01-20 NOTE — Telephone Encounter (Signed)
He will not be able to proceed with DCCV if he is required to hold anticoagulation prior to injection. Depending on location of injection he may or may not need to hold anticoagulation.

## 2019-01-26 ENCOUNTER — Encounter (HOSPITAL_COMMUNITY)
Admission: RE | Disposition: A | Discharge status: Home / Self Care | Payer: Self-pay | Source: Home / Self Care | Attending: Cardiology

## 2019-01-26 ENCOUNTER — Ambulatory Visit (HOSPITAL_COMMUNITY): Payer: Medicare HMO | Admitting: Certified Registered Nurse Anesthetist

## 2019-01-26 ENCOUNTER — Encounter (HOSPITAL_COMMUNITY): Payer: Self-pay | Admitting: *Deleted

## 2019-01-26 ENCOUNTER — Ambulatory Visit (HOSPITAL_COMMUNITY)
Admission: RE | Admit: 2019-01-26 | Discharge: 2019-01-26 | Disposition: A | Discharge status: Home / Self Care | Payer: Medicare HMO | Attending: Cardiology | Admitting: Cardiology

## 2019-01-26 ENCOUNTER — Other Ambulatory Visit: Payer: Self-pay

## 2019-01-26 DIAGNOSIS — Z882 Allergy status to sulfonamides status: Secondary | ICD-10-CM | POA: Diagnosis not present

## 2019-01-26 DIAGNOSIS — I10 Essential (primary) hypertension: Secondary | ICD-10-CM | POA: Insufficient documentation

## 2019-01-26 DIAGNOSIS — K219 Gastro-esophageal reflux disease without esophagitis: Secondary | ICD-10-CM | POA: Diagnosis not present

## 2019-01-26 DIAGNOSIS — I480 Paroxysmal atrial fibrillation: Secondary | ICD-10-CM | POA: Diagnosis not present

## 2019-01-26 DIAGNOSIS — Z87891 Personal history of nicotine dependence: Secondary | ICD-10-CM | POA: Diagnosis not present

## 2019-01-26 DIAGNOSIS — M1990 Unspecified osteoarthritis, unspecified site: Secondary | ICD-10-CM | POA: Insufficient documentation

## 2019-01-26 DIAGNOSIS — I4819 Other persistent atrial fibrillation: Secondary | ICD-10-CM | POA: Diagnosis not present

## 2019-01-26 DIAGNOSIS — I4891 Unspecified atrial fibrillation: Secondary | ICD-10-CM | POA: Diagnosis not present

## 2019-01-26 DIAGNOSIS — R0609 Other forms of dyspnea: Secondary | ICD-10-CM | POA: Insufficient documentation

## 2019-01-26 DIAGNOSIS — Z7901 Long term (current) use of anticoagulants: Secondary | ICD-10-CM | POA: Diagnosis not present

## 2019-01-26 DIAGNOSIS — E785 Hyperlipidemia, unspecified: Secondary | ICD-10-CM | POA: Insufficient documentation

## 2019-01-26 DIAGNOSIS — I6523 Occlusion and stenosis of bilateral carotid arteries: Secondary | ICD-10-CM | POA: Insufficient documentation

## 2019-01-26 DIAGNOSIS — E039 Hypothyroidism, unspecified: Secondary | ICD-10-CM | POA: Diagnosis not present

## 2019-01-26 DIAGNOSIS — Z79899 Other long term (current) drug therapy: Secondary | ICD-10-CM | POA: Insufficient documentation

## 2019-01-26 HISTORY — PX: CARDIOVERSION: SHX1299

## 2019-01-26 SURGERY — CARDIOVERSION
Anesthesia: General

## 2019-01-26 MED ORDER — PROPOFOL 10 MG/ML IV BOLUS
INTRAVENOUS | Status: DC | PRN
  Administered 2019-01-26: 20 mg via INTRAVENOUS
  Administered 2019-01-26: 50 mg via INTRAVENOUS
  Administered 2019-01-26: 10 mg via INTRAVENOUS

## 2019-01-26 MED ORDER — SODIUM CHLORIDE 0.9 % IV SOLN
INTRAVENOUS | Status: DC | PRN
  Administered 2019-01-26: 10:00:00 via INTRAVENOUS

## 2019-01-26 MED ORDER — LIDOCAINE 2% (20 MG/ML) 5 ML SYRINGE
INTRAMUSCULAR | Status: DC | PRN
  Administered 2019-01-26: 60 mg via INTRAVENOUS

## 2019-01-26 NOTE — Interval H&P Note (Signed)
History and Physical Interval Note:  01/26/2019 8:40 AM  Eugene White  has presented today for surgery, with the diagnosis of AFIB  The various methods of treatment have been discussed with the patient and family. After consideration of risks, benefits and other options for treatment, the patient has consented to  Procedure(s): CARDIOVERSION (N/A) as a surgical intervention .  The patient's history has been reviewed, patient examined, no change in status, stable for surgery.  I have reviewed the patient's chart and labs.  Questions were answered to the patient's satisfaction.     [employer]

## 2019-01-26 NOTE — Anesthesia Preprocedure Evaluation (Signed)
Anesthesia Evaluation  Patient identified by MRN, date of birth, ID band Patient awake    Reviewed: Allergy & Precautions, NPO status , Patient's Chart, lab work & pertinent test results  History of Anesthesia Complications Negative for: history of anesthetic complications  Airway Mallampati: II  TM Distance: >3 FB Neck ROM: Full    Dental no notable dental hx.    Pulmonary neg pulmonary ROS, former smoker,    Pulmonary exam normal        Cardiovascular hypertension, + dysrhythmias Atrial Fibrillation  Rhythm:Irregular Rate:Bradycardia     Neuro/Psych negative neurological ROS  negative psych ROS   GI/Hepatic Neg liver ROS, GERD  ,  Endo/Other  Hypothyroidism   Renal/GU negative Renal ROS  negative genitourinary   Musculoskeletal negative musculoskeletal ROS (+)   Abdominal   Peds  Hematology negative hematology ROS (+)   Anesthesia Other Findings   Reproductive/Obstetrics                             Anesthesia Physical Anesthesia Plan  ASA: III  Anesthesia Plan: General   Post-op Pain Management:    Induction: Intravenous  PONV Risk Score and Plan: 2 and TIVA and Treatment may vary due to age or medical condition  Airway Management Planned: Mask  Additional Equipment: None  Intra-op Plan:   Post-operative Plan:   Informed Consent: I have reviewed the patients History and Physical, chart, labs and discussed the procedure including the risks, benefits and alternatives for the proposed anesthesia with the patient or authorized representative who has indicated his/her understanding and acceptance.       Plan Discussed with:   Anesthesia Plan Comments:         Anesthesia Quick Evaluation

## 2019-01-26 NOTE — Discharge Instructions (Signed)
Electrical Cardioversion, Care After °This sheet gives you information about how to care for yourself after your procedure. Your health care provider may also give you more specific instructions. If you have problems or questions, contact your health care provider. °What can I expect after the procedure? °After the procedure, it is common to have: °· Some redness on the skin where the shocks were given. °Follow these instructions at home: ° °· Do not drive for 24 hours if you were given a medicine to help you relax (sedative). °· Take over-the-counter and prescription medicines only as told by your health care provider. °· Ask your health care provider how to check your pulse. Check it often. °· Rest for 48 hours after the procedure or as told by your health care provider. °· Avoid or limit your caffeine use as told by your health care provider. °Contact a health care provider if: °· You feel like your heart is beating too quickly or your pulse is not regular. °· You have a serious muscle cramp that does not go away. °Get help right away if: ° °· You have discomfort in your chest. °· You are dizzy or you feel faint. °· You have trouble breathing or you are short of breath. °· Your speech is slurred. °· You have trouble moving an arm or leg on one side of your body. °· Your fingers or toes turn cold or blue. °This information is not intended to replace advice given to you by your health care provider. Make sure you discuss any questions you have with your health care provider. °Document Released: 10/05/2013 Document Revised: 07/18/2016 Document Reviewed: 06/20/2016 °Elsevier Interactive Patient Education © 2019 [employer] ° °

## 2019-01-26 NOTE — Transfer of Care (Signed)
Immediate Anesthesia Transfer of Care Note  Patient: Eugene White  Procedure(s) Performed: CARDIOVERSION (N/A )  Patient Location: Endoscopy Unit  Anesthesia Type:General  Level of Consciousness: awake and patient cooperative  Airway & Oxygen Therapy: Patient Spontanous Breathing  Post-op Assessment: Report given to RN and Post -op Vital signs reviewed and stable  Post vital signs: Reviewed and stable  Last Vitals:  Vitals Value Taken Time  BP 151/79   Temp    Pulse 60    Resp    SpO2 97     Last Pain:  Vitals:   01/26/19 0835  TempSrc: Oral  PainSc: 0-No pain         Complications: No apparent anesthesia complications

## 2019-01-26 NOTE — Anesthesia Postprocedure Evaluation (Signed)
Anesthesia Post Note  Patient: Eugene White  Procedure(s) Performed: CARDIOVERSION (N/A )     Patient location during evaluation: Endoscopy Anesthesia Type: General Level of consciousness: awake and alert Pain management: pain level controlled Vital Signs Assessment: post-procedure vital signs reviewed and stable Respiratory status: spontaneous breathing, nonlabored ventilation and respiratory function stable Cardiovascular status: blood pressure returned to baseline and stable Postop Assessment: no apparent nausea or vomiting Anesthetic complications: no    Last Vitals:  Vitals:   01/26/19 0950 01/26/19 1000  BP: (!) 151/79 (!) 166/94  Pulse: (!) 31 (!) 52  Resp: 16 12  Temp:    SpO2: 97% 96%    Last Pain:  Vitals:   01/26/19 1000  TempSrc:   PainSc: 0-No pain                 Lidia Collum

## 2019-01-26 NOTE — CV Procedure (Signed)
    Electrical Cardioversion Procedure Note Eugene White [id number] [birth year]  Procedure: Electrical Cardioversion Indications:  Atrial Fibrillation  Time Out: Verified patient identification, verified procedure,medications/allergies/relevent history reviewed, required imaging and test results available.  Performed  Procedure Details  The patient was NPO after midnight. Anesthesia was administered at the beside  by Dr. Christella White with propofol.  Cardioversion was performed with synchronized biphasic defibrillation via AP pads with 120 joules.  1 attempt(s) were performed.  The patient converted to normal sinus rhythm. The patient tolerated the procedure well   IMPRESSION:  Successful cardioversion of atrial fibrillation    Eugene White 01/26/2019, 9:47 AM

## 2019-01-28 ENCOUNTER — Encounter (HOSPITAL_COMMUNITY): Payer: Self-pay | Admitting: Cardiology

## 2019-02-01 DIAGNOSIS — S8263XA Displaced fracture of lateral malleolus of unspecified fibula, initial encounter for closed fracture: Secondary | ICD-10-CM | POA: Insufficient documentation

## 2019-02-01 DIAGNOSIS — S8265XA Nondisplaced fracture of lateral malleolus of left fibula, initial encounter for closed fracture: Secondary | ICD-10-CM | POA: Diagnosis not present

## 2019-02-08 ENCOUNTER — Telehealth: Payer: Self-pay | Admitting: Cardiology

## 2019-02-08 NOTE — Telephone Encounter (Signed)
° °  Patient spouse calling to report weakness.   1) What is your heart rate? 85  2) Do you have a log of your heart rate readings (document readings)?   3) Do you have any other symptoms? WEAKNESS

## 2019-02-08 NOTE — Telephone Encounter (Signed)
Pt calling in to report to Dr [name] and myself that he has been having generalized weakness, which he associates with going in and out of afib.  Pt does have a history of PAF.  Pt states his rate has been very controlled, running no higher than the upper 80s.  Pt states he has no chest pain with this, no sob, doe, dizziness, pre-syncopal or syncopal episodes.  Pt states that he did fracture his fibula last week, so that's been an extra stressor he's been dealing with since then.  Pt states there will be no surgery done, for he states this was a clean break and he will only be in a boot for a couple weeks.  Pt states his wife advised him to call us and arrange for him to see an Extender in the office for sometime this week, so that he could be assessed and make sure there are no other arrhythmias present.  Pts wife is a retired Therapist, sports.  Pt states he cannot come in tomorrow, for he has another MD appt with Dermatology.  Scheduled the pt to see Daune Perch NP for this Thursday 02/10/19 at 1130.  Pt aware to arrive 15 mins prior to this appt.  Advised the pt that if his symptoms worsen, his rate increases, or any other cardiac issues arise, he should report to the ER or notify the office. Informed the pt that I will route this message to Dr [name] as an Juluis Rainier, to make her aware of this plan.  Pt verbalized understanding and agrees with this plan.

## 2019-02-09 DIAGNOSIS — L570 Actinic keratosis: Secondary | ICD-10-CM | POA: Diagnosis not present

## 2019-02-09 DIAGNOSIS — D225 Melanocytic nevi of trunk: Secondary | ICD-10-CM | POA: Diagnosis not present

## 2019-02-09 DIAGNOSIS — L820 Inflamed seborrheic keratosis: Secondary | ICD-10-CM | POA: Diagnosis not present

## 2019-02-09 DIAGNOSIS — D2230 Melanocytic nevi of unspecified part of face: Secondary | ICD-10-CM | POA: Diagnosis not present

## 2019-02-09 DIAGNOSIS — Z1283 Encounter for screening for malignant neoplasm of skin: Secondary | ICD-10-CM | POA: Diagnosis not present

## 2019-02-09 DIAGNOSIS — L821 Other seborrheic keratosis: Secondary | ICD-10-CM | POA: Diagnosis not present

## 2019-02-09 DIAGNOSIS — L578 Other skin changes due to chronic exposure to nonionizing radiation: Secondary | ICD-10-CM | POA: Diagnosis not present

## 2019-02-09 DIAGNOSIS — D229 Melanocytic nevi, unspecified: Secondary | ICD-10-CM | POA: Diagnosis not present

## 2019-02-09 DIAGNOSIS — L812 Freckles: Secondary | ICD-10-CM | POA: Diagnosis not present

## 2019-02-10 ENCOUNTER — Other Ambulatory Visit: Payer: Self-pay

## 2019-02-10 ENCOUNTER — Encounter: Payer: Self-pay | Admitting: Cardiology

## 2019-02-10 ENCOUNTER — Ambulatory Visit: Payer: Medicare HMO | Admitting: Cardiology

## 2019-02-10 VITALS — BP 146/90 | HR 66 | Ht 71.0 in | Wt 222.4 lb

## 2019-02-10 DIAGNOSIS — I4891 Unspecified atrial fibrillation: Secondary | ICD-10-CM | POA: Diagnosis not present

## 2019-02-10 DIAGNOSIS — I10 Essential (primary) hypertension: Secondary | ICD-10-CM | POA: Diagnosis not present

## 2019-02-10 DIAGNOSIS — E782 Mixed hyperlipidemia: Secondary | ICD-10-CM

## 2019-02-10 MED ORDER — VALSARTAN-HYDROCHLOROTHIAZIDE 320-25 MG PO TABS: 1.0000 | ORAL_TABLET | Freq: Every day | ORAL | 0 refills | Status: DC

## 2019-02-10 MED ORDER — VALSARTAN-HYDROCHLOROTHIAZIDE 320-25 MG PO TABS: 1.0000 | ORAL_TABLET | Freq: Every day | ORAL | 3 refills | Status: DC

## 2019-02-10 NOTE — Progress Notes (Signed)
Cardiology Office Note:    Date:  02/10/2019   ID:  Eugene White, DOB 1946-06-26, MRN [id number]  PCP:  Eugene Common, PA  Cardiologist:  Eugene Dawley, MD  Referring MD: Eugene White, Eugene White   Chief Complaint  Patient presents with  . Atrial Fibrillation    History of Present Illness:    Eugene White is a 73 y.o. male with a past medical history significant for PAF on Eliquis, s/p cardioversion 01/26/19,  HTN with white coat syndrome, HLD.  [name]  seems to have afib episodes in setting of stress (surgery, loss of his dog). He complains of fatigue when he is in afib. He underwent DCCV on 01/26/19. Rate controlled on metoprolol 50 mg BID.   He called the office on 02/08/19 complaining of fatigue associated with going in and out of afib. He is here today with his wife. He feels like he has been in and out of afib. He feels slightly winded at times just walking across his house but other times he's fine. He has not been very active lately and thinks he is quite deconditioned. He has had 3 orthopedic surgeries over the last year. He broke his ankle a little over a week ago and his foot is in a boot.  He denies chest pain/pressure. No orthopnea, PND, edema, lightheadedness or syncope. He says that although he feels palpitations and suspects that he goes into brief episodes of A. fib, he does not feel particularly symptomatic.  His wife was concerned whether it was safe for him to be in and out of afib. I explained that as long as the rate is controlled and he does not have symptoms, he can live with afib. He is protected with eliquis for stroke risk reduction.   Home BP's 140's-150's/80's-90's.   Pt is a Marketing executive and very knowledgeable.    Past Medical History:  Diagnosis Date  . A-fib ([place])   . 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 Anemia    hx of one time  . Arthritis    OA  . Atrial fibrillation with controlled ventricular rate ([place]) 10/05/2017  . Benign essential  HTN 05/31/2009  . Closed intertrochanteric fracture of hip, left, initial encounter ([place]) 10/10/2016  . Colon, diverticulosis 05/31/2009  . Difficulty sleeping   . Diverticulitis large intestine 05/31/2009  . Diverticulosis 2014  . Dysrhythmia    atrial fib   . Essential (primary) hypertension 05/31/2009  . 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 GERD (gastroesophageal reflux disease)   . GI symptom    had nausea / vomited once / frequent stools / getting better  . Hyperlipidemia   . Hypertension   . Hypothyroidism   . Mood changes   . Pneumonia    hx of  . Thyroid disease    hypo    Past Surgical History:  Procedure Laterality Date  . CARDIOVERSION N/A 01/12/2018   Procedure: CARDIOVERSION;  Surgeon: Dorothy Spark, MD;  Location[facility] ENDOSCOPY;  Service: Cardiovascular;  Laterality: N/A;  . CARDIOVERSION N/A 01/26/2019   Procedure: CARDIOVERSION;  Surgeon: Jerline Pain, MD;  Location: [facility] ENDOSCOPY;  Service: Cardiovascular;  Laterality: N/A;  . COLONOSCOPY  2014  . FEMUR IM NAIL Left 10/11/2016   Procedure: INTRAMEDULLARY (IM) NAIL FEMORAL;  Surgeon: [name], MD;  Location: [facility] ORS;  Service: Orthopedics;  Laterality: Left;  . HAND SURGERY   1973 / 2010 / 2015  right to release tendons  . HARDWARE REMOVAL Left 10/05/2017   Procedure: Removal of left femoral nail;  Surgeon: [name], MD;  Location: [facility] ORS;  Service: Orthopedics;  Laterality: Left;  90 mins  . HEMATOMA EVACUATION Left 01/19/2018   Procedure: Irrigation and debridement, Evacuation of left total knee hematoma;  Surgeon: [name], MD;  Location: [facility] ORS;  Service: Orthopedics;  Laterality: Left;  . KNEE ARTHROSCOPY  1984 & 2001   twice  . NASAL SEPTUM SURGERY    . removal Left femoral nail      10/05/17 Dr. [name]  . [place] & 2006   Right and left  . TEE WITH CARDIOVERSION  01/12/2018  . TEE WITHOUT CARDIOVERSION N/A 01/12/2018   Procedure:  TRANSESOPHAGEAL ECHOCARDIOGRAM (TEE);  Surgeon: Dorothy Spark, MD;  Location: [facility] ENDOSCOPY;  Service: Cardiovascular;  Laterality: N/A;  . TONSILLECTOMY    . TOTAL KNEE ARTHROPLASTY Right 01/09/2015   Procedure: RIGHT TOTAL KNEE ARTHROPLASTY;  Surgeon: [name], MD;  Location: [facility] ORS;  Service: Orthopedics;  Laterality: Right;  . TOTAL KNEE ARTHROPLASTY Left 11/23/2017   Procedure: LEFT TOTAL KNEE ARTHROPLASTY;  Surgeon: [name], MD;  Location: [facility] ORS;  Service: Orthopedics;  Laterality: Left;  90 mins    Current Medications: Current Meds  Medication Sig  . acetaminophen (TYLENOL) 500 MG tablet Take 1,000 mg by mouth daily as needed for moderate pain.  Marland Kitchen amLODipine (NORVASC) 5 MG tablet Take 5 mg by mouth daily.  Marland Kitchen apixaban (ELIQUIS) 5 MG TABS tablet Take 1 tablet (5 mg total) by mouth 2 (two) times daily.  Marland Kitchen atorvastatin (LIPITOR) 20 MG tablet TAKE 1 TABLET EVERY DAY (Patient taking differently: Take 20 mg by mouth every evening. )  . Cholecalciferol (VITAMIN D) 2000 UNITS CAPS Take 2,000 Units by mouth every evening.   Marland Kitchen levothyroxine (SYNTHROID, LEVOTHROID) 150 MCG tablet TAKE 1 TABLET EVERY DAY  . metoprolol tartrate (LOPRESSOR) 50 MG tablet TAKE 1 TABLET TWICE DAILY  . Multiple Vitamin (MULTIVITAMIN WITH MINERALS) TABS tablet Take 1 tablet by mouth daily.   . Multiple Vitamins-Minerals (PRESERVISION AREDS 2) CAPS Take 2 capsules by mouth daily.   . pantoprazole (PROTONIX) 40 MG tablet TAKE 1 TABLET EVERY DAY  . polycarbophil (FIBERCON) 625 MG tablet Take 625 mg by mouth daily.   Marland Kitchen venlafaxine XR (EFFEXOR-XR) 75 MG 24 hr capsule TAKE 1 CAPSULE EVERY DAY  . [DISCONTINUED] valsartan-hydrochlorothiazide (DIOVAN-HCT) 160-25 MG tablet TAKE 1 TABLET EVERY DAY     Allergies:   Sulfa antibiotics   Social History   Socioeconomic History  . Marital status: Married    Spouse name: Not on file  . Number of children: 2  . Years of education: Not on file  . Highest education  level: Professional school degree (e.g., MD, DDS, DVM, JD)  Occupational History  . Occupation: retired, has degree in physiology  Social Needs  . Financial resource strain: Not hard at all  . Food insecurity:    Worry: Never true    Inability: Never true  . Transportation needs:    Medical: No    Non-medical: No  Tobacco Use  . Smoking status: Former Smoker    Last attempt to quit: 12/29/1968    Years since quitting: 50.1  . Smokeless tobacco: Never Used  Substance and Sexual Activity  . Alcohol use: Yes    Alcohol/week: 12.0 standard drinks    Types: 12 Cans of beer per week  Comment: weekly  . Drug use: No  . Sexual activity: Yes  Lifestyle  . Physical activity:    Days per week: Not on file    Minutes per session: Not on file  . Stress: Not at all  Relationships  . Social connections:    Talks on phone: Patient refused    Gets together: Patient refused    Attends religious service: Patient refused    Active member of club or organization: Patient refused    Attends meetings of clubs or organizations: Patient refused    Relationship status: Patient refused  Other Topics Concern  . Not on file  Social History Narrative   Pt lives in [place] w/ wife.     Family History: The patient's family history includes Breast cancer in his mother. There is no history of Colon cancer, Esophageal cancer, Stomach cancer, or Rectal cancer. ROS:   Please see the history of present illness.     All other systems reviewed and are negative.  EKGs/Labs/Other Studies Reviewed:    The following studies were reviewed today:  Echo TEE 01/12/2018 Study Conclusions  - Left ventricle: Systolic function was normal. The estimated ejection fraction was in the range of 55% to 60%. Wall motion was normal; there were no regional wall motion abnormalities. - Aortic valve: There was mild regurgitation. - Mitral valve: There was moderate regurgitation. - Left atrium: The atrium was  dilated. No evidence of thrombus in the atrial cavity or appendage. No evidence of thrombus in the atrial cavity or appendage. No evidence of thrombus in the atrial cavity or appendage. - Right atrium: The atrium was dilated. No evidence of thrombus in the atrial cavity or appendage. - Atrial septum: No defect or patent foramen ovale was identified. - Tricuspid valve: There was moderate regurgitation.  Impressions: - No cardiac source of emboli was indentified. The study was followed by a successful cardioversion.   EKG:  EKG is ordered today.  The ekg ordered today demonstrates sinus rhythm with PACs, 66 bpm.  Possible Q waves in V1, V2- not new  Recent Labs: 10/11/2018: ALT 60; TSH 2.110 01/17/2019: BUN 13; Creatinine, Ser 0.72; Hemoglobin 15.3; Platelets 249; Potassium 3.7; Sodium 136   Recent Lipid Panel    Component Value Date/Time   CHOL 201 (H) 10/11/2018 1207   TRIG 153 (H) 10/11/2018 1207   HDL 63 10/11/2018 1207   CHOLHDL 3.2 10/11/2018 1207   CHOLHDL 3.8 10/11/2016 0337   VLDL UNABLE TO CALCULATE IF TRIGLYCERIDE OVER 400 mg/dL 10/11/2016 0337   LDLCALC 107 (H) 10/11/2018 1207    Physical Exam:    VS:  BP (!) 146/90   Pulse 66   Ht 5\' 11"  (1.803 m)   Wt 222 lb 6.4 oz (100.9 kg)   SpO2 97%   BMI 31.02 kg/m     Wt Readings from Last 3 Encounters:  02/10/19 222 lb 6.4 oz (100.9 kg)  01/17/19 221 lb 12.8 oz (100.6 kg)  12/14/18 220 lb 12.8 oz (100.2 kg)     Physical Exam  Constitutional: He is oriented to person, place, and time. He appears well-developed and well-nourished. No distress.  HENT:  Old scar from over eye to below mouth  Neck: Normal range of motion. Neck supple. No JVD present.  Cardiovascular: Normal rate, normal heart sounds and intact distal pulses. An irregularly irregular rhythm present. Exam reveals no gallop and no friction rub.  No murmur heard. Pulmonary/Chest: Effort normal and breath sounds normal. No respiratory  distress. He has no wheezes. He has no rales.  Abdominal: Soft. Bowel sounds are normal.  Musculoskeletal: Normal range of motion.        General: No edema.     Comments: Left lower leg in orthopedic boot  Neurological: He is alert and oriented to person, place, and time.  Skin: Skin is warm and dry.  Psychiatric: He has a normal mood and affect. His behavior is normal. Judgment and thought content normal.  Vitals reviewed.   ASSESSMENT:    1. Atrial fibrillation with controlled ventricular rate ([place])   2. Essential hypertension   3. Mixed hyperlipidemia    PLAN:    In order of problems listed above:  Paroxysmal atrial fibrillation -Pt seems to have afib episodes in setting of stress (surgery, loss of his dog). He broke his ankle last week and feels like he is having brief episodes of afib. Other than feeling palpitations, he is asymptomatic.  -Rate controlled on metoprolol 50 mg BID. Rate is 66 today and I do not think we could increase  The BB dose.  -We discussed the use of dronederone to keep him from having episodes of afib but he does not wish to add any medications at this time. We also discussed getting the [place] APP for his smart phone so that he could check his rhythm if he feels palpitations. Today he feels irregular heart beat but but EKG shows SR with PAC's.  -I reassured his wife that being in and out of afib is OK. We discussed what to report, ie rapid rate and/or symptomatic.  -CHA2DS2/VAS Stroke Risk Sore is 2 (HTN, Age). He is on Eliquis for stroke risk reduction. No usual bleeding.    Hypertension -On amlodipine 5 mg bid, valsartan-HCTZ 160-25 mg -BP runs high. Will increase valsartan. Renal function was normal on recent labs. -He has follow up with Dr. [name] next month prior to going on a cruise and his blood pressure can be rechecked at that time along with labs for renal function and potassium.  Hyperlipidemia -On atorvastatin 20 mg   Medication  Adjustments/Labs and Tests Ordered: Current medicines are reviewed at length with the patient today.  Concerns regarding medicines are outlined above. Labs and tests ordered and medication changes are outlined in the patient instructions below:  Patient Instructions  Medication Instructions:  1.) INCREASE: Valsartan- Hydrochlorothiazide to 320-25 mg taking 1 tablet daily  If you need a refill on your cardiac medications before your next appointment, please call your pharmacy.   Lab work: None  If you have labs (blood work) drawn today and your tests are completely normal, you will receive your results only by: Marland Kitchen MyChart Message (if you have MyChart) OR . A paper copy in the mail If you have any lab test that is abnormal or we need to change your treatment, we will call you to review the results.  Testing/Procedures: None  Follow-Up: Keep follow up appointment with Dr. [name] on 03/25/2019 @ 1:20 PM  Any Other Special Instructions Will Be Listed Below (If Applicable).   DASH Eating Plan DASH stands for "Dietary Approaches to Stop Hypertension." The DASH eating plan is a healthy eating plan that has been shown to reduce high blood pressure (hypertension). It may also reduce your risk for type 2 diabetes, heart disease, and stroke. The DASH eating plan may also help with weight loss. What are tips for following this plan?  General guidelines  Avoid eating more than 2,300 mg (milligrams) of  salt (sodium) a day. If you have hypertension, you may need to reduce your sodium intake to 1,500 mg a day.  Limit alcohol intake to no more than 1 drink a day for nonpregnant women and 2 drinks a day for men. One drink equals 12 oz of beer, 5 oz of wine, or 1 oz of hard liquor.  Work with your health care provider to maintain a healthy body weight or to lose weight. Ask what an ideal weight is for you.  Get at least 30 minutes of exercise that causes your heart to beat faster (aerobic exercise)  most days of the week. Activities may include walking, swimming, or biking.  Work with your health care provider or diet and nutrition specialist (dietitian) to adjust your eating plan to your individual calorie needs. Reading food labels   Check food labels for the amount of sodium per serving. Choose foods with less than 5 percent of the Daily Value of sodium. Generally, foods with less than 300 mg of sodium per serving fit into this eating plan.  To find whole grains, look for the word "whole" as the first word in the ingredient list. Shopping  Buy products labeled as "low-sodium" or "no salt added."  Buy fresh foods. Avoid canned foods and premade or frozen meals. Cooking  Avoid adding salt when cooking. Use salt-free seasonings or herbs instead of table salt or sea salt. Check with your health care provider or pharmacist before using salt substitutes.  Do not fry foods. Cook foods using healthy methods such as baking, boiling, grilling, and broiling instead.  Cook with heart-healthy oils, such as olive, canola, soybean, or sunflower oil. Meal planning  Eat a balanced diet that includes: ? 5 or more servings of fruits and vegetables each day. At each meal, try to fill half of your plate with fruits and vegetables. ? Up to 6-8 servings of whole grains each day. ? Less than 6 oz of lean meat, poultry, or fish each day. A 3-oz serving of meat is about the same size as a deck of cards. One egg equals 1 oz. ? 2 servings of low-fat dairy each day. ? A serving of nuts, seeds, or beans 5 times each week. ? Heart-healthy fats. Healthy fats called Omega-3 fatty acids are found in foods such as flaxseeds and coldwater fish, like sardines, salmon, and mackerel.  Limit how much you eat of the following: ? Canned or prepackaged foods. ? Food that is high in trans fat, such as fried foods. ? Food that is high in saturated fat, such as fatty meat. ? Sweets, desserts, sugary drinks, and other  foods with added sugar. ? Full-fat dairy products.  Do not salt foods before eating.  Try to eat at least 2 vegetarian meals each week.  Eat more home-cooked food and less restaurant, buffet, and fast food.  When eating at a restaurant, ask that your food be prepared with less salt or no salt, if possible. What foods are recommended? The items listed may not be a complete list. Talk with your dietitian about what dietary choices are best for you. Grains Whole-grain or whole-wheat bread. Whole-grain or whole-wheat pasta. Brown rice. [place]. Bulgur. Whole-grain and low-sodium cereals. Pita bread. Low-fat, low-sodium crackers. Whole-wheat flour tortillas. Vegetables Fresh or frozen vegetables (raw, steamed, roasted, or grilled). Low-sodium or reduced-sodium tomato and vegetable juice. Low-sodium or reduced-sodium tomato sauce and tomato paste. Low-sodium or reduced-sodium canned vegetables. Fruits All fresh, dried, or frozen fruit. Canned fruit  in natural juice (without added sugar). Meat and other protein foods Skinless chicken or [place]. Ground chicken or [place]. Pork with fat trimmed off. Fish and seafood. Egg whites. Dried beans, peas, or lentils. Unsalted nuts, nut butters, and seeds. Unsalted canned beans. Lean cuts of beef with fat trimmed off. Low-sodium, lean deli meat. Dairy Low-fat (1%) or fat-free (skim) milk. Fat-free, low-fat, or reduced-fat cheeses. Nonfat, low-sodium ricotta or cottage cheese. Low-fat or nonfat yogurt. Low-fat, low-sodium cheese. Fats and oils Soft margarine without trans fats. Vegetable oil. Low-fat, reduced-fat, or light mayonnaise and salad dressings (reduced-sodium). Canola, safflower, olive, soybean, and sunflower oils. Avocado. Seasoning and other foods Herbs. Spices. Seasoning mixes without salt. Unsalted popcorn and pretzels. Fat-free sweets. What foods are not recommended? The items listed may not be a complete list. Talk with your dietitian  about what dietary choices are best for you. Grains Baked goods made with fat, such as croissants, muffins, or some breads. Dry pasta or rice meal packs. Vegetables Creamed or fried vegetables. Vegetables in a cheese sauce. Regular canned vegetables (not low-sodium or reduced-sodium). Regular canned tomato sauce and paste (not low-sodium or reduced-sodium). Regular tomato and vegetable juice (not low-sodium or reduced-sodium). Angie Fava. Olives. Fruits Canned fruit in a light or heavy syrup. Fried fruit. Fruit in cream or butter sauce. Meat and other protein foods Fatty cuts of meat. Ribs. Fried meat. Berniece Salines. Sausage. Bologna and other processed lunch meats. Salami. Fatback. Hotdogs. Bratwurst. Salted nuts and seeds. Canned beans with added salt. Canned or smoked fish. Whole eggs or egg yolks. Chicken or [place] with skin. Dairy Whole or 2% milk, cream, and half-and-half. Whole or full-fat cream cheese. Whole-fat or sweetened yogurt. Full-fat cheese. Nondairy creamers. Whipped toppings. Processed cheese and cheese spreads. Fats and oils Butter. Stick margarine. Lard. Shortening. Ghee. Bacon fat. Tropical oils, such as coconut, palm kernel, or palm oil. Seasoning and other foods Salted popcorn and pretzels. Onion salt, garlic salt, seasoned salt, table salt, and sea salt. Worcestershire sauce. Tartar sauce. Barbecue sauce. Teriyaki sauce. Soy sauce, including reduced-sodium. Steak sauce. Canned and packaged gravies. Fish sauce. Oyster sauce. Cocktail sauce. Horseradish that you find on the shelf. Ketchup. Mustard. Meat flavorings and tenderizers. Bouillon cubes. Hot sauce and Tabasco sauce. Premade or packaged marinades. Premade or packaged taco seasonings. Relishes. Regular salad dressings. Where to find more information:  National Heart, Lung, and [place]: https://wilson-eaton.com/  American Heart Association: www.heart.org Summary  The DASH eating plan is a healthy eating plan that has been shown  to reduce high blood pressure (hypertension). It may also reduce your risk for type 2 diabetes, heart disease, and stroke.  With the DASH eating plan, you should limit salt (sodium) intake to 2,300 mg a day. If you have hypertension, you may need to reduce your sodium intake to 1,500 mg a day.  When on the DASH eating plan, aim to eat more fresh fruits and vegetables, whole grains, lean proteins, low-fat dairy, and heart-healthy fats.  Work with your health care provider or diet and nutrition specialist (dietitian) to adjust your eating plan to your individual calorie needs. This information is not intended to replace advice given to you by your health care provider. Make sure you discuss any questions you have with your health care provider. Document Released: 12/04/2011 Document Revised: 12/08/2016 Document Reviewed: 12/08/2016 Elsevier Interactive Patient Education  2019 Johnson, Daune Perch, NP  02/10/2019 12:39 PM    [place]

## 2019-02-10 NOTE — Patient Instructions (Addendum)
Medication Instructions:  1.) INCREASE: Valsartan- Hydrochlorothiazide to 320-25 mg taking 1 tablet daily  If you need a refill on your cardiac medications before your next appointment, please call your pharmacy.   Lab work: None  If you have labs (blood work) drawn today and your tests are completely normal, you will receive your results only by: Marland Kitchen MyChart Message (if you have MyChart) OR . A paper copy in the mail If you have any lab test that is abnormal or we need to change your treatment, we will call you to review the results.  Testing/Procedures: None  Follow-Up: Keep follow up appointment with Dr. [name] on 03/25/2019 @ 1:20 PM  Any Other Special Instructions Will Be Listed Below (If Applicable).   DASH Eating Plan DASH stands for "Dietary Approaches to Stop Hypertension." The DASH eating plan is a healthy eating plan that has been shown to reduce high blood pressure (hypertension). It may also reduce your risk for type 2 diabetes, heart disease, and stroke. The DASH eating plan may also help with weight loss. What are tips for following this plan?  General guidelines  Avoid eating more than 2,300 mg (milligrams) of salt (sodium) a day. If you have hypertension, you may need to reduce your sodium intake to 1,500 mg a day.  Limit alcohol intake to no more than 1 drink a day for nonpregnant women and 2 drinks a day for men. One drink equals 12 oz of beer, 5 oz of wine, or 1 oz of hard liquor.  Work with your health care provider to maintain a healthy body weight or to lose weight. Ask what an ideal weight is for you.  Get at least 30 minutes of exercise that causes your heart to beat faster (aerobic exercise) most days of the week. Activities may include walking, swimming, or biking.  Work with your health care provider or diet and nutrition specialist (dietitian) to adjust your eating plan to your individual calorie needs. Reading food labels   Check food labels for the  amount of sodium per serving. Choose foods with less than 5 percent of the Daily Value of sodium. Generally, foods with less than 300 mg of sodium per serving fit into this eating plan.  To find whole grains, look for the word "whole" as the first word in the ingredient list. Shopping  Buy products labeled as "low-sodium" or "no salt added."  Buy fresh foods. Avoid canned foods and premade or frozen meals. Cooking  Avoid adding salt when cooking. Use salt-free seasonings or herbs instead of table salt or sea salt. Check with your health care provider or pharmacist before using salt substitutes.  Do not fry foods. Cook foods using healthy methods such as baking, boiling, grilling, and broiling instead.  Cook with heart-healthy oils, such as olive, canola, soybean, or sunflower oil. Meal planning  Eat a balanced diet that includes: ? 5 or more servings of fruits and vegetables each day. At each meal, try to fill half of your plate with fruits and vegetables. ? Up to 6-8 servings of whole grains each day. ? Less than 6 oz of lean meat, poultry, or fish each day. A 3-oz serving of meat is about the same size as a deck of cards. One egg equals 1 oz. ? 2 servings of low-fat dairy each day. ? A serving of nuts, seeds, or beans 5 times each week. ? Heart-healthy fats. Healthy fats called Omega-3 fatty acids are found in foods such as flaxseeds and  coldwater fish, like sardines, salmon, and mackerel.  Limit how much you eat of the following: ? Canned or prepackaged foods. ? Food that is high in trans fat, such as fried foods. ? Food that is high in saturated fat, such as fatty meat. ? Sweets, desserts, sugary drinks, and other foods with added sugar. ? Full-fat dairy products.  Do not salt foods before eating.  Try to eat at least 2 vegetarian meals each week.  Eat more home-cooked food and less restaurant, buffet, and fast food.  When eating at a restaurant, ask that your food be  prepared with less salt or no salt, if possible. What foods are recommended? The items listed may not be a complete list. Talk with your dietitian about what dietary choices are best for you. Grains Whole-grain or whole-wheat bread. Whole-grain or whole-wheat pasta. Brown rice. Modena Morrow. Bulgur. Whole-grain and low-sodium cereals. Pita bread. Low-fat, low-sodium crackers. Whole-wheat flour tortillas. Vegetables Fresh or frozen vegetables (raw, steamed, roasted, or grilled). Low-sodium or reduced-sodium tomato and vegetable juice. Low-sodium or reduced-sodium tomato sauce and tomato paste. Low-sodium or reduced-sodium canned vegetables. Fruits All fresh, dried, or frozen fruit. Canned fruit in natural juice (without added sugar). Meat and other protein foods Skinless chicken or [place]. Ground chicken or [place]. Pork with fat trimmed off. Fish and seafood. Egg whites. Dried beans, peas, or lentils. Unsalted nuts, nut butters, and seeds. Unsalted canned beans. Lean cuts of beef with fat trimmed off. Low-sodium, lean deli meat. Dairy Low-fat (1%) or fat-free (skim) milk. Fat-free, low-fat, or reduced-fat cheeses. Nonfat, low-sodium ricotta or cottage cheese. Low-fat or nonfat yogurt. Low-fat, low-sodium cheese. Fats and oils Soft margarine without trans fats. Vegetable oil. Low-fat, reduced-fat, or light mayonnaise and salad dressings (reduced-sodium). Canola, safflower, olive, soybean, and sunflower oils. Avocado. Seasoning and other foods Herbs. Spices. Seasoning mixes without salt. Unsalted popcorn and pretzels. Fat-free sweets. What foods are not recommended? The items listed may not be a complete list. Talk with your dietitian about what dietary choices are best for you. Grains Baked goods made with fat, such as croissants, muffins, or some breads. Dry pasta or rice meal packs. Vegetables Creamed or fried vegetables. Vegetables in a cheese sauce. Regular canned vegetables (not  low-sodium or reduced-sodium). Regular canned tomato sauce and paste (not low-sodium or reduced-sodium). Regular tomato and vegetable juice (not low-sodium or reduced-sodium). Angie Fava. Olives. Fruits Canned fruit in a light or heavy syrup. Fried fruit. Fruit in cream or butter sauce. Meat and other protein foods Fatty cuts of meat. Ribs. Fried meat. Berniece Salines. Sausage. Bologna and other processed lunch meats. Salami. Fatback. Hotdogs. Bratwurst. Salted nuts and seeds. Canned beans with added salt. Canned or smoked fish. Whole eggs or egg yolks. Chicken or [place] with skin. Dairy Whole or 2% milk, cream, and half-and-half. Whole or full-fat cream cheese. Whole-fat or sweetened yogurt. Full-fat cheese. Nondairy creamers. Whipped toppings. Processed cheese and cheese spreads. Fats and oils Butter. Stick margarine. Lard. Shortening. Ghee. Bacon fat. Tropical oils, such as coconut, palm kernel, or palm oil. Seasoning and other foods Salted popcorn and pretzels. Onion salt, garlic salt, seasoned salt, table salt, and sea salt. Worcestershire sauce. Tartar sauce. Barbecue sauce. Teriyaki sauce. Soy sauce, including reduced-sodium. Steak sauce. Canned and packaged gravies. Fish sauce. Oyster sauce. Cocktail sauce. Horseradish that you find on the shelf. Ketchup. Mustard. Meat flavorings and tenderizers. Bouillon cubes. Hot sauce and Tabasco sauce. Premade or packaged marinades. Premade or packaged taco seasonings. Relishes. Regular salad dressings. Where to find  more information:  National Heart, Lung, and Blood Institute: https://wilson-eaton.com/  American Heart Association: www.heart.org Summary  The DASH eating plan is a healthy eating plan that has been shown to reduce high blood pressure (hypertension). It may also reduce your risk for type 2 diabetes, heart disease, and stroke.  With the DASH eating plan, you should limit salt (sodium) intake to 2,300 mg a day. If you have hypertension, you may need to reduce  your sodium intake to 1,500 mg a day.  When on the DASH eating plan, aim to eat more fresh fruits and vegetables, whole grains, lean proteins, low-fat dairy, and heart-healthy fats.  Work with your health care provider or diet and nutrition specialist (dietitian) to adjust your eating plan to your individual calorie needs. This information is not intended to replace advice given to you by your health care provider. Make sure you discuss any questions you have with your health care provider. Document Released: 12/04/2011 Document Revised: 12/08/2016 Document Reviewed: 12/08/2016 Elsevier Interactive Patient Education  2019 [employer].

## 2019-02-11 ENCOUNTER — Other Ambulatory Visit: Payer: Self-pay | Admitting: Family Medicine

## 2019-02-14 DIAGNOSIS — M25512 Pain in left shoulder: Secondary | ICD-10-CM | POA: Diagnosis not present

## 2019-02-14 DIAGNOSIS — M7542 Impingement syndrome of left shoulder: Secondary | ICD-10-CM | POA: Insufficient documentation

## 2019-02-17 DIAGNOSIS — S8265XA Nondisplaced fracture of lateral malleolus of left fibula, initial encounter for closed fracture: Secondary | ICD-10-CM | POA: Diagnosis not present

## 2019-03-04 ENCOUNTER — Other Ambulatory Visit: Payer: Self-pay | Admitting: Cardiology

## 2019-03-10 ENCOUNTER — Other Ambulatory Visit: Payer: Self-pay | Admitting: Cardiology

## 2019-03-10 ENCOUNTER — Other Ambulatory Visit: Payer: Self-pay | Admitting: Family Medicine

## 2019-03-17 ENCOUNTER — Telehealth: Payer: Self-pay | Admitting: Cardiology

## 2019-03-17 DIAGNOSIS — S8265XA Nondisplaced fracture of lateral malleolus of left fibula, initial encounter for closed fracture: Secondary | ICD-10-CM | POA: Diagnosis not present

## 2019-03-17 NOTE — Telephone Encounter (Signed)
   Primary Cardiologist:  Ena Dawley, MD   Patient contacted.  History reviewed.  No symptoms to suggest any unstable cardiac conditions.    The patient feels well with occasional palpitations, otherwise no cardiac symptoms.  His blood pressure is now controlled.  Based on discussion, with current pandemic situation, we will be postponing this appointment for >12 wks.  Ena Dawley, MD  03/17/2019 3:44 PM         .

## 2019-03-25 ENCOUNTER — Ambulatory Visit: Payer: Self-pay | Admitting: Cardiology

## 2019-04-15 DIAGNOSIS — S8265XA Nondisplaced fracture of lateral malleolus of left fibula, initial encounter for closed fracture: Secondary | ICD-10-CM | POA: Diagnosis not present

## 2019-04-18 NOTE — Telephone Encounter (Signed)
Virtual Visit Pre-Appointment Phone Call  TELEPHONE CALL NOTE  RAIFORD FETTERMAN has been deemed a candidate for a follow-up tele-health visit to limit community exposure during the Covid-19 pandemic. I spoke with the patient via phone to ensure availability of phone/video source, confirm preferred email & phone number, and discuss instructions and expectations.  I reminded Eugene White to be prepared with any vital sign and/or heart rhythm information that could potentially be obtained via home monitoring, at the time of his visit. I reminded LENELL LAMA to expect a phone call at the time of his visit if his visit.  Patient agrees to consent below.  Cleon Gustin, RN 04/18/2019 1:55 PM    FULL LENGTH CONSENT FOR TELE-HEALTH VISIT   I hereby voluntarily request, consent and authorize [facility] and its employed or contracted physicians, physician assistants, nurse practitioners or other licensed health care professionals (the Practitioner), to provide me with telemedicine health care services (the "Services") as deemed necessary by the treating Practitioner. I acknowledge and consent to receive the Services by the Practitioner via telemedicine. I understand that the telemedicine visit will involve communicating with the Practitioner through live audiovisual communication technology and the disclosure of certain medical information by electronic transmission. I acknowledge that I have been given the opportunity to request an in-person assessment or other available alternative prior to the telemedicine visit and am voluntarily participating in the telemedicine visit.  I understand that I have the right to withhold or withdraw my consent to the use of telemedicine in the course of my care at any time, without affecting my right to future care or treatment, and that the Practitioner or I may terminate the telemedicine visit at any time. I understand that I have the right to inspect  all information obtained and/or recorded in the course of the telemedicine visit and may receive copies of available information for a reasonable fee.  I understand that some of the potential risks of receiving the Services via telemedicine include:  Marland Kitchen Delay or interruption in medical evaluation due to technological equipment failure or disruption; . Information transmitted may not be sufficient (e.g. poor resolution of images) to allow for appropriate medical decision making by the Practitioner; and/or  . In rare instances, security protocols could fail, causing a breach of personal health information.  Furthermore, I acknowledge that it is my responsibility to provide information about my medical history, conditions and care that is complete and accurate to the best of my ability. I acknowledge that Practitioner's advice, recommendations, and/or decision may be based on factors not within their control, such as incomplete or inaccurate data provided by me or distortions of diagnostic images or specimens that may result from electronic transmissions. I understand that the practice of medicine is not an exact science and that Practitioner makes no warranties or guarantees regarding treatment outcomes. I acknowledge that I will receive a copy of this consent concurrently upon execution via email to the email address I last provided but may also request a printed copy by calling the office of [facility].    I understand that my insurance will be billed for this visit.   I have read or had this consent read to me. . I understand the contents of this consent, which adequately explains the benefits and risks of the Services being provided via telemedicine.  . I have been provided ample opportunity to ask questions regarding this consent and the Services and have had my questions  answered to my satisfaction. . I give my informed consent for the services to be provided through the use of telemedicine in my  medical care  By participating in this telemedicine visit I agree to the above.

## 2019-04-18 NOTE — Progress Notes (Signed)
Virtual Visit via Video Note   This visit type was conducted due to national recommendations for restrictions regarding the COVID-19 Pandemic (e.g. social distancing) in an effort to limit this patient's exposure and mitigate transmission in our community.  Due to his co-morbid illnesses, this patient is at least at moderate risk for complications without adequate follow up.  This format is felt to be most appropriate for this patient at this time.  All issues noted in this document were discussed and addressed.  A limited physical exam was performed with this format.  Please refer to the patient's chart for his consent to telehealth for [facility].   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  Patient was last seen in our office 02/10/2019 at which time he felt like he was going in and out of atrial fibrillation.  Had broken his ankle and felt like he had brief episodes of A. fib which usually happens when he is under stress.  Rate was controlled with metoprolol.  dronederone was discussed but patient did not want to add any medications.  She also is just in the cardia app for his smart phone so he could check his rhythm if he has palpitations.  He was feeling his heart beat to be irregular at the office visit but he was in normal sinus rhythm with PACs.  Blood pressure was running high so valsartan was increased.  Patient says he occasionally has trigeminy but usually NSR. BP high and he thinks it's because he's not exercising. Is getting salt in potato chips.  The patient does not  have symptoms concerning for COVID-19 infection (fever, chills, cough, or new shortness of breath).    Past Medical History:  Diagnosis Date  . A-fib ([place])   . 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 Anemia    hx of one time  . Arthritis    OA  . Atrial fibrillation with controlled ventricular rate ([place]) 10/05/2017  . Benign essential HTN 05/31/2009  . Closed intertrochanteric fracture of hip, left, initial encounter ([place]) 10/10/2016  . Colon, diverticulosis 05/31/2009  . Difficulty sleeping   . Diverticulitis large intestine 05/31/2009  . Diverticulosis 2014  . Dysrhythmia    atrial fib   . Essential (primary) hypertension 05/31/2009  . 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 GERD (gastroesophageal reflux disease)   . GI symptom    had nausea / vomited once / frequent stools / getting better  . Hyperlipidemia   . Hypertension   . Hypothyroidism   . Mood changes   . Pneumonia    hx of  . Thyroid disease    hypo   Past Surgical History:  Procedure Laterality Date  . CARDIOVERSION N/A 01/12/2018   Procedure: CARDIOVERSION;  Surgeon: Dorothy Spark, MD;  Location: [facility] ENDOSCOPY;  Service: Cardiovascular;  Laterality: N/A;  . CARDIOVERSION N/A 01/26/2019   Procedure: CARDIOVERSION;  Surgeon: Jerline Pain, MD;  Location: [facility] ENDOSCOPY;  Service: Cardiovascular;  Laterality: N/A;  . COLONOSCOPY  2014  .  FEMUR IM NAIL Left 10/11/2016   Procedure: INTRAMEDULLARY (IM) NAIL FEMORAL;  Surgeon: [name], MD;  Location: [facility] ORS;  Service: Orthopedics;  Laterality: Left;  . HAND SURGERY   1973 / 2010 / 2015   right to release tendons  . HARDWARE REMOVAL Left 10/05/2017   Procedure: Removal of left femoral nail;  Surgeon: [name], MD;  Location: [facility] ORS;  Service: Orthopedics;  Laterality: Left;  90 mins  . HEMATOMA EVACUATION Left 01/19/2018   Procedure: Irrigation and debridement, Evacuation of left total knee hematoma;  Surgeon: [name], MD;  Location:  [facility] ORS;  Service: Orthopedics;  Laterality: Left;  . KNEE ARTHROSCOPY  1984 & 2001   twice  . NASAL SEPTUM SURGERY    . removal Left femoral nail      10/05/17 Dr. [name]  . [place] & 2006   Right and left  . TEE WITH CARDIOVERSION  01/12/2018  . TEE WITHOUT CARDIOVERSION N/A 01/12/2018   Procedure: TRANSESOPHAGEAL ECHOCARDIOGRAM (TEE);  Surgeon: Dorothy Spark, MD;  Location: [facility] ENDOSCOPY;  Service: Cardiovascular;  Laterality: N/A;  . TONSILLECTOMY    . TOTAL KNEE ARTHROPLASTY Right 01/09/2015   Procedure: RIGHT TOTAL KNEE ARTHROPLASTY;  Surgeon: [name], MD;  Location: [facility] ORS;  Service: Orthopedics;  Laterality: Right;  . TOTAL KNEE ARTHROPLASTY Left 11/23/2017   Procedure: LEFT TOTAL KNEE ARTHROPLASTY;  Surgeon: [name], MD;  Location: [facility] ORS;  Service: Orthopedics;  Laterality: Left;  90 mins     Current Meds  Medication Sig  . acetaminophen (TYLENOL) 500 MG tablet Take 1,000 mg by mouth daily as needed for moderate pain.  Marland Kitchen amLODipine (NORVASC) 10 MG tablet Take 1 tablet (10 mg total) by mouth daily.  Marland Kitchen apixaban (ELIQUIS) 5 MG TABS tablet Take 1 tablet (5 mg total) by mouth 2 (two) times daily.  Marland Kitchen atorvastatin (LIPITOR) 20 MG tablet Take 1 tablet (20 mg total) by mouth every evening.  . Cholecalciferol (VITAMIN D) 2000 UNITS CAPS Take 2,000 Units by mouth every evening.   Marland Kitchen levothyroxine (SYNTHROID, LEVOTHROID) 150 MCG tablet TAKE 1 TABLET EVERY DAY  . metoprolol tartrate (LOPRESSOR) 50 MG tablet TAKE 1 TABLET TWICE DAILY  . Multiple Vitamin (MULTIVITAMIN WITH MINERALS) TABS tablet Take 1 tablet by mouth daily.   . Multiple Vitamins-Minerals (PRESERVISION AREDS 2) CAPS Take 2 capsules by mouth daily.   . pantoprazole (PROTONIX) 40 MG tablet TAKE 1 TABLET EVERY DAY  . polycarbophil (FIBERCON) 625 MG tablet Take 625 mg by mouth daily.   . valsartan-hydrochlorothiazide (DIOVAN-HCT) 320-25 MG tablet Take 1 tablet by mouth daily.  Marland Kitchen  venlafaxine XR (EFFEXOR-XR) 75 MG 24 hr capsule TAKE 1 CAPSULE EVERY DAY  . [DISCONTINUED] amLODipine (NORVASC) 10 MG tablet Take 1 tablet (10 mg total) by mouth daily.  . [DISCONTINUED] amLODipine (NORVASC) 5 MG tablet Take 5 mg by mouth daily.     Allergies:   Sulfa antibiotics   Social History   Tobacco Use  . Smoking status: Former Smoker    Last attempt to quit: 12/29/1968    Years since quitting: 50.3  . Smokeless tobacco: Never Used  Substance Use Topics  . Alcohol use: Yes    Alcohol/week: 12.0 standard drinks    Types: 12 Cans of beer per week    Comment: weekly  . Drug use: No     Family Hx: The patient's family history includes Breast cancer in his mother. There is no  history of Colon cancer, Esophageal cancer, Stomach cancer, or Rectal cancer.  ROS:   Please see the history of present illness.    Review of Systems  Constitution: Negative.  HENT: Negative.   Cardiovascular: Negative.   Respiratory: Negative.   Endocrine: Negative.   Hematologic/Lymphatic: Negative.   Musculoskeletal: Negative.   Gastrointestinal: Negative.   Genitourinary: Negative.   Neurological: Negative.    All other systems reviewed and are negative.   Prior CV studies:   The following studies were reviewed today: TEE 1/2019Study Conclusions   - Left ventricle: Systolic function was normal. The estimated   ejection fraction was in the range of 55% to 60%. Wall motion was   normal; there were no regional wall motion abnormalities. - Aortic valve: There was mild regurgitation. - Mitral valve: There was moderate regurgitation. - Left atrium: The atrium was dilated. No evidence of thrombus in   the atrial cavity or appendage. No evidence of thrombus in the   atrial cavity or appendage. No evidence of thrombus in the atrial   cavity or appendage. - Right atrium: The atrium was dilated. No evidence of thrombus in   the atrial cavity or appendage. - Atrial septum: No defect or patent  foramen ovale was identified. - Tricuspid valve: There was moderate regurgitation.   Impressions:   - No cardiac source of emboli was indentified. The study was   followed by a successful cardioversion.     Labs/Other Tests and Data Reviewed:    EKG:  An ECG dated 02/10/2019 was personally reviewed today and demonstrated:  Normal sinus rhythm with PACs  Recent Labs: 10/11/2018: ALT 60; TSH 2.110 01/17/2019: BUN 13; Creatinine, Ser 0.72; Hemoglobin 15.3; Platelets 249; Potassium 3.7; Sodium 136   Recent Lipid Panel Lab Results  Component Value Date/Time   CHOL 201 (H) 10/11/2018 12:07 PM   TRIG 153 (H) 10/11/2018 12:07 PM   HDL 63 10/11/2018 12:07 PM   CHOLHDL 3.2 10/11/2018 12:07 PM   CHOLHDL 3.8 10/11/2016 03:37 AM   LDLCALC 107 (H) 10/11/2018 12:07 PM    Wt Readings from Last 3 Encounters:  04/19/19 220 lb (99.8 kg)  02/10/19 222 lb 6.4 oz (100.9 kg)  01/17/19 221 lb 12.8 oz (100.6 kg)     Objective:    Vital Signs:  BP (!) 174/86   Pulse 72   Ht 5\' 11"  (1.803 m)   Wt 220 lb (99.8 kg)   BMI 30.68 kg/m    VITAL SIGNS:  reviewed GEN:  no acute distress RESPIRATORY:  normal respiratory effort, symmetric expansion CARDIOVASCULAR:  no peripheral edema  ASSESSMENT & PLAN:    1. PAF on Eliquis and metoprolol chadsvasc equals -says he knows he's in NSR 2. Essential hypertension on amlodipine 5 mg daily valsartan HCTZ 320/25 mg-BP high today. Increase amlodipine 10 mg daily-call if leg swelling, 2 gm sodium diet, increase exercise.  3.   Hyperlipidemia on Lipitor 20 mg daily LDL 107 triglycerides 150 30 September 2018 COVID-19 Education: The signs and symptoms of COVID-19 were discussed with the patient and how to seek care for testing (follow up with PCP or arrange E-visit).  The importance of social distancing was discussed today.  Time:   Today, I have spent 15 minutes with the patient with telehealth technology discussing the above problems.     Medication  Adjustments/Labs and Tests Ordered: Current medicines are reviewed at length with the patient today.  Concerns regarding medicines are outlined above.   Tests Ordered: No  orders of the defined types were placed in this encounter.   Medication Changes: Meds ordered this encounter  Medications  . DISCONTD: amLODipine (NORVASC) 10 MG tablet    Sig: Take 1 tablet (10 mg total) by mouth daily.    Dispense:  90 tablet    Refill:  3  . amLODipine (NORVASC) 10 MG tablet    Sig: Take 1 tablet (10 mg total) by mouth daily.    Dispense:  90 tablet    Refill:  3    Disposition:  Follow up in 2 month(s) Dr. [name]  Signed, Ermalinda Barrios, PA-C  04/19/2019 2:34 PM    [place]

## 2019-04-19 ENCOUNTER — Other Ambulatory Visit: Payer: Self-pay

## 2019-04-19 ENCOUNTER — Telehealth (INDEPENDENT_AMBULATORY_CARE_PROVIDER_SITE_OTHER): Payer: Medicare HMO | Admitting: Physician Assistant

## 2019-04-19 ENCOUNTER — Encounter: Payer: Self-pay | Admitting: Physician Assistant

## 2019-04-19 VITALS — BP 174/86 | HR 72 | Ht 71.0 in | Wt 220.0 lb

## 2019-04-19 DIAGNOSIS — I10 Essential (primary) hypertension: Secondary | ICD-10-CM | POA: Diagnosis not present

## 2019-04-19 DIAGNOSIS — I480 Paroxysmal atrial fibrillation: Secondary | ICD-10-CM | POA: Diagnosis not present

## 2019-04-19 DIAGNOSIS — E785 Hyperlipidemia, unspecified: Secondary | ICD-10-CM

## 2019-04-19 MED ORDER — AMLODIPINE BESYLATE 10 MG PO TABS: 10.0000 mg | ORAL_TABLET | Freq: Every day | ORAL | 3 refills | Status: DC

## 2019-04-19 NOTE — Patient Instructions (Addendum)
Medication Instructions:  Your physician has recommended you make the following change in your medication:   INCREASE: amlodipine (norvasc) to 10 mg by mouth once a day  Lab work: None Ordered  If you have labs (blood work) drawn today and your tests are completely normal, you will receive your results only by: Marland Kitchen MyChart Message (if you have MyChart) OR . A paper copy in the mail If you have any lab test that is abnormal or we need to change your treatment, we will call you to review the results.  Testing/Procedures: None ordered  Follow-Up: . Follow up with Dr. [name] via VIDEO Visit in 2 months. Dr. Francesca Oman Nurse will contact you to arrange.  Any Other Special Instructions Will Be Listed Below (If Applicable).  1. Monitor your Blood Pressure Daily  2. Your provider recommends that you maintain 150 minutes per week of moderate aerobic activity.  3. Limit the amount of salt in your diet to less than 2,000 mg daily  Two Gram Sodium Diet 2000 mg  What is Sodium? Sodium is a mineral found naturally in many foods. The most significant source of sodium in the diet is table salt, which is about 40% sodium.  Processed, convenience, and preserved foods also contain a large amount of sodium.  The body needs only 500 mg of sodium daily to function,  A normal diet provides more than enough sodium even if you do not use salt.  Why Limit Sodium? A build up of sodium in the body can cause thirst, increased blood pressure, shortness of breath, and water retention.  Decreasing sodium in the diet can reduce edema and risk of heart attack or stroke associated with high blood pressure.  Keep in mind that there are many other factors involved in these health problems.  Heredity, obesity, lack of exercise, cigarette smoking, stress and what you eat all play a role.  General Guidelines:  Do not add salt at the table or in cooking.  One teaspoon of salt contains over 2 grams of sodium.  Read food  labels  Avoid processed and convenience foods  Ask your dietitian before eating any foods not dicussed in the menu planning guidelines  Consult your physician if you wish to use a salt substitute or a sodium containing medication such as antacids.  Limit milk and milk products to 16 oz (2 cups) per day.  Shopping Hints:  READ LABELS!! "Dietetic" does not necessarily mean low sodium.  Salt and other sodium ingredients are often added to foods during processing.   Menu Planning Guidelines Food Group Choose More Often Avoid  Beverages (see also the milk group All fruit juices, low-sodium, salt-free vegetables juices, low-sodium carbonated beverages Regular vegetable or tomato juices, commercially softened water used for drinking or cooking  Breads and Cereals Enriched white, wheat, rye and pumpernickel bread, hard rolls and dinner rolls; muffins, cornbread and waffles; most dry cereals, cooked cereal without added salt; unsalted crackers and breadsticks; low sodium or homemade bread crumbs Bread, rolls and crackers with salted tops; quick breads; instant hot cereals; pancakes; commercial bread stuffing; self-rising flower and biscuit mixes; regular bread crumbs or cracker crumbs  Desserts and Sweets Desserts and sweets mad with mild should be within allowance Instant pudding mixes and cake mixes  Fats Butter or margarine; vegetable oils; unsalted salad dressings, regular salad dressings limited to 1 Tbs; light, sour and heavy cream Regular salad dressings containing bacon fat, bacon bits, and salt pork; snack dips made with instant soup  mixes or processed cheese; salted nuts  Fruits Most fresh, frozen and canned fruits Fruits processed with salt or sodium-containing ingredient (some dried fruits are processed with sodium sulfites        Vegetables Fresh, frozen vegetables and low- sodium canned vegetables Regular canned vegetables, sauerkraut, pickled vegetables, and others prepared in  brine; frozen vegetables in sauces; vegetables seasoned with ham, bacon or salt pork  Condiments, Sauces, Miscellaneous  Salt substitute with physician's approval; pepper, herbs, spices; vinegar, lemon or lime juice; hot pepper sauce; garlic powder, onion powder, low sodium soy sauce (1 Tbs.); low sodium condiments (ketchup, chili sauce, mustard) in limited amounts (1 tsp.) fresh ground horseradish; unsalted tortilla chips, pretzels, potato chips, popcorn, salsa (1/4 cup) Any seasoning made with salt including garlic salt, celery salt, onion salt, and seasoned salt; sea salt, rock salt, kosher salt; meat tenderizers; monosodium glutamate; mustard, regular soy sauce, barbecue, sauce, chili sauce, teriyaki sauce, steak sauce, Worcestershire sauce, and most flavored vinegars; canned gravy and mixes; regular condiments; salted snack foods, olives, picles, relish, horseradish sauce, catsup   Food preparation: Try these seasonings Meats:    Pork Sage, onion Serve with applesauce  Chicken Poultry seasoning, thyme, parsley Serve with cranberry sauce  Lamb Curry powder, rosemary, garlic, thyme Serve with mint sauce or jelly  Veal Marjoram, basil Serve with current jelly, cranberry sauce  Beef Pepper, bay leaf Serve with dry mustard, unsalted chive butter  Fish [place] leaf, dill Serve with unsalted lemon butter, unsalted parsley butter  Vegetables:    Asparagus Lemon juice   Broccoli Lemon juice   Carrots Mustard dressing parsley, mint, nutmeg, glazed with unsalted butter and sugar   Green beans Marjoram, lemon juice, nutmeg,dill seed   Tomatoes Basil, marjoram, onion   Spice /blend for [employer]" 4 tsp ground thyme 1 tsp ground sage 3 tsp ground rosemary 4 tsp ground marjoram   Test your knowledge 1. A product that says "Salt Free" may still contain sodium. True or False 2. Garlic Powder and Hot Pepper Sauce an be used as alternative seasonings.True or False 3. Processed foods have more sodium than  fresh foods.  True or False 4. Canned Vegetables have less sodium than froze True or False  WAYS TO DECREASE YOUR SODIUM INTAKE 1. Avoid the use of added salt in cooking and at the table.  Table salt (and other prepared seasonings which contain salt) is probably one of the greatest sources of sodium in the diet.  Unsalted foods can gain flavor from the sweet, sour, and butter taste sensations of herbs and spices.  Instead of using salt for seasoning, try the following seasonings with the foods listed.  Remember: how you use them to enhance natural food flavors is limited only by your creativity... Allspice-Meat, fish, eggs, fruit, peas, red and yellow vegetables Almond Extract-Fruit baked goods Anise Seed-Sweet breads, fruit, carrots, beets, cottage cheese, cookies (tastes like licorice) Basil-Meat, fish, eggs, vegetables, rice, vegetables salads, soups, sauces Bay Leaf-Meat, fish, stews, poultry Burnet-Salad, vegetables (cucumber-like flavor) Caraway Seed-Bread, cookies, cottage cheese, meat, vegetables, cheese, rice Cardamon-Baked goods, fruit, soups Celery Powder or seed-Salads, salad dressings, sauces, meatloaf, soup, bread.Do not use  celery salt Chervil-Meats, salads, fish, eggs, vegetables, cottage cheese (parsley-like flavor) Chili Power-Meatloaf, chicken cheese, corn, eggplant, egg dishes Chives-Salads cottage cheese, egg dishes, soups, vegetables, sauces Cilantro-Salsa, casseroles Cinnamon-Baked goods, fruit, pork, lamb, chicken, carrots Cloves-Fruit, baked goods, fish, pot roast, green beans, beets, carrots Coriander-Pastry, cookies, meat, salads, cheese (lemon-orange flavor) Cumin-Meatloaf, fish,cheese, eggs, cabbage,fruit  pie (caraway flavor) [employer], fruit, eggs, fish, poultry, cottage cheese, vegetables Dill Seed-Meat, cottage cheese, poultry, vegetables, fish, salads, bread Fennel Seed-Bread, cookies, apples, pork, eggs, fish, beets, cabbage, cheese, Licorice-like  flavor Garlic-(buds or powder) Salads, meat, poultry, fish, bread, butter, vegetables, potatoes.Do not  use garlic salt Ginger-Fruit, vegetables, baked goods, meat, fish, poultry Horseradish Root-Meet, vegetables, butter Lemon Juice or Extract-Vegetables, fruit, tea, baked goods, fish salads Mace-Baked goods fruit, vegetables, fish, poultry (taste like nutmeg) Maple Extract-Syrups Marjoram-Meat, chicken, fish, vegetables, breads, green salads (taste like Sage) Mint-Tea, lamb, sherbet, vegetables, desserts, carrots, cabbage Mustard, Dry or Seed-Cheese, eggs, meats, vegetables, poultry Nutmeg-Baked goods, fruit, chicken, eggs, vegetables, desserts Onion Powder-Meat, fish, poultry, vegetables, cheese, eggs, bread, rice salads (Do not use   Onion salt) Orange Extract-Desserts, baked goods Oregano-Pasta, eggs, cheese, onions, pork, lamb, fish, chicken, vegetables, green salads Paprika-Meat, fish, poultry, eggs, cheese, vegetables Parsley Flakes-Butter, vegetables, meat fish, poultry, eggs, bread, salads (certain forms may   Contain sodium Pepper-Meat fish, poultry, vegetables, eggs Peppermint Extract-Desserts, baked goods Poppy Seed-Eggs, bread, cheese, fruit dressings, baked goods, noodles, vegetables, cottage  [employer], poultry, meat, fish, cauliflower, turnips,eggs bread Saffron-Rice, bread, veal, chicken, fish, eggs Sage-Meat, fish, poultry, onions, eggplant, tomateos, pork, stews Savory-Eggs, salads, poultry, meat, rice, vegetables, soups, pork Tarragon-Meat, poultry, fish, eggs, butter, vegetables (licorice-like flavor)  Thyme-Meat, poultry, fish, eggs, vegetables, (clover-like flavor), sauces, soups Tumeric-Salads, butter, eggs, fish, rice, vegetables (saffron-like flavor) Vanilla Extract-Baked goods, candy Vinegar-Salads, vegetables, meat marinades Walnut Extract-baked goods, candy  2. Choose your Foods Wisely   The following is a list  of foods to avoid which are high in sodium:  Meats-Avoid all smoked, canned, salt cured, dried and kosher meat and fish as well as Anchovies   [employer] meats:Bologna, Liverwurst, Pastrami Canned meat or fish  Marinated herring Caviar    Pepperoni Corned Beef   Pizza Dried chipped beef  Salami Frozen breaded fish or meat Salt pork Frankfurters or hot dogs  Sardines Gefilte fish   Sausage Ham (boiled ham, Proscuitto Smoked butt    spiced ham)   Spam      TV Dinners Vegetables Canned vegetables (Regular) Relish Canned mushrooms  Sauerkraut Olives    Tomato juice Pickles  Bakery and Dessert Products Canned puddings  Cream pies Cheesecake   Decorated cakes Cookies  Beverages/Juices Tomato juice, regular  Gatorade   V-8 vegetable juice, regular  Breads and Cereals Biscuit mixes   Salted potato chips, corn chips, pretzels Bread stuffing mixes  Salted crackers and rolls Pancake and waffle mixes Self-rising flour  Seasonings Accent    Meat sauces Barbecue sauce  Meat tenderizer Catsup    Monosodium glutamate (MSG) Celery salt   Onion salt Chili sauce   Prepared mustard Garlic salt   Salt, seasoned salt, sea salt Gravy mixes   Soy sauce Horseradish   Steak sauce Ketchup   Tartar sauce Lite salt    Teriyaki sauce Marinade mixes   Worcestershire sauce  Others Baking powder   Cocoa and cocoa mixes Baking soda   Commercial casserole mixes Candy-caramels, chocolate  Dehydrated soups    Bars, fudge,nougats  Instant rice and pasta mixes Canned broth or soup  Maraschino cherries Cheese, aged and processed cheese and cheese spreads  Learning Assessment Quiz  Indicated T (for True) or F (for False) for each of the following statements:  1. _____ Fresh fruits and vegetables and unprocessed grains are generally low in sodium 2. _____ Water may  contain a considerable amount of sodium, depending on the source 3. _____ You can always tell if a food is high in sodium  by tasting it 4. _____ Certain laxatives my be high in sodium and should be avoided unless prescribed   by a physician or pharmacist 5. _____ Salt substitutes may be used freely by anyone on a sodium restricted diet 6. _____ Sodium is present in table salt, food additives and as a natural component of   most foods 7. _____ Table salt is approximately 90% sodium 8. _____ Limiting sodium intake may help prevent excess fluid accumulation in the body 9. _____ On a sodium-restricted diet, seasonings such as bouillon soy sauce, and    cooking wine should be used in place of table salt 10. _____ On an ingredient list, a product which lists monosodium glutamate as the first   ingredient is an appropriate food to include on a low sodium diet  Circle the best answer(s) to the following statements (Hint: there may be more than one correct answer)  11. On a low-sodium diet, some acceptable snack items are:    A. Olives  F. Bean dip   K. Grapefruit juice    B. Salted Pretzels G. Commercial Popcorn   L. Canned peaches    C. Carrot Sticks  H. Bouillon   M. Unsalted nuts   D. [place] fries  I. Peanut butter crackers N. Salami   E. Sweet pickles J. Tomato Juice   O. Pizza  12.  Seasonings that may be used freely on a reduced - sodium diet include   A. Lemon wedges F.Monosodium glutamate K. Celery seed    B.Soysauce   G. Pepper   L. Mustard powder   C. Sea salt  H. Cooking wine  M. Onion flakes   D. Vinegar  E. Prepared horseradish N. Salsa   E. Sage   J. Worcestershire sauce  O. Chutney

## 2019-04-20 ENCOUNTER — Telehealth: Payer: Self-pay | Admitting: *Deleted

## 2019-04-20 NOTE — Telephone Encounter (Signed)
Pt has been scheduled for 06/02/19 with Dr [name] as a virtual video visit. Pt made aware of appt date and time by Dr. Francesca Oman scheduler.

## 2019-04-20 NOTE — Telephone Encounter (Signed)
-----   Message from Rivka Barbara sent at 04/20/2019  8:30 AM EDT ----- Regarding: RE: 2 mo f/u with Dr. Serita Kyle morning,  He is scheduled for June 4th ----- Message ----- From: Nuala Alpha, LPN Sent: 1/60/7371   7:25 AM EDT To: Rivka Barbara Subject: FW: 2 mo f/u with Dr. Lora Paula, can you please call and schedule this pt a 2 month follow-up appt with Dr. [name], sometime in early July, for Virtual visit f/u appt?  This is per Estella Husk PA-C.  Thanks, Karlene Einstein  ----- Message ----- From: Cleon Gustin, RN Sent: 04/19/2019   2:10 PM EDT To: Nuala Alpha, LPN, Imogene Burn, PA-C Subject: 2 mo f/u with Dr. [name]                       Patient had a VIDEO Visit with [facility] with Ermalinda Barrios, PA today. Patient needs a 2 month f/u with Dr. [name]. Patient aware that you will contact him to schedule the appointment. Thanks

## 2019-05-10 ENCOUNTER — Other Ambulatory Visit: Payer: Self-pay | Admitting: Cardiology

## 2019-05-11 NOTE — Telephone Encounter (Signed)
Pt last saw Ermalinda Barrios, PA on 04/19/19 telemedicine COVID-19, last labs 01/17/19 Creat 0.72, age 73, weight 100.9kg, based on s[pecified criteria pt is on appropriate dosage of Eliquis 5mg  BID.  Will refill rx.

## 2019-05-26 ENCOUNTER — Telehealth: Payer: Self-pay | Admitting: *Deleted

## 2019-05-26 NOTE — Telephone Encounter (Signed)
Pt rescheduled for 9/24 with Dr [name]. Pt made aware of appt date and time by Dr. Francesca Oman scheduler.

## 2019-05-26 NOTE — Telephone Encounter (Signed)
-----   Message from Dorothy Spark, MD sent at 05/20/2019  7:28 PM EDT ----- That's ok with me, we can reschedule for august-september ----- Message ----- From: Juventino Slovak, CMA Sent: 05/20/2019   3:45 PM EDT To: Dorothy Spark, MD, Nuala Alpha, LPN, #  Spoke with patient about visit upcoming visit on 06/02/19 and he states that he doesn't feel that this appointment is necessary at this time. He is not having any cardiac issues and will call the office if any concerns arise. He would like to reschedule until August when he can be seen for a live office visit. Thanks, MI

## 2019-06-02 ENCOUNTER — Telehealth: Payer: Self-pay | Admitting: Cardiology

## 2019-06-17 ENCOUNTER — Telehealth: Payer: Self-pay | Admitting: Family Medicine

## 2019-06-17 NOTE — Telephone Encounter (Signed)
Spoke with patient on the phone who states that he developed sore throat on Monday and it has now turned into a dry non productive cough. Patient denies fever, muscle/joint ache, back pain, G.I upset, runny nose, ringing in ears, sinus pain/pressure, visual changes, rash, congestion or sneezing. Patient states that him and his wife have been remaining isolated during this covid pandemic and does not believe that he has had exposure. Please advise. KW

## 2019-06-17 NOTE — Telephone Encounter (Signed)
Does not sound like he has to be tested unless he has fever over 100 or has diminished taste or smell with shortness of breath or flu-like symptoms. If symptoms progress should go to the screening tent or [facility].

## 2019-06-17 NOTE — Telephone Encounter (Signed)
Patient was advised. KW 

## 2019-06-17 NOTE — Telephone Encounter (Signed)
Pt's wife called saying he had a mild sore throat, turned into a cough no fever.  Feels a little more fatigued than normal  They want to know id he should he be tested  CB#  [phone]  Thanks teri

## 2019-08-02 ENCOUNTER — Ambulatory Visit (INDEPENDENT_AMBULATORY_CARE_PROVIDER_SITE_OTHER): Payer: Medicare HMO | Admitting: Family Medicine

## 2019-08-02 ENCOUNTER — Encounter: Payer: Self-pay | Admitting: Family Medicine

## 2019-08-02 VITALS — BP 130/82 | HR 78 | Temp 97.6°F

## 2019-08-02 DIAGNOSIS — R5382 Chronic fatigue, unspecified: Secondary | ICD-10-CM | POA: Diagnosis not present

## 2019-08-02 DIAGNOSIS — M129 Arthropathy, unspecified: Secondary | ICD-10-CM

## 2019-08-02 DIAGNOSIS — J310 Chronic rhinitis: Secondary | ICD-10-CM

## 2019-08-02 DIAGNOSIS — E782 Mixed hyperlipidemia: Secondary | ICD-10-CM

## 2019-08-02 IMAGING — CR DG CHEST 2V
1 series · 2 of 2 positions shown · non-contrast
Comparison: 01/03/2015

CLINICAL DATA: Productive cough for 1 week.  Atrial fibrillation.

EXAM:
CHEST - 2 VIEW

[Series 1: dg chest 2 view · 0.14mm/px · 2 of 2 slices shown]
[im 1/2]
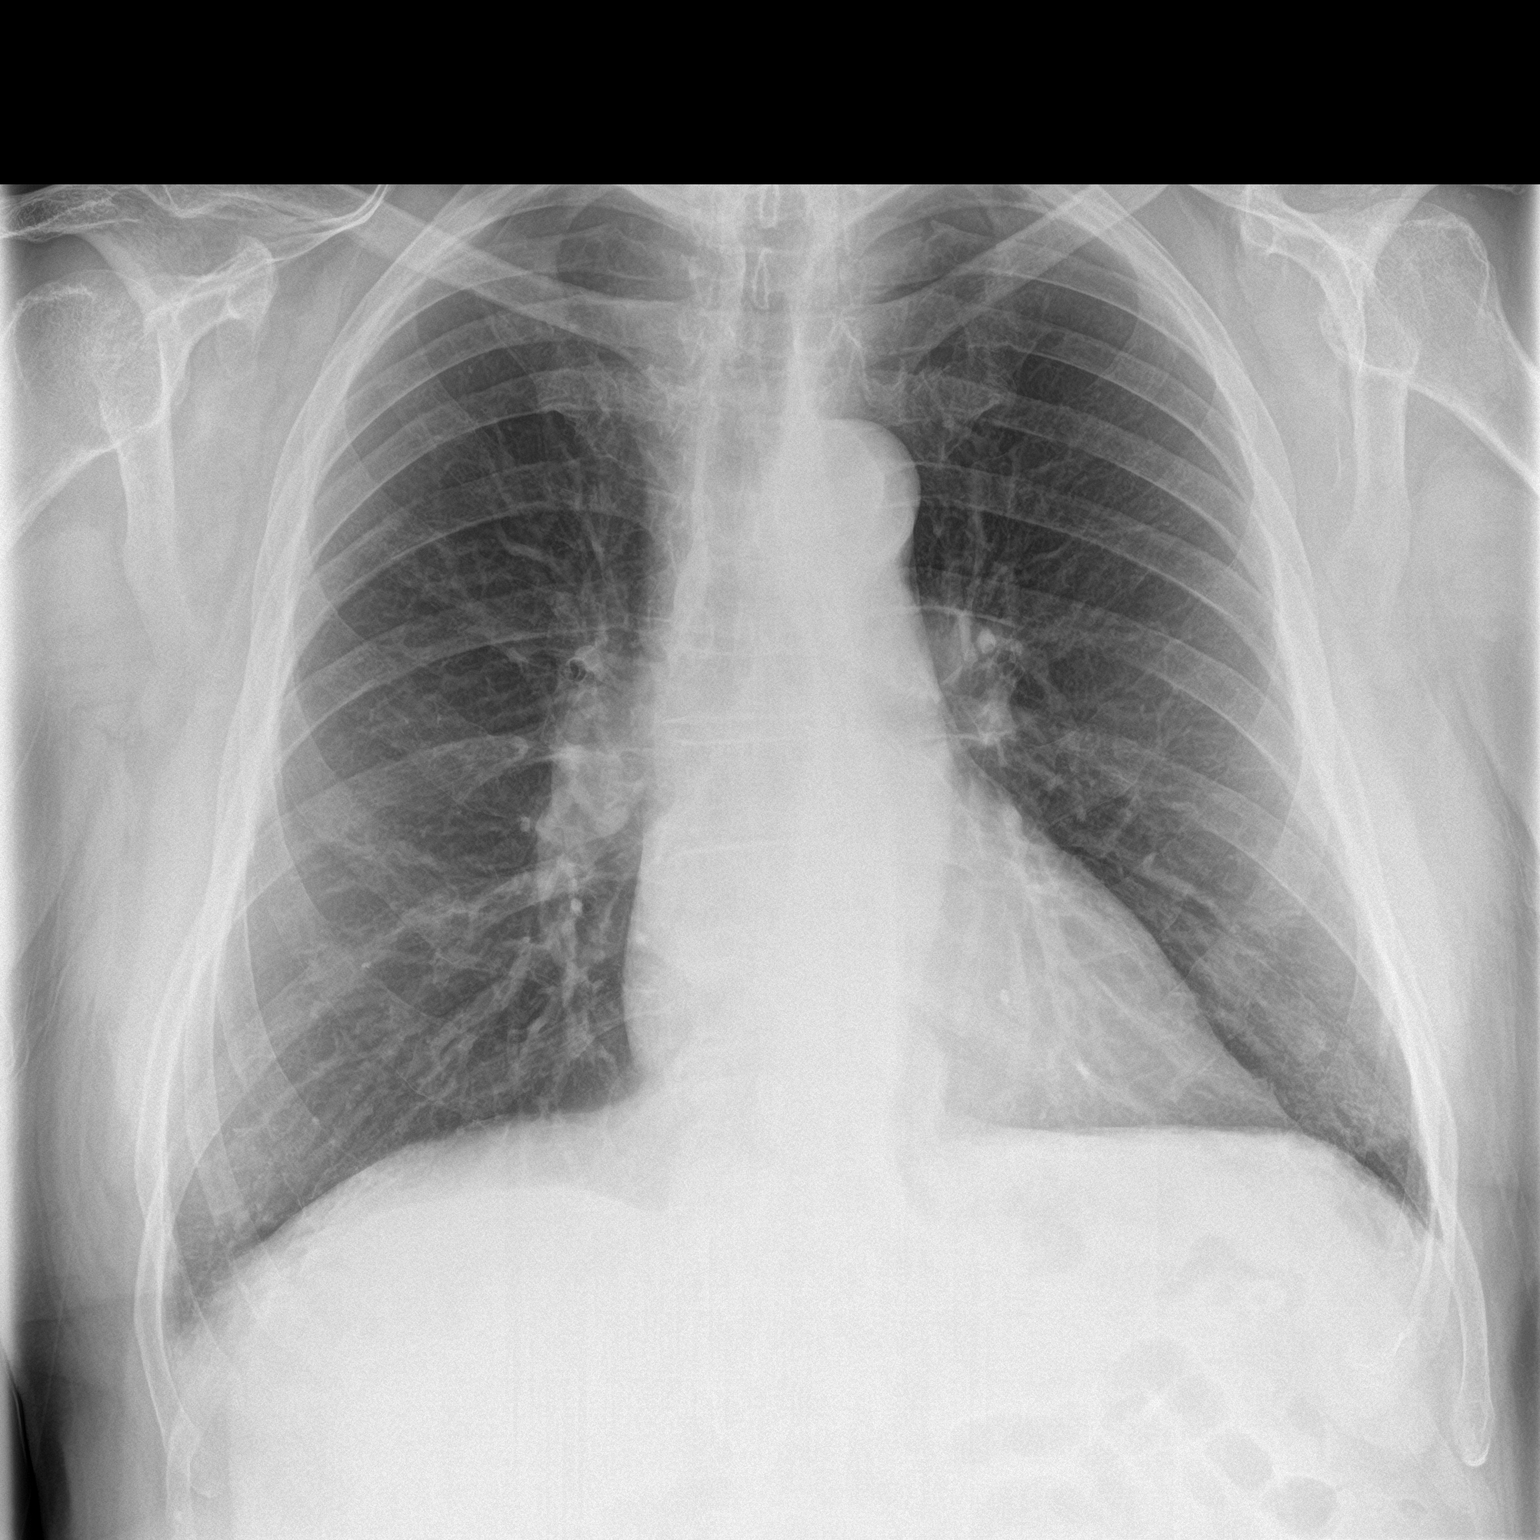
[im 2/2]
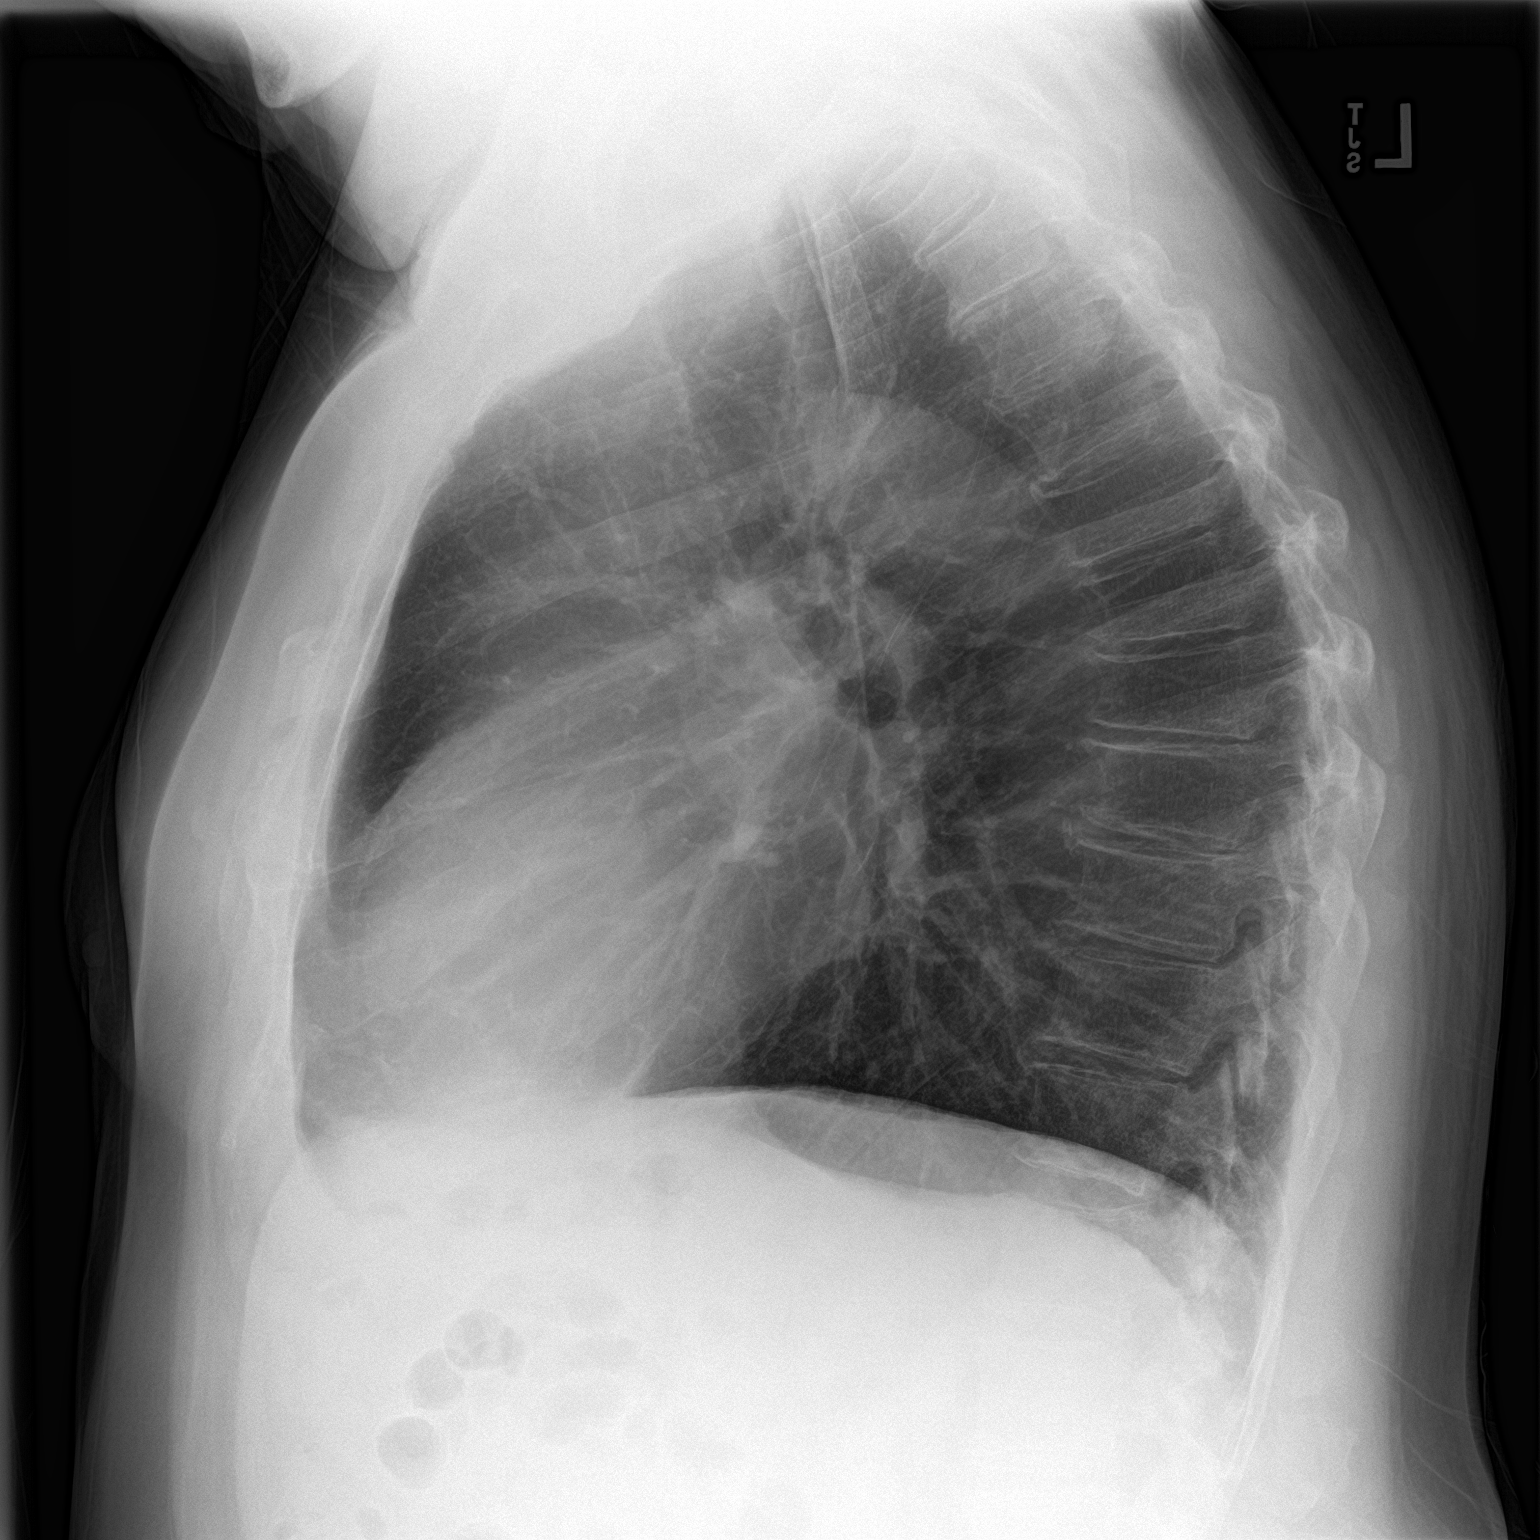

[2 of 2 positions shown; findings below may reference images not displayed]

FINDINGS: Normal heart size. Lungs are mildly hyperaerated with increased AP
diameter of the chest. Central pulmonary arteries are prominent
likely related to COPD and elevated right heart pressures. No
pneumothorax. No pleural effusion. Stable thoracic spine.
IMPRESSION: No active cardiopulmonary disease.  Chronic changes are noted.

## 2019-08-02 NOTE — Progress Notes (Signed)
Eugene White  MRN: [id number] DOB: 01-12-1946  Subjective:  HPI   The patient is 73 year old male who is being seen via phone call due to pandemic.  He states he has had cough and congestion since early spring.  He reports having a chronic runny nose with post nasal drainage the past 3-4 weeks. Denies any fever or sore throat. Slight cough with PND but denies shortness of breath. Having back ache and stiffness he associates with arthritis. Feeling better today but has not tried any medications for symptomatic relief.   Patient states that the fatigue that is now progressed to the point where he is uneasy with his walking. He is not as mobile and therefore causes him to be less active. Some association with arthritis and wonders if low testosterone could be part of the problem.  Patient has SOB with exertion intermittently.  He has history of A Fib but states he believes he is in regular rhythm today.  Patient Active Problem List   Diagnosis Date Noted  . Overweight (BMI 25.0-29.9) 01/20/2018  . Traumatic hematoma of left knee 01/19/2018  . Elevated brain natriuretic peptide (BNP) level 01/11/2018  . Thyroid disease   . Pneumonia   . Mood changes   . Hypertension   . Hyperlipidemia   . GI symptom   . GERD (gastroesophageal reflux disease)   . Facial nerve injury, birth trauma   . Difficulty sleeping   . Arthritis   . S/P left TKA 11/23/2017  . History of removal of retained hardware 10/06/2017  . Left femur IM nail removal 10/05/2017  . Atrial fibrillation with controlled ventricular rate ([place]) 10/05/2017  . Closed intertrochanteric fracture of hip, left, initial encounter ([place]) 10/10/2016  . Esophagitis, reflux 06/27/2015  . Contracture of palmar fascia (Dupuytren's) 06/27/2015  . Arthritis of knee 06/27/2015  . Arthropathia 06/27/2015  . Dupuytren's disease of palm 06/27/2015  . S/P right TKA 01/09/2015  . DJD (degenerative joint disease) of knee 01/09/2015  . Arthritis  of knee, degenerative 01/09/2015  . History of artificial joint 01/09/2015  . Fever, unspecified 06/02/2014  . Loss of weight 06/02/2014  . Leukocytosis, unspecified 06/02/2014  . Abdominal pain, unspecified site 06/02/2014  . Anemia 06/02/2014  . Diverticulosis 12/29/2012  . Hypercholesterolemia without hypertriglyceridemia 05/31/2009  . 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 Benign essential HTN 05/31/2009  . Colon, diverticulosis 05/31/2009  . Clinical depression 05/31/2009  . Diverticulitis large intestine 05/31/2009  . Major depressive disorder with single episode 05/31/2009  . Pure hypercholesterolemia 05/31/2009    Past Medical History:  Diagnosis Date  . A-fib ([place])   . 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 Anemia    hx of one time  . Arthritis    OA  . Atrial fibrillation with controlled ventricular rate ([place]) 10/05/2017  . Benign essential HTN 05/31/2009  . Closed intertrochanteric fracture of hip, left, initial encounter ([place]) 10/10/2016  . Colon, diverticulosis 05/31/2009  . Difficulty sleeping   . Diverticulitis large intestine 05/31/2009  . Diverticulosis 2014  . Dysrhythmia    atrial fib   . Essential (primary) hypertension 05/31/2009  . 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 GERD (gastroesophageal reflux disease)   . GI symptom    had nausea / vomited once / frequent stools / getting better  . Hyperlipidemia   . Hypertension   . Hypothyroidism   . Mood changes   . Pneumonia    hx of  .  Thyroid disease    hypo   Past Surgical History:  Procedure Laterality Date  . CARDIOVERSION N/A 01/12/2018   Procedure: CARDIOVERSION;  Surgeon: Dorothy Spark, MD;  Location: [facility] ENDOSCOPY;  Service: Cardiovascular;  Laterality: N/A;  . CARDIOVERSION N/A 01/26/2019   Procedure: CARDIOVERSION;  Surgeon: Jerline Pain, MD;  Location: [facility] ENDOSCOPY;  Service: Cardiovascular;  Laterality: N/A;  . COLONOSCOPY  2014  . FEMUR IM NAIL Left 10/11/2016    Procedure: INTRAMEDULLARY (IM) NAIL FEMORAL;  Surgeon: [name], MD;  Location: [facility] ORS;  Service: Orthopedics;  Laterality: Left;  . HAND SURGERY   1973 / 2010 / 2015   right to release tendons  . HARDWARE REMOVAL Left 10/05/2017   Procedure: Removal of left femoral nail;  Surgeon: [name], MD;  Location: [facility] ORS;  Service: Orthopedics;  Laterality: Left;  90 mins  . HEMATOMA EVACUATION Left 01/19/2018   Procedure: Irrigation and debridement, Evacuation of left total knee hematoma;  Surgeon: [name], MD;  Location: [facility] ORS;  Service: Orthopedics;  Laterality: Left;  . KNEE ARTHROSCOPY  1984 & 2001   twice  . NASAL SEPTUM SURGERY    . removal Left femoral nail      10/05/17 Dr. [name]  . [place] & 2006   Right and left  . TEE WITH CARDIOVERSION  01/12/2018  . TEE WITHOUT CARDIOVERSION N/A 01/12/2018   Procedure: TRANSESOPHAGEAL ECHOCARDIOGRAM (TEE);  Surgeon: Dorothy Spark, MD;  Location: [facility] ENDOSCOPY;  Service: Cardiovascular;  Laterality: N/A;  . TONSILLECTOMY    . TOTAL KNEE ARTHROPLASTY Right 01/09/2015   Procedure: RIGHT TOTAL KNEE ARTHROPLASTY;  Surgeon: [name], MD;  Location: [facility] ORS;  Service: Orthopedics;  Laterality: Right;  . TOTAL KNEE ARTHROPLASTY Left 11/23/2017   Procedure: LEFT TOTAL KNEE ARTHROPLASTY;  Surgeon: [name], MD;  Location: [facility] ORS;  Service: Orthopedics;  Laterality: Left;  90 mins   Family History  Problem Relation Age of Onset  . Breast cancer Mother   . Colon cancer Neg Hx   . Esophageal cancer Neg Hx   . Stomach cancer Neg Hx   . Rectal cancer Neg Hx    Social History   Socioeconomic History  . Marital status: Married    Spouse name: Not on file  . Number of children: 2  . Years of education: Not on file  . Highest education level: Professional school degree (e.g., MD, DDS, DVM, JD)  Occupational History  . Occupation: retired, has degree in physiology  Social Needs  . Financial  resource strain: Not hard at all  . Food insecurity    Worry: Never true    Inability: Never true  . Transportation needs    Medical: No    Non-medical: No  Tobacco Use  . Smoking status: Former Smoker    Quit date: 12/29/1968    Years since quitting: 50.6  . Smokeless tobacco: Never Used  Substance and Sexual Activity  . Alcohol use: Yes    Alcohol/week: 12.0 standard drinks    Types: 12 Cans of beer per week    Comment: weekly  . Drug use: No  . Sexual activity: Yes  Lifestyle  . Physical activity    Days per week: Not on file    Minutes per session: Not on file  . Stress: Not at all  Relationships  . Social Herbalist on phone: Patient refused    Gets together: Patient  refused    Attends religious service: Patient refused    Active member of club or organization: Patient refused    Attends meetings of clubs or organizations: Patient refused    Relationship status: Patient refused  . Intimate partner violence    Fear of current or ex partner: Patient refused    Emotionally abused: Patient refused    Physically abused: Patient refused    Forced sexual activity: Patient refused  Other Topics Concern  . Not on file  Social History Narrative   Pt lives in [place] w/ wife.   Outpatient Encounter Medications as of 08/02/2019  Medication Sig  . acetaminophen (TYLENOL) 500 MG tablet Take 1,000 mg by mouth daily as needed for moderate pain.  Marland Kitchen amLODipine (NORVASC) 10 MG tablet Take 1 tablet (10 mg total) by mouth daily.  Marland Kitchen atorvastatin (LIPITOR) 20 MG tablet Take 1 tablet (20 mg total) by mouth every evening.  . Cholecalciferol (VITAMIN D) 2000 UNITS CAPS Take 2,000 Units by mouth every evening.   Marland Kitchen ELIQUIS 5 MG TABS tablet TAKE 1 TABLET (5 MG TOTAL) BY MOUTH 2 (TWO) TIMES DAILY.  Marland Kitchen levothyroxine (SYNTHROID, LEVOTHROID) 150 MCG tablet TAKE 1 TABLET EVERY DAY  . metoprolol tartrate (LOPRESSOR) 50 MG tablet TAKE 1 TABLET TWICE DAILY  . Multiple Vitamin (MULTIVITAMIN  WITH MINERALS) TABS tablet Take 1 tablet by mouth daily.   . Multiple Vitamins-Minerals (PRESERVISION AREDS 2) CAPS Take 2 capsules by mouth daily.   . pantoprazole (PROTONIX) 40 MG tablet TAKE 1 TABLET EVERY DAY  . polycarbophil (FIBERCON) 625 MG tablet Take 625 mg by mouth daily.   . valsartan-hydrochlorothiazide (DIOVAN-HCT) 320-25 MG tablet Take 1 tablet by mouth daily.  Marland Kitchen venlafaxine XR (EFFEXOR-XR) 75 MG 24 hr capsule TAKE 1 CAPSULE EVERY DAY   No facility-administered encounter medications on file as of 08/02/2019.    Allergies  Allergen Reactions  . Sulfa Antibiotics Other (See Comments)    Unknown/childhood allergy   Review of Systems  Constitutional: Positive for malaise/fatigue. Negative for chills, diaphoresis and fever.  HENT: Positive for congestion and tinnitus (chronic). Negative for ear pain, sinus pain and sore throat.   Respiratory: Positive for cough, sputum production and shortness of breath (with exertion). Negative for wheezing.   Gastrointestinal: Positive for diarrhea (loose). Negative for abdominal pain, nausea and vomiting.  Musculoskeletal: Positive for back pain (lower back tightness). Negative for myalgias.    Objective:  BP 130/82   Pulse 78   Temp 97.6 F (36.4 C) (Oral)   Some sniffles during telephonic conference. No apparent respiratory distress.   Assessment and Plan :   1. Chronic rhinitis Intermittent the past 3-4 weeks that he is feels is due to allergic rhinitis. May use Loratadine with Flonase Nasal Spray daily. Check CBC for signs of infection. No fever and only minimal cough from PND. Denies the possibility of COVID infection (does not want any test for it now).  - CBC with Differential/Platelet  2. Chronic fatigue Present for a long time prior to the past 3-4 weeks. Worse decrease in energy level the past month but no fever or shortness of breath. Libido has been down for several years. Will check for anemia, electrolyte imbalance, changes  in hypothyroidism and possible low testosterone level with blood work. - CBC with Differential/Platelet - Comprehensive metabolic panel - TSH - Testosterone,Free and Total  3. Arthropathia Lumbar degenerative disease documented on x-rays when he fractured his left hip on 10-10-16. Rarely uses any NSAID because of  taking Eliquis BID. No hematuria or hematochezia. May apply topical cream or moist heat and check CBC. - CBC with Differential/Platelet  4. Mixed hyperlipidemia Tolerating Lipitor 20 mg q evening and trying to restrict fats in diet. Will recheck CMP, Lipid Panel and TSH. - Comprehensive metabolic panel - Lipid panel - TSH

## 2019-08-04 DIAGNOSIS — R5382 Chronic fatigue, unspecified: Secondary | ICD-10-CM | POA: Diagnosis not present

## 2019-08-04 DIAGNOSIS — M129 Arthropathy, unspecified: Secondary | ICD-10-CM | POA: Diagnosis not present

## 2019-08-04 DIAGNOSIS — J310 Chronic rhinitis: Secondary | ICD-10-CM | POA: Diagnosis not present

## 2019-08-04 DIAGNOSIS — E782 Mixed hyperlipidemia: Secondary | ICD-10-CM | POA: Diagnosis not present

## 2019-08-05 ENCOUNTER — Telehealth: Payer: Self-pay

## 2019-08-05 NOTE — Telephone Encounter (Signed)
Lab results given to patient's wife.

## 2019-08-05 NOTE — Telephone Encounter (Signed)
-----   Message from [name], [place] sent at 08/05/2019  8:26 AM EDT ----- All tests essentially normal with only minor variations. Total testosterone level in the middle of normal range but awaiting the final report on the free testosterone level. TSH normal. Lipid panel in very good shape. Continue present medication regimen and recheck levels annually.

## 2019-08-07 LAB — COMPREHENSIVE METABOLIC PANEL
ALT: 35 IU/L (ref 0–44)
AST: 37 IU/L (ref 0–40)
Albumin/Globulin Ratio: 2 (ref 1.2–2.2)
Albumin: 4.5 g/dL (ref 3.7–4.7)
Alkaline Phosphatase: 89 IU/L (ref 39–117)
BUN/Creatinine Ratio: 13 (ref 10–24)
BUN: 12 mg/dL (ref 8–27)
Bilirubin Total: 0.7 mg/dL (ref 0.0–1.2)
CO2: 23 mmol/L (ref 20–29)
Calcium: 9.8 mg/dL (ref 8.6–10.2)
Chloride: 97 mmol/L (ref 96–106)
Creatinine, Ser: 0.94 mg/dL (ref 0.76–1.27)
GFR calc Af Amer: 93 mL/min/{1.73_m2} (ref >59)
GFR calc non Af Amer: 80 mL/min/{1.73_m2} (ref >59)
Globulin, Total: 2.3 g/dL (ref 1.5–4.5)
Glucose: 105 mg/dL — ABNORMAL HIGH (ref 65–99)
Potassium: 3.9 mmol/L (ref 3.5–5.2)
Sodium: 137 mmol/L (ref 134–144)
Total Protein: 6.8 g/dL (ref 6.0–8.5)

## 2019-08-07 LAB — LIPID PANEL
Chol/HDL Ratio: 2.9 ratio (ref 0.0–5.0)
Cholesterol, Total: 185 mg/dL (ref 100–199)
HDL: 63 mg/dL (ref >39)
LDL Calculated: 92 mg/dL (ref 0–99)
Triglycerides: 152 mg/dL — ABNORMAL HIGH (ref 0–149)
VLDL Cholesterol Cal: 30 mg/dL (ref 5–40)

## 2019-08-07 LAB — CBC WITH DIFFERENTIAL/PLATELET
Basophils Absolute: 0 10*3/uL (ref 0.0–0.2)
Basos: 1 %
EOS (ABSOLUTE): 0.1 10*3/uL (ref 0.0–0.4)
Eos: 1 %
Hematocrit: 42.7 % (ref 37.5–51.0)
Hemoglobin: 14.9 g/dL (ref 13.0–17.7)
Immature Grans (Abs): 0 10*3/uL (ref 0.0–0.1)
Immature Granulocytes: 0 %
Lymphocytes Absolute: 1.9 10*3/uL (ref 0.7–3.1)
Lymphs: 30 %
MCH: 34.3 pg — ABNORMAL HIGH (ref 26.6–33.0)
MCHC: 34.9 g/dL (ref 31.5–35.7)
MCV: 98 fL — ABNORMAL HIGH (ref 79–97)
Monocytes Absolute: 0.7 10*3/uL (ref 0.1–0.9)
Monocytes: 12 %
Neutrophils Absolute: 3.5 10*3/uL (ref 1.4–7.0)
Neutrophils: 56 %
Platelets: 247 10*3/uL (ref 150–450)
RBC: 4.34 x10E6/uL (ref 4.14–5.80)
RDW: 12 % (ref 11.6–15.4)
WBC: 6.2 10*3/uL (ref 3.4–10.8)

## 2019-08-07 LAB — TESTOSTERONE,FREE AND TOTAL
Testosterone, Free: 7.7 pg/mL (ref 6.6–18.1)
Testosterone: 494 ng/dL (ref 264–916)

## 2019-08-07 LAB — TSH: TSH: 1.47 u[IU]/mL (ref 0.450–4.500)

## 2019-08-30 ENCOUNTER — Encounter: Payer: Self-pay | Admitting: Cardiology

## 2019-09-19 ENCOUNTER — Ambulatory Visit: Payer: Medicare HMO | Admitting: Cardiology

## 2019-09-19 ENCOUNTER — Encounter: Payer: Self-pay | Admitting: Cardiology

## 2019-09-19 ENCOUNTER — Other Ambulatory Visit: Payer: Self-pay

## 2019-09-19 ENCOUNTER — Encounter (INDEPENDENT_AMBULATORY_CARE_PROVIDER_SITE_OTHER): Payer: Self-pay

## 2019-09-19 VITALS — BP 122/72 | HR 73 | Ht 71.0 in | Wt 218.4 lb

## 2019-09-19 DIAGNOSIS — I480 Paroxysmal atrial fibrillation: Secondary | ICD-10-CM | POA: Diagnosis not present

## 2019-09-19 MED ORDER — FLECAINIDE ACETATE 50 MG PO TABS: 50.0000 mg | ORAL_TABLET | Freq: Two times a day (BID) | ORAL | 3 refills | Status: DC

## 2019-09-19 NOTE — Progress Notes (Signed)
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  Patient was last seen in our office 02/10/2019 at which time he felt like he was going in and out of atrial fibrillation.  Had broken his ankle and felt like he had brief episodes of A. fib which usually happens when he is under stress.  Rate was controlled with metoprolol.  dronederone was discussed but patient did not want to add any medications.  She also is just in the cardia app for his smart phone so he could check his rhythm if he has palpitations.  He was feeling his heart beat to be irregular at the office visit but he was in normal sinus rhythm with PACs.  Blood pressure was running high so valsartan was increased.  09/19/2019 -patient states that he has been more tired, and he lacks any energy, and has been experiencing falls that started this summer.  The falls are related to poor balance that got worse after he broke his ankle in February and now he trips over different objects.  He denies any lower extremity edema he does not have exertional chest pain just lack of energy or motivation.  Past Medical History:  Diagnosis Date  . A-fib ([place])   . 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 Anemia    hx of one time  . Arthritis    OA  . Atrial fibrillation with controlled ventricular rate ([place]) 10/05/2017  . Benign essential HTN 05/31/2009  . Closed intertrochanteric fracture of hip, left, initial encounter ([place]) 10/10/2016  . Colon, diverticulosis 05/31/2009  . Difficulty sleeping   . Diverticulitis large intestine 05/31/2009  . Diverticulosis 2014  .  Dysrhythmia    atrial fib   . Essential (primary) hypertension 05/31/2009  . 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 GERD (gastroesophageal reflux disease)   . GI symptom    had nausea / vomited once / frequent stools / getting better  . Hyperlipidemia   . Hypertension   . Hypothyroidism   . Mood changes   . Pneumonia    hx of  . Thyroid disease    hypo    Past Surgical History:  Procedure Laterality Date  . CARDIOVERSION N/A 01/12/2018   Procedure: CARDIOVERSION;  Surgeon: Dorothy Spark, MD;  Location: [facility] ENDOSCOPY;  Service: Cardiovascular;  Laterality: N/A;  . CARDIOVERSION N/A 01/26/2019   Procedure: CARDIOVERSION;  Surgeon: Jerline Pain, MD;  Location: [facility] ENDOSCOPY;  Service: Cardiovascular;  Laterality: N/A;  . COLONOSCOPY  2014  . FEMUR IM NAIL Left 10/11/2016   Procedure: INTRAMEDULLARY (IM) NAIL FEMORAL;  Surgeon: [name], MD;  Location: [facility] ORS;  Service: Orthopedics;  Laterality: Left;  . HAND SURGERY   1973 / 2010 / 2015   right to release tendons  . HARDWARE REMOVAL Left 10/05/2017   Procedure: Removal of left femoral nail;  Surgeon: [name], MD;  Location: [facility] ORS;  Service: Orthopedics;  Laterality: Left;  90 mins  . HEMATOMA EVACUATION Left 01/19/2018   Procedure:  Irrigation and debridement, Evacuation of left total knee hematoma;  Surgeon: [name], MD;  Location: [facility] ORS;  Service: Orthopedics;  Laterality: Left;  . KNEE ARTHROSCOPY  1984 & 2001   twice  . NASAL SEPTUM SURGERY    . removal Left femoral nail      10/05/17 Dr. [name]  . [place] & 2006   Right and left  . TEE WITH CARDIOVERSION  01/12/2018  . TEE WITHOUT CARDIOVERSION N/A 01/12/2018   Procedure: TRANSESOPHAGEAL ECHOCARDIOGRAM (TEE);  Surgeon: Dorothy Spark, MD;  Location: [facility] ENDOSCOPY;  Service: Cardiovascular;  Laterality: N/A;  . TONSILLECTOMY    . TOTAL KNEE ARTHROPLASTY Right 01/09/2015   Procedure: RIGHT TOTAL  KNEE ARTHROPLASTY;  Surgeon: [name], MD;  Location: [facility] ORS;  Service: Orthopedics;  Laterality: Right;  . TOTAL KNEE ARTHROPLASTY Left 11/23/2017   Procedure: LEFT TOTAL KNEE ARTHROPLASTY;  Surgeon: [name], MD;  Location: [facility] ORS;  Service: Orthopedics;  Laterality: Left;  90 mins    Current Medications: Current Meds  Medication Sig  . acetaminophen (TYLENOL) 500 MG tablet Take 1,000 mg by mouth daily as needed for moderate pain.  Marland Kitchen amLODipine (NORVASC) 10 MG tablet Take 1 tablet (10 mg total) by mouth daily.  Marland Kitchen atorvastatin (LIPITOR) 20 MG tablet Take 1 tablet (20 mg total) by mouth every evening.  . Cholecalciferol (VITAMIN D) 2000 UNITS CAPS Take 2,000 Units by mouth every evening.   Marland Kitchen ELIQUIS 5 MG TABS tablet TAKE 1 TABLET (5 MG TOTAL) BY MOUTH 2 (TWO) TIMES DAILY.  Marland Kitchen levothyroxine (SYNTHROID, LEVOTHROID) 150 MCG tablet TAKE 1 TABLET EVERY DAY  . metoprolol tartrate (LOPRESSOR) 50 MG tablet TAKE 1 TABLET TWICE DAILY  . Multiple Vitamin (MULTIVITAMIN WITH MINERALS) TABS tablet Take 1 tablet by mouth daily.   . Multiple Vitamins-Minerals (PRESERVISION AREDS 2) CAPS Take 2 capsules by mouth daily.   . pantoprazole (PROTONIX) 40 MG tablet TAKE 1 TABLET EVERY DAY  . polycarbophil (FIBERCON) 625 MG tablet Take 625 mg by mouth daily.   . valsartan-hydrochlorothiazide (DIOVAN-HCT) 320-25 MG tablet Take 1 tablet by mouth daily.  Marland Kitchen venlafaxine XR (EFFEXOR-XR) 75 MG 24 hr capsule TAKE 1 CAPSULE EVERY DAY     Allergies:   Sulfa antibiotics   Social History   Socioeconomic History  . Marital status: Married    Spouse name: Not on file  . Number of children: 2  . Years of education: Not on file  . Highest education level: Professional school degree (e.g., MD, DDS, DVM, JD)  Occupational History  . Occupation: retired, has degree in physiology  Social Needs  . Financial resource strain: Not hard at all  . Food insecurity    Worry: Never true    Inability: Never true  .  Transportation needs    Medical: No    Non-medical: No  Tobacco Use  . Smoking status: Former Smoker    Quit date: 12/29/1968    Years since quitting: 50.7  . Smokeless tobacco: Never Used  Substance and Sexual Activity  . Alcohol use: Yes    Alcohol/week: 12.0 standard drinks    Types: 12 Cans of beer per week    Comment: weekly  . Drug use: No  . Sexual activity: Yes  Lifestyle  . Physical activity    Days per week: Not on file    Minutes per session: Not on file  . Stress: Not at all  Relationships  . Social connections  Talks on phone: Patient refused    Gets together: Patient refused    Attends religious service: Patient refused    Active member of club or organization: Patient refused    Attends meetings of clubs or organizations: Patient refused    Relationship status: Patient refused  Other Topics Concern  . Not on file  Social History Narrative   Pt lives in [place] w/ wife.     Family History: The patient's family history includes Breast cancer in his mother. There is no history of Colon cancer, Esophageal cancer, Stomach cancer, or Rectal cancer.  ROS:   Please see the history of present illness.     All other systems reviewed and are negative.  EKGs/Labs/Other Studies Reviewed:    The following studies were reviewed today:  EKG:  EKG is ordered today.  The ekg ordered today demonstrates atrial fibrillation  Recent Labs: 08/04/2019: ALT 35; BUN 12; Creatinine, Ser 0.94; Hemoglobin 14.9; Platelets 247; Potassium 3.9; Sodium 137; TSH 1.470  Recent Lipid Panel    Component Value Date/Time   CHOL 185 08/04/2019 1359   TRIG 152 (H) 08/04/2019 1359   HDL 63 08/04/2019 1359   CHOLHDL 2.9 08/04/2019 1359   CHOLHDL 3.8 10/11/2016 0337   VLDL UNABLE TO CALCULATE IF TRIGLYCERIDE OVER 400 mg/dL 10/11/2016 0337   LDLCALC 92 08/04/2019 1359    Physical Exam:    VS:  BP 122/72   Pulse 73   Ht 5\' 11"  (1.803 m)   Wt 218 lb 6.4 oz (99.1 kg)   SpO2 98%    BMI 30.46 kg/m     Wt Readings from Last 3 Encounters:  09/19/19 218 lb 6.4 oz (99.1 kg)  04/19/19 220 lb (99.8 kg)  02/10/19 222 lb 6.4 oz (100.9 kg)    GEN:  Well nourished, well developed in no acute distress HEENT: Normal NECK: No JVD; No carotid bruits LYMPHATICS: No lymphadenopathy CARDIAC: iRRR, no murmurs, rubs, gallops RESPIRATORY:  Clear to auscultation without rales, wheezing or rhonchi  ABDOMEN: Soft, non-tender, non-distended MUSCULOSKELETAL:  No edema; No deformity  SKIN: Warm and dry NEUROLOGIC:  Alert and oriented x 3 PSYCHIATRIC:  Normal affect   ASSESSMENT:    1. Paroxysmal atrial fibrillation (HCC)    PLAN:    In order of problems listed above:  1. PAF on Eliquis and metoprolol, no bleeding, he has been compliant with Eliquis and has not missed any doses.  Normal hemoglobin, he is currently in atrial fibrillation with ventricular rate 70 however he feels significantly more tired when he is in A. fib, so far it is felt to cardioversion where he stayed in sinus rhythm for several months but afterwards went back to A. fib.  Patient feels that he goes in and out.  2. We will start flecainide 50 mg p.o. twice daily, will arrange for follow-up visit to check his EKG in a week to check his QRS width and also to see if he is in sinus rhythm if he is not, we will arrange for cardioversion.  3. We have discussed other options with the patient, if he fails this cardioversion and he goes back to atrial fibrillation, will refer him for an ablation. 4. Essential hypertension on amlodipine 5 mg daily valsartan HCTZ 320/25 mg-BP high today. Increase amlodipine 10 mg daily-call if leg swelling, 2 gm sodium diet, increase exercise.  5. Hyperlipidemia on Lipitor 20 mg, well-tolerated, lipids are at goal.  Labs were checked in August and they were all normal.  Medication Adjustments/Labs and Tests Ordered: Current medicines are reviewed at length with the patient today.  Concerns  regarding medicines are outlined above.  Orders Placed This Encounter  Procedures  . EKG 12-Lead   Meds ordered this encounter  Medications  . flecainide (TAMBOCOR) 50 MG tablet    Sig: Take 1 tablet (50 mg total) by mouth 2 (two) times daily.    Dispense:  180 tablet    Refill:  3    Patient Instructions  Medication Instructions:  1) START FLECAINIDE 50 mg twice daily  Follow-Up: You have a follow-up appointment with Dr. Francesca Oman assistant, Cecilie Kicks, on 09/26/2019 at 2:00PM.    Signed, Ena Dawley, MD  09/19/2019 4:08 PM    [place]

## 2019-09-19 NOTE — Patient Instructions (Addendum)
Medication Instructions:  1) START FLECAINIDE 50 mg twice daily  Follow-Up: You have a follow-up appointment with Dr. Francesca Oman assistant, Cecilie Kicks, on 09/26/2019 at 2:00PM.

## 2019-09-22 ENCOUNTER — Ambulatory Visit: Payer: Medicare HMO | Admitting: Cardiology

## 2019-09-26 ENCOUNTER — Encounter: Payer: Self-pay | Admitting: Cardiology

## 2019-09-26 ENCOUNTER — Inpatient Hospital Stay (HOSPITAL_COMMUNITY): Admission: RE | Admit: 2019-09-26 | Payer: Medicare HMO | Source: Ambulatory Visit

## 2019-09-26 ENCOUNTER — Other Ambulatory Visit: Payer: Self-pay

## 2019-09-26 ENCOUNTER — Ambulatory Visit: Payer: Medicare HMO | Admitting: Cardiology

## 2019-09-26 VITALS — BP 124/86 | HR 65 | Ht 71.0 in | Wt 219.8 lb

## 2019-09-26 DIAGNOSIS — E7800 Pure hypercholesterolemia, unspecified: Secondary | ICD-10-CM

## 2019-09-26 DIAGNOSIS — I10 Essential (primary) hypertension: Secondary | ICD-10-CM

## 2019-09-26 DIAGNOSIS — I4819 Other persistent atrial fibrillation: Secondary | ICD-10-CM

## 2019-09-26 DIAGNOSIS — I480 Paroxysmal atrial fibrillation: Secondary | ICD-10-CM | POA: Diagnosis not present

## 2019-09-26 NOTE — Patient Instructions (Addendum)
Medication Instructions:   Your physician recommends that you continue on your current medications as directed. Please refer to the Current Medication list given to you today.  If you need a refill on your cardiac medications before your next appointment, please call your pharmacy.   Lab work:  You will have labs drawn today: BMET and CBC  If you have labs (blood work) drawn today and your tests are completely normal, you will receive your results only by: Marland Kitchen MyChart Message (if you have MyChart) OR . A paper copy in the mail If you have any lab test that is abnormal or we need to change your treatment, we will call you to review the results.  Testing/Procedures:  Your physician has recommended that you have a Cardioversion (DCCV). Electrical Cardioversion uses a jolt of electricity to your heart either through paddles or wired patches attached to your chest. This is a controlled, usually prescheduled, procedure. Defibrillation is done under light anesthesia in the hospital, and you usually go home the day of the procedure. This is done to get your heart back into a normal rhythm. You are not awake for the procedure. Please see the instruction sheet given to you today.   Follow-Up:  On 10/12/19 at 11:30AM with Ermalinda Barrios, PA   Any Other Special Instructions Will Be Listed Below (If Applicable).  Your Pre-procedure COVID-19 Testing will be done on 09/26/19 at 3:20PM at [facility] drive-thru at [id number], [place], [place]. After your swab you will be given a mask to wear and instructed to go home and quarantine/no visitors until after your procedure. If you test positive you will be notified and your procedure will be cancelled.     You are scheduled for a Cardioversion on 09/30/19 with Dr. Stanford Breed.  Please arrive at the [facility] (Main Entrance A) at [facility]: [address] [place], [place] [address] at 9:45 am. (1 hour prior to procedure unless lab  work is needed; if lab work is needed arrive 1.5 hours ahead)  DIET: Nothing to eat or drink after midnight except a sip of water with medications (see medication instructions below)  Medication Instructions:  Continue your anticoagulant: Eliquis   You must have a responsible person to drive you home and stay in the waiting area during your procedure. Failure to do so could result in cancellation.  Bring your insurance cards.  *Special Note: Every effort is made to have your procedure done on time. Occasionally there are emergencies that occur at the hospital that may cause delays. Please be patient if a delay does occur.

## 2019-09-26 NOTE — Progress Notes (Signed)
Cardiology Office Note   Date:  09/27/2019   ID:  Eugene White, DOB 05/22/1946, MRN [id number]  PCP:  [name], PA  Cardiologist:  Dr. [name]    Chief Complaint  Patient presents with  . Atrial Fibrillation      History of Present Illness: Eugene White is a 73 y.o. male who presents for eval EKG on flecainide and plan DCCV.  Pt is a Marketing executive, with with history of PAF on Eliquis status post cardioversion 12/2018, hypertension, HLD.  Patient was last seen in our office 02/10/2019 at which time he felt like he was going in and out of atrial fibrillation.  Had broken his ankle and felt like he had brief episodes of A. fib which usually happens when he is under stress.  Rate was controlled with metoprolol.  dronederone was discussed but patient did not want to add any medications.  She also is just in the cardia app for his smart phone so he could check his rhythm if he has palpitations.  He was feeling his heart beat to be irregular at the office visit but he was in normal sinus rhythm with PACs.  Blood pressure was running high so valsartan was increased.  09/19/2019 -patient states that he has been more tired, and he lacks any energy, and has been experiencing falls that started this summer.  The falls are related to poor balance that got worse after he broke his ankle in February and now he trips over different objects.  He denies any lower extremity edema he does not have exertional chest pain just lack of energy or motivation.  On that last visit flecainide 50 mg BID was started.  Here for EKG. If a fib then plan DCCV.  Also amlodipine was increased.  HLD stable. Today pt is still in a fib rate controlled at rest.  He still has fatigue and cannot due his usual activities.  He would like to schedule DCCV.  Discussed risks and he has had 2 prior ones. He has not missed any eliquis.  No angina.     Past Medical History:  Diagnosis Date  . A-fib ([place])   . Adult  hypothyroidism 05/31/2009  . Anemia    hx of one time  . Arthritis    OA  . Atrial fibrillation with controlled ventricular rate ([place]) 10/05/2017  . Benign essential HTN 05/31/2009  . Closed intertrochanteric fracture of hip, left, initial encounter ([place]) 10/10/2016  . Colon, diverticulosis 05/31/2009  . Difficulty sleeping   . Diverticulitis large intestine 05/31/2009  . Diverticulosis 2014  . Dysrhythmia    atrial fib   . Essential (primary) hypertension 05/31/2009  . 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 GERD (gastroesophageal reflux disease)   . GI symptom    had nausea / vomited once / frequent stools / getting better  . Hyperlipidemia   . Hypertension   . Hypothyroidism   . Mood changes   . Pneumonia    hx of  . Thyroid disease    hypo    Past Surgical History:  Procedure Laterality Date  . CARDIOVERSION N/A 01/12/2018   Procedure: CARDIOVERSION;  Surgeon: Dorothy Spark, MD;  Location: [facility] ENDOSCOPY;  Service: Cardiovascular;  Laterality: N/A;  . CARDIOVERSION N/A 01/26/2019   Procedure: CARDIOVERSION;  Surgeon: Jerline Pain, MD;  Location: [facility] ENDOSCOPY;  Service: Cardiovascular;  Laterality: N/A;  . COLONOSCOPY  2014  . FEMUR  IM NAIL Left 10/11/2016   Procedure: INTRAMEDULLARY (IM) NAIL FEMORAL;  Surgeon: [name], MD;  Location: [facility] ORS;  Service: Orthopedics;  Laterality: Left;  . HAND SURGERY   1973 / 2010 / 2015   right to release tendons  . HARDWARE REMOVAL Left 10/05/2017   Procedure: Removal of left femoral nail;  Surgeon: [name], MD;  Location: [facility] ORS;  Service: Orthopedics;  Laterality: Left;  90 mins  . HEMATOMA EVACUATION Left 01/19/2018   Procedure: Irrigation and debridement, Evacuation of left total knee hematoma;  Surgeon: [name], MD;  Location: [facility] ORS;  Service: Orthopedics;  Laterality: Left;  . KNEE ARTHROSCOPY  1984 & 2001   twice  . NASAL SEPTUM SURGERY    . removal Left femoral nail      10/05/17 Dr.  [name]  . [place] & 2006   Right and left  . TEE WITH CARDIOVERSION  01/12/2018  . TEE WITHOUT CARDIOVERSION N/A 01/12/2018   Procedure: TRANSESOPHAGEAL ECHOCARDIOGRAM (TEE);  Surgeon: Dorothy Spark, MD;  Location: [facility] ENDOSCOPY;  Service: Cardiovascular;  Laterality: N/A;  . TONSILLECTOMY    . TOTAL KNEE ARTHROPLASTY Right 01/09/2015   Procedure: RIGHT TOTAL KNEE ARTHROPLASTY;  Surgeon: [name], MD;  Location: [facility] ORS;  Service: Orthopedics;  Laterality: Right;  . TOTAL KNEE ARTHROPLASTY Left 11/23/2017   Procedure: LEFT TOTAL KNEE ARTHROPLASTY;  Surgeon: [name], MD;  Location: [facility] ORS;  Service: Orthopedics;  Laterality: Left;  90 mins     Current Outpatient Medications  Medication Sig Dispense Refill  . acetaminophen (TYLENOL) 500 MG tablet Take 1,500 mg by mouth at bedtime as needed for mild pain or moderate pain.     Marland Kitchen amLODipine (NORVASC) 10 MG tablet Take 1 tablet (10 mg total) by mouth daily. (Patient taking differently: Take 10 mg by mouth daily. Noon) 90 tablet 3  . atorvastatin (LIPITOR) 20 MG tablet Take 1 tablet (20 mg total) by mouth every evening. (Patient taking differently: Take 20 mg by mouth daily. ) 90 tablet 4  . Cholecalciferol (VITAMIN D) 2000 UNITS CAPS Take 2,000 Units by mouth every evening.     Marland Kitchen ELIQUIS 5 MG TABS tablet TAKE 1 TABLET (5 MG TOTAL) BY MOUTH 2 (TWO) TIMES DAILY. (Patient taking differently: Take 5 mg by mouth 2 (two) times daily. ) 180 tablet 2  . flecainide (TAMBOCOR) 50 MG tablet Take 1 tablet (50 mg total) by mouth 2 (two) times daily. 180 tablet 3  . levothyroxine (SYNTHROID, LEVOTHROID) 150 MCG tablet TAKE 1 TABLET EVERY DAY (Patient taking differently: Take 150 mcg by mouth daily before breakfast. ) 90 tablet 4  . metoprolol tartrate (LOPRESSOR) 50 MG tablet TAKE 1 TABLET TWICE DAILY (Patient taking differently: Take 50 mg by mouth 2 (two) times daily. ) 180 tablet 3  . Multiple Vitamin (MULTIVITAMIN  WITH MINERALS) TABS tablet Take 1 tablet by mouth daily.     . Multiple Vitamins-Minerals (PRESERVISION AREDS 2) CAPS Take 1 capsule by mouth 2 (two) times daily.     . pantoprazole (PROTONIX) 40 MG tablet TAKE 1 TABLET EVERY DAY (Patient taking differently: Take 40 mg by mouth daily. ) 90 tablet 4  . polycarbophil (FIBERCON) 625 MG tablet Take 625 mg by mouth daily.     . valsartan-hydrochlorothiazide (DIOVAN-HCT) 320-25 MG tablet Take 1 tablet by mouth daily. 90 tablet 3  . venlafaxine XR (EFFEXOR-XR) 75 MG 24 hr capsule TAKE 1 CAPSULE EVERY DAY (  Patient taking differently: Take 75 mg by mouth daily with breakfast. ) 90 capsule 3  . ibuprofen (ADVIL) 200 MG tablet Take 800 mg by mouth daily as needed for moderate pain.    . Menthol, Topical Analgesic, (BLUE-EMU MAXIMUM STRENGTH EX) Apply 1 application topically at bedtime as needed (pain).     No current facility-administered medications for this visit.     Allergies:   Sulfa antibiotics    Social History:  The patient  reports that he quit smoking about 50 years ago. He has never used smokeless tobacco. He reports current alcohol use of about 12.0 standard drinks of alcohol per week. He reports that he does not use drugs.   Family History:  The patient's family history includes Breast cancer in his mother.    ROS:  General:no colds or fevers, no weight changes Skin:no rashes or ulcers HEENT:no blurred vision, no congestion CV:see HPI PUL:see HPI GI:no diarrhea constipation or melena, no indigestion GU:no hematuria, no dysuria MS:no joint pain, no claudication Neuro:no syncope, no lightheadedness Endo:no diabetes, + thyroid disease  Wt Readings from Last 3 Encounters:  09/26/19 219 lb 12.8 oz (99.7 kg)  09/19/19 218 lb 6.4 oz (99.1 kg)  04/19/19 220 lb (99.8 kg)     PHYSICAL EXAM: VS:  BP 124/86   Pulse 65   Ht 5\' 11"  (1.803 m)   Wt 219 lb 12.8 oz (99.7 kg)   SpO2 90%   BMI 30.66 kg/m  , BMI Body mass index is 30.66  kg/m. General:Pleasant affect, NAD Skin:Warm and dry, brisk capillary refill HEENT:normocephalic, sclera clear, mucus membranes moist Neck:supple, no JVD, no bruits  Heart:irreg irreg without murmur, gallup, rub or click Lungs:clear without rales, rhonchi, or wheezes [id number], non tender, + BS, do not palpate liver spleen or masses Ext:no lower ext edema, 2+ pedal pulses, 2+ radial pulses Neuro:alert and oriented X 3, MAE, follows commands, + facial symmetry    EKG:  EKG is ordered today. The ekg ordered today demonstrates atrial fib low voltage QRS septal infarct and QRS 96 ms up from 76 ms.  HR 65.    Recent Labs: 08/04/2019: ALT 35; TSH 1.470 09/26/2019: BUN 13; Creatinine, Ser 1.04; Hemoglobin 14.1; Platelets 243; Potassium 3.7; Sodium 139    Lipid Panel    Component Value Date/Time   CHOL 185 08/04/2019 1359   TRIG 152 (H) 08/04/2019 1359   HDL 63 08/04/2019 1359   CHOLHDL 2.9 08/04/2019 1359   CHOLHDL 3.8 10/11/2016 0337   VLDL UNABLE TO CALCULATE IF TRIGLYCERIDE OVER 400 mg/dL 10/11/2016 0337   LDLCALC 92 08/04/2019 1359       Other studies Reviewed: Additional studies/ records that were reviewed today include: . Echo--TEE 01/12/19 Study Conclusions  - Left ventricle: Systolic function was normal. The estimated   ejection fraction was in the range of 55% to 60%. Wall motion was   normal; there were no regional wall motion abnormalities. - Aortic valve: There was mild regurgitation. - Mitral valve: There was moderate regurgitation. - Left atrium: The atrium was dilated. No evidence of thrombus in   the atrial cavity or appendage. No evidence of thrombus in the   atrial cavity or appendage. No evidence of thrombus in the atrial   cavity or appendage. - Right atrium: The atrium was dilated. No evidence of thrombus in   the atrial cavity or appendage. - Atrial septum: No defect or patent foramen ovale was identified. - Tricuspid valve: There was moderate  regurgitation.  Impressions:  - No cardiac source of emboli was indentified. The study was   followed by a successful cardioversion.  TTE 10/05/17 Study Conclusions  - Left ventricle: The cavity size was normal. Wall thickness was   increased in a pattern of mild LVH. Systolic function was normal.   The estimated ejection fraction was in the range of 55% to 60%.   Wall motion was normal; there were no regional wall motion   abnormalities. - Left atrium: The atrium was mildly dilated.  Impressions:  - Normal LV function; mild LVH; mild LAE.  ASSESSMENT AND PLAN:  1.  Persistent a fib, despite flecainide - though rate control.  QRS 0.93 stable. I did review with Dr. Rayann Heman.  Plan will be to do DCCV and if successful will do POET post to eval for exercise arrhythmias, widening QRS.   If it is not successful or does not maintain SR then plan is for EP eval for a fib ablation.  Pt does not tolerate a fib. Follow up post DCCV  2. Marland KitchenHTN controlled with current meds, will continue.  3.  HLD continue statin.        Current medicines are reviewed with the patient today.  The patient Has no concerns regarding medicines.  The following changes have been made:  See above Labs/ tests ordered today include:see above  Disposition:   FU:  see above  Signed, Cecilie Kicks, NP  09/27/2019 4:51 PM    [facility] [place] [place], [place] Phone: [phone]; Fax: [phone]

## 2019-09-27 ENCOUNTER — Other Ambulatory Visit (HOSPITAL_COMMUNITY)
Admission: RE | Admit: 2019-09-27 | Discharge: 2019-09-27 | Disposition: A | Discharge status: Home / Self Care | Payer: Medicare HMO | Source: Ambulatory Visit | Attending: Cardiology | Admitting: Cardiology

## 2019-09-27 ENCOUNTER — Encounter: Payer: Self-pay | Admitting: Cardiology

## 2019-09-27 DIAGNOSIS — Z01812 Encounter for preprocedural laboratory examination: Secondary | ICD-10-CM | POA: Diagnosis not present

## 2019-09-27 DIAGNOSIS — Z20828 Contact with and (suspected) exposure to other viral communicable diseases: Secondary | ICD-10-CM | POA: Insufficient documentation

## 2019-09-27 LAB — CBC
Hematocrit: 40.8 % (ref 37.5–51.0)
Hemoglobin: 14.1 g/dL (ref 13.0–17.7)
MCH: 34.4 pg — ABNORMAL HIGH (ref 26.6–33.0)
MCHC: 34.6 g/dL (ref 31.5–35.7)
MCV: 100 fL — ABNORMAL HIGH (ref 79–97)
Platelets: 243 10*3/uL (ref 150–450)
RBC: 4.1 x10E6/uL — ABNORMAL LOW (ref 4.14–5.80)
RDW: 12.3 % (ref 11.6–15.4)
WBC: 7.3 10*3/uL (ref 3.4–10.8)

## 2019-09-27 LAB — BASIC METABOLIC PANEL
BUN/Creatinine Ratio: 13 (ref 10–24)
BUN: 13 mg/dL (ref 8–27)
CO2: 26 mmol/L (ref 20–29)
Calcium: 9.8 mg/dL (ref 8.6–10.2)
Chloride: 97 mmol/L (ref 96–106)
Creatinine, Ser: 1.04 mg/dL (ref 0.76–1.27)
GFR calc Af Amer: 81 mL/min/{1.73_m2} (ref >59)
GFR calc non Af Amer: 70 mL/min/{1.73_m2} (ref >59)
Glucose: 108 mg/dL — ABNORMAL HIGH (ref 65–99)
Potassium: 3.7 mmol/L (ref 3.5–5.2)
Sodium: 139 mmol/L (ref 134–144)

## 2019-09-28 LAB — NOVEL CORONAVIRUS, NAA (HOSP ORDER, SEND-OUT TO REF LAB; TAT 18-24 HRS): SARS-CoV-2, NAA: NOT DETECTED

## 2019-09-29 NOTE — Progress Notes (Signed)
Called patient for pre op Covid screening. Spoke with wife as patient was still in bed, he has been quarantined.  Will arrive at 8:30, all questions answered.

## 2019-09-30 ENCOUNTER — Encounter (HOSPITAL_COMMUNITY): Payer: Self-pay | Admitting: *Deleted

## 2019-09-30 ENCOUNTER — Ambulatory Visit (HOSPITAL_COMMUNITY)
Admission: RE | Admit: 2019-09-30 | Discharge: 2019-09-30 | Disposition: A | Discharge status: Home / Self Care | Payer: Medicare HMO | Attending: Cardiology | Admitting: Cardiology

## 2019-09-30 ENCOUNTER — Encounter (HOSPITAL_COMMUNITY)
Admission: RE | Disposition: A | Discharge status: Home / Self Care | Payer: Self-pay | Source: Home / Self Care | Attending: Cardiology

## 2019-09-30 ENCOUNTER — Ambulatory Visit (HOSPITAL_COMMUNITY): Payer: Medicare HMO | Admitting: Certified Registered Nurse Anesthetist

## 2019-09-30 DIAGNOSIS — Z7901 Long term (current) use of anticoagulants: Secondary | ICD-10-CM | POA: Diagnosis not present

## 2019-09-30 DIAGNOSIS — E079 Disorder of thyroid, unspecified: Secondary | ICD-10-CM | POA: Insufficient documentation

## 2019-09-30 DIAGNOSIS — K5790 Diverticulosis of intestine, part unspecified, without perforation or abscess without bleeding: Secondary | ICD-10-CM | POA: Diagnosis not present

## 2019-09-30 DIAGNOSIS — I4819 Other persistent atrial fibrillation: Secondary | ICD-10-CM

## 2019-09-30 DIAGNOSIS — E785 Hyperlipidemia, unspecified: Secondary | ICD-10-CM | POA: Insufficient documentation

## 2019-09-30 DIAGNOSIS — Z1159 Encounter for screening for other viral diseases: Secondary | ICD-10-CM | POA: Diagnosis not present

## 2019-09-30 DIAGNOSIS — Z87891 Personal history of nicotine dependence: Secondary | ICD-10-CM | POA: Diagnosis not present

## 2019-09-30 DIAGNOSIS — Z79890 Hormone replacement therapy (postmenopausal): Secondary | ICD-10-CM | POA: Diagnosis not present

## 2019-09-30 DIAGNOSIS — I4891 Unspecified atrial fibrillation: Secondary | ICD-10-CM | POA: Diagnosis not present

## 2019-09-30 DIAGNOSIS — K219 Gastro-esophageal reflux disease without esophagitis: Secondary | ICD-10-CM | POA: Insufficient documentation

## 2019-09-30 DIAGNOSIS — I10 Essential (primary) hypertension: Secondary | ICD-10-CM | POA: Insufficient documentation

## 2019-09-30 DIAGNOSIS — Z79899 Other long term (current) drug therapy: Secondary | ICD-10-CM | POA: Insufficient documentation

## 2019-09-30 DIAGNOSIS — E039 Hypothyroidism, unspecified: Secondary | ICD-10-CM | POA: Insufficient documentation

## 2019-09-30 DIAGNOSIS — M1990 Unspecified osteoarthritis, unspecified site: Secondary | ICD-10-CM | POA: Diagnosis not present

## 2019-09-30 HISTORY — PX: CARDIOVERSION: SHX1299

## 2019-09-30 SURGERY — CARDIOVERSION
Anesthesia: General

## 2019-09-30 MED ORDER — PROPOFOL 10 MG/ML IV BOLUS
INTRAVENOUS | Status: DC | PRN
  Administered 2019-09-30: 40 mg via INTRAVENOUS
  Administered 2019-09-30: 60 mg via INTRAVENOUS

## 2019-09-30 MED ORDER — LIDOCAINE 2% (20 MG/ML) 5 ML SYRINGE
INTRAMUSCULAR | Status: DC | PRN
  Administered 2019-09-30: 20 mg via INTRAVENOUS

## 2019-09-30 MED ORDER — SODIUM CHLORIDE 0.9 % IV SOLN
INTRAVENOUS | Status: DC
  Administered 2019-09-30: 09:00:00 via INTRAVENOUS

## 2019-09-30 NOTE — Interval H&P Note (Signed)
History and Physical Interval Note:  09/30/2019 9:06 AM  Eugene White  has presented today for surgery, with the diagnosis of A-FIB.  The various methods of treatment have been discussed with the patient and family. After consideration of risks, benefits and other options for treatment, the patient has consented to  Procedure(s): CARDIOVERSION (N/A) as a surgical intervention.  The patient's history has been reviewed, patient examined, no change in status, stable for surgery.  I have reviewed the patient's chart and labs.  Questions were answered to the patient's satisfaction.     [name]

## 2019-09-30 NOTE — Anesthesia Preprocedure Evaluation (Signed)
Anesthesia Evaluation  Patient identified by MRN, date of birth, ID band Patient awake    Reviewed: Allergy & Precautions, NPO status , Patient's Chart, lab work & pertinent test results  Airway Mallampati: II  TM Distance: >3 FB Neck ROM: Full    Dental  (+) Edentulous Upper, Edentulous Lower   Pulmonary former smoker,  09/27/2019 SARS Coronavirus NEG   breath sounds clear to auscultation       Cardiovascular hypertension, Pt. on medications and Pt. on home beta blockers + dysrhythmias Atrial Fibrillation  Rhythm:Irregular Rate:Normal  '19 ECHO: EF 55-60%, mild AI, mod MR, mod TR   Neuro/Psych Depression Facial nerve palsy since birth    GI/Hepatic Neg liver ROS, GERD  Controlled,  Endo/Other  Hypothyroidism   Renal/GU negative Renal ROS     Musculoskeletal  (+) Arthritis ,   Abdominal   Peds  Hematology eliquis   Anesthesia Other Findings   Reproductive/Obstetrics                             Anesthesia Physical Anesthesia Plan  ASA: III  Anesthesia Plan: General   Post-op Pain Management:    Induction: Intravenous  PONV Risk Score and Plan: 2 and Treatment may vary due to age or medical condition  Airway Management Planned: Natural Airway and Mask  Additional Equipment:   Intra-op Plan:   Post-operative Plan:   Informed Consent: I have reviewed the patients History and Physical, chart, labs and discussed the procedure including the risks, benefits and alternatives for the proposed anesthesia with the patient or authorized representative who has indicated his/her understanding and acceptance.       Plan Discussed with: CRNA and Surgeon  Anesthesia Plan Comments:         Anesthesia Quick Evaluation

## 2019-09-30 NOTE — Procedures (Signed)
Electrical Cardioversion Procedure Note Eugene White [id number] 1946/05/11  Procedure: Electrical Cardioversion Indications:  Atrial Fibrillation  Procedure Details Consent: Risks of procedure as well as the alternatives and risks of each were explained to the (patient/caregiver).  Consent for procedure obtained. Time Out: Verified patient identification, verified procedure, site/side was marked, verified correct patient position, special equipment/implants available, medications/allergies/relevent history reviewed, required imaging and test results available.  Performed  Patient placed on cardiac monitor, pulse oximetry, supplemental oxygen as necessary.  Sedation given: Pt sedated by anesthesia with diprovan 100 mg IV. Pacer pads placed anterior and posterior chest.  Cardioverted 1 time(s).  Cardioverted at 120J.  Evaluation Findings: Post procedure EKG shows: NSR Complications: None Patient did tolerate procedure well.   [name] 09/30/2019, 9:04 AM

## 2019-09-30 NOTE — Discharge Instructions (Signed)

## 2019-09-30 NOTE — H&P (Signed)
Office Visit   Go to Cards  09/26/2019  [facility]            Lomira, Otilio Carpen, NP   Cardiology        Persistent atrial fibrillation [facility]) +2 more   Dx        Atrial Fibrillation ; Referred by Chrismon, Vickki Muff, PA   Reason for Visit        Additional Documentation   Vitals:       BP 124/86       Pulse 65       Ht 5\' 11"  (1.803 m)       Wt 99.7 kg       SpO2 90%       BMI 30.66 kg/m       BSA 2.23 m             More Vitals    Flowsheets:        NEWS,       MEWS Score,       Anthropometrics,       Method of Visit     Encounter Info:        Billing Info,       History,       Allergies,       Detailed Report            All Notes      Progress Notes by Isaiah Serge, NP at 09/26/2019 2:00 PM   Author: Isaiah Serge, NP Author Type: Nurse Practitioner Filed: 09/27/2019  5:08 PM  Note Status: Signed Cosign: Cosign Not Required Encounter Date: 09/26/2019  Editor: Isaiah Serge, NP (Nurse Practitioner)      Expand All Collapse All           untitled image      Cardiology Office Note        Date:  09/27/2019      ID:  JAHAAD REPPERT, DOB 12-08-1946, MRN [id number]     PCP:  [name], PA            Cardiologist:  Dr. [name]           Chief Complaint    Patient presents with    .   Atrial Fibrillation            History of Present Illness:  Eugene White is a 73 y.o. male who presents for eval EKG on flecainide and plan DCCV.     Pt is a Marketing executive, with with history of PAF on Eliquis status post cardioversion 12/2018, hypertension, HLD.     Patient was last seen in our office 02/10/2019 at which time he felt like he was going in and out of atrial fibrillation.  Had broken his ankle and felt like he had brief episodes of A. fib which usually happens  when he is under stress.  Rate was controlled with metoprolol.  dronederone was discussed but patient did not want to add any medications.  She also is just in the cardia app for his smart phone so he could check his rhythm if he has palpitations.  He was feeling his heart beat to be irregular at the office visit but he was in normal sinus rhythm with PACs.  Blood pressure was running high so valsartan was increased.     09/19/2019 -patient states that he has been more tired, and he lacks any energy,  and has been experiencing falls that started this summer.  The falls are related to poor balance that got worse after he broke his ankle in February and now he trips over different objects.  He denies any lower extremity edema he does not have exertional chest pain just lack of energy or motivation.     On that  last visit flecainide 50 mg BID was started.  Here for EKG. If a fib then plan DCCV.  Also amlodipine was increased.  HLD stable. Today pt is still in a fib rate controlled at rest.  He still has fatigue and cannot due his usual activities.  He would like to schedule DCCV.  Discussed risks and he has had 2 prior ones. He has not missed any eliquis.  No angina.               Past Medical History:    Diagnosis   Date    .   A-fib ([place])        .   Adult hypothyroidism   05/31/2009    .   Anemia            hx of one time    .   Arthritis            OA    .   Atrial fibrillation with controlled ventricular rate ([place])   10/05/2017    .   Benign essential HTN   05/31/2009    .   Closed intertrochanteric fracture of hip, left, initial encounter ([place])   10/10/2016    .   Colon, diverticulosis   05/31/2009    .   Difficulty sleeping        .   Diverticulitis large intestine   05/31/2009    .   Diverticulosis   2014    .   Dysrhythmia            atrial fib     .   Essential (primary) hypertension   05/31/2009     .   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   GERD (gastroesophageal reflux disease)        .   GI symptom            had nausea / vomited once / frequent stools / getting better    .   Hyperlipidemia        .   Hypertension        .   Hypothyroidism        .   Mood changes        .   Pneumonia            hx of    .   Thyroid disease            hypo               Past Surgical History:    Procedure   Laterality   Date    .   CARDIOVERSION   N/A   01/12/2018        Procedure: CARDIOVERSION;  Surgeon: Dorothy Spark, MD;  Location: [facility] ENDOSCOPY;  Service: Cardiovascular;  Laterality: N/A;    .   CARDIOVERSION   N/A   01/26/2019        Procedure: CARDIOVERSION;  Surgeon: Jerline Pain, MD;  Location: [facility]  ENDOSCOPY;  Service: Cardiovascular;  Laterality: N/A;    .   COLONOSCOPY       2014    .   FEMUR IM NAIL   Left   10/11/2016        Procedure: INTRAMEDULLARY (IM) NAIL FEMORAL;  Surgeon: [name], MD;  Location: [facility] ORS;  Service: Orthopedics;  Laterality: Left;    .   HAND SURGERY        1973 / 2010 / 2015        right to release tendons    .   HARDWARE REMOVAL   Left   10/05/2017        Procedure: Removal of left femoral nail;  Surgeon: [name], MD;  Location: [facility] ORS;  Service: Orthopedics;  Laterality: Left;  90 mins    .   HEMATOMA EVACUATION   Left   01/19/2018        Procedure: Irrigation and debridement, Evacuation of left total knee hematoma;  Surgeon: [name], MD;  Location: [facility] ORS;  Service: Orthopedics;  Laterality: Left;    .   KNEE ARTHROSCOPY       1984 & 2001        twice    .   NASAL SEPTUM SURGERY            .   removal Left femoral nail                 10/05/17 Dr. [name]    .   [place] & 2006        Right and left    .   TEE WITH CARDIOVERSION       01/12/2018    .   TEE WITHOUT CARDIOVERSION   N/A   01/12/2018        Procedure: TRANSESOPHAGEAL ECHOCARDIOGRAM (TEE);  Surgeon: Dorothy Spark, MD;  Location: [facility] ENDOSCOPY;  Service: Cardiovascular;  Laterality: N/A;    .   TONSILLECTOMY            .   TOTAL KNEE ARTHROPLASTY   Right   01/09/2015        Procedure: RIGHT TOTAL KNEE ARTHROPLASTY;  Surgeon: [name], MD;  Location: [facility] ORS;  Service: Orthopedics;  Laterality: Right;    .   TOTAL KNEE ARTHROPLASTY   Left   11/23/2017        Procedure: LEFT TOTAL KNEE ARTHROPLASTY;  Surgeon: [name], MD;  Location: [facility] ORS;  Service: Orthopedics;  Laterality: Left;  90 mins                   Current Outpatient Medications    Medication   Sig   Dispense   Refill    .   acetaminophen (TYLENOL) 500 MG tablet   Take 1,500 mg by mouth at bedtime as needed for mild pain or moderate pain.             Marland Kitchen   amLODipine (NORVASC) 10 MG tablet   Take 1 tablet (10 mg total) by mouth daily. (Patient taking differently: Take 10 mg by mouth daily. Noon)   90 tablet   3    .   atorvastatin (LIPITOR) 20 MG tablet   Take 1 tablet (20 mg total) by mouth every evening. (Patient taking differently: Take 20 mg by mouth daily. )   90 tablet   4    .  Cholecalciferol (VITAMIN D) 2000 UNITS CAPS   Take 2,000 Units by mouth every evening.             Marland Kitchen   ELIQUIS 5 MG TABS tablet   TAKE 1 TABLET (5 MG TOTAL) BY MOUTH 2 (TWO) TIMES DAILY. (Patient taking differently: Take 5 mg by mouth 2 (two) times daily. )   180 tablet   2    .   flecainide (TAMBOCOR) 50 MG tablet   Take 1 tablet (50 mg total) by mouth 2 (two) times daily.   180 tablet   3    .   levothyroxine (SYNTHROID, LEVOTHROID) 150 MCG tablet   TAKE 1 TABLET EVERY DAY (Patient taking differently: Take  150 mcg by mouth daily before breakfast. )   90 tablet   4    .   metoprolol tartrate (LOPRESSOR) 50 MG tablet   TAKE 1 TABLET TWICE DAILY (Patient taking differently: Take 50 mg by mouth 2 (two) times daily. )   180 tablet   3    .   Multiple Vitamin (MULTIVITAMIN WITH MINERALS) TABS tablet   Take 1 tablet by mouth daily.             .   Multiple Vitamins-Minerals (PRESERVISION AREDS 2) CAPS   Take 1 capsule by mouth 2 (two) times daily.             .   pantoprazole (PROTONIX) 40 MG tablet   TAKE 1 TABLET EVERY DAY (Patient taking differently: Take 40 mg by mouth daily. )   90 tablet   4    .   polycarbophil (FIBERCON) 625 MG tablet   Take 625 mg by mouth daily.             .   valsartan-hydrochlorothiazide (DIOVAN-HCT) 320-25 MG tablet   Take 1 tablet by mouth daily.   90 tablet   3    .   venlafaxine XR (EFFEXOR-XR) 75 MG 24 hr capsule   TAKE 1 CAPSULE EVERY DAY (Patient taking differently: Take 75 mg by mouth daily with breakfast. )   90 capsule   3    .   ibuprofen (ADVIL) 200 MG tablet   Take 800 mg by mouth daily as needed for moderate pain.            .   Menthol, Topical Analgesic, (BLUE-EMU MAXIMUM STRENGTH EX)   Apply 1 application topically at bedtime as needed (pain).                No current facility-administered medications for this visit.          Allergies:   Sulfa antibiotics         Social History:  The patient  reports that he quit smoking about 50 years ago. He has never used smokeless tobacco. He reports current alcohol use of about 12.0 standard drinks of alcohol per week. He reports that he does not use drugs.      Family History:  The patient's family history includes Breast cancer in his mother.         ROS:  General:no colds or fevers, no weight changes  Skin:no rashes or ulcers  HEENT:no blurred vision, no congestion CV:see HPI PUL:see HPI GI:no  diarrhea constipation or melena, no indigestion GU:no hematuria, no dysuria MS:no joint pain, no claudication Neuro:no syncope, no lightheadedness  Endo:no diabetes, + thyroid disease         Wt Readings  from Last 3 Encounters:    09/26/19   219 lb 12.8 oz (99.7 kg)    09/19/19   218 lb 6.4 oz (99.1 kg)    04/19/19   220 lb (99.8 kg)          PHYSICAL EXAM:  VS:  BP 124/86   Pulse 65   Ht 5\' 11"  (1.803 m)   Wt 219 lb 12.8 oz (99.7 kg)   SpO2 90%   BMI 30.66 kg/m  , BMI Body mass index is 30.66 kg/m.  General:Pleasant affect, NAD  Skin:Warm and dry, brisk capillary refill  HEENT:normocephalic, sclera clear, mucus membranes moist  Neck:supple, no JVD, no bruits   Heart:irreg irreg without murmur, gallup, rub or click  Lungs:clear without rales, rhonchi, or wheezes  [id number], non tender, + BS, do not palpate liver spleen or masses  Ext:no lower ext edema, 2+ pedal pulses, 2+ radial pulses  Neuro:alert and oriented X 3, MAE, follows commands, + facial symmetry           EKG:  EKG is ordered today.  The ekg ordered today demonstrates atrial fib low voltage QRS septal infarct and QRS 96 ms up from 76 ms.  HR 65.         Recent Labs:  08/04/2019: ALT 35; TSH 1.470  09/26/2019: BUN 13; Creatinine, Ser 1.04; Hemoglobin 14.1; Platelets 243; Potassium 3.7; Sodium 139         Lipid Panel  Labs (Brief)                                                                                                                                Other studies Reviewed:  Additional studies/ records that were reviewed today include: .  Echo--TEE 01/12/19  Study Conclusions     - Left ventricle: Systolic function was normal. The estimated    ejection fraction was in the range of 55% to 60%. Wall motion was    normal; there were no regional wall motion  abnormalities.  - Aortic valve: There was mild regurgitation.  - Mitral valve: There was moderate regurgitation.  - Left atrium: The atrium was dilated. No evidence of thrombus in    the atrial cavity or appendage. No evidence of thrombus in the    atrial cavity or appendage. No evidence of thrombus in the atrial    cavity or appendage.  - Right atrium: The atrium was dilated. No evidence of thrombus in    the atrial cavity or appendage.  - Atrial septum: No defect or patent foramen ovale was identified.  - Tricuspid valve: There was moderate regurgitation.     Impressions:     - No cardiac source of emboli was indentified. The study was    followed by a successful cardioversion.     TTE 10/05/17  Study Conclusions     - Left ventricle: The cavity size was normal. Wall thickness was    increased  in a pattern of mild LVH. Systolic function was normal.    The estimated ejection fraction was in the range of 55% to 60%.    Wall motion was normal; there were no regional wall motion    abnormalities.  - Left atrium: The atrium was mildly dilated.     Impressions:     - Normal LV function; mild LVH; mild LAE.     ASSESSMENT AND PLAN:     1.  Persistent a fib, despite flecainide - though rate control.  QRS 0.93 stable. I did review with Dr. Rayann Heman.  Plan will be to do DCCV and if successful will do POET post to eval for exercise arrhythmias, widening QRS.   If it is not successful or does not maintain SR then plan is for EP eval for a fib ablation.  Pt does not tolerate a fib. Follow up post DCCV     2. Marland KitchenHTN controlled with current meds, will continue.     3.  HLD continue statin.             Current medicines are reviewed with the patient today.  The patient Has no concerns regarding medicines.     The following changes have been made:  See above  Labs/ tests ordered today include:see above     Disposition:   FU:  see above      Signed,  Cecilie Kicks, NP   09/27/2019 4:51 PM     [facility]  [place], [place]  [place], [place]  Phone: [phone]; Fax: [phone]    For DCCV, compliant with apixaban, no changes. [name]

## 2019-09-30 NOTE — Anesthesia Postprocedure Evaluation (Signed)
Anesthesia Post Note  Patient: Eugene White  Procedure(s) Performed: CARDIOVERSION (N/A )     Patient location during evaluation: Endoscopy Anesthesia Type: General Level of consciousness: awake and alert, patient cooperative and oriented Pain management: pain level controlled Vital Signs Assessment: post-procedure vital signs reviewed and stable Respiratory status: spontaneous breathing, nonlabored ventilation and respiratory function stable Cardiovascular status: blood pressure returned to baseline and stable Postop Assessment: no apparent nausea or vomiting and able to ambulate Anesthetic complications: no    Last Vitals:  Vitals:   09/30/19 0906 09/30/19 0953  BP: (!) 151/99 121/70  Pulse: (!) 57   Resp: 12 (!) 23  Temp: 36.5 C   SpO2: 100% 94%    Last Pain:  Vitals:   09/30/19 0953  TempSrc:   PainSc: 0-No pain                 Kesi Perrow,E. Gavino Fouch

## 2019-09-30 NOTE — Transfer of Care (Signed)
Immediate Anesthesia Transfer of Care Note  Patient: Eugene White  Procedure(s) Performed: CARDIOVERSION (N/A )  Patient Location: Endoscopy Unit  Anesthesia Type:General  Level of Consciousness: drowsy and patient cooperative  Airway & Oxygen Therapy: Patient Spontanous Breathing  Post-op Assessment: Report given to RN, Post -op Vital signs reviewed and stable and Patient moving all extremities X 4  Post vital signs: Reviewed and stable  Last Vitals:  Vitals Value Taken Time  BP    Temp    Pulse    Resp    SpO2      Last Pain:  Vitals:   09/30/19 0906  TempSrc: Oral  PainSc: 0-No pain         Complications: No apparent anesthesia complications

## 2019-10-03 ENCOUNTER — Encounter (HOSPITAL_COMMUNITY): Payer: Self-pay | Admitting: Cardiology

## 2019-10-10 NOTE — Progress Notes (Addendum)
Subjective:   Eugene White is a 73 y.o. male who presents for Medicare Annual/Subsequent preventive examination.    This visit is being conducted through telemedicine due to the COVID-19 pandemic. This patient has given me verbal consent via doximity to conduct this visit, patient states they are participating from their home address. Some vital signs may be absent or patient reported.    Patient identification: identified by name, DOB, and current address  Review of Systems:  N/A  Cardiac Risk Factors include: advanced age (>31men, >63 women);dyslipidemia;hypertension;male gender;sedentary lifestyle     Objective:    Vitals: There were no vitals taken for this visit.  There is no height or weight on file to calculate BMI. Unable to obtain vitals due to visit being conducted via telephonically.   Advanced Directives 09/30/2019 01/26/2019 10/08/2018 01/19/2018 01/18/2018 01/12/2018 11/23/2017  Does Patient Have a Medical Advance Directive? Yes Yes Yes Yes No Yes Yes  Type of Paramedic of [place];Living will [place];Living will [place];Living will - [place];Living will Living will;Healthcare Power of Attorney  Does patient want to make changes to medical advance directive? - - - No - Patient declined - - No - Patient declined  Copy of [place] in Chart? No - copy requested Yes - validated most recent copy scanned in chart (See row information) Yes No - copy requested No - copy requested Yes No - copy requested  Would patient like information on creating a medical advance directive? - - - - - - -    Tobacco Social History   Tobacco Use  Smoking Status Former Smoker  . Quit date: 12/29/1968  . Years since quitting: 50.8  Smokeless Tobacco Never Used     Counseling given: Not Answered   Clinical Intake:  Pre-visit preparation completed: Yes   Pain : No/denies pain Pain Score: 0-No pain     Nutritional Risks: None Diabetes: No  How often do you need to have someone help you when you read instructions, pamphlets, or other written materials from your doctor or pharmacy?: 1 - Never  Interpreter Needed?: No  Information entered by :: [facility], LPN  Past Medical History:  Diagnosis Date  . A-fib ([place])   . 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 Anemia    hx of one time  . Arthritis    OA  . Atrial fibrillation with controlled ventricular rate ([place]) 10/05/2017  . Benign essential HTN 05/31/2009  . Closed intertrochanteric fracture of hip, left, initial encounter ([place]) 10/10/2016  . Colon, diverticulosis 05/31/2009  . Difficulty sleeping   . Diverticulitis large intestine 05/31/2009  . Diverticulosis 2014  . Dysrhythmia    atrial fib   . Essential (primary) hypertension 05/31/2009  . 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 GERD (gastroesophageal reflux disease)   . GI symptom    had nausea / vomited once / frequent stools / getting better  . Hyperlipidemia   . Hypertension   . Hypothyroidism   . Mood changes   . Pneumonia    hx of  . Thyroid disease    hypo   Past Surgical History:  Procedure Laterality Date  . CARDIOVERSION N/A 01/12/2018   Procedure: CARDIOVERSION;  Surgeon: Dorothy Spark, MD;  Location: [facility] ENDOSCOPY;  Service: Cardiovascular;  Laterality: N/A;  . CARDIOVERSION N/A 01/26/2019   Procedure: CARDIOVERSION;  Surgeon: [name]  C, MD;  Location: [place];  Service: Cardiovascular;  Laterality: N/A;  . CARDIOVERSION N/A 09/30/2019   Procedure: CARDIOVERSION;  Surgeon: Lelon Perla, MD;  Location[facility] ENDOSCOPY;  Service: Cardiovascular;  Laterality: N/A;  . COLONOSCOPY  2014  . FEMUR IM NAIL Left 10/11/2016   Procedure: INTRAMEDULLARY (IM) NAIL FEMORAL;  Surgeon: [name], MD;  Location: [facility] ORS;  Service: Orthopedics;  Laterality: Left;  . HAND SURGERY   1973 /  2010 / 2015   right to release tendons  . HARDWARE REMOVAL Left 10/05/2017   Procedure: Removal of left femoral nail;  Surgeon: [name], MD;  Location: [facility] ORS;  Service: Orthopedics;  Laterality: Left;  90 mins  . HEMATOMA EVACUATION Left 01/19/2018   Procedure: Irrigation and debridement, Evacuation of left total knee hematoma;  Surgeon: [name], MD;  Location: [facility] ORS;  Service: Orthopedics;  Laterality: Left;  . KNEE ARTHROSCOPY  1984 & 2001   twice  . NASAL SEPTUM SURGERY    . removal Left femoral nail      10/05/17 Dr. [name]  . [place] & 2006   Right and left  . TEE WITH CARDIOVERSION  01/12/2018  . TEE WITHOUT CARDIOVERSION N/A 01/12/2018   Procedure: TRANSESOPHAGEAL ECHOCARDIOGRAM (TEE);  Surgeon: Dorothy Spark, MD;  Location: [facility] ENDOSCOPY;  Service: Cardiovascular;  Laterality: N/A;  . TONSILLECTOMY    . TOTAL KNEE ARTHROPLASTY Right 01/09/2015   Procedure: RIGHT TOTAL KNEE ARTHROPLASTY;  Surgeon: [name], MD;  Location: [facility] ORS;  Service: Orthopedics;  Laterality: Right;  . TOTAL KNEE ARTHROPLASTY Left 11/23/2017   Procedure: LEFT TOTAL KNEE ARTHROPLASTY;  Surgeon: [name], MD;  Location: [facility] ORS;  Service: Orthopedics;  Laterality: Left;  90 mins   Family History  Problem Relation Age of Onset  . Breast cancer Mother   . Colon cancer Neg Hx   . Esophageal cancer Neg Hx   . Stomach cancer Neg Hx   . Rectal cancer Neg Hx    Social History   Socioeconomic History  . Marital status: Married    Spouse name: Not on file  . Number of children: 2  . Years of education: Not on file  . Highest education level: Professional school degree (e.g., MD, DDS, DVM, JD)  Occupational History  . Occupation: retired, has degree in physiology  Social Needs  . Financial resource strain: Not hard at all  . Food insecurity    Worry: Never true    Inability: Never true  . Transportation needs    Medical: No    Non-medical: No   Tobacco Use  . Smoking status: Former Smoker    Quit date: 12/29/1968    Years since quitting: 50.8  . Smokeless tobacco: Never Used  Substance and Sexual Activity  . Alcohol use: Yes    Alcohol/week: 12.0 standard drinks    Types: 12 Cans of beer per week    Comment: weekly  . Drug use: No  . Sexual activity: Yes  Lifestyle  . Physical activity    Days per week: 0 days    Minutes per session: 0 min  . Stress: Not at all  Relationships  . Social Herbalist on phone: Patient refused    Gets together: Patient refused    Attends religious service: Patient refused    Active member of club or organization: Patient refused    Attends meetings of clubs or organizations: Patient  refused    Relationship status: Patient refused  Other Topics Concern  . Not on file  Social History Narrative   Pt lives in [place] w/ wife.    Outpatient Encounter Medications as of 10/11/2019  Medication Sig  . acetaminophen (TYLENOL) 500 MG tablet Take 1,500 mg by mouth at bedtime as needed for mild pain or moderate pain.   Marland Kitchen amLODipine (NORVASC) 10 MG tablet Take 1 tablet (10 mg total) by mouth daily. (Patient taking differently: Take 10 mg by mouth daily. Noon)  . atorvastatin (LIPITOR) 20 MG tablet Take 1 tablet (20 mg total) by mouth every evening. (Patient taking differently: Take 20 mg by mouth daily. )  . Cholecalciferol (VITAMIN D) 2000 UNITS CAPS Take 2,000 Units by mouth every evening.   Marland Kitchen ELIQUIS 5 MG TABS tablet TAKE 1 TABLET (5 MG TOTAL) BY MOUTH 2 (TWO) TIMES DAILY. (Patient taking differently: Take 5 mg by mouth 2 (two) times daily. )  . flecainide (TAMBOCOR) 50 MG tablet Take 1 tablet (50 mg total) by mouth 2 (two) times daily.  Marland Kitchen ibuprofen (ADVIL) 200 MG tablet Take 800 mg by mouth daily as needed for moderate pain.  Marland Kitchen levothyroxine (SYNTHROID, LEVOTHROID) 150 MCG tablet TAKE 1 TABLET EVERY DAY (Patient taking differently: Take 150 mcg by mouth daily before breakfast. )  .  Menthol, Topical Analgesic, (BLUE-EMU MAXIMUM STRENGTH EX) Apply 1 application topically at bedtime as needed (pain).  . metoprolol tartrate (LOPRESSOR) 50 MG tablet TAKE 1 TABLET TWICE DAILY (Patient taking differently: Take 50 mg by mouth 2 (two) times daily. )  . Multiple Vitamin (MULTIVITAMIN WITH MINERALS) TABS tablet Take 1 tablet by mouth daily.   . Multiple Vitamins-Minerals (PRESERVISION AREDS 2) CAPS Take 1 capsule by mouth 2 (two) times daily.   . pantoprazole (PROTONIX) 40 MG tablet TAKE 1 TABLET EVERY DAY (Patient taking differently: Take 40 mg by mouth daily. )  . polycarbophil (FIBERCON) 625 MG tablet Take 625 mg by mouth daily.   . valsartan-hydrochlorothiazide (DIOVAN-HCT) 320-25 MG tablet Take 1 tablet by mouth daily.  Marland Kitchen venlafaxine XR (EFFEXOR-XR) 75 MG 24 hr capsule TAKE 1 CAPSULE EVERY DAY (Patient taking differently: Take 75 mg by mouth daily with breakfast. )   No facility-administered encounter medications on file as of 10/11/2019.     Activities of Daily Living In your present state of health, do you have any difficulty performing the following activities: 10/11/2019  Hearing? N  Vision? N  Difficulty concentrating or making decisions? N  Walking or climbing stairs? Y  Comment Due to hip pain.  Dressing or bathing? N  Doing errands, shopping? N  Preparing Food and eating ? N  Using the Toilet? N  In the past six months, have you accidently leaked urine? N  Do you have problems with loss of bowel control? N  Managing your Medications? N  Managing your Finances? N  Housekeeping or managing your Housekeeping? N  Some recent data might be hidden    Patient Care Team: Chrismon, Vickki Muff, PA as PCP - General (Family Medicine) Dorothy Spark, MD as PCP - Cardiology (Cardiology) Dingeldein, Remo Lipps, MD as Consulting Physician (Ophthalmology) [name], MD as Consulting Physician (Orthopedic Surgery)   Assessment:   This is a routine wellness examination  for [place].  Exercise Activities and Dietary recommendations    Goals    . Exercise 3x per week (30 min per time)     Recommend to exercise for 3 days a week for  at least 30 minutes at a time.     . Increase water intake     Recommend to continue drinking 6-8 glasses of water a day.        Fall Risk: Fall Risk  10/11/2019 11/12/2018 10/11/2018 10/08/2018 09/15/2017  Falls in the past year? 1 1 Yes Yes Yes  Number falls in past yr: 1 1 2  or more 2 or more 2 or more  Injury with Fall? 1 0 No No Yes  Comment fractured fibulia - - - broken femur  Risk Factor Category  - - - High Fall Risk High Fall Risk  Risk for fall due to : Impaired mobility - - Impaired balance/gait;Impaired vision -  Risk for fall due to: Comment due to previous hip surgery - - - -  Follow up Falls prevention discussed - - - Falls prevention discussed  Comment Request for PT - - - -    FALL RISK PREVENTION PERTAINING TO THE HOME:  Any stairs in or around the home? Yes  If so, are there any without handrails? No   Home free of loose throw rugs in walkways, pet beds, electrical cords, etc? Yes  Adequate lighting in your home to reduce risk of falls? Yes   ASSISTIVE DEVICES UTILIZED TO PREVENT FALLS:  Life alert? No  Use of a cane, walker or w/c? Yes  Grab bars in the bathroom? No  Shower chair or bench in shower? No  Elevated toilet seat or a handicapped toilet? Yes   TIMED UP AND GO:  Was the test performed? No .    Depression Screen PHQ 2/9 Scores 10/11/2019 11/12/2018 10/11/2018 10/08/2018  PHQ - 2 Score 0 0 0 0  PHQ- 9 Score - - - 0    Cognitive Function: Declined today.        Immunization History  Administered Date(s) Administered  . Influenza, High Dose Seasonal PF 11/01/2015, 09/15/2017  . Influenza-Unspecified 09/28/2013, 10/07/2018  . Pneumococcal Conjugate-13 09/15/2017  . Pneumococcal Polysaccharide-23 01/07/2013  . Pneumococcal-Unspecified 04/28/2018  . Zoster 01/07/2013     Qualifies for Shingles Vaccine? Yes  Zostavax completed 01/07/13. Due for Shingrix. Education has been provided regarding the importance of this vaccine. Pt has been advised to call insurance company to determine out of pocket expense. Advised may also receive vaccine at local pharmacy or Health Dept. Verbalized acceptance and understanding.  Tdap: Up to date  Flu Vaccine: Due for Flu vaccine. Does the patient want to receive this vaccine today?  No . Pt to receive at flu clinic this afternoon.  Pneumococcal Vaccine: Completed series  Screening Tests Health Maintenance  Topic Date Due  . INFLUENZA VACCINE  07/30/2019  . Hepatitis C Screening  10/10/2020 (Originally [birth year])  . TETANUS/TDAP  01/11/2023  . COLONOSCOPY  02/18/2023  . PNA vac Low Risk Adult  Completed   Cancer Screenings:  Colorectal Screening: Completed 02/18/13. Repeat every 10 years.  Lung Cancer Screening: (Low Dose CT Chest recommended if Age 71-80 years, 30 pack-year currently smoking OR have quit w/in 15years.) does not qualify.   Additional Screening:  Hepatitis C Screening: does qualify; however declines future order at this time.   Vision Screening: Recommended annual ophthalmology exams for early detection of glaucoma and other disorders of the eye.  Dental Screening: Recommended annual dental exams for proper oral hygiene  Community Resource Referral:  CRR required this visit?  No        Plan:  I have personally reviewed and addressed the  Medicare Annual Wellness questionnaire and have noted the following in the patient's chart:  A. Medical and social history B. Use of alcohol, tobacco or illicit drugs  C. Current medications and supplements D. Functional ability and status E.  Nutritional status F.  Physical activity G. Advance directives H. List of other physicians I.  Hospitalizations, surgeries, and ER visits in previous 12 months J.  [place] such as hearing and vision if  needed, cognitive and depression L. Referrals and appointments   In addition, I have reviewed and discussed with patient certain preventive protocols, quality metrics, and best practice recommendations. A written personalized care plan for preventive services as well as general preventive health recommendations were provided to patient.   [place] Score, [place]  075-[facility]M Nurse Health Advisor   Nurse Notes: Pt to receive his flu shot at the flu clinic today. Declined the Hep C lab order. Message sent to inquire about PT for pt that is considered a high fall risk.   Reviewed screening note of Nurse Health Advisor. Was available for consultation. Agree with documentation and recommendations.

## 2019-10-11 ENCOUNTER — Telehealth: Payer: Self-pay

## 2019-10-11 ENCOUNTER — Ambulatory Visit (INDEPENDENT_AMBULATORY_CARE_PROVIDER_SITE_OTHER): Payer: Medicare HMO

## 2019-10-11 ENCOUNTER — Other Ambulatory Visit: Payer: Self-pay

## 2019-10-11 DIAGNOSIS — Z23 Encounter for immunization: Secondary | ICD-10-CM

## 2019-10-11 DIAGNOSIS — Z9181 History of falling: Secondary | ICD-10-CM

## 2019-10-11 DIAGNOSIS — Z0000 Encounter for general adult medical examination without abnormal findings: Secondary | ICD-10-CM

## 2019-10-11 DIAGNOSIS — R2689 Other abnormalities of gait and mobility: Secondary | ICD-10-CM

## 2019-10-11 NOTE — Telephone Encounter (Signed)
Pt states the fracture occurred 02/01/19. Pt was treated by Dr Sabra Heck or Rosanne Gutting. PT ordered.

## 2019-10-11 NOTE — Progress Notes (Signed)
Cardiology Office Note    Date:  10/12/2019   ID:  Eugene White, DOB 02/11/1946, MRN [id number]  PCP:  [name], PA  Cardiologist: Ena Dawley, MD EPS: None  Chief Complaint  Patient presents with  . 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   Patient was seen in our office 02/10/2019 at which time he felt like he was going in and out of atrial fibrillation.  Had broken his ankle and felt like he had brief episodes of A. fib which usually happens when he is under stress.  Rate was controlled with metoprolol.  dronederone was discussed but patient did not want to add any medications.  Patient back in Afib 09/19/19 and Dr. [name] started him on Flecainide.  amlodipine increased 10 mg for HTN. He underwent successful DCCV 09/30/19.  Patient comes in for f/u. Feels much better in NSR. Notices it especially at night-not as much activity.     Past Medical History:  Diagnosis Date  . A-fib ([place])   . 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 Anemia    hx of one time  . Arthritis    OA  . Atrial fibrillation with controlled ventricular rate ([place]) 10/05/2017  . Benign essential HTN 05/31/2009  . Closed intertrochanteric fracture of hip, left, initial encounter ([place]) 10/10/2016  . Colon, diverticulosis 05/31/2009  . Difficulty sleeping   . Diverticulitis large intestine 05/31/2009  . Diverticulosis 2014  . Dysrhythmia    atrial fib   . Essential (primary) hypertension 05/31/2009  . 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 GERD (gastroesophageal reflux disease)   . GI symptom    had nausea / vomited once / frequent stools / getting better  . Hyperlipidemia   . Hypertension   . Hypothyroidism   . Mood changes   . Pneumonia    hx of  . Thyroid disease    hypo    Past Surgical History:  Procedure  Laterality Date  . CARDIOVERSION N/A 01/12/2018   Procedure: CARDIOVERSION;  Surgeon: Dorothy Spark, MD;  Location: [facility] ENDOSCOPY;  Service: Cardiovascular;  Laterality: N/A;  . CARDIOVERSION N/A 01/26/2019   Procedure: CARDIOVERSION;  Surgeon: Jerline Pain, MD;  Location: [facility] ENDOSCOPY;  Service: Cardiovascular;  Laterality: N/A;  . CARDIOVERSION N/A 09/30/2019   Procedure: CARDIOVERSION;  Surgeon: Lelon Perla, MD;  Location: [facility] ENDOSCOPY;  Service: Cardiovascular;  Laterality: N/A;  . COLONOSCOPY  2014  . FEMUR IM NAIL Left 10/11/2016   Procedure: INTRAMEDULLARY (IM) NAIL FEMORAL;  Surgeon: [name], MD;  Location: [facility] ORS;  Service: Orthopedics;  Laterality: Left;  . HAND SURGERY   1973 / 2010 / 2015   right to release tendons  . HARDWARE REMOVAL Left 10/05/2017   Procedure: Removal of left femoral nail;  Surgeon: [name], MD;  Location: [facility] ORS;  Service: Orthopedics;  Laterality: Left;  90 mins  . HEMATOMA EVACUATION Left 01/19/2018   Procedure: Irrigation and debridement, Evacuation of left total knee hematoma;  Surgeon: [name], MD;  Location: [facility] ORS;  Service: Orthopedics;  Laterality: Left;  . KNEE ARTHROSCOPY  1984 & 2001   twice  . NASAL SEPTUM SURGERY    . removal Left femoral nail      10/05/17 Dr. [name]  . SHOULDER OPEN ROTATOR CUFF  REPAIR  1999 & 2006   Right and left  . TEE WITH CARDIOVERSION  01/12/2018  . TEE WITHOUT CARDIOVERSION N/A 01/12/2018   Procedure: TRANSESOPHAGEAL ECHOCARDIOGRAM (TEE);  Surgeon: Dorothy Spark, MD;  Location: [facility] ENDOSCOPY;  Service: Cardiovascular;  Laterality: N/A;  . TONSILLECTOMY    . TOTAL KNEE ARTHROPLASTY Right 01/09/2015   Procedure: RIGHT TOTAL KNEE ARTHROPLASTY;  Surgeon: [name], MD;  Location: [facility] ORS;  Service: Orthopedics;  Laterality: Right;  . TOTAL KNEE ARTHROPLASTY Left 11/23/2017   Procedure: LEFT TOTAL KNEE ARTHROPLASTY;  Surgeon: [name], MD;  Location: [facility] ORS;  Service: Orthopedics;   Laterality: Left;  90 mins    Current Medications: Current Meds  Medication Sig  . acetaminophen (TYLENOL) 500 MG tablet Take 1,500 mg by mouth at bedtime as needed for mild pain or moderate pain.   Marland Kitchen amLODipine (NORVASC) 10 MG tablet Take 1 tablet (10 mg total) by mouth daily.  Marland Kitchen atorvastatin (LIPITOR) 20 MG tablet Take 1 tablet (20 mg total) by mouth every evening.  . Cholecalciferol (VITAMIN D) 2000 UNITS CAPS Take 2,000 Units by mouth every evening.   Marland Kitchen ELIQUIS 5 MG TABS tablet TAKE 1 TABLET (5 MG TOTAL) BY MOUTH 2 (TWO) TIMES DAILY.  . flecainide (TAMBOCOR) 50 MG tablet Take 1 tablet (50 mg total) by mouth 2 (two) times daily.  Marland Kitchen ibuprofen (ADVIL) 200 MG tablet Take 800 mg by mouth daily as needed for moderate pain.  Marland Kitchen levothyroxine (SYNTHROID, LEVOTHROID) 150 MCG tablet TAKE 1 TABLET EVERY DAY  . Menthol, Topical Analgesic, (BLUE-EMU MAXIMUM STRENGTH EX) Apply 1 application topically at bedtime as needed (pain).  . metoprolol tartrate (LOPRESSOR) 50 MG tablet TAKE 1 TABLET TWICE DAILY  . Multiple Vitamin (MULTIVITAMIN WITH MINERALS) TABS tablet Take 1 tablet by mouth daily.   . Multiple Vitamins-Minerals (PRESERVISION AREDS 2) CAPS Take 1 capsule by mouth 2 (two) times daily.   . pantoprazole (PROTONIX) 40 MG tablet TAKE 1 TABLET EVERY DAY  . polycarbophil (FIBERCON) 625 MG tablet Take 625 mg by mouth daily.   . valsartan-hydrochlorothiazide (DIOVAN-HCT) 320-25 MG tablet Take 1 tablet by mouth daily.  Marland Kitchen venlafaxine XR (EFFEXOR-XR) 75 MG 24 hr capsule TAKE 1 CAPSULE EVERY DAY  . [DISCONTINUED] flecainide (TAMBOCOR) 50 MG tablet Take 1 tablet (50 mg total) by mouth 2 (two) times daily.     Allergies:   Sulfa antibiotics   Social History   Socioeconomic History  . Marital status: Married    Spouse name: Not on file  . Number of children: 2  . Years of education: Not on file  . Highest education level: Professional school degree (e.g., MD, DDS, DVM, JD)  Occupational History  .  Occupation: retired, has degree in physiology  Social Needs  . Financial resource strain: Not hard at all  . Food insecurity    Worry: Never true    Inability: Never true  . Transportation needs    Medical: No    Non-medical: No  Tobacco Use  . Smoking status: Former Smoker    Quit date: 12/29/1968    Years since quitting: 50.8  . Smokeless tobacco: Never Used  Substance and Sexual Activity  . Alcohol use: Yes    Alcohol/week: 12.0 standard drinks    Types: 12 Cans of beer per week    Comment: weekly  . Drug use: No  . Sexual activity: Yes  Lifestyle  . Physical activity    Days per week: 0 days  Minutes per session: 0 min  . Stress: Not at all  Relationships  . Social Herbalist on phone: Patient refused    Gets together: Patient refused    Attends religious service: Patient refused    Active member of club or organization: Patient refused    Attends meetings of clubs or organizations: Patient refused    Relationship status: Patient refused  Other Topics Concern  . Not on file  Social History Narrative   Pt lives in [place] w/ wife.     Family History:  The patient's family history includes Breast cancer in his mother.   ROS:   Please see the history of present illness.    ROS All other systems reviewed and are negative.   PHYSICAL EXAM:   VS:  BP 130/74   Pulse 72   Ht 5\' 11"  (1.803 m)   Wt 219 lb 12.8 oz (99.7 kg)   SpO2 97%   BMI 30.66 kg/m   Physical Exam  GEN: Well nourished, well developed, in no acute distress  Neck: no JVD, carotid bruits, or masses Cardiac:RRR; no murmurs, rubs, or gallops  Respiratory:  clear to auscultation bilaterally, normal work of breathing GI: soft, nontender, nondistended, + BS Ext: without cyanosis, clubbing, or edema, Good distal pulses bilaterally Neuro:  Alert and Oriented x 3 Psych: euthymic mood, full affect  Wt Readings from Last 3 Encounters:  10/12/19 219 lb 12.8 oz (99.7 kg)  09/30/19 219 lb  12.8 oz (99.7 kg)  09/26/19 219 lb 12.8 oz (99.7 kg)      Studies/Labs Reviewed:   EKG:  EKG is  ordered today.  The ekg ordered today demonstrates NSR with first degree AV block  Recent Labs: 08/04/2019: ALT 35; TSH 1.470 09/26/2019: BUN 13; Creatinine, Ser 1.04; Hemoglobin 14.1; Platelets 243; Potassium 3.7; Sodium 139   Lipid Panel    Component Value Date/Time   CHOL 185 08/04/2019 1359   TRIG 152 (H) 08/04/2019 1359   HDL 63 08/04/2019 1359   CHOLHDL 2.9 08/04/2019 1359   CHOLHDL 3.8 10/11/2016 0337   VLDL UNABLE TO CALCULATE IF TRIGLYCERIDE OVER 400 mg/dL 10/11/2016 0337   LDLCALC 92 08/04/2019 1359    Additional studies/ records that were reviewed today include:   TEE 1/2019Study Conclusions   - Left ventricle: Systolic function was normal. The estimated   ejection fraction was in the range of 55% to 60%. Wall motion was   normal; there were no regional wall motion abnormalities. - Aortic valve: There was mild regurgitation. - Mitral valve: There was moderate regurgitation. - Left atrium: The atrium was dilated. No evidence of thrombus in   the atrial cavity or appendage. No evidence of thrombus in the   atrial cavity or appendage. No evidence of thrombus in the atrial   cavity or appendage. - Right atrium: The atrium was dilated. No evidence of thrombus in   the atrial cavity or appendage. - Atrial septum: No defect or patent foramen ovale was identified. - Tricuspid valve: There was moderate regurgitation.   Impressions:   - No cardiac source of emboli was indentified. The study was   followed by a successful cardioversion.          ASSESSMENT:    1. Atrial fibrillation with controlled ventricular rate ([place])   2. Benign essential HTN   3. Pure hypercholesterolemia      PLAN:  In order of problems listed above:  PAF S/P successful DCCV 09/30/19 on Flecainide  and eliquis and metoprolol. In NSR today. F/u with Dr. [name] in 3-4 months.  Essential HTN  BP controlled  HLD on lipitor LDL 92 08/04/19    Medication Adjustments/Labs and Tests Ordered: Current medicines are reviewed at length with the patient today.  Concerns regarding medicines are outlined above.  Medication changes, Labs and Tests ordered today are listed in the Patient Instructions below. Patient Instructions  Medication Instructions:  Your physician recommends that you continue on your current medications as directed. Please refer to the Current Medication list given to you today.  If you need a refill on your cardiac medications before your next appointment, please call your pharmacy.   Lab work: None Ordered  If you have labs (blood work) drawn today and your tests are completely normal, you will receive your results only by: Marland Kitchen MyChart Message (if you have MyChart) OR . A paper copy in the mail If you have any lab test that is abnormal or we need to change your treatment, we will call you to review the results.  Testing/Procedures: None ordered  Follow-Up: At [facility], you and your health needs are our priority.  As part of our continuing mission to provide you with exceptional heart care, we have created designated Provider Care Teams.  These Care Teams include your primary Cardiologist (physician) and Advanced Practice Providers (APPs -  Physician Assistants and Nurse Practitioners) who all work together to provide you with the care you need, when you need it. . You will need a follow up appointment in 3-4 months.  Please call our office 2 months in advance to schedule this appointment.  You may see Ena Dawley, MD or one of the following Advanced Practice Providers on your designated Care Team:   . Melina Copa, PA-C . Ermalinda Barrios, PA-C  Any Other Special Instructions Will Be Listed Below (If Applicable).       Sumner Boast, PA-C  10/12/2019 11:50 AM    [facility] [place], [place], [place]  [address] Phone:  [phone]; Fax: [phone]

## 2019-10-11 NOTE — Patient Instructions (Signed)
Eugene White , Thank you for taking time to come for your Medicare Wellness Visit. I appreciate your ongoing commitment to your health goals. Please review the following plan we discussed and let me know if I can assist you in the future.   Screening recommendations/referrals: Colonoscopy: Up to date, due 01/2023 Recommended yearly ophthalmology/optometry visit for glaucoma screening and checkup Recommended yearly dental visit for hygiene and checkup  Vaccinations: Influenza vaccine: Receiving today at flu clinic Pneumococcal vaccine: Completed series Tdap vaccine: Up to date, due 12/2022 Shingles vaccine: Pt declines today.     Advanced directives: Currently on file.  Conditions/risks identified: Recommend to start exercising 3 days a week for a least 30 minutes at a time.   Next appointment: 4:50 PM flu clinic. Declined scheduling a CPE for this year or an [place] for 2021.   Preventive Care 74 Years and Older, Male Preventive care refers to lifestyle choices and visits with your health care provider that can promote health and wellness. What does preventive care include?  A yearly physical exam. This is also called an annual well check.  Dental exams once or twice a year.  Routine eye exams. Ask your health care provider how often you should have your eyes checked.  Personal lifestyle choices, including:  Daily care of your teeth and gums.  Regular physical activity.  Eating a healthy diet.  Avoiding tobacco and drug use.  Limiting alcohol use.  Practicing safe sex.  Taking low doses of aspirin every day.  Taking vitamin and mineral supplements as recommended by your health care provider. What happens during an annual well check? The services and screenings done by your health care provider during your annual well check will depend on your age, overall health, lifestyle risk factors, and family history of disease. Counseling  Your health care provider may ask you  questions about your:  Alcohol use.  Tobacco use.  Drug use.  Emotional well-being.  Home and relationship well-being.  Sexual activity.  Eating habits.  History of falls.  Memory and ability to understand (cognition).  Work and work Statistician. Screening  You may have the following tests or measurements:  Height, weight, and BMI.  Blood pressure.  Lipid and cholesterol levels. These may be checked every 5 years, or more frequently if you are over 26 years old.  Skin check.  Lung cancer screening. You may have this screening every year starting at age 75 if you have a 30-pack-year history of smoking and currently smoke or have quit within the past 15 years.  Fecal occult blood test (FOBT) of the stool. You may have this test every year starting at age 66.  Flexible sigmoidoscopy or colonoscopy. You may have a sigmoidoscopy every 5 years or a colonoscopy every 10 years starting at age 76.  Prostate cancer screening. Recommendations will vary depending on your family history and other risks.  Hepatitis C blood test.  Hepatitis B blood test.  Sexually transmitted disease (STD) testing.  Diabetes screening. This is done by checking your blood sugar (glucose) after you have not eaten for a while (fasting). You may have this done every 1-3 years.  Abdominal aortic aneurysm (AAA) screening. You may need this if you are a current or former smoker.  Osteoporosis. You may be screened starting at age 58 if you are at high risk. Talk with your health care provider about your test results, treatment options, and if necessary, the need for more tests. Vaccines  Your health care provider  may recommend certain vaccines, such as:  Influenza vaccine. This is recommended every year.  Tetanus, diphtheria, and acellular pertussis (Tdap, Td) vaccine. You may need a Td booster every 10 years.  Zoster vaccine. You may need this after age 79.  Pneumococcal 13-valent conjugate  (PCV13) vaccine. One dose is recommended after age 24.  Pneumococcal polysaccharide (PPSV23) vaccine. One dose is recommended after age 10. Talk to your health care provider about which screenings and vaccines you need and how often you need them. This information is not intended to replace advice given to you by your health care provider. Make sure you discuss any questions you have with your health care provider. Document Released: 01/11/2016 Document Revised: 09/03/2016 Document Reviewed: 10/16/2015 Elsevier Interactive Patient Education  2017 [employer] Prevention in the Home Falls can cause injuries. They can happen to people of all ages. There are many things you can do to make your home safe and to help prevent falls. What can I do on the outside of my home?  Regularly fix the edges of walkways and driveways and fix any cracks.  Remove anything that might make you trip as you walk through a door, such as a raised step or threshold.  Trim any bushes or trees on the path to your home.  Use bright outdoor lighting.  Clear any walking paths of anything that might make someone trip, such as rocks or tools.  Regularly check to see if handrails are loose or broken. Make sure that both sides of any steps have handrails.  Any raised decks and porches should have guardrails on the edges.  Have any leaves, snow, or ice cleared regularly.  Use sand or salt on walking paths during winter.  Clean up any spills in your garage right away. This includes oil or grease spills. What can I do in the bathroom?  Use night lights.  Install grab bars by the toilet and in the tub and shower. Do not use towel bars as grab bars.  Use non-skid mats or decals in the tub or shower.  If you need to sit down in the shower, use a plastic, non-slip stool.  Keep the floor dry. Clean up any water that spills on the floor as soon as it happens.  Remove soap buildup in the tub or shower  regularly.  Attach bath mats securely with double-sided non-slip rug tape.  Do not have throw rugs and other things on the floor that can make you trip. What can I do in the bedroom?  Use night lights.  Make sure that you have a light by your bed that is easy to reach.  Do not use any sheets or blankets that are too big for your bed. They should not hang down onto the floor.  Have a firm chair that has side arms. You can use this for support while you get dressed.  Do not have throw rugs and other things on the floor that can make you trip. What can I do in the kitchen?  Clean up any spills right away.  Avoid walking on wet floors.  Keep items that you use a lot in easy-to-reach places.  If you need to reach something above you, use a strong step stool that has a grab bar.  Keep electrical cords out of the way.  Do not use floor polish or wax that makes floors slippery. If you must use wax, use non-skid floor wax.  Do not have throw rugs  and other things on the floor that can make you trip. What can I do with my stairs?  Do not leave any items on the stairs.  Make sure that there are handrails on both sides of the stairs and use them. Fix handrails that are broken or loose. Make sure that handrails are as long as the stairways.  Check any carpeting to make sure that it is firmly attached to the stairs. Fix any carpet that is loose or worn.  Avoid having throw rugs at the top or bottom of the stairs. If you do have throw rugs, attach them to the floor with carpet tape.  Make sure that you have a light switch at the top of the stairs and the bottom of the stairs. If you do not have them, ask someone to add them for you. What else can I do to help prevent falls?  Wear shoes that:  Do not have high heels.  Have rubber bottoms.  Are comfortable and fit you well.  Are closed at the toe. Do not wear sandals.  If you use a stepladder:  Make sure that it is fully  opened. Do not climb a closed stepladder.  Make sure that both sides of the stepladder are locked into place.  Ask someone to hold it for you, if possible.  Clearly mark and make sure that you can see:  Any grab bars or handrails.  First and last steps.  Where the edge of each step is.  Use tools that help you move around (mobility aids) if they are needed. These include:  Canes.  Walkers.  Scooters.  Crutches.  Turn on the lights when you go into a dark area. Replace any light bulbs as soon as they burn out.  Set up your furniture so you have a clear path. Avoid moving your furniture around.  If any of your floors are uneven, fix them.  If there are any pets around you, be aware of where they are.  Review your medicines with your doctor. Some medicines can make you feel dizzy. This can increase your chance of falling. Ask your doctor what other things that you can do to help prevent falls. This information is not intended to replace advice given to you by your health care provider. Make sure you discuss any questions you have with your health care provider. Document Released: 10/11/2009 Document Revised: 05/22/2016 Document Reviewed: 01/19/2015 Elsevier Interactive Patient Education  2017 [employer].

## 2019-10-11 NOTE — Telephone Encounter (Signed)
Proceed with PT for assessment and treatment of poor balance with high fall risk. Ask patient when fibular fracture occurred. No ER or x-ray reports in chart.

## 2019-10-11 NOTE — Telephone Encounter (Signed)
Thanks

## 2019-10-11 NOTE — Telephone Encounter (Signed)
Completed AWV today. Pt stated that he has had multiple falls in the last year and fractured his fibula from one of the falls. Pt is considered a high fall risk. Preventatives discussed. Pt is interested in PT to help prevent future falls. Ok to order PT?

## 2019-10-12 ENCOUNTER — Encounter: Payer: Self-pay | Admitting: Physician Assistant

## 2019-10-12 ENCOUNTER — Ambulatory Visit: Payer: Medicare HMO | Admitting: Physician Assistant

## 2019-10-12 ENCOUNTER — Other Ambulatory Visit: Payer: Self-pay

## 2019-10-12 VITALS — BP 130/74 | HR 72 | Ht 71.0 in | Wt 219.8 lb

## 2019-10-12 DIAGNOSIS — I10 Essential (primary) hypertension: Secondary | ICD-10-CM | POA: Diagnosis not present

## 2019-10-12 DIAGNOSIS — E7800 Pure hypercholesterolemia, unspecified: Secondary | ICD-10-CM

## 2019-10-12 DIAGNOSIS — I4891 Unspecified atrial fibrillation: Secondary | ICD-10-CM | POA: Diagnosis not present

## 2019-10-12 MED ORDER — FLECAINIDE ACETATE 50 MG PO TABS: 50.0000 mg | ORAL_TABLET | Freq: Two times a day (BID) | ORAL | 3 refills | Status: DC

## 2019-10-12 NOTE — Addendum Note (Signed)
Addended by: Gar Ponto on: 10/12/2019 03:54 PM   Modules accepted: Orders

## 2019-10-12 NOTE — Patient Instructions (Signed)
Medication Instructions:  Your physician recommends that you continue on your current medications as directed. Please refer to the Current Medication list given to you today.  If you need a refill on your cardiac medications before your next appointment, please call your pharmacy.   Lab work: None Ordered  If you have labs (blood work) drawn today and your tests are completely normal, you will receive your results only by: Marland Kitchen MyChart Message (if you have MyChart) OR . A paper copy in the mail If you have any lab test that is abnormal or we need to change your treatment, we will call you to review the results.  Testing/Procedures: None ordered  Follow-Up: At [facility], you and your health needs are our priority.  As part of our continuing mission to provide you with exceptional heart care, we have created designated Provider Care Teams.  These Care Teams include your primary Cardiologist (physician) and Advanced Practice Providers (APPs -  Physician Assistants and Nurse Practitioners) who all work together to provide you with the care you need, when you need it. . You will need a follow up appointment in 3-4 months.  Please call our office 2 months in advance to schedule this appointment.  You may see Ena Dawley, MD or one of the following Advanced Practice Providers on your designated Care Team:   . Melina Copa, PA-C . Ermalinda Barrios, PA-C  Any Other Special Instructions Will Be Listed Below (If Applicable).

## 2019-10-27 DIAGNOSIS — M5136 Other intervertebral disc degeneration, lumbar region: Secondary | ICD-10-CM | POA: Diagnosis not present

## 2019-10-27 DIAGNOSIS — M545 Low back pain: Secondary | ICD-10-CM | POA: Diagnosis not present

## 2019-11-07 DIAGNOSIS — M545 Low back pain: Secondary | ICD-10-CM | POA: Diagnosis not present

## 2019-11-07 DIAGNOSIS — R2681 Unsteadiness on feet: Secondary | ICD-10-CM | POA: Diagnosis not present

## 2019-11-07 DIAGNOSIS — R2689 Other abnormalities of gait and mobility: Secondary | ICD-10-CM | POA: Diagnosis not present

## 2019-11-09 DIAGNOSIS — M545 Low back pain: Secondary | ICD-10-CM | POA: Diagnosis not present

## 2019-11-09 DIAGNOSIS — R2681 Unsteadiness on feet: Secondary | ICD-10-CM | POA: Diagnosis not present

## 2019-11-09 DIAGNOSIS — R2689 Other abnormalities of gait and mobility: Secondary | ICD-10-CM | POA: Diagnosis not present

## 2019-11-16 DIAGNOSIS — R2681 Unsteadiness on feet: Secondary | ICD-10-CM | POA: Diagnosis not present

## 2019-11-16 DIAGNOSIS — M545 Low back pain: Secondary | ICD-10-CM | POA: Diagnosis not present

## 2019-11-16 DIAGNOSIS — R2689 Other abnormalities of gait and mobility: Secondary | ICD-10-CM | POA: Diagnosis not present

## 2019-11-22 DIAGNOSIS — M545 Low back pain: Secondary | ICD-10-CM | POA: Diagnosis not present

## 2019-11-22 DIAGNOSIS — R2681 Unsteadiness on feet: Secondary | ICD-10-CM | POA: Diagnosis not present

## 2019-11-22 DIAGNOSIS — R2689 Other abnormalities of gait and mobility: Secondary | ICD-10-CM | POA: Diagnosis not present

## 2019-11-25 ENCOUNTER — Other Ambulatory Visit: Payer: Self-pay | Admitting: Cardiology

## 2019-11-25 ENCOUNTER — Other Ambulatory Visit: Payer: Self-pay | Admitting: Family Medicine

## 2019-11-29 DIAGNOSIS — R2681 Unsteadiness on feet: Secondary | ICD-10-CM | POA: Diagnosis not present

## 2019-11-29 DIAGNOSIS — M545 Low back pain: Secondary | ICD-10-CM | POA: Diagnosis not present

## 2019-11-29 DIAGNOSIS — R2689 Other abnormalities of gait and mobility: Secondary | ICD-10-CM | POA: Diagnosis not present

## 2019-12-07 DIAGNOSIS — R2681 Unsteadiness on feet: Secondary | ICD-10-CM | POA: Diagnosis not present

## 2019-12-07 DIAGNOSIS — R2689 Other abnormalities of gait and mobility: Secondary | ICD-10-CM | POA: Diagnosis not present

## 2019-12-07 DIAGNOSIS — M545 Low back pain: Secondary | ICD-10-CM | POA: Diagnosis not present

## 2019-12-12 DIAGNOSIS — R2681 Unsteadiness on feet: Secondary | ICD-10-CM | POA: Diagnosis not present

## 2019-12-12 DIAGNOSIS — M545 Low back pain: Secondary | ICD-10-CM | POA: Diagnosis not present

## 2019-12-12 DIAGNOSIS — R2689 Other abnormalities of gait and mobility: Secondary | ICD-10-CM | POA: Diagnosis not present

## 2019-12-14 ENCOUNTER — Other Ambulatory Visit: Payer: Self-pay | Admitting: Cardiology

## 2019-12-15 DIAGNOSIS — R2689 Other abnormalities of gait and mobility: Secondary | ICD-10-CM | POA: Diagnosis not present

## 2019-12-15 DIAGNOSIS — M545 Low back pain: Secondary | ICD-10-CM | POA: Diagnosis not present

## 2019-12-15 DIAGNOSIS — R2681 Unsteadiness on feet: Secondary | ICD-10-CM | POA: Diagnosis not present

## 2019-12-15 DIAGNOSIS — H2513 Age-related nuclear cataract, bilateral: Secondary | ICD-10-CM | POA: Diagnosis not present

## 2019-12-26 DIAGNOSIS — M545 Low back pain: Secondary | ICD-10-CM | POA: Diagnosis not present

## 2020-01-17 ENCOUNTER — Encounter: Payer: Self-pay | Admitting: Physician Assistant

## 2020-01-17 NOTE — Progress Notes (Signed)
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  He initially underwent surgery for left femoral nail removal in 2018. During surgery he went into atrial fib with RVR which was new for him. 2-D echo showed normal LV function EF 55-60% with mildly dilated left atrium and mild LVH. CHADSVASC was 2 for hypertension and age and he was started on Eliquis 5 mg BID. He eventually underwent TEE/DCCV 12/2017 with conversion to NSR. TEE had shown moderate MR/TR as well. Since that time he has required additional DCCV in 12/2018 for recurrence. He has required medication adjustment since then. He was ultimately placed on flecainide in 08/2019 and underwent repeat DCCV 08/2019. He does not have prior ischemic assessment, although plan for future POET was mentioned in 08/2019 notes. Last personally reviewed labs 08/2019 showed Hgb 14.1, MCV 100, K 3.7, Cr 1.08, glucose 108, TSH wnl, LFTs wnl, trig 152, LDL 92 (followed by primary care). Also reviewed result note, carotid duplex 12/2017 with 1-39% stenosis on the left, felt to be normal by Dr. [name].  He returns for follow-up overall doing well. He denies any interim cardiac issues. No chest pain, SOB, or breakthrough atrial fibrillation. He has not had any dizziness or syncope. His activity is limited at times by his arthritis although he's found CBD oil helps  His daughter is a Psychologist, sport and exercise and recommended this to him. He enjoys handcrafting. He denies any unusual  bleeding.   Past Medical History:  Diagnosis Date  . 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 Anemia    hx of one time  . Arthritis    OA  . Closed intertrochanteric fracture of hip, left, initial encounter ([facility]) 10/10/2016  . Colon, diverticulosis 05/31/2009  . Difficulty sleeping   . Diverticulitis large intestine 05/31/2009  . Diverticulosis 2014  . 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 First degree AV block   . GERD (gastroesophageal reflux disease)   . GI symptom    had nausea / vomited once / frequent stools / getting better  . Hyperlipidemia   . Hypertension   . Hypothyroidism   . Mitral regurgitation   . Mood changes   . Pneumonia    hx of  . Tricuspid regurgitation     Past Surgical History:  Procedure Laterality Date  . CARDIOVERSION N/A 01/12/2018   Procedure: CARDIOVERSION;  Surgeon: Dorothy Spark, MD;  Location: [facility] ENDOSCOPY;  Service: Cardiovascular;  Laterality: N/A;  . CARDIOVERSION N/A 01/26/2019   Procedure: CARDIOVERSION;  Surgeon: Jerline Pain, MD;  Location: [facility] ENDOSCOPY;  Service: Cardiovascular;  Laterality: N/A;  . CARDIOVERSION N/A 09/30/2019   Procedure: CARDIOVERSION;  Surgeon: Lelon Perla, MD;  Location: [facility] ENDOSCOPY;  Service: Cardiovascular;  Laterality: N/A;  . COLONOSCOPY  2014  . FEMUR IM NAIL Left 10/11/2016   Procedure: INTRAMEDULLARY (IM) NAIL FEMORAL;  Surgeon: [name], MD;  Location: [facility] ORS;  Service: Orthopedics;  Laterality: Left;  . HAND SURGERY  1973 / 2010 / 2015   right to release tendons  . HARDWARE REMOVAL Left 10/05/2017   Procedure: Removal of left femoral nail;  Surgeon: [name], MD;  Location: [facility] ORS;  Service: Orthopedics;  Laterality: Left;  90 mins  . HEMATOMA EVACUATION Left 01/19/2018   Procedure: Irrigation and debridement, Evacuation of left total knee hematoma;  Surgeon: [name], MD;  Location: [facility] ORS;  Service: Orthopedics;  Laterality: Left;  . KNEE ARTHROSCOPY  1984  & 2001   twice  . NASAL SEPTUM SURGERY    . removal Left femoral nail      10/05/17 Dr. [name]  . [place] & 2006   Right and left  . TEE WITH CARDIOVERSION  01/12/2018  . TEE WITHOUT CARDIOVERSION N/A 01/12/2018   Procedure: TRANSESOPHAGEAL ECHOCARDIOGRAM (TEE);  Surgeon: Dorothy Spark, MD;  Location: [facility] ENDOSCOPY;  Service: Cardiovascular;  Laterality: N/A;  . TONSILLECTOMY    . TOTAL KNEE ARTHROPLASTY Right 01/09/2015   Procedure: RIGHT TOTAL KNEE ARTHROPLASTY;  Surgeon: [name], MD;  Location: [facility] ORS;  Service: Orthopedics;  Laterality: Right;  . TOTAL KNEE ARTHROPLASTY Left 11/23/2017   Procedure: LEFT TOTAL KNEE ARTHROPLASTY;  Surgeon: [name], MD;  Location: [facility] ORS;  Service: Orthopedics;  Laterality: Left;  90 mins    Current Medications: Current Meds  Medication Sig  . acetaminophen (TYLENOL) 500 MG tablet Take 1,500 mg by mouth at bedtime as needed for mild pain or moderate pain.   Marland Kitchen amLODipine (NORVASC) 10 MG tablet Take 1 tablet (10 mg total) by mouth daily.  Marland Kitchen atorvastatin (LIPITOR) 20 MG tablet Take 1 tablet (20 mg total) by mouth every evening.  . Cholecalciferol (VITAMIN D) 2000 UNITS CAPS Take 2,000 Units by mouth every evening.   Marland Kitchen ELIQUIS 5 MG TABS tablet TAKE 1 TABLET TWICE DAILY  . flecainide (TAMBOCOR) 50 MG tablet Take 1 tablet (50 mg total) by mouth 2 (two) times daily.  Marland Kitchen ibuprofen (ADVIL) 200 MG tablet Take 800 mg by mouth daily as needed for moderate pain.  Marland Kitchen levothyroxine (SYNTHROID, LEVOTHROID) 150 MCG tablet TAKE 1 TABLET EVERY DAY  . Menthol, Topical Analgesic, (BLUE-EMU MAXIMUM STRENGTH EX) Apply 1 application topically at bedtime as needed (pain).  . metoprolol tartrate (LOPRESSOR) 50 MG tablet TAKE 1 TABLET TWICE DAILY  . Multiple Vitamin (MULTIVITAMIN WITH MINERALS) TABS tablet Take 1 tablet by mouth daily.   . Multiple Vitamins-Minerals (PRESERVISION AREDS 2) CAPS Take 1 capsule by mouth 2 (two) times  daily.   . pantoprazole (PROTONIX) 40 MG tablet TAKE 1 TABLET EVERY DAY  . polycarbophil (FIBERCON) 625 MG tablet Take 625 mg by mouth daily.   . valsartan-hydrochlorothiazide (DIOVAN-HCT) 320-25 MG tablet TAKE 1 TABLET EVERY DAY  . venlafaxine XR (EFFEXOR-XR) 75 MG 24 hr capsule TAKE 1 CAPSULE EVERY DAY      Allergies:   Sulfa antibiotics   Social History   Socioeconomic History  . Marital status: Married    Spouse name: Not on file  . Number of children: 2  . Years of education: Not on file  . Highest education level: Professional school degree (e.g., MD, DDS, DVM, JD)  Occupational History  . Occupation: retired, has degree in physiology  Tobacco Use  . Smoking status: Former Smoker    Quit date: 12/29/1968    Years since quitting: 51.0  . Smokeless tobacco: Never Used  Substance and Sexual Activity  . Alcohol use: Yes  Alcohol/week: 12.0 standard drinks    Types: 12 Cans of beer per week    Comment: weekly  . Drug use: No  . Sexual activity: Yes  Other Topics Concern  . Not on file  Social History Narrative   Pt lives in [place] w/ wife.   Social Determinants of Health   Financial Resource Strain:   . Difficulty of Paying Living Expenses: Not on file  Food Insecurity:   . Worried About Charity fundraiser in the Last Year: Not on file  . Ran Out of Food in the Last Year: Not on file  Transportation Needs:   . Lack of Transportation (Medical): Not on file  . Lack of Transportation (Non-Medical): Not on file  Physical Activity: Inactive  . Days of Exercise per Week: 0 days  . Minutes of Exercise per Session: 0 min  Stress:   . Feeling of Stress : Not on file  Social Connections:   . Frequency of Communication with Friends and Family: Not on file  . Frequency of Social Gatherings with Friends and Family: Not on file  . Attends Religious Services: Not on file  . Active Member of Clubs or Organizations: Not on file  . Attends Archivist Meetings:  Not on file  . Marital Status: Not on file     Family History:  The patient's family history includes Breast cancer in his mother. There is no history of Colon cancer, Esophageal cancer, Stomach cancer, or Rectal cancer.  ROS:   Please see the history of present illness.  All other systems are reviewed and otherwise negative.    EKGs/Labs/Other Studies Reviewed:    Studies reviewed are outlined and summarized above.  TEE 12/2017 Study Conclusions  - Left ventricle: Systolic function was normal. The estimated   ejection fraction was in the range of 55% to 60%. Wall motion was   normal; there were no regional wall motion abnormalities. - Aortic valve: There was mild regurgitation. - Mitral valve: There was moderate regurgitation. - Left atrium: The atrium was dilated. No evidence of thrombus in   the atrial cavity or appendage. No evidence of thrombus in the   atrial cavity or appendage. No evidence of thrombus in the atrial   cavity or appendage. - Right atrium: The atrium was dilated. No evidence of thrombus in   the atrial cavity or appendage. - Atrial septum: No defect or patent foramen ovale was identified. - Tricuspid valve: There was moderate regurgitation.  Impressions:  - No cardiac source of emboli was indentified. The study was   followed by a successful cardioversion.    EKG:  EKG is ordered today, personally reviewed, demonstrating NSR  76bpm, 1st degree AV block, nonspecific TW changes, left axis deviation, QRS 64ms - similar to prior  Recent Labs: 08/04/2019: ALT 35; TSH 1.470 09/26/2019: BUN 13; Creatinine, Ser 1.04; Hemoglobin 14.1; Platelets 243; Potassium 3.7; Sodium 139  Recent Lipid Panel    Component Value Date/Time   CHOL 185 08/04/2019 1359   TRIG 152 (H) 08/04/2019 1359   HDL 63 08/04/2019 1359   CHOLHDL 2.9 08/04/2019 1359   CHOLHDL 3.8 10/11/2016 0337   VLDL UNABLE TO CALCULATE IF TRIGLYCERIDE OVER 400 mg/dL 10/11/2016 0337   LDLCALC 92  08/04/2019 1359    PHYSICAL EXAM:    VS:  BP 140/72   Pulse 76   Ht 5\' 11"  (1.803 m)   Wt 226 lb (102.5 kg)   BMI 31.52 kg/m   BMI:  Body mass index is 31.52 kg/m.  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 Paroxysmal atrial fibrillation on flecainide - EKG appears stable today. He's done clinically well since addition of flecainide. He will continue this and metoprolol. His potassium was slightly below goal on last check, will obtain along with magnesium as well today. We discussed relationship that ETOH can contribute to increased atrial fib burden (intake last reported 12 beers per week). Reduction in use was recommended. He also hopes to try and increase activity more. We did discuss that he has not had previous ischemic testing and has not have an ETT post-flecainide-initiation. I discussed the rationale for this including the small potential for flecainideto pose pro-arrhythmia risk of dangerous heart rhythms. He does not wish to pursue any cardiac testing at present time I will reach out to Dr. [name] to get her thoughts. He is asymptomatic from anginal standpoint. CHADSVASC 2 so will continue Eliquis. He limits NSAIDS. Bleeding precautions reviewed. 2. Moderate mitral regurgitation/tricuspid regurgitation - noted on echo  in 2019. No murmur on exam. This was observed while patient was out of rhythm. As above, patient does not wish to proceed with any cardiac testing since asymptomatic.Continue to monitor clinically. 3. Essential HTN - BP mildly increased but he reports normal values at home (<130/70). As above, discussed reduction in ETOH and increase in physical activity. He will notify if he begins to see elevations beyond normal range.  Disposition: F/u with Dr. [name] in 6 months.  Medication Adjustments/Labs and Tests Ordered: Current medicines are reviewed at length with the patient today.  Concerns regarding medicines are outlined above. Medication changes, Labs and Tests ordered today are summarized above and listed in the Patient Instructions accessible in Encounters.   Signed, Charlie Pitter, PA-C  01/19/2020 4:23 PM    [facility] [place], [place], [place]  [address] Phone: [phone]; Fax: [phone]

## 2020-01-19 ENCOUNTER — Encounter: Payer: Self-pay | Admitting: Physician Assistant

## 2020-01-19 ENCOUNTER — Other Ambulatory Visit: Payer: Self-pay

## 2020-01-19 ENCOUNTER — Telehealth: Payer: Self-pay | Admitting: Physician Assistant

## 2020-01-19 ENCOUNTER — Ambulatory Visit: Payer: Medicare HMO | Admitting: Physician Assistant

## 2020-01-19 VITALS — BP 140/72 | HR 76 | Ht 71.0 in | Wt 226.0 lb

## 2020-01-19 DIAGNOSIS — Z5181 Encounter for therapeutic drug level monitoring: Secondary | ICD-10-CM

## 2020-01-19 DIAGNOSIS — Z79899 Other long term (current) drug therapy: Secondary | ICD-10-CM | POA: Diagnosis not present

## 2020-01-19 DIAGNOSIS — I10 Essential (primary) hypertension: Secondary | ICD-10-CM | POA: Diagnosis not present

## 2020-01-19 DIAGNOSIS — I480 Paroxysmal atrial fibrillation: Secondary | ICD-10-CM

## 2020-01-19 DIAGNOSIS — I071 Rheumatic tricuspid insufficiency: Secondary | ICD-10-CM | POA: Diagnosis not present

## 2020-01-19 DIAGNOSIS — I340 Nonrheumatic mitral (valve) insufficiency: Secondary | ICD-10-CM | POA: Diagnosis not present

## 2020-01-19 NOTE — Patient Instructions (Addendum)
Medication Instructions:  Your physician recommends that you continue on your current medications as directed. Please refer to the Current Medication list given to you today.  *If you need a refill on your cardiac medications before your next appointment, please call your pharmacy*  Lab Work: TODAY:  BMET & MAG   If you have labs (blood work) drawn today and your tests are completely normal, you will receive your results only by: Marland Kitchen MyChart Message (if you have MyChart) OR . A paper copy in the mail If you have any lab test that is abnormal or we need to change your treatment, we will call you to review the results.  Testing/Procedures: None ordered  Follow-Up: At [facility], you and your health needs are our priority.  As part of our continuing mission to provide you with exceptional heart care, we have created designated Provider Care Teams.  These Care Teams include your primary Cardiologist (physician) and Advanced Practice Providers (APPs -  Physician Assistants and Nurse Practitioners) who all work together to provide you with the care you need, when you need it.  Your next appointment:   6 month(s)  The format for your next appointment:   In Person  Provider:   You may see Ena Dawley, MD or one of the following Advanced Practice Providers on your designated Care Team:    Melina Copa, PA-C  Ermalinda Barrios, PA-C

## 2020-01-19 NOTE — Progress Notes (Signed)
I would prefer if he had ETT, if he refuses, I would still continue flecainide if his baseline ECG is normal - normal QRS width

## 2020-01-19 NOTE — Telephone Encounter (Signed)
   JW, Will send you this note for when you call with patient's lab results when they come back. Please let Mr. Huizar know I discussed the issue of stress testing with Dr. [name] since patients on flecianide typically require a treadmill test to exclude silent EKG changes with activity. She states she would prefer that he had exercise treadmill test but if he declines she would continue the flecainide as long as EKG continues with normal QRS width. This interval appeared similar to prior values (84-81ms in the past pre-flecainide, 62ms today). Therefore we will continue to monitor EKG at each visit.  If he chooses not to do ETT, it will be important like we discussed to let us know if he has any new heart symptoms develop. Keep in mind the way we do the ETT is not so much to exert to a certain peak of exercise, but to replicate the level of activity he is able to do maximally in every day life - just with the ability to do EKG at the same time. Thanks. Kayci Belleville PA-C

## 2020-01-20 LAB — BASIC METABOLIC PANEL
BUN/Creatinine Ratio: 17 (ref 10–24)
BUN: 17 mg/dL (ref 8–27)
CO2: 27 mmol/L (ref 20–29)
Calcium: 10 mg/dL (ref 8.6–10.2)
Chloride: 96 mmol/L (ref 96–106)
Creatinine, Ser: 0.98 mg/dL (ref 0.76–1.27)
GFR calc Af Amer: 87 mL/min/{1.73_m2} (ref >59)
GFR calc non Af Amer: 76 mL/min/{1.73_m2} (ref >59)
Glucose: 109 mg/dL — ABNORMAL HIGH (ref 65–99)
Potassium: 3.4 mmol/L — ABNORMAL LOW (ref 3.5–5.2)
Sodium: 136 mmol/L (ref 134–144)

## 2020-01-20 LAB — MAGNESIUM: Magnesium: 1.8 mg/dL (ref 1.6–2.3)

## 2020-01-20 MED ORDER — MAGNESIUM OXIDE 400 (241.3 MG) MG PO TABS: 400.0000 mg | ORAL_TABLET | Freq: Every day | ORAL | 11 refills | Status: DC

## 2020-01-20 MED ORDER — POTASSIUM CHLORIDE CRYS ER 20 MEQ PO TBCR: 40.0000 meq | EXTENDED_RELEASE_TABLET | Freq: Every day | ORAL | 11 refills | Status: DC

## 2020-01-20 NOTE — Telephone Encounter (Signed)
-----   Message from Charlie Pitter, [place] sent at 01/20/2020  7:47 AM EST ----- Please call pt. Potassium and magnesium level are a bit low, especially for being on flecainide. Add KCl 96meq daily and Magox 400mg  daily. Please increase dietary intake of healthy sources of potassium/magnesium including leafy greens, squash, nuts, seeds, fish, beans, whole grains, avocados, yogurt, and bananas. Recheck BMET/Mg level in 1 week. Reduce alcohol as well, since this can bring down electrolytes. Please also see phone note regarding Dr. Francesca Oman commentary on ETT. Dayna Dunn PA-C

## 2020-01-20 NOTE — Telephone Encounter (Signed)
Discussed with pt's wife, DPR on file re: phone message below re: ETT and discussed Dr. Francesca Oman recommendations.  She will discuss with pt and if he decided to go along with it, they will call back and we will arrange.

## 2020-01-20 NOTE — Addendum Note (Signed)
Addended by: Gaetano Net on: 01/20/2020 08:32 AM   Modules accepted: Orders

## 2020-01-27 ENCOUNTER — Other Ambulatory Visit: Payer: Medicare HMO | Admitting: *Deleted

## 2020-01-27 ENCOUNTER — Other Ambulatory Visit: Payer: Self-pay

## 2020-01-27 ENCOUNTER — Telehealth: Payer: Self-pay | Admitting: *Deleted

## 2020-01-27 DIAGNOSIS — I10 Essential (primary) hypertension: Secondary | ICD-10-CM

## 2020-01-27 DIAGNOSIS — Z5181 Encounter for therapeutic drug level monitoring: Secondary | ICD-10-CM | POA: Diagnosis not present

## 2020-01-27 DIAGNOSIS — Z79899 Other long term (current) drug therapy: Secondary | ICD-10-CM

## 2020-01-27 LAB — BASIC METABOLIC PANEL
BUN/Creatinine Ratio: 18 (ref 10–24)
BUN: 15 mg/dL (ref 8–27)
CO2: 26 mmol/L (ref 20–29)
Calcium: 9.6 mg/dL (ref 8.6–10.2)
Chloride: 98 mmol/L (ref 96–106)
Creatinine, Ser: 0.83 mg/dL (ref 0.76–1.27)
GFR calc Af Amer: 100 mL/min/{1.73_m2} (ref >59)
GFR calc non Af Amer: 87 mL/min/{1.73_m2} (ref >59)
Glucose: 116 mg/dL — ABNORMAL HIGH (ref 65–99)
Potassium: 4.1 mmol/L (ref 3.5–5.2)
Sodium: 138 mmol/L (ref 134–144)

## 2020-01-27 LAB — MAGNESIUM: Magnesium: 1.9 mg/dL (ref 1.6–2.3)

## 2020-01-27 MED ORDER — MAGNESIUM OXIDE -MG SUPPLEMENT 400 (240 MG) MG PO TABS: 400.0000 mg | ORAL_TABLET | Freq: Two times a day (BID) | ORAL | 11 refills | Status: DC

## 2020-01-27 NOTE — Telephone Encounter (Signed)
-----   Message from Charlie Pitter, [place] sent at 01/27/2020  4:53 PM EST ----- Please let patient know potassium is normal so continue current dose. Mg has improved slightly but still below goal of 2.0. Increase MagOx to 400mg  BID and recheck Mg level 1-2 weeks. Again, encourage magnesium-rich foods.  Dayna Dunn PA-C

## 2020-01-29 ENCOUNTER — Ambulatory Visit: Payer: Self-pay

## 2020-02-04 ENCOUNTER — Ambulatory Visit: Payer: Medicare HMO | Attending: Internal Medicine

## 2020-02-04 DIAGNOSIS — Z23 Encounter for immunization: Secondary | ICD-10-CM | POA: Insufficient documentation

## 2020-02-04 NOTE — Progress Notes (Signed)
   Covid-19 Vaccination Clinic  Name:  Eugene White    MRN: [id number] DOB: [birth year]  02/04/2020  Mr. Covin was observed post Covid-19 immunization for 15 minutes without incidence. He was provided with Vaccine Information Sheet and instruction to access the V-Safe system.   Mr. Glassburn was instructed to call 911 with any severe reactions post vaccine: Marland Kitchen Difficulty breathing  . Swelling of your face and throat  . A fast heartbeat  . A bad rash all over your body  . Dizziness and weakness    Immunizations Administered    Name Date Dose VIS Date Route   Pfizer COVID-19 Vaccine 02/04/2020  9:52 AM 0.3 mL 12/09/2019 Intramuscular   Manufacturer: [place]   Lot: [id number]   [place]: [id number]

## 2020-02-09 ENCOUNTER — Ambulatory Visit: Payer: Self-pay

## 2020-02-10 ENCOUNTER — Other Ambulatory Visit: Payer: Medicare HMO | Admitting: *Deleted

## 2020-02-10 ENCOUNTER — Other Ambulatory Visit: Payer: Self-pay

## 2020-02-10 DIAGNOSIS — Z79899 Other long term (current) drug therapy: Secondary | ICD-10-CM | POA: Diagnosis not present

## 2020-02-11 LAB — BASIC METABOLIC PANEL
BUN/Creatinine Ratio: 16 (ref 10–24)
BUN: 14 mg/dL (ref 8–27)
CO2: 28 mmol/L (ref 20–29)
Calcium: 9.7 mg/dL (ref 8.6–10.2)
Chloride: 96 mmol/L (ref 96–106)
Creatinine, Ser: 0.86 mg/dL (ref 0.76–1.27)
GFR calc Af Amer: 99 mL/min/{1.73_m2} (ref >59)
GFR calc non Af Amer: 85 mL/min/{1.73_m2} (ref >59)
Glucose: 92 mg/dL (ref 65–99)
Potassium: 3.8 mmol/L (ref 3.5–5.2)
Sodium: 138 mmol/L (ref 134–144)

## 2020-02-11 LAB — MAGNESIUM: Magnesium: 1.9 mg/dL (ref 1.6–2.3)

## 2020-02-15 ENCOUNTER — Other Ambulatory Visit: Payer: Self-pay | Admitting: Family Medicine

## 2020-02-28 ENCOUNTER — Ambulatory Visit: Payer: Medicare HMO | Attending: Internal Medicine

## 2020-02-28 DIAGNOSIS — Z23 Encounter for immunization: Secondary | ICD-10-CM | POA: Insufficient documentation

## 2020-02-28 NOTE — Progress Notes (Signed)
   Covid-19 Vaccination Clinic  Name:  Eugene White    MRN: [id number] DOB: 1946-02-05  02/28/2020  [name] was observed post Covid-19 immunization for 15 minutes without incident. He was provided with Vaccine Information Sheet and instruction to access the V-Safe system.   [name] was instructed to call 911 with any severe reactions post vaccine: Marland Kitchen Difficulty breathing  . Swelling of face and throat  . A fast heartbeat  . A bad rash all over body  . Dizziness and weakness   Immunizations Administered    Name Date Dose VIS Date Route   Pfizer COVID-19 Vaccine 02/28/2020 11:36 AM 0.3 mL 12/09/2019 Intramuscular   Manufacturer: [place]   Lot: [id number]   [place]: [id number]

## 2020-03-08 DIAGNOSIS — Z0100 Encounter for examination of eyes and vision without abnormal findings: Secondary | ICD-10-CM | POA: Diagnosis not present

## 2020-03-19 DIAGNOSIS — H52223 Regular astigmatism, bilateral: Secondary | ICD-10-CM | POA: Diagnosis not present

## 2020-03-19 DIAGNOSIS — H524 Presbyopia: Secondary | ICD-10-CM | POA: Diagnosis not present

## 2020-06-14 ENCOUNTER — Other Ambulatory Visit: Payer: Self-pay | Admitting: Physician Assistant

## 2020-07-11 ENCOUNTER — Other Ambulatory Visit: Payer: Self-pay | Admitting: Cardiology

## 2020-07-12 NOTE — Telephone Encounter (Signed)
Pt last saw Melina Copa, PA on 01/19/20, last labs 02/10/20 Creat 0.86, age 74, weight 102.5kg, based on specified criteria pt is on appropriate dosage of Eliquis 5mg  BID.  Will refill rx.

## 2020-08-31 ENCOUNTER — Other Ambulatory Visit: Payer: Self-pay | Admitting: Family Medicine

## 2020-08-31 NOTE — Telephone Encounter (Signed)
Eugene White patient L.O.V. was on 08/02/2019 and last filled on 11/28/2019.

## 2020-08-31 NOTE — Telephone Encounter (Signed)
Requested medication (s) are due for refill today: yes  Requested medication (s) are on the active medication list: yes  Last refill: 07/07/2020  Future visit scheduled: no  Notes to clinic:  overdue for follow up and labs    Requested Prescriptions  Pending Prescriptions Disp Refills   venlafaxine XR (EFFEXOR-XR) 75 MG 24 hr capsule [Pharmacy Med Name: VENLAFAXINE HYDROCHLORIDEER 75 MG Capsule Extended Release 24 Hour] 90 capsule 3    Sig: TAKE [place]      Psychiatry: Antidepressants - SNRI - desvenlafaxine & venlafaxine Failed - 08/31/2020  1:50 PM      Failed - LDL in normal range and within 360 days    LDL Calculated  Date Value Ref Range Status  08/04/2019 92 0 - 99 mg/dL Final          Failed - Total Cholesterol in normal range and within 360 days    Cholesterol, Total  Date Value Ref Range Status  08/04/2019 185 100 - 199 mg/dL Final          Failed - Triglycerides in normal range and within 360 days    Triglycerides  Date Value Ref Range Status  08/04/2019 152 (H) 0 - 149 mg/dL Final          Failed - Last BP in normal range    BP Readings from Last 1 Encounters:  01/19/20 140/72          Failed - Valid encounter within last 6 months    Recent Outpatient Visits           1 year ago Chronic rhinitis   [place], [place]uff, [place]   1 year ago Atrial fibrillation status post cardioversion [facility])   [facility], Vickki Muff, [place]   1 year ago Essential hypertension   [facility], Vickki Muff, [place]   1 year ago Benign essential HTN   [facility], [place], [place]   2 years ago Viral upper respiratory tract infection   [place], [place], [place]       Future Appointments             In 4 months [name], [place], MD [place], [facility]            Passed - Completed PHQ-2 or PHQ-9 in the last 360 days.

## 2020-09-04 NOTE — Telephone Encounter (Signed)
Needs OV scheduled with PCP before more refills. 30 day courtesy refill given.

## 2020-09-12 ENCOUNTER — Other Ambulatory Visit: Payer: Self-pay | Admitting: Family Medicine

## 2020-09-12 NOTE — Telephone Encounter (Signed)
[place] [facility] faxed refill request for the following medications:  valsartan-hydrochlorothiazide (DIOVAN-HCT) 320-25 MG tablet  Please advise. Thanks, [employer]

## 2020-09-27 ENCOUNTER — Other Ambulatory Visit: Payer: Self-pay | Admitting: Cardiology

## 2020-10-03 ENCOUNTER — Other Ambulatory Visit: Payer: Self-pay | Admitting: Physician Assistant

## 2020-11-05 ENCOUNTER — Other Ambulatory Visit: Payer: Self-pay | Admitting: Physician Assistant

## 2020-11-07 ENCOUNTER — Other Ambulatory Visit: Payer: Self-pay | Admitting: Family Medicine

## 2020-11-07 NOTE — Telephone Encounter (Signed)
Future appt scheduled for 11/13/20

## 2020-11-13 ENCOUNTER — Ambulatory Visit (INDEPENDENT_AMBULATORY_CARE_PROVIDER_SITE_OTHER): Payer: Medicare HMO | Admitting: Family Medicine

## 2020-11-13 ENCOUNTER — Encounter: Payer: Self-pay | Admitting: Family Medicine

## 2020-11-13 ENCOUNTER — Other Ambulatory Visit: Payer: Self-pay

## 2020-11-13 VITALS — BP 116/90 | HR 71 | Temp 97.9°F | Wt 229.0 lb

## 2020-11-13 DIAGNOSIS — I10 Essential (primary) hypertension: Secondary | ICD-10-CM | POA: Diagnosis not present

## 2020-11-13 DIAGNOSIS — E782 Mixed hyperlipidemia: Secondary | ICD-10-CM | POA: Diagnosis not present

## 2020-11-13 DIAGNOSIS — I4891 Unspecified atrial fibrillation: Secondary | ICD-10-CM | POA: Diagnosis not present

## 2020-11-13 NOTE — Progress Notes (Signed)
Established patient visit   Patient: Eugene White   DOB: 21-Jun-1946   74 y.o. Male  MRN: [id number] Visit Date: 11/13/2020  Today's healthcare provider: Vernie Murders, PA   No chief complaint on file.  Subjective    HPI  Hypertension, follow-up  BP Readings from Last 3 Encounters:  11/13/20 116/90  01/19/20 140/72  10/12/19 130/74   Wt Readings from Last 3 Encounters:  11/13/20 229 lb (103.9 kg)  01/19/20 226 lb (102.5 kg)  10/12/19 219 lb 12.8 oz (99.7 kg)     He was last seen for hypertension 15 months ago.  BP at that visit was 130/82. Management since that visit includes none.  He reports good compliance with treatment. He is not having side effects.   Use of agents associated with hypertension: none.   Outside blood pressures are not being checked  Symptoms: No chest pain No chest pressure  No palpitations No syncope  No dyspnea No orthopnea  No paroxysmal nocturnal dyspnea No lower extremity edema   Pertinent labs: Lab Results  Component Value Date   CHOL 185 08/04/2019   HDL 63 08/04/2019   LDLCALC 92 08/04/2019   TRIG 152 (H) 08/04/2019   CHOLHDL 2.9 08/04/2019   Lab Results  Component Value Date   NA 138 02/10/2020   K 3.8 02/10/2020   CREATININE 0.86 02/10/2020   GFRNONAA 85 02/10/2020   GFRAA 99 02/10/2020   GLUCOSE 92 02/10/2020     The 10-year ASCVD risk score Mikey Bussing DC Jr., et al., 2013) is: 22.2%   ---------------------------------------------------------------------------------------------------  Past Medical History:  Diagnosis Date  . 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 Anemia    hx of one time  . Arthritis    OA  . Closed intertrochanteric fracture of hip, left, initial encounter ([place]) 10/10/2016  . Colon, diverticulosis 05/31/2009  . Difficulty sleeping   . Diverticulitis large intestine 05/31/2009  . Diverticulosis 2014  . 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 First degree AV block   .  GERD (gastroesophageal reflux disease)   . GI symptom    had nausea / vomited once / frequent stools / getting better  . Hyperlipidemia   . Hypertension   . Hypothyroidism   . Mitral regurgitation   . Mood changes   . Pneumonia    hx of  . Tricuspid regurgitation    Past Surgical History:  Procedure Laterality Date  . CARDIOVERSION N/A 01/12/2018   Procedure: CARDIOVERSION;  Surgeon: Dorothy Spark, MD;  Location: [facility] ENDOSCOPY;  Service: Cardiovascular;  Laterality: N/A;  . CARDIOVERSION N/A 01/26/2019   Procedure: CARDIOVERSION;  Surgeon: Jerline Pain, MD;  Location: [facility] ENDOSCOPY;  Service: Cardiovascular;  Laterality: N/A;  . CARDIOVERSION N/A 09/30/2019   Procedure: CARDIOVERSION;  Surgeon: Lelon Perla, MD;  Location: [facility] ENDOSCOPY;  Service: Cardiovascular;  Laterality: N/A;  . COLONOSCOPY  2014  . FEMUR IM NAIL Left 10/11/2016   Procedure: INTRAMEDULLARY (IM) NAIL FEMORAL;  Surgeon: [name], MD;  Location: [facility] ORS;  Service: Orthopedics;  Laterality: Left;  . HAND SURGERY   1973 / 2010 / 2015   right to release tendons  . HARDWARE REMOVAL Left 10/05/2017   Procedure: Removal of left femoral nail;  Surgeon: [name], MD;  Location: [facility] ORS;  Service: Orthopedics;  Laterality: Left;  90 mins  . HEMATOMA EVACUATION Left 01/19/2018   Procedure: Irrigation and debridement, Evacuation of left total  knee hematoma;  Surgeon: [name], MD;  Location: [facility] ORS;  Service: Orthopedics;  Laterality: Left;  . KNEE ARTHROSCOPY  1984 & 2001   twice  . NASAL SEPTUM SURGERY    . removal Left femoral nail      10/05/17 Dr. [name]  . [place] & 2006   Right and left  . TEE WITH CARDIOVERSION  01/12/2018  . TEE WITHOUT CARDIOVERSION N/A 01/12/2018   Procedure: TRANSESOPHAGEAL ECHOCARDIOGRAM (TEE);  Surgeon: Dorothy Spark, MD;  Location: [facility] ENDOSCOPY;  Service: Cardiovascular;  Laterality: N/A;  . TONSILLECTOMY    . TOTAL KNEE ARTHROPLASTY  Right 01/09/2015   Procedure: RIGHT TOTAL KNEE ARTHROPLASTY;  Surgeon: [name], MD;  Location: [facility] ORS;  Service: Orthopedics;  Laterality: Right;  . TOTAL KNEE ARTHROPLASTY Left 11/23/2017   Procedure: LEFT TOTAL KNEE ARTHROPLASTY;  Surgeon: [name], MD;  Location: [facility] ORS;  Service: Orthopedics;  Laterality: Left;  90 mins   Family History  Problem Relation Age of Onset  . Breast cancer Mother   . Colon cancer Neg Hx   . Esophageal cancer Neg Hx   . Stomach cancer Neg Hx   . Rectal cancer Neg Hx    Allergies  Allergen Reactions  . Sulfa Antibiotics Other (See Comments)    Unknown/childhood allergy      Medications: Outpatient Medications Prior to Visit  Medication Sig  . acetaminophen (TYLENOL) 500 MG tablet Take 1,500 mg by mouth at bedtime as needed for mild pain or moderate pain.   Marland Kitchen amLODipine (NORVASC) 10 MG tablet TAKE 1 TABLET EVERY DAY  . atorvastatin (LIPITOR) 20 MG tablet TAKE 1 TABLET EVERY EVENING  . Cholecalciferol (VITAMIN D) 2000 UNITS CAPS Take 2,000 Units by mouth every evening.   Marland Kitchen ELIQUIS 5 MG TABS tablet TAKE 1 TABLET TWICE DAILY  . flecainide (TAMBOCOR) 50 MG tablet Take 1 tablet (50 mg total) by mouth 2 (two) times daily. Please keep upcoming appt in January 2022 with Dr. [name] for future refills. Thank you  . ibuprofen (ADVIL) 200 MG tablet Take 800 mg by mouth daily as needed for moderate pain.  Marland Kitchen levothyroxine (SYNTHROID) 150 MCG tablet TAKE 1 TABLET EVERY DAY  . Magnesium Oxide 400 (240 Mg) MG TABS Take 1 tablet (400 mg total) by mouth 2 (two) times daily.  . Menthol, Topical Analgesic, (BLUE-EMU MAXIMUM STRENGTH EX) Apply 1 application topically at bedtime as needed (pain).  . metoprolol tartrate (LOPRESSOR) 50 MG tablet TAKE 1 TABLET TWICE DAILY  . Multiple Vitamin (MULTIVITAMIN WITH MINERALS) TABS tablet Take 1 tablet by mouth daily.   . Multiple Vitamins-Minerals (PRESERVISION AREDS 2) CAPS Take 1 capsule by mouth 2 (two) times daily.   .  pantoprazole (PROTONIX) 40 MG tablet TAKE 1 TABLET EVERY DAY  . polycarbophil (FIBERCON) 625 MG tablet Take 625 mg by mouth daily.   . potassium chloride SA (KLOR-CON) 20 MEQ tablet Take 2 tablets (40 mEq total) by mouth daily.  . valsartan-hydrochlorothiazide (DIOVAN-HCT) 320-25 MG tablet TAKE 1 TABLET EVERY DAY  . venlafaxine XR (EFFEXOR-XR) 75 MG 24 hr capsule TAKE 1 CAPSULE EVERY DAY   No facility-administered medications prior to visit.    Review of Systems  Constitutional: Negative.   HENT: Negative.   Respiratory: Negative.   Cardiovascular: Negative.   Gastrointestinal: Negative.   Genitourinary: Negative.   Musculoskeletal: Positive for arthralgias, back pain and gait problem.       Objective  BP 116/90 (BP Location: Right Arm, Patient Position: Sitting, Cuff Size: Normal)   Pulse 71   Temp 97.9 F (36.6 C) (Oral)   Wt 229 lb (103.9 kg)   SpO2 98%   BMI 31.94 kg/m    Physical Exam Constitutional:      General: He is not in acute distress.    Appearance: He is well-developed.  HENT:     Head: Normocephalic and atraumatic.     Right Ear: Hearing normal.     Left Ear: Hearing normal.     Nose: Nose normal.  Eyes:     General: Lids are normal. No scleral icterus.       Right eye: No discharge.        Left eye: No discharge.     Conjunctiva/sclera: Conjunctivae normal.  Cardiovascular:     Rate and Rhythm: Normal rate and regular rhythm.     Pulses: Normal pulses.     Heart sounds: Normal heart sounds.  Pulmonary:     Effort: Pulmonary effort is normal. No respiratory distress.     Breath sounds: Normal breath sounds.  Abdominal:     General: Bowel sounds are normal.     Palpations: Abdomen is soft.  Musculoskeletal:        General: Normal range of motion.     Cervical back: Neck supple.     Comments: Uneven leg length since surgeries for knee replacements and nailing of the left hip fracture. History of surgery both shoulders for rotator cuff repairs.  Fair ROM throughout. Scars in palms from Dupuytren's contractures.  Skin:    Findings: No lesion or rash.  Neurological:     Mental Status: He is alert and oriented to person, place, and time.  Psychiatric:        Speech: Speech normal.        Behavior: Behavior normal.        Thought Content: Thought content normal.      No results found for any visits on 11/13/20.  Assessment & Plan     1. Primary hypertension Fair BP control with Diovan-HCT 320-25 mg qd, Amlodipine 10 mg qd and Metoprolol Tartrate 50 mg BID. Recheck labs. No dyspnea, palpitations or edema. - CBC with Differential/Platelet - Comprehensive metabolic panel - Lipid Panel With LDL/HDL Ratio - TSH  2. Mixed hyperlipidemia Tolerating Atorvastatin 20 mg q evening without side effects. Need to lose some weight and follow low fat diet. Recheck labs. - Comprehensive metabolic panel - Lipid Panel With LDL/HDL Ratio - TSH  3. Atrial fibrillation status post cardioversion [facility]) Three cardioversions (one in 2019 and 2 in 2020). Presently on Flecainide 50 mg BID, Eliquis 5 mg BID  and Metoprolol Tartrated 50 mg BID. Rhythm stable and regular today. Followed by [facility]. Recheck labs. - CBC with Differential/Platelet - Comprehensive metabolic panel - TSH   No follow-ups on file.      Andres Shad, PA, have reviewed all documentation for this visit. The documentation on 11/13/20 for the exam, diagnosis, procedures, and orders are all accurate and complete.    Vernie Murders, [place] [phone] (phone) [phone] (fax)  [place]

## 2020-11-16 DIAGNOSIS — E782 Mixed hyperlipidemia: Secondary | ICD-10-CM | POA: Diagnosis not present

## 2020-11-16 DIAGNOSIS — I4891 Unspecified atrial fibrillation: Secondary | ICD-10-CM | POA: Diagnosis not present

## 2020-11-16 DIAGNOSIS — I10 Essential (primary) hypertension: Secondary | ICD-10-CM | POA: Diagnosis not present

## 2020-11-17 LAB — CBC WITH DIFFERENTIAL/PLATELET
Basophils Absolute: 0.1 10*3/uL (ref 0.0–0.2)
Basos: 1 %
EOS (ABSOLUTE): 0.1 10*3/uL (ref 0.0–0.4)
Eos: 2 %
Hematocrit: 46.4 % (ref 37.5–51.0)
Hemoglobin: 15.6 g/dL (ref 13.0–17.7)
Immature Grans (Abs): 0 10*3/uL (ref 0.0–0.1)
Immature Granulocytes: 0 %
Lymphocytes Absolute: 2.3 10*3/uL (ref 0.7–3.1)
Lymphs: 34 %
MCH: 33.6 pg — ABNORMAL HIGH (ref 26.6–33.0)
MCHC: 33.6 g/dL (ref 31.5–35.7)
MCV: 100 fL — ABNORMAL HIGH (ref 79–97)
Monocytes Absolute: 0.8 10*3/uL (ref 0.1–0.9)
Monocytes: 11 %
Neutrophils Absolute: 3.6 10*3/uL (ref 1.4–7.0)
Neutrophils: 52 %
Platelets: 270 10*3/uL (ref 150–450)
RBC: 4.64 x10E6/uL (ref 4.14–5.80)
RDW: 12.1 % (ref 11.6–15.4)
WBC: 6.9 10*3/uL (ref 3.4–10.8)

## 2020-11-17 LAB — COMPREHENSIVE METABOLIC PANEL
ALT: 62 IU/L — ABNORMAL HIGH (ref 0–44)
AST: 48 IU/L — ABNORMAL HIGH (ref 0–40)
Albumin/Globulin Ratio: 2.2 (ref 1.2–2.2)
Albumin: 5 g/dL — ABNORMAL HIGH (ref 3.7–4.7)
Alkaline Phosphatase: 121 IU/L (ref 44–121)
BUN/Creatinine Ratio: 12 (ref 10–24)
BUN: 10 mg/dL (ref 8–27)
Bilirubin Total: 0.7 mg/dL (ref 0.0–1.2)
CO2: 25 mmol/L (ref 20–29)
Calcium: 9.7 mg/dL (ref 8.6–10.2)
Chloride: 96 mmol/L (ref 96–106)
Creatinine, Ser: 0.83 mg/dL (ref 0.76–1.27)
GFR calc Af Amer: 100 mL/min/{1.73_m2} (ref >59)
GFR calc non Af Amer: 86 mL/min/{1.73_m2} (ref >59)
Globulin, Total: 2.3 g/dL (ref 1.5–4.5)
Glucose: 119 mg/dL — ABNORMAL HIGH (ref 65–99)
Potassium: 4.1 mmol/L (ref 3.5–5.2)
Sodium: 137 mmol/L (ref 134–144)
Total Protein: 7.3 g/dL (ref 6.0–8.5)

## 2020-11-17 LAB — LIPID PANEL WITH LDL/HDL RATIO
Cholesterol, Total: 227 mg/dL — ABNORMAL HIGH (ref 100–199)
HDL: 70 mg/dL (ref >39)
LDL Chol Calc (NIH): 124 mg/dL — ABNORMAL HIGH (ref 0–99)
LDL/HDL Ratio: 1.8 ratio (ref 0.0–3.6)
Triglycerides: 192 mg/dL — ABNORMAL HIGH (ref 0–149)
VLDL Cholesterol Cal: 33 mg/dL (ref 5–40)

## 2020-11-17 LAB — TSH: TSH: 1.64 u[IU]/mL (ref 0.450–4.500)

## 2020-11-21 ENCOUNTER — Telehealth: Payer: Self-pay

## 2020-11-21 MED ORDER — ATORVASTATIN CALCIUM 40 MG PO TABS
40.0000 mg | ORAL_TABLET | Freq: Every evening | ORAL | 2 refills | Status: DC
Start: 2020-11-21 — End: 2020-12-31

## 2020-11-21 NOTE — Telephone Encounter (Signed)
-----   Message from Matilde Sprang, RN sent at 11/20/2020  3:57 PM EST ----- Pt given lab results per notes of Vernie Murders, [place] on 11/19/20. Pt verbalized understanding. Will need new prescription sent to pharmacy, advised to take 2 of the 20 mg bottle until all gone, he verbalized understanding.   Pharmacy: [facility], [place]  Phone:  [phone] Fax:  [phone]

## 2020-12-06 DIAGNOSIS — M19011 Primary osteoarthritis, right shoulder: Secondary | ICD-10-CM | POA: Diagnosis not present

## 2020-12-06 DIAGNOSIS — M25511 Pain in right shoulder: Secondary | ICD-10-CM | POA: Diagnosis not present

## 2020-12-06 DIAGNOSIS — Z9889 Other specified postprocedural states: Secondary | ICD-10-CM | POA: Diagnosis not present

## 2020-12-14 ENCOUNTER — Other Ambulatory Visit: Payer: Self-pay | Admitting: Cardiology

## 2020-12-26 ENCOUNTER — Other Ambulatory Visit: Payer: Self-pay | Admitting: Physician Assistant

## 2020-12-31 ENCOUNTER — Other Ambulatory Visit: Payer: Self-pay

## 2020-12-31 ENCOUNTER — Encounter: Payer: Self-pay | Admitting: Cardiology

## 2020-12-31 ENCOUNTER — Ambulatory Visit: Payer: Medicare HMO | Admitting: Cardiology

## 2020-12-31 VITALS — BP 134/78 | HR 65 | Ht 71.0 in | Wt 224.8 lb

## 2020-12-31 DIAGNOSIS — Z79899 Other long term (current) drug therapy: Secondary | ICD-10-CM | POA: Diagnosis not present

## 2020-12-31 DIAGNOSIS — I071 Rheumatic tricuspid insufficiency: Secondary | ICD-10-CM

## 2020-12-31 DIAGNOSIS — I480 Paroxysmal atrial fibrillation: Secondary | ICD-10-CM | POA: Diagnosis not present

## 2020-12-31 DIAGNOSIS — I340 Nonrheumatic mitral (valve) insufficiency: Secondary | ICD-10-CM | POA: Diagnosis not present

## 2020-12-31 MED ORDER — MAGNESIUM OXIDE -MG SUPPLEMENT 400 (240 MG) MG PO TABS: 400.0000 mg | ORAL_TABLET | Freq: Two times a day (BID) | ORAL | 2 refills | Status: DC

## 2020-12-31 MED ORDER — POTASSIUM CHLORIDE CRYS ER 20 MEQ PO TBCR: 40.0000 meq | EXTENDED_RELEASE_TABLET | Freq: Every day | ORAL | 2 refills | Status: DC

## 2020-12-31 NOTE — Progress Notes (Signed)
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  He initially underwent surgery for left femoral nail removal in 2018. During surgery he went into atrial fib with RVR which was new for him. 2-D echo showed normal LV function EF 55-60% with mildly dilated left atrium and mild LVH. CHADSVASC was 2 for hypertension and age and he was started on Eliquis 5 mg BID. He eventually underwent TEE/DCCV 12/2017 with conversion to NSR. TEE had shown moderate MR/TR as well. Since that time he has required additional DCCV in 12/2018 for recurrence. He has required medication adjustment since then. He was ultimately placed on flecainide in 08/2019 and underwent repeat DCCV 08/2019. He does not have prior ischemic assessment, although plan for future POET was mentioned in 08/2019 notes. Last personally reviewed labs 08/2019 showed Hgb 14.1, MCV 100, K 3.7, Cr 1.08, glucose 108, TSH wnl, LFTs wnl, trig 152, LDL 92 (followed by primary care). Also reviewed result note, carotid duplex 12/2017 with 1-39% stenosis on the left, felt to be normal by Dr. [name].  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  He has been compliant with his meds and denies any bleeding.  No palpitations.  He would like to discontinue taking flecainide.  He also denies any chest pain shortness of breath no lower extremity edema.   No presyncope, syncope or falls.   Past Medical History:  Diagnosis Date  . 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 Anemia    hx of one time  . Arthritis    OA  . Closed intertrochanteric fracture of hip, left, initial encounter ([place]) 10/10/2016  . Colon, diverticulosis 05/31/2009  . Difficulty sleeping   . Diverticulitis large intestine 05/31/2009  . Diverticulosis 2014  . 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 First degree AV block   . GERD (gastroesophageal reflux disease)   . GI symptom    had nausea / vomited once / frequent stools / getting better  . Hyperlipidemia   . Hypertension   . Hypothyroidism   . Mitral regurgitation   . Mood changes   . Pneumonia    hx of  . Tricuspid regurgitation     Past Surgical History:  Procedure Laterality Date  . CARDIOVERSION N/A 01/12/2018   Procedure: CARDIOVERSION;  Surgeon: Dorothy Spark, MD;  Location: [facility] ENDOSCOPY;  Service: Cardiovascular;  Laterality: N/A;  . CARDIOVERSION N/A 01/26/2019   Procedure: CARDIOVERSION;  Surgeon: Jerline Pain, MD;  Location: [facility] ENDOSCOPY;  Service: Cardiovascular;  Laterality: N/A;  . CARDIOVERSION N/A 09/30/2019   Procedure: CARDIOVERSION;  Surgeon: Lelon Perla, MD;  Location: [facility] ENDOSCOPY;  Service: Cardiovascular;  Laterality: N/A;  . COLONOSCOPY  2014  . FEMUR IM NAIL Left 10/11/2016   Procedure: INTRAMEDULLARY (IM) NAIL FEMORAL;  Surgeon: [name], MD;  Location: [facility]  ORS;  Service: Orthopedics;  Laterality: Left;  . HAND SURGERY   1973 / 2010 / 2015   right to release tendons  . HARDWARE REMOVAL Left 10/05/2017   Procedure: Removal of left femoral nail;  Surgeon: [name], MD;  Location: [facility] ORS;  Service: Orthopedics;  Laterality: Left;  90 mins  . HEMATOMA EVACUATION Left 01/19/2018   Procedure: Irrigation and debridement, Evacuation of left total knee hematoma;  Surgeon: [name], MD;  Location: [facility] ORS;  Service: Orthopedics;  Laterality: Left;   . KNEE ARTHROSCOPY  1984 & 2001   twice  . NASAL SEPTUM SURGERY    . removal Left femoral nail      10/05/17 Dr. [name]  . [place] & 2006   Right and left  . TEE WITH CARDIOVERSION  01/12/2018  . TEE WITHOUT CARDIOVERSION N/A 01/12/2018   Procedure: TRANSESOPHAGEAL ECHOCARDIOGRAM (TEE);  Surgeon: Dorothy Spark, MD;  Location: [facility] ENDOSCOPY;  Service: Cardiovascular;  Laterality: N/A;  . TONSILLECTOMY    . TOTAL KNEE ARTHROPLASTY Right 01/09/2015   Procedure: RIGHT TOTAL KNEE ARTHROPLASTY;  Surgeon: [name], MD;  Location: [facility] ORS;  Service: Orthopedics;  Laterality: Right;  . TOTAL KNEE ARTHROPLASTY Left 11/23/2017   Procedure: LEFT TOTAL KNEE ARTHROPLASTY;  Surgeon: [name], MD;  Location: [facility] ORS;  Service: Orthopedics;  Laterality: Left;  90 mins    Current Medications: Current Meds  Medication Sig  . acetaminophen (TYLENOL) 500 MG tablet Take 1,500 mg by mouth at bedtime as needed for mild pain or moderate pain.   Marland Kitchen amLODipine (NORVASC) 10 MG tablet TAKE 1 TABLET EVERY DAY  . atorvastatin (LIPITOR) 20 MG tablet Take 20 mg by mouth daily.  . Cholecalciferol (VITAMIN D) 2000 UNITS CAPS Take 2,000 Units by mouth every evening.   Marland Kitchen ELIQUIS 5 MG TABS tablet TAKE 1 TABLET TWICE DAILY  . flecainide (TAMBOCOR) 50 MG tablet Take 1 tablet (50 mg total) by mouth 2 (two) times daily. Please keep upcoming appt in January 2022 with Dr. [name] for future refills. Thank you  . ibuprofen (ADVIL) 200 MG tablet Take 800 mg by mouth daily as needed for moderate pain.  Marland Kitchen levothyroxine (SYNTHROID) 150 MCG tablet TAKE 1 TABLET EVERY DAY  . Magnesium Oxide 400 (240 Mg) MG TABS Take 1 tablet (400 mg total) by mouth 2 (two) times daily.  . Menthol, Topical Analgesic, (BLUE-EMU MAXIMUM STRENGTH EX) Apply 1 application topically at bedtime as needed (pain).  . metoprolol tartrate (LOPRESSOR) 50 MG tablet Take 1 tablet (50 mg total) by mouth 2 (two) times daily.  Please keep upcoming appointment in Jan 2022 for future refills. Thank you  . Multiple Vitamin (MULTIVITAMIN WITH MINERALS) TABS tablet Take 1 tablet by mouth daily.   . Multiple Vitamins-Minerals (PRESERVISION AREDS 2) CAPS Take 1 capsule by mouth 2 (two) times daily.   . pantoprazole (PROTONIX) 40 MG tablet TAKE 1 TABLET EVERY DAY  . polycarbophil (FIBERCON) 625 MG tablet Take 625 mg by mouth daily.   . potassium chloride SA (KLOR-CON M20) 20 MEQ tablet Take 2 tablets (40 mEq total) by mouth daily. Please keep upcoming appt for future refills. Thank you!  . valsartan-hydrochlorothiazide (DIOVAN-HCT) 320-25 MG tablet TAKE 1 TABLET EVERY DAY  . venlafaxine XR (EFFEXOR-XR) 75 MG 24 hr capsule TAKE 1 CAPSULE EVERY DAY      Allergies:   Sulfa antibiotics   Social History   Socioeconomic History  . Marital status:  Married    Spouse name: Not on file  . Number of children: 2  . Years of education: Not on file  . Highest education level: Professional school degree (e.g., MD, DDS, DVM, JD)  Occupational History  . Occupation: retired, has degree in physiology  Tobacco Use  . Smoking status: Former Smoker    Quit date: 12/29/1968    Years since quitting: 52.0  . Smokeless tobacco: Never Used  Vaping Use  . Vaping Use: Never used  Substance and Sexual Activity  . Alcohol use: Yes    Alcohol/week: 12.0 standard drinks    Types: 12 Cans of beer per week    Comment: weekly  . Drug use: No  . Sexual activity: Yes  Other Topics Concern  . Not on file  Social History Narrative   Pt lives in [place] w/ wife.   Social Determinants of Health   Financial Resource Strain: Not on file  Food Insecurity: Not on file  Transportation Needs: Not on file  Physical Activity: Not on file  Stress: Not on file  Social Connections: Not on file     Family History:  The patient's family history includes Breast cancer in his mother. There is no history of Colon cancer, Esophageal cancer, Stomach  cancer, or Rectal cancer.  ROS:   Please see the history of present illness.  All other systems are reviewed and otherwise negative.    EKGs/Labs/Other Studies Reviewed:    Studies reviewed are outlined and summarized above.  TEE 12/2017 Study Conclusions  - Left ventricle: Systolic function was normal. The estimated   ejection fraction was in the range of 55% to 60%. Wall motion was   normal; there were no regional wall motion abnormalities. - Aortic valve: There was mild regurgitation. - Mitral valve: There was moderate regurgitation. - Left atrium: The atrium was dilated. No evidence of thrombus in   the atrial cavity or appendage. No evidence of thrombus in the   atrial cavity or appendage. No evidence of thrombus in the atrial   cavity or appendage. - Right atrium: The atrium was dilated. No evidence of thrombus in   the atrial cavity or appendage. - Atrial septum: No defect or patent foramen ovale was identified. - Tricuspid valve: There was moderate regurgitation.  Impressions:  - No cardiac source of emboli was indentified. The study was   followed by a successful cardioversion.    EKG:  EKG is ordered today, personally reviewed, demonstrating NSR  76bpm, 1st degree AV block, nonspecific TW changes, left axis deviation, QRS 52ms - similar to prior  Recent Labs: 02/10/2020: Magnesium 1.9 11/16/2020: ALT 62; BUN 10; Creatinine, Ser 0.83; Hemoglobin 15.6; Platelets 270; Potassium 4.1; Sodium 137; TSH 1.640  Recent Lipid Panel    Component Value Date/Time   CHOL 227 (H) 11/16/2020 1449   TRIG 192 (H) 11/16/2020 1449   HDL 70 11/16/2020 1449   CHOLHDL 2.9 08/04/2019 1359   CHOLHDL 3.8 10/11/2016 0337   VLDL UNABLE TO CALCULATE IF TRIGLYCERIDE OVER 400 mg/dL 93/81/0175 1025   LDLCALC 124 (H) 11/16/2020 1449    PHYSICAL EXAM:    VS:  BP 134/78   Pulse 65   Ht 5\' 11"  (1.803 m)   Wt 224 lb 12.8 oz (102 kg)   SpO2 96%   BMI 31.35 kg/m   BMI: Body mass index  is 31.35 kg/m.  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 Paroxysmal atrial fibrillation on flecainide -the patient remains in sinus rhythm, he has not had any episodes since cardioversion in 2019 when flecainide was added.  He would like to discontinue flecainide.  We will discontinue, continue metoprolol, CHADSVASC 2 so will continue Eliquis. He limits NSAIDS. Bleeding precautions reviewed. 2. Moderate mitral regurgitation/tricuspid regurgitation - noted on echo in 2019.  We will repeat an echocardiogram now.  3. Essential HTN -repeat blood pressure well controlled.  Disposition: F/u with Dr. [name] in 6 months.  Medication Adjustments/Labs and Tests Ordered: Current medicines are reviewed at length with the patient today.  Concerns regarding medicines are outlined above. Medication changes, Labs and Tests ordered today are summarized above and listed in the Patient Instructions accessible in Encounters.   Signed, Ena Dawley, MD  12/31/2020 3:30 PM    [facility] [address], [place], [place]  [address] Phone: [phone]; Fax: [phone]

## 2020-12-31 NOTE — Patient Instructions (Signed)
Medication Instructions:   STOP TAKING FLECAINIDE NOW  *If you need a refill on your cardiac medications before your next appointment, please call your pharmacy*   Testing/Procedures:  Your physician has requested that you have an echocardiogram. Echocardiography is a painless test that uses sound waves to create images of your heart. It provides your doctor with information about the size and shape of your heart and how well your heart's chambers and valves are working. This procedure takes approximately one hour. There are no restrictions for this procedure.   Follow-Up: At [facility], you and your health needs are our priority.  As part of our continuing mission to provide you with exceptional heart care, we have created designated Provider Care Teams.  These Care Teams include your primary Cardiologist (physician) and Advanced Practice Providers (APPs -  Physician Assistants and Nurse Practitioners) who all work together to provide you with the care you need, when you need it.  We recommend signing up for the patient portal called "MyChart".  Sign up information is provided on this After Visit Summary.  MyChart is used to connect with patients for Virtual Visits (Telemedicine).  Patients are able to view lab/test results, encounter notes, upcoming appointments, etc.  Non-urgent messages can be sent to your provider as well.   To learn more about what you can do with MyChart, go to [email or web address].    Your next appointment:   6 month(s)  The format for your next appointment:   In Person  Provider:   Tobias Alexander, MD

## 2021-01-02 ENCOUNTER — Other Ambulatory Visit: Payer: Self-pay | Admitting: Cardiology

## 2021-01-02 NOTE — Telephone Encounter (Signed)
Eliquis 5mg  refill request received. Patient is 75 years old, weight-102kg, Crea-0.83 on 11/16/2020, Diagnosis-Afib, and last seen by Dr. 11/18/2020 on 1/3/20222. Dose is appropriate based on dosing criteria. Will send in refill to requested pharmacy.

## 2021-01-21 ENCOUNTER — Other Ambulatory Visit: Payer: Self-pay

## 2021-01-21 ENCOUNTER — Ambulatory Visit (HOSPITAL_COMMUNITY): Payer: Medicare HMO | Attending: Cardiology

## 2021-01-21 DIAGNOSIS — Z79899 Other long term (current) drug therapy: Secondary | ICD-10-CM | POA: Diagnosis not present

## 2021-01-21 DIAGNOSIS — I071 Rheumatic tricuspid insufficiency: Secondary | ICD-10-CM | POA: Diagnosis not present

## 2021-01-21 DIAGNOSIS — I340 Nonrheumatic mitral (valve) insufficiency: Secondary | ICD-10-CM | POA: Diagnosis not present

## 2021-01-21 LAB — ECHOCARDIOGRAM COMPLETE
AR max vel: 2.18 cm2
AV Area VTI: 2.04 cm2
AV Area mean vel: 1.96 cm2
AV Mean grad: 7 mmHg
AV Peak grad: 12.4 mmHg
Ao pk vel: 1.76 m/s
Area-P 1/2: 3.85 cm2
P 1/2 time: 478 msec
S' Lateral: 2.8 cm

## 2021-02-18 ENCOUNTER — Other Ambulatory Visit: Payer: Self-pay | Admitting: Cardiology

## 2021-02-21 ENCOUNTER — Other Ambulatory Visit: Payer: Self-pay | Admitting: Family Medicine

## 2021-03-11 ENCOUNTER — Other Ambulatory Visit: Payer: Self-pay | Admitting: Family Medicine

## 2021-03-21 ENCOUNTER — Other Ambulatory Visit: Payer: Self-pay | Admitting: Cardiology

## 2021-04-01 ENCOUNTER — Telehealth: Payer: Self-pay

## 2021-04-01 NOTE — Telephone Encounter (Signed)
[facility] faxed refill request for the following medications:  venlafaxine XR (EFFEXOR-XR) 75 MG 24 hr capsule   Please advise.

## 2021-04-02 MED ORDER — VENLAFAXINE HCL ER 75 MG PO CP24: 75.0000 mg | ORAL_CAPSULE | Freq: Every day | ORAL | 0 refills | Status: DC

## 2021-04-24 ENCOUNTER — Other Ambulatory Visit: Payer: Self-pay | Admitting: Family Medicine

## 2021-04-24 NOTE — Telephone Encounter (Signed)
Requested Prescriptions  Pending Prescriptions Disp Refills  . valsartan-hydrochlorothiazide (DIOVAN-HCT) 320-25 MG tablet [Pharmacy Med Name: VALSARTAN/HYDROCHLOROTHIAZIDE 320-25 MG Tablet] 90 tablet 0    Sig: TAKE 1 TABLET EVERY DAY     Cardiovascular: ARB + Diuretic Combos Passed - 04/24/2021  3:23 PM      Passed - K in normal range and within 180 days    Potassium  Date Value Ref Range Status  11/16/2020 4.1 3.5 - 5.2 mmol/L Final         Passed - Na in normal range and within 180 days    Sodium  Date Value Ref Range Status  11/16/2020 137 134 - 144 mmol/L Final         Passed - Cr in normal range and within 180 days    Creatinine, Ser  Date Value Ref Range Status  11/16/2020 0.83 0.76 - 1.27 mg/dL Final         Passed - Ca in normal range and within 180 days    Calcium  Date Value Ref Range Status  11/16/2020 9.7 8.6 - 10.2 mg/dL Final         Passed - Patient is not pregnant      Passed - Last BP in normal range    BP Readings from Last 1 Encounters:  12/31/20 134/78         Passed - Valid encounter within last 6 months    Recent Outpatient Visits          5 months ago Primary hypertension   [facility], Vickki Muff, PA-C   1 year ago Chronic rhinitis   [facility], Vickki Muff, PA-C   2 years ago Atrial fibrillation status post cardioversion [facility])   [place], Vickki Muff, PA-C   2 years ago Essential hypertension   [facility], Vickki Muff, PA-C   2 years ago Benign essential HTN   [facility], Vickki Muff, [place]

## 2021-05-20 ENCOUNTER — Other Ambulatory Visit: Payer: Self-pay | Admitting: Family Medicine

## 2021-05-20 NOTE — Telephone Encounter (Signed)
Requested Prescriptions  Pending Prescriptions Disp Refills  . levothyroxine (SYNTHROID) 150 MCG tablet [Pharmacy Med Name: LEVOTHYROXINE SODIUM 150 MCG Tablet] 90 tablet 1    Sig: TAKE 1 TABLET EVERY DAY     Endocrinology:  Hypothyroid Agents Failed - 05/20/2021  8:25 PM      Failed - TSH needs to be rechecked within 3 months after an abnormal result. Refill until TSH is due.      Passed - TSH in normal range and within 360 days    TSH  Date Value Ref Range Status  11/16/2020 1.640 0.450 - 4.500 uIU/mL Final         Passed - Valid encounter within last 12 months    Recent Outpatient Visits          6 months ago Primary hypertension   [facility], Vickki Muff, PA-C   1 year ago Chronic rhinitis   [place], PA-C   2 years ago Atrial fibrillation status post cardioversion [facility])   [facility], Vickki Muff, PA-C   2 years ago Essential hypertension   [facility], Vickki Muff, PA-C   2 years ago Benign essential HTN   [facility], Vickki Muff, PA-C             . pantoprazole ([place]) 40 MG tablet [Pharmacy Med Name: PANTOPRAZOLE SODIUM 40 MG Tablet Delayed Release] 90 tablet 1    Sig: TAKE [place]     Gastroenterology: Proton Pump Inhibitors Passed - 05/20/2021  8:25 PM      Passed - Valid encounter within last 12 months    Recent Outpatient Visits          6 months ago Primary hypertension   [facility], Vickki Muff, PA-C   1 year ago Chronic rhinitis   [facility], Vickki Muff, PA-C   2 years ago Atrial fibrillation status post cardioversion [facility])   [facility], Vickki Muff, PA-C   2 years ago Essential hypertension   [facility], Vickki Muff, PA-C   2 years ago Benign essential HTN   [facility], Vickki Muff, [place]

## 2021-06-03 ENCOUNTER — Other Ambulatory Visit: Payer: Self-pay | Admitting: Family Medicine

## 2021-06-03 NOTE — Telephone Encounter (Signed)
Requested medication (s) are due for refill today:no  Requested medication (s) are on the active medication list: yes  Last refill: 04/02/2021  Future visit scheduled:No  Notes to clinic:  overdue for follow up appt Vm left for patient to contact office    Requested Prescriptions  Pending Prescriptions Disp Refills   venlafaxine XR (EFFEXOR-XR) 75 MG 24 hr capsule [Pharmacy Med Name: VENLAFAXINE HYDROCHLORIDEER 75 MG Capsule Extended Release 24 Hour] 90 capsule 0    Sig: TAKE 1 CAPSULE EVERY DAY.  OFFICE VISIT NEEDED FOR MORE REFILLS      Psychiatry: Antidepressants - SNRI - desvenlafaxine & venlafaxine Failed - 06/03/2021  1:34 PM      Failed - LDL in normal range and within 360 days    LDL Chol Calc (NIH)  Date Value Ref Range Status  11/16/2020 124 (H) 0 - 99 mg/dL Final          Failed - Total Cholesterol in normal range and within 360 days    Cholesterol, Total  Date Value Ref Range Status  11/16/2020 227 (H) 100 - 199 mg/dL Final          Failed - Triglycerides in normal range and within 360 days    Triglycerides  Date Value Ref Range Status  11/16/2020 192 (H) 0 - 149 mg/dL Final          Failed - Completed PHQ-2 or PHQ-9 in the last 360 days      Failed - Valid encounter within last 6 months    Recent Outpatient Visits           6 months ago Primary hypertension   [place], Vickki Muff, PA-C   1 year ago Chronic rhinitis   [facility], Vickki Muff, PA-C   2 years ago Atrial fibrillation status post cardioversion [facility])   [facility], Vickki Muff, PA-C   2 years ago Essential hypertension   [facility], Vickki Muff, PA-C   2 years ago Benign essential HTN   [facility], Vickki Muff, PA-C                Passed - Last BP in normal range    BP Readings from Last 1 Encounters:  12/31/20 134/78

## 2021-06-24 ENCOUNTER — Other Ambulatory Visit: Payer: Self-pay | Admitting: Family Medicine

## 2021-07-24 ENCOUNTER — Other Ambulatory Visit: Payer: Self-pay | Admitting: Family Medicine

## 2021-07-24 NOTE — Telephone Encounter (Signed)
Requested medications are due for refill today.  unknown  Requested medications are on the active medications list.  yes  Last refill. unknown  Future visit scheduled.   no  Notes to clinic.  Medication is noted as historical med, and D/C'd on 12/31/2020.

## 2021-08-21 ENCOUNTER — Other Ambulatory Visit: Payer: Self-pay | Admitting: Neurology

## 2021-08-21 DIAGNOSIS — Z79899 Other long term (current) drug therapy: Secondary | ICD-10-CM

## 2021-08-21 DIAGNOSIS — I071 Rheumatic tricuspid insufficiency: Secondary | ICD-10-CM

## 2021-08-21 DIAGNOSIS — I340 Nonrheumatic mitral (valve) insufficiency: Secondary | ICD-10-CM

## 2021-08-21 MED ORDER — POTASSIUM CHLORIDE CRYS ER 20 MEQ PO TBCR: 40.0000 meq | EXTENDED_RELEASE_TABLET | Freq: Every day | ORAL | 0 refills | Status: DC

## 2021-09-05 ENCOUNTER — Other Ambulatory Visit: Payer: Self-pay

## 2021-09-05 DIAGNOSIS — I071 Rheumatic tricuspid insufficiency: Secondary | ICD-10-CM

## 2021-09-05 DIAGNOSIS — I340 Nonrheumatic mitral (valve) insufficiency: Secondary | ICD-10-CM

## 2021-09-05 DIAGNOSIS — Z79899 Other long term (current) drug therapy: Secondary | ICD-10-CM

## 2021-09-05 MED ORDER — MAGNESIUM OXIDE -MG SUPPLEMENT 400 (240 MG) MG PO TABS: 400.0000 mg | ORAL_TABLET | Freq: Two times a day (BID) | ORAL | 0 refills | Status: DC

## 2021-09-05 MED ORDER — APIXABAN 5 MG PO TABS: 5.0000 mg | ORAL_TABLET | Freq: Two times a day (BID) | ORAL | 1 refills | Status: DC

## 2021-09-05 NOTE — Telephone Encounter (Signed)
Pt last saw Dr [name] 12/31/20, was due for 6 month f/u in July with Dr Johney Frame.  Placed not on refill will need OV for future refills.  Last labs 11/16/20 Creat 0.83, age 75, weight 102kg, based on specified criteria pt is on appropriate dosage of Eliquis '5mg'$  BID.  Will refill rx.

## 2021-09-16 ENCOUNTER — Other Ambulatory Visit: Payer: Self-pay | Admitting: Family Medicine

## 2021-09-17 ENCOUNTER — Telehealth: Payer: Self-pay | Admitting: *Deleted

## 2021-09-17 NOTE — Chronic Care Management (AMB) (Signed)
  Chronic Care Management   Outreach Note  09/17/2021 Name: Eugene White MRN: [id number] DOB: 1946/10/16  Eugene White is a 75 y.o. year old male who is a primary care patient of Chrismon, Vickki Muff, PA-C. I reached out to [name] by phone today in response to a referral sent by [name]'s PCP Chrismon, Vickki Muff, PA-C     An unsuccessful telephone outreach was attempted today. The patient was referred to the case management team for assistance with care management and care coordination.   Follow Up Plan: If patient returns call to provider office, please advise to call Embedded Care Management Care Guide Dontrey Snellgrove at [place], [place] Management  Direct Dial: [phone]

## 2021-09-20 NOTE — Chronic Care Management (AMB) (Signed)
  Chronic Care Management   Outreach Note  09/20/2021 Name: Eugene White MRN: [id number] DOB: [birth year]  Eugene White is a 75 y.o. year old male who is a primary care patient of Chrismon, Vickki Muff, PA-C. I reached out to [name] by phone today in response to a referral sent by [name]'s PCP Chrismon, Vickki Muff, PA-C     A second unsuccessful telephone outreach was attempted today. The patient was referred to the case management team for assistance with care management and care coordination.   Follow Up Plan: A HIPAA compliant phone message was left for the patient providing contact information and requesting a return call.  If patient returns call to provider office, please advise to call Embedded Care Management Care Guide Steffany Schoenfelder at [place], [place] Management  Direct Dial: [phone]

## 2021-09-30 ENCOUNTER — Other Ambulatory Visit: Payer: Self-pay | Admitting: Physician Assistant

## 2021-09-30 DIAGNOSIS — I071 Rheumatic tricuspid insufficiency: Secondary | ICD-10-CM

## 2021-09-30 DIAGNOSIS — M19011 Primary osteoarthritis, right shoulder: Secondary | ICD-10-CM | POA: Diagnosis not present

## 2021-09-30 DIAGNOSIS — I340 Nonrheumatic mitral (valve) insufficiency: Secondary | ICD-10-CM

## 2021-09-30 DIAGNOSIS — Z9889 Other specified postprocedural states: Secondary | ICD-10-CM | POA: Diagnosis not present

## 2021-09-30 DIAGNOSIS — Z79899 Other long term (current) drug therapy: Secondary | ICD-10-CM

## 2021-10-04 NOTE — Chronic Care Management (AMB) (Signed)
  Chronic Care Management   Outreach Note  10/04/2021 Name: Eugene White MRN: [id number] DOB: Apr 18, 1946  Eugene White is a 75 y.o. year old male who is a primary care patient of Chrismon, Vickki Muff, PA-C (Inactive). I reached out to Eugene White by phone today in response to a referral sent by Eugene White's primary care provider.  Third unsuccessful telephone outreach was attempted today. The patient was referred to the case management team for assistance with care management and care coordination.   Follow Up Plan: We have been unable to make contact with the patient for follow up. The care management team is available to follow up with the patient after provider conversation with the patient regarding recommendation for care management engagement and subsequent re-referral to the care management team.   Julian Hy, [place] Management  Direct Dial: [phone]

## 2021-10-10 DIAGNOSIS — M25511 Pain in right shoulder: Secondary | ICD-10-CM | POA: Diagnosis not present

## 2021-10-17 DIAGNOSIS — M25511 Pain in right shoulder: Secondary | ICD-10-CM | POA: Diagnosis not present

## 2021-10-18 DIAGNOSIS — M25511 Pain in right shoulder: Secondary | ICD-10-CM | POA: Diagnosis not present

## 2021-10-22 DIAGNOSIS — M25511 Pain in right shoulder: Secondary | ICD-10-CM | POA: Diagnosis not present

## 2021-10-24 DIAGNOSIS — M25511 Pain in right shoulder: Secondary | ICD-10-CM | POA: Diagnosis not present

## 2021-10-25 ENCOUNTER — Other Ambulatory Visit: Payer: Self-pay | Admitting: Family Medicine

## 2021-10-25 ENCOUNTER — Other Ambulatory Visit: Payer: Self-pay | Admitting: *Deleted

## 2021-10-25 MED ORDER — VENLAFAXINE HCL ER 75 MG PO CP24: ORAL_CAPSULE | ORAL | 0 refills | Status: DC

## 2021-10-25 MED ORDER — AMLODIPINE BESYLATE 10 MG PO TABS: 10.0000 mg | ORAL_TABLET | Freq: Every day | ORAL | 2 refills | Status: DC

## 2021-10-25 NOTE — Telephone Encounter (Signed)
Please advise refill?  LOV: 11/13/2020 NOV: None Last refill: 06/03/2021 # 90 w/0 refills

## 2021-10-25 NOTE — Telephone Encounter (Signed)
[facility] faxed refill request for the following medications:  venlafaxine XR (EFFEXOR-XR) 75 MG 24 hr capsule    Please advise.

## 2021-10-28 DIAGNOSIS — M25511 Pain in right shoulder: Secondary | ICD-10-CM | POA: Diagnosis not present

## 2021-10-31 ENCOUNTER — Other Ambulatory Visit: Payer: Self-pay

## 2021-10-31 DIAGNOSIS — I071 Rheumatic tricuspid insufficiency: Secondary | ICD-10-CM

## 2021-10-31 DIAGNOSIS — Z79899 Other long term (current) drug therapy: Secondary | ICD-10-CM

## 2021-10-31 DIAGNOSIS — M25511 Pain in right shoulder: Secondary | ICD-10-CM | POA: Diagnosis not present

## 2021-10-31 DIAGNOSIS — I340 Nonrheumatic mitral (valve) insufficiency: Secondary | ICD-10-CM

## 2021-10-31 MED ORDER — POTASSIUM CHLORIDE CRYS ER 20 MEQ PO TBCR: 40.0000 meq | EXTENDED_RELEASE_TABLET | Freq: Every day | ORAL | 0 refills | Status: DC

## 2021-11-07 DIAGNOSIS — M25511 Pain in right shoulder: Secondary | ICD-10-CM | POA: Diagnosis not present

## 2021-12-26 ENCOUNTER — Encounter: Payer: Self-pay | Admitting: *Deleted

## 2021-12-26 ENCOUNTER — Telehealth: Payer: Medicare HMO | Admitting: Physician Assistant

## 2021-12-26 ENCOUNTER — Ambulatory Visit: Payer: Self-pay | Admitting: *Deleted

## 2021-12-26 NOTE — Telephone Encounter (Signed)
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 Patient has not tested but is with symptoms that began after a cruise with others who are now positive. Home care advice provided with health precautions and isolation discussed.When to seek immediate treatment reviewed. Virtual video information provided. Patient will do virtual video visit today-is interested in anti-viral medication.      Reason for Disposition  [1] Fever > 101 F (38.3 C) AND [2] age > 60 years  Answer Assessment - Initial Assessment Questions 1. COVID-19 DIAGNOSIS: "Who made your COVID-19 diagnosis?" "Was it confirmed by a positive lab test or self-test?" If not diagnosed by a doctor (or NP/PA), ask "Are there lots of cases (community spread) where you live?" Note: See public health department website, if unsure. Wife is positive covid along with 2 others the patient was with on a cruise last week. 2. COVID-19 EXPOSURE: "Was there any known exposure to COVID before the symptoms began?" CDC Definition of close contact: within 6 feet (2 meters) for a total of 15 minutes or more over a 24-hour period.      yes 3. ONSET: "When did the COVID-19 symptoms start?"      Monday 4. WORST SYMPTOM: "What is your worst symptom?" (e.g., cough, fever, shortness of breath, muscle aches)     Unsure-wife reporting symptoms 5. COUGH: "Do you have a cough?" If Yes, ask: "How bad is the cough?"       yes 6. FEVER: "Do you have a fever?" If Yes, ask: "What is your temperature, how was it measured, and when did it start?"     yes 7. RESPIRATORY STATUS: "Describe your breathing?" (e.g.,  shortness of breath, wheezing, unable to speak)      unsure 8. BETTER-SAME-WORSE: "Are you getting better, staying the same or getting worse compared to yesterday?"  If getting worse, ask, "In what way?"      9. HIGH RISK DISEASE: "Do you have any chronic medical problems?" (e.g., asthma, heart or lung disease, weak immune system, obesity, etc.)      10. VACCINE: "Have you had the COVID-19 vaccine?" If Yes, ask: "Which one, how many shots, when did you get it?"        11. BOOSTER: "Have you received your COVID-19 booster?" If Yes, ask: "Which one and when did you get it?"       Yes, all boosters 12. PREGNANCY: "Is there any chance you are pregnant?" "When was your last menstrual period?"       na 13. OTHER SYMPTOMS: "Do you have any other symptoms?"  (e.g., chills, fatigue, headache, loss of smell or taste, muscle pain, sore throat)       14. O2 SATURATION MONITOR:  "Do you use an oxygen saturation monitor (pulse oximeter) at home?" If Yes, ask "What is your reading (oxygen level) today?" "What is your usual oxygen saturation reading?" (e.g., 95%)       na  Protocols used: Coronavirus (COVID-19) Diagnosed or Suspected-A-AH

## 2021-12-26 NOTE — Telephone Encounter (Signed)
This encounter was created in error - please disregard. Please see other NT encounter for today.

## 2021-12-26 NOTE — Progress Notes (Signed)
The patient no-showed for appointment despite this provider sending direct link, reaching out via phone with no response and waiting for at least 10 minutes from appointment time for patient to join. They will be marked as a NS for this appointment/time.  ? ?Sina Sumpter Cody Meliss Fleek, PA-C ? ? ? ?

## 2021-12-27 ENCOUNTER — Ambulatory Visit: Payer: Self-pay | Admitting: *Deleted

## 2021-12-27 DIAGNOSIS — U071 COVID-19: Secondary | ICD-10-CM

## 2021-12-27 MED ORDER — MOLNUPIRAVIR EUA 200MG CAPSULE: 4.0000 | ORAL_CAPSULE | Freq: Two times a day (BID) | ORAL | 0 refills | Status: AC

## 2021-12-27 NOTE — Addendum Note (Signed)
Addended by: Birdie Sons on: 12/27/2021 03:01 PM   Modules accepted: Orders

## 2021-12-27 NOTE — Telephone Encounter (Signed)
Have sent prescription for molnupiravir to [facility].

## 2021-12-27 NOTE — Telephone Encounter (Signed)
Patient's wife advised

## 2021-12-27 NOTE — Telephone Encounter (Signed)
°  Chief Complaint: coughing productive.   Positive covid   Does he need an antiviral? Symptoms: coughing, sore throat, low grade fever Frequency: worse at night Pertinent Negatives: Patient denies shortness of breath Disposition: [] ED /[] Urgent Care (no appt availability in office) / [] Appointment(In office/virtual)/ []  [place] [facility]/ [] Home Care/ [] Refused Recommended Disposition /[] [place] [facility]/ [x]  Follow-up with PCP Additional Notes: Someone will call pt's wife back when office opens from lunch to further advise her.   She spoke with a Anderson Malta yesterday.      Her phone number is [phone].   The landline number is no longer active.    His cell is [phone].

## 2021-12-27 NOTE — Telephone Encounter (Signed)
Wife Mavryk Pino calling in.    She called yesterday and talked with Anderson Malta and she told me to go to the [employer] site and do a virtual visit.   That didn't work out for [place].  We were on a cruise home Tues.   I got sick Mon.   He started feeling bad Tues.    I tested and I'm positive for Covid.   I feel fine.  He has a fever 101.2 last night, night before he coughed all night.  Last night (wife is a retired Marine scientist) he took 5 cc hydrocodone cough syrup which helped him sleep.  He is still coughing.   I don't know if he needs to wait it out or what.   He has a lot of post nasal drip.   He swallows it instead of spitting it out.   He is not eating.   He is drinking but not a lot.   No diarrhea or vomiting.       Reason for Disposition  [1] HIGH RISK for severe COVID complications (e.g., weak immune system, age > 17 years, obesity with BMI > 25, pregnant, chronic lung disease or other chronic medical condition) AND [2] COVID symptoms (e.g., cough, fever)  (Exceptions: Already seen by PCP and no new or worsening symptoms.)  Answer Assessment - Initial Assessment Questions 1. COVID-19 DIAGNOSIS: "Who made your COVID-19 diagnosis?" "Was it confirmed by a positive lab test or self-test?" If not diagnosed by a doctor (or NP/PA), ask "Are there lots of cases (community spread) where you live?" Note: See public health department website, if unsure.     Wife calling in.   Pt is positive for Covid.   Symptoms started Tues.  2. COVID-19 EXPOSURE: "Was there any known exposure to COVID before the symptoms began?" CDC Definition of close contact: within 6 feet (2 meters) for a total of 15 minutes or more over a 24-hour period.      On a cruise 3. ONSET: "When did the COVID-19 symptoms start?"      Tues 4. WORST SYMPTOM: "What is your worst symptom?" (e.g., cough, fever, shortness of breath, muscle aches)     *No Answer* 5. COUGH: "Do you have a cough?" If Yes, ask: "How bad is the cough?"       Yes  productive but he swallows it. Cough is worse symptom.   Wife did not hear rales or wheezing when she listened to him  6. FEVER: "Do you have a fever?" If Yes, ask: "What is your temperature, how was it measured, and when did it start?"     99.9 this morning.    7. RESPIRATORY STATUS: "Describe your breathing?" (e.g., shortness of breath, wheezing, unable to speak)      He is sleeping right now. 8. BETTER-SAME-WORSE: "Are you getting better, staying the same or getting worse compared to yesterday?"  If getting worse, ask, "In what way?"     *No Answer* 9. HIGH RISK DISEASE: "Do you have any chronic medical problems?" (e.g., asthma, heart or lung disease, weak immune system, obesity, etc.)     *No Answer* 10. VACCINE: "Have you had the COVID-19 vaccine?" If Yes, ask: "Which one, how many shots, when did you get it?"       *No Answer* 11. BOOSTER: "Have you received your COVID-19 booster?" If Yes, ask: "Which one and when did you get it?"       *No Answer* 12. PREGNANCY: "Is there  any chance you are pregnant?" "When was your last menstrual period?"       *No Answer* 13. OTHER SYMPTOMS: "Do you have any other symptoms?"  (e.g., chills, fatigue, headache, loss of smell or taste, muscle pain, sore throat)       See notes and above 14. O2 SATURATION MONITOR:  "Do you use an oxygen saturation monitor (pulse oximeter) at home?" If Yes, ask "What is your reading (oxygen level) today?" "What is your usual oxygen saturation reading?" (e.g., 95%)       Not asked  Protocols used: Coronavirus (COVID-19) Diagnosed or Suspected-A-AH

## 2022-01-13 ENCOUNTER — Encounter: Payer: Self-pay | Admitting: Physician Assistant

## 2022-01-13 ENCOUNTER — Other Ambulatory Visit: Payer: Self-pay | Admitting: Family Medicine

## 2022-01-13 NOTE — Progress Notes (Signed)
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  He initially underwent surgery for left femoral nail removal in 2018. During surgery he went into atrial fib with RVR which was new for him. 2-D echo showed normal LV function EF 55-60% with mildly dilated left atrium and mild LVH. He was started on Eliquis. He eventually underwent TEE/DCCV 12/2017 with conversion to NSR. TEE had shown moderate MR/TR as well. Since that time he has required additional DCCV in 12/2018 for recurrence. He has required medication adjustment since then. He was ultimately placed on flecainide in 08/2019 and underwent repeat DCCV 08/2019. He does not have prior ischemic assessment. I recommended ETT at [place] 12/2019 and the patient wished to defer. Prior carotid duplex 12/2017 showed 1-39% stenosis on the left, felt to be normal by Dr. [name]. At last visit 12/2020 with Dr. [name] the patient requested to go off flecainide since he had not had any recurrent issues with atrial fibrillation. Repeat echo 12/2020 EF 55-60%, mild LAE, mild MR/TR, aortic sclerosis without stenosis.  He is seen back for follow-up today doing well without any interim palpitations. No CP, SOB, dizziness, syncope. Overall doing good from cardiac standpoint. He inquires about coming off Eliquis due to desire to use nonsteroidals for his chronic arthritis. He has been losing weight over the past 6 months. He attributes this to eating less in the context of moving. Drinks 1  beer every so often.  Labwork independently reviewed: 10/2020 TSH wnl, K 3.8, Cr 0.86, Hgb 15.6, plt 270, trig 192, LDL 124 (followed by PCP)  Cardiology Studies:   Studies reviewed are outlined and summarized above. Reports included below if pertinent.   2D echo 12/2020   1. Left ventricular ejection fraction, by estimation, is 55 to 60%. The  left ventricle has normal function. The left ventricle has no regional  wall motion abnormalities. Left ventricular diastolic function could not  be evaluated.   2. Right ventricular systolic function is normal. The right ventricular  size is normal.   3. Left atrial size was mildly dilated.   4. The mitral valve is normal in structure. Mild mitral valve  regurgitation. No evidence of mitral stenosis.   5. The aortic valve is normal in structure. There is mild calcification  of the aortic valve. There is moderate thickening of the aortic valve.  Aortic valve regurgitation is not visualized. Mild to moderate aortic  valve sclerosis/calcification is  present, without any evidence of aortic stenosis.   6. The inferior vena cava is normal in size with greater than 50%  respiratory variability, suggesting right atrial pressure of 3 mmHg.   Comparison(s): No significant change from prior study.   TEE 12/2017 - Left ventricle: Systolic function was normal. The estimated    ejection fraction was in the range of 55% to 60%. Wall motion was    normal; there were no regional wall motion abnormalities.  - Aortic valve: There was mild regurgitation.  - Mitral valve: There was  moderate regurgitation.  - Left atrium: The atrium was dilated. No evidence of thrombus in    the atrial cavity or appendage. No evidence of thrombus in the    atrial cavity or appendage. No evidence of thrombus in the atrial    cavity or appendage.  - Right atrium: The atrium was dilated. No evidence of thrombus in    the atrial cavity or appendage.  - Atrial septum: No defect  or patent foramen ovale was identified.  - Tricuspid valve: There was moderate regurgitation.   Impressions:   - No cardiac source of emboli was indentified. The study was    followed by a successful cardioversion.    Carotid study 12/2017 Final Interpretation:     Right Carotid: There was no evidence of thrombus, dissection,  atherosclerotic                 plaque or stenosis in the cervical carotid system.   Left Carotid: Velocities in the left ICA are consistent with a 1-39%  stenosis.   Vertebrals:  Both vertebral arteries were patent with antegrade flow.  Subclavians: Normal flow hemodynamics were seen in bilateral subclavian               arteries.   *See table(s) above for measurements and observations.      Electronically signed by Carlyle Dolly on 01/18/2018 at 4:34:56 PM.        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   First degree AV block    GERD (gastroesophageal reflux disease)    GI symptom    had nausea / vomited once / frequent stools / getting better   Hyperlipidemia    Hypertension    Hypothyroidism    Mild carotid artery disease (HCC)    Mitral regurgitation    Mitral regurgitation    Mood changes    PAF (paroxysmal atrial fibrillation) (HCC)    Pneumonia    hx of   Tricuspid regurgitation    Tricuspid regurgitation     Past Surgical History:  Procedure Laterality Date   CARDIOVERSION N/A 01/12/2018   Procedure: CARDIOVERSION;  Surgeon: Dorothy Spark, MD;  Location: [facility];  Service: Cardiovascular;  Laterality: N/A;   CARDIOVERSION N/A 01/26/2019   Procedure: CARDIOVERSION;   Surgeon: Jerline Pain, MD;  Location[facility] END[facility];  Service: Cardiovascular;  Laterality: N/A;   CARDIOVERSION N/A 09/30/2019   Procedure: CARDIOVERSION;  Surgeon: Lelon Perla, MD;  Location: [facility] ENDOSCOPY;  Service: Cardiovascular;  Laterality: N/A;   COLONOSCOPY  2014   FEMUR IM NAIL Left 10/11/2016   Procedure: INTRAMEDULLARY (IM) NAIL FEMORAL;  Surgeon: [name], MD;  Location: [facility] ORS;  Service: Orthopedics;  Laterality: Left;   HAND SURGERY   1973 / 2010 / 2015   right to release tendons   HARDWARE REMOVAL Left 10/05/2017   Procedure: Removal of left femoral nail;  Surgeon: [name], MD;  Location: [facility] ORS;  Service: Orthopedics;  Laterality: Left;  90 mins   HEMATOMA EVACUATION Left 01/19/2018   Procedure: Irrigation and debridement, Evacuation of left total  knee hematoma;  Surgeon: [name], MD;  Location: [facility] ORS;  Service: Orthopedics;  Laterality: Left;   KNEE ARTHROSCOPY  1984 & 2001   twice   NASAL SEPTUM SURGERY     removal Left femoral nail      10/05/17 Dr. [name]   SHOULDER OPEN ROTATOR CUFF REPAIR  1999 & 2006   Right and left   TEE WITH CARDIOVERSION  01/12/2018   TEE WITHOUT CARDIOVERSION N/A 01/12/2018   Procedure: TRANSESOPHAGEAL ECHOCARDIOGRAM (TEE);  Surgeon: Dorothy Spark, MD;  Location: [facility] ENDOSCOPY;  Service: Cardiovascular;  Laterality: N/A;   TONSILLECTOMY     TOTAL KNEE ARTHROPLASTY Right 01/09/2015   Procedure: RIGHT TOTAL KNEE ARTHROPLASTY;  Surgeon: [name], MD;  Location: [facility] ORS;  Service: Orthopedics;  Laterality: Right;   TOTAL KNEE ARTHROPLASTY Left 11/23/2017   Procedure: LEFT TOTAL KNEE ARTHROPLASTY;  Surgeon: [name], MD;  Location: [facility] ORS;  Service: Orthopedics;  Laterality: Left;  90 mins    Current Medications: Current Meds  Medication Sig   acetaminophen (TYLENOL) 500 MG tablet Take 1,500 mg by mouth at bedtime as needed for mild pain or moderate pain.    amLODipine (NORVASC) 10 MG tablet Take 1 tablet (10 mg  total) by mouth daily.   amoxicillin (AMOXIL) 500 MG capsule Take 4 capsules by mouth as directed. Before dental appointment   apixaban (ELIQUIS) 5 MG TABS tablet Take 1 tablet (5 mg total) by mouth 2 (two) times daily. Due for follow-up with MD, Will need office visit for FUTURE refills.   atorvastatin (LIPITOR) 20 MG tablet Take 20 mg by mouth daily.   Cholecalciferol (VITAMIN D) 2000 UNITS CAPS Take 2,000 Units by mouth every evening.    ibuprofen (ADVIL) 200 MG tablet Take 800 mg by mouth daily as needed for moderate pain.   levothyroxine (SYNTHROID) 150 MCG tablet TAKE 1 TABLET EVERY DAY   magnesium oxide (MAG-OX) 400 (240 Mg) MG tablet TAKE 1 TABLET TWICE DAILY . MAKE APPOINTMENT WITH NEW CARDIOLOGIST [phone] BEFORE ANYMORE REFILLS.   Menthol, Topical Analgesic, (BLUE-EMU MAXIMUM STRENGTH EX) Apply 1 application topically at bedtime as needed (pain).   metoprolol tartrate (LOPRESSOR) 50 MG tablet Take 1 tablet (50 mg total) by mouth 2 (two) times daily.   Multiple Vitamin (MULTIVITAMIN WITH MINERALS) TABS tablet Take 1 tablet by mouth daily.    Multiple Vitamins-Minerals (PRESERVISION AREDS 2) CAPS Take 1 capsule by mouth 2 (two) times daily.    pantoprazole (PROTONIX) 40 MG tablet TAKE 1 TABLET EVERY DAY   polycarbophil (FIBERCON) 625 MG tablet Take 625 mg by mouth daily.    potassium chloride SA (KLOR-CON M20) 20 MEQ tablet Take 2 tablets (40 mEq total) by mouth daily.   valsartan-hydrochlorothiazide (DIOVAN-HCT) 320-25 MG tablet TAKE 1 TABLET EVERY DAY   venlafaxine XR (EFFEXOR-XR) 75 MG 24 hr capsule TAKE 1 CAPSULE EVERY DAY.  OFFICE VISIT NEEDED FOR MORE REFILLS      Allergies:   Sulfa antibiotics   Social History   Socioeconomic History   Marital status: Married    Spouse name: Not on file   Number of children: 2   Years of education: Not on file   Highest education level: Professional school degree (e.g., MD, DDS, DVM, JD)  Occupational History   Occupation: retired,  has degree in physiology  Tobacco Use   Smoking status: Former    Types: Cigarettes    Quit date: 12/29/1968    Years since quitting: 2.0  Smokeless tobacco: Never  Vaping Use   Vaping Use: Never used  Substance and Sexual Activity   Alcohol use: Yes    Alcohol/week: 12.0 standard drinks    Types: 12 Cans of beer per week    Comment: weekly   Drug use: No   Sexual activity: Yes  Other Topics Concern   Not on file  Social History Narrative   Pt lives in [place] w/ wife.   Social Determinants of Health   Financial Resource Strain: Not on file  Food Insecurity: Not on file  Transportation Needs: Not on file  Physical Activity: Not on file  Stress: Not on file  Social Connections: Not on file     Family History:  The patient's family history includes Breast cancer in his mother. There is no history of Colon cancer, Esophageal cancer, Stomach cancer, or Rectal cancer.  ROS:   Please see the history of present illness. No bleeding reported. All other systems are reviewed and otherwise negative.    EKG(s)/Additional Labs   EKG:  EKG is ordered today, personally reviewed, demonstrating NSR 64bpm, borderline first degree AVB 141ms, TWI III, QTc 463ms, QRS duration 64ms. No significant STT changes from prior.  Recent Labs: No results found for requested labs within last 8760 hours.  Recent Lipid Panel    Component Value Date/Time   CHOL 227 (H) 11/16/2020 1449   TRIG 192 (H) 11/16/2020 1449   HDL 70 11/16/2020 1449   CHOLHDL 2.9 08/04/2019 1359   CHOLHDL 3.8 10/11/2016 0337   VLDL UNABLE TO CALCULATE IF TRIGLYCERIDE OVER 400 mg/dL 10/11/2016 0337   LDLCALC 124 (H) 11/16/2020 1449    PHYSICAL EXAM:    VS:  BP 108/72    Pulse 64    Ht 5' 10.5" (1.791 m)    Wt 194 lb 9.6 oz (88.3 kg)    SpO2 94%    BMI 27.53 kg/m   BMI: Body mass index is 27.53 kg/m.  GEN: Well nourished, well developed male in no acute distress HEENT: normocephalic, atraumatic Neck: no JVD,  carotid bruits, or masses Cardiac: RRR; no murmurs, rubs, or gallops, no edema  Respiratory:  clear to auscultation bilaterally, normal work of breathing GI: soft, nontender, nondistended, + BS MS: no deformity or atrophy Skin: warm and dry, no rash Neuro:  Alert and Oriented x 3, Strength and sensation are intact, follows commands Psych: euthymic mood, full affect  Wt Readings from Last 3 Encounters:  01/15/22 194 lb 9.6 oz (88.3 kg)  12/31/20 224 lb 12.8 oz (102 kg)  11/13/20 229 lb (103.9 kg)     ASSESSMENT & PLAN:   1. Paroxysmal atrial fibrillation, known first degree AVB - clinically doing well without overt symptomatic recurrence. NSR today. Continue metoprolol and Eliquis. He is contemplating going off Eliquis to be able to use nonsteroidals. Given the paroxysmal nature of his atrial fibrillation I told him I think the Eliquis is medically necessary for stroke prevention. If he chose to go off this it would come with potential stroke risk if recurrent PAF went undetected. I discussed consideration of Watchman as an alternative option to be able to discontinue anticoagulation and he is not interested in this option. He generally prefers avoidance of procedures. We also discussed Kardia and Apple watch as ways to monitor for breakthrough. We'll have him return for repeat labs fasting - CBC, CMET, Mg, TSH. He declines Eliquis rx today citing that he still has this available at home. It may be worthwhile  for him to revisit alternative arthritis options with his PCP.  2. 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 Anticipate repeat echo 12/2023-2027, sooner if clinically dictated.  3. Essential HTN - controlled. He states it has been normal at home as well. Tolerating meds well. We discussed keeping an eye on this given his weight loss and notifying for any low readings or low BP symptoms - as patients lose weight they may not require as muc medication.  4. Mild carotid artery disease - discussed repeat  study but he prefers to defer at this time. Check CMET/lipids when he returns to clinic fasting.     Disposition: F/u with Dr. Johney Frame to establish care in 1 year, sooner if needed.   Medication Adjustments/Labs and Tests Ordered: Current medicines are reviewed at length with the patient today.  Concerns regarding medicines are outlined above. Medication changes, Labs and Tests ordered today are summarized above and listed in the Patient Instructions accessible in Encounters.   Signed, Charlie Pitter, PA-C  01/15/2022 2:33 PM    [place] Phone: [phone]; Fax: [phone]

## 2022-01-15 ENCOUNTER — Ambulatory Visit: Payer: Medicare HMO | Admitting: Physician Assistant

## 2022-01-15 ENCOUNTER — Encounter: Payer: Self-pay | Admitting: Physician Assistant

## 2022-01-15 ENCOUNTER — Other Ambulatory Visit: Payer: Self-pay

## 2022-01-15 VITALS — BP 108/72 | HR 64 | Ht 70.5 in | Wt 194.6 lb

## 2022-01-15 DIAGNOSIS — I10 Essential (primary) hypertension: Secondary | ICD-10-CM | POA: Diagnosis not present

## 2022-01-15 DIAGNOSIS — I071 Rheumatic tricuspid insufficiency: Secondary | ICD-10-CM | POA: Diagnosis not present

## 2022-01-15 DIAGNOSIS — I340 Nonrheumatic mitral (valve) insufficiency: Secondary | ICD-10-CM

## 2022-01-15 DIAGNOSIS — I480 Paroxysmal atrial fibrillation: Secondary | ICD-10-CM

## 2022-01-15 DIAGNOSIS — I779 Disorder of arteries and arterioles, unspecified: Secondary | ICD-10-CM

## 2022-01-15 NOTE — Patient Instructions (Addendum)
Medication Instructions:  Your physician recommends that you continue on your current medications as directed. Please refer to the Current Medication list given to you today.  *If you need a refill on your cardiac medications before your next appointment, please call your pharmacy*   Lab Work: [place] 7:15-5:00, Monday-Friday PLEASE COME FASTING.. NOTHING TO EAT OR DRINK AFTER MIDNIGHT THE NIGHT BEFORE:  CMET, MAG, LIPID, CBC, TSH   If you have labs (blood work) drawn today and your tests are completely normal, you will receive your results only by: [place] (if you have MyChart) OR A paper copy in the mail If you have any lab test that is abnormal or we need to change your treatment, we will call you to review the results.   Testing/Procedures: None ordered   Follow-Up: At [facility], you and your health needs are our priority.  As part of our continuing mission to provide you with exceptional heart care, we have created designated Provider Care Teams.  These Care Teams include your primary Cardiologist (physician) and Advanced Practice Providers (APPs -  Physician Assistants and Nurse Practitioners) who all work together to provide you with the care you need, when you need it.  We recommend signing up for the patient portal called "MyChart".  Sign up information is provided on this After Visit Summary.  MyChart is used to connect with patients for Virtual Visits (Telemedicine).  Patients are able to view lab/test results, encounter notes, upcoming appointments, etc.  Non-urgent messages can be sent to your provider as well.   To learn more about what you can do with MyChart, go to [email or web address].    Your next appointment:   12 month(s)  The format for your next appointment:   In Person  Provider:   Gwyndolyn Kaufman, MD   or Melina Copa, PA-C         Other Instructions

## 2022-01-23 ENCOUNTER — Other Ambulatory Visit: Payer: Medicare HMO

## 2022-01-23 ENCOUNTER — Other Ambulatory Visit: Payer: Self-pay

## 2022-01-23 DIAGNOSIS — I480 Paroxysmal atrial fibrillation: Secondary | ICD-10-CM | POA: Diagnosis not present

## 2022-01-23 DIAGNOSIS — I10 Essential (primary) hypertension: Secondary | ICD-10-CM

## 2022-01-23 DIAGNOSIS — I779 Disorder of arteries and arterioles, unspecified: Secondary | ICD-10-CM

## 2022-01-23 DIAGNOSIS — I071 Rheumatic tricuspid insufficiency: Secondary | ICD-10-CM

## 2022-01-23 DIAGNOSIS — I340 Nonrheumatic mitral (valve) insufficiency: Secondary | ICD-10-CM

## 2022-01-24 LAB — MAGNESIUM: Magnesium: 2 mg/dL (ref 1.6–2.3)

## 2022-01-24 LAB — COMPREHENSIVE METABOLIC PANEL
ALT: 25 IU/L (ref 0–44)
AST: 30 IU/L (ref 0–40)
Albumin/Globulin Ratio: 2 (ref 1.2–2.2)
Albumin: 4.5 g/dL (ref 3.7–4.7)
Alkaline Phosphatase: 83 IU/L (ref 44–121)
BUN/Creatinine Ratio: 13 (ref 10–24)
BUN: 12 mg/dL (ref 8–27)
Bilirubin Total: 0.6 mg/dL (ref 0.0–1.2)
CO2: 26 mmol/L (ref 20–29)
Calcium: 9.9 mg/dL (ref 8.6–10.2)
Chloride: 97 mmol/L (ref 96–106)
Creatinine, Ser: 0.9 mg/dL (ref 0.76–1.27)
Globulin, Total: 2.3 g/dL (ref 1.5–4.5)
Glucose: 115 mg/dL — ABNORMAL HIGH (ref 70–99)
Potassium: 4.2 mmol/L (ref 3.5–5.2)
Sodium: 137 mmol/L (ref 134–144)
Total Protein: 6.8 g/dL (ref 6.0–8.5)
eGFR: 89 mL/min/{1.73_m2} (ref >59)

## 2022-01-24 LAB — CBC
Hematocrit: 43.4 % (ref 37.5–51.0)
Hemoglobin: 14.9 g/dL (ref 13.0–17.7)
MCH: 34.2 pg — ABNORMAL HIGH (ref 26.6–33.0)
MCHC: 34.3 g/dL (ref 31.5–35.7)
MCV: 100 fL — ABNORMAL HIGH (ref 79–97)
Platelets: 248 10*3/uL (ref 150–450)
RBC: 4.36 x10E6/uL (ref 4.14–5.80)
RDW: 12.2 % (ref 11.6–15.4)
WBC: 5.6 10*3/uL (ref 3.4–10.8)

## 2022-01-24 LAB — LIPID PANEL
Chol/HDL Ratio: 2.3 ratio (ref 0.0–5.0)
Cholesterol, Total: 155 mg/dL (ref 100–199)
HDL: 66 mg/dL (ref >39)
LDL Chol Calc (NIH): 69 mg/dL (ref 0–99)
Triglycerides: 116 mg/dL (ref 0–149)
VLDL Cholesterol Cal: 20 mg/dL (ref 5–40)

## 2022-01-24 LAB — TSH: TSH: 0.392 u[IU]/mL — ABNORMAL LOW (ref 0.450–4.500)

## 2022-01-30 ENCOUNTER — Other Ambulatory Visit: Payer: Self-pay

## 2022-01-30 DIAGNOSIS — I071 Rheumatic tricuspid insufficiency: Secondary | ICD-10-CM

## 2022-01-30 DIAGNOSIS — I340 Nonrheumatic mitral (valve) insufficiency: Secondary | ICD-10-CM

## 2022-01-30 DIAGNOSIS — Z79899 Other long term (current) drug therapy: Secondary | ICD-10-CM

## 2022-01-30 MED ORDER — POTASSIUM CHLORIDE CRYS ER 20 MEQ PO TBCR: 40.0000 meq | EXTENDED_RELEASE_TABLET | Freq: Every day | ORAL | 2 refills | Status: DC

## 2022-02-07 ENCOUNTER — Other Ambulatory Visit: Payer: Self-pay

## 2022-02-07 ENCOUNTER — Telehealth: Payer: Self-pay

## 2022-02-07 NOTE — Telephone Encounter (Signed)
Converted to refill request

## 2022-02-07 NOTE — Telephone Encounter (Signed)
[facility] pharmacy faxed refill request for the following medications:  pantoprazole (PROTONIX) 40 MG tablet   levothyroxine (SYNTHROID) 150 MCG tablet   Please advise

## 2022-02-11 ENCOUNTER — Telehealth: Payer: Self-pay

## 2022-02-11 ENCOUNTER — Encounter: Payer: Self-pay | Admitting: *Deleted

## 2022-02-11 DIAGNOSIS — H52223 Regular astigmatism, bilateral: Secondary | ICD-10-CM | POA: Diagnosis not present

## 2022-02-11 DIAGNOSIS — H40003 Preglaucoma, unspecified, bilateral: Secondary | ICD-10-CM | POA: Diagnosis not present

## 2022-02-11 NOTE — Telephone Encounter (Signed)
Copied from [place] [id number]. Topic: Quick Communication - Rx Refill/Question >> Feb 11, 2022  2:40 PM [place], Brayton Layman A wrote: Pts wife was calling to follow up on medication refills and wanted to know why they have not been approved, pts wife also wanted to know which PCP the pt will be assigned too, please advise.

## 2022-02-14 ENCOUNTER — Ambulatory Visit (INDEPENDENT_AMBULATORY_CARE_PROVIDER_SITE_OTHER): Payer: Medicare HMO | Admitting: Physician Assistant

## 2022-02-14 ENCOUNTER — Other Ambulatory Visit: Payer: Self-pay

## 2022-02-14 ENCOUNTER — Encounter: Payer: Self-pay | Admitting: Physician Assistant

## 2022-02-14 VITALS — BP 109/70 | HR 57 | Ht 70.5 in | Wt 194.3 lb

## 2022-02-14 DIAGNOSIS — R739 Hyperglycemia, unspecified: Secondary | ICD-10-CM | POA: Diagnosis not present

## 2022-02-14 DIAGNOSIS — E039 Hypothyroidism, unspecified: Secondary | ICD-10-CM

## 2022-02-14 DIAGNOSIS — F325 Major depressive disorder, single episode, in full remission: Secondary | ICD-10-CM

## 2022-02-14 DIAGNOSIS — G4700 Insomnia, unspecified: Secondary | ICD-10-CM

## 2022-02-14 DIAGNOSIS — K219 Gastro-esophageal reflux disease without esophagitis: Secondary | ICD-10-CM | POA: Diagnosis not present

## 2022-02-14 MED ORDER — LEVOTHYROXINE SODIUM 150 MCG PO TABS: 150.0000 ug | ORAL_TABLET | Freq: Every day | ORAL | 1 refills | Status: DC

## 2022-02-14 MED ORDER — PANTOPRAZOLE SODIUM 40 MG PO TBEC: 40.0000 mg | DELAYED_RELEASE_TABLET | Freq: Every day | ORAL | 1 refills | Status: DC

## 2022-02-14 MED ORDER — VENLAFAXINE HCL ER 75 MG PO CP24: 75.0000 mg | ORAL_CAPSULE | Freq: Every day | ORAL | 1 refills | Status: DC

## 2022-02-14 NOTE — Assessment & Plan Note (Signed)
Recent low TSH, will recheck TSH and T4.  Pt requested refill of current dose in the time being.

## 2022-02-14 NOTE — Assessment & Plan Note (Signed)
Controlled on Effexor, pt feel well. Will refill

## 2022-02-14 NOTE — Progress Notes (Signed)
I,[name],acting as a Education administrator for [employer], PA-C.,have documented all relevant documentation on the behalf of [name], PA-C,as directed by  [name], PA-C while in the presence of [name], PA-C.  Established patient visit   Patient: Eugene White   DOB: 1946-03-10   76 y.o. Male  MRN: [id number] Visit Date: 02/14/2022  Today's healthcare provider: [name], PA-C   Cc. 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   He also reports new symptoms of insomnia over the last 6 months. Reports moving to a new home in May of 2022. He states its almost as if his day has shifted, unable to fall asleep until around 5-6 AM but then is able to sleep. Does not feel sleep deprived. He has tried OTC meds including tylenol PM and melatonin without success. Still takes tylenol PM on a nightly basis for the pain relief benefit it brings for his arthritis.  GERD -Symptoms of acid reflux are stable as long as he takes his medication .  Lipid/Cholesterol, Follow-up  Last lipid panel Other pertinent labs  Lab Results  Component Value Date   CHOL 155 01/23/2022   HDL 66 01/23/2022   LDLCALC 69 01/23/2022   TRIG 116 01/23/2022   CHOLHDL 2.3 01/23/2022   Lab Results  Component Value Date   ALT 25 01/23/2022   AST 30 01/23/2022   PLT 248 01/23/2022   TSH 0.392 (L) 01/23/2022     He was last seen for this over 1 years ago.  Management since that visit includes continue atorvastatin 20 mg.  He reports excellent compliance with treatment. He is not having side effects.   Symptoms: No chest pain No chest pressure/discomfort  No dyspnea No lower extremity edema  No numbness or tingling of extremity No orthopnea  No palpitations No paroxysmal nocturnal dyspnea  No speech  difficulty No syncope   Current diet: Denies eating snacks between meals, admits to diet heavier in fried and fatty foods.  Current exercise: none  The 10-year ASCVD risk score (Arnett DK, et al., 2019) is: 20.2%  ---------------------------------------------------------------------------------------------------  Depression, Follow-up  He  was last seen for this over 1 years ago. Changes made at last visit include continue current medication.   He reports excellent compliance with treatment. He is not having side effects.   He reports excellent tolerance of treatment. Current symptoms include:  none He feels he is Unchanged since last visit.  Depression screen [facility] 2/9 02/14/2022 10/11/2019 11/12/2018  Decreased Interest 0 0 0  Down, Depressed, Hopeless 0 0 0  PHQ - 2 Score 0 0 0  Altered sleeping 3 - -  Tired, decreased energy 0 - -  Change in appetite 0 - -  Feeling bad or failure about yourself  0 - -  Trouble concentrating 0 - -  Moving slowly or fidgety/restless 0 - -  Suicidal thoughts 0 - -  PHQ-9 Score 3 - -  Difficult doing work/chores Not difficult at all - -    -----------------------------------------------------------------------------------------  Hypothyroid, follow-up  Lab Results  Component Value Date   TSH 0.392 (L) 01/23/2022   TSH 1.640 11/16/2020   TSH 1.470 08/04/2019   T4TOTAL 4.9 10/11/2018   T4TOTAL 6.6 10/27/2016    Wt Readings  from Last 3 Encounters:  02/14/22 194 lb 4.8 oz (88.1 kg)  01/15/22 194 lb 9.6 oz (88.3 kg)  12/31/20 224 lb 12.8 oz (102 kg)    He was last seen for hypothyroid over 1 years ago. He recently had a TSH checked by his cardiologist, and it was low.  Management since that visit includes continue current medication. He reports excellent compliance with treatment. He is not having side effects.   Symptoms: No change in energy level No constipation  No diarrhea No heat / cold intolerance  No nervousness No  palpitations  Yes weight changes-- pt has lost weight but has changed his diet    -----------------------------------------------------------------------------------------   Medications: Outpatient Medications Prior to Visit  Medication Sig   acetaminophen (TYLENOL) 500 MG tablet Take 1,500 mg by mouth at bedtime as needed for mild pain or moderate pain.    amLODipine (NORVASC) 10 MG tablet Take 1 tablet (10 mg total) by mouth daily.   amoxicillin (AMOXIL) 500 MG capsule Take 4 capsules by mouth as directed. Before dental appointment   apixaban (ELIQUIS) 5 MG TABS tablet Take 1 tablet (5 mg total) by mouth 2 (two) times daily. Due for follow-up with MD, Will need office visit for FUTURE refills.   atorvastatin (LIPITOR) 20 MG tablet Take 20 mg by mouth daily.   Cholecalciferol (VITAMIN D) 2000 UNITS CAPS Take 2,000 Units by mouth every evening.    ibuprofen (ADVIL) 200 MG tablet Take 800 mg by mouth daily as needed for moderate pain.   magnesium oxide (MAG-OX) 400 (240 Mg) MG tablet TAKE 1 TABLET TWICE DAILY . MAKE APPOINTMENT WITH NEW CARDIOLOGIST [phone] BEFORE ANYMORE REFILLS.   Menthol, Topical Analgesic, (BLUE-EMU MAXIMUM STRENGTH EX) Apply 1 application topically at bedtime as needed (pain).   metoprolol tartrate (LOPRESSOR) 50 MG tablet Take 1 tablet (50 mg total) by mouth 2 (two) times daily.   Multiple Vitamin (MULTIVITAMIN WITH MINERALS) TABS tablet Take 1 tablet by mouth daily.    Multiple Vitamins-Minerals (PRESERVISION AREDS 2) CAPS Take 1 capsule by mouth 2 (two) times daily.    potassium chloride SA (KLOR-CON M20) 20 MEQ tablet Take 2 tablets (40 mEq total) by mouth daily.   valsartan-hydrochlorothiazide (DIOVAN-HCT) 320-25 MG tablet TAKE 1 TABLET EVERY DAY   [DISCONTINUED] levothyroxine (SYNTHROID) 150 MCG tablet TAKE 1 TABLET EVERY DAY   [DISCONTINUED] pantoprazole (PROTONIX) 40 MG tablet TAKE 1 TABLET EVERY DAY   [DISCONTINUED] polycarbophil (FIBERCON) 625 MG tablet  Take 625 mg by mouth daily.    [DISCONTINUED] venlafaxine XR (EFFEXOR-XR) 75 MG 24 hr capsule TAKE 1 CAPSULE EVERY DAY.  OFFICE VISIT NEEDED FOR MORE REFILLS   No facility-administered medications prior to visit.    Review of Systems  Constitutional:  Negative for fatigue and fever.  Respiratory:  Negative for cough and shortness of breath.   Cardiovascular:  Negative for chest pain, palpitations and leg swelling.  Neurological:  Negative for dizziness and headaches.     Objective    BP 109/70 (BP Location: Right Arm, Patient Position: Sitting, Cuff Size: Normal)    Pulse (!) 57    Ht 5' 10.5" (1.791 m)    Wt 194 lb 4.8 oz (88.1 kg)    SpO2 100%    BMI 27.49 kg/m   Physical Exam Constitutional:      General: He is awake.     Appearance: He is well-developed.  HENT:     Head: Normocephalic.  Eyes:     Conjunctiva/sclera: Conjunctivae  normal.  Cardiovascular:     Rate and Rhythm: Normal rate and regular rhythm.     Heart sounds: Normal heart sounds.  Pulmonary:     Effort: Pulmonary effort is normal.     Breath sounds: Normal breath sounds.  Musculoskeletal:     Right lower leg: No edema.     Left lower leg: No edema.  Skin:    General: Skin is warm.  Neurological:     Mental Status: He is alert and oriented to person, place, and time.  Psychiatric:        Attention and Perception: Attention normal.        Mood and Affect: Mood normal.        Speech: Speech normal.        Behavior: Behavior is cooperative.     No results found for any visits on 02/14/22.  Assessment & Plan     Problem List Items Addressed This Visit       Digestive   GERD (gastroesophageal reflux disease)    Controlled with PPI. Continue, refilled      Relevant Medications   pantoprazole (PROTONIX) 40 MG tablet     Endocrine   Hypothyroidism - Primary    Recent low TSH, will recheck TSH and T4.  Pt requested refill of current dose in the time being.        Relevant Medications    levothyroxine (SYNTHROID) 150 MCG tablet   Other Relevant Orders   TSH + free T4     Other   Major depressive disorder with single episode    Controlled on Effexor, pt feel well. Will refill      Relevant Medications   venlafaxine XR (EFFEXOR-XR) 75 MG 24 hr capsule   Insomnia    Discussed tactics on re-orienting sleep times. Pt prefers to stay off medications at this time, I agree. Discussed adding in more physical activity to his day.       Other Visit Diagnoses     Hyperglycemia       Relevant Orders   HgB A1c        Return ideally in 6 months for CPE, pt prefers yearly appointments.Hartley Barefoot, PA-C have reviewed all documentation for this visit. The documentation on  02/14/2022 for the exam, diagnosis, procedures, and orders are all accurate and complete.    [name], PA-C  [facility] [phone] (phone) [phone] (fax)  [place]

## 2022-02-14 NOTE — Assessment & Plan Note (Signed)
Controlled with PPI. Continue, refilled

## 2022-02-14 NOTE — Patient Instructions (Signed)
Recombinant Zoster (Shingles) Vaccine: What You Need to Know °1. Why get vaccinated? °Recombinant zoster (shingles) vaccine can prevent shingles. °Shingles (also called herpes zoster, or just zoster) is a painful skin rash, usually with blisters. In addition to the rash, shingles can cause fever, headache, chills, or upset stomach. Rarely, shingles can lead to complications such as pneumonia, hearing problems, blindness, brain inflammation (encephalitis), or death. °The risk of shingles increases with age. The most common complication of shingles is long-term nerve pain called postherpetic neuralgia (PHN). PHN occurs in the areas where the shingles rash was and can last for months or years after the rash goes away. The pain from PHN can be severe and debilitating. °The risk of PHN increases with age. An older adult with shingles is more likely to develop PHN and have longer lasting and more severe pain than a younger person. °People with weakened immune systems also have a higher risk of getting shingles and complications from the disease. °Shingles is caused by varicella-zoster virus, the same virus that causes chickenpox. After you have chickenpox, the virus stays in your body and can cause shingles later in life. Shingles cannot be passed from one person to another, but the virus that causes shingles can spread and cause chickenpox in someone who has never had chickenpox or has never received chickenpox vaccine. °2. Recombinant shingles vaccine °Recombinant shingles vaccine provides strong protection against shingles. By preventing shingles, recombinant shingles vaccine also protects against PHN and other complications. °Recombinant shingles vaccine is recommended for: °Adults 50 years and older °Adults 19 years and older who have a weakened immune system because of disease or treatments °Shingles vaccine is given as a two-dose series. For most people, the second dose should be given 2 to 6 months after the first  dose. Some people who have or will have a weakened immune system can get the second dose 1 to 2 months after the first dose. Ask your health care provider for guidance. °People who have had shingles in the past and people who have received varicella (chickenpox) vaccine are recommended to get recombinant shingles vaccine. The vaccine is also recommended for people who have already gotten another type of shingles vaccine, the live shingles vaccine. There is no live virus in recombinant shingles vaccine. °Shingles vaccine may be given at the same time as other vaccines. °3. Talk with your health care provider °Tell your vaccination provider if the person getting the vaccine: °Has had an allergic reaction after a previous dose of recombinant shingles vaccine, or has any severe, life-threatening allergies °Is currently experiencing an episode of shingles °Is pregnant °In some cases, your health care provider may decide to postpone shingles vaccination until a future visit. °People with minor illnesses, such as a cold, may be vaccinated. People who are moderately or severely ill should usually wait until they recover before getting recombinant shingles vaccine. °Your health care provider can give you more information. °4. Risks of a vaccine reaction °A sore arm with mild or moderate pain is very common after recombinant shingles vaccine. Redness and swelling can also happen at the site of the injection. °Tiredness, muscle pain, headache, shivering, fever, stomach pain, and nausea are common after recombinant shingles vaccine. °These side effects may temporarily prevent a vaccinated person from doing regular activities. Symptoms usually go away on their own in 2 to 3 days. You should still get the second dose of recombinant shingles vaccine even if you had one of these reactions after the first dose. °Guillain-Barré   syndrome (GBS), a serious nervous system disorder, has been reported very rarely after recombinant zoster  vaccine. °People sometimes faint after medical procedures, including vaccination. Tell your provider if you feel dizzy or have vision changes or ringing in the ears. °As with any medicine, there is a very remote chance of a vaccine causing a severe allergic reaction, other serious injury, or death. °5. What if there is a serious problem? °An allergic reaction could occur after the vaccinated person leaves the clinic. If you see signs of a severe allergic reaction (hives, swelling of the face and throat, difficulty breathing, a fast heartbeat, dizziness, or weakness), call 9-1-1 and get the person to the nearest hospital. °For other signs that concern you, call your health care provider. °Adverse reactions should be reported to the Vaccine Adverse Event Reporting System (VAERS). Your health care provider will usually file this report, or you can do it yourself. Visit the VAERS website at www.vaers.hhs.gov or call 1-800-822-7967. VAERS is only for reporting reactions, and VAERS staff members do not give medical advice. °6. How can I learn more? °Ask your health care provider. °Call your local or state health department. °Visit the website of the Food and Drug Administration (FDA) for vaccine package inserts and additional information at www.fda.gov/vaccinesblood-biologics/vaccines. °Contact the Centers for Disease Control and Prevention (CDC): °Call 1-800-232-4636 (1-800-CDC-INFO) or °Visit CDC's website at www.cdc.gov/vaccines. °Vaccine Information Statement Recombinant Zoster Vaccine (02/01/2021) °This information is not intended to replace advice given to you by your health care provider. Make sure you discuss any questions you have with your health care provider. °Document Revised: 08/30/2021 Document Reviewed: 02/15/2021 °Elsevier Patient Education © 2022 [employer] Inc. ° °

## 2022-02-14 NOTE — Assessment & Plan Note (Signed)
Discussed tactics on re-orienting sleep times. Pt prefers to stay off medications at this time, I agree. Discussed adding in more physical activity to his day.

## 2022-02-15 LAB — TSH+FREE T4
Free T4: 1.69 ng/dL (ref 0.82–1.77)
TSH: 0.311 u[IU]/mL — ABNORMAL LOW (ref 0.450–4.500)

## 2022-02-15 LAB — HEMOGLOBIN A1C
Est. average glucose Bld gHb Est-mCnc: 100 mg/dL
Hgb A1c MFr Bld: 5.1 % (ref 4.8–5.6)

## 2022-02-19 ENCOUNTER — Telehealth: Payer: Self-pay | Admitting: Physician Assistant

## 2022-02-19 NOTE — Telephone Encounter (Signed)
[facility] faxed refill request for the following medications:  valsartan-hydrochlorothiazide (DIOVAN-HCT) 320-25 MG tablet   Please advise.

## 2022-02-21 ENCOUNTER — Other Ambulatory Visit: Payer: Self-pay

## 2022-02-21 MED ORDER — VALSARTAN-HYDROCHLOROTHIAZIDE 320-25 MG PO TABS: 1.0000 | ORAL_TABLET | Freq: Every day | ORAL | 3 refills | Status: DC

## 2022-02-25 ENCOUNTER — Emergency Department: Payer: Medicare HMO

## 2022-02-25 ENCOUNTER — Other Ambulatory Visit: Payer: Self-pay

## 2022-02-25 ENCOUNTER — Emergency Department
Admission: EM | Admit: 2022-02-25 | Discharge: 2022-02-25 | Disposition: A | Discharge status: Home / Self Care | Payer: Medicare HMO | Attending: Emergency Medicine | Admitting: Emergency Medicine

## 2022-02-25 DIAGNOSIS — J9811 Atelectasis: Secondary | ICD-10-CM | POA: Diagnosis not present

## 2022-02-25 DIAGNOSIS — I959 Hypotension, unspecified: Secondary | ICD-10-CM | POA: Diagnosis not present

## 2022-02-25 DIAGNOSIS — E860 Dehydration: Secondary | ICD-10-CM | POA: Diagnosis not present

## 2022-02-25 DIAGNOSIS — T887XXA Unspecified adverse effect of drug or medicament, initial encounter: Secondary | ICD-10-CM | POA: Diagnosis not present

## 2022-02-25 DIAGNOSIS — R630 Anorexia: Secondary | ICD-10-CM | POA: Insufficient documentation

## 2022-02-25 DIAGNOSIS — R001 Bradycardia, unspecified: Secondary | ICD-10-CM | POA: Insufficient documentation

## 2022-02-25 DIAGNOSIS — R0602 Shortness of breath: Secondary | ICD-10-CM | POA: Insufficient documentation

## 2022-02-25 DIAGNOSIS — T50905A Adverse effect of unspecified drugs, medicaments and biological substances, initial encounter: Secondary | ICD-10-CM

## 2022-02-25 DIAGNOSIS — R531 Weakness: Secondary | ICD-10-CM | POA: Diagnosis present

## 2022-02-25 LAB — COMPREHENSIVE METABOLIC PANEL
ALT: 22 U/L (ref 0–44)
AST: 32 U/L (ref 15–41)
Albumin: 4.5 g/dL (ref 3.5–5.0)
Alkaline Phosphatase: 65 U/L (ref 38–126)
Anion gap: 9 (ref 5–15)
BUN: 13 mg/dL (ref 8–23)
CO2: 29 mmol/L (ref 22–32)
Calcium: 9.7 mg/dL (ref 8.9–10.3)
Chloride: 100 mmol/L (ref 98–111)
Creatinine, Ser: 0.96 mg/dL (ref 0.61–1.24)
GFR, Estimated: >60 mL/min (ref >60)
Glucose, Bld: 120 mg/dL — ABNORMAL HIGH (ref 70–99)
Potassium: 4.2 mmol/L (ref 3.5–5.1)
Sodium: 138 mmol/L (ref 135–145)
Total Bilirubin: 1 mg/dL (ref 0.3–1.2)
Total Protein: 6.9 g/dL (ref 6.5–8.1)

## 2022-02-25 LAB — TROPONIN I (HIGH SENSITIVITY)
Troponin I (High Sensitivity): 21 ng/L — ABNORMAL HIGH (ref <18)
Troponin I (High Sensitivity): 14 ng/L (ref <18)

## 2022-02-25 LAB — URINALYSIS, ROUTINE W REFLEX MICROSCOPIC
Bilirubin Urine: NEGATIVE
Glucose, UA: NEGATIVE mg/dL
Hgb urine dipstick: NEGATIVE
Ketones, ur: 5 mg/dL — AB
Leukocytes,Ua: NEGATIVE
Nitrite: NEGATIVE
Protein, ur: NEGATIVE mg/dL
Specific Gravity, Urine: 1.009 (ref 1.005–1.030)
pH: 8 (ref 5.0–8.0)

## 2022-02-25 LAB — CBC
HCT: 42.7 % (ref 39.0–52.0)
Hemoglobin: 14.3 g/dL (ref 13.0–17.0)
MCH: 33.6 pg (ref 26.0–34.0)
MCHC: 33.5 g/dL (ref 30.0–36.0)
MCV: 100.5 fL — ABNORMAL HIGH (ref 80.0–100.0)
Platelets: 271 10*3/uL (ref 150–400)
RBC: 4.25 MIL/uL (ref 4.22–5.81)
RDW: 13.9 % (ref 11.5–15.5)
WBC: 9.1 10*3/uL (ref 4.0–10.5)
nRBC: 0 % (ref 0.0–0.2)

## 2022-02-25 LAB — BRAIN NATRIURETIC PEPTIDE: B Natriuretic Peptide: 266.4 pg/mL — ABNORMAL HIGH (ref 0.0–100.0)

## 2022-02-25 MED ORDER — SODIUM CHLORIDE 0.9 % IV BOLUS
1000.0000 mL | Freq: Once | INTRAVENOUS | Status: AC
Start: 2022-02-25 — End: 2022-02-25
  Administered 2022-02-25: 1000 mL via INTRAVENOUS

## 2022-02-25 MED ORDER — ALBUTEROL SULFATE HFA 108 (90 BASE) MCG/ACT IN AERS
2.0000 | INHALATION_SPRAY | RESPIRATORY_TRACT | Status: DC | PRN
  Filled 2022-02-25: qty 6.7

## 2022-02-25 NOTE — Discharge Instructions (Signed)
For the next several days:  Take HALF your usual dose of metoprolol. You are currently prescribed metoprolol 50 mg tablets, take 1 tablet (50 mg) twice a day.  Start taking one HALF tablet instead of ONE tablet, starting tonight.   Check your blood pressure and record this regularly for the next several days  Drink plenty of fluids

## 2022-02-25 NOTE — ED Provider Notes (Signed)
[facility] Provider Note    Event Date/Time   First MD Initiated Contact with Patient 02/25/22 1503     (approximate)   History   Shortness of Breath   HPI  Eugene White is a 76 y.o. male with past medical history of atrial fibrillation, mild mitral regurgitation, hyperlipidemia, first-degree AV block, here with generalized weakness.  The patient states that over the last several months, he has progressively lost up to 40 pounds of weight.  He states this is because he has had a decreased appetite but it has not been a "precipitous" weight loss.  He states that after taking his medications this morning, he began to feel short of breath.  He felt lightheaded.  He felt like he could not catch his breath.  He felt like he was going to possibly pass out.  His wife checked his heart rate and blood pressure and he was hypotensive in the 10X to 90 systolic.  They had difficulty palpating his pulse.  This [facility] present for further evaluation.  He states he feels slightly better.  The only recent change in his health is that he states he urinated a significant amount overnight for the last night.  Denies any overt dysuria.  No history of BPH or UTIs.  No fevers or chills.  No recent medication changes.  No current chest pain.     Physical Exam   Triage Vital Signs: ED Triage Vitals  Enc Vitals Group     BP 02/25/22 1403 105/77     Pulse Rate 02/25/22 1403 (!) 36     Resp 02/25/22 1403 18     Temp 02/25/22 1403 98 F (36.7 C)     Temp src --      SpO2 02/25/22 1403 100 %     Weight --      Height --      Head Circumference --      Peak Flow --      Pain Score 02/25/22 1401 0     Pain Loc --      Pain Edu? --      Excl. in [place]? --     Most recent vital signs: Vitals:   02/25/22 1800 02/25/22 1830  BP: 129/78 123/87  Pulse: 66 81  Resp: 11 13  Temp:    SpO2: 97% 99%     General: Awake, no distress.  CV:  Good peripheral perfusion.   Bradycardic, very slight systolic murmur appreciated. Resp:  Normal effort.  Lungs clear to auscultation bilaterally. Abd:  No distention.  No tenderness.  No rebound or guarding. Other:  2+ radial pulses bilaterally.   ED Results / Procedures / Treatments   Labs (all labs ordered are listed, but only abnormal results are displayed) Labs Reviewed  CBC - Abnormal; Notable for the following components:      Result Value   MCV 100.5 (*)    All other components within normal limits  COMPREHENSIVE METABOLIC PANEL - Abnormal; Notable for the following components:   Glucose, Bld 120 (*)    All other components within normal limits  BRAIN NATRIURETIC PEPTIDE - Abnormal; Notable for the following components:   B Natriuretic Peptide 266.4 (*)    All other components within normal limits  URINALYSIS, ROUTINE W REFLEX MICROSCOPIC - Abnormal; Notable for the following components:   Color, Urine YELLOW (*)    APPearance CLEAR (*)    Ketones, ur 5 (*)  All other components within normal limits  TROPONIN I (HIGH SENSITIVITY) - Abnormal; Notable for the following components:   Troponin I (High Sensitivity) 21 (*)    All other components within normal limits  TROPONIN I (HIGH SENSITIVITY)     EKG atrial fibrillation with slow ventricular response, ventricular rate 59.  QRS 88, QTc 425.  No acute ST elevations or depressions.  No EKG evidence of acute ischemia or infarct.    RADIOLOGY Chest x-ray: No acute cardiopulmonary disease.   I also independently reviewed and agree wit radiologist interpretations.   PROCEDURES:  Critical Care performed: No  .1-3 Lead EKG Interpretation Performed by: Duffy Bruce, MD Authorized by: Duffy Bruce, MD     Interpretation: abnormal     ECG rate:  60-80   ECG rate assessment: bradycardic     Rhythm: sinus bradycardia     Ectopy: none     Conduction: normal   Comments:     Indication: Weakness   MEDICATIONS ORDERED IN  ED: Medications  sodium chloride 0.9 % bolus 1,000 mL (0 mLs Intravenous Stopped 02/25/22 1744)     IMPRESSION / MDM / ASSESSMENT AND PLAN / ED COURSE  I reviewed the triage vital signs and the nursing notes.                               The patient is on the cardiac monitor to evaluate for evidence of arrhythmia and/or significant heart rate changes.   MDM:  76 year old male with history of A-fib, hypertension, here with transient lightheadedness and shortness of breath.  This occurs after taking his morning medications.  Patient also reportedly has been urinating more frequently.  Clinically, my suspicion is likely overmedication, particularly with beta-blockade as the patient is bradycardic and mildly hypotensive.  He reportedly has lost significant weight recently, and has continued to take his usual dose of medications.  His blood pressure was in the 90s and heart rate in the 50s prior to arrival.  Patient given IV fluids and monitored with resolution of all symptoms.  He is adamant he had no chest pain and troponins are unremarkable, do not suspect ACS.  He has no clinical evidence to suggest PE, and he is satting well on room air.  Chest x-ray is clear.  Electrolytes are within normal limits.  Patient monitored on telemetry with gradual improvement in his heart rate.  This seems to correlate with resolution of his symptoms.  Feels reasonable to cut his metoprolol dose in half, encourage hydration, and have him follow-up as an outpatient.  Regarding his reported increase in urination, he has no signs of UTI.  No evidence of hypoglycemia.  Encouraged him to hydrate and follow-up with his physician.   MEDICATIONS GIVEN IN ED: Medications  sodium chloride 0.9 % bolus 1,000 mL (0 mLs Intravenous Stopped 02/25/22 1744)     Consults:  None   EMR reviewed  Cardiology notes, Melina Copa 01/15/2022      FINAL CLINICAL IMPRESSION(S) / ED DIAGNOSES   Final diagnoses:  Dehydration   Adverse effect of drug, initial encounter     Rx / DC Orders   ED Discharge Orders     None        Note:  This document was prepared using Dragon voice recognition software and may include unintentional dictation errors.   Duffy Bruce, MD 02/26/22 [phone]

## 2022-02-25 NOTE — ED Triage Notes (Signed)
Pt comes with c/o SOB, hypotensive and afib. Pt states this all started today. Pt states he just feels he can't catch his breath.  Pt denies any CP

## 2022-02-28 ENCOUNTER — Telehealth: Payer: Self-pay | Admitting: Cardiology

## 2022-02-28 NOTE — Telephone Encounter (Signed)
Completely agree with your plan! ?

## 2022-02-28 NOTE — Telephone Encounter (Signed)
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 ?Wife was calling in to make him an appointment to see Dr. Johney Frame for sometime in March, due to recent ER visit where the pt was dehydrated and was back in afib. ? ?Wife states he went to the ER for complaints of dizziness and low BP and was diagnosed with being dehydrated.  He was in afib. ?Wife states the ER MD decreased his metoprolol to 50 mg po bid and BPs have improved as well as symptoms.  ER MD advised them to reach out to Cards for an appointment to see Dr. Johney Frame sometime soon. ? ?Wife only wants the pt to see Dr. Johney Frame. ? ?Pts vitals are better with pressures running around 520 systolic and 80E diastolic.  Wife states rates are controlled in the 46s and he does go in and out of afib.  He is on eliquis.   ? ?Made the pt an appointment to see Dr. Johney Frame for 3/16 at 3:30 pm.  Wife is advised to keep him on his current regimen, continue monitoring vitals, and make sure he's staying plenty hydrated.  ?ED precautions provided to the pts wife in case symptoms persist or worsen.  Advised her if they choose to see an APP before his 3/16 appt with Dr. Johney Frame, they can call and we will arrange accordingly.  Wife still insist on the pt seeing the Doctor. ? ?Informed her I will route this message to Dr. Johney Frame to make her aware of this plan and for further advisement if any.  ?Wife verbalized understanding and agrees with this plan. ?

## 2022-02-28 NOTE — Telephone Encounter (Signed)
STAT if patient feels like he/she is going to faint  ? ?Are you dizzy now? Not sure, he's in bed ? ?Do you feel faint or have you passed out? no ? ?Do you have any other symptoms? Low BP, lost weight, having AFIB ? ?Have you checked your HR and BP (record if available)? 114/70 HR 70 pulse was irregular  ?

## 2022-03-04 ENCOUNTER — Other Ambulatory Visit: Payer: Self-pay

## 2022-03-04 DIAGNOSIS — Z79899 Other long term (current) drug therapy: Secondary | ICD-10-CM

## 2022-03-04 DIAGNOSIS — I071 Rheumatic tricuspid insufficiency: Secondary | ICD-10-CM

## 2022-03-04 DIAGNOSIS — I340 Nonrheumatic mitral (valve) insufficiency: Secondary | ICD-10-CM

## 2022-03-04 MED ORDER — MAGNESIUM OXIDE -MG SUPPLEMENT 400 (240 MG) MG PO TABS: ORAL_TABLET | ORAL | 3 refills | Status: DC

## 2022-03-06 NOTE — Progress Notes (Unsigned)
Cardiology Office Note:    Date:  03/06/2022   ID:  Eugene White, DOB [birth year], MRN [id number]  PCP:  [name], PA-C   [place] Providers Cardiologist:  Ena Dawley, MD { Click to update primary MD,subspecialty MD or APP then REFRESH:1}    Referring MD: [name], PA-C   No chief complaint on file. ***  History of Present Illness:    Eugene White is a 76 y.o. 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   First degree AV block    GERD (gastroesophageal reflux disease)    GI symptom    had nausea / vomited once / frequent stools / getting better   Hyperlipidemia    Hypertension    Hypothyroidism    Mild carotid artery disease (HCC)    Mitral regurgitation    Mitral regurgitation    Mood changes    PAF (paroxysmal atrial fibrillation) (HCC)    Pneumonia    hx of   Tricuspid regurgitation    Tricuspid regurgitation     Past Surgical History:  Procedure Laterality Date   CARDIOVERSION N/A 01/12/2018   Procedure: CARDIOVERSION;  Surgeon: Dorothy Spark, MD;  Location: [place];  Service: Cardiovascular;  Laterality: N/A;   CARDIOVERSION N/A 01/26/2019   Procedure: CARDIOVERSION;  Surgeon: Jerline Pain, MD;  Location: [facility] ENDOSCOPY;  Service: Cardiovascular;  Laterality: N/A;   CARDIOVERSION N/A 09/30/2019   Procedure: CARDIOVERSION;  Surgeon: Lelon Perla, MD;  Location: [facility] ENDOSCOPY;  Service: Cardiovascular;  Laterality: N/A;   COLONOSCOPY  2014   FEMUR IM NAIL Left 10/11/2016   Procedure: INTRAMEDULLARY (IM) NAIL FEMORAL;  Surgeon: [name], MD;   Location: [facility] ORS;  Service: Orthopedics;  Laterality: Left;   HAND SURGERY   1973 / 2010 / 2015   right to release tendons   HARDWARE REMOVAL Left 10/05/2017   Procedure: Removal of left femoral nail;  Surgeon: [name], MD;  Location: [facility];  Service: Orthopedics;  Laterality: Left;  90 mins   HEMATOMA EVACUATION Left 01/19/2018   Procedure: Irrigation and debridement, Evacuation of left total knee hematoma;  Surgeon: [name], MD;  Location: [facility];  Service: Orthopedics;  Laterality: Left;   KNEE ARTHROSCOPY  1984 & 2001   twice   NASAL SEPTUM SURGERY     removal Left femoral nail      10/05/17 Dr. [name]   SHOULDER OPEN ROTATOR CUFF REPAIR  1999 & 2006   Right and left   TEE WITH CARDIOVERSION  01/12/2018   TEE WITHOUT CARDIOVERSION N/A 01/12/2018   Procedure: TRANSESOPHAGEAL ECHOCARDIOGRAM (TEE);  Surgeon: Dorothy Spark, MD;  Location: [facility] ENDOSCOPY;  Service: Cardiovascular;  Laterality: N/A;   TONSILLECTOMY     TOTAL KNEE ARTHROPLASTY Right 01/09/2015   Procedure: RIGHT TOTAL KNEE ARTHROPLASTY;  Surgeon: [name], MD;  Location: [facility];  Service: Orthopedics;  Laterality: Right;   TOTAL KNEE ARTHROPLASTY Left 11/23/2017   Procedure: LEFT TOTAL KNEE ARTHROPLASTY;  Surgeon: [name], MD;  Location: [facility];  Service:  Orthopedics;  Laterality: Left;  90 mins    Current Medications: No outpatient medications have been marked as taking for the 03/13/22 encounter (Appointment) with Freada Bergeron, MD.     Allergies:   Sulfa antibiotics   Social History   Socioeconomic History   Marital status: Married    Spouse name: Not on file   Number of children: 2   Years of education: Not on file   Highest education level: Professional school degree (e.g., MD, DDS, DVM, JD)  Occupational History   Occupation: retired, has degree in physiology  Tobacco Use   Smoking status: Former    Types: Cigarettes    Quit date: 12/29/1968    Years since quitting: 53.2    Smokeless tobacco: Never  Vaping Use   Vaping Use: Never used  Substance and Sexual Activity   Alcohol use: Yes    Alcohol/week: 12.0 standard drinks    Types: 12 Cans of beer per week    Comment: weekly   Drug use: No   Sexual activity: Yes  Other Topics Concern   Not on file  Social History Narrative   Pt lives in [place] w/ wife.   Social Determinants of Health   Financial Resource Strain: Not on file  Food Insecurity: Not on file  Transportation Needs: Not on file  Physical Activity: Not on file  Stress: Not on file  Social Connections: Not on file     Family History: The patient's ***family history includes Breast cancer in his mother. There is no history of Colon cancer, Esophageal cancer, Stomach cancer, or Rectal cancer.  ROS:   Please see the history of present illness.    *** All other systems reviewed and are negative.  EKGs/Labs/Other Studies Reviewed:    The following studies were reviewed today: ***  EKG:  EKG is *** ordered today.  The ekg ordered today demonstrates ***  Recent Labs: 01/23/2022: Magnesium 2.0 02/14/2022: TSH 0.311 02/25/2022: ALT 22; B Natriuretic Peptide 266.4; BUN 13; Creatinine, Ser 0.96; Hemoglobin 14.3; Platelets 271; Potassium 4.2; Sodium 138  Recent Lipid Panel    Component Value Date/Time   CHOL 155 01/23/2022 1440   TRIG 116 01/23/2022 1440   HDL 66 01/23/2022 1440   CHOLHDL 2.3 01/23/2022 1440   CHOLHDL 3.8 10/11/2016 0337   VLDL UNABLE TO CALCULATE IF TRIGLYCERIDE OVER 400 mg/dL 10/11/2016 0337   LDLCALC 69 01/23/2022 1440     Risk Assessment/Calculations:   {Does this patient have ATRIAL FIBRILLATION?[phone]}       Physical Exam:    VS:  There were no vitals taken for this visit.    Wt Readings from Last 3 Encounters:  02/14/22 194 lb 4.8 oz (88.1 kg)  01/15/22 194 lb 9.6 oz (88.3 kg)  12/31/20 224 lb 12.8 oz (102 kg)     GEN: *** Well nourished, well developed in no acute distress HEENT:  Normal NECK: No JVD; No carotid bruits LYMPHATICS: No lymphadenopathy CARDIAC: ***RRR, no murmurs, rubs, gallops RESPIRATORY:  Clear to auscultation without rales, wheezing or rhonchi  ABDOMEN: Soft, non-tender, non-distended MUSCULOSKELETAL:  No edema; No deformity  SKIN: Warm and dry NEUROLOGIC:  Alert and oriented x 3 PSYCHIATRIC:  Normal affect   ASSESSMENT:    No diagnosis found. PLAN:    In order of problems listed above:  ***      {Are you ordering a CV Procedure (e.g. stress test, cath, DCCV, TEE, etc)?   Press F2        :  [id number]}    Medication Adjustments/Labs and Tests Ordered: Current medicines are reviewed at length with the patient today.  Concerns regarding medicines are outlined above.  No orders of the defined types were placed in this encounter.  No orders of the defined types were placed in this encounter.   There are no Patient Instructions on file for this visit.   Signed, Freada Bergeron, MD  03/06/2022 3:39 PM    [place]

## 2022-03-10 ENCOUNTER — Other Ambulatory Visit: Payer: Self-pay | Admitting: Family Medicine

## 2022-03-10 DIAGNOSIS — F325 Major depressive disorder, single episode, in full remission: Secondary | ICD-10-CM

## 2022-03-11 NOTE — Telephone Encounter (Signed)
Pharmacy received refill 02/14/22. ?Requested Prescriptions  ?Refused Prescriptions Disp Refills  ?? venlafaxine XR (EFFEXOR-XR) 75 MG 24 hr capsule [Pharmacy Med Name: VENLAFAXINE HYDROCHLORIDEER 75 MG Capsule Extended Release 24 Hour] 60 capsule   ?  Sig: TAKE 1 CAPSULE EVERY DAY (NEED MD APPOINTMENT FOR REFILLS)  ?  ? Psychiatry: Antidepressants - SNRI - desvenlafaxine & venlafaxine Failed - 03/10/2022 12:12 PM  ?  ?  Failed - Lipid Panel in normal range within the last 12 months  ?  Cholesterol, Total  ?Date Value Ref Range Status  ?01/23/2022 155 100 - 199 mg/dL Final  ? ?LDL Chol Calc (NIH)  ?Date Value Ref Range Status  ?01/23/2022 69 0 - 99 mg/dL Final  ? ?HDL  ?Date Value Ref Range Status  ?01/23/2022 66 >39 mg/dL Final  ? ?Triglycerides  ?Date Value Ref Range Status  ?01/23/2022 116 0 - 149 mg/dL Final  ? ?  ?  ?  Passed - Cr in normal range and within 360 days  ?  Creatinine, Ser  ?Date Value Ref Range Status  ?02/25/2022 0.96 0.61 - 1.24 mg/dL Final  ?   ?  ?  Passed - Completed PHQ-2 or PHQ-9 in the last 360 days  ?  ?  Passed - Last BP in normal range  ?  BP Readings from Last 1 Encounters:  ?02/25/22 123/87  ?   ?  ?  Passed - Valid encounter within last 6 months  ?  Recent Outpatient Visits   ?      ? 3 weeks ago Hypothyroidism, unspecified type  ? [facility] Thedore Mins, Ria Comment, PA-C  ? 1 year ago Primary hypertension  ? [place], PA-C  ? 2 years ago Chronic rhinitis  ? [place], PA-C  ? 3 years ago Atrial fibrillation status post cardioversion [facility])  ? [place], PA-C  ? 3 years ago Essential hypertension  ? [place], PA-C  ?  ?  ?Future Appointments   ?        ? In 2 days Freada Bergeron, MD [facility] Office, [facility]  ?  ? ?  ?  ?  ? ?

## 2022-03-13 ENCOUNTER — Ambulatory Visit: Payer: Medicare HMO | Admitting: Cardiology

## 2022-03-13 ENCOUNTER — Other Ambulatory Visit: Payer: Self-pay

## 2022-03-13 ENCOUNTER — Encounter: Payer: Self-pay | Admitting: Cardiology

## 2022-03-13 VITALS — BP 98/62 | HR 66 | Ht 70.5 in | Wt 194.0 lb

## 2022-03-13 DIAGNOSIS — I071 Rheumatic tricuspid insufficiency: Secondary | ICD-10-CM | POA: Diagnosis not present

## 2022-03-13 DIAGNOSIS — I779 Disorder of arteries and arterioles, unspecified: Secondary | ICD-10-CM

## 2022-03-13 DIAGNOSIS — I480 Paroxysmal atrial fibrillation: Secondary | ICD-10-CM | POA: Diagnosis not present

## 2022-03-13 DIAGNOSIS — I10 Essential (primary) hypertension: Secondary | ICD-10-CM

## 2022-03-13 DIAGNOSIS — I340 Nonrheumatic mitral (valve) insufficiency: Secondary | ICD-10-CM

## 2022-03-13 MED ORDER — METOPROLOL TARTRATE 25 MG PO TABS: 12.5000 mg | ORAL_TABLET | Freq: Two times a day (BID) | ORAL | 1 refills | Status: DC

## 2022-03-13 NOTE — Patient Instructions (Signed)
Medication Instructions:  ? ?STOP TAKING VALSARTAN-HCTZ NOW ? ? ?DECREASE YOUR METOPROLOL TARTRATE TO 12.5 MG BY MOUTH TWICE DAILY ? ?*If you need a refill on your cardiac medications before your next appointment, please call your pharmacy* ? ? ? ?Follow-Up: ? ?3 MONTHS IN THE OFFICE WITH DR. Johney Frame OR AN EXTENDER ? ?1}  ? ?Other Instructions ? ?PLEASE HAVE YOUR PCP DO AN EKG ON YOU NEXT WEEK AND CALL us TO REPORT IF YOU ARE IN SINUS RHYTHM OR AFIB--PLEASE HAVE THEM FAX EKG TO [phone] ATTN: DR. Johney Frame ? ?

## 2022-03-13 NOTE — Progress Notes (Signed)
?Cardiology Office Note:   ? ?Date:  03/13/2022  ? ?ID:  Eugene White, DOB July 05, 1946, MRN [id number] ? ?PCP:  [name], PA-C ?  ?[facility] Providers ?Cardiologist:  Ena Dawley, MD { ? ? ?Referring MD: [name], PA-C  ? ? ?History of Present Illness:   ? 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 ?  ?He initially underwent surgery for left femoral nail removal in 2018. During surgery he went into atrial fib with RVR which was new for him. 2-D echo showed normal LV function EF 55-60% with mildly dilated left atrium and mild LVH. He was started on Eliquis. He eventually underwent TEE/DCCV 12/2017 with conversion to NSR. TEE had shown moderate MR/TR as well. Since that time he has required additional DCCV in 12/2018 for recurrence. He has required medication adjustment since then. He was ultimately placed on flecainide in 08/2019 and underwent repeat DCCV 08/2019. He does not have prior ischemic assessment. I recommended ETT at [place] 12/2019 and the patient wished to defer. Prior carotid duplex 12/2017 showed 1-39% stenosis on the left, felt to be normal by Dr. [name]. At visit 12/2020 with Dr. [name] the patient requested to go off flecainide since he had not had any recurrent issues with atrial fibrillation. Repeat echo 12/2020 EF 55-60%, mild LAE, mild MR/TR, aortic sclerosis without stenosis. ? ?Was last seen by Melina Copa on 01/15/22 where he was doing well.  ?  ?Today, the patient states that he is not feeling well. Specifically, he has been feeling more short of breath and has less energy. Notably, was seen in the ER 01/2022 for generalized weakness thought to be due to hypotension and dehydration. Trop negative and ECG nonischemic at that time. Was given  IVF with improvement but continued on all his antihypertensive medications. ? ?Notably, over the past 8 months he has lost about 30 lbs. He attributes this to a decrease in appetite. He does not snack, and he avoids soda and tea. Normally drinks 1 cup of black coffee in the morning 3 times a week. He has continued to take his antihypertensives and has noted his blood pressures have been very low. ? ?He is notably also in Afib today. Denies chest pain, palpitations, LE edema, orthopnea or PND. He is on metop '25mg'$  BID. HR in 50-60s.  ? ?Also, he is suffering from fatigue and insomnia. On a given night he may not fall asleep until 6 AM. ? ?He denies any chest pain, or peripheral edema. No lightheadedness, headaches, syncope, orthopnea, or PND. ? ? ?Past Medical History:  ?Diagnosis Date  ? Adult hypothyroidism 05/31/2009  ? Anemia   ? hx of one time  ? Arthritis   ? OA  ? Closed intertrochanteric fracture of hip, left, initial encounter ([place]) 10/10/2016  ? Colon, diverticulosis 05/31/2009  ? Difficulty sleeping   ? Diverticulitis large intestine 05/31/2009  ? Diverticulosis 2014  ? 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 First degree AV block   ? GERD (gastroesophageal reflux disease)   ? GI symptom   ? had nausea / vomited once / frequent stools / getting better  ? Hyperlipidemia   ? Hypertension   ? Hypothyroidism   ? Mild carotid  artery disease ([place])   ? Mitral regurgitation   ? Mitral regurgitation   ? Mood changes   ? PAF (paroxysmal atrial fibrillation) ([place])   ? Pneumonia   ? hx of  ? Tricuspid regurgitation   ? Tricuspid regurgitation   ? ? ?Past Surgical History:  ?Procedure Laterality Date  ? CARDIOVERSION N/A 01/12/2018  ? Procedure: CARDIOVERSION;  Surgeon: Dorothy Spark, MD;  Location: [place];  Service: Cardiovascular;  Laterality: N/A;  ? CARDIOVERSION N/A 01/26/2019  ? Procedure: CARDIOVERSION;  Surgeon: Jerline Pain, MD;  Location: [facility] ENDOSCOPY;  Service:  Cardiovascular;  Laterality: N/A;  ? CARDIOVERSION N/A 09/30/2019  ? Procedure: CARDIOVERSION;  Surgeon: Lelon Perla, MD;  Location: [facility] ENDOSCOPY;  Service: Cardiovascular;  Laterality: N/A;  ? COLONOSCOPY  2014  ? FEMUR IM NAIL Left 10/11/2016  ? Procedure: INTRAMEDULLARY (IM) NAIL FEMORAL;  Surgeon: [name], MD;  Location: [facility];  Service: Orthopedics;  Laterality: Left;  ? HAND SURGERY   1973 / 2010 / 2015  ? right to release tendons  ? HARDWARE REMOVAL Left 10/05/2017  ? Procedure: Removal of left femoral nail;  Surgeon: [name], MD;  Location: [facility];  Service: Orthopedics;  Laterality: Left;  90 mins  ? HEMATOMA EVACUATION Left 01/19/2018  ? Procedure: Irrigation and debridement, Evacuation of left total knee hematoma;  Surgeon: [name], MD;  [facility]: [facility];  Service: Orthopedics;  Laterality: Left;  ? KNEE ARTHROSCOPY  1984 & 2001  ? twice  ? NASAL SEPTUM SURGERY    ? removal Left femoral nail     ? 10/05/17 Dr. [name]  ? [place] & 2006  ? Right and left  ? TEE WITH CARDIOVERSION  01/12/2018  ? TEE WITHOUT CARDIOVERSION N/A 01/12/2018  ? Procedure: TRANSESOPHAGEAL ECHOCARDIOGRAM (TEE);  Surgeon: Dorothy Spark, MD;  Location: [place];  Service: Cardiovascular;  Laterality: N/A;  ? TONSILLECTOMY    ? TOTAL KNEE ARTHROPLASTY Right 01/09/2015  ? Procedure: RIGHT TOTAL KNEE ARTHROPLASTY;  Surgeon: [name], MD;  [facility];  Service: Orthopedics;  Laterality: Right;  ? TOTAL KNEE ARTHROPLASTY Left 11/23/2017  ? Procedure: LEFT TOTAL KNEE ARTHROPLASTY;  Surgeon: [name], MD;  [facility]: [facility];  Service: Orthopedics;  Laterality: Left;  90 mins  ? ? ?Current Medications: ?Current Meds  ?Medication Sig  ? acetaminophen (TYLENOL) 500 MG tablet Take 1,500 mg by mouth at bedtime as needed for mild pain or moderate pain.   ? amLODipine (NORVASC) 10 MG tablet Take 1 tablet (10 mg total) by mouth daily.  ? amoxicillin (AMOXIL) 500 MG capsule  Take 4 capsules by mouth as directed. Before dental appointment  ? apixaban (ELIQUIS) 5 MG TABS tablet Take 1 tablet (5 mg total) by mouth 2 (two) times daily. Due for follow-up with MD, Will need office visit for FUTURE refills.  ? atorvastatin (LIPITOR) 20 MG tablet Take 20 mg by mouth daily.  ? Cholecalciferol (VITAMIN D) 2000 UNITS CAPS Take 2,000 Units by mouth every evening.   ? ibuprofen (ADVIL) 200 MG tablet Take 800 mg by mouth daily as needed for moderate pain.  ? levothyroxine (SYNTHROID) 150 MCG tablet Take 1 tablet (150 mcg total) by mouth daily.  ? magnesium oxide (MAG-OX) 400 (240 Mg) MG tablet TAKE 1 TABLET TWICE DAILY .  ? Menthol, Topical Analgesic, (BLUE-EMU MAXIMUM STRENGTH EX) Apply 1 application topically at bedtime as needed (pain).  ? metoprolol tartrate (LOPRESSOR)  25 MG tablet Take 0.5 tablets (12.5 mg total) by mouth 2 (two) times daily.  ? Multiple Vitamin (MULTIVITAMIN WITH MINERALS) TABS tablet Take 1 tablet by mouth daily.   ? Multiple Vitamins-Minerals (PRESERVISION AREDS 2) CAPS Take 1 capsule by mouth 2 (two) times daily.   ? pantoprazole (PROTONIX) 40 MG tablet Take 1 tablet (40 mg total) by mouth daily.  ? potassium chloride SA (KLOR-CON M20) 20 MEQ tablet Take 2 tablets (40 mEq total) by mouth daily.  ? venlafaxine XR (EFFEXOR-XR) 75 MG 24 hr capsule Take 1 capsule (75 mg total) by mouth daily.  ? [DISCONTINUED] metoprolol succinate (TOPROL-XL) 50 MG 24 hr tablet Take 50 mg by mouth daily. Pt. Take 1/2 tablet 25 mg twice a day.  ? [DISCONTINUED] valsartan-hydrochlorothiazide (DIOVAN-HCT) 320-25 MG tablet Take 1 tablet by mouth daily.  ?  ? ?Allergies:   Other and Sulfa antibiotics  ? ?Social History  ? ?Socioeconomic History  ? Marital status: Married  ?  Spouse name: Not on file  ? Number of children: 2  ? Years of education: Not on file  ? Highest education level: Professional school degree (e.g., MD, DDS, DVM, JD)  ?Occupational History  ? Occupation: retired, has degree in  physiology  ?Tobacco Use  ? Smoking status: Former  ?  Types: Cigarettes  ?  Quit date: 12/29/1968  ?  Years since quitting: 53.2  ? Smokeless tobacco: Never  ?Vaping Use  ? Vaping Use: Never used  ?Substance and Sex

## 2022-03-19 ENCOUNTER — Other Ambulatory Visit: Payer: Self-pay | Admitting: Family Medicine

## 2022-03-19 DIAGNOSIS — F325 Major depressive disorder, single episode, in full remission: Secondary | ICD-10-CM

## 2022-03-19 NOTE — Progress Notes (Signed)
? ?I,Eugene White,acting as a Education administrator for [employer], PA-C.,have documented all relevant documentation on the behalf of Eugene Kirschner, PA-C,as directed by  Eugene Kirschner, PA-C while in the presence of Eugene Kirschner, PA-C. ? ?Established Patient Office Visit ? ?Subjective:  ?Patient ID: Eugene White, male    DOB: 04/30/1946  Age: 76 y.o. MRN: [id number] ? ?CC: EKG request from cardio ? ? ?HPI ?Eugene White presents for EKG requested from his cardiologist-- to see if he is still in Afib after rehydrating and medication changes. ?Dailen states he still feels like he is in afib--very fatigued, gets fatigued/dizzy with small activities.  ? ?Past Medical History:  ?Diagnosis Date  ? Adult hypothyroidism 05/31/2009  ? Anemia   ? hx of one time  ? Arthritis   ? OA  ? Closed intertrochanteric fracture of hip, left, initial encounter ([place]) 10/10/2016  ? Colon, diverticulosis 05/31/2009  ? Difficulty sleeping   ? Diverticulitis large intestine 05/31/2009  ? Diverticulosis 2014  ? 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 First degree AV block   ? GERD (gastroesophageal reflux disease)   ? GI symptom   ? had nausea / vomited once / frequent stools / getting better  ? Hyperlipidemia   ? Hypertension   ? Hypothyroidism   ? Mild carotid artery disease ([place])   ? Mitral regurgitation   ? Mitral regurgitation   ? Mood changes   ? PAF (paroxysmal atrial fibrillation) ([place])   ? Pneumonia   ? hx of  ? Tricuspid regurgitation   ? Tricuspid regurgitation   ? ? ?Past Surgical History:  ?Procedure Laterality Date  ? CARDIOVERSION N/A 01/12/2018  ? Procedure: CARDIOVERSION;  Surgeon: Eugene Spark, MD;  Location: [place];  Service: Cardiovascular;  Laterality: N/A;  ? CARDIOVERSION N/A 01/26/2019  ? Procedure: CARDIOVERSION;  Surgeon: Eugene Pain, MD;  Location: [facility] ENDOSCOPY;  Service: Cardiovascular;  Laterality: N/A;  ? CARDIOVERSION N/A 09/30/2019  ? Procedure: CARDIOVERSION;  Surgeon:  Eugene Perla, MD;  Location: [facility] ENDOSCOPY;  Service: Cardiovascular;  Laterality: N/A;  ? COLONOSCOPY  2014  ? FEMUR IM NAIL Left 10/11/2016  ? Procedure: INTRAMEDULLARY (IM) NAIL FEMORAL;  Surgeon: [name], MD;  Location: [facility] ORS;  Service: Orthopedics;  Laterality: Left;  ? HAND SURGERY   1973 / 2010 / 2015  ? right to release tendons  ? HARDWARE REMOVAL Left 10/05/2017  ? Procedure: Removal of left femoral nail;  Surgeon: [name], MD;  Location: [facility];  Service: Orthopedics;  Laterality: Left;  90 mins  ? HEMATOMA EVACUATION Left 01/19/2018  ? Procedure: Irrigation and debridement, Evacuation of left total knee hematoma;  Surgeon: [name], MD;  Location: [facility];  Service: Orthopedics;  Laterality: Left;  ? KNEE ARTHROSCOPY  1984 & 2001  ? twice  ? NASAL SEPTUM SURGERY    ? removal Left femoral nail     ? 10/05/17 Dr. [name]  ? [place] & 2006  ? Right and left  ? TEE WITH CARDIOVERSION  01/12/2018  ? TEE WITHOUT CARDIOVERSION N/A 01/12/2018  ? Procedure: TRANSESOPHAGEAL ECHOCARDIOGRAM (TEE);  Surgeon: Eugene Spark, MD;  Location: [place];  Service: Cardiovascular;  Laterality: N/A;  ? TONSILLECTOMY    ? TOTAL KNEE ARTHROPLASTY Right 01/09/2015  ? Procedure: RIGHT TOTAL KNEE ARTHROPLASTY;  Surgeon: [name], MD;  [facility];  Service: Orthopedics;  Laterality: Right;  ?  TOTAL KNEE ARTHROPLASTY Left 11/23/2017  ? Procedure: LEFT TOTAL KNEE ARTHROPLASTY;  Surgeon: [name], MD;  [facility];  Service: Orthopedics;  Laterality: Left;  90 mins  ? ? ?Family History  ?Problem Relation Age of Onset  ? Breast cancer Mother   ? Colon cancer Neg Hx   ? Esophageal cancer Neg Hx   ? Stomach cancer Neg Hx   ? Rectal cancer Neg Hx   ? ? ?Social History  ? ?Socioeconomic History  ? Marital status: Married  ?  Spouse name: Not on file  ? Number of children: 2  ? Years of education: Not on file  ? Highest education level: Professional school degree  (e.g., MD, DDS, DVM, JD)  ?Occupational History  ? Occupation: retired, has degree in physiology  ?Tobacco Use  ? Smoking status: Former  ?  Types: Cigarettes  ?  Quit date: 12/29/1968  ?  Years since quitting: 53.2  ? Smokeless tobacco: Never  ?Vaping Use  ? Vaping Use: Never used  ?Substance and Sexual Activity  ? Alcohol use: Yes  ?  Alcohol/week: 12.0 standard drinks  ?  Types: 12 Cans of beer per week  ?  Comment: weekly  ? Drug use: No  ? Sexual activity: Yes  ?Other Topics Concern  ? Not on file  ?Social History Narrative  ? Pt lives in [place] w/ wife.  ? ?Social Determinants of Health  ? ?Financial Resource Strain: Not on file  ?Food Insecurity: Not on file  ?Transportation Needs: Not on file  ?Physical Activity: Not on file  ?Stress: Not on file  ?Social Connections: Not on file  ?Intimate Partner Violence: Not on file  ? ? ?Outpatient Medications Prior to Visit  ?Medication Sig Dispense Refill  ? acetaminophen (TYLENOL) 500 MG tablet Take 1,500 mg by mouth at bedtime as needed for mild White or moderate White.     ? amLODipine (NORVASC) 10 MG tablet Take 1 tablet (10 mg total) by mouth daily. 90 tablet 2  ? amoxicillin (AMOXIL) 500 MG capsule Take 4 capsules by mouth as directed. Before dental appointment    ? apixaban (ELIQUIS) 5 MG TABS tablet Take 1 tablet (5 mg total) by mouth 2 (two) times daily. Due for follow-up with MD, Will need office visit for FUTURE refills. 180 tablet 1  ? atorvastatin (LIPITOR) 20 MG tablet Take 20 mg by mouth daily.    ? Cholecalciferol (VITAMIN D) 2000 UNITS CAPS Take 2,000 Units by mouth every evening.     ? ibuprofen (ADVIL) 200 MG tablet Take 800 mg by mouth daily as needed for moderate White.    ? levothyroxine (SYNTHROID) 150 MCG tablet Take 1 tablet (150 mcg total) by mouth daily. 90 tablet 1  ? magnesium oxide (MAG-OX) 400 (240 Mg) MG tablet TAKE 1 TABLET TWICE DAILY . 180 tablet 3  ? Menthol, Topical Analgesic, (BLUE-EMU MAXIMUM STRENGTH EX) Apply 1 application  topically at bedtime as needed (White).    ? metoprolol tartrate (LOPRESSOR) 25 MG tablet Take 0.5 tablets (12.5 mg total) by mouth 2 (two) times daily. 90 tablet 1  ? Multiple Vitamin (MULTIVITAMIN WITH MINERALS) TABS tablet Take 1 tablet by mouth daily.     ? Multiple Vitamins-Minerals (PRESERVISION AREDS 2) CAPS Take 1 capsule by mouth 2 (two) times daily.     ? pantoprazole (PROTONIX) 40 MG tablet Take 1 tablet (40 mg total) by mouth daily. 90 tablet 1  ? potassium chloride SA (KLOR-CON M20) 20  MEQ tablet Take 2 tablets (40 mEq total) by mouth daily. 180 tablet 2  ? venlafaxine XR (EFFEXOR-XR) 75 MG 24 hr capsule Take 1 capsule (75 mg total) by mouth daily. 90 capsule 1  ? ?No facility-administered medications prior to visit.  ? ? ?Allergies  ?Allergen Reactions  ? Other   ?  Other reaction(s): Other (See Comments) ?unknown ?Unknown/childhood allergy  ? Sulfa Antibiotics Other (See Comments)  ?  Unknown/childhood allergy ?unknown  ? ? ?ROS ?Review of Systems  ?Constitutional:  Positive for fatigue. Negative for fever.  ?Respiratory:  Positive for shortness of breath. Negative for cough.   ?Cardiovascular:  Negative for chest White, palpitations and leg swelling.  ?Neurological:  Negative for dizziness and headaches.  ? ?  ?Objective:  ?  ?Physical Exam ?Constitutional:   ?   Appearance: Normal appearance. He is not ill-appearing.  ?HENT:  ?   Head: Normocephalic.  ?Eyes:  ?   Conjunctiva/sclera: Conjunctivae normal.  ?Cardiovascular:  ?   Rate and Rhythm: Normal rate. Rhythm irregular.  ?Pulmonary:  ?   Effort: Pulmonary effort is normal.  ?   Breath sounds: Normal breath sounds.  ?Neurological:  ?   Mental Status: He is oriented to person, place, and time.  ?Psychiatric:     ?   Mood and Affect: Mood normal.     ?   Behavior: Behavior normal.  ? ? ?There were no vitals taken for this visit. ?Wt Readings from Last 3 Encounters:  ?03/13/22 194 lb (88 kg)  ?02/14/22 194 lb 4.8 oz (88.1 kg)  ?01/15/22 194 lb 9.6 oz  (88.3 kg)  ? ? ? ?Health Maintenance Due  ?Topic Date Due  ? Hepatitis C Screening  Never done  ? Zoster Vaccines- Shingrix (1 of 2) Never done  ? COVID-19 Vaccine (4 - Booster for Pfizer series) 12/13/2020  ?

## 2022-03-20 ENCOUNTER — Ambulatory Visit (INDEPENDENT_AMBULATORY_CARE_PROVIDER_SITE_OTHER): Payer: Medicare HMO | Admitting: Physician Assistant

## 2022-03-20 ENCOUNTER — Other Ambulatory Visit: Payer: Self-pay

## 2022-03-20 ENCOUNTER — Encounter: Payer: Self-pay | Admitting: Physician Assistant

## 2022-03-20 VITALS — BP 119/88 | HR 96 | Temp 98.4°F | Resp 18 | Ht 70.0 in | Wt 194.0 lb

## 2022-03-20 DIAGNOSIS — I4891 Unspecified atrial fibrillation: Secondary | ICD-10-CM

## 2022-03-20 NOTE — Assessment & Plan Note (Signed)
EKG today, Afib w/ HR of 70 bpm. Pulse is irregular ?Will fax ekg to cardiologist and pt states he will call. Prefers repeat cardioversion.  ? ?

## 2022-03-21 NOTE — Telephone Encounter (Signed)
Patient called and advised Venlafaxine was sent to [facility] in February and [facility] has sent a request. He says that he uses [facility] for his chronic medications and to send it there for future refill.  ?

## 2022-03-21 NOTE — Telephone Encounter (Signed)
Requested Prescriptions  ?Pending Prescriptions Disp Refills  ?? venlafaxine XR (EFFEXOR-XR) 75 MG 24 hr capsule [Pharmacy Med Name: VENLAFAXINE HYDROCHLORIDEER 75 MG Capsule Extended Release 24 Hour] 90 capsule 0  ?  Sig: TAKE 1 CAPSULE EVERY DAY (NEED MD APPOINTMENT FOR REFILLS)  ?  ? Psychiatry: Antidepressants - SNRI - desvenlafaxine & venlafaxine Failed - 03/19/2022  6:55 PM  ?  ?  Failed - Lipid Panel in normal range within the last 12 months  ?  Cholesterol, Total  ?Date Value Ref Range Status  ?01/23/2022 155 100 - 199 mg/dL Final  ? ?LDL Chol Calc (NIH)  ?Date Value Ref Range Status  ?01/23/2022 69 0 - 99 mg/dL Final  ? ?HDL  ?Date Value Ref Range Status  ?01/23/2022 66 >39 mg/dL Final  ? ?Triglycerides  ?Date Value Ref Range Status  ?01/23/2022 116 0 - 149 mg/dL Final  ? ?  ?  ?  Passed - Cr in normal range and within 360 days  ?  Creatinine, Ser  ?Date Value Ref Range Status  ?02/25/2022 0.96 0.61 - 1.24 mg/dL Final  ?   ?  ?  Passed - Completed PHQ-2 or PHQ-9 in the last 360 days  ?  ?  Passed - Last BP in normal range  ?  BP Readings from Last 1 Encounters:  ?03/20/22 119/88  ?   ?  ?  Passed - Valid encounter within last 6 months  ?  Recent Outpatient Visits   ?      ? Yesterday Atrial fibrillation with controlled ventricular rate ([place])  ? [facility] Thedore Mins, [place], PA-C  ? 1 month ago Hypothyroidism, unspecified type  ? [facility] Thedore Mins, Ria Comment, PA-C  ? 1 year ago Primary hypertension  ? [place], PA-C  ? 2 years ago Chronic rhinitis  ? [place], PA-C  ? 3 years ago Atrial fibrillation status post cardioversion [facility])  ? [place], PA-C  ?  ?  ?Future Appointments   ?        ? In 3 months Swinyer, Lanice Schwab, NP [facility], [facility]  ?  ? ?  ?  ?  ? ? ?

## 2022-03-24 ENCOUNTER — Telehealth: Payer: Self-pay | Admitting: *Deleted

## 2022-03-24 NOTE — Progress Notes (Deleted)
?Cardiology Office Note:   ? ?Date:  03/25/2022  ? ?ID:  Eugene White, DOB [birth year], MRN [id number] ? ?PCP:  [name], PA-C ?  ?[facility] Providers ?Cardiologist:  None { ? ? ?Referring MD: [name], PA-C  ? ? ?History of Present Illness:   ? 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 ?  ?He initially underwent surgery for left femoral nail removal in 2018. During surgery he went into atrial fib with RVR which was new for him. 2-D echo showed normal LV function EF 55-60% with mildly dilated left atrium and mild LVH. He was started on Eliquis. He eventually underwent TEE/DCCV 12/2017 with conversion to NSR. TEE had shown moderate MR/TR as well. Since that time he has required additional DCCV in 12/2018 for recurrence. He has required medication adjustment since then. He was ultimately placed on flecainide in 08/2019 and underwent repeat DCCV 08/2019. He does not have prior ischemic assessment. I recommended ETT at [facility] 12/2019 and the patient wished to defer. Prior carotid duplex 12/2017 showed 1-39% stenosis on the left, felt to be normal by Dr. [name]. At visit 12/2020 with Dr. [name] the patient requested to go off flecainide since he had not had any recurrent issues with atrial fibrillation. Repeat echo 12/2020 EF 55-60%, mild LAE, mild MR/TR, aortic sclerosis without stenosis. ? ?Was last seen in clinic on 03/13/22 where he was feeling fatigued and unwell. Was in Afib at that time.  ? ?Today, *** ? ? ?Past Medical History:  ?Diagnosis Date  ? Adult hypothyroidism 05/31/2009  ? Anemia   ? hx of one time  ? Arthritis   ? OA  ? Closed intertrochanteric fracture of hip, left, initial encounter ([place]) 10/10/2016  ? Colon, diverticulosis 05/31/2009  ? Difficulty  sleeping   ? Diverticulitis large intestine 05/31/2009  ? Diverticulosis 2014  ? 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 First degree AV block   ? GERD (gastroesophageal reflux disease)   ? GI symptom   ? had nausea / vomited once / frequent stools / getting better  ? Hyperlipidemia   ? Hypertension   ? Hypothyroidism   ? Mild carotid artery disease ([place])   ? Mitral regurgitation   ? Mitral regurgitation   ? Mood changes   ? PAF (paroxysmal atrial fibrillation) ([place])   ? Pneumonia   ? hx of  ? Tricuspid regurgitation   ? Tricuspid regurgitation   ? ? ?Past Surgical History:  ?Procedure Laterality Date  ? CARDIOVERSION N/A 01/12/2018  ? Procedure: CARDIOVERSION;  Surgeon: Dorothy Spark, MD;  Location: [place];  Service: Cardiovascular;  Laterality: N/A;  ? CARDIOVERSION N/A 01/26/2019  ? Procedure: CARDIOVERSION;  Surgeon: Jerline Pain, MD;  Location: [facility] ENDOSCOPY;  Service: Cardiovascular;  Laterality: N/A;  ? CARDIOVERSION N/A 09/30/2019  ? Procedure: CARDIOVERSION;  Surgeon: Lelon Perla, MD;  Location: [facility] ENDOSCOPY;  Service: Cardiovascular;  Laterality: N/A;  ? COLONOSCOPY  2014  ? FEMUR IM NAIL Left 10/11/2016  ? Procedure: INTRAMEDULLARY (IM) NAIL FEMORAL;  Surgeon: [name], MD;  Location: [facility] ORS;  Service: Orthopedics;  Laterality: Left;  ? HAND SURGERY   1973 / 2010 / 2015  ? right to release  tendons  ? HARDWARE REMOVAL Left 10/05/2017  ? Procedure: Removal of left femoral nail;  Surgeon: [name], MD;  [facility];  Service: Orthopedics;  Laterality: Left;  90 mins  ? HEMATOMA EVACUATION Left 01/19/2018  ? Procedure: Irrigation and debridement, Evacuation of left total knee hematoma;  Surgeon: [name], MD;  [facility];  Service: Orthopedics;  Laterality: Left;  ? KNEE ARTHROSCOPY  1984 & 2001  ? twice  ? NASAL SEPTUM SURGERY    ? removal Left femoral nail     ? 10/05/17 Dr. [name]  ? [place] & 2006  ? Right and  left  ? TEE WITH CARDIOVERSION  01/12/2018  ? TEE WITHOUT CARDIOVERSION N/A 01/12/2018  ? Procedure: TRANSESOPHAGEAL ECHOCARDIOGRAM (TEE);  Surgeon: Dorothy Spark, MD;  Location: [place];  Service: Cardiovascular;  Laterality: N/A;  ? TONSILLECTOMY    ? TOTAL KNEE ARTHROPLASTY Right 01/09/2015  ? Procedure: RIGHT TOTAL KNEE ARTHROPLASTY;  Surgeon: [name], MD;  [facility];  Service: Orthopedics;  Laterality: Right;  ? TOTAL KNEE ARTHROPLASTY Left 11/23/2017  ? Procedure: LEFT TOTAL KNEE ARTHROPLASTY;  Surgeon: [name], MD;  [facility];  Service: Orthopedics;  Laterality: Left;  90 mins  ? ? ?Current Medications: ?Current Meds  ?Medication Sig  ? acetaminophen (TYLENOL) 500 MG tablet Take 1,500 mg by mouth at bedtime as needed for mild pain or moderate pain.   ? amLODipine (NORVASC) 10 MG tablet Take 1 tablet (10 mg total) by mouth daily.  ? amoxicillin (AMOXIL) 500 MG capsule Take 4 capsules by mouth as directed. Before dental appointment  ? apixaban (ELIQUIS) 5 MG TABS tablet Take 1 tablet (5 mg total) by mouth 2 (two) times daily. Due for follow-up with MD, Will need office visit for FUTURE refills.  ? atorvastatin (LIPITOR) 20 MG tablet Take 20 mg by mouth daily.  ? Cholecalciferol (VITAMIN D) 2000 UNITS CAPS Take 2,000 Units by mouth every evening.   ? ibuprofen (ADVIL) 200 MG tablet Take 800 mg by mouth daily as needed for moderate pain.  ? levothyroxine (SYNTHROID) 150 MCG tablet Take 1 tablet (150 mcg total) by mouth daily.  ? magnesium oxide (MAG-OX) 400 (240 Mg) MG tablet TAKE 1 TABLET TWICE DAILY .  ? Menthol, Topical Analgesic, (BLUE-EMU MAXIMUM STRENGTH EX) Apply 1 application topically at bedtime as needed (pain).  ? metoprolol tartrate (LOPRESSOR) 25 MG tablet Take 0.5 tablets (12.5 mg total) by mouth 2 (two) times daily.  ? Multiple Vitamin (MULTIVITAMIN WITH MINERALS) TABS tablet Take 1 tablet by mouth daily.   ? Multiple Vitamins-Minerals (PRESERVISION AREDS 2) CAPS  Take 1 capsule by mouth 2 (two) times daily.   ? pantoprazole (PROTONIX) 40 MG tablet Take 1 tablet (40 mg total) by mouth daily.  ? potassium chloride SA (KLOR-CON M20) 20 MEQ tablet Take 2 tablets (40 mEq total) by mouth daily.  ? venlafaxine XR (EFFEXOR-XR) 75 MG 24 hr capsule TAKE 1 CAPSULE EVERY DAY (NEED MD APPOINTMENT FOR REFILLS)  ?  ? ?Allergies:   Other and Sulfa antibiotics  ? ?Social History  ? ?Socioeconomic History  ? Marital status: Married  ?  Spouse name: Not on file  ? Number of children: 2  ? Years of education: Not on file  ? Highest education level: Professional school degree (e.g., MD, DDS, DVM, JD)  ?Occupational History  ? Occupation: retired, has degree in physiology  ?Tobacco Use  ? Smoking status:  Former  ?  Types: Cigarettes  ?  Quit date: 12/29/1968  ?  Years since quitting: 53.2  ? Smokeless tobacco: Never  ?Vaping Use  ? Vaping Use: Never used  ?Substance and Sexual Activity  ? Alcohol use: Yes  ?  Alcohol/week: 12.0 standard drinks  ?  Types: 12 Cans of beer per week  ?  Comment: weekly  ? Drug use: No  ? Sexual activity: Yes  ?Other Topics Concern  ? Not on file  ?Social History Narrative  ? Pt lives in [place] w/ wife.  ? ?Social Determinants of Health  ? ?Financial Resource Strain: Not on file  ?Food Insecurity: Not on file  ?Transportation Needs: Not on file  ?Physical Activity: Not on file  ?Stress: Not on file  ?Social Connections: Not on file  ?  ? ?Family History: ?The patient's family history includes Breast cancer in his mother. There is no history of Colon cancer, Esophageal cancer, Stomach cancer, or Rectal cancer. ? ?ROS:   ?Review of Systems  ?Constitutional:  Positive for malaise/fatigue and weight loss. Negative for chills and fever.  ?HENT:  Negative for ear pain and nosebleeds.   ?Eyes:  Negative for blurred vision and photophobia.  ?Respiratory:  Positive for shortness of breath. Negative for hemoptysis and sputum production.   ?Cardiovascular:  Negative for chest  pain, palpitations, orthopnea, claudication, leg swelling and PND.  ?Gastrointestinal:  Negative for constipation and vomiting.  ?Genitourinary:  Negative for hematuria.  ?Musculoskeletal:  Negative for

## 2022-03-24 NOTE — Telephone Encounter (Signed)
Pts PCP did an EKG on this pt for Dr. Johney Frame, to confirm if he is still in afib or not.  If pt was still in afib, Dr. Johney Frame wanted to bring him back in to arrange a DCCV at that time.  Dr. Johney Frame did review EKG and noted pt is still in afib with a HR of 70 bpm.  ? ?Spoke with the pt and endorsed this to him.  Informed the pt that given he is still in afib, Dr. Johney Frame would like for him to come into the clinic on her DOD day tomorrow 3/28, to be seen and to arrange DCCV at that time.  ? ?Informed the pt we will see him tomorrow 3/28 at 3:30 pm with Dr. Johney Frame.  He should arrive 15 mins prior to this visit.  Advised him that we will schedule his DCCV at that time, obtain pre-procedure labs, update H&P, and provide him his cardioversion instructions at that visit.  ?Pt verbalized understanding and agrees with this plan. ? ?EKG done by PCP on 3/23 and signed by Dr. Johney Frame today, and placed in med rec box to be scanned into the pts chart.  ?

## 2022-03-25 ENCOUNTER — Other Ambulatory Visit: Payer: Self-pay

## 2022-03-25 ENCOUNTER — Encounter: Payer: Self-pay | Admitting: Cardiology

## 2022-03-25 ENCOUNTER — Ambulatory Visit: Payer: Medicare HMO | Admitting: Cardiology

## 2022-03-25 VITALS — BP 116/60 | HR 73 | Ht 70.0 in | Wt 193.0 lb

## 2022-03-25 DIAGNOSIS — I4891 Unspecified atrial fibrillation: Secondary | ICD-10-CM

## 2022-03-25 DIAGNOSIS — I340 Nonrheumatic mitral (valve) insufficiency: Secondary | ICD-10-CM | POA: Diagnosis not present

## 2022-03-25 DIAGNOSIS — I480 Paroxysmal atrial fibrillation: Secondary | ICD-10-CM | POA: Diagnosis not present

## 2022-03-25 DIAGNOSIS — I779 Disorder of arteries and arterioles, unspecified: Secondary | ICD-10-CM | POA: Diagnosis not present

## 2022-03-25 DIAGNOSIS — I10 Essential (primary) hypertension: Secondary | ICD-10-CM

## 2022-03-25 NOTE — Patient Instructions (Signed)
Medication Instructions:  ? ?Your physician recommends that you continue on your current medications as directed. Please refer to the Current Medication list given to you today. ? ?*If you need a refill on your cardiac medications before your next appointment, please call your pharmacy* ? ? ?Testing/Procedures: ? ? ?You are scheduled for a  Cardioversion on Friday 04/04/22 AT 10:30 AM with DR. MARY BRANCH.  Please arrive at the [facility] (Main Entrance A) at [facility]: [address] [place], [place] [address] at 10:30 AM . (1 hour prior to procedure unless lab work is needed; if lab work is needed arrive 1.5 hours ahead) ? ?DIET: Nothing to eat or drink after midnight except a sip of water with medications (see medication instructions below) ? ?FYI: For your safety, and to allow [place] to monitor your vital signs accurately during the surgery/procedure we request that   ?if you have artificial nails, gel coating, SNS etc. Please have those removed prior to your surgery/procedure. Not having the nail coverings /polish removed may result in cancellation or delay of your surgery/procedure. ? ? ?Medication Instructions: ? ? ?Continue your anticoagulant: ELIQUIS  You will need to continue your anticoagulant after your procedure until you are told by your Provider that it is safe to stop ? ? ?Labs: ? ?Come to: SAME DAY AS YOUR CARDIOVERSION ON Friday 04/04/22 BE THERE AT 10:30 AM ? ?(Lab option #3) your lab work will be done at the hospital prior to your procedure - you will need to arrive 1 ? hours ahead of your procedure- ARRIVE AT 10:30 AM ON 04/04/22 ? ?You must have a responsible person to drive you home and stay in the waiting area during your procedure. Failure to do so could result in cancellation. ? ?Interior and spatial designer cards. ? ?*Special Note: Every effort is made to have your procedure done on time. Occasionally there are emergencies that occur at the hospital that may cause delays. Please be patient if a  delay does occur.  ? ? ? ?Follow-Up: ?At [facility], you and your health needs are our priority.  As part of our continuing mission to provide you with exceptional heart care, we have created designated Provider Care Teams.  These Care Teams include your primary Cardiologist (physician) and Advanced Practice Providers (APPs -  Physician Assistants and Nurse Practitioners) who all work together to provide you with the care you need, when you need it. ? ?We recommend signing up for the patient portal called "MyChart".  Sign up information is provided on this After Visit Summary.  MyChart is used to connect with patients for Virtual Visits (Telemedicine).  Patients are able to view lab/test results, encounter notes, upcoming appointments, etc.  Non-urgent messages can be sent to your provider as well.   ?To learn more about what you can do with MyChart, go to [email or web address].   ? ?Your next appointment:   ?6 month(s) ? ?The format for your next appointment:   ?In Person ? ?Provider:   ?DR. PEMBERTON ? ?

## 2022-03-25 NOTE — Progress Notes (Signed)
?Cardiology Office Note:   ? ?Date:  03/25/2022  ? ?ID:  Eugene White, DOB 12-Apr-1946, MRN [id number] ? ?PCP:  [name], PA-C ?  ?[facility] Providers ?Cardiologist:  None { ? ? ?Referring MD: [name], PA-C  ? ? ?History of Present Illness:   ? 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 ?  ?He initially underwent surgery for left femoral nail removal in 2018. During surgery he went into atrial fib with RVR which was new for him. 2-D echo showed normal LV function EF 55-60% with mildly dilated left atrium and mild LVH. He was started on Eliquis. He eventually underwent TEE/DCCV 12/2017 with conversion to NSR. TEE had shown moderate MR/TR as well. Since that time he has required additional DCCV in 12/2018 for recurrence. He has required medication adjustment since then. He was ultimately placed on flecainide in 08/2019 and underwent repeat DCCV 08/2019. He does not have prior ischemic assessment. I recommended ETT at [place] 12/2019 and the patient wished to defer. Prior carotid duplex 12/2017 showed 1-39% stenosis on the left, felt to be normal by Dr. [name]. At visit 12/2020 with Dr. [name] the patient requested to go off flecainide since he had not had any recurrent issues with atrial fibrillation. Repeat echo 12/2020 EF 55-60%, mild LAE, mild MR/TR, aortic sclerosis without stenosis. ? ?Was last seen in clinic on 03/13/22 where he was feeling fatigued and unwell. Was in Afib at that time.  ? ?Today, the patient states that he continues to feel severely fatigued with generalized malaise. Generally, he has been unable to do the activities he needs to do. Today he is feeling worse compared to the last 5 days. ? ?At home he has noticed blood pressures as low as 93/50.  Typically he is able to tell when he is out of rhythm with monitoring his pulse. ? ?He is trying to stay hydrated. However, he also complains of frequent urination. ? ?He denies any chest pain, or peripheral edema. No lightheadedness, headaches, syncope, orthopnea, or PND. ? ? ? ?Past Medical History:  ?Diagnosis Date  ? Adult hypothyroidism 05/31/2009  ? Anemia   ? hx of one time  ? Arthritis   ? OA  ? Closed intertrochanteric fracture of hip, left, initial encounter ([place]) 10/10/2016  ? Colon, diverticulosis 05/31/2009  ? Difficulty sleeping   ? Diverticulitis large intestine 05/31/2009  ? Diverticulosis 2014  ? 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 First degree AV block   ? GERD (gastroesophageal reflux disease)   ? GI symptom   ? had nausea / vomited once / frequent stools / getting better  ? Hyperlipidemia   ? Hypertension   ? Hypothyroidism   ? Mild carotid artery disease ([place])   ? Mitral regurgitation   ? Mitral regurgitation   ? Mood changes   ? PAF (paroxysmal atrial fibrillation) ([place])   ? Pneumonia   ? hx of  ? Tricuspid regurgitation   ? Tricuspid regurgitation   ? ? ?Past Surgical History:  ?Procedure Laterality Date  ? CARDIOVERSION N/A 01/12/2018  ? Procedure: CARDIOVERSION;  Surgeon: Dorothy Spark, MD;  Location: [place];  Service: Cardiovascular;  Laterality: N/A;  ? CARDIOVERSION N/A 01/26/2019  ?  Procedure: CARDIOVERSION;  Surgeon: Jerline Pain, MD;  Location: [facility] ENDOSCOPY;  Service: Cardiovascular;  Laterality: N/A;  ? CARDIOVERSION N/A 09/30/2019  ? Procedure: CARDIOVERSION;  Surgeon: Lelon Perla, MD;  Location: [facility] ENDOSCOPY;  Service: Cardiovascular;  Laterality: N/A;  ? COLONOSCOPY  2014  ? FEMUR IM NAIL Left 10/11/2016  ? Procedure: INTRAMEDULLARY (IM) NAIL FEMORAL;  Surgeon: [name], MD;  Location: [facility] ORS;  Service: Orthopedics;  Laterality: Left;  ? HAND SURGERY   1973 / 2010 / 2015  ? right to release tendons  ? HARDWARE REMOVAL Left  10/05/2017  ? Procedure: Removal of left femoral nail;  Surgeon: [name], MD;  Location: [facility];  Service: Orthopedics;  Laterality: Left;  90 mins  ? HEMATOMA EVACUATION Left 01/19/2018  ? Procedure: Irrigation and debridement, Evacuation of left total knee hematoma;  Surgeon: [name], MD;  Location: [facility];  Service: Orthopedics;  Laterality: Left;  ? KNEE ARTHROSCOPY  1984 & 2001  ? twice  ? NASAL SEPTUM SURGERY    ? removal Left femoral nail     ? 10/05/17 Dr. [name]  ? [place] & 2006  ? Right and left  ? TEE WITH CARDIOVERSION  01/12/2018  ? TEE WITHOUT CARDIOVERSION N/A 01/12/2018  ? Procedure: TRANSESOPHAGEAL ECHOCARDIOGRAM (TEE);  Surgeon: Dorothy Spark, MD;  Location: [place];  Service: Cardiovascular;  Laterality: N/A;  ? TONSILLECTOMY    ? TOTAL KNEE ARTHROPLASTY Right 01/09/2015  ? Procedure: RIGHT TOTAL KNEE ARTHROPLASTY;  Surgeon: [name], MD;  [facility]: [facility];  Service: Orthopedics;  Laterality: Right;  ? TOTAL KNEE ARTHROPLASTY Left 11/23/2017  ? Procedure: LEFT TOTAL KNEE ARTHROPLASTY;  Surgeon: [name], MD;  Location: [facility];  Service: Orthopedics;  Laterality: Left;  90 mins  ? ? ?Current Medications: ?Current Meds  ?Medication Sig  ? acetaminophen (TYLENOL) 500 MG tablet Take 1,500 mg by mouth at bedtime as needed for mild pain or moderate pain.   ? amLODipine (NORVASC) 10 MG tablet Take 1 tablet (10 mg total) by mouth daily.  ? amoxicillin (AMOXIL) 500 MG capsule Take 4 capsules by mouth as directed. Before dental appointment  ? apixaban (ELIQUIS) 5 MG TABS tablet Take 1 tablet (5 mg total) by mouth 2 (two) times daily. Due for follow-up with MD, Will need office visit for FUTURE refills.  ? atorvastatin (LIPITOR) 20 MG tablet Take 20 mg by mouth daily.  ? Cholecalciferol (VITAMIN D) 2000 UNITS CAPS Take 2,000 Units by mouth every evening.   ? ibuprofen (ADVIL) 200 MG tablet Take 800 mg by mouth daily as needed for moderate pain.  ?  levothyroxine (SYNTHROID) 150 MCG tablet Take 1 tablet (150 mcg total) by mouth daily.  ? magnesium oxide (MAG-OX) 400 (240 Mg) MG tablet TAKE 1 TABLET TWICE DAILY .  ? Menthol, Topical Analgesic, (BLUE-EMU MAXIMUM STRENGTH EX) Apply 1 application topically at bedtime as needed (pain).  ? metoprolol tartrate (LOPRESSOR) 25 MG tablet Take 0.5 tablets (12.5 mg total) by mouth 2 (two) times daily.  ? Multiple Vitamin (MULTIVITAMIN WITH MINERALS) TABS tablet Take 1 tablet by mouth daily.   ? Multiple Vitamins-Minerals (PRESERVISION AREDS 2) CAPS Take 1 capsule by mouth 2 (two) times daily.   ? pantoprazole (PROTONIX) 40 MG tablet Take 1 tablet (40 mg total) by mouth daily.  ? potassium chloride SA (KLOR-CON M20) 20 MEQ tablet Take 2 tablets (40 mEq total) by mouth daily.  ? venlafaxine  XR (EFFEXOR-XR) 75 MG 24 hr capsule TAKE 1 CAPSULE EVERY DAY (NEED MD APPOINTMENT FOR REFILLS)  ?  ? ?Allergies:   Other and Sulfa antibiotics  ? ?Social History  ? ?Socioeconomic History  ? Marital status: Married  ?  Spouse name: Not on file  ? Number of children: 2  ? Years of education: Not on file  ? Highest education level: Professional school degree (e.g., MD, DDS, DVM, JD)  ?Occupational History  ? Occupation: retired, has degree in physiology  ?Tobacco Use  ? Smoking status: Former  ?  Types: Cigarettes  ?  Quit date: 12/29/1968  ?  Years since quitting: 53.2  ? Smokeless tobacco: Never  ?Vaping Use  ? Vaping Use: Never used  ?Substance and Sexual Activity  ? Alcohol use: Yes  ?  Alcohol/week: 12.0 standard drinks  ?  Types: 12 Cans of beer per week  ?  Comment: weekly  ? Drug use: No  ? Sexual activity: Yes  ?Other Topics Concern  ? Not on file  ?Social History Narrative  ? Pt lives in [place] w/ wife.  ? ?Social Determinants of Health  ? ?Financial Resource Strain: Not on file  ?Food Insecurity: Not on file  ?Transportation Needs: Not on file  ?Physical Activity: Not on file  ?Stress: Not on file  ?Social Connections: Not on  file  ?  ? ?Family History: ?The patient's family history includes Breast cancer in his mother. There is no history of Colon cancer, Esophageal cancer, Stomach cancer, or Rectal cancer. ? ?ROS:   ?Review of Sy

## 2022-03-25 NOTE — H&P (View-Only) (Signed)
?Cardiology Office Note:   ? ?Date:  03/25/2022  ? ?ID:  Eugene White, DOB [birth year], MRN [id number] ? ?PCP:  [name], PA-C ?  ?[facility] Providers ?Cardiologist:  None { ? ? ?Referring MD: [name], PA-C  ? ? ?History of Present Illness:   ? 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 ?  ?He initially underwent surgery for left femoral nail removal in 2018. During surgery he went into atrial fib with RVR which was new for him. 2-D echo showed normal LV function EF 55-60% with mildly dilated left atrium and mild LVH. He was started on Eliquis. He eventually underwent TEE/DCCV 12/2017 with conversion to NSR. TEE had shown moderate MR/TR as well. Since that time he has required additional DCCV in 12/2018 for recurrence. He has required medication adjustment since then. He was ultimately placed on flecainide in 08/2019 and underwent repeat DCCV 08/2019. He does not have prior ischemic assessment. I recommended ETT at [place] 12/2019 and the patient wished to defer. Prior carotid duplex 12/2017 showed 1-39% stenosis on the left, felt to be normal by Dr. [name]. At visit 12/2020 with Dr. [name] the patient requested to go off flecainide since he had not had any recurrent issues with atrial fibrillation. Repeat echo 12/2020 EF 55-60%, mild LAE, mild MR/TR, aortic sclerosis without stenosis. ? ?Was last seen in clinic on 03/13/22 where he was feeling fatigued and unwell. Was in Afib at that time.  ? ?Today, the patient states that he continues to feel severely fatigued with generalized malaise. Generally, he has been unable to do the activities he needs to do. Today he is feeling worse compared to the last 5 days. ? ?At home he has noticed blood pressures as low as 93/50.  Typically he is able to tell when he is out of rhythm with monitoring his pulse. ? ?He is trying to stay hydrated. However, he also complains of frequent urination. ? ?He denies any chest pain, or peripheral edema. No lightheadedness, headaches, syncope, orthopnea, or PND. ? ? ? ?Past Medical History:  ?Diagnosis Date  ? Adult hypothyroidism 05/31/2009  ? Anemia   ? hx of one time  ? Arthritis   ? OA  ? Closed intertrochanteric fracture of hip, left, initial encounter ([place]) 10/10/2016  ? Colon, diverticulosis 05/31/2009  ? Difficulty sleeping   ? Diverticulitis large intestine 05/31/2009  ? Diverticulosis 2014  ? 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 First degree AV block   ? GERD (gastroesophageal reflux disease)   ? GI symptom   ? had nausea / vomited once / frequent stools / getting better  ? Hyperlipidemia   ? Hypertension   ? Hypothyroidism   ? Mild carotid artery disease ([place])   ? Mitral regurgitation   ? Mitral regurgitation   ? Mood changes   ? PAF (paroxysmal atrial fibrillation) ([place])   ? Pneumonia   ? hx of  ? Tricuspid regurgitation   ? Tricuspid regurgitation   ? ? ?Past Surgical History:  ?Procedure Laterality Date  ? CARDIOVERSION N/A 01/12/2018  ? Procedure: CARDIOVERSION;  Surgeon: Dorothy Spark, MD;  Location: [place];  Service: Cardiovascular;  Laterality: N/A;  ? CARDIOVERSION N/A 01/26/2019  ?  Procedure: CARDIOVERSION;  Surgeon: Jerline Pain, MD;  Location: [facility] ENDOSCOPY;  Service: Cardiovascular;  Laterality: N/A;  ? CARDIOVERSION N/A 09/30/2019  ? Procedure: CARDIOVERSION;  Surgeon: Lelon Perla, MD;  Location: [facility] ENDOSCOPY;  Service: Cardiovascular;  Laterality: N/A;  ? COLONOSCOPY  2014  ? FEMUR IM NAIL Left 10/11/2016  ? Procedure: INTRAMEDULLARY (IM) NAIL FEMORAL;  Surgeon: [name], MD;  Location: [facility] ORS;  Service: Orthopedics;  Laterality: Left;  ? HAND SURGERY   1973 / 2010 / 2015  ? right to release tendons  ? HARDWARE REMOVAL Left  10/05/2017  ? Procedure: Removal of left femoral nail;  Surgeon: [name], MD;  Location: [facility];  Service: Orthopedics;  Laterality: Left;  90 mins  ? HEMATOMA EVACUATION Left 01/19/2018  ? Procedure: Irrigation and debridement, Evacuation of left total knee hematoma;  Surgeon: [name], MD;  Location: [facility];  Service: Orthopedics;  Laterality: Left;  ? KNEE ARTHROSCOPY  1984 & 2001  ? twice  ? NASAL SEPTUM SURGERY    ? removal Left femoral nail     ? 10/05/17 Dr. [name]  ? [place] & 2006  ? Right and left  ? TEE WITH CARDIOVERSION  01/12/2018  ? TEE WITHOUT CARDIOVERSION N/A 01/12/2018  ? Procedure: TRANSESOPHAGEAL ECHOCARDIOGRAM (TEE);  Surgeon: Dorothy Spark, MD;  Location: [place];  Service: Cardiovascular;  Laterality: N/A;  ? TONSILLECTOMY    ? TOTAL KNEE ARTHROPLASTY Right 01/09/2015  ? Procedure: RIGHT TOTAL KNEE ARTHROPLASTY;  Surgeon: [name], MD;  Location: [facility];  Service: Orthopedics;  Laterality: Right;  ? TOTAL KNEE ARTHROPLASTY Left 11/23/2017  ? Procedure: LEFT TOTAL KNEE ARTHROPLASTY;  Surgeon: [name], MD;  Location: [facility];  Service: Orthopedics;  Laterality: Left;  90 mins  ? ? ?Current Medications: ?Current Meds  ?Medication Sig  ? acetaminophen (TYLENOL) 500 MG tablet Take 1,500 mg by mouth at bedtime as needed for mild pain or moderate pain.   ? amLODipine (NORVASC) 10 MG tablet Take 1 tablet (10 mg total) by mouth daily.  ? amoxicillin (AMOXIL) 500 MG capsule Take 4 capsules by mouth as directed. Before dental appointment  ? apixaban (ELIQUIS) 5 MG TABS tablet Take 1 tablet (5 mg total) by mouth 2 (two) times daily. Due for follow-up with MD, Will need office visit for FUTURE refills.  ? atorvastatin (LIPITOR) 20 MG tablet Take 20 mg by mouth daily.  ? Cholecalciferol (VITAMIN D) 2000 UNITS CAPS Take 2,000 Units by mouth every evening.   ? ibuprofen (ADVIL) 200 MG tablet Take 800 mg by mouth daily as needed for moderate pain.  ?  levothyroxine (SYNTHROID) 150 MCG tablet Take 1 tablet (150 mcg total) by mouth daily.  ? magnesium oxide (MAG-OX) 400 (240 Mg) MG tablet TAKE 1 TABLET TWICE DAILY .  ? Menthol, Topical Analgesic, (BLUE-EMU MAXIMUM STRENGTH EX) Apply 1 application topically at bedtime as needed (pain).  ? metoprolol tartrate (LOPRESSOR) 25 MG tablet Take 0.5 tablets (12.5 mg total) by mouth 2 (two) times daily.  ? Multiple Vitamin (MULTIVITAMIN WITH MINERALS) TABS tablet Take 1 tablet by mouth daily.   ? Multiple Vitamins-Minerals (PRESERVISION AREDS 2) CAPS Take 1 capsule by mouth 2 (two) times daily.   ? pantoprazole (PROTONIX) 40 MG tablet Take 1 tablet (40 mg total) by mouth daily.  ? potassium chloride SA (KLOR-CON M20) 20 MEQ tablet Take 2 tablets (40 mEq total) by mouth daily.  ? venlafaxine  XR (EFFEXOR-XR) 75 MG 24 hr capsule TAKE 1 CAPSULE EVERY DAY (NEED MD APPOINTMENT FOR REFILLS)  ?  ? ?Allergies:   Other and Sulfa antibiotics  ? ?Social History  ? ?Socioeconomic History  ? Marital status: Married  ?  Spouse name: Not on file  ? Number of children: 2  ? Years of education: Not on file  ? Highest education level: Professional school degree (e.g., MD, DDS, DVM, JD)  ?Occupational History  ? Occupation: retired, has degree in physiology  ?Tobacco Use  ? Smoking status: Former  ?  Types: Cigarettes  ?  Quit date: 12/29/1968  ?  Years since quitting: 53.2  ? Smokeless tobacco: Never  ?Vaping Use  ? Vaping Use: Never used  ?Substance and Sexual Activity  ? Alcohol use: Yes  ?  Alcohol/week: 12.0 standard drinks  ?  Types: 12 Cans of beer per week  ?  Comment: weekly  ? Drug use: No  ? Sexual activity: Yes  ?Other Topics Concern  ? Not on file  ?Social History Narrative  ? Pt lives in [place] w/ wife.  ? ?Social Determinants of Health  ? ?Financial Resource Strain: Not on file  ?Food Insecurity: Not on file  ?Transportation Needs: Not on file  ?Physical Activity: Not on file  ?Stress: Not on file  ?Social Connections: Not on  file  ?  ? ?Family History: ?The patient's family history includes Breast cancer in his mother. There is no history of Colon cancer, Esophageal cancer, Stomach cancer, or Rectal cancer. ? ?ROS:   ?Review of Sy

## 2022-03-27 ENCOUNTER — Encounter (HOSPITAL_COMMUNITY): Payer: Self-pay | Admitting: Internal Medicine

## 2022-04-04 ENCOUNTER — Ambulatory Visit (HOSPITAL_BASED_OUTPATIENT_CLINIC_OR_DEPARTMENT_OTHER): Payer: Medicare HMO | Admitting: Anesthesiology

## 2022-04-04 ENCOUNTER — Ambulatory Visit (HOSPITAL_COMMUNITY): Payer: Medicare HMO | Admitting: Anesthesiology

## 2022-04-04 ENCOUNTER — Encounter (HOSPITAL_COMMUNITY): Payer: Self-pay | Admitting: Internal Medicine

## 2022-04-04 ENCOUNTER — Ambulatory Visit (HOSPITAL_COMMUNITY)
Admission: RE | Admit: 2022-04-04 | Discharge: 2022-04-04 | Disposition: A | Discharge status: Home / Self Care | Payer: Medicare HMO | Attending: Internal Medicine | Admitting: Internal Medicine

## 2022-04-04 ENCOUNTER — Encounter (HOSPITAL_COMMUNITY)
Admission: RE | Disposition: A | Discharge status: Home / Self Care | Payer: Self-pay | Source: Home / Self Care | Attending: Internal Medicine

## 2022-04-04 DIAGNOSIS — I10 Essential (primary) hypertension: Secondary | ICD-10-CM | POA: Diagnosis not present

## 2022-04-04 DIAGNOSIS — Z87891 Personal history of nicotine dependence: Secondary | ICD-10-CM | POA: Insufficient documentation

## 2022-04-04 DIAGNOSIS — F32A Depression, unspecified: Secondary | ICD-10-CM | POA: Insufficient documentation

## 2022-04-04 DIAGNOSIS — M1990 Unspecified osteoarthritis, unspecified site: Secondary | ICD-10-CM

## 2022-04-04 DIAGNOSIS — I340 Nonrheumatic mitral (valve) insufficiency: Secondary | ICD-10-CM | POA: Insufficient documentation

## 2022-04-04 DIAGNOSIS — E785 Hyperlipidemia, unspecified: Secondary | ICD-10-CM | POA: Diagnosis not present

## 2022-04-04 DIAGNOSIS — I4891 Unspecified atrial fibrillation: Secondary | ICD-10-CM | POA: Diagnosis not present

## 2022-04-04 DIAGNOSIS — E039 Hypothyroidism, unspecified: Secondary | ICD-10-CM

## 2022-04-04 DIAGNOSIS — K219 Gastro-esophageal reflux disease without esophagitis: Secondary | ICD-10-CM | POA: Diagnosis not present

## 2022-04-04 DIAGNOSIS — I2510 Atherosclerotic heart disease of native coronary artery without angina pectoris: Secondary | ICD-10-CM | POA: Insufficient documentation

## 2022-04-04 DIAGNOSIS — I4819 Other persistent atrial fibrillation: Secondary | ICD-10-CM

## 2022-04-04 DIAGNOSIS — Z79899 Other long term (current) drug therapy: Secondary | ICD-10-CM | POA: Insufficient documentation

## 2022-04-04 DIAGNOSIS — I480 Paroxysmal atrial fibrillation: Secondary | ICD-10-CM | POA: Insufficient documentation

## 2022-04-04 HISTORY — PX: CARDIOVERSION: SHX1299

## 2022-04-04 LAB — POCT I-STAT, CHEM 8
BUN: 11 mg/dL (ref 8–23)
Calcium, Ion: 1.12 mmol/L — ABNORMAL LOW (ref 1.15–1.40)
Chloride: 102 mmol/L (ref 98–111)
Creatinine, Ser: 0.9 mg/dL (ref 0.61–1.24)
Glucose, Bld: 116 mg/dL — ABNORMAL HIGH (ref 70–99)
HCT: 46 % (ref 39.0–52.0)
Hemoglobin: 15.6 g/dL (ref 13.0–17.0)
Potassium: 4.6 mmol/L (ref 3.5–5.1)
Sodium: 139 mmol/L (ref 135–145)
TCO2: 27 mmol/L (ref 22–32)

## 2022-04-04 SURGERY — CARDIOVERSION
Anesthesia: General

## 2022-04-04 MED ORDER — PROPOFOL 10 MG/ML IV BOLUS
INTRAVENOUS | Status: DC | PRN
  Administered 2022-04-04: 70 mg via INTRAVENOUS

## 2022-04-04 MED ORDER — SODIUM CHLORIDE 0.9 % IV SOLN: INTRAVENOUS | Status: DC | PRN

## 2022-04-04 MED ORDER — LIDOCAINE 2% (20 MG/ML) 5 ML SYRINGE
INTRAMUSCULAR | Status: DC | PRN
Start: 2022-04-04 — End: 2022-04-04
  Administered 2022-04-04: 100 mg via INTRAVENOUS

## 2022-04-04 MED ORDER — SODIUM CHLORIDE 0.9 % IV SOLN: INTRAVENOUS | Status: DC

## 2022-04-04 NOTE — CV Procedure (Signed)
Procedure: Electrical Cardioversion ?Indications:  Atrial Fibrillation ? ?Procedure Details: ? ?Consent: Risks of procedure as well as the alternatives and risks of each were explained to the (patient/caregiver).  Consent for procedure obtained. ? ?Time Out: Verified patient identification, verified procedure, site/side was marked, verified correct patient position, special equipment/implants available, medications/allergies/relevent history reviewed, required imaging and test results available. PERFORMED. ? ?Patient placed on cardiac monitor, pulse oximetry, supplemental oxygen as necessary.  ?Sedation given:  Propofol ?Pacer pads placed anterior and posterior chest. ? ?Cardioverted 1 time(s).  ?Cardioversion with synchronized biphasic 200J shock. ? ?Evaluation: ?Findings: Post procedure EKG shows:  Sinus rhythm, sinus tachycardia, 1st degree AV block ?Complications: None ?Patient did tolerate procedure well. ? ?Time Spent Directly with the Patient: ? ?20 minutes  ? ?Phineas Inches E ?04/04/2022, 12:44 PM ? ?

## 2022-04-04 NOTE — Anesthesia Preprocedure Evaluation (Addendum)
Anesthesia Evaluation  ?Patient identified by MRN, date of birth, ID band ?Patient awake ? ? ? ?Reviewed: ?Allergy & Precautions, NPO status , Patient's Chart, lab work & pertinent test results ? ?History of Anesthesia Complications ?Negative for: history of anesthetic complications ? ?Airway ?Mallampati: II ? ?TM Distance: >3 FB ?Neck ROM: Full ? ? ? Dental ? ?(+) Edentulous Upper, Edentulous Lower, Dental Advisory Given ?  ?Pulmonary ?former smoker,  ?09/27/2019 SARS Coronavirus NEG ?  ?Pulmonary exam normal ? ? ? ? ? ? ? Cardiovascular ?hypertension, Pt. on medications and Pt. on home beta blockers ?+ dysrhythmias Atrial Fibrillation  ?Rhythm:Irregular Rate:Normal ? ?'19 ECHO: EF 55-60%, mild AI, mod MR, mod TR ?  ?Neuro/Psych ?Depression Facial nerve palsy since birth ?  ? GI/Hepatic ?Neg liver ROS, GERD  Controlled,  ?Endo/Other  ?Hypothyroidism  ? Renal/GU ?negative Renal ROS  ? ?  ?Musculoskeletal ? ?(+) Arthritis ,  ? Abdominal ?  ?Peds ? Hematology ?eliquis   ?Anesthesia Other Findings ? ? Reproductive/Obstetrics ? ?  ? ? ? ? ? ? ? ? ? ? ? ? ? ?  ?  ? ? ? ? ? ? ? ?Anesthesia Physical ? ?Anesthesia Plan ? ?ASA: 3 ? ?Anesthesia Plan: General  ? ?Post-op Pain Management: Minimal or no pain anticipated  ? ?Induction: Intravenous ? ?PONV Risk Score and Plan: 2 and Treatment may vary due to age or medical condition, TIVA and Propofol infusion ? ?Airway Management Planned: Natural Airway, Mask and Simple Face Mask ? ?Additional Equipment:  ? ?Intra-op Plan:  ? ?Post-operative Plan:  ? ?Informed Consent: I have reviewed the patients History and Physical, chart, labs and discussed the procedure including the risks, benefits and alternatives for the proposed anesthesia with the patient or authorized representative who has indicated his/her understanding and acceptance.  ? ? ? ?Dental advisory given ? ?Plan Discussed with: Anesthesiologist and CRNA ? ?Anesthesia Plan Comments:    ? ? ? ? ? ?Anesthesia Quick Evaluation ? ?

## 2022-04-04 NOTE — Anesthesia Procedure Notes (Signed)
Procedure Name: General with mask airway ?Date/Time: 04/04/2022 12:02 PM ?Performed by: Leonor Liv, CRNA ?Pre-anesthesia Checklist: Patient identified, Emergency Drugs available, Suction available, Patient being monitored and Timeout performed ?Patient Re-evaluated:Patient Re-evaluated prior to induction ?Oxygen Delivery Method: Ambu bag ?Induction Type: IV induction ?Placement Confirmation: positive ETCO2 ?Dental Injury: Teeth and Oropharynx as per pre-operative assessment  ? ? ? ? ?

## 2022-04-04 NOTE — Interval H&P Note (Signed)
History and Physical Interval Note: ? ?04/04/2022 ?12:43 PM ? ?[name]  has presented today for surgery, with the diagnosis of AFIB.  The various methods of treatment have been discussed with the patient and family. After consideration of risks, benefits and other options for treatment, the patient has consented to  Procedure(s): ?CARDIOVERSION (N/A) as a surgical intervention.  The patient's history has been reviewed, patient examined, no change in status, stable for surgery.  I have reviewed the patient's chart and labs.  Questions were answered to the patient's satisfaction.   ? ? ?Eugene White E ? ? ?

## 2022-04-04 NOTE — Discharge Instructions (Signed)

## 2022-04-04 NOTE — Transfer of Care (Signed)
Immediate Anesthesia Transfer of Care Note ? ?Patient: Eugene White ? ?Procedure(s) Performed: CARDIOVERSION ? ?Patient Location: Endoscopy Unit ? ?Anesthesia Type:General ? ?Level of Consciousness: awake, alert  and oriented ? ?Airway & Oxygen Therapy: Patient Spontanous Breathing and Patient connected to nasal cannula oxygen ? ?Post-op Assessment: Report given to RN, Post -op Vital signs reviewed and stable and Patient moving all extremities ? ?Post vital signs: Reviewed and stable ? ?Last Vitals:  ?Vitals Value Taken Time  ?BP    ?Temp    ?Pulse    ?Resp    ?SpO2    ? ? ?Last Pain:  ?Vitals:  ? 04/04/22 1042  ?TempSrc: Tympanic  ?PainSc: 0-No pain  ?   ? ?  ? ?Complications: No notable events documented. ?

## 2022-04-04 NOTE — Anesthesia Postprocedure Evaluation (Signed)
Anesthesia Post Note ? ?Patient: YOUSOF White ? ?Procedure(s) Performed: CARDIOVERSION ? ?  ? ?Patient location during evaluation: Endoscopy ?Anesthesia Type: General ?Level of consciousness: sedated ?Pain management: pain level controlled ?Vital Signs Assessment: post-procedure vital signs reviewed and stable ?Respiratory status: spontaneous breathing and respiratory function stable ?Cardiovascular status: stable ?Postop Assessment: no apparent nausea or vomiting ?Anesthetic complications: no ? ? ?No notable events documented. ? ?Last Vitals:  ?Vitals:  ? 04/04/22 1220 04/04/22 1230  ?BP: 126/87 138/81  ?Pulse: (!) 104 65  ?Resp: 14 13  ?Temp:    ?SpO2: 100% 99%  ?  ?Last Pain:  ?Vitals:  ? 04/04/22 1230  ?TempSrc:   ?PainSc: 0-No pain  ? ? ?  ?  ?  ?  ?  ?  ? ?Kalinda Romaniello DANIEL ? ? ? ? ?

## 2022-04-11 ENCOUNTER — Telehealth: Payer: Self-pay | Admitting: Physician Assistant

## 2022-04-11 DIAGNOSIS — E039 Hypothyroidism, unspecified: Secondary | ICD-10-CM

## 2022-04-11 MED ORDER — LEVOTHYROXINE SODIUM 150 MCG PO TABS: 150.0000 ug | ORAL_TABLET | Freq: Every day | ORAL | 1 refills | Status: DC

## 2022-04-11 NOTE — Telephone Encounter (Signed)
[facility] faxed refill request for the following medications: ? ? ?levothyroxine (SYNTHROID) 150 MCG tablet  ?Please advise. ? ?

## 2022-04-17 ENCOUNTER — Telehealth: Payer: Self-pay | Admitting: Physician Assistant

## 2022-04-17 ENCOUNTER — Other Ambulatory Visit: Payer: Self-pay

## 2022-04-17 DIAGNOSIS — K219 Gastro-esophageal reflux disease without esophagitis: Secondary | ICD-10-CM

## 2022-04-17 NOTE — Telephone Encounter (Signed)
[facility] faxed refill request for the following medications:   pantoprazole (PROTONIX) 40 MG tablet   Please advise.  

## 2022-04-18 ENCOUNTER — Telehealth: Payer: Self-pay | Admitting: Physician Assistant

## 2022-04-18 DIAGNOSIS — K219 Gastro-esophageal reflux disease without esophagitis: Secondary | ICD-10-CM

## 2022-04-18 MED ORDER — PANTOPRAZOLE SODIUM 40 MG PO TBEC: 40.0000 mg | DELAYED_RELEASE_TABLET | Freq: Every day | ORAL | 1 refills | Status: DC

## 2022-04-18 NOTE — Telephone Encounter (Signed)
[facility] faxed refill request for the following medications:   pantoprazole (PROTONIX) 40 MG tablet  Please advise  

## 2022-05-27 ENCOUNTER — Other Ambulatory Visit: Payer: Self-pay

## 2022-05-27 ENCOUNTER — Telehealth: Payer: Self-pay | Admitting: Physician Assistant

## 2022-05-27 MED ORDER — ATORVASTATIN CALCIUM 20 MG PO TABS: 20.0000 mg | ORAL_TABLET | Freq: Every day | ORAL | 1 refills | Status: DC

## 2022-05-27 NOTE — Telephone Encounter (Signed)
[facility] faxed refill request for the following medications:   atorvastatin (LIPITOR) 20 MG tablet     Please advise.  

## 2022-05-29 ENCOUNTER — Other Ambulatory Visit: Payer: Self-pay

## 2022-05-29 MED ORDER — ATORVASTATIN CALCIUM 20 MG PO TABS: 20.0000 mg | ORAL_TABLET | Freq: Every day | ORAL | 1 refills | Status: DC

## 2022-05-29 NOTE — Telephone Encounter (Signed)
Pt has called re above atorvastatin (LIPITOR) 20 MG tablet 90 tablet 1 05/27/2022    Sig - Route: Take 1 tablet (20 mg total) by mouth daily. - Oral   Sent to pharmacy as: atorvastatin (LIPITOR) 20 MG tablet   E-Prescribing Status: Receipt confirmed by pharmacy (05/27/2022  1:47 PM EDT   States to pls rewrite script and send to [facility] - [place], [place]  [place] [address]  Phone: [phone] Fax: [phone]

## 2022-06-11 ENCOUNTER — Telehealth: Payer: Self-pay | Admitting: Physician Assistant

## 2022-06-11 NOTE — Telephone Encounter (Signed)
[facility] faxed refill request for the following medications:  atorvastatin (LIPITOR) 40 MG tablet  Not on current medication list  Please advise.

## 2022-06-16 ENCOUNTER — Telehealth: Payer: Self-pay | Admitting: Physician Assistant

## 2022-06-16 NOTE — Telephone Encounter (Signed)
Copied from [place] [id number]. Topic: Medicare AWV >> Jun 16, 2022 10:25 AM Jae Dire wrote: Reason for CRM:  Left message for patient to call back and schedule Medicare Annual Wellness Visit (AWV) in office.   If unable to come into the office for AWV,  please offer to do virtually or by telephone.  Last AWV: 10/11/2019  Please schedule at anytime with [facility] Health Advisor.  30 minute appointment for Virtual or phone 45 minute appointment for in office or Initial virtual/phone  Any questions, please contact me at [phone]

## 2022-06-16 NOTE — Telephone Encounter (Signed)
Patient would like someone to contact him in reference to on going issue he stated about medication refills being sent to wrong pharmacy.    Told me that all medications should be sent to [place], [place]

## 2022-06-18 ENCOUNTER — Encounter: Payer: Self-pay | Admitting: Anesthesiology

## 2022-06-18 ENCOUNTER — Emergency Department: Payer: Medicare HMO | Admitting: Anesthesiology

## 2022-06-18 ENCOUNTER — Emergency Department: Payer: Medicare HMO

## 2022-06-18 ENCOUNTER — Inpatient Hospital Stay
Admission: EM | Admit: 2022-06-18 | Discharge: 2022-06-20 | DRG: 660 | Disposition: A | Discharge status: Home / Self Care | Payer: Medicare HMO | Attending: Osteopathic Medicine | Admitting: Osteopathic Medicine

## 2022-06-18 ENCOUNTER — Encounter
Admission: EM | Disposition: A | Discharge status: Home / Self Care | Payer: Self-pay | Source: Home / Self Care | Attending: Osteopathic Medicine

## 2022-06-18 ENCOUNTER — Telehealth: Payer: Self-pay

## 2022-06-18 ENCOUNTER — Other Ambulatory Visit: Payer: Self-pay

## 2022-06-18 DIAGNOSIS — E039 Hypothyroidism, unspecified: Secondary | ICD-10-CM

## 2022-06-18 DIAGNOSIS — Z7901 Long term (current) use of anticoagulants: Secondary | ICD-10-CM

## 2022-06-18 DIAGNOSIS — I4891 Unspecified atrial fibrillation: Secondary | ICD-10-CM | POA: Diagnosis not present

## 2022-06-18 DIAGNOSIS — Z882 Allergy status to sulfonamides status: Secondary | ICD-10-CM

## 2022-06-18 DIAGNOSIS — I480 Paroxysmal atrial fibrillation: Secondary | ICD-10-CM

## 2022-06-18 DIAGNOSIS — Z87891 Personal history of nicotine dependence: Secondary | ICD-10-CM

## 2022-06-18 DIAGNOSIS — G4700 Insomnia, unspecified: Secondary | ICD-10-CM | POA: Diagnosis present

## 2022-06-18 DIAGNOSIS — K219 Gastro-esophageal reflux disease without esophagitis: Secondary | ICD-10-CM | POA: Diagnosis present

## 2022-06-18 DIAGNOSIS — D649 Anemia, unspecified: Secondary | ICD-10-CM

## 2022-06-18 DIAGNOSIS — G510 Bell's palsy: Secondary | ICD-10-CM | POA: Diagnosis not present

## 2022-06-18 DIAGNOSIS — M1990 Unspecified osteoarthritis, unspecified site: Secondary | ICD-10-CM | POA: Diagnosis present

## 2022-06-18 DIAGNOSIS — Z79899 Other long term (current) drug therapy: Secondary | ICD-10-CM

## 2022-06-18 DIAGNOSIS — D62 Acute posthemorrhagic anemia: Secondary | ICD-10-CM

## 2022-06-18 DIAGNOSIS — F32A Depression, unspecified: Secondary | ICD-10-CM | POA: Diagnosis not present

## 2022-06-18 DIAGNOSIS — R54 Age-related physical debility: Secondary | ICD-10-CM | POA: Diagnosis not present

## 2022-06-18 DIAGNOSIS — K7689 Other specified diseases of liver: Secondary | ICD-10-CM | POA: Diagnosis not present

## 2022-06-18 DIAGNOSIS — E7800 Pure hypercholesterolemia, unspecified: Secondary | ICD-10-CM | POA: Diagnosis present

## 2022-06-18 DIAGNOSIS — I10 Essential (primary) hypertension: Secondary | ICD-10-CM | POA: Diagnosis not present

## 2022-06-18 DIAGNOSIS — Z79890 Hormone replacement therapy (postmenopausal): Secondary | ICD-10-CM | POA: Diagnosis not present

## 2022-06-18 DIAGNOSIS — R000 Tachycardia, unspecified: Secondary | ICD-10-CM | POA: Insufficient documentation

## 2022-06-18 DIAGNOSIS — K449 Diaphragmatic hernia without obstruction or gangrene: Secondary | ICD-10-CM | POA: Diagnosis not present

## 2022-06-18 DIAGNOSIS — R296 Repeated falls: Secondary | ICD-10-CM | POA: Diagnosis present

## 2022-06-18 DIAGNOSIS — R109 Unspecified abdominal pain: Secondary | ICD-10-CM | POA: Diagnosis not present

## 2022-06-18 DIAGNOSIS — R1032 Left lower quadrant pain: Secondary | ICD-10-CM | POA: Diagnosis not present

## 2022-06-18 DIAGNOSIS — A419 Sepsis, unspecified organism: Secondary | ICD-10-CM | POA: Diagnosis not present

## 2022-06-18 DIAGNOSIS — R310 Gross hematuria: Secondary | ICD-10-CM | POA: Diagnosis present

## 2022-06-18 DIAGNOSIS — R0602 Shortness of breath: Secondary | ICD-10-CM | POA: Diagnosis not present

## 2022-06-18 DIAGNOSIS — Z96653 Presence of artificial knee joint, bilateral: Secondary | ICD-10-CM | POA: Diagnosis present

## 2022-06-18 DIAGNOSIS — N1330 Unspecified hydronephrosis: Secondary | ICD-10-CM | POA: Diagnosis not present

## 2022-06-18 DIAGNOSIS — K5730 Diverticulosis of large intestine without perforation or abscess without bleeding: Secondary | ICD-10-CM | POA: Diagnosis not present

## 2022-06-18 DIAGNOSIS — I471 Supraventricular tachycardia: Secondary | ICD-10-CM | POA: Diagnosis not present

## 2022-06-18 HISTORY — PX: CYSTOSCOPY WITH STENT PLACEMENT: SHX5790

## 2022-06-18 LAB — CBC
HCT: 34.6 % — ABNORMAL LOW (ref 39.0–52.0)
Hemoglobin: 11.4 g/dL — ABNORMAL LOW (ref 13.0–17.0)
MCH: 35.8 pg — ABNORMAL HIGH (ref 26.0–34.0)
MCHC: 32.9 g/dL (ref 30.0–36.0)
MCV: 108.8 fL — ABNORMAL HIGH (ref 80.0–100.0)
Platelets: 355 10*3/uL (ref 150–400)
RBC: 3.18 MIL/uL — ABNORMAL LOW (ref 4.22–5.81)
RDW: 16.9 % — ABNORMAL HIGH (ref 11.5–15.5)
WBC: 16.3 10*3/uL — ABNORMAL HIGH (ref 4.0–10.5)
nRBC: 0 % (ref 0.0–0.2)

## 2022-06-18 LAB — URINALYSIS, ROUTINE W REFLEX MICROSCOPIC
Bacteria, UA: NONE SEEN
Bilirubin Urine: NEGATIVE
Glucose, UA: NEGATIVE mg/dL
Hgb urine dipstick: NEGATIVE
Ketones, ur: 5 mg/dL — AB
Leukocytes,Ua: NEGATIVE
Nitrite: NEGATIVE
Protein, ur: 100 mg/dL — AB
Specific Gravity, Urine: 1.018 (ref 1.005–1.030)
Squamous Epithelial / HPF: NONE SEEN (ref 0–5)
pH: 6 (ref 5.0–8.0)

## 2022-06-18 LAB — BASIC METABOLIC PANEL
Anion gap: 9 (ref 5–15)
BUN: 16 mg/dL (ref 8–23)
CO2: 20 mmol/L — ABNORMAL LOW (ref 22–32)
Calcium: 9 mg/dL (ref 8.9–10.3)
Chloride: 101 mmol/L (ref 98–111)
Creatinine, Ser: 1.03 mg/dL (ref 0.61–1.24)
GFR, Estimated: >60 mL/min (ref >60)
Glucose, Bld: 131 mg/dL — ABNORMAL HIGH (ref 70–99)
Potassium: 4.3 mmol/L (ref 3.5–5.1)
Sodium: 130 mmol/L — ABNORMAL LOW (ref 135–145)

## 2022-06-18 LAB — TROPONIN I (HIGH SENSITIVITY)
Troponin I (High Sensitivity): 12 ng/L (ref <18)
Troponin I (High Sensitivity): 17 ng/L (ref <18)

## 2022-06-18 LAB — LACTIC ACID, PLASMA
Lactic Acid, Venous: 2.1 mmol/L (ref 0.5–1.9)
Lactic Acid, Venous: 1 mmol/L (ref 0.5–1.9)

## 2022-06-18 LAB — APTT: aPTT: 42 seconds — ABNORMAL HIGH (ref 24–36)

## 2022-06-18 LAB — TSH: TSH: 0.135 u[IU]/mL — ABNORMAL LOW (ref 0.350–4.500)

## 2022-06-18 LAB — PROTIME-INR
INR: 1.3 — ABNORMAL HIGH (ref 0.8–1.2)
Prothrombin Time: 16.3 seconds — ABNORMAL HIGH (ref 11.4–15.2)

## 2022-06-18 SURGERY — CYSTOSCOPY, WITH STENT INSERTION
Anesthesia: General | Site: Ureter | Laterality: Left

## 2022-06-18 MED ORDER — FENTANYL CITRATE (PF) 100 MCG/2ML IJ SOLN
INTRAMUSCULAR | Status: DC | PRN
  Administered 2022-06-18 (×2): 50 ug via INTRAVENOUS

## 2022-06-18 MED ORDER — IOHEXOL 180 MG/ML  SOLN
INTRAMUSCULAR | Status: DC | PRN
  Administered 2022-06-18: 60 mL

## 2022-06-18 MED ORDER — ATORVASTATIN CALCIUM 20 MG PO TABS: 40.0000 mg | ORAL_TABLET | Freq: Every day | ORAL | Status: DC

## 2022-06-18 MED ORDER — HYDROCODONE-ACETAMINOPHEN 5-325 MG PO TABS: 1.0000 | ORAL_TABLET | ORAL | Status: DC | PRN

## 2022-06-18 MED ORDER — PROPOFOL 10 MG/ML IV BOLUS
INTRAVENOUS | Status: AC
  Filled 2022-06-18: qty 20

## 2022-06-18 MED ORDER — ONDANSETRON HCL 4 MG PO TABS: 4.0000 mg | ORAL_TABLET | Freq: Four times a day (QID) | ORAL | Status: DC | PRN

## 2022-06-18 MED ORDER — SODIUM CHLORIDE 0.9 % IV SOLN: INTRAVENOUS | Status: DC

## 2022-06-18 MED ORDER — ONDANSETRON HCL 4 MG/2ML IJ SOLN: 4.0000 mg | Freq: Four times a day (QID) | INTRAMUSCULAR | Status: DC | PRN

## 2022-06-18 MED ORDER — SODIUM CHLORIDE 0.9 % IR SOLN
Status: DC | PRN
  Administered 2022-06-18: 3000 mL via INTRAVESICAL

## 2022-06-18 MED ORDER — ADULT MULTIVITAMIN W/MINERALS CH
1.0000 | ORAL_TABLET | Freq: Every day | ORAL | Status: DC
  Administered 2022-06-19 – 2022-06-20 (×2): 1 via ORAL
  Filled 2022-06-18 (×2): qty 1

## 2022-06-18 MED ORDER — PANTOPRAZOLE SODIUM 40 MG PO TBEC
40.0000 mg | DELAYED_RELEASE_TABLET | Freq: Every day | ORAL | Status: DC
  Administered 2022-06-19 – 2022-06-20 (×2): 40 mg via ORAL
  Filled 2022-06-18 (×2): qty 1

## 2022-06-18 MED ORDER — FENTANYL CITRATE (PF) 100 MCG/2ML IJ SOLN: 25.0000 ug | INTRAMUSCULAR | Status: DC | PRN

## 2022-06-18 MED ORDER — VITAMIN D 25 MCG (1000 UNIT) PO TABS
2000.0000 [IU] | ORAL_TABLET | Freq: Every evening | ORAL | Status: DC
Start: 2022-06-18 — End: 2022-06-20
  Administered 2022-06-19: 2000 [IU] via ORAL
  Filled 2022-06-18: qty 2

## 2022-06-18 MED ORDER — ATORVASTATIN CALCIUM 20 MG PO TABS
20.0000 mg | ORAL_TABLET | Freq: Every day | ORAL | Status: DC
  Administered 2022-06-19 – 2022-06-20 (×2): 20 mg via ORAL
  Filled 2022-06-18 (×2): qty 1

## 2022-06-18 MED ORDER — PHENYLEPHRINE HCL-NACL 20-0.9 MG/250ML-% IV SOLN
INTRAVENOUS | Status: DC | PRN
  Administered 2022-06-18: 30 ug/min via INTRAVENOUS

## 2022-06-18 MED ORDER — SODIUM CHLORIDE 0.9 % IV BOLUS
500.0000 mL | Freq: Once | INTRAVENOUS | Status: AC
  Administered 2022-06-18: 500 mL via INTRAVENOUS

## 2022-06-18 MED ORDER — PHENYLEPHRINE 80 MCG/ML (10ML) SYRINGE FOR IV PUSH (FOR BLOOD PRESSURE SUPPORT)
PREFILLED_SYRINGE | INTRAVENOUS | Status: DC | PRN
  Administered 2022-06-18 (×2): 80 ug via INTRAVENOUS
  Administered 2022-06-18: 100 ug via INTRAVENOUS

## 2022-06-18 MED ORDER — AMLODIPINE BESYLATE 10 MG PO TABS
10.0000 mg | ORAL_TABLET | Freq: Every day | ORAL | Status: DC
  Administered 2022-06-19: 10 mg via ORAL
  Filled 2022-06-18: qty 1

## 2022-06-18 MED ORDER — PROPOFOL 10 MG/ML IV BOLUS
INTRAVENOUS | Status: DC | PRN
  Administered 2022-06-18: 10 mg via INTRAVENOUS
  Administered 2022-06-18: 120 mg via INTRAVENOUS
  Administered 2022-06-18: 40 mg via INTRAVENOUS

## 2022-06-18 MED ORDER — SUGAMMADEX SODIUM 200 MG/2ML IV SOLN
INTRAVENOUS | Status: DC | PRN
  Administered 2022-06-18: 200 mg via INTRAVENOUS

## 2022-06-18 MED ORDER — METOPROLOL TARTRATE 25 MG PO TABS
12.5000 mg | ORAL_TABLET | Freq: Two times a day (BID) | ORAL | Status: DC
  Administered 2022-06-19: 12.5 mg via ORAL
  Filled 2022-06-18: qty 1

## 2022-06-18 MED ORDER — MORPHINE SULFATE (PF) 2 MG/ML IV SOLN: 2.0000 mg | INTRAVENOUS | Status: DC | PRN

## 2022-06-18 MED ORDER — ONDANSETRON HCL 4 MG/2ML IJ SOLN: 4.0000 mg | Freq: Once | INTRAMUSCULAR | Status: DC | PRN

## 2022-06-18 MED ORDER — LIDOCAINE HCL (CARDIAC) PF 100 MG/5ML IV SOSY
PREFILLED_SYRINGE | INTRAVENOUS | Status: DC | PRN
  Administered 2022-06-18: 100 mg via INTRAVENOUS

## 2022-06-18 MED ORDER — ONDANSETRON HCL 4 MG/2ML IJ SOLN
INTRAMUSCULAR | Status: AC
  Filled 2022-06-18: qty 2

## 2022-06-18 MED ORDER — APIXABAN 5 MG PO TABS: 5.0000 mg | ORAL_TABLET | Freq: Two times a day (BID) | ORAL | Status: DC

## 2022-06-18 MED ORDER — METOPROLOL TARTRATE 5 MG/5ML IV SOLN
5.0000 mg | Freq: Once | INTRAVENOUS | Status: AC
  Administered 2022-06-18: 5 mg via INTRAVENOUS

## 2022-06-18 MED ORDER — SODIUM CHLORIDE 0.9 % IV BOLUS
1000.0000 mL | Freq: Once | INTRAVENOUS | Status: AC
  Administered 2022-06-18: 1000 mL via INTRAVENOUS

## 2022-06-18 MED ORDER — LEVOTHYROXINE SODIUM 150 MCG PO TABS
150.0000 ug | ORAL_TABLET | Freq: Every day | ORAL | Status: DC
  Administered 2022-06-19: 150 ug via ORAL
  Filled 2022-06-18: qty 1

## 2022-06-18 MED ORDER — MAGNESIUM OXIDE -MG SUPPLEMENT 400 (240 MG) MG PO TABS
400.0000 mg | ORAL_TABLET | Freq: Two times a day (BID) | ORAL | Status: DC
  Administered 2022-06-19 – 2022-06-20 (×3): 400 mg via ORAL
  Filled 2022-06-18 (×3): qty 1

## 2022-06-18 MED ORDER — ACETAMINOPHEN 10 MG/ML IV SOLN
INTRAVENOUS | Status: DC | PRN
  Administered 2022-06-18: 1000 mg via INTRAVENOUS

## 2022-06-18 MED ORDER — ONDANSETRON HCL 4 MG/2ML IJ SOLN
INTRAMUSCULAR | Status: DC | PRN
  Administered 2022-06-18: 4 mg via INTRAVENOUS

## 2022-06-18 MED ORDER — LACTATED RINGERS IV SOLN: INTRAVENOUS | Status: DC | PRN

## 2022-06-18 MED ORDER — OCUVITE-LUTEIN PO CAPS
1.0000 | ORAL_CAPSULE | Freq: Two times a day (BID) | ORAL | Status: DC
  Administered 2022-06-19 – 2022-06-20 (×3): 1 via ORAL
  Filled 2022-06-18 (×4): qty 1

## 2022-06-18 MED ORDER — ACETAMINOPHEN 325 MG PO TABS: 650.0000 mg | ORAL_TABLET | Freq: Four times a day (QID) | ORAL | Status: DC | PRN

## 2022-06-18 MED ORDER — ROCURONIUM BROMIDE 100 MG/10ML IV SOLN
INTRAVENOUS | Status: DC | PRN
  Administered 2022-06-18: 20 mg via INTRAVENOUS
  Administered 2022-06-18: 10 mg via INTRAVENOUS

## 2022-06-18 MED ORDER — ACETAMINOPHEN 10 MG/ML IV SOLN
INTRAVENOUS | Status: AC
  Filled 2022-06-18: qty 100

## 2022-06-18 MED ORDER — ORAL CARE MOUTH RINSE: 15.0000 mL | OROMUCOSAL | Status: DC | PRN

## 2022-06-18 MED ORDER — LIDOCAINE HCL (PF) 2 % IJ SOLN
INTRAMUSCULAR | Status: AC
  Filled 2022-06-18: qty 5

## 2022-06-18 MED ORDER — SODIUM CHLORIDE 0.9 % IV SOLN
1.0000 g | Freq: Once | INTRAVENOUS | Status: AC
  Administered 2022-06-18: 1 g via INTRAVENOUS
  Filled 2022-06-18: qty 10

## 2022-06-18 MED ORDER — ACETAMINOPHEN 650 MG RE SUPP: 650.0000 mg | Freq: Four times a day (QID) | RECTAL | Status: DC | PRN

## 2022-06-18 MED ORDER — METOPROLOL TARTRATE 5 MG/5ML IV SOLN
INTRAVENOUS | Status: AC
  Filled 2022-06-18: qty 5

## 2022-06-18 MED ORDER — SUCCINYLCHOLINE CHLORIDE 200 MG/10ML IV SOSY
PREFILLED_SYRINGE | INTRAVENOUS | Status: DC | PRN
  Administered 2022-06-18: 100 mg via INTRAVENOUS

## 2022-06-18 MED ORDER — FENTANYL CITRATE (PF) 100 MCG/2ML IJ SOLN
INTRAMUSCULAR | Status: AC
  Filled 2022-06-18: qty 2

## 2022-06-18 MED ORDER — POTASSIUM CHLORIDE CRYS ER 20 MEQ PO TBCR
40.0000 meq | EXTENDED_RELEASE_TABLET | Freq: Every day | ORAL | Status: DC
  Administered 2022-06-19 – 2022-06-20 (×2): 40 meq via ORAL
  Filled 2022-06-18 (×2): qty 2

## 2022-06-18 MED ORDER — VENLAFAXINE HCL ER 75 MG PO CP24
75.0000 mg | ORAL_CAPSULE | Freq: Every day | ORAL | Status: DC
  Administered 2022-06-19 – 2022-06-20 (×2): 75 mg via ORAL
  Filled 2022-06-18 (×2): qty 1

## 2022-06-18 MED ORDER — SEVOFLURANE IN SOLN
RESPIRATORY_TRACT | Status: AC
  Filled 2022-06-18: qty 250

## 2022-06-18 MED ORDER — PHENYLEPHRINE HCL-NACL 20-0.9 MG/250ML-% IV SOLN
INTRAVENOUS | Status: AC
  Filled 2022-06-18: qty 250

## 2022-06-18 SURGICAL SUPPLY — 21 items
BAG DRAIN CYSTO-URO LG1000N (MISCELLANEOUS) ×2 IMPLANT
BAG URINE DRAIN 2000ML AR STRL (UROLOGICAL SUPPLIES) ×1 IMPLANT
BRUSH SCRUB EZ 1% IODOPHOR (MISCELLANEOUS) ×2 IMPLANT
CATH FOL 3WAY LX 20X30 (CATHETERS) ×1 IMPLANT
CATH URETL OPEN 5X70 (CATHETERS) ×2 IMPLANT
GAUZE 4X4 16PLY ~~LOC~~+RFID DBL (SPONGE) ×4 IMPLANT
GLOVE BIO SURGEON STRL SZ 6.5 (GLOVE) ×3 IMPLANT
GOWN STRL REUS W/ TWL LRG LVL3 (GOWN DISPOSABLE) ×2 IMPLANT
GOWN STRL REUS W/TWL LRG LVL3 (GOWN DISPOSABLE) ×2
GUIDEWIRE STR DUAL SENSOR (WIRE) ×2 IMPLANT
IV NS IRRIG 3000ML ARTHROMATIC (IV SOLUTION) ×2 IMPLANT
KIT TURNOVER CYSTO (KITS) ×2 IMPLANT
PACK CYSTO AR (MISCELLANEOUS) ×2 IMPLANT
SET CYSTO W/LG BORE CLAMP LF (SET/KITS/TRAYS/PACK) ×2 IMPLANT
STENT URET 6FRX24 CONTOUR (STENTS) IMPLANT
STENT URET 6FRX26 CONTOUR (STENTS) ×1 IMPLANT
SURGILUBE 2OZ TUBE FLIPTOP (MISCELLANEOUS) ×2 IMPLANT
SYR TOOMEY IRRIG 70ML (MISCELLANEOUS) ×2
SYRINGE TOOMEY IRRIG 70ML (MISCELLANEOUS) ×1 IMPLANT
WATER STERILE IRR 1000ML POUR (IV SOLUTION) ×2 IMPLANT
WATER STERILE IRR 500ML POUR (IV SOLUTION) ×2 IMPLANT

## 2022-06-18 NOTE — Transfer of Care (Signed)
Immediate Anesthesia Transfer of Care Note  Patient: Eugene White  Procedure(s) Performed: CYSTOSCOPY WITH STENT PLACEMENT (Left: Ureter)  Patient Location: PACU  Anesthesia Type:General  Level of Consciousness: oriented and patient cooperative  Airway & Oxygen Therapy: Patient Spontanous Breathing and Patient connected to face mask oxygen  Post-op Assessment: Report given to RN and Post -op Vital signs reviewed and stable  Post vital signs: Reviewed and stable  Last Vitals:  Vitals Value Taken Time  BP 123/84 06/18/22 2030  Temp 37.6 C 06/18/22 2020  Pulse 132 06/18/22 2032  Resp 13 06/18/22 2032  SpO2 100 % 06/18/22 2032  Vitals shown include unvalidated device data.  Last Pain:  Vitals:   06/18/22 2020  TempSrc:   PainSc: 0-No pain         Complications: No notable events documented.

## 2022-06-18 NOTE — Anesthesia Preprocedure Evaluation (Signed)
Anesthesia Evaluation  Patient identified by MRN, date of birth, ID band Patient awake    Reviewed: Allergy & Precautions, H&P , NPO status , Patient's Chart, lab work & pertinent test results, reviewed documented beta blocker date and time   History of Anesthesia Complications Negative for: history of anesthetic complications  Airway Mallampati: III  TM Distance: >3 FB Neck ROM: full    Dental  (+) Dental Advidsory Given, Poor Dentition   Pulmonary shortness of breath and with exertion, neg sleep apnea, neg COPD, neg recent URI, former smoker,    Pulmonary exam normal breath sounds clear to auscultation       Cardiovascular Exercise Tolerance: Good hypertension, (-) angina(-) Past MI and (-) Cardiac Stents + dysrhythmias Atrial Fibrillation + Valvular Problems/Murmurs  Rhythm:regular Rate:Normal     Neuro/Psych neg Seizures PSYCHIATRIC DISORDERS Depression negative neurological ROS     GI/Hepatic Neg liver ROS, GERD  ,  Endo/Other  neg diabetesHypothyroidism   Renal/GU negative Renal ROS  negative genitourinary   Musculoskeletal   Abdominal   Peds  Hematology negative hematology ROS (+)   Anesthesia Other Findings Past Medical History: 05/31/2009: Adult hypothyroidism No date: Anemia     Comment:  hx of one time No date: Arthritis     Comment:  OA 10/10/2016: Closed intertrochanteric fracture of hip, left, initial  encounter ([place]) 05/31/2009: Colon, diverticulosis No date: Difficulty sleeping 05/31/2009: Diverticulitis large intestine 2014: Diverticulosis No date: Facial nerve injury, birth trauma     Comment:  right side of face droops No date: First degree AV block No date: GERD (gastroesophageal reflux disease) No date: GI symptom     Comment:  had nausea / vomited once / frequent stools / getting               better No date: Hyperlipidemia No date: Hypertension No date: Hypothyroidism No date:  Mild carotid artery disease (HCC) No date: Mitral regurgitation No date: Mitral regurgitation No date: Mood changes No date: PAF (paroxysmal atrial fibrillation) (HCC) No date: Pneumonia     Comment:  hx of No date: Tricuspid regurgitation No date: Tricuspid regurgitation   Reproductive/Obstetrics negative OB ROS                             Anesthesia Physical Anesthesia Plan  ASA: 3 and emergent  Anesthesia Plan: General   Post-op Pain Management:    Induction: Intravenous  PONV Risk Score and Plan: 2 and Ondansetron, Dexamethasone and Treatment may vary due to age or medical condition  Airway Management Planned: LMA  Additional Equipment:   Intra-op Plan:   Post-operative Plan: Extubation in OR  Informed Consent: I have reviewed the patients History and Physical, chart, labs and discussed the procedure including the risks, benefits and alternatives for the proposed anesthesia with the patient or authorized representative who has indicated his/her understanding and acceptance.     Dental Advisory Given  Plan Discussed with: Anesthesiologist, CRNA and Surgeon  Anesthesia Plan Comments:         Anesthesia Quick Evaluation

## 2022-06-18 NOTE — ED Notes (Signed)
MD in room at this time. Pt on pads per md and triage RN

## 2022-06-18 NOTE — Assessment & Plan Note (Addendum)
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

## 2022-06-18 NOTE — ED Triage Notes (Signed)
Pt also has several large discolored bruises on both outer thighs due to multiple falls.

## 2022-06-18 NOTE — Assessment & Plan Note (Addendum)
No acute changes

## 2022-06-18 NOTE — ED Provider Notes (Signed)
[facility] Provider Note    Event Date/Time   First MD Initiated Contact with Patient 06/18/22 1522     (approximate)   History   Flank Pain   HPI  Eugene White is a 76 y.o. male with past medical history of paroxysmal A-fib on Eliquis status post multiple cardioversions in the past, facial nerve palsy, GERD, hypertension hyperlipidemia presents with flank pain.  Since Sunday, over the last 3 days patient has had pain in the left flank radiating around to the left lower abdomen.  Does endorse decreased urination but denies dysuria or hematuria.  Patient also endorses dyspnea on exertion which has been more longstanding.  He had several falls at the beginning of the month and has ecchymosis on bilateral hips.  Still able to ambulate.  Denies chest pain.  Denies nausea vomiting.     Past Medical History:  Diagnosis Date   Adult hypothyroidism 05/31/2009   Anemia    hx of one time   Arthritis    OA   Closed intertrochanteric fracture of hip, left, initial encounter ([place]) 10/10/2016   Colon, diverticulosis 05/31/2009   Difficulty sleeping    Diverticulitis large intestine 05/31/2009   Diverticulosis 2014   Facial nerve injury, 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   First degree AV block    GERD (gastroesophageal reflux disease)    GI symptom    had nausea / vomited once / frequent stools / getting better   Hyperlipidemia    Hypertension    Hypothyroidism    Mild carotid artery disease (HCC)    Mitral regurgitation    Mitral regurgitation    Mood changes    PAF (paroxysmal atrial fibrillation) ([place])    Pneumonia    hx of   Tricuspid regurgitation    Tricuspid regurgitation     Patient Active Problem List   Diagnosis Date Noted   Insomnia 02/14/2022   Impingement syndrome of left shoulder region 02/14/2019   Closed fracture of lateral malleolus 02/01/2019   Overweight (BMI 25.0-29.9) 01/20/2018   Elevated brain natriuretic peptide  (BNP) level 01/11/2018   Mood changes    Hypertension    Hyperlipidemia    GERD (gastroesophageal reflux disease)    Facial nerve injury, birth trauma    Difficulty sleeping    History of removal of retained hardware 10/06/2017   Left femur IM nail removal 10/05/2017   Atrial fibrillation with controlled ventricular rate ([place]) 10/05/2017   Closed intertrochanteric fracture of hip, left, initial encounter ([place]) 10/10/2016   Contracture of palmar fascia (Dupuytren's) 06/27/2015   Gastroesophageal reflux disease with esophagitis 06/27/2015   S/P right TKA 01/09/2015   DJD (degenerative joint disease) of knee 01/09/2015   Arthritis of knee, degenerative 01/09/2015   History of artificial joint 01/09/2015   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/09/2015   Leukocytosis, unspecified 06/02/2014   Abdominal pain, unspecified site 06/02/2014   Anemia 06/02/2014   Hypercholesterolemia without hypertriglyceridemia 05/31/2009   Hypothyroidism 05/31/2009   Colon, diverticulosis 05/31/2009   Major depressive disorder with single episode 05/31/2009   Pure hypercholesterolemia 05/31/2009     Physical Exam  Triage Vital Signs: ED Triage Vitals [06/18/22 1511]  Enc Vitals Group     BP (!) 155/84     Pulse Rate (!) 131     Resp 20     Temp 98.4 F (36.9 C)     Temp Source Oral     SpO2 97 %  Weight 185 lb (83.9 kg)     Height '5\' 10"'$  (1.778 m)     Head Circumference      Peak Flow      Pain Score 6     Pain Loc      Pain Edu?      Excl. in [place]?     Most recent vital signs: Vitals:   06/18/22 1800 06/18/22 1815  BP: (!) 142/93 129/83  Pulse: 96 (!) 130  Resp: 18   Temp:    SpO2: 100% 99%     General: Awake, no distress.  CV:  Good peripheral perfusion.  No peripheral edema Resp:  Normal effort.  Abd:  No distention.  Mild tenderness in left lower quadrant Neuro:             Awake, Alert, Oriented x 3 right-sided facial droop, chronic Other:  Bilateral lateral thighs with  significant ecchymosis that appears to be healing, there is no CVA tenderness, no midline lumbar tenderness although there is ecchymosis just to the right of the lumbar midline   ED Results / Procedures / Treatments  Labs (all labs ordered are listed, but only abnormal results are displayed) Labs Reviewed  URINALYSIS, ROUTINE W REFLEX MICROSCOPIC - Abnormal; Notable for the following components:      Result Value   Color, Urine YELLOW (*)    APPearance CLEAR (*)    Ketones, ur 5 (*)    Protein, ur 100 (*)    All other components within normal limits  BASIC METABOLIC PANEL - Abnormal; Notable for the following components:   Sodium 130 (*)    CO2 20 (*)    Glucose, Bld 131 (*)    All other components within normal limits  CBC - Abnormal; Notable for the following components:   WBC 16.3 (*)    RBC 3.18 (*)    Hemoglobin 11.4 (*)    HCT 34.6 (*)    MCV 108.8 (*)    MCH 35.8 (*)    RDW 16.9 (*)    All other components within normal limits  LACTIC ACID, PLASMA - Abnormal; Notable for the following components:   Lactic Acid, Venous 2.1 (*)    All other components within normal limits  APTT - Abnormal; Notable for the following components:   aPTT 42 (*)    All other components within normal limits  PROTIME-INR - Abnormal; Notable for the following components:   Prothrombin Time 16.3 (*)    INR 1.3 (*)    All other components within normal limits  CULTURE, BLOOD (ROUTINE X 2)  CULTURE, BLOOD (ROUTINE X 2)  URINE CULTURE  LACTIC ACID, PLASMA  TROPONIN I (HIGH SENSITIVITY)  TROPONIN I (HIGH SENSITIVITY)     EKG  EKG shows a wide complex regular tachycardia   RADIOLOGY I reviewed and interpreted the CXR which does not show any acute cardiopulmonary process     PROCEDURES:  Critical Care performed: Yes, see critical care procedure note(s)  .1-3 Lead EKG Interpretation  Performed by: Rada Hay, MD Authorized by: Rada Hay, MD     Interpretation:  abnormal     ECG rate assessment: tachycardic     Rhythm: SVT     Ectopy: none     Conduction: normal   .Critical Care  Performed by: Rada Hay, MD Authorized by: Rada Hay, MD   Critical care provider statement:    Critical care time (minutes):  30   Critical care  was time spent personally by me on the following activities:  Development of treatment plan with patient or surrogate, discussions with consultants, evaluation of patient's response to treatment, examination of patient, ordering and review of laboratory studies, ordering and review of radiographic studies, ordering and performing treatments and interventions, pulse oximetry, re-evaluation of patient's condition and review of old charts   The patient is on the cardiac monitor to evaluate for evidence of arrhythmia and/or significant heart rate changes.   MEDICATIONS ORDERED IN ED: Medications  sodium chloride 0.9 % bolus 500 mL (500 mLs Intravenous Bolus 06/18/22 1554)  cefTRIAXone (ROCEPHIN) 1 g in sodium chloride 0.9 % 100 mL IVPB (0 g Intravenous Stopped 06/18/22 1740)  sodium chloride 0.9 % bolus 1,000 mL (1,000 mLs Intravenous New Bag/Given 06/18/22 1701)     IMPRESSION / MDM / ASSESSMENT AND PLAN / ED COURSE  I reviewed the triage vital signs and the nursing notes.                              Patient's presentation is most consistent with acute presentation with potential threat to life or bodily function.  Differential diagnosis includes, but is not limited to, rib fracture, pyelonephritis, nephrolithiasis, pulmonary embolism  Patient is a 76 year old male today who presents with left flank pain.  This been going on for the last 3 days is located in the left CVA region radiating around to the left abdomen does endorse some difficulty urinating.  Denies fevers or chills.  On arrival patient tachycardic initial EKG showing wide complex regular tachycardia question a flutter with aberrancy.  Patient  does have a history of A-fib in the past.  He did convert to sinus rhythm spontaneously.  I did discuss with Dr. Zenia Resides for those concerned about potential V. tach he felt that this was a supraventricular tachycardia and should just be treated with increase of his metoprolol.  Patient's labs notable for leukocytosis of 16 elevated lactate, creatinine relatively stable.  On exam he does have left CVA tenderness also with ecchymosis to bilateral thighs patient says he had a fall about 3 weeks ago but this is not new and has been able to ambulate.  CT renal study obtained shows severe left-sided hydronephrosis but no obvious obstructing stone question stricture versus radiopaque calculus.  UA has no white cells or red cells but suspect that this is in the setting of complete obstruction.  Patient given dose of Rocephin and 1.5 L of fluid and heart rate did improve and he remained in sinus rhythm without any rate control.   D/w Dr. Erlene Quan due to concern for sepsis with urinary obstruction. She is taking him to the OR tonight. Will d/w medicine for admission.    FINAL CLINICAL IMPRESSION(S) / ED DIAGNOSES   Final diagnoses:  Left flank pain  Sepsis, due to unspecified organism, unspecified whether acute organ dysfunction present [facility])     Rx / DC Orders   ED Discharge Orders     None        Note:  This document was prepared using Dragon voice recognition software and may include unintentional dictation errors.   Rada Hay, MD 06/18/22 Bosie Helper

## 2022-06-18 NOTE — Assessment & Plan Note (Signed)
Continue home levothyroxine and follow-up TSH

## 2022-06-18 NOTE — Assessment & Plan Note (Signed)
Resume home venlafaxine

## 2022-06-18 NOTE — Anesthesia Procedure Notes (Signed)
Procedure Name: Intubation Date/Time: 06/18/2022 7:23 PM  Performed by: Lendon Colonel, CRNAPre-anesthesia Checklist: Patient identified, Patient being monitored, Timeout performed, Emergency Drugs available and Suction available Patient Re-evaluated:Patient Re-evaluated prior to induction Oxygen Delivery Method: Circle system utilized Preoxygenation: Pre-oxygenation with 100% oxygen Induction Type: IV induction Ventilation: Mask ventilation without difficulty Laryngoscope Size: 3 and McGraph Grade View: Grade I Tube type: Oral Tube size: 7.0 mm Number of attempts: 1 Airway Equipment and Method: Stylet Placement Confirmation: ETT inserted through vocal cords under direct vision, positive ETCO2 and breath sounds checked- equal and bilateral Secured at: 22 cm Tube secured with: Tape Dental Injury: Teeth and Oropharynx as per pre-operative assessment

## 2022-06-18 NOTE — Assessment & Plan Note (Addendum)
Heart rate was up to the 130s.  Patient missed his doses of home metoprolol on day of admission HR WNL/rate controlled now  Received metoprolol IV x1 postoperatively   continue with oral metoprolol   Holding home Eliquis due to hematuria.    SCDs for DVT prophylaxis

## 2022-06-18 NOTE — Op Note (Signed)
Date of procedure: 06/18/22  Preoperative diagnosis:  Left hydroureteronephrosis  Postoperative diagnosis:  Left hydroureteronephrosis, severe Left renal collecting system hematoma  Procedure: Cystoscopy Left retrograde pyelogram Evacuation of left renal collecting system hematoma Left ureteral stent placement  Surgeon: [name], MD  Anesthesia: General  Complications: None  Intraoperative findings: Marked deviation of the left distal ureter with what appears to be possible secondary obstruction from tortuosity, proximal massive severe hydroureteronephrosis.  Upon manipulation, there was significant hematuria, motor oil color, thick.  Open-ended ureteral catheter inserted into renal pelvis and greater than 350 cc evacuated from the left renal pelvis.  Ureteral stent placed.  EBL: No acute bleeding  Specimens: Left upper tract urinary culture and cytology  Drains: 6 x 26 French double-J ureteral stent on left, 20 French three-way hematuria catheter with 30 cc balloon  Indication: Eugene White is a 76 y.o. patient with left flank pain since Sunday, new anemia and concern for sepsis in the setting of severe left hydroureteronephrosis without an obvious obstructing stone.  After reviewing the management options for treatment, he elected to proceed with the above surgical procedure(s). We have discussed the potential benefits and risks of the procedure, side effects of the proposed treatment, the likelihood of the patient achieving the goals of the procedure, and any potential problems that might occur during the procedure or recuperation. Informed consent has been obtained.  Description of procedure:  The patient was taken to the operating room and general anesthesia was induced.  The patient was placed in the dorsal lithotomy position, prepped and draped in the usual sterile fashion, and preoperative antibiotics were administered. A preoperative time-out was performed.   A  21 French scope was advanced per urethra into the bladder.  The bladder had moderate trabeculation in the setting of prostamegaly consistent with chronic outlet obstruction but no papillary tumor and no changes suggestive of cystitis.  Attention was turned to the left ureteral orifice.  It was cannulated using a 5 [place] open-ended ureteral catheter.  Gentle retrograde pyelogram initially showed marked lateral deviation of the distal ureter with switch back in the mid proximal ureter with severe hydronephrosis.  The anatomy was distorted.  The obstruction appeared to be high-grade.  I was able to advance a sensor wire into what I felt was the collecting system but due to the deviation, was uncertain.  I advanced the open-ended ureteral catheter over the wire to what was felt to be the renal pelvis and upon removing the wire, there was a prompt hydronephrotic drip that was motor oil in appearance, dark old appearing blood which was thick and copious.  There was no obvious purulence.  I collected a sample of this for cytology as well as urine culture.  I then aspirated greater than 350 cc of this dark hematoma from the open-ended ureteral catheter and significantly decompressed the upper tract but not completely and remained hydronephrotic.  I then injected a little more contrast to more clearly define the anatomy and I was confident at this point that I was in fact in the renal pelvis with the open-ended ureteral catheter.  The ureter also appeared to be more strained at this point after urinary decompression, possibly deviated by the mass effect from the severely distended renal pelvis.  I then replaced the wire into the renal pelvis and remove the open-ended ureteral catheter.  A 6 x 26 French double-J ureteral stent advanced into the renal pelvis.  Upon wire removal, a full coil was noted within  the renal pelvis but only a partial coil within the bladder.  I used stent graspers to retract the stent a little bit  further position and lower in the renal pelvis with a full curl in the bladder.  There was some bloody drainage from the stent at this point.  I elected to go ahead and place a 45 [place] three-way hematuria catheter in case his bleeding or hematuria becomes more severe to help facilitate CBI.  He was then cleaned and dried, repositioned in supine position, reversed from anesthesia, and taken to the PACU in stable condition.  Plan: We will leave the Foley catheter in place overnight.  CBI can be initiated as needed if hematuria becomes severe.  I suspect that this large collecting system hematoma is the source of his significant anemia based on the amount of blood product and acute drop in his hemoglobin.  As previously discussed, he will eventually need diagnostic ureteroscopy down the road as well as a contrasted CT scan to rule out underlying conditions.  Certainly his current presentation likely was exacerbated by trauma and recent falls in the setting of Eliquis.  [name], M.D.

## 2022-06-18 NOTE — ED Notes (Signed)
Lt green, purple, blue sent to lab.

## 2022-06-18 NOTE — Consult Note (Signed)
Urology Consult  I have been asked to see the patient by Dr. Starleen Blue, for evaluation and management of severe left hydronephrosis/sepsis.  Chief Complaint: Left flank pain  History of Present Illness: Eugene White is a 76 y.o. year old male presenting with severe left flank pain and since Sunday as well as low-grade fevers to just over 100 found to have severe left hydroureteronephrosis to the level of the mid ureter on CT scan without an obvious obstructing stone, extensive perinephric stranding, leukocytosis meeting sepsis criteria.  He reports that he has been having some falls recently.  On Sunday, he began having severe left flank pain.  He denied any nausea or vomiting.  He has felt tired fatigued and otherwise unwell.  He denies any urinary symptoms.  Ultimately, he elected to present to the ER.    In the emergency room, noncontrast CT scan showed the above CT scan findings.  He had a leukocytosis to 16 and a mildly elevated lactate to 2.1.  Initially, his tachycardia tachycardic felt to have SVT but returned back into sinus rhythm.  His urinalysis is negative.  He is newly anemic.  He denies a personal history of stones.  Remote history of smoking, quit in the 70s.  He is on chronic Eliquis for history of A-fib.  In the PACU, he is now in A-fib with RVR, heart rate in the 130s.  Past Medical History:  Diagnosis Date   Adult hypothyroidism 05/31/2009   Anemia    hx of one time   Arthritis    OA   Closed intertrochanteric fracture of hip, left, initial encounter ([place]) 10/10/2016   Colon, diverticulosis 05/31/2009   Difficulty sleeping    Diverticulitis large intestine 05/31/2009   Diverticulosis 2014   Facial nerve injury, 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   First degree AV block    GERD (gastroesophageal reflux disease)    GI symptom    had nausea / vomited once / frequent stools / getting better   Hyperlipidemia    Hypertension    Hypothyroidism     Mild carotid artery disease (HCC)    Mitral regurgitation    Mitral regurgitation    Mood changes    PAF (paroxysmal atrial fibrillation) (HCC)    Pneumonia    hx of   Tricuspid regurgitation    Tricuspid regurgitation     Past Surgical History:  Procedure Laterality Date   CARDIOVERSION N/A 01/12/2018   Procedure: CARDIOVERSION;  Surgeon: Dorothy Spark, MD;  Location: [place];  Service: Cardiovascular;  Laterality: N/A;   CARDIOVERSION N/A 01/26/2019   Procedure: CARDIOVERSION;  Surgeon: Jerline Pain, MD;  Location: [facility] ENDOSCOPY;  Service: Cardiovascular;  Laterality: N/A;   CARDIOVERSION N/A 09/30/2019   Procedure: CARDIOVERSION;  Surgeon: Lelon Perla, MD;  Location: [facility] ENDOSCOPY;  Service: Cardiovascular;  Laterality: N/A;   CARDIOVERSION N/A 04/04/2022   Procedure: CARDIOVERSION;  Surgeon: Janina Mayo, MD;  Location: [facility] ENDOSCOPY;  Service: Cardiovascular;  Laterality: N/A;   COLONOSCOPY  2014   FEMUR IM NAIL Left 10/11/2016   Procedure: INTRAMEDULLARY (IM) NAIL FEMORAL;  Surgeon: [name], MD;  Location: [facility] ORS;  Service: Orthopedics;  Laterality: Left;   HAND SURGERY   1973 / 2010 / 2015   right to release tendons   HARDWARE REMOVAL Left 10/05/2017   Procedure: Removal of left femoral nail;  Surgeon: [name], MD;  Location: [facility]S;  Service: Orthopedics;  Laterality: Left;  90 mins   HEMATOMA EVACUATION Left 01/19/2018   Procedure: Irrigation and debridement, Evacuation of left total knee hematoma;  Surgeon: [name], MD;  Location: [facility];  Service: Orthopedics;  Laterality: Left;   KNEE ARTHROSCOPY  1984 & 2001   twice   NASAL SEPTUM SURGERY     removal Left femoral nail      10/05/17 Dr. [name]   SHOULDER OPEN ROTATOR CUFF REPAIR  1999 & 2006   Right and left   TEE WITH CARDIOVERSION  01/12/2018   TEE WITHOUT CARDIOVERSION N/A 01/12/2018   Procedure: TRANSESOPHAGEAL ECHOCARDIOGRAM (TEE);  Surgeon: Dorothy Spark, MD;  Location: [facility]  ENDOSCOPY;  Service: Cardiovascular;  Laterality: N/A;   TONSILLECTOMY     TOTAL KNEE ARTHROPLASTY Right 01/09/2015   Procedure: RIGHT TOTAL KNEE ARTHROPLASTY;  Surgeon: [name], MD;  Location: [facility];  Service: Orthopedics;  Laterality: Right;   TOTAL KNEE ARTHROPLASTY Left 11/23/2017   Procedure: LEFT TOTAL KNEE ARTHROPLASTY;  Surgeon: [name], MD;  Location: [facility]S;  Service: Orthopedics;  Laterality: Left;  90 mins    Home Medications:  No outpatient medications have been marked as taking for the 06/18/22 encounter [facility] Encounter).    Allergies:  Allergies  Allergen Reactions   Sulfa Antibiotics Other (See Comments)    Unknown/childhood allergy unknown    Family History  Problem Relation Age of Onset   Breast cancer Mother    Colon cancer Neg Hx    Esophageal cancer Neg Hx    Stomach cancer Neg Hx    Rectal cancer Neg Hx     Social History:  reports that he quit smoking about 53 years ago. His smoking use included cigarettes. He has never used smokeless tobacco. He reports current alcohol use of about 12.0 standard drinks of alcohol per week. He reports that he does not use drugs.  ROS: A complete review of systems was performed.  All systems are negative except for pertinent findings as noted.  Physical Exam:  Vital signs in last 24 hours: Temp:  [98.4 F (36.9 C)-100 F (37.8 C)] 100 F (37.8 C) (06/21 1845) Pulse Rate:  [86-135] 135 (06/21 1845) Resp:  [16-20] 18 (06/21 1800) BP: (122-155)/(74-93) 148/92 (06/21 1845) SpO2:  [96 %-100 %] 96 % (06/21 1845) Weight:  [83.9 kg] 83.9 kg (06/21 1511) Constitutional:  Alert and oriented, No acute distress.  Accompanied by wife today. HEENT: [place] AT, moist mucus membranes.  Trachea midline, no masses Cardiovascular: Regular rate and rhythm, no clubbing, cyanosis, or edema. Respiratory: Normal respiratory effort, lungs clear bilaterally GI: Abdomen is soft, nontender, nondistended, no abdominal masses GU:  + left flank pain MSK: Multiple hematomas including right lower extremity Neurologic: Grossly intact, no focal deficits, moving all 4 extremities Psychiatric: Normal mood and affect   Laboratory Data:  Recent Labs    06/18/22 1522  WBC 16.3*  HGB 11.4*  HCT 34.6*   Recent Labs    06/18/22 1522  NA 130*  K 4.3  CL 101  CO2 20*  GLUCOSE 131*  BUN 16  CREATININE 1.03  CALCIUM 9.0   Recent Labs    06/18/22 1524  INR 1.3*   Urinalysis is negative    Radiologic Imaging: DG OR UROLOGY CYSTO IMAGE ([facility] ONLY)  Result Date: 06/18/2022 There is no interpretation for this exam.  This order is for images obtained during a surgical procedure.  Please See "Surgeries" Tab for more information regarding  the procedure.   CT Renal Stone Study  Result Date: 06/18/2022 CLINICAL DATA:  Flank pain, kidney stones suspected EXAM: CT ABDOMEN AND PELVIS WITHOUT CONTRAST TECHNIQUE: Multidetector CT imaging of the abdomen and pelvis was performed following the standard protocol without IV contrast. RADIATION DOSE REDUCTION: This exam was performed according to the departmental dose-optimization program which includes automated exposure control, adjustment of the mA and/or kV according to patient size and/or use of iterative reconstruction technique. COMPARISON:  06/05/2014 FINDINGS: Lower chest: No acute abnormality. Moderate hiatal hernia with intrathoracic position of the gastric fundus. Hepatobiliary: No solid liver abnormality is seen. Multiple fluid attenuation simple cysts seen throughout the liver, similar in appearance to prior examination dated 2015. No gallstones, gallbladder wall thickening, or biliary dilatation. Pancreas: Unremarkable. No pancreatic ductal dilatation or surrounding inflammatory changes. Spleen: Normal in size without significant abnormality. Adrenals/Urinary Tract: Adrenal glands are unremarkable. Very severe left hydronephrosis and proximal hydroureter with extensive  left-sided perinephric fat stranding. There is an abrupt caliber change of the mid left ureter (series 2, image 48) without calculus or other obstructing etiology identified. Bladder is unremarkable. Stomach/Bowel: Stomach is within normal limits. Appendix appears normal. No evidence of bowel wall thickening, distention, or inflammatory changes. Descending and sigmoid diverticulosis. Vascular/Lymphatic: Aortic atherosclerosis. No enlarged abdominal or pelvic lymph nodes. Reproductive: Mild prostatomegaly. Other: No abdominal wall hernia or abnormality. No ascites. Musculoskeletal: No acute or significant osseous findings. Partially imaged subcutaneous soft tissue lesions overlying the tensor fascia lata bilaterally (series 2, image 81). IMPRESSION: 1. Very severe left hydronephrosis and proximal hydroureter with extensive left-sided perinephric fat stranding. There is an abrupt caliber change of the mid left ureter without calculus or other obstructing etiology identified. This may reflect a ureteral stricture or a non radiopaque calculus. No right-sided calculi or hydronephrosis. 2. Moderate hiatal hernia with intrathoracic position of the gastric fundus. 3. Descending and sigmoid diverticulosis without evidence of acute diverticulitis. 4. Mild prostatomegaly. 5. Partially imaged subcutaneous soft tissue lesions overlying the tensor fascia lata bilaterally. These are of uncertain nature, possibly postoperative or posttraumatic. Aortic Atherosclerosis (ICD10-I70.0). Electronically Signed   By: Delanna Ahmadi M.D.   On: 06/18/2022 16:29   DG Chest Portable 1 View  Result Date: 06/18/2022 CLINICAL DATA:  Shortness of breath EXAM: PORTABLE CHEST 1 VIEW COMPARISON:  Chest x-ray dated February 25, 2022 FINDINGS: Visualized cardiac and mediastinal contours are within normal limits. Lungs are clear. No evidence of pleural effusion or pneumothorax. IMPRESSION: No acute cardiopulmonary abnormality. Electronically Signed    By: Yetta Glassman M.D.   On: 06/18/2022 16:07    CT scan was personally reviewed.  Hydronephrosis is severe and appears chronic based on the presence of parenchymal thinning.  Etiology is unclear in this noncontrasted study.  There is no obstructing stone.  In comparison to 2015, this is marked change.  Extensive perinephric stranding.  Impression/ Plan:  1.  Severe left hydroureteronephrosis to the mid ureter/sepsis/left flank pain  Etiology of obstruction is unclear but there is concern for superimposed infection as a source of his current sepsis.  Suspect urinalysis is negative if there is a complete obstruction.  Differential diagnosis for the obstruction includes hematoma, urinary debris, extrinsic compression although not clearly noted on this exam, non-radiopaque stone, ureteral stricture or possibly even malignancy.  Plan for emergent left ureteral stent placement for urinary decompression in the setting of sepsis for maximal urinary decompression.  May elect to leave a Foley catheter if there is purulent return.  We discussed  that we will ultimately need to return likely as an outpatient for staged procedure for diagnostic ureteroscopy once he is recovered from this acute illness.  He will be admitted to the hospitalist service for continued supportive care.  He is already received IV antibiotics.  2.  Anemia  New finding which should be evaluated.  No blood in the urine.  Case was discussed with the ER attending physician, Dr. Starleen Blue.  Additionally, I did speak with the patient's wife personally at the bedside as well as daughter by telephone who is a Sports administrator in English.  06/18/2022, 6:59 PM  [name],  MD

## 2022-06-18 NOTE — H&P (Signed)
History and Physical    Patient: Eugene White ITG:[id number] DOB: [birth year] DOA: 06/18/2022 DOS: the patient was seen and examined on 06/18/2022 PCP: [name], PA-C  Patient coming from: Home  Chief Complaint:  Chief Complaint  Patient presents with   Flank Pain   HPI: Eugene White is a 76 y.o. 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  He denies fever or chills or nausea or vomiting. ED course and data review: On arrival he was found to be in rapid A-fib with rate in the 130s and otherwise unremarkable vitals. Blood work revealed WBC of 16,000 with a lactic acid of 2.1.  Hemoglobin 11.4, down from baseline of 15.6.  Sodium 130.  Troponin 12.  Urinalysis unremarkable. EKG initially showed A-fib CT renal stone study showed left severe hydronephrosis and proximal hydroureter with extensive left-sided perinephric fat stranding. The ED provider spoke with urologist Dr. [name] who took patient to the OR where he underwent placement of a left ureteral stent following which she started having bloody urinary drainage. Following surgery, hospitalist was consulted for admission. Patient was started from the ED on ceftriaxone for possible UTI.  Review of Systems: As mentioned in the history of present illness. All other systems reviewed and are negative. Past Medical History:  Diagnosis Date   Adult hypothyroidism 05/31/2009   Anemia    hx of one time   Arthritis    OA   Closed intertrochanteric fracture of hip, left, initial encounter ([place]) 10/10/2016   Colon, diverticulosis 05/31/2009   Difficulty sleeping    Diverticulitis large intestine 05/31/2009   Diverticulosis 2014   Facial nerve injury, 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   First degree AV  block    GERD (gastroesophageal reflux disease)    GI symptom    had nausea / vomited once / frequent stools / getting better   Hyperlipidemia    Hypertension    Hypothyroidism    Mild carotid artery disease (HCC)    Mitral regurgitation    Mitral regurgitation    Mood changes    PAF (paroxysmal atrial fibrillation) (HCC)    Pneumonia    hx of   Tricuspid regurgitation    Tricuspid regurgitation    Past Surgical History:  Procedure Laterality Date   CARDIOVERSION N/A 01/12/2018   Procedure: CARDIOVERSION;  Surgeon: Dorothy Spark, MD;  Location: [place];  Service: Cardiovascular;  Laterality: N/A;   CARDIOVERSION N/A 01/26/2019   Procedure: CARDIOVERSION;  Surgeon: Jerline Pain, MD;  Location: [facility] ENDOSCOPY;  Service: Cardiovascular;  Laterality: N/A;   CARDIOVERSION N/A 09/30/2019   Procedure: CARDIOVERSION;  Surgeon: Lelon Perla, MD;  Location: [facility] ENDOSCOPY;  Service: Cardiovascular;  Laterality: N/A;   CARDIOVERSION N/A 04/04/2022   Procedure: CARDIOVERSION;  Surgeon: Janina Mayo, MD;  Location: [facility] ENDOSCOPY;  Service: Cardiovascular;  Laterality: N/A;   COLONOSCOPY  2014   FEMUR IM NAIL Left 10/11/2016   Procedure: INTRAMEDULLARY (IM) NAIL FEMORAL;  Surgeon: [name], MD;  Location: [facility] ORS;  Service: Orthopedics;  Laterality: Left;   HAND SURGERY   1973 / 2010 / 2015   right to release tendons   HARDWARE REMOVAL Left 10/05/2017   Procedure: Removal of left femoral nail;  Surgeon: [name], MD;  [facility];  Service: Orthopedics;  Laterality: Left;  90 mins   HEMATOMA EVACUATION Left 01/19/2018   Procedure: Irrigation and debridement, Evacuation of left total knee hematoma;  Surgeon: [name], MD;  Location: [facility];  Service: Orthopedics;  Laterality: Left;   KNEE ARTHROSCOPY  1984 & 2001   twice   NASAL SEPTUM SURGERY     removal Left femoral nail      10/05/17 Dr. [name]   SHOULDER OPEN ROTATOR CUFF REPAIR  1999 & 2006   Right and left    TEE WITH CARDIOVERSION  01/12/2018   TEE WITHOUT CARDIOVERSION N/A 01/12/2018   Procedure: TRANSESOPHAGEAL ECHOCARDIOGRAM (TEE);  Surgeon: Dorothy Spark, MD;  Location: [facility] ENDOSCOPY;  Service: Cardiovascular;  Laterality: N/A;   TONSILLECTOMY     TOTAL KNEE ARTHROPLASTY Right 01/09/2015   Procedure: RIGHT TOTAL KNEE ARTHROPLASTY;  Surgeon: [name], MD;  [facility];  Service: Orthopedics;  Laterality: Right;   TOTAL KNEE ARTHROPLASTY Left 11/23/2017   Procedure: LEFT TOTAL KNEE ARTHROPLASTY;  Surgeon: [name], MD;  Location: [facility];  Service: Orthopedics;  Laterality: Left;  90 mins   Social History:  reports that he quit smoking about 53 years ago. His smoking use included cigarettes. He has never used smokeless tobacco. He reports current alcohol use of about 12.0 standard drinks of alcohol per week. He reports that he does not use drugs.  Allergies  Allergen Reactions   Sulfa Antibiotics Other (See Comments)    Unknown/childhood allergy unknown    Family History  Problem Relation Age of Onset   Breast cancer Mother    Colon cancer Neg Hx    Esophageal cancer Neg Hx    Stomach cancer Neg Hx    Rectal cancer Neg Hx     Prior to Admission medications   Medication Sig Start Date End Date Taking? Authorizing Provider  amLODipine (NORVASC) 10 MG tablet Take 1 tablet (10 mg total) by mouth daily. 10/25/21   Allred, Jeneen Rinks, MD  amoxicillin (AMOXIL) 500 MG capsule Take 4 capsules by mouth as directed. Before dental appointment 12/11/21   [provider]  apixaban (ELIQUIS) 5 MG TABS tablet Take 1 tablet (5 mg total) by mouth 2 (two) times daily. Due for follow-up with MD, Will need office visit for FUTURE refills. 09/05/21   Freada Bergeron, MD  atorvastatin (LIPITOR) 20 MG tablet Take 1 tablet (20 mg total) by mouth daily. 05/29/22   [name], PA-C  atorvastatin (LIPITOR) 40 MG tablet Take 40 mg by mouth daily. 03/12/22   [provider]   Cholecalciferol (VITAMIN D) 2000 UNITS CAPS Take 2,000 Units by mouth every evening.     [provider]  diphenhydramine-acetaminophen (TYLENOL PM) 25-500 MG TABS tablet Take 4 tablets by mouth at bedtime as needed (sleep).    [provider]  ibuprofen (ADVIL) 200 MG tablet Take 800 mg by mouth daily as needed for moderate pain.    [provider]  levothyroxine (SYNTHROID) 150 MCG tablet Take 1 tablet (150 mcg total) by mouth daily. 04/11/22   [name], PA-C  magnesium oxide (MAG-OX) 400 (240 Mg) MG tablet TAKE 1 TABLET TWICE DAILY . 03/04/22   Freada Bergeron, MD  Menthol, Topical Analgesic, (BLUE-EMU MAXIMUM STRENGTH EX) Apply 1 application topically at bedtime as needed (pain).    [provider]  metoprolol tartrate (LOPRESSOR) 25 MG tablet Take 0.5 tablets (12.5 mg total) by mouth 2 (two) times daily. 03/13/22   Gwyndolyn Kaufman  E, MD  Multiple Vitamin (MULTIVITAMIN WITH MINERALS) TABS tablet Take 1 tablet by mouth daily.     [provider]  Multiple Vitamins-Minerals (PRESERVISION AREDS 2) CAPS Take 1 capsule by mouth 2 (two) times daily.     [provider]  pantoprazole (PROTONIX) 40 MG tablet Take 1 tablet (40 mg total) by mouth daily. 04/18/22   [name], PA-C  potassium chloride SA (KLOR-CON M20) 20 MEQ tablet Take 2 tablets (40 mEq total) by mouth daily. 01/30/22   Freada Bergeron, MD  venlafaxine XR (EFFEXOR-XR) 75 MG 24 hr capsule TAKE 1 CAPSULE EVERY DAY (NEED MD APPOINTMENT FOR REFILLS) 03/21/22   [name], PA-C    Physical Exam: Vitals:   06/18/22 2158 06/18/22 2200 06/18/22 2213 06/18/22 2215  BP:  112/75  108/74  Pulse: 98 98 78 80  Resp: '14 16 17 14  '$ Temp:  (!) 97.4 F (36.3 C)    TempSrc:      SpO2: 98% 99% 99% 99%  Weight:      Height:       Physical Exam Vitals and nursing note reviewed.  Constitutional:      General: He is not in acute distress. HENT:     Head: Normocephalic  and atraumatic.  Cardiovascular:     Rate and Rhythm: Tachycardia present. Rhythm irregular.     Heart sounds: Normal heart sounds.  Pulmonary:     Effort: Pulmonary effort is normal.     Breath sounds: Normal breath sounds.  Abdominal:     Palpations: Abdomen is soft.     Tenderness: There is no abdominal tenderness.  Neurological:     Mental Status: Mental status is at baseline.     Data Reviewed: Relevant notes from primary care and specialist visits, past discharge summaries as available in EHR, including Care Everywhere. Prior diagnostic testing as pertinent to current admission diagnoses Updated medications and problem lists for reconciliation ED course, including vitals, labs, imaging, treatment and response to treatment Triage notes, nursing and pharmacy notes and ED provider's notes Notable results as noted in HPI   Assessment and Plan: * Hydroureteronephrosis of left kidney Hematuria Status post ureteral stent placement on 6/21 Currently has Foley draining dark red urine Continued management per urology   Atrial fibrillation with rapid ventricular response (HCC) Heart rate was up to the 130s.  Patient missed his doses of home metoprolol today Received metoprolol IV x1 postoperatively and will continue with oral metoprolol thereafter Holding home Eliquis due to hematuria.  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 Inpatient status is judged to be reasonable and necessary in order to provide the required intensity of service to ensure the patient's safety. The patient's presenting symptoms, physical exam findings, and initial radiographic and laboratory data in the context of their chronic comorbidities is felt to place them at high risk for further clinical deterioration. Furthermore, it is not anticipated that the patient will be  medically stable for discharge from the hospital within 2 midnights of admission.   * I certify that at the point of admission it is my clinical judgment that the patient will require inpatient hospital care spanning beyond 2 midnights from the point of admission due to high intensity of service, high risk for further deterioration and high frequency of surveillance required.*  Author: Athena Masse, MD 06/18/2022 10:36 PM  For on call review [email or web address].

## 2022-06-18 NOTE — Telephone Encounter (Signed)
Copied from [place] [id number]. Topic: Medicare AWV >> Jun 18, 2022  3:41 PM Leilani Able wrote: Reason for CRM: FYI pt wiofe called in to cancel an acute appt for tomorrow as pt in hospital,  states having some real problems.  As cancelling noticed also Tele AWV/ I am sure she would want cancelled [place]

## 2022-06-18 NOTE — Progress Notes (Signed)
Called Dr Rosey Bath and advised him that patients heart rate was high and all over the place due to his A-FIB and he suggested that the admitting Dr. Aletha Halim manage the rate once they see him.

## 2022-06-18 NOTE — ED Notes (Signed)
Pt to OR now

## 2022-06-18 NOTE — Assessment & Plan Note (Addendum)
Hematuria Status post ureteral stent placement on 6/21 Currently has Foley draining dark red urine Continued management per urology

## 2022-06-18 NOTE — ED Triage Notes (Signed)
Pt here with left flank pain. Pt states he had a 101 temp last night. Pt states the pain is now in his groin area. Pt denies N/V/D. Pt tachycardic in triage at 134.

## 2022-06-18 NOTE — Assessment & Plan Note (Signed)
Continue home amlodipine and metoprolol 

## 2022-06-19 ENCOUNTER — Ambulatory Visit: Payer: Medicare HMO | Admitting: Physician Assistant

## 2022-06-19 ENCOUNTER — Encounter: Payer: Self-pay | Admitting: Urology

## 2022-06-19 ENCOUNTER — Other Ambulatory Visit: Payer: Self-pay | Admitting: Physician Assistant

## 2022-06-19 ENCOUNTER — Ambulatory Visit: Payer: Medicare HMO | Admitting: Nurse Practitioner

## 2022-06-19 DIAGNOSIS — R310 Gross hematuria: Secondary | ICD-10-CM

## 2022-06-19 DIAGNOSIS — N1330 Unspecified hydronephrosis: Secondary | ICD-10-CM | POA: Diagnosis not present

## 2022-06-19 DIAGNOSIS — R000 Tachycardia, unspecified: Secondary | ICD-10-CM | POA: Insufficient documentation

## 2022-06-19 DIAGNOSIS — D649 Anemia, unspecified: Secondary | ICD-10-CM | POA: Diagnosis not present

## 2022-06-19 DIAGNOSIS — R109 Unspecified abdominal pain: Secondary | ICD-10-CM | POA: Diagnosis not present

## 2022-06-19 DIAGNOSIS — A419 Sepsis, unspecified organism: Secondary | ICD-10-CM | POA: Diagnosis not present

## 2022-06-19 LAB — BASIC METABOLIC PANEL
Anion gap: 5 (ref 5–15)
Anion gap: 8 (ref 5–15)
BUN: 15 mg/dL (ref 8–23)
BUN: 15 mg/dL (ref 8–23)
CO2: 23 mmol/L (ref 22–32)
CO2: 20 mmol/L — ABNORMAL LOW (ref 22–32)
Calcium: 8.5 mg/dL — ABNORMAL LOW (ref 8.9–10.3)
Calcium: 8.4 mg/dL — ABNORMAL LOW (ref 8.9–10.3)
Chloride: 106 mmol/L (ref 98–111)
Chloride: 103 mmol/L (ref 98–111)
Creatinine, Ser: 0.71 mg/dL (ref 0.61–1.24)
Creatinine, Ser: 0.8 mg/dL (ref 0.61–1.24)
GFR, Estimated: >60 mL/min (ref >60)
GFR, Estimated: >60 mL/min (ref >60)
Glucose, Bld: 106 mg/dL — ABNORMAL HIGH (ref 70–99)
Glucose, Bld: 108 mg/dL — ABNORMAL HIGH (ref 70–99)
Potassium: 3.8 mmol/L (ref 3.5–5.1)
Potassium: 4.1 mmol/L (ref 3.5–5.1)
Sodium: 134 mmol/L — ABNORMAL LOW (ref 135–145)
Sodium: 131 mmol/L — ABNORMAL LOW (ref 135–145)

## 2022-06-19 LAB — CBC
HCT: 28.4 % — ABNORMAL LOW (ref 39.0–52.0)
HCT: 26.4 % — ABNORMAL LOW (ref 39.0–52.0)
Hemoglobin: 9.2 g/dL — ABNORMAL LOW (ref 13.0–17.0)
Hemoglobin: 8.6 g/dL — ABNORMAL LOW (ref 13.0–17.0)
MCH: 36.1 pg — ABNORMAL HIGH (ref 26.0–34.0)
MCH: 36 pg — ABNORMAL HIGH (ref 26.0–34.0)
MCHC: 32.4 g/dL (ref 30.0–36.0)
MCHC: 32.6 g/dL (ref 30.0–36.0)
MCV: 111.4 fL — ABNORMAL HIGH (ref 80.0–100.0)
MCV: 110.5 fL — ABNORMAL HIGH (ref 80.0–100.0)
Platelets: 248 10*3/uL (ref 150–400)
Platelets: 247 10*3/uL (ref 150–400)
RBC: 2.55 MIL/uL — ABNORMAL LOW (ref 4.22–5.81)
RBC: 2.39 MIL/uL — ABNORMAL LOW (ref 4.22–5.81)
RDW: 16.6 % — ABNORMAL HIGH (ref 11.5–15.5)
RDW: 16 % — ABNORMAL HIGH (ref 11.5–15.5)
WBC: 10.8 10*3/uL — ABNORMAL HIGH (ref 4.0–10.5)
WBC: 9.6 10*3/uL (ref 4.0–10.5)
nRBC: 0 % (ref 0.0–0.2)
nRBC: 0 % (ref 0.0–0.2)

## 2022-06-19 LAB — URINE CULTURE: Culture: NO GROWTH

## 2022-06-19 LAB — HEMOGLOBIN AND HEMATOCRIT, BLOOD
HCT: 25.4 % — ABNORMAL LOW (ref 39.0–52.0)
Hemoglobin: 8.1 g/dL — ABNORMAL LOW (ref 13.0–17.0)

## 2022-06-19 LAB — T4, FREE: Free T4: 1.97 ng/dL — ABNORMAL HIGH (ref 0.61–1.12)

## 2022-06-19 MED ORDER — SODIUM CHLORIDE 0.9 % IV BOLUS
500.0000 mL | Freq: Once | INTRAVENOUS | Status: AC
Start: 2022-06-19 — End: 2022-06-19
  Administered 2022-06-19: 500 mL via INTRAVENOUS

## 2022-06-19 MED ORDER — METOPROLOL TARTRATE 5 MG/5ML IV SOLN
5.0000 mg | Freq: Once | INTRAVENOUS | Status: DC | PRN
Start: 2022-06-19 — End: 2022-06-20

## 2022-06-19 MED ORDER — CHLORHEXIDINE GLUCONATE CLOTH 2 % EX PADS
6.0000 | MEDICATED_PAD | Freq: Every day | CUTANEOUS | Status: DC
  Administered 2022-06-19 – 2022-06-20 (×2): 6 via TOPICAL

## 2022-06-19 MED ORDER — METOPROLOL TARTRATE 25 MG PO TABS
25.0000 mg | ORAL_TABLET | Freq: Two times a day (BID) | ORAL | Status: DC
  Administered 2022-06-19 – 2022-06-20 (×2): 25 mg via ORAL
  Filled 2022-06-19 (×2): qty 1

## 2022-06-19 MED ORDER — METOPROLOL TARTRATE 25 MG PO TABS
12.5000 mg | ORAL_TABLET | Freq: Once | ORAL | Status: AC
  Administered 2022-06-19: 12.5 mg via ORAL
  Filled 2022-06-19: qty 1

## 2022-06-19 NOTE — Progress Notes (Signed)
PROGRESS NOTE    [name]  GNF:[id number] DOB: 1946-06-27  DOA: 06/18/2022 Date of Service: 06/19/22 PCP: [name], PA-C     Brief Narrative / Hospital Course:  Eugene White is a 76 y.o. 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   06/21: ED rapid A-fib with rate in the 130s and otherwise unremarkable vitals. WBC of 16,000 with a lactic acid of 2.1.  Hemoglobin 11.4, down from baseline of 15.6.  Sodium 130.  Troponin 12.  Urinalysis unremarkable. EKG showed A-fib. CT renal stone study showed left severe hydronephrosis and proximal hydroureter with extensive left-sided perinephric fat stranding. The ED provider spoke with urologist Dr. [name] who took patient to the OR where he underwent placement of a left ureteral stent following which she started having bloody urinary drainage. Following surgery, hospitalist was consulted for admission. Patient was started from the ED on ceftriaxone for possible UTI. Eliquis held. Monitoring H/H and VS.  06/22: heart rate controlled in AM, Hgb decreased but not critical.  Later in the morning afternoon, heart rate was up and down, worse with activity but stable sinus. 5:30 PM/6:00 PM, was remaining sinus tachycardia, no symptoms, RN checked Foley and no apparent increased bleeding.  Ordered stat CBC, BMP.  Increase p.o. metoprolol added extra dose, stopped amlodipine, BP soft but not severely hypotensive, ordered 500 mL NS bolus, if tachycardia persists will start IV medications, with coverage.  Await labs. Also abnormal TSH concerning for hyperthyroid may be contributing factor, T4 pending, Synthroid held   Consultants:  Urology  Procedures: 06/18/22: Cystoscopy, Left retrograde pyelogram, Evacuation of left renal  collecting system hematoma, Left ureteral stent placement    Subjective: Patient reports this morning that he was feeling pretty well, no dizziness/lightheadedness, no significant weakness, no pain.     ASSESSMENT & PLAN:   Principal Problem:   Hydroureteronephrosis of left kidney Active Problems:   Atrial fibrillation with rapid ventricular response (HCC)   Sepsis (HCC)   ABLA (acute blood loss anemia)   Adult hypothyroidism   Clinical depression   Essential (primary) hypertension   Facial nerve injury, birth trauma   Sinus tachycardia  Hydroureteronephrosis of left kidney Hematuria Status post ureteral stent placement on 6/21 Currently has Foley draining dark red urine Continued management per urology  Sinus tachycardia 06/19/22 5:30 PM/6:00 PM, was remaining sinus tachycardia, no symptoms, earlier in the day has been tachycardic with activity only but had resolved with rest.  Patient reporting no symptoms, particularly no palpitations, lightheadedness, chest pain. EKG ordered to confirm sinus.   RN checked Foley and no apparent increased bleeding.   Ordered stat CBC, BMP.   Increase p.o. metoprolol added extra dose, stopped amlodipine Held Synthroid, TSH high  BP soft but not severely hypotensive ordered 500 mL NS bolus if tachycardia persists will start IV medications,  Sign out to night coverage but I will be here until 7PM.  Atrial fibrillation with rapid ventricular response (HCC) Heart rate was up to the 130s.  Patient missed his doses of home metoprolol on day of admission HR back to sinus then tachycardia - see above Received metoprolol IV x1 postoperatively  continue with oral metoprolol  Holding home Eliquis due to hematuria.   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, L renal hematoma evacuated 06/18/22 - etiology to  be determined by urology Urology following Monitor CBC Holding home Eliquis which he takes for Afib    Adult hypothyroidism Continue home levothyroxine and follow-up TSH  Clinical depression Resume home venlafaxine  Essential (primary) hypertension Continue home amlodipine and metoprolol  Facial nerve injury, birth trauma No acute changes  Sepsis ([place]) RESOLVED as of 06/19/22:  VS WNL, WBC and lactic acid trending down      DVT prophylaxis: SCD Code Status: FULL Family Communication: none at this time   Disposition Plan / TOC needs: home when stable, may need HH Barriers to discharge / significant pending items: appreciate urology recs re: discharge readiness/goals, but also remains inpatient as we are following urine cultures and H/H so perhaps can be discharged in 1-2 days             Objective: Vitals:   06/19/22 0757 06/19/22 1156 06/19/22 1552 06/19/22 1703  BP: 116/69 111/74 118/87 116/71  Pulse: (!) 107 78 (!) 129 96  Resp: '18 18  20  '$ Temp: 98.1 F (36.7 C) 98.9 F (37.2 C) 98.7 F (37.1 C) 99 F (37.2 C)  TempSrc: Oral Oral Oral Oral  SpO2: 100% 99% 97% 98%  Weight:      Height:        Intake/Output Summary (Last 24 hours) at 06/19/2022 1809 Last data filed at 06/19/2022 1746 Gross per 24 hour  Intake 1449.03 ml  Output 4600 ml  Net -3150.97 ml   Filed Weights   06/18/22 1511 06/18/22 2249  Weight: 83.9 kg 83.6 kg    Examination:  Constitutional:  VS as above General Appearance: alert, well-developed, well-nourished, NAD Neck: No masses, trachea midline Respiratory: Normal respiratory effort Breath sounds normal, no wheeze/rhonchi/rales Cardiovascular: S1/S2 normal, no murmur/rub/gallop auscultated No lower extremity edema Gastrointestinal: Nontender, no masses Musculoskeletal:  No clubbing/cyanosis of digits Psychiatric: Normal judgment/insight Normal mood and affect       Scheduled Medications:   atorvastatin  20  mg Oral Daily   cholecalciferol  2,000 Units Oral QPM   magnesium oxide  400 mg Oral BID   metoprolol tartrate  12.5 mg Oral Once   metoprolol tartrate  25 mg Oral BID   multivitamin with minerals  1 tablet Oral Daily   multivitamin-lutein  1 capsule Oral BID   pantoprazole  40 mg Oral Daily   potassium chloride SA  40 mEq Oral Daily   venlafaxine XR  75 mg Oral Q breakfast    Continuous Infusions:  sodium chloride 100 mL/hr at 06/19/22 0846   sodium chloride      PRN Medications:  acetaminophen **OR** acetaminophen, HYDROcodone-acetaminophen, metoprolol tartrate, morphine injection, ondansetron **OR** ondansetron (ZOFRAN) IV, mouth rinse  Antimicrobials:  Anti-infectives (From admission, onward)    Start     Dose/Rate Route Frequency Ordered Stop   06/18/22 1645  cefTRIAXone (ROCEPHIN) 1 g in sodium chloride 0.9 % 100 mL IVPB        1 g 200 mL/hr over 30 Minutes Intravenous  Once 06/18/22 1636 06/18/22 1740       Data Reviewed: I have personally reviewed following labs and imaging studies  CBC: Recent Labs  Lab 06/18/22 1522 06/19/22 0540  WBC 16.3* 10.8*  HGB 11.4* 9.2*  HCT 34.6* 28.4*  MCV 108.8* 111.4*  PLT 355 742   Basic Metabolic Panel: Recent Labs  Lab 06/18/22  1522 06/19/22 0540  NA 130* 134*  K 4.3 3.8  CL 101 106  CO2 20* 23  GLUCOSE 131* 106*  BUN 16 15  CREATININE 1.03 0.71  CALCIUM 9.0 8.5*   GFR: Estimated Creatinine Clearance: 81.1 mL/min (by C-G formula based on SCr of 0.71 mg/dL). Liver Function Tests: No results for input(s): "AST", "ALT", "ALKPHOS", "BILITOT", "PROT", "ALBUMIN" in the last 168 hours. No results for input(s): "LIPASE", "AMYLASE" in the last 168 hours. No results for input(s): "AMMONIA" in the last 168 hours. Coagulation Profile: Recent Labs  Lab 06/18/22 1524  INR 1.3*   Cardiac Enzymes: No results for input(s): "CKTOTAL", "CKMB", "CKMBINDEX", "TROPONINI" in the last 168 hours. BNP (last 3 results) No  results for input(s): "PROBNP" in the last 8760 hours. HbA1C: No results for input(s): "HGBA1C" in the last 72 hours. CBG: No results for input(s): "GLUCAP" in the last 168 hours. Lipid Profile: No results for input(s): "CHOL", "HDL", "LDLCALC", "TRIG", "CHOLHDL", "LDLDIRECT" in the last 72 hours. Thyroid Function Tests: Recent Labs    06/18/22 2242  TSH 0.135*   Anemia Panel: No results for input(s): "VITAMINB12", "FOLATE", "FERRITIN", "TIBC", "IRON", "RETICCTPCT" in the last 72 hours. Urine analysis:    Component Value Date/Time   COLORURINE YELLOW (A) 06/18/2022 1655   APPEARANCEUR CLEAR (A) 06/18/2022 1655   LABSPEC 1.018 06/18/2022 1655   PHURINE 6.0 06/18/2022 1655   GLUCOSEU NEGATIVE 06/18/2022 1655   HGBUR NEGATIVE 06/18/2022 1655   BILIRUBINUR NEGATIVE 06/18/2022 1655   KETONESUR 5 (A) 06/18/2022 1655   PROTEINUR 100 (A) 06/18/2022 1655   UROBILINOGEN 0.2 01/03/2015 1433   NITRITE NEGATIVE 06/18/2022 1655   LEUKOCYTESUR NEGATIVE 06/18/2022 1655   Sepsis Labs: '@LABRCNTIP'$ (procalcitonin:4,lacticidven:4)  Recent Results (from the past 240 hour(s))  Blood culture (routine x 2)     Status: None (Preliminary result)   Collection Time: 06/18/22  4:55 PM   Specimen: BLOOD  Result Value Ref Range Status   Specimen Description BLOOD [facility]  Final   Special Requests BOTTLES DRAWN AEROBIC AND ANAEROBIC BCAV  Final   Culture   Final    NO GROWTH < 12 HOURS Performed at [facility], [address] [place], [place] [address]    Report Status PENDING  Incomplete  Urine Culture     Status: None   Collection Time: 06/18/22  4:55 PM   Specimen: Urine, Clean Catch  Result Value Ref Range Status   Specimen Description   Final    URINE, CLEAN CATCH Performed at [facility], [address], [place], [place] [address]    Special Requests   Final    NONE Performed at [facility], [address], [place], [place] [address]    Culture   Final     NO GROWTH Performed at [facility], [place] [address], [place], [place] [address]    Report Status 06/19/2022 FINAL  Final  Blood culture (routine x 2)     Status: None (Preliminary result)   Collection Time: 06/18/22  4:56 PM   Specimen: BLOOD  Result Value Ref Range Status   Specimen Description BLOOD RFOA  Final   Special Requests BOTTLES DRAWN AEROBIC AND ANAEROBIC BCAV  Final   Culture   Final    NO GROWTH < 12 HOURS Performed at [facility], [address], [place], [place] [address]    Report Status PENDING  Incomplete         Radiology Studies last 96 hours:  DG OR UROLOGY CYSTO IMAGE ([facility] ONLY)  Result Date: 06/18/2022 There is no interpretation for this exam.  This order is for images obtained during a surgical procedure.  Please See "Surgeries" Tab for more information regarding the procedure.   CT Renal Stone Study  Result Date: 06/18/2022 CLINICAL DATA:  Flank pain, kidney stones suspected EXAM: CT ABDOMEN AND PELVIS WITHOUT CONTRAST TECHNIQUE: Multidetector CT imaging of the abdomen and pelvis was performed following the standard protocol without IV contrast. RADIATION DOSE REDUCTION: This exam was performed according to the departmental dose-optimization program which includes automated exposure control, adjustment of the mA and/or kV according to patient size and/or use of iterative reconstruction technique. COMPARISON:  06/05/2014 FINDINGS: Lower chest: No acute abnormality. Moderate hiatal hernia with intrathoracic position of the gastric fundus. Hepatobiliary: No solid liver abnormality is seen. Multiple fluid attenuation simple cysts seen throughout the liver, similar in appearance to prior examination dated 2015. No gallstones, gallbladder wall thickening, or biliary dilatation. Pancreas: Unremarkable. No pancreatic ductal dilatation or surrounding inflammatory changes. Spleen: Normal in size without significant abnormality. Adrenals/Urinary Tract:  Adrenal glands are unremarkable. Very severe left hydronephrosis and proximal hydroureter with extensive left-sided perinephric fat stranding. There is an abrupt caliber change of the mid left ureter (series 2, image 48) without calculus or other obstructing etiology identified. Bladder is unremarkable. Stomach/Bowel: Stomach is within normal limits. Appendix appears normal. No evidence of bowel wall thickening, distention, or inflammatory changes. Descending and sigmoid diverticulosis. Vascular/Lymphatic: Aortic atherosclerosis. No enlarged abdominal or pelvic lymph nodes. Reproductive: Mild prostatomegaly. Other: No abdominal wall hernia or abnormality. No ascites. Musculoskeletal: No acute or significant osseous findings. Partially imaged subcutaneous soft tissue lesions overlying the tensor fascia lata bilaterally (series 2, image 81). IMPRESSION: 1. Very severe left hydronephrosis and proximal hydroureter with extensive left-sided perinephric fat stranding. There is an abrupt caliber change of the mid left ureter without calculus or other obstructing etiology identified. This may reflect a ureteral stricture or a non radiopaque calculus. No right-sided calculi or hydronephrosis. 2. Moderate hiatal hernia with intrathoracic position of the gastric fundus. 3. Descending and sigmoid diverticulosis without evidence of acute diverticulitis. 4. Mild prostatomegaly. 5. Partially imaged subcutaneous soft tissue lesions overlying the tensor fascia lata bilaterally. These are of uncertain nature, possibly postoperative or posttraumatic. Aortic Atherosclerosis (ICD10-I70.0). Electronically Signed   By: Delanna Ahmadi M.D.   On: 06/18/2022 16:29   DG Chest Portable 1 View  Result Date: 06/18/2022 CLINICAL DATA:  Shortness of breath EXAM: PORTABLE CHEST 1 VIEW COMPARISON:  Chest x-ray dated February 25, 2022 FINDINGS: Visualized cardiac and mediastinal contours are within normal limits. Lungs are clear. No evidence of  pleural effusion or pneumothorax. IMPRESSION: No acute cardiopulmonary abnormality. Electronically Signed   By: Yetta Glassman M.D.   On: 06/18/2022 16:07            LOS: 1 day     Emeterio Reeve, DO [facility] 06/19/2022, 6:09 PM   Staff may message me via secure chat in [place]  but this may not receive immediate response,  please page for urgent matters!  If 7PM-7AM, please contact night-coverage [email or web address]  Dictation software was used to generate the above note. Typos may occur and escape review, as with typed/written notes. Please contact Dr Sheppard Coil directly for clarity if needed.

## 2022-06-19 NOTE — Progress Notes (Signed)
Surgical Physician [facility]  Dr. Erlene Quan * Scheduling expectation :  1 month  *Length of Case:   *Clearance needed: yes, Dr. Johney Frame (cardiology)  *Anticoagulation Instructions: Hold all anticoagulants  *Aspirin Instructions: N/A  *Post-op visit Date/Instructions:  1-2 week with pathology review  *Diagnosis:  Left hydroureteronephrosis, gross hematuria  *Procedure: left  cystoscopy with diagnostic ureteroscopy with possible biopsies and possible ureteral stent placement   Additional orders: N/A  -Admit type: OUTpatient  -Anesthesia: General  -VTE Prophylaxis Standing Order SCD's       Other:   -Standing Lab Orders Per Anesthesia    Lab other: UA&Urine Culture  -Standing Test orders EKG/Chest x-ray per Anesthesia       Test other:   - Medications:  Ancef 2gm IV  -Other orders:  N/A

## 2022-06-20 DIAGNOSIS — R109 Unspecified abdominal pain: Secondary | ICD-10-CM | POA: Diagnosis not present

## 2022-06-20 DIAGNOSIS — D649 Anemia, unspecified: Secondary | ICD-10-CM | POA: Diagnosis not present

## 2022-06-20 DIAGNOSIS — N1330 Unspecified hydronephrosis: Secondary | ICD-10-CM | POA: Diagnosis not present

## 2022-06-20 DIAGNOSIS — A419 Sepsis, unspecified organism: Secondary | ICD-10-CM | POA: Diagnosis not present

## 2022-06-20 LAB — BASIC METABOLIC PANEL
Anion gap: 5 (ref 5–15)
BUN: 10 mg/dL (ref 8–23)
CO2: 25 mmol/L (ref 22–32)
Calcium: 8.4 mg/dL — ABNORMAL LOW (ref 8.9–10.3)
Chloride: 103 mmol/L (ref 98–111)
Creatinine, Ser: 0.68 mg/dL (ref 0.61–1.24)
GFR, Estimated: >60 mL/min (ref >60)
Glucose, Bld: 106 mg/dL — ABNORMAL HIGH (ref 70–99)
Potassium: 3.7 mmol/L (ref 3.5–5.1)
Sodium: 133 mmol/L — ABNORMAL LOW (ref 135–145)

## 2022-06-20 LAB — CBC
HCT: 25.3 % — ABNORMAL LOW (ref 39.0–52.0)
Hemoglobin: 8.3 g/dL — ABNORMAL LOW (ref 13.0–17.0)
MCH: 36.1 pg — ABNORMAL HIGH (ref 26.0–34.0)
MCHC: 32.8 g/dL (ref 30.0–36.0)
MCV: 110 fL — ABNORMAL HIGH (ref 80.0–100.0)
Platelets: 246 10*3/uL (ref 150–400)
RBC: 2.3 MIL/uL — ABNORMAL LOW (ref 4.22–5.81)
RDW: 15.6 % — ABNORMAL HIGH (ref 11.5–15.5)
WBC: 6.4 10*3/uL (ref 4.0–10.5)
nRBC: 0 % (ref 0.0–0.2)

## 2022-06-20 LAB — URINE CULTURE: Culture: NO GROWTH

## 2022-06-20 LAB — CYTOLOGY - NON PAP

## 2022-06-20 LAB — HEMOGLOBIN AND HEMATOCRIT, BLOOD
HCT: 25 % — ABNORMAL LOW (ref 39.0–52.0)
Hemoglobin: 8.1 g/dL — ABNORMAL LOW (ref 13.0–17.0)

## 2022-06-20 MED ORDER — LEVOTHYROXINE SODIUM 100 MCG PO TABS: 100.0000 ug | ORAL_TABLET | Freq: Every day | ORAL | 0 refills | Status: DC

## 2022-06-20 MED ORDER — METOPROLOL TARTRATE 25 MG PO TABS: 25.0000 mg | ORAL_TABLET | Freq: Two times a day (BID) | ORAL | 1 refills | Status: DC

## 2022-06-20 MED ORDER — FERROUS SULFATE 325 (65 FE) MG PO TBEC: 325.0000 mg | DELAYED_RELEASE_TABLET | Freq: Every day | ORAL | 0 refills | Status: DC

## 2022-06-20 NOTE — Progress Notes (Addendum)
Will followup with patient after visit with Dr. Erlene Quan on 07/09/2022.

## 2022-06-20 NOTE — Progress Notes (Signed)
Patient and family educated about foley removal. Foley d/c'd as per MD order. Balloon deflated 30 cc of water. Foley removed. Patient tolerated well.

## 2022-06-20 NOTE — Progress Notes (Signed)
Patient discharge home with spouse as per MD order. Patient IV and tele d/c'd IV catheter intact. IV site is clean, dry. No swelling noted. Patient and spouse given discharge teaching as well as medication teaching. They both verbalized understanding. No concerns voiced at this time. patient vitals stable. Patient was escorted to his ride by a wheelchair.

## 2022-06-23 ENCOUNTER — Telehealth: Payer: Self-pay

## 2022-06-23 LAB — CULTURE, BLOOD (ROUTINE X 2)
Culture: NO GROWTH
Culture: NO GROWTH

## 2022-06-26 ENCOUNTER — Ambulatory Visit (INDEPENDENT_AMBULATORY_CARE_PROVIDER_SITE_OTHER): Payer: Medicare HMO | Admitting: Physician Assistant

## 2022-06-26 ENCOUNTER — Encounter: Payer: Self-pay | Admitting: Physician Assistant

## 2022-06-26 VITALS — BP 126/84 | HR 66 | Temp 97.9°F | Ht 71.0 in | Wt 188.6 lb

## 2022-06-26 DIAGNOSIS — R319 Hematuria, unspecified: Secondary | ICD-10-CM | POA: Diagnosis not present

## 2022-06-26 DIAGNOSIS — D649 Anemia, unspecified: Secondary | ICD-10-CM

## 2022-06-26 DIAGNOSIS — I4891 Unspecified atrial fibrillation: Secondary | ICD-10-CM | POA: Diagnosis not present

## 2022-06-26 DIAGNOSIS — R2681 Unsteadiness on feet: Secondary | ICD-10-CM

## 2022-06-26 LAB — POCT URINALYSIS DIPSTICK
Bilirubin, UA: NEGATIVE
Glucose, UA: NEGATIVE
Ketones, UA: NEGATIVE
Protein, UA: POSITIVE — AB
Spec Grav, UA: 1.01 (ref 1.010–1.025)
Urobilinogen, UA: 0.2 E.U./dL
pH, UA: 5 (ref 5.0–8.0)

## 2022-06-26 NOTE — Progress Notes (Signed)
I,[name],acting as a Education administrator for [employer], PA-C.,have documented all relevant documentation on the behalf of [name], PA-C,as directed by  [name], PA-C while in the presence of [name], PA-C.   Established patient visit   Patient: Eugene White   DOB: 08/23/1946   76 y.o. Male  MRN: [id number] Visit Date: 06/26/2022  Today's healthcare provider: [name], PA-C   Cc. Hospital f/u  Subjective    HPI   Follow up Hospitalization  Patient was admitted to [facility] on 06/18/22 and discharged on 06/20/22. He was treated for Hydroureteronephrosis of left kidney secondary to a large hematoma. Treatment for this included CXR, EKG, Labs. 06/18/22: Cystoscopy, Left retrograde pyelogram, Evacuation of left renal collecting system hematoma, Left ureteral stent placement, Held amlodipine  Increased metoprolol due to tachycardia, holding home Eliquis which he takes for Afib, in sinus rhythm, can restart Eliquis at discretion of PCP/cardiology outpatient Telephone follow up was done on 06/23/22 He reports excellent compliance with treatment. He reports this condition is  improving .  ----------------------------------------------------------------------------------------- - Pt reports falling twice a few days before 6/21, one time very hard off of a curb. He had bruises on b/l hips but had no other pain. Over the next few days up to 6/21 developed L unilateral back pain which sent him to the ED.   Today, reports resolution of pain, still has b/l hip bruises and tailbone bruise. Reports some difficulty with initiating urination which is unusual for him. He has a f/u with urology 7/12 and a repeat CT scan 7/6 to evaluate his L kidney.   He has not restarted the Eliquis, denies any symptoms of Afib since being d/c.   Medications: Outpatient Medications Prior to Visit  Medication Sig   amoxicillin (AMOXIL) 500 MG capsule Take 4 capsules by mouth as directed.  Before dental appointment   atorvastatin (LIPITOR) 20 MG tablet Take 1 tablet (20 mg total) by mouth daily.   Cholecalciferol (VITAMIN D) 2000 UNITS CAPS Take 2,000 Units by mouth every evening.    diphenhydramine-acetaminophen (TYLENOL PM) 25-500 MG TABS tablet Take 4 tablets by mouth at bedtime as needed (sleep).   ferrous sulfate 325 (65 FE) MG EC tablet Take 1 tablet (325 mg total) by mouth daily with breakfast.   ibuprofen (ADVIL) 200 MG tablet Take 800 mg by mouth daily as needed for moderate pain.   levothyroxine (SYNTHROID) 100 MCG tablet Take 1 tablet (100 mcg total) by mouth daily.   magnesium oxide (MAG-OX) 400 (240 Mg) MG tablet TAKE 1 TABLET TWICE DAILY .   Menthol, Topical Analgesic, (BLUE-EMU MAXIMUM STRENGTH EX) Apply 1 application topically at bedtime as needed (pain).   metoprolol tartrate (LOPRESSOR) 25 MG tablet Take 1 tablet (25 mg total) by mouth 2 (two) times daily.   Multiple Vitamin (MULTIVITAMIN WITH MINERALS) TABS tablet Take 1 tablet by mouth daily.    Multiple Vitamins-Minerals (PRESERVISION AREDS 2) CAPS Take 1 capsule by mouth 2 (two) times daily.    pantoprazole (PROTONIX) 40 MG tablet Take 1 tablet (40 mg total) by mouth daily.   potassium chloride SA (KLOR-CON M20) 20 MEQ tablet Take 2 tablets (40 mEq total) by mouth daily.   venlafaxine XR (EFFEXOR-XR) 75 MG 24 hr capsule TAKE 1 CAPSULE EVERY DAY (NEED MD APPOINTMENT FOR REFILLS)   No facility-administered medications prior to visit.    Review of Systems  Constitutional:  Negative for fatigue and fever.  Respiratory:  Negative for cough and shortness  of breath.   Cardiovascular:  Negative for chest pain, palpitations and leg swelling.  Genitourinary:  Positive for difficulty urinating.  Neurological:  Negative for dizziness and headaches.       Objective    Blood pressure 126/84, pulse 66, temperature 97.9 F (36.6 C), temperature source Oral, height _0  (1.803 m), weight 188 lb 9.6 oz (85.5 kg),  SpO2 100 %.   Physical Exam Constitutional:      General: He is awake.     Appearance: He is well-developed.  HENT:     Head: Normocephalic.  Eyes:     Conjunctiva/sclera: Conjunctivae normal.  Cardiovascular:     Rate and Rhythm: Normal rate and regular rhythm.     Heart sounds: Normal heart sounds.  Pulmonary:     Effort: Pulmonary effort is normal.     Breath sounds: Normal breath sounds.  Abdominal:     Tenderness: There is no right CVA tenderness or left CVA tenderness.  Musculoskeletal:     Right lower leg: No edema.     Left lower leg: No edema.  Skin:    General: Skin is warm.     Comments: Large hematoma R hip with minimal tenderness Small ecchymosis at midline lumbar/sacral spine  Neurological:     Mental Status: He is alert and oriented to person, place, and time.  Psychiatric:        Attention and Perception: Attention normal.        Mood and Affect: Mood normal.        Speech: Speech normal.        Behavior: Behavior is cooperative.      Results for orders placed or performed in visit on 27/51/70  Basic Metabolic Panel (BMET)  Result Value Ref Range   Glucose 105 (H) 70 - 99 mg/dL   BUN 13 8 - 27 mg/dL   Creatinine, Ser 0.91 0.76 - 1.27 mg/dL   eGFR 87 >59 mL/min/1.73   BUN/Creatinine Ratio 14 10 - 24   Sodium 136 134 - 144 mmol/L   Potassium 4.8 3.5 - 5.2 mmol/L   Chloride 99 96 - 106 mmol/L   CO2 23 20 - 29 mmol/L   Calcium 9.6 8.6 - 10.2 mg/dL  CBC w/Diff/Platelet  Result Value Ref Range   WBC 5.5 3.4 - 10.8 x10E3/uL   RBC 2.74 (LL) 4.14 - 5.80 x10E6/uL   Hemoglobin 9.7 (L) 13.0 - 17.7 g/dL   Hematocrit 29.1 (L) 37.5 - 51.0 %   MCV 106 (H) 79 - 97 fL   MCH 35.4 (H) 26.6 - 33.0 pg   MCHC 33.3 31.5 - 35.7 g/dL   RDW 13.2 11.6 - 15.4 %   Platelets 564 (H) 150 - 450 x10E3/uL   Neutrophils 54 Not Estab. %   Lymphs 28 Not Estab. %   Monocytes 12 Not Estab. %   Eos 5 Not Estab. %   Basos 1 Not Estab. %   Neutrophils Absolute 3.0 1.4 - 7.0  x10E3/uL   Lymphocytes Absolute 1.5 0.7 - 3.1 x10E3/uL   Monocytes Absolute 0.7 0.1 - 0.9 x10E3/uL   EOS (ABSOLUTE) 0.3 0.0 - 0.4 x10E3/uL   Basophils Absolute 0.1 0.0 - 0.2 x10E3/uL   Immature Granulocytes 0 Not Estab. %   Immature Grans (Abs) 0.0 0.0 - 0.1 x10E3/uL  POCT Urinalysis Dipstick  Result Value Ref Range   Color, UA     Clarity, UA     Glucose, UA Negative Negative   Bilirubin, UA  negtaive    Ketones, UA negative    Spec Grav, UA 1.010 1.010 - 1.025   Blood, UA large    pH, UA 5.0 5.0 - 8.0   Protein, UA Positive (A) Negative   Urobilinogen, UA 0.2 0.2 or 1.0 E.U./dL   Nitrite, UA neagtive    Leukocytes, UA Trace (A) Negative   Appearance     Odor      Assessment & Plan     Problem List Items Addressed This Visit       Cardiovascular and Mediastinum   Atrial fibrillation with rapid ventricular response (HCC) - Primary    Normal rate and regular rhythm on exam today Discussion with pt regarding restarting eliquis -- advising he contact his cardiologist for their opinion as well.  UA today still + hematuria. Given there is some suspicion of a bleed, kidney tumor, etc, we will tentatively wait to restart after his CT next week. If cleared, ok to re-start. Would also like to recheck CBC to ensure anemia is improving before re-starting.  Advised pt if he feels afib rhythm return to call office as we may restart Eliquis early.        Other   Anemia    Recheck cbc to ensure hgb/hct improving      Relevant Orders   CBC w/Diff/Platelet (Completed)   Hematuria    Resolution of gross hematuria UA + leuk and blood Sent for culture       Relevant Orders   POCT Urinalysis Dipstick (Completed)   Urine Culture   Unsteady gait    Pt attributes recent falls to lack of balance Warned that repeat falls are dangerous, strongly advised returning to PT to improve balance Pt declined PT       Other Visit Diagnoses     Hypocalcemia       Relevant Orders   Basic  Metabolic Panel (BMET) (Completed)        Return in about 3 months (around 09/26/2022) for chronic conditions or if new symptoms.      I, [name], PA-C have reviewed all documentation for this visit. The documentation on  06/26/2022 for the exam, diagnosis, procedures, and orders are all accurate and complete.  [name], PA-C [facility] [address] [place], [place], [address] Office: [phone] Fax: [place]

## 2022-06-27 ENCOUNTER — Encounter: Payer: Self-pay | Admitting: Physician Assistant

## 2022-06-27 ENCOUNTER — Other Ambulatory Visit: Payer: Self-pay | Admitting: Physician Assistant

## 2022-06-27 DIAGNOSIS — R319 Hematuria, unspecified: Secondary | ICD-10-CM | POA: Insufficient documentation

## 2022-06-27 DIAGNOSIS — R2681 Unsteadiness on feet: Secondary | ICD-10-CM | POA: Insufficient documentation

## 2022-06-27 DIAGNOSIS — D649 Anemia, unspecified: Secondary | ICD-10-CM

## 2022-06-27 DIAGNOSIS — E039 Hypothyroidism, unspecified: Secondary | ICD-10-CM

## 2022-06-27 LAB — CBC WITH DIFFERENTIAL/PLATELET
Basophils Absolute: 0.1 10*3/uL (ref 0.0–0.2)
Basos: 1 %
EOS (ABSOLUTE): 0.3 10*3/uL (ref 0.0–0.4)
Eos: 5 %
Hematocrit: 29.1 % — ABNORMAL LOW (ref 37.5–51.0)
Hemoglobin: 9.7 g/dL — ABNORMAL LOW (ref 13.0–17.7)
Immature Grans (Abs): 0 10*3/uL (ref 0.0–0.1)
Immature Granulocytes: 0 %
Lymphocytes Absolute: 1.5 10*3/uL (ref 0.7–3.1)
Lymphs: 28 %
MCH: 35.4 pg — ABNORMAL HIGH (ref 26.6–33.0)
MCHC: 33.3 g/dL (ref 31.5–35.7)
MCV: 106 fL — ABNORMAL HIGH (ref 79–97)
Monocytes Absolute: 0.7 10*3/uL (ref 0.1–0.9)
Monocytes: 12 %
Neutrophils Absolute: 3 10*3/uL (ref 1.4–7.0)
Neutrophils: 54 %
Platelets: 564 10*3/uL — ABNORMAL HIGH (ref 150–450)
RBC: 2.74 x10E6/uL — CL (ref 4.14–5.80)
RDW: 13.2 % (ref 11.6–15.4)
WBC: 5.5 10*3/uL (ref 3.4–10.8)

## 2022-06-27 LAB — BASIC METABOLIC PANEL
BUN/Creatinine Ratio: 14 (ref 10–24)
BUN: 13 mg/dL (ref 8–27)
CO2: 23 mmol/L (ref 20–29)
Calcium: 9.6 mg/dL (ref 8.6–10.2)
Chloride: 99 mmol/L (ref 96–106)
Creatinine, Ser: 0.91 mg/dL (ref 0.76–1.27)
Glucose: 105 mg/dL — ABNORMAL HIGH (ref 70–99)
Potassium: 4.8 mmol/L (ref 3.5–5.2)
Sodium: 136 mmol/L (ref 134–144)
eGFR: 87 mL/min/{1.73_m2} (ref >59)

## 2022-06-27 NOTE — Assessment & Plan Note (Signed)
Pt attributes recent falls to lack of balance Warned that repeat falls are dangerous, strongly advised returning to PT to improve balance Pt declined PT

## 2022-06-27 NOTE — Assessment & Plan Note (Signed)
Resolution of gross hematuria UA + leuk and blood Sent for culture

## 2022-06-27 NOTE — Assessment & Plan Note (Signed)
Normal rate and regular rhythm on exam today Discussion with pt regarding restarting eliquis -- advising he contact his cardiologist for their opinion as well.  UA today still + hematuria. Given there is some suspicion of a bleed, kidney tumor, etc, we will tentatively wait to restart after his CT next week. If cleared, ok to re-start. Would also like to recheck CBC to ensure anemia is improving before re-starting.  Advised pt if he feels afib rhythm return to call office as we may restart Eliquis early.

## 2022-06-27 NOTE — Assessment & Plan Note (Signed)
Recheck cbc to ensure hgb/hct improving

## 2022-06-28 LAB — URINE CULTURE: Organism ID, Bacteria: NO GROWTH

## 2022-06-28 LAB — SPECIMEN STATUS REPORT

## 2022-06-30 ENCOUNTER — Telehealth: Payer: Self-pay

## 2022-06-30 NOTE — Telephone Encounter (Signed)
Spoke with Thomas Hoff, PA and she said we will wait to patient sees Dr. Erlene Quan in office on 07/12 in case surgery plan changes.

## 2022-07-03 ENCOUNTER — Ambulatory Visit
Admission: RE | Admit: 2022-07-03 | Discharge: 2022-07-03 | Disposition: A | Discharge status: Home / Self Care | Payer: Medicare HMO | Source: Ambulatory Visit | Attending: Physician Assistant | Admitting: Physician Assistant

## 2022-07-03 DIAGNOSIS — K5730 Diverticulosis of large intestine without perforation or abscess without bleeding: Secondary | ICD-10-CM | POA: Diagnosis not present

## 2022-07-03 DIAGNOSIS — K769 Liver disease, unspecified: Secondary | ICD-10-CM | POA: Diagnosis not present

## 2022-07-03 DIAGNOSIS — N1330 Unspecified hydronephrosis: Secondary | ICD-10-CM | POA: Insufficient documentation

## 2022-07-03 MED ORDER — IOHEXOL 300 MG/ML  SOLN
100.0000 mL | Freq: Once | INTRAMUSCULAR | Status: AC | PRN
Start: 2022-07-03 — End: 2022-07-03
  Administered 2022-07-03: 100 mL via INTRAVENOUS

## 2022-07-04 ENCOUNTER — Telehealth: Payer: Self-pay | Admitting: Cardiology

## 2022-07-04 NOTE — Telephone Encounter (Signed)
Patient's wife would like to know if the patient should go back on Eliquis. Patient saw his PCP on 6/29 and they advised to continue to hold until after his CT scan.  He was in normal rhythm at this time.   Cardiovascular and Mediastinum    Atrial fibrillation with rapid ventricular response (HCC) - Primary      Normal rate and regular rhythm on exam today Discussion with pt regarding restarting eliquis -- advising he contact his cardiologist for their opinion as well.  UA today still + hematuria. Given there is some suspicion of a bleed, kidney tumor, etc, we will tentatively wait to restart after his CT next week. If cleared, ok to re-start. Would also like to recheck CBC to ensure anemia is improving before re-starting.  Advised pt if he feels afib rhythm return to call office as we may restart Eliquis early.       He had a CT scan done yesterday with the advisement below from PCP:  [name], PA-C  07/04/2022  1:32 PM EDT     Called pt with results. Still hesitant about starting back with blood thinner d/t persistent hematuria and continued hydronephrosis. Advised pt to reach out to cardiology for their opinion, will reach out as well   Advised patient's wife that I would send to Dr. Johney Frame for her recommendations.

## 2022-07-04 NOTE — Telephone Encounter (Signed)
Wife is calling to see if it is OK for patient to restart Eliquis.  Eliquis was stopped 6/21 prior to surgery.  Please advise.

## 2022-07-06 NOTE — Telephone Encounter (Signed)
I think it is completely reasonable to hold until he has follow-up with his Urologist to discuss next steps from their end. I believe that is later this week. If not, please let me know and we can come up with a plan.

## 2022-07-07 NOTE — Telephone Encounter (Signed)
Outreach made to [facility].  Advised per [facility] to hold Eliquis until urology visit.  Visit is scheduled for 07/09/2022.  Advised to call back after appt for further advisement.

## 2022-07-09 ENCOUNTER — Ambulatory Visit: Payer: Medicare HMO | Admitting: Urology

## 2022-07-09 ENCOUNTER — Telehealth: Payer: Self-pay

## 2022-07-09 VITALS — BP 142/88 | HR 63 | Ht 71.0 in | Wt 185.0 lb

## 2022-07-09 DIAGNOSIS — N1330 Unspecified hydronephrosis: Secondary | ICD-10-CM

## 2022-07-09 DIAGNOSIS — R319 Hematuria, unspecified: Secondary | ICD-10-CM

## 2022-07-09 NOTE — Progress Notes (Signed)
[facility] Posting Form   Surgery Date/Time: Date: 07/21/2022  Surgeon: Dr. [name], MD  Surgery Location: Day Surgery  Inpt ( No  )   Outpt (Yes)   Obs ( No  )   Diagnosis: Left Hydronephrosis N13.30, Gross Hematuria R31.0  -CPT: 01007, 52351, 12197  Surgery: Left Cystoscopy with Ureteral Stent Exchange, Left Diagnostic Ureteroscopy, Possible Bladder Biopsy  Stop Anticoagulations: Yes, currently already holding Eliquis  Cardiac/Medical/Pulmonary Clearance needed: yes  Clearance needed from Dr: Johney Frame with [facility]  Clearance request sent on: Date: 07/09/22 by Honor Loh, NP with [facility]  *Orders entered into EPIC  Date: 07/09/22   *Case booked in EPIC  Date: 07/09/22  *Notified pt of Surgery: Date: 07/09/22  PRE-OP UA & CX: yes, obtained in office today 07/09/2022  *Placed into Prior Authorization Work Fabio Bering Date: 07/09/22   Assistant/laser/rep:No

## 2022-07-09 NOTE — Addendum Note (Signed)
Addended by: Gerald Leitz A on: 07/09/2022 04:46 PM   Modules accepted: Orders

## 2022-07-09 NOTE — Progress Notes (Signed)
07/10/22 11:59 AM   Eugene White [birth year] [id number]  Referring provider:  [name], PA-C [address] [place],  [place] [address] Chief Complaint  Patient presents with   Hematuria      HPI: Eugene White is a 76 y.o.male for further evaluation of left ureteral obstruction and left renal pelvic bleeding.  He was seen and admitted into the ED on 06/18/2022 for onset left flank pain associated with dysuria. Urinalysis was unremarkable. CT renal stone study showed left severe hydronephrosis and proximal hydroureter with extensive left-sided perinephric fat stranding. I was contacted and took patient to the OR for left ureteral stent placement. He was noted to have bloody urinary drainage after stent placement. He was started on ceftriaxone. Hematuria persisted throughout admission.  There appeared to be possibly obstruction in the mid proximal ureter.  There was massive hydronephrosis which a very large quantity of bloody urine was drained.  He did also experience acute blood loss anemia.  He underwent a CT abdomen and pelvis on 07/03/2022 that visualized moderate to severe left hydronephrosis with a double pigtail ureteral catheter in place. The degree of hydronephrosis is significantly decreased compared prior exam of June 18, 2022.  He reports that during the nighttime when he has to urinate he is unable to void until about 20 seconds after trying to urinate. He reports that his urine is the color of pineapple juice.  He is no longer on blood thinners which are still being held.  His hemoglobin is improving when he followed up with his PCP.  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   First degree AV block    GERD (gastroesophageal reflux disease)    GI symptom    had nausea / vomited once / frequent stools / getting better   Hyperlipidemia    Hypertension    Hypothyroidism    Mild carotid artery disease (HCC)    Mitral regurgitation    Mitral regurgitation    Mood changes    PAF (paroxysmal atrial fibrillation) (HCC)    Pneumonia    hx of   Tricuspid regurgitation    Tricuspid regurgitation     Surgical History: Past Surgical History:  Procedure Laterality Date   CARDIOVERSION N/A 01/12/2018   Procedure: CARDIOVERSION;  Surgeon: Dorothy Spark, MD;  Location: [facility];  Service: Cardiovascular;  Laterality: N/A;   CARDIOVERSION N/A 01/26/2019   Procedure: CARDIOVERSION;  Surgeon: Jerline Pain, MD;  Location: [facility] ENDOSCOPY;  Service: Cardiovascular;  Laterality: N/A;   CARDIOVERSION N/A 09/30/2019   Procedure: CARDIOVERSION;  Surgeon: Lelon Perla, MD;  Location: [facility] ENDOSCOPY;  Service: Cardiovascular;  Laterality: N/A;   CARDIOVERSION N/A 04/04/2022   Procedure: CARDIOVERSION;  Surgeon: Janina Mayo, MD;  Location: [facility] ENDOSCOPY;  Service: Cardiovascular;  Laterality: N/A;   COLONOSCOPY  2014   CYSTOSCOPY WITH STENT PLACEMENT Left 06/18/2022   Procedure: CYSTOSCOPY WITH STENT PLACEMENT;  Surgeon: [name], MD;  Location: [facility] ORS;  Service: Urology;  Laterality: Left;   FEMUR IM NAIL Left 10/11/2016   Procedure: INTRAMEDULLARY (IM) NAIL  FEMORAL;  Surgeon: [name], MD;  Location: [facility] ORS;  Service: Orthopedics;  Laterality: Left;   HAND SURGERY   1973 / 2010 / 2015   right to release tendons   HARDWARE REMOVAL Left 10/05/2017   Procedure: Removal of left femoral nail;  Surgeon: [name], MD;  Location: [facility] ORS;  Service: Orthopedics;  Laterality: Left;  90 mins   HEMATOMA EVACUATION Left 01/19/2018   Procedure: Irrigation and debridement,  Evacuation of left total knee hematoma;  Surgeon: [name], MD;  Location: [facility] ORS;  Service: Orthopedics;  Laterality: Left;   KNEE ARTHROSCOPY  1984 & 2001   twice   NASAL SEPTUM SURGERY     removal Left femoral nail      10/05/17 Dr. [name]   SHOULDER OPEN ROTATOR CUFF REPAIR  1999 & 2006   Right and left   TEE WITH CARDIOVERSION  01/12/2018   TEE WITHOUT CARDIOVERSION N/A 01/12/2018   Procedure: TRANSESOPHAGEAL ECHOCARDIOGRAM (TEE);  Surgeon: Dorothy Spark, MD;  Location: [facility] ENDOSCOPY;  Service: Cardiovascular;  Laterality: N/A;   TONSILLECTOMY     TOTAL KNEE ARTHROPLASTY Right 01/09/2015   Procedure: RIGHT TOTAL KNEE ARTHROPLASTY;  Surgeon: [name], MD;  Location: [facility] ORS;  Service: Orthopedics;  Laterality: Right;   TOTAL KNEE ARTHROPLASTY Left 11/23/2017   Procedure: LEFT TOTAL KNEE ARTHROPLASTY;  Surgeon: [name], MD;  Location: [facility] ORS;  Service: Orthopedics;  Laterality: Left;  90 mins    Home Medications:  Allergies as of 07/09/2022       Reactions   Sulfa Antibiotics Other (See Comments)   Unknown/childhood allergy unknown        Medication List        Accurate as of July 09, 2022 11:59 PM. If you have any questions, ask your nurse or doctor.          amoxicillin 500 MG capsule Commonly known as: AMOXIL Take 4 capsules by mouth as directed. Before dental appointment   atorvastatin 20 MG tablet Commonly known as: LIPITOR Take 1 tablet (20 mg total) by mouth daily.   BLUE-EMU MAXIMUM STRENGTH EX Apply 1 application topically at bedtime as needed (pain).   diphenhydramine-acetaminophen 25-500 MG Tabs tablet Commonly known as: TYLENOL PM Take 4 tablets by mouth at bedtime as needed (sleep).   Eliquis 5 MG Tabs tablet Generic drug: apixaban Take 5 mg by mouth 2 (two) times daily. On hold   ferrous sulfate 325 (65 FE) MG EC tablet Take 1 tablet (325 mg total) by mouth daily with breakfast.   ibuprofen 200 MG tablet Commonly known as:  ADVIL Take 800 mg by mouth daily as needed for moderate pain.   levothyroxine 150 MCG tablet Commonly known as: SYNTHROID Take 150 mcg by mouth daily. What changed: Another medication with the same name was removed. Continue taking this medication, and follow the directions you see here. Changed by: [name], MD   magnesium oxide 400 (240 Mg) MG tablet Commonly known as: MAG-OX TAKE 1 TABLET TWICE DAILY .   metoprolol tartrate 25 MG tablet Commonly known as: LOPRESSOR Take 1 tablet (25 mg total) by mouth 2 (two) times daily.   multivitamin with minerals Tabs tablet Take 1 tablet by mouth daily.   pantoprazole 40 MG tablet Commonly known as: PROTONIX Take 1 tablet (40 mg total) by mouth daily.   potassium chloride SA 20 MEQ tablet Commonly known as: Klor-Con M20 Take 2 tablets (40 mEq total) by mouth daily.  PreserVision AREDS 2 Caps Take 1 capsule by mouth 2 (two) times daily.   venlafaxine XR 75 MG 24 hr capsule Commonly known as: EFFEXOR-XR TAKE 1 CAPSULE EVERY DAY (NEED MD APPOINTMENT FOR REFILLS)   Vitamin D 50 MCG (2000 UT) Caps Take 2,000 Units by mouth every evening.        Allergies:  Allergies  Allergen Reactions   Sulfa Antibiotics Other (See Comments)    Unknown/childhood allergy unknown    Family History: Family History  Problem Relation Age of Onset   Breast cancer Mother    Colon cancer Neg Hx    Esophageal cancer Neg Hx    Stomach cancer Neg Hx    Rectal cancer Neg Hx     Social History:  reports that he quit smoking about 53 years ago. His smoking use included cigarettes. He has never used smokeless tobacco. He reports current alcohol use of about 12.0 standard drinks of alcohol per week. He reports that he does not use drugs.   Physical Exam: BP (!) 142/88   Pulse 63   Ht '5\' 11"'$  (1.803 m)   Wt 185 lb (83.9 kg)   BMI 25.80 kg/m   Constitutional:  Alert and oriented, No acute distress.  Accompanied by his wife today. HEENT:  [place] AT, moist mucus membranes.  Trachea midline, no masses. Cardiovascular: No clubbing, cyanosis, or edema. Respiratory: Normal respiratory effort, no increased work of breathing. Skin: No rashes, bruises or suspicious lesions. Neurologic: Grossly intact, no focal deficits, moving all 4 extremities.  Right facial droop appreciated, chronic. Psychiatric: Normal mood and affect.  Laboratory Data: Lab Results  Component Value Date   CREATININE 0.91 06/26/2022   Lab Results  Component Value Date   HGBA1C 5.1 02/14/2022    Urinalysis Pending  Pertinent Imaging: CLINICAL DATA:  Gross hematuria   EXAM: CT ABDOMEN AND PELVIS WITH CONTRAST   TECHNIQUE: Multidetector CT imaging of the abdomen and pelvis was performed using the standard protocol following bolus administration of intravenous contrast.   RADIATION DOSE REDUCTION: This exam was performed according to the departmental dose-optimization program which includes automated exposure control, adjustment of the mA and/or kV according to patient size and/or use of iterative reconstruction technique.   CONTRAST:  12m OMNIPAQUE IOHEXOL 300 MG/ML  SOLN   COMPARISON:  June 18, 2022, June 05, 2014   FINDINGS: Lower chest: No acute abnormality.   Hepatobiliary: Multiple simple cysts are identified throughout the liver unchanged compared prior exams. In the inferior left lobe liver, there is a 2.4 cm hypodense lesion probably a complicated cyst unchanged compared prior exam of 2015. The gallbladder and biliary tree are normal.   Pancreas: Unremarkable. No pancreatic ductal dilatation or surrounding inflammatory changes.   Spleen: Normal in size without focal abnormality.   Adrenals/Urinary Tract: The bilateral adrenal glands are normal. The right kidney is normal without hydronephrosis. There is left hydronephrosis with a double pigtail ureteral catheter in place. The degree of hydronephrosis is significantly decreased  compared prior exam of June 18, 2022. The bladder is normal.   Stomach/Bowel: Moderate hiatal hernia is identified. The stomach is otherwise normal. There is no small bowel obstruction. There is diverticulosis of colon without diverticulitis. The appendix is normal.   Vascular/Lymphatic: Aortic atherosclerosis. No enlarged abdominal or pelvic lymph nodes.   Reproductive: Prostate is stable.   Other: Small umbilical herniation of mesenteric fat is identified.   Musculoskeletal: Stable degenerative joint changes are identified throughout the spine.   IMPRESSION: Moderate  to severe left hydronephrosis with a double pigtail ureteral catheter in place. The degree of hydronephrosis is significantly decreased compared prior exam of June 18, 2022.   Aortic Atherosclerosis (ICD10-I70.0).     Electronically Signed   By: Abelardo Diesel M.D.   On: 07/04/2022 13:18   I have personally reviewed the images and agree with radiologist interpretation.     Assessment & Plan:   Left hydronephrosis  - Obstruction with large volume of blood s/p stent  - anticoagulation being held, will continue to hold until the source of massive bleeding has been identified - Follow-up CT without any obvious underlying mass which is reassuring, etiology remains unclear although upper tract TCC remains in differential diagnosis - Plan to proceed with right diagnostic ureteroscopy, cystoscopy,  and stent exchange -Risk including bleeding, infection, damage surrounding structures, stent pain, need for further procedures discussed and reviewed.  All questions answered.  Anemia   - No longer bleeding hemoglobin is improving     [employer] as a scribe for [name], MD.,have documented all relevant documentation on the behalf of [name], MD,as directed by  [name], MD while in the presence of [name], MD. I have reviewed the above documentation for accuracy and  completeness, and I agree with the above.   [name], MD   [facility] [address] [place], [place] [address] [phone]

## 2022-07-09 NOTE — Telephone Encounter (Signed)
I spoke with Eugene White. We have discussed possible surgery dates and Monday July 24th, 2023 was agreed upon by all parties. Patient given information about surgery date, what to expect pre-operatively and post operatively.  We discussed that a Pre-Admission Testing office will be calling to set up the pre-op visit that will take place prior to surgery, and that these appointments are typically done over the phone with a Pre-Admissions RN.  Informed patient that our office will communicate any additional care to be provided after surgery. Patients questions or concerns were discussed during our call. Advised to call our office should there be any additional information, questions or concerns that arise. Patient verbalized understanding.

## 2022-07-09 NOTE — H&P (View-Only) (Signed)
07/10/22 11:59 AM   Eugene White Feb 12, 1946 [id number]  Referring provider:  [name], PA-C [address] [place],  [place] [address] Chief Complaint  Patient presents with   Hematuria      HPI: Eugene White is a 76 y.o.male for further evaluation of left ureteral obstruction and left renal pelvic bleeding.  He was seen and admitted into the ED on 06/18/2022 for onset left flank pain associated with dysuria. Urinalysis was unremarkable. CT renal stone study showed left severe hydronephrosis and proximal hydroureter with extensive left-sided perinephric fat stranding. I was contacted and took patient to the OR for left ureteral stent placement. He was noted to have bloody urinary drainage after stent placement. He was started on ceftriaxone. Hematuria persisted throughout admission.  There appeared to be possibly obstruction in the mid proximal ureter.  There was massive hydronephrosis which a very large quantity of bloody urine was drained.  He did also experience acute blood loss anemia.  He underwent a CT abdomen and pelvis on 07/03/2022 that visualized moderate to severe left hydronephrosis with a double pigtail ureteral catheter in place. The degree of hydronephrosis is significantly decreased compared prior exam of June 18, 2022.  He reports that during the nighttime when he has to urinate he is unable to void until about 20 seconds after trying to urinate. He reports that his urine is the color of pineapple juice.  He is no longer on blood thinners which are still being held.  His hemoglobin is improving when he followed up with his PCP.  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   First degree AV block    GERD (gastroesophageal reflux disease)    GI symptom    had nausea / vomited once / frequent stools / getting better   Hyperlipidemia    Hypertension    Hypothyroidism    Mild carotid artery disease (HCC)    Mitral regurgitation    Mitral regurgitation    Mood changes    PAF (paroxysmal atrial fibrillation) (HCC)    Pneumonia    hx of   Tricuspid regurgitation    Tricuspid regurgitation     Surgical History: Past Surgical History:  Procedure Laterality Date   CARDIOVERSION N/A 01/12/2018   Procedure: CARDIOVERSION;  Surgeon: Dorothy Spark, MD;  Location: [facility];  Service: Cardiovascular;  Laterality: N/A;   CARDIOVERSION N/A 01/26/2019   Procedure: CARDIOVERSION;  Surgeon: Jerline Pain, MD;  Location: [facility] ENDOSCOPY;  Service: Cardiovascular;  Laterality: N/A;   CARDIOVERSION N/A 09/30/2019   Procedure: CARDIOVERSION;  Surgeon: Lelon Perla, MD;  Location: [facility] ENDOSCOPY;  Service: Cardiovascular;  Laterality: N/A;   CARDIOVERSION N/A 04/04/2022   Procedure: CARDIOVERSION;  Surgeon: Janina Mayo, MD;  Location: [facility] ENDOSCOPY;  Service: Cardiovascular;  Laterality: N/A;   COLONOSCOPY  2014   CYSTOSCOPY WITH STENT PLACEMENT Left 06/18/2022   Procedure: CYSTOSCOPY WITH STENT PLACEMENT;  Surgeon: [name], MD;  Location: [facility] ORS;  Service: Urology;  Laterality: Left;   FEMUR IM NAIL Left 10/11/2016   Procedure: INTRAMEDULLARY (IM) NAIL  FEMORAL;  Surgeon: [name], MD;  Location: [facility] ORS;  Service: Orthopedics;  Laterality: Left;   HAND SURGERY   1973 / 2010 / 2015   right to release tendons   HARDWARE REMOVAL Left 10/05/2017   Procedure: Removal of left femoral nail;  Surgeon: [name], MD;  Location: [facility] ORS;  Service: Orthopedics;  Laterality: Left;  90 mins   HEMATOMA EVACUATION Left 01/19/2018   Procedure: Irrigation and debridement,  Evacuation of left total knee hematoma;  Surgeon: [name], MD;  Location: [facility]S;  Service: Orthopedics;  Laterality: Left;   KNEE ARTHROSCOPY  1984 & 2001   twice   NASAL SEPTUM SURGERY     removal Left femoral nail      10/05/17 Dr. [name]   SHOULDER OPEN ROTATOR CUFF REPAIR  1999 & 2006   Right and left   TEE WITH CARDIOVERSION  01/12/2018   TEE WITHOUT CARDIOVERSION N/A 01/12/2018   Procedure: TRANSESOPHAGEAL ECHOCARDIOGRAM (TEE);  Surgeon: Dorothy Spark, MD;  Location: [facility] ENDOSCOPY;  Service: Cardiovascular;  Laterality: N/A;   TONSILLECTOMY     TOTAL KNEE ARTHROPLASTY Right 01/09/2015   Procedure: RIGHT TOTAL KNEE ARTHROPLASTY;  Surgeon: [name], MD;  Location: [facility];  Service: Orthopedics;  Laterality: Right;   TOTAL KNEE ARTHROPLASTY Left 11/23/2017   Procedure: LEFT TOTAL KNEE ARTHROPLASTY;  Surgeon: [name], MD;  Location: [facility]S;  Service: Orthopedics;  Laterality: Left;  90 mins    Home Medications:  Allergies as of 07/09/2022       Reactions   Sulfa Antibiotics Other (See Comments)   Unknown/childhood allergy unknown        Medication List        Accurate as of July 09, 2022 11:59 PM. If you have any questions, ask your nurse or doctor.          amoxicillin 500 MG capsule Commonly known as: AMOXIL Take 4 capsules by mouth as directed. Before dental appointment   atorvastatin 20 MG tablet Commonly known as: LIPITOR Take 1 tablet (20 mg total) by mouth daily.   BLUE-EMU MAXIMUM STRENGTH EX Apply 1 application topically at bedtime as needed (pain).   diphenhydramine-acetaminophen 25-500 MG Tabs tablet Commonly known as: TYLENOL PM Take 4 tablets by mouth at bedtime as needed (sleep).   Eliquis 5 MG Tabs tablet Generic drug: apixaban Take 5 mg by mouth 2 (two) times daily. On hold   ferrous sulfate 325 (65 FE) MG EC tablet Take 1 tablet (325 mg total) by mouth daily with breakfast.   ibuprofen 200 MG tablet Commonly known as:  ADVIL Take 800 mg by mouth daily as needed for moderate pain.   levothyroxine 150 MCG tablet Commonly known as: SYNTHROID Take 150 mcg by mouth daily. What changed: Another medication with the same name was removed. Continue taking this medication, and follow the directions you see here. Changed by: [name], MD   magnesium oxide 400 (240 Mg) MG tablet Commonly known as: MAG-OX TAKE 1 TABLET TWICE DAILY .   metoprolol tartrate 25 MG tablet Commonly known as: LOPRESSOR Take 1 tablet (25 mg total) by mouth 2 (two) times daily.   multivitamin with minerals Tabs tablet Take 1 tablet by mouth daily.   pantoprazole 40 MG tablet Commonly known as: PROTONIX Take 1 tablet (40 mg total) by mouth daily.   potassium chloride SA 20 MEQ tablet Commonly known as: Klor-Con M20 Take 2 tablets (40 mEq total) by mouth daily.  PreserVision AREDS 2 Caps Take 1 capsule by mouth 2 (two) times daily.   venlafaxine XR 75 MG 24 hr capsule Commonly known as: EFFEXOR-XR TAKE 1 CAPSULE EVERY DAY (NEED MD APPOINTMENT FOR REFILLS)   Vitamin D 50 MCG (2000 UT) Caps Take 2,000 Units by mouth every evening.        Allergies:  Allergies  Allergen Reactions   Sulfa Antibiotics Other (See Comments)    Unknown/childhood allergy unknown    Family History: Family History  Problem Relation Age of Onset   Breast cancer Mother    Colon cancer Neg Hx    Esophageal cancer Neg Hx    Stomach cancer Neg Hx    Rectal cancer Neg Hx     Social History:  reports that he quit smoking about 53 years ago. His smoking use included cigarettes. He has never used smokeless tobacco. He reports current alcohol use of about 12.0 standard drinks of alcohol per week. He reports that he does not use drugs.   Physical Exam: BP (!) 142/88   Pulse 63   Ht '5\' 11"'$  (1.803 m)   Wt 185 lb (83.9 kg)   BMI 25.80 kg/m   Constitutional:  Alert and oriented, No acute distress.  Accompanied by his wife today. HEENT:  [place] AT, moist mucus membranes.  Trachea midline, no masses. Cardiovascular: No clubbing, cyanosis, or edema. Respiratory: Normal respiratory effort, no increased work of breathing. Skin: No rashes, bruises or suspicious lesions. Neurologic: Grossly intact, no focal deficits, moving all 4 extremities.  Right facial droop appreciated, chronic. Psychiatric: Normal mood and affect.  Laboratory Data: Lab Results  Component Value Date   CREATININE 0.91 06/26/2022   Lab Results  Component Value Date   HGBA1C 5.1 02/14/2022    Urinalysis Pending  Pertinent Imaging: CLINICAL DATA:  Gross hematuria   EXAM: CT ABDOMEN AND PELVIS WITH CONTRAST   TECHNIQUE: Multidetector CT imaging of the abdomen and pelvis was performed using the standard protocol following bolus administration of intravenous contrast.   RADIATION DOSE REDUCTION: This exam was performed according to the departmental dose-optimization program which includes automated exposure control, adjustment of the mA and/or kV according to patient size and/or use of iterative reconstruction technique.   CONTRAST:  119m OMNIPAQUE IOHEXOL 300 MG/ML  SOLN   COMPARISON:  June 18, 2022, June 05, 2014   FINDINGS: Lower chest: No acute abnormality.   Hepatobiliary: Multiple simple cysts are identified throughout the liver unchanged compared prior exams. In the inferior left lobe liver, there is a 2.4 cm hypodense lesion probably a complicated cyst unchanged compared prior exam of 2015. The gallbladder and biliary tree are normal.   Pancreas: Unremarkable. No pancreatic ductal dilatation or surrounding inflammatory changes.   Spleen: Normal in size without focal abnormality.   Adrenals/Urinary Tract: The bilateral adrenal glands are normal. The right kidney is normal without hydronephrosis. There is left hydronephrosis with a double pigtail ureteral catheter in place. The degree of hydronephrosis is significantly decreased  compared prior exam of June 18, 2022. The bladder is normal.   Stomach/Bowel: Moderate hiatal hernia is identified. The stomach is otherwise normal. There is no small bowel obstruction. There is diverticulosis of colon without diverticulitis. The appendix is normal.   Vascular/Lymphatic: Aortic atherosclerosis. No enlarged abdominal or pelvic lymph nodes.   Reproductive: Prostate is stable.   Other: Small umbilical herniation of mesenteric fat is identified.   Musculoskeletal: Stable degenerative joint changes are identified throughout the spine.   IMPRESSION: Moderate  to severe left hydronephrosis with a double pigtail ureteral catheter in place. The degree of hydronephrosis is significantly decreased compared prior exam of June 18, 2022.   Aortic Atherosclerosis (ICD10-I70.0).     Electronically Signed   By: Abelardo Diesel M.D.   On: 07/04/2022 13:18   I have personally reviewed the images and agree with radiologist interpretation.     Assessment & Plan:   Left hydronephrosis  - Obstruction with large volume of blood s/p stent  - anticoagulation being held, will continue to hold until the source of massive bleeding has been identified - Follow-up CT without any obvious underlying mass which is reassuring, etiology remains unclear although upper tract TCC remains in differential diagnosis - Plan to proceed with right diagnostic ureteroscopy, cystoscopy,  and stent exchange -Risk including bleeding, infection, damage surrounding structures, stent pain, need for further procedures discussed and reviewed.  All questions answered.  Anemia   - No longer bleeding hemoglobin is improving     [employer] as a scribe for [name], MD.,have documented all relevant documentation on the behalf of [name], MD,as directed by  [name], MD while in the presence of [name], MD. I have reviewed the above documentation for accuracy and  completeness, and I agree with the above.   [name], MD   [facility] [address] [place], [place] [address] [phone]

## 2022-07-10 LAB — URINALYSIS, COMPLETE
Bilirubin, UA: NEGATIVE
Glucose, UA: NEGATIVE
Nitrite, UA: NEGATIVE
Protein,UA: NEGATIVE
Specific Gravity, UA: 1.015 (ref 1.005–1.030)
Urobilinogen, Ur: 0.2 mg/dL (ref 0.2–1.0)
pH, UA: 5.5 (ref 5.0–7.5)

## 2022-07-10 LAB — MICROSCOPIC EXAMINATION: Bacteria, UA: NONE SEEN

## 2022-07-12 LAB — CULTURE, URINE COMPREHENSIVE

## 2022-07-13 ENCOUNTER — Other Ambulatory Visit (HOSPITAL_BASED_OUTPATIENT_CLINIC_OR_DEPARTMENT_OTHER): Payer: Self-pay | Admitting: Osteopathic Medicine

## 2022-07-13 DIAGNOSIS — E039 Hypothyroidism, unspecified: Secondary | ICD-10-CM

## 2022-07-15 ENCOUNTER — Other Ambulatory Visit: Payer: Self-pay

## 2022-07-17 ENCOUNTER — Telehealth: Payer: Self-pay | Admitting: Physician Assistant

## 2022-07-17 NOTE — Telephone Encounter (Signed)
[facility] faxed refill request for the following medications:  atorvastatin (LIPITOR) 20 MG tablet   Please advise.

## 2022-07-17 NOTE — Addendum Note (Signed)
Addended by: Barnie Mort on: 07/17/2022 03:53 PM   Modules accepted: Orders

## 2022-07-18 ENCOUNTER — Encounter: Payer: Self-pay | Admitting: Urgent Care

## 2022-07-18 ENCOUNTER — Encounter: Payer: Self-pay | Admitting: Urology

## 2022-07-18 ENCOUNTER — Other Ambulatory Visit: Payer: Self-pay

## 2022-07-18 ENCOUNTER — Encounter
Admission: RE | Admit: 2022-07-18 | Discharge: 2022-07-18 | Disposition: A | Discharge status: Home / Self Care | Payer: Medicare HMO | Source: Ambulatory Visit | Attending: Urology | Admitting: Urology

## 2022-07-18 DIAGNOSIS — I10 Essential (primary) hypertension: Secondary | ICD-10-CM

## 2022-07-18 DIAGNOSIS — Z01812 Encounter for preprocedural laboratory examination: Secondary | ICD-10-CM

## 2022-07-18 DIAGNOSIS — R310 Gross hematuria: Secondary | ICD-10-CM | POA: Diagnosis not present

## 2022-07-18 DIAGNOSIS — D500 Iron deficiency anemia secondary to blood loss (chronic): Secondary | ICD-10-CM

## 2022-07-18 LAB — CBC
HCT: 39.3 % (ref 39.0–52.0)
Hemoglobin: 12.9 g/dL — ABNORMAL LOW (ref 13.0–17.0)
MCH: 34.5 pg — ABNORMAL HIGH (ref 26.0–34.0)
MCHC: 32.8 g/dL (ref 30.0–36.0)
MCV: 105.1 fL — ABNORMAL HIGH (ref 80.0–100.0)
Platelets: 229 10*3/uL (ref 150–400)
RBC: 3.74 MIL/uL — ABNORMAL LOW (ref 4.22–5.81)
RDW: 13.9 % (ref 11.5–15.5)
WBC: 4.6 10*3/uL (ref 4.0–10.5)
nRBC: 0 % (ref 0.0–0.2)

## 2022-07-18 LAB — BASIC METABOLIC PANEL
Anion gap: 10 (ref 5–15)
BUN: 11 mg/dL (ref 8–23)
CO2: 27 mmol/L (ref 22–32)
Calcium: 9.2 mg/dL (ref 8.9–10.3)
Chloride: 101 mmol/L (ref 98–111)
Creatinine, Ser: 0.96 mg/dL (ref 0.61–1.24)
GFR, Estimated: >60 mL/min (ref >60)
Glucose, Bld: 104 mg/dL — ABNORMAL HIGH (ref 70–99)
Potassium: 3.7 mmol/L (ref 3.5–5.1)
Sodium: 138 mmol/L (ref 135–145)

## 2022-07-18 NOTE — Patient Instructions (Addendum)
Your procedure is scheduled on: 07/21/22 - Monday Report to the Registration Desk on the 1st floor of the [facility]. To find out your arrival time, please call [phone] between 1PM - 3PM on: 07/18/22 - Friday If your arrival time is 6:00 am, do not arrive prior to that time as the [facility] entrance doors do not open until 6:00 am.  REMEMBER: Instructions that are not followed completely may result in serious medical risk, up to and including death; or upon the discretion of your surgeon and anesthesiologist your surgery may need to be rescheduled.  Do not eat food or drink any fluids after midnight the night before surgery.  No gum chewing, lozengers or hard candies.  TAKE THESE MEDICATIONS THE MORNING OF SURGERY WITH A SIP OF WATER:   - levothyroxine (SYNTHROID)  - metoprolol tartrate - pantoprazole (PROTONIX)take one the night before and one on the morning of surgery - helps to prevent nausea after surgery.) - venlafaxine XR (EFFEXOR-XR)  Follow recommendations from Cardiologist, Pulmonologist or PCP regarding stopping Aspirin, Coumadin, Plavix, Eliquis, Pradaxa, or Pletal. Patient reports not taking Eliquis.  One week prior to surgery: Stop Anti-inflammatories (NSAIDS) such as Advil, Aleve, Ibuprofen, Motrin, Naproxen, Naprosyn and Aspirin based products such as Excedrin, Goodys Powder, BC Powder.  Stop ANY OVER THE COUNTER supplements until after surgery.  You may however, continue to take Tylenol if needed for pain up until the day of surgery.  No Alcohol for 24 hours before or after surgery.  No Smoking including e-cigarettes for 24 hours prior to surgery.  No chewable tobacco products for at least 6 hours prior to surgery.  No nicotine patches on the day of surgery.  Do not use any "recreational" drugs for at least a week prior to your surgery.  Please be advised that the combination of cocaine and anesthesia may have negative outcomes, up to and including  death. If you test positive for cocaine, your surgery will be cancelled.  On the morning of surgery brush your teeth with toothpaste and water, you may rinse your mouth with mouthwash if you wish. Do not swallow any toothpaste or mouthwash.  Do not wear jewelry, make-up, hairpins, clips or nail polish.  Do not wear lotions, powders, or perfumes.   Do not shave body from the neck down 48 hours prior to surgery just in case you cut yourself which could leave a site for infection.  Also, freshly shaved skin may become irritated if using the CHG soap.  Contact lenses, hearing aids and dentures may not be worn into surgery.  Do not bring valuables to the hospital. [facility] is not responsible for any missing/lost belongings or valuables.   Notify your doctor if there is any change in your medical condition (cold, fever, infection).  Wear comfortable clothing (specific to your surgery type) to the hospital.  After surgery, you can help prevent lung complications by doing breathing exercises.  Take deep breaths and cough every 1-2 hours. Your doctor may order a device called an Incentive Spirometer to help you take deep breaths. When coughing or sneezing, hold a pillow firmly against your incision with both hands. This is called "splinting." Doing this helps protect your incision. It also decreases belly discomfort.  If you are being admitted to the hospital overnight, leave your suitcase in the car. After surgery it may be brought to your room.  If you are being discharged the day of surgery, you will not be allowed to drive home.  You will need a responsible adult (18 years or older) to drive you home and stay with you that night.   If you are taking public transportation, you will need to have a responsible adult (18 years or older) with you. Please confirm with your physician that it is acceptable to use public transportation.   Please call the [facility] Dept[facility] at [phone] if you have any questions about these instructions.  Surgery Visitation Policy:  Patients undergoing a surgery or procedure may have two family members or support persons with them as long as the person is not COVID-19 positive or experiencing its symptoms.   Inpatient Visitation:    Visiting hours are 7 a.m. to 8 p.m. Up to four visitors are allowed at one time in a patient room, including children. The visitors may rotate out with other people during the day. One designated support person (adult) may remain overnight.

## 2022-07-21 ENCOUNTER — Other Ambulatory Visit: Payer: Self-pay

## 2022-07-21 ENCOUNTER — Ambulatory Visit: Payer: Medicare HMO

## 2022-07-21 ENCOUNTER — Ambulatory Visit: Payer: Medicare HMO | Admitting: Urgent Care

## 2022-07-21 ENCOUNTER — Ambulatory Visit
Admission: RE | Admit: 2022-07-21 | Discharge: 2022-07-21 | Disposition: A | Discharge status: Home / Self Care | Payer: Medicare HMO | Attending: Urology | Admitting: Urology

## 2022-07-21 ENCOUNTER — Encounter: Payer: Self-pay | Admitting: Urology

## 2022-07-21 ENCOUNTER — Encounter
Admission: RE | Disposition: A | Discharge status: Home / Self Care | Payer: Self-pay | Source: Home / Self Care | Attending: Urology

## 2022-07-21 DIAGNOSIS — E039 Hypothyroidism, unspecified: Secondary | ICD-10-CM | POA: Insufficient documentation

## 2022-07-21 DIAGNOSIS — K219 Gastro-esophageal reflux disease without esophagitis: Secondary | ICD-10-CM | POA: Insufficient documentation

## 2022-07-21 DIAGNOSIS — I10 Essential (primary) hypertension: Secondary | ICD-10-CM | POA: Insufficient documentation

## 2022-07-21 DIAGNOSIS — I480 Paroxysmal atrial fibrillation: Secondary | ICD-10-CM | POA: Insufficient documentation

## 2022-07-21 DIAGNOSIS — F32A Depression, unspecified: Secondary | ICD-10-CM | POA: Diagnosis not present

## 2022-07-21 DIAGNOSIS — R319 Hematuria, unspecified: Secondary | ICD-10-CM | POA: Insufficient documentation

## 2022-07-21 DIAGNOSIS — R310 Gross hematuria: Secondary | ICD-10-CM | POA: Diagnosis not present

## 2022-07-21 DIAGNOSIS — N2889 Other specified disorders of kidney and ureter: Secondary | ICD-10-CM | POA: Insufficient documentation

## 2022-07-21 DIAGNOSIS — Z87891 Personal history of nicotine dependence: Secondary | ICD-10-CM | POA: Diagnosis not present

## 2022-07-21 DIAGNOSIS — N138 Other obstructive and reflux uropathy: Secondary | ICD-10-CM | POA: Diagnosis not present

## 2022-07-21 DIAGNOSIS — E785 Hyperlipidemia, unspecified: Secondary | ICD-10-CM | POA: Insufficient documentation

## 2022-07-21 DIAGNOSIS — N1330 Unspecified hydronephrosis: Secondary | ICD-10-CM | POA: Insufficient documentation

## 2022-07-21 HISTORY — PX: URETEROSCOPY: SHX842

## 2022-07-21 HISTORY — PX: CYSTOSCOPY WITH STENT PLACEMENT: SHX5790

## 2022-07-21 HISTORY — PX: URETERAL BIOPSY: SHX6688

## 2022-07-21 SURGERY — CYSTOSCOPY, WITH STENT INSERTION
Anesthesia: General | Site: Ureter | Laterality: Left

## 2022-07-21 MED ORDER — ROCURONIUM BROMIDE 100 MG/10ML IV SOLN
INTRAVENOUS | Status: DC | PRN
  Administered 2022-07-21: 60 mg via INTRAVENOUS

## 2022-07-21 MED ORDER — PROPOFOL 10 MG/ML IV BOLUS
INTRAVENOUS | Status: DC | PRN
  Administered 2022-07-21: 140 mg via INTRAVENOUS
  Administered 2022-07-21: 40 mg via INTRAVENOUS

## 2022-07-21 MED ORDER — ONDANSETRON HCL 4 MG/2ML IJ SOLN
INTRAMUSCULAR | Status: AC
  Filled 2022-07-21: qty 2

## 2022-07-21 MED ORDER — ONDANSETRON HCL 4 MG/2ML IJ SOLN
INTRAMUSCULAR | Status: DC | PRN
  Administered 2022-07-21: 4 mg via INTRAVENOUS

## 2022-07-21 MED ORDER — CHLORHEXIDINE GLUCONATE 0.12 % MT SOLN
OROMUCOSAL | Status: AC
  Administered 2022-07-21: 15 mL via OROMUCOSAL
  Filled 2022-07-21: qty 15

## 2022-07-21 MED ORDER — EPHEDRINE SULFATE (PRESSORS) 50 MG/ML IJ SOLN
INTRAMUSCULAR | Status: DC | PRN
  Administered 2022-07-21 (×2): 10 mg via INTRAVENOUS

## 2022-07-21 MED ORDER — ORAL CARE MOUTH RINSE: 15.0000 mL | Freq: Once | OROMUCOSAL | Status: AC

## 2022-07-21 MED ORDER — MIDAZOLAM HCL 2 MG/2ML IJ SOLN
INTRAMUSCULAR | Status: DC | PRN
  Administered 2022-07-21: 1 mg via INTRAVENOUS

## 2022-07-21 MED ORDER — LACTATED RINGERS IV SOLN: INTRAVENOUS | Status: DC

## 2022-07-21 MED ORDER — EPHEDRINE 5 MG/ML INJ
INTRAVENOUS | Status: AC
  Filled 2022-07-21: qty 5

## 2022-07-21 MED ORDER — CHLORHEXIDINE GLUCONATE 0.12 % MT SOLN: 15.0000 mL | Freq: Once | OROMUCOSAL | Status: AC

## 2022-07-21 MED ORDER — PHENYLEPHRINE HCL (PRESSORS) 10 MG/ML IV SOLN
INTRAVENOUS | Status: DC | PRN
  Administered 2022-07-21: 240 ug via INTRAVENOUS
  Administered 2022-07-21: 80 ug via INTRAVENOUS

## 2022-07-21 MED ORDER — ROCURONIUM BROMIDE 10 MG/ML (PF) SYRINGE
PREFILLED_SYRINGE | INTRAVENOUS | Status: AC
  Filled 2022-07-21: qty 10

## 2022-07-21 MED ORDER — IOHEXOL 180 MG/ML  SOLN
INTRAMUSCULAR | Status: DC | PRN
  Administered 2022-07-21 (×2): 10 mL

## 2022-07-21 MED ORDER — CEFAZOLIN SODIUM-DEXTROSE 2-4 GM/100ML-% IV SOLN
2.0000 g | INTRAVENOUS | Status: AC
  Administered 2022-07-21: 2 g via INTRAVENOUS

## 2022-07-21 MED ORDER — DEXAMETHASONE SODIUM PHOSPHATE 10 MG/ML IJ SOLN
INTRAMUSCULAR | Status: DC | PRN
  Administered 2022-07-21: 5 mg via INTRAVENOUS

## 2022-07-21 MED ORDER — FENTANYL CITRATE (PF) 100 MCG/2ML IJ SOLN
INTRAMUSCULAR | Status: DC | PRN
  Administered 2022-07-21: 25 ug via INTRAVENOUS
  Administered 2022-07-21: 50 ug via INTRAVENOUS

## 2022-07-21 MED ORDER — SODIUM CHLORIDE 0.9 % IR SOLN
Status: DC | PRN
  Administered 2022-07-21: 3000 mL

## 2022-07-21 MED ORDER — PHENYLEPHRINE 80 MCG/ML (10ML) SYRINGE FOR IV PUSH (FOR BLOOD PRESSURE SUPPORT)
PREFILLED_SYRINGE | INTRAVENOUS | Status: AC
  Filled 2022-07-21: qty 10

## 2022-07-21 MED ORDER — GLYCOPYRROLATE 0.2 MG/ML IJ SOLN
INTRAMUSCULAR | Status: DC | PRN
  Administered 2022-07-21: .2 mg via INTRAVENOUS

## 2022-07-21 MED ORDER — MIDAZOLAM HCL 2 MG/2ML IJ SOLN
INTRAMUSCULAR | Status: AC
  Filled 2022-07-21: qty 2

## 2022-07-21 MED ORDER — SUGAMMADEX SODIUM 200 MG/2ML IV SOLN
INTRAVENOUS | Status: DC | PRN
  Administered 2022-07-21: 200 mg via INTRAVENOUS

## 2022-07-21 MED ORDER — PROPOFOL 10 MG/ML IV BOLUS
INTRAVENOUS | Status: AC
  Filled 2022-07-21: qty 20

## 2022-07-21 MED ORDER — OXYCODONE HCL 5 MG/5ML PO SOLN: 5.0000 mg | Freq: Once | ORAL | Status: DC | PRN

## 2022-07-21 MED ORDER — CEFAZOLIN SODIUM-DEXTROSE 2-4 GM/100ML-% IV SOLN
INTRAVENOUS | Status: AC
  Filled 2022-07-21: qty 100

## 2022-07-21 MED ORDER — LIDOCAINE HCL (CARDIAC) PF 100 MG/5ML IV SOSY
PREFILLED_SYRINGE | INTRAVENOUS | Status: DC | PRN
  Administered 2022-07-21: 100 mg via INTRAVENOUS

## 2022-07-21 MED ORDER — OXYCODONE HCL 5 MG PO TABS: 5.0000 mg | ORAL_TABLET | Freq: Once | ORAL | Status: DC | PRN

## 2022-07-21 MED ORDER — FENTANYL CITRATE (PF) 100 MCG/2ML IJ SOLN: 25.0000 ug | INTRAMUSCULAR | Status: DC | PRN

## 2022-07-21 MED ORDER — ACETAMINOPHEN 10 MG/ML IV SOLN
INTRAVENOUS | Status: DC | PRN
  Administered 2022-07-21: 1000 mg via INTRAVENOUS

## 2022-07-21 MED ORDER — FENTANYL CITRATE (PF) 100 MCG/2ML IJ SOLN
INTRAMUSCULAR | Status: AC
  Filled 2022-07-21: qty 2

## 2022-07-21 MED ORDER — DEXAMETHASONE SODIUM PHOSPHATE 10 MG/ML IJ SOLN
INTRAMUSCULAR | Status: AC
  Filled 2022-07-21: qty 1

## 2022-07-21 SURGICAL SUPPLY — 38 items
BAG DRAIN CYSTO-URO LG1000N (MISCELLANEOUS) ×4 IMPLANT
BAG PRESSURE INF REUSE 3000 (BAG) ×4 IMPLANT
BRUSH SCRUB EZ  4% CHG (MISCELLANEOUS) ×1
BRUSH SCRUB EZ 1% IODOPHOR (MISCELLANEOUS) ×4 IMPLANT
BRUSH SCRUB EZ 4% CHG (MISCELLANEOUS) ×3 IMPLANT
CATH URET FLEX-TIP 2 LUMEN 10F (CATHETERS) ×4 IMPLANT
CATH URETL OPEN 5X70 (CATHETERS) ×4 IMPLANT
CNTNR SPEC 2.5X3XGRAD LEK (MISCELLANEOUS) ×3
CONRAY 43 FOR UROLOGY 50M (MISCELLANEOUS) ×2 IMPLANT
CONT SPEC 4OZ STER OR WHT (MISCELLANEOUS) ×1
CONT SPEC 4OZ STRL OR WHT (MISCELLANEOUS) ×3
CONTAINER SPEC 2.5X3XGRAD LEK (MISCELLANEOUS) ×3 IMPLANT
DRSG TELFA 3X4 N-ADH STERILE (GAUZE/BANDAGES/DRESSINGS) ×4 IMPLANT
ELECT REM PT RETURN 9FT ADLT (ELECTROSURGICAL) ×4
ELECTRODE REM PT RTRN 9FT ADLT (ELECTROSURGICAL) ×3 IMPLANT
FORCEPS BIOP PIRANHA Y (CUTTING FORCEPS) ×2 IMPLANT
GAUZE 4X4 16PLY ~~LOC~~+RFID DBL (SPONGE) ×4 IMPLANT
GLOVE BIO SURGEON STRL SZ 6.5 (GLOVE) ×4 IMPLANT
GOWN STRL REUS W/ TWL LRG LVL3 (GOWN DISPOSABLE) ×6 IMPLANT
GOWN STRL REUS W/TWL LRG LVL3 (GOWN DISPOSABLE) ×8
GUIDEWIRE GREEN .038 145CM (MISCELLANEOUS) ×4 IMPLANT
GUIDEWIRE STR DUAL SENSOR (WIRE) ×6 IMPLANT
IV NS IRRIG 3000ML ARTHROMATIC (IV SOLUTION) ×4 IMPLANT
KIT TURNOVER CYSTO (KITS) ×4 IMPLANT
MANIFOLD NEPTUNE II (INSTRUMENTS) ×4 IMPLANT
NDL SAFETY ECLIPSE 18X1.5 (NEEDLE) ×3 IMPLANT
NEEDLE HYPO 18GX1.5 SHARP (NEEDLE) ×4
PACK CYSTO AR (MISCELLANEOUS) ×4 IMPLANT
SET CYSTO W/LG BORE CLAMP LF (SET/KITS/TRAYS/PACK) ×4 IMPLANT
SHEATH NAVIGATOR HD 12/14X36 (SHEATH) ×4 IMPLANT
STENT URET 6FRX24 CONTOUR (STENTS) IMPLANT
STENT URET 6FRX26 CONTOUR (STENTS) ×2 IMPLANT
SURGILUBE 2OZ TUBE FLIPTOP (MISCELLANEOUS) ×4 IMPLANT
SYR TOOMEY IRRIG 70ML (MISCELLANEOUS) ×4
SYRINGE TOOMEY IRRIG 70ML (MISCELLANEOUS) ×3 IMPLANT
WATER STERILE IRR 1000ML POUR (IV SOLUTION) ×4 IMPLANT
WATER STERILE IRR 3000ML UROMA (IV SOLUTION) ×4 IMPLANT
WATER STERILE IRR 500ML POUR (IV SOLUTION) ×4 IMPLANT

## 2022-07-21 NOTE — Progress Notes (Signed)
Discharge instructions show for pt to take flomax and/or ditropan for bladder spasms, MD was notified for no prescription orders for pt to take. MD stated that it was ok, pt would not need it, if any concerns to call the office. Contnue to monitor

## 2022-07-21 NOTE — Discharge Instructions (Addendum)
You have a ureteral stent in place.  This is a tube that extends from your kidney to your bladder.  This may cause urinary bleeding, burning with urination, and urinary frequency.  Please call our office or present to the ED if you develop fevers >101 or pain which is not able to be controlled with oral pain medications.  You may be given either Flomax and/ or ditropan to help with bladder spasms and stent pain in addition to pain medications.    Please continue to hold Eliquis for the time being until your pathology results have resulted and we have a formalize plan on how to manage your urinary obstruction.  [facility] [address], [place],  [address] [phone]       AMBULATORY SURGERY  DISCHARGE INSTRUCTIONS   The drugs that you were given will stay in your system until tomorrow so for the next 24 hours you should not:  Drive an automobile Make any legal decisions Drink any alcoholic beverage   You may resume regular meals tomorrow.  Today it is better to start with liquids and gradually work up to solid foods.  You may eat anything you prefer, but it is better to start with liquids, then soup and crackers, and gradually work up to solid foods.   Please notify your doctor immediately if you have any unusual bleeding, trouble breathing, redness and pain at the surgery site, drainage, fever, or pain not relieved by medication.    Additional Instructions:        Please contact your physician with any problems or Same Day Surgery at [phone], Monday through Friday 6 am to 4 pm, or [place] at [facility] number at [phone].

## 2022-07-21 NOTE — Transfer of Care (Signed)
Immediate Anesthesia Transfer of Care Note  Patient: Eugene White  Procedure(s) Performed: CYSTOSCOPY WITH STENT EXCHANGE (Left) URETEROSCOPY (Left) URETERAL BIOPSY (Left: Ureter)  Patient Location: PACU  Anesthesia Type:General  Level of Consciousness: drowsy  Airway & Oxygen Therapy: Patient Spontanous Breathing and Patient connected to face mask oxygen  Post-op Assessment: Report given to RN and Post -op Vital signs reviewed and stable  Post vital signs: Reviewed and stable  Last Vitals:  Vitals Value Taken Time  BP 201/93 07/21/22 1639  Temp    Pulse 62 07/21/22 1642  Resp 18 07/21/22 1642  SpO2 100 % 07/21/22 1642  Vitals shown include unvalidated device data.  Last Pain:  Vitals:   07/21/22 1033  TempSrc: Tympanic  PainSc: 0-No pain         Complications: No notable events documented.

## 2022-07-21 NOTE — Interval H&P Note (Signed)
History and Physical Interval Note:  07/21/2022 3:10 PM  Eugene White  has presented today for surgery, with the diagnosis of Left Hydronephrosis, Gross Hematuria.  The various methods of treatment have been discussed with the patient and family. After consideration of risks, benefits and other options for treatment, the patient has consented to  Procedure(s): CYSTOSCOPY WITH STENT EXCHANGE (Left) URETEROSCOPY (Left) CYSTOSCOPY WITH BLADDER BIOPSY (N/A) as a surgical intervention.  The patient's history has been reviewed, patient examined, no change in status, stable for surgery.  I have reviewed the patient's chart and labs.  Questions were answered to the patient's satisfaction.    RRR CTAB  Left ureteral (upper tract urothelial biopsy)   [name]

## 2022-07-21 NOTE — Anesthesia Postprocedure Evaluation (Signed)
Anesthesia Post Note  Patient: Eugene White  Procedure(s) Performed: CYSTOSCOPY WITH STENT EXCHANGE (Left) URETEROSCOPY (Left) URETERAL BIOPSY (Left: Ureter)  Patient location during evaluation: PACU Anesthesia Type: General Level of consciousness: awake and alert Pain management: pain level controlled Vital Signs Assessment: post-procedure vital signs reviewed and stable Respiratory status: spontaneous breathing, nonlabored ventilation, respiratory function stable and patient connected to nasal cannula oxygen Cardiovascular status: blood pressure returned to baseline and stable Postop Assessment: no apparent nausea or vomiting Anesthetic complications: no   No notable events documented.   Last Vitals:  Vitals:   07/21/22 1724 07/21/22 1735  BP: (!) 180/83 (!) 178/88  Pulse:    Resp:    Temp:    SpO2:      Last Pain:  Vitals:   07/21/22 1720  TempSrc: Temporal  PainSc: 0-No pain                 Martha Clan

## 2022-07-21 NOTE — Anesthesia Preprocedure Evaluation (Addendum)
Anesthesia Evaluation  Patient identified by MRN, date of birth, ID band Patient awake    Reviewed: Allergy & Precautions, H&P , NPO status , Patient's Chart, lab work & pertinent test results, reviewed documented beta blocker date and time   History of Anesthesia Complications Negative for: history of anesthetic complications  Airway Mallampati: III  TM Distance: >3 FB Neck ROM: full    Dental  (+) Dental Advidsory Given, Poor Dentition   Pulmonary shortness of breath and with exertion, neg sleep apnea, neg COPD, neg recent URI, former smoker,    Pulmonary exam normal breath sounds clear to auscultation       Cardiovascular Exercise Tolerance: Good hypertension, (-) angina(-) Past MI and (-) Cardiac Stents + dysrhythmias Atrial Fibrillation + Valvular Problems/Murmurs  Rhythm:regular Rate:Normal     Neuro/Psych neg Seizures PSYCHIATRIC DISORDERS Depression negative neurological ROS     GI/Hepatic Neg liver ROS, GERD  ,  Endo/Other  neg diabetesHypothyroidism   Renal/GU negative Renal ROS  negative genitourinary   Musculoskeletal   Abdominal   Peds  Hematology negative hematology ROS (+)   Anesthesia Other Findings Patient with droppy right side of his face. Patient denied any history of stroke.   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, birth trauma     Comment:  right side of face droops No date: First degree AV block No date: GERD (gastroesophageal reflux disease) No date: GI symptom     Comment:  had nausea / vomited once / frequent stools / getting                better No date: Hyperlipidemia No date: Hypertension No date: Hypothyroidism No date: Mild carotid artery disease (HCC) No date: Mitral regurgitation No date: Mitral regurgitation No date: Mood changes No date: PAF (paroxysmal atrial fibrillation) (HCC) No date: Pneumonia     Comment:  hx of No date: Tricuspid regurgitation No date: Tricuspid regurgitation   Reproductive/Obstetrics negative OB ROS                            Anesthesia Physical  Anesthesia Plan  ASA: 3 and emergent  Anesthesia Plan: General   Post-op Pain Management:    Induction: Intravenous  PONV Risk Score and Plan: 2 and Ondansetron, Dexamethasone and Treatment may vary due to age or medical condition  Airway Management Planned: Oral ETT  Additional Equipment:   Intra-op Plan:   Post-operative Plan: Extubation in OR  Informed Consent: I have reviewed the patients History and Physical, chart, labs and discussed the procedure including the risks, benefits and alternatives for the proposed anesthesia with the patient or authorized representative who has indicated his/her understanding and acceptance.     Dental Advisory Given  Plan Discussed with: Anesthesiologist, CRNA and Surgeon  Anesthesia Plan Comments: (Patient consented for risks of anesthesia including but not limited to:  - adverse reactions to medications - damage to eyes, teeth, lips or other oral mucosa - nerve damage due to positioning  - sore throat or hoarseness - Damage to heart, brain, nerves, lungs, other parts of body or loss of life  Patient voiced understanding.)  Anesthesia Quick Evaluation  

## 2022-07-21 NOTE — Progress Notes (Signed)
Notified Dr. Rosey Bath that patient heart rate is sinus brady 54 and blood pressure is 174/84. Due to Sinus bradycardia at this time no further intervention ordered. Per Dr. Rosey Bath patient is okay.

## 2022-07-21 NOTE — Anesthesia Procedure Notes (Signed)
Procedure Name: Intubation Date/Time: 07/21/2022 3:45 PM  Performed by: Lia Foyer, CRNAPre-anesthesia Checklist: Patient identified, Emergency Drugs available, Suction available and Patient being monitored Patient Re-evaluated:Patient Re-evaluated prior to induction Oxygen Delivery Method: Circle system utilized Preoxygenation: Pre-oxygenation with 100% oxygen Induction Type: IV induction Ventilation: Mask ventilation without difficulty Laryngoscope Size: McGraph and 3 Grade View: Grade I Tube type: Oral Tube size: 7.0 mm Number of attempts: 1 Airway Equipment and Method: Stylet and Video-laryngoscopy Placement Confirmation: ETT inserted through vocal cords under direct vision, positive ETCO2 and breath sounds checked- equal and bilateral Secured at: 22 cm Tube secured with: Tape Dental Injury: Teeth and Oropharynx as per pre-operative assessment

## 2022-07-21 NOTE — Op Note (Signed)
Date of procedure: 07/21/22  Preoperative diagnosis:  Severe left hydronephrosis Hematuria  Postoperative diagnosis:  Same as above Left proximal/UPJ obstruction  Procedure: Cystoscopy Left diagnostic ureteroscopy Left ureteral stent exchange Left proximal ureteral biopsy Interpretation of fluoroscopy less than 30 minutes  Surgeon: [name], MD  Anesthesia: General  Complications: None  Intraoperative findings: Persistently severely dilated left renal pelvis with caliectasis with transition at the left proximal ureter, tortuosity and ureteral edema although no obvious mucosal or papillary lesions.  This area was biopsied.  EBL: Minimal  Specimens: Left ureteral biopsy times multiple  Drains: 6 x 28 French double-J ureteral stent on left  Indication: Eugene White is a 76 y.o. patient with personal history of severe left hydronephrosis with significant left upper tract hematuria status post evacuation of this and ureteral stent placement.  He returns today for staged procedure for diagnostic evaluation..  After reviewing the management options for treatment, he elected to proceed with the above surgical procedure(s). We have discussed the potential benefits and risks of the procedure, side effects of the proposed treatment, the likelihood of the patient achieving the goals of the procedure, and any potential problems that might occur during the procedure or recuperation. Informed consent has been obtained.  Description of procedure:  The patient was taken to the operating room and general anesthesia was induced.  The patient was placed in the dorsal lithotomy position, prepped and draped in the usual sterile fashion, and preoperative antibiotics were administered. A preoperative time-out was performed.   A 21 French scope was advanced per urethra to the bladder.  Attention was turned to the left side where the ureteral stent was seen emanating.  Stent graspers were used  to bring this to the level of the urethra.  Unfortunately, ended up removing the stent entirely.  I then introduced a 5 [place] open-ended ureteral catheter just within the UO to perform a left retrograde pyelogram.  This showed a fairly decompressed left ureter with a persistent significant transition at the left UPJ/left proximal ureter with a persistently massively dilated left renal pelvis with caliectasis.  The wire was then placed up to the level of the kidney.  I used a dual-lumen access sheath to introduce a second Super Stiff wire as a working wire.  A single channel 7 [place] digital ureteroscope was advanced over the Super Stiff wire up to the renal pelvis which actually went quite smoothly.  The wire was removed and the scope was alongside the safety wire.  I then visualization of a calyx which was dilated and capacious.  The renal pelvis was also capacious but there is no cyst papillary changes, underlying stones or tumors identified.  I then backed the scope into the proximal ureter/UPJ where it was fairly narrow, slightly torturous and edematous.  This was over about a centimeter in length.  Had some difficulty getting in and out of this area and had to readvanced a wire on several occasions but ultimately was able to traverse this more easily.  I ended up bringing him Parona forceps and tried to take a biopsy.  Unfortunately, the pieces were quite small and backing the scope down the length the ureter ended up losing the pieces.  I then readvanced the Super Stiff wire and ended up using a 35 cm ureteral access sheath to the mid ureter to help facilitate biopsies.  I ended up biopsying 5 or 6 times at this location however the biopsies were quite minuscule.  There was 1 larger  size biopsy which he felt was adequate.  Finally, the remainder of the ureter was directly visualized and aside from some mild edema from the stent, no papillary lesions tumors or obstructions were identified.  The bladder was  then drained.  The wire was backloaded over the rigid cystoscope.  A 6 x 28 French double-J ureteral stent was readvanced over the wire up to the level of the kidney.  Once the wire was removed, there was a coil noted within the renal pelvis as well as within the bladder.  The bladder was then drained.  The patient was cleaned and dried, repositioned to supine position, reversed myesthesia, taken the PACU in stable condition.  Plan: Intraoperative findings were discussed with his wife.  I also shared some pictures which were scanned to media.  Plan for the stent will be determined based on ureteral biopsies.  We will plan to discuss pathology early next week and potentially stent removal depending on findings.  We will have him hold his Eliquis for the time being until follow-up.  [name], M.D.

## 2022-07-22 ENCOUNTER — Encounter: Payer: Self-pay | Admitting: Urology

## 2022-07-23 LAB — SURGICAL PATHOLOGY

## 2022-07-28 DIAGNOSIS — D2272 Melanocytic nevi of left lower limb, including hip: Secondary | ICD-10-CM | POA: Diagnosis not present

## 2022-07-28 DIAGNOSIS — D1801 Hemangioma of skin and subcutaneous tissue: Secondary | ICD-10-CM | POA: Diagnosis not present

## 2022-07-28 DIAGNOSIS — C44311 Basal cell carcinoma of skin of nose: Secondary | ICD-10-CM | POA: Diagnosis not present

## 2022-07-28 DIAGNOSIS — D225 Melanocytic nevi of trunk: Secondary | ICD-10-CM | POA: Diagnosis not present

## 2022-07-28 DIAGNOSIS — L723 Sebaceous cyst: Secondary | ICD-10-CM | POA: Diagnosis not present

## 2022-07-28 DIAGNOSIS — D485 Neoplasm of uncertain behavior of skin: Secondary | ICD-10-CM | POA: Diagnosis not present

## 2022-07-29 ENCOUNTER — Encounter: Payer: Self-pay | Admitting: Urology

## 2022-07-29 ENCOUNTER — Ambulatory Visit (INDEPENDENT_AMBULATORY_CARE_PROVIDER_SITE_OTHER): Payer: Medicare HMO | Admitting: Urology

## 2022-07-29 VITALS — BP 172/83 | HR 65 | Ht 71.0 in | Wt 185.0 lb

## 2022-07-29 DIAGNOSIS — R310 Gross hematuria: Secondary | ICD-10-CM | POA: Diagnosis not present

## 2022-07-29 DIAGNOSIS — R319 Hematuria, unspecified: Secondary | ICD-10-CM

## 2022-07-29 DIAGNOSIS — N1330 Unspecified hydronephrosis: Secondary | ICD-10-CM

## 2022-07-29 NOTE — Progress Notes (Signed)
07/29/22 12:24 PM   Eugene White October 12, 1946 [id number]  Referring provider:  [name], PA-C [address] [place],  [place] [address] No chief complaint on file.    HPI: Eugene White is a 76 y.o.male  who presents today for possible stent removal.   CT real stone study on 06/18/2022 during ED visit visualized left severe hydronephrosis and proximal hydroureter with extensive left-sided perinephric fat stranding. He is s/p ureteral stent placement by me He was noted to have bloody urinary drainage after stent placement. He was started on ceftriaxone. Hematuria persisted throughout admission.  There appeared to be possibly obstruction in the mid proximal ureter.  There was massive hydronephrosis which a very large quantity of bloody urine was drained.  He did also experience acute blood loss anemia.   He underwent a CT abdomen and pelvis on 07/03/2022 that visualized moderate to severe left hydronephrosis with a double pigtail ureteral catheter in place. The degree of hydronephrosis is significantly decreased compared prior exam of June 18, 2022.  He is s/p left diagnostic ureteroscopy, stent exchange, ureteral biopsy, and cystoscopy. Intraoperative findings: Persistently severely dilated left renal pelvis with caliectasis with transition at the left proximal ureter, tortuosity and ureteral edema although no obvious mucosal or papillary lesions. Surgical pathology was negative for malignancy.   He returns today accompanied by his wife.  He has been doing well postop.  No issues with hematuria.  He is still not back on his blood thinners.  PMH: Past Medical History:  Diagnosis Date   Adult hypothyroidism 05/31/2009   Anemia    hx of one time   Arthritis    OA   Closed intertrochanteric fracture of hip, left, initial encounter ([place]) 10/10/2016   Colon, diverticulosis 05/31/2009   Difficulty sleeping    Diverticulitis large intestine 05/31/2009   Diverticulosis  2014   Facial nerve injury, 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   First degree AV block    GERD (gastroesophageal reflux disease)    GI symptom    had nausea / vomited once / frequent stools / getting better   Hyperlipidemia    Hypertension    Hypothyroidism    Mild carotid artery disease (HCC)    Mitral regurgitation    Mood changes    PAF (paroxysmal atrial fibrillation) (HCC)    Pneumonia    hx of   Tricuspid regurgitation    Tricuspid regurgitation     Surgical History: Past Surgical History:  Procedure Laterality Date   CARDIOVERSION N/A 01/12/2018   Procedure: CARDIOVERSION;  Surgeon: Dorothy Spark, MD;  Location: [facility];  Service: Cardiovascular;  Laterality: N/A;   CARDIOVERSION N/A 01/26/2019   Procedure: CARDIOVERSION;  Surgeon: Jerline Pain, MD;  Location[facility] ENDOSCOPY;  Service: Cardiovascular;  Laterality: N/A;   CARDIOVERSION N/A 09/30/2019   Procedure: CARDIOVERSION;  Surgeon: Lelon Perla, MD;  Location: [facility] ENDOSCOPY;  Service: Cardiovascular;  Laterality: N/A;   CARDIOVERSION N/A 04/04/2022   Procedure: CARDIOVERSION;  Surgeon: Janina Mayo, MD;  Location: [facility] ENDOSCOPY;  Service: Cardiovascular;  Laterality: N/A;   COLONOSCOPY  2014   CYSTOSCOPY WITH STENT PLACEMENT Left 06/18/2022   Procedure: CYSTOSCOPY WITH STENT PLACEMENT;  Surgeon: [name], MD;  Location: [facility]S;  Service: Urology;  Laterality: Left;   CYSTOSCOPY WITH STENT PLACEMENT Left 07/21/2022   Procedure: CYSTOSCOPY WITH STENT EXCHANGE;  Surgeon: [name], MD;  Location: [facility];  Service: Urology;  Laterality: Left;  FEMUR IM NAIL Left 10/11/2016   Procedure: INTRAMEDULLARY (IM) NAIL FEMORAL;  Surgeon: [name], MD;  Location: [facility];  Service: Orthopedics;  Laterality: Left;   HAND SURGERY   1973 / 2010 / 2015   right to release tendons   HARDWARE REMOVAL Left 10/05/2017   Procedure: Removal of left femoral nail;  Surgeon: [name], MD;  Location:  [facility];  Service: Orthopedics;  Laterality: Left;  90 mins   HEMATOMA EVACUATION Left 01/19/2018   Procedure: Irrigation and debridement, Evacuation of left total knee hematoma;  Surgeon: [name], MD;  Location: [facility];  Service: Orthopedics;  Laterality: Left;   KNEE ARTHROSCOPY  1984 & 2001   twice   NASAL SEPTUM SURGERY     removal Left femoral nail      10/05/17 Dr. [name]   SHOULDER OPEN ROTATOR CUFF REPAIR  1999 & 2006   Right and left   TEE WITH CARDIOVERSION  01/12/2018   TEE WITHOUT CARDIOVERSION N/A 01/12/2018   Procedure: TRANSESOPHAGEAL ECHOCARDIOGRAM (TEE);  Surgeon: Dorothy Spark, MD;  Location: [facility] ENDOSCOPY;  Service: Cardiovascular;  Laterality: N/A;   TONSILLECTOMY     TOTAL KNEE ARTHROPLASTY Right 01/09/2015   Procedure: RIGHT TOTAL KNEE ARTHROPLASTY;  Surgeon: [name], MD;  Location: [facility];  Service: Orthopedics;  Laterality: Right;   TOTAL KNEE ARTHROPLASTY Left 11/23/2017   Procedure: LEFT TOTAL KNEE ARTHROPLASTY;  Surgeon: [name], MD;  Location: [facility];  Service: Orthopedics;  Laterality: Left;  90 mins   URETERAL BIOPSY Left 07/21/2022   Procedure: URETERAL BIOPSY;  Surgeon: [name], MD;  Location: [facility];  Service: Urology;  Laterality: Left;   URETEROSCOPY Left 07/21/2022   Procedure: URETEROSCOPY;  Surgeon: [name], MD;  Location: [facility];  Service: Urology;  Laterality: Left;    Home Medications:  Allergies as of 07/29/2022       Reactions   Sulfa Antibiotics Other (See Comments)   Unknown/childhood allergy unknown        Medication List        Accurate as of July 29, 2022 12:24 PM. If you have any questions, ask your nurse or doctor.          amoxicillin 500 MG capsule Commonly known as: AMOXIL Take 4 capsules by mouth as directed. Before dental appointment   atorvastatin 20 MG tablet Commonly known as: LIPITOR Take 1 tablet (20 mg total) by mouth daily.   BLUE-EMU MAXIMUM STRENGTH EX Apply 1  application  topically at bedtime as needed (pain).   diphenhydramine-acetaminophen 25-500 MG Tabs tablet Commonly known as: TYLENOL PM Take 4 tablets by mouth at bedtime as needed (sleep).   Eliquis 5 MG Tabs tablet Generic drug: apixaban Take 5 mg by mouth 2 (two) times daily. On hold   ferrous sulfate 325 (65 FE) MG EC tablet TAKE 1 TABLET BY MOUTH EVERY DAY WITH BREAKFAST   ibuprofen 200 MG tablet Commonly known as: ADVIL Take 800 mg by mouth daily as needed for moderate pain.   levothyroxine 100 MCG tablet Commonly known as: SYNTHROID TAKE 1 TABLET BY MOUTH EVERY DAY   magnesium oxide 400 (240 Mg) MG tablet Commonly known as: MAG-OX TAKE 1 TABLET TWICE DAILY .   metoprolol tartrate 25 MG tablet Commonly known as: LOPRESSOR Take 1 tablet (25 mg total) by mouth 2 (two) times daily.   multivitamin with minerals Tabs tablet Take 1 tablet by mouth daily.   pantoprazole 40 MG tablet Commonly  known as: PROTONIX Take 1 tablet (40 mg total) by mouth daily.   potassium chloride SA 20 MEQ tablet Commonly known as: Klor-Con M20 Take 2 tablets (40 mEq total) by mouth daily.   PreserVision AREDS 2 Caps Take 1 capsule by mouth 2 (two) times daily.   venlafaxine XR 75 MG 24 hr capsule Commonly known as: EFFEXOR-XR TAKE 1 CAPSULE EVERY DAY (NEED MD APPOINTMENT FOR REFILLS)   Vitamin D 50 MCG (2000 UT) Caps Take 2,000 Units by mouth every evening.        Allergies:  Allergies  Allergen Reactions   Sulfa Antibiotics Other (See Comments)    Unknown/childhood allergy unknown    Family History: Family History  Problem Relation Age of Onset   Breast cancer Mother    Colon cancer Neg Hx    Esophageal cancer Neg Hx    Stomach cancer Neg Hx    Rectal cancer Neg Hx     Social History:  reports that he quit smoking about 53 years ago. His smoking use included cigarettes. He has never used smokeless tobacco. He reports current alcohol use of about 12.0 standard drinks  of alcohol per week. He reports that he does not use drugs.   Physical Exam: BP (!) 172/83   Pulse 65   Ht '5\' 11"'$  (1.803 m)   Wt 185 lb (83.9 kg)   BMI 25.80 kg/m   Constitutional:  Alert and oriented, No acute distress. HEENT: [place] AT, moist mucus membranes.  Trachea midline, no masses. Cardiovascular: No clubbing, cyanosis, or edema. Respiratory: Normal respiratory effort, no increased work of breathing. Skin: No rashes, bruises or suspicious lesions. Neurologic: Grossly intact, no focal deficits, moving all 4 extremities. Psychiatric: Normal mood and affect.  Laboratory Data:  Lab Results  Component Value Date   CREATININE 0.96 07/18/2022   Lab Results  Component Value Date   HGBA1C 5.1 02/14/2022   Pertinent Imaging: CLINICAL DATA:  Gross hematuria   EXAM: CT ABDOMEN AND PELVIS WITH CONTRAST   TECHNIQUE: Multidetector CT imaging of the abdomen and pelvis was performed using the standard protocol following bolus administration of intravenous contrast.   RADIATION DOSE REDUCTION: This exam was performed according to the departmental dose-optimization program which includes automated exposure control, adjustment of the mA and/or kV according to patient size and/or use of iterative reconstruction technique.   CONTRAST:  137m OMNIPAQUE IOHEXOL 300 MG/ML  SOLN   COMPARISON:  June 18, 2022, June 05, 2014   FINDINGS: Lower chest: No acute abnormality.   Hepatobiliary: Multiple simple cysts are identified throughout the liver unchanged compared prior exams. In the inferior left lobe liver, there is a 2.4 cm hypodense lesion probably a complicated cyst unchanged compared prior exam of 2015. The gallbladder and biliary tree are normal.   Pancreas: Unremarkable. No pancreatic ductal dilatation or surrounding inflammatory changes.   Spleen: Normal in size without focal abnormality.   Adrenals/Urinary Tract: The bilateral adrenal glands are normal. The right kidney  is normal without hydronephrosis. There is left hydronephrosis with a double pigtail ureteral catheter in place. The degree of hydronephrosis is significantly decreased compared prior exam of June 18, 2022. The bladder is normal.   Stomach/Bowel: Moderate hiatal hernia is identified. The stomach is otherwise normal. There is no small bowel obstruction. There is diverticulosis of colon without diverticulitis. The appendix is normal.   Vascular/Lymphatic: Aortic atherosclerosis. No enlarged abdominal or pelvic lymph nodes.   Reproductive: Prostate is stable.   Other: Small umbilical herniation  of mesenteric fat is identified.   Musculoskeletal: Stable degenerative joint changes are identified throughout the spine.   IMPRESSION: Moderate to severe left hydronephrosis with a double pigtail ureteral catheter in place. The degree of hydronephrosis is significantly decreased compared prior exam of June 18, 2022.   Aortic Atherosclerosis (ICD10-I70.0).     Electronically Signed   By: Abelardo Diesel M.D.   On: 07/04/2022 13:18   I have personally reviewed the images and agree with radiologist interpretation.      Assessment & Plan:   Left hydronephrosis  - no evidence of intrinsic obstruction, possibly related to secondary proximal ureteral obstruction of unclear etiology - Pathology negative for malignancy is reassuring - Likely chronic based on degree of cortical thinning ad hydronephrosis  -Other upper tract pathology or intra-abdominal/retroperitoneal issues identified on CT scan to explain the obstruction, not present on imaging in 2015 - We discussed that it may be helpful to assess overall renal function to decide conservative management versus maintaining stent. We discussed the risk of both  - Will have him undergo a lasix renal study to evaluate kidney function (merrily interested in split function) -Because of his kidney has very little function, options include  removing stent with conservative management versus maintaining stent to preserve all nephrons.  Alternatively, if renal function is significant, would more strongly favor maintaining stent with serial exchanges.  2. Gross hematuria  - In absence of intrinsic obstruction and negative pathology it is okay to resume anticoagulation  We will plan to call with renal scan results with f/u TBD results  Conley Rolls as a scribe for [name], MD.,have documented all relevant documentation on the behalf of [name], MD,as directed by  [name], MD while in the presence of [name], [place] [address] [place], [place] [address] [phone]  I spent 32 total minutes on the day of the encounter including pre-visit review of the medical record, face-to-face time with the patient, and post visit ordering of labs/imaging/tests.

## 2022-08-18 ENCOUNTER — Ambulatory Visit
Admission: RE | Admit: 2022-08-18 | Discharge: 2022-08-18 | Disposition: A | Discharge status: Home / Self Care | Payer: Medicare HMO | Source: Ambulatory Visit | Attending: Urology | Admitting: Urology

## 2022-08-18 DIAGNOSIS — N1330 Unspecified hydronephrosis: Secondary | ICD-10-CM | POA: Insufficient documentation

## 2022-08-18 DIAGNOSIS — R319 Hematuria, unspecified: Secondary | ICD-10-CM | POA: Diagnosis not present

## 2022-08-18 MED ORDER — TECHNETIUM TC 99M MERTIATIDE
5.0000 | Freq: Once | INTRAVENOUS | Status: AC
  Administered 2022-08-18: 5.44 via INTRAVENOUS

## 2022-08-18 MED ORDER — FUROSEMIDE 10 MG/ML IJ SOLN
42.0000 mg | Freq: Once | INTRAMUSCULAR | Status: AC
  Administered 2022-08-18: 42 mg via INTRAVENOUS
  Filled 2022-08-18: qty 4.2

## 2022-08-19 DIAGNOSIS — H52223 Regular astigmatism, bilateral: Secondary | ICD-10-CM | POA: Diagnosis not present

## 2022-08-19 DIAGNOSIS — H40003 Preglaucoma, unspecified, bilateral: Secondary | ICD-10-CM | POA: Diagnosis not present

## 2022-08-21 ENCOUNTER — Other Ambulatory Visit: Payer: Self-pay

## 2022-08-21 DIAGNOSIS — E039 Hypothyroidism, unspecified: Secondary | ICD-10-CM

## 2022-08-21 DIAGNOSIS — D649 Anemia, unspecified: Secondary | ICD-10-CM

## 2022-08-22 LAB — CBC WITH DIFFERENTIAL/PLATELET
Basophils Absolute: 0 10*3/uL (ref 0.0–0.2)
Basos: 1 %
EOS (ABSOLUTE): 0.3 10*3/uL (ref 0.0–0.4)
Eos: 5 %
Hematocrit: 41.9 % (ref 37.5–51.0)
Hemoglobin: 14.3 g/dL (ref 13.0–17.7)
Immature Grans (Abs): 0 10*3/uL (ref 0.0–0.1)
Immature Granulocytes: 0 %
Lymphocytes Absolute: 1.6 10*3/uL (ref 0.7–3.1)
Lymphs: 31 %
MCH: 34 pg — ABNORMAL HIGH (ref 26.6–33.0)
MCHC: 34.1 g/dL (ref 31.5–35.7)
MCV: 100 fL — ABNORMAL HIGH (ref 79–97)
Monocytes Absolute: 0.6 10*3/uL (ref 0.1–0.9)
Monocytes: 13 %
Neutrophils Absolute: 2.5 10*3/uL (ref 1.4–7.0)
Neutrophils: 50 %
Platelets: 255 10*3/uL (ref 150–450)
RBC: 4.21 x10E6/uL (ref 4.14–5.80)
RDW: 12.2 % (ref 11.6–15.4)
WBC: 5 10*3/uL (ref 3.4–10.8)

## 2022-08-22 LAB — TSH+FREE T4
Free T4: 1.47 ng/dL (ref 0.82–1.77)
TSH: 0.612 u[IU]/mL (ref 0.450–4.500)

## 2022-08-25 ENCOUNTER — Other Ambulatory Visit: Payer: Self-pay | Admitting: Physician Assistant

## 2022-08-25 DIAGNOSIS — E039 Hypothyroidism, unspecified: Secondary | ICD-10-CM

## 2022-08-25 NOTE — Telephone Encounter (Signed)
Medication Refill - Medication: levothyroxine (SYNTHROID) 100 MCG tablet [[id number]]   Has the patient contacted their pharmacy? Yes.   (Agent: If no, request that the patient contact the pharmacy for the refill. If patient does not wish to contact the pharmacy document the reason why and proceed with request.) (Agent: If yes, when and what did the pharmacy advise?)  Preferred Pharmacy (with phone number or street name):  [place], [address]  [place] [address]  Phone: [phone] Fax: [phone]  Hours: Not open 24 hours   Has the patient been seen for an appointment in the last year OR does the patient have an upcoming appointment? Yes.    Agent: Please be advised that RX refills may take up to 3 business days. We ask that you follow-up with your pharmacy.

## 2022-08-26 MED ORDER — LEVOTHYROXINE SODIUM 100 MCG PO TABS: 100.0000 ug | ORAL_TABLET | Freq: Every day | ORAL | 3 refills | Status: DC

## 2022-08-26 NOTE — Telephone Encounter (Signed)
Requested Prescriptions  Pending Prescriptions Disp Refills  . levothyroxine (SYNTHROID) 100 MCG tablet 30 tablet 0    Sig: Take 1 tablet (100 mcg total) by mouth daily.     Endocrinology:  Hypothyroid Agents Passed - 08/25/2022 12:35 PM      Passed - TSH in normal range and within 360 days    TSH  Date Value Ref Range Status  08/21/2022 0.612 0.450 - 4.500 uIU/mL Final         Passed - Valid encounter within last 12 months    Recent Outpatient Visits          2 months ago Atrial fibrillation with rapid ventricular response [facility])   [facility] [name], PA-C   5 months ago Atrial fibrillation with controlled ventricular rate [facility])   [facility] [name], PA-C   6 months ago Hypothyroidism, unspecified type   [facility] [name], PA-C   1 year ago Primary hypertension   [facility], Vickki Muff, PA-C   3 years ago Chronic rhinitis   [place], PA-C      Future Appointments            In 3 days P[place], Greer Ee, MD [place]. [facility], [facility]   In 1 month Drubel, Ria Comment, [place], [facility]

## 2022-08-26 NOTE — Progress Notes (Deleted)
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   He initially underwent surgery for left femoral nail removal in 2018. During surgery he went into atrial fib with RVR which was new for him. 2-D echo showed normal LV function EF 55-60% with mildly dilated left atrium and mild LVH. He was started on Eliquis. He eventually underwent TEE/DCCV 12/2017 with conversion to NSR. TEE had shown moderate MR/TR as well. Since that time he has required additional DCCV in 12/2018 for recurrence. He has required medication adjustment since then. He was ultimately placed on flecainide in 08/2019 and underwent repeat DCCV 08/2019. He does not have prior ischemic assessment. I recommended ETT at [place] 12/2019 and the patient wished to defer. Prior carotid duplex 12/2017 showed 1-39% stenosis on the left, felt to be normal by Dr. [name]. At visit 12/2020 with Dr. [name] the patient requested to go off flecainide since he had not had any recurrent issues with atrial fibrillation. Repeat echo 12/2020 EF 55-60%, mild LAE, mild MR/TR, aortic sclerosis without stenosis.  Was last seen in clinic on 03/13/22 where he was feeling fatigued and unwell. Was in Afib at that time. He underwent successful DCCV on 04/04/22 with return to NSR.  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   First degree AV block    GERD (gastroesophageal reflux disease)    GI symptom    had nausea / vomited once / frequent stools / getting better   Hyperlipidemia    Hypertension    Hypothyroidism    Mild carotid artery disease (HCC)    Mitral regurgitation    Mood changes    PAF (paroxysmal atrial fibrillation) (HCC)    Pneumonia    hx of   Tricuspid regurgitation    Tricuspid regurgitation     Past Surgical History:  Procedure Laterality Date   CARDIOVERSION N/A 01/12/2018   Procedure: CARDIOVERSION;  Surgeon: Dorothy Spark, MD;  Location: [place];  Service: Cardiovascular;  Laterality: N/A;   CARDIOVERSION N/A 01/26/2019   Procedure: CARDIOVERSION;  Surgeon: Jerline Pain, MD;  Location: [facility] ENDOSCOPY;  Service: Cardiovascular;  Laterality: N/A;   CARDIOVERSION N/A 09/30/2019   Procedure: CARDIOVERSION;  Surgeon: Lelon Perla, MD;  Location: [facility] ENDOSCOPY;  Service: Cardiovascular;  Laterality: N/A;   CARDIOVERSION N/A 04/04/2022   Procedure: CARDIOVERSION;  Surgeon: Janina Mayo, MD;  Location: [facility] ENDOSCOPY;  Service: Cardiovascular;  Laterality: N/A;   COLONOSCOPY  2014   CYSTOSCOPY WITH STENT PLACEMENT Left 06/18/2022   Procedure: CYSTOSCOPY WITH STENT PLACEMENT;  Surgeon: [name], MD;  Location:  [facility] ORS;  Service: Urology;  Laterality: Left;   CYSTOSCOPY WITH STENT PLACEMENT Left 07/21/2022   Procedure: CYSTOSCOPY WITH STENT EXCHANGE;  Surgeon: [name], MD;  Location: [facility] ORS;  Service: Urology;  Laterality: Left;   FEMUR IM NAIL Left 10/11/2016   Procedure: INTRAMEDULLARY (IM) NAIL FEMORAL;  Surgeon: [name], MD;  Location: [facility] ORS;  Service: Orthopedics;  Laterality: Left;   HAND SURGERY   1973 / 2010 / 2015   right to release tendons   HARDWARE  REMOVAL Left 10/05/2017   Procedure: Removal of left femoral nail;  Surgeon: [name], MD;  Location: [facility] ORS;  Service: Orthopedics;  Laterality: Left;  90 mins   HEMATOMA EVACUATION Left 01/19/2018   Procedure: Irrigation and debridement, Evacuation of left total knee hematoma;  Surgeon: [name], MD;  Location: [facility] ORS;  Service: Orthopedics;  Laterality: Left;   KNEE ARTHROSCOPY  1984 & 2001   twice   NASAL SEPTUM SURGERY     removal Left femoral nail      10/05/17 Dr. [name]   SHOULDER OPEN ROTATOR CUFF REPAIR  1999 & 2006   Right and left   TEE WITH CARDIOVERSION  01/12/2018   TEE WITHOUT CARDIOVERSION N/A 01/12/2018   Procedure: TRANSESOPHAGEAL ECHOCARDIOGRAM (TEE);  Surgeon: Dorothy Spark, MD;  Location: [facility] ENDOSCOPY;  Service: Cardiovascular;  Laterality: N/A;   TONSILLECTOMY     TOTAL KNEE ARTHROPLASTY Right 01/09/2015   Procedure: RIGHT TOTAL KNEE ARTHROPLASTY;  Surgeon: [name], MD;  Location: [facility]S;  Service: Orthopedics;  Laterality: Right;   TOTAL KNEE ARTHROPLASTY Left 11/23/2017   Procedure: LEFT TOTAL KNEE ARTHROPLASTY;  Surgeon: [name], MD;  Location: [facility];  Service: Orthopedics;  Laterality: Left;  90 mins   URETERAL BIOPSY Left 07/21/2022   Procedure: URETERAL BIOPSY;  Surgeon: [name], MD;  Location: [facility] ORS;  Service: Urology;  Laterality: Left;   URETEROSCOPY Left 07/21/2022   Procedure: URETEROSCOPY;  Surgeon: [name], MD;  Location: [facility] ORS;  Service: Urology;  Laterality: Left;    Current Medications: No outpatient medications have been marked as taking for the 08/29/22 encounter (Appointment) with Freada Bergeron, MD.     Allergies:   Sulfa antibiotics   Social History   Socioeconomic History   Marital status: Married    Spouse name: Adela Lank   Number of children: 2   Years of education: Not on file   Highest education level: Professional school degree (e.g., MD, DDS, DVM, JD)  Occupational History    Occupation: retired, has degree in physiology  Tobacco Use   Smoking status: Former    Types: Cigarettes    Quit date: 12/29/1968    Years since quitting: 53.6   Smokeless tobacco: Never  Vaping Use   Vaping Use: Never used  Substance and Sexual Activity   Alcohol use: Yes    Alcohol/week: 12.0 standard drinks of alcohol    Types: 12 Cans of beer per week    Comment: weekly   Drug use: No   Sexual activity: Yes  Other Topics Concern   Not on file  Social History Narrative   Pt lives in [place] w/ wife.   Social Determinants of Health   Financial Resource Strain: Low Risk  (10/08/2018)   Overall Financial Resource Strain (CARDIA)    Difficulty of Paying Living Expenses: Not hard at all  Food Insecurity: No Food Insecurity (10/08/2018)   Hunger Vital Sign    Worried About Running Out  of Food in the Last Year: Never true    [place] in the Last Year: Never true  Transportation Needs: No Transportation Needs (10/08/2018)   PRAPARE - Hydrologist (Medical): No    Lack of Transportation (Non-Medical): No  Physical Activity: Inactive (10/11/2019)   Exercise Vital Sign    Days of Exercise per Week: 0 days    Minutes of Exercise per Session: 0 min  Stress: No Stress Concern Present (10/08/2018)   [place]    Feeling of Stress : Not at all  Social Connections: Unknown (10/08/2018)   Social Connection and Isolation Panel [NHANES]    Frequency of Communication with Friends and Family: Patient refused    Frequency of Social Gatherings with Friends and Family: Patient refused    Attends Religious Services: Patient refused    Marine scientist or Organizations: Patient refused    Attends Archivist Meetings: Patient refused    Marital Status: Patient refused     Family History: The patient's family history includes Breast cancer in his mother. There is no history  of Colon cancer, Esophageal cancer, Stomach cancer, or Rectal cancer.  ROS:   Review of Systems  Constitutional:  Positive for malaise/fatigue. Negative for chills and fever.  HENT:  Negative for ear pain and nosebleeds.   Eyes:  Negative for blurred vision and photophobia.  Respiratory:  Positive for shortness of breath. Negative for hemoptysis and sputum production.   Cardiovascular:  Positive for palpitations. Negative for chest pain, orthopnea, claudication, leg swelling and PND.  Gastrointestinal:  Negative for constipation and vomiting.  Genitourinary:  Positive for frequency. Negative for hematuria.  Musculoskeletal:  Negative for back pain and falls.  Neurological:  Negative for tremors and loss of consciousness.  Endo/Heme/Allergies:  Negative for environmental allergies.  Psychiatric/Behavioral:  Negative for depression. The patient has insomnia.      EKGs/Labs/Other Studies Reviewed:    The following studies were reviewed today:  2D echo 12/2020  1. Left ventricular ejection fraction, by estimation, is 55 to 60%. The  left ventricle has normal function. The left ventricle has no regional  wall motion abnormalities. Left ventricular diastolic function could not  be evaluated.   2. Right ventricular systolic function is normal. The right ventricular  size is normal.   3. Left atrial size was mildly dilated.   4. The mitral valve is normal in structure. Mild mitral valve  regurgitation. No evidence of mitral stenosis.   5. The aortic valve is normal in structure. There is mild calcification  of the aortic valve. There is moderate thickening of the aortic valve.  Aortic valve regurgitation is not visualized. Mild to moderate aortic  valve sclerosis/calcification is  present, without any evidence of aortic stenosis.   6. The inferior vena cava is normal in size with greater than 50%  respiratory variability, suggesting right atrial pressure of 3 mmHg.   Comparison(s): No  significant change from prior study.    TEE 12/2017 - Left ventricle: Systolic function was normal. The estimated    ejection fraction was in the range of 55% to 60%. Wall motion was    normal; there were no regional wall motion abnormalities.  - Aortic valve: There was mild regurgitation.  - Mitral valve: There was moderate regurgitation.  - Left atrium: The atrium was dilated. No evidence of thrombus in    the atrial cavity or appendage.  No evidence of thrombus in the    atrial cavity or appendage. No evidence of thrombus in the atrial    cavity or appendage.  - Right atrium: The atrium was dilated. No evidence of thrombus in    the atrial cavity or appendage.  - Atrial septum: No defect or patent foramen ovale was identified.  - Tricuspid valve: There was moderate regurgitation.   Impressions:   - No cardiac source of emboli was indentified. The study was    followed by a successful cardioversion.    Carotid study 12/2017 Final Interpretation:     Right Carotid: There was no evidence of thrombus, dissection,  atherosclerotic plaque or stenosis in the cervical carotid system.   Left Carotid: Velocities in the left ICA are consistent with a 1-39%  stenosis.   Vertebrals:  Both vertebral arteries were patent with antegrade flow.  Subclavians: Normal flow hemodynamics were seen in bilateral subclavian arteries.   *See table(s) above for measurements and observations.      EKG:  EKG is personally reviewed. 03/25/2022: Afib with HR 73 03/13/2022: Atrial fibrillation. Rate 66 bpm. One PVC.   Recent Labs: 01/23/2022: Magnesium 2.0 02/25/2022: ALT 22; B Natriuretic Peptide 266.4 07/18/2022: BUN 11; Creatinine, Ser 0.96; Potassium 3.7; Sodium 138 08/21/2022: Hemoglobin 14.3; Platelets 255; TSH 0.612   Recent Lipid Panel    Component Value Date/Time   CHOL 155 01/23/2022 1440   TRIG 116 01/23/2022 1440   HDL 66 01/23/2022 1440   CHOLHDL 2.3 01/23/2022 1440   CHOLHDL 3.8  10/11/2016 0337   VLDL UNABLE TO CALCULATE IF TRIGLYCERIDE OVER 400 mg/dL 10/11/2016 0337   LDLCALC 69 01/23/2022 1440     Risk Assessment/Calculations:    CHA2DS2-VASc Score = 3  This indicates a 3.2% annual risk of stroke. The patient's score is based upon: CHF History: 0 HTN History: 1 Diabetes History: 0 Stroke History: 0 Vascular Disease History: 0 Age Score: 2 Gender Score: 0          Physical Exam:    VS:  There were no vitals taken for this visit.    Wt Readings from Last 3 Encounters:  07/29/22 185 lb (83.9 kg)  07/21/22 185 lb (83.9 kg)  07/09/22 185 lb (83.9 kg)     GEN: Well nourished, well developed in no acute distress HEENT: Normal NECK: No JVD; No carotid bruits CARDIAC: Irregularly irregular, no murmurs, rubs, gallops RESPIRATORY:  Clear to auscultation without rales, wheezing or rhonchi  ABDOMEN: Soft, non-tender, non-distended MUSCULOSKELETAL:  No edema; No deformity  SKIN: Warm and dry NEUROLOGIC:  Alert and oriented x 3 PSYCHIATRIC:  Normal affect   ASSESSMENT:    No diagnosis found.   PLAN:    In order of problems listed above:  #Paroxysmal Afib: S/p DCCV on 03/2022 now maintaining NSR. Will continue apixaban for AC and metop for rate control. 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 -Continue serial monitoring  #HTN: -Continue metoprolol '25mg'$  BID  #Mild Carotid Artery Disease: -Continue lipitor '20mg'$  daily     Follow-up:   6 months.  Medication Adjustments/Labs and Tests Ordered: Current medicines are reviewed at length with the patient today.  Concerns regarding medicines are outlined above.   No orders of the defined types were placed in this encounter.  No orders of the defined types were placed in this encounter.  There are no Patient  Instructions on file for this visit.   I,Mathew  Stumpf,acting as a Education administrator for Freada Bergeron, MD.,have documented all relevant documentation on the behalf of Freada Bergeron, MD,as directed by  Freada Bergeron, MD while in the presence of Freada Bergeron, MD.  I, Freada Bergeron, MD, have reviewed all documentation for this visit. The documentation on 08/26/22 for the exam, diagnosis, procedures, and orders are all accurate and complete.   Signed, Freada Bergeron, MD  08/26/2022 8:21 PM    [place]

## 2022-08-27 ENCOUNTER — Other Ambulatory Visit: Payer: Self-pay

## 2022-08-27 DIAGNOSIS — I480 Paroxysmal atrial fibrillation: Secondary | ICD-10-CM

## 2022-08-27 MED ORDER — METOPROLOL TARTRATE 25 MG PO TABS: 25.0000 mg | ORAL_TABLET | Freq: Two times a day (BID) | ORAL | 2 refills | Status: DC

## 2022-08-29 ENCOUNTER — Encounter: Payer: Self-pay | Admitting: Cardiology

## 2022-08-29 ENCOUNTER — Ambulatory Visit: Payer: Medicare HMO | Attending: Cardiology | Admitting: Cardiology

## 2022-08-29 VITALS — BP 138/74 | HR 52 | Ht 71.0 in | Wt 186.0 lb

## 2022-08-29 DIAGNOSIS — I779 Disorder of arteries and arterioles, unspecified: Secondary | ICD-10-CM | POA: Diagnosis not present

## 2022-08-29 DIAGNOSIS — I071 Rheumatic tricuspid insufficiency: Secondary | ICD-10-CM

## 2022-08-29 DIAGNOSIS — N1330 Unspecified hydronephrosis: Secondary | ICD-10-CM

## 2022-08-29 DIAGNOSIS — I480 Paroxysmal atrial fibrillation: Secondary | ICD-10-CM

## 2022-08-29 DIAGNOSIS — I10 Essential (primary) hypertension: Secondary | ICD-10-CM | POA: Diagnosis not present

## 2022-08-29 DIAGNOSIS — R319 Hematuria, unspecified: Secondary | ICD-10-CM | POA: Diagnosis not present

## 2022-08-29 MED ORDER — APIXABAN 5 MG PO TABS: 5.0000 mg | ORAL_TABLET | Freq: Two times a day (BID) | ORAL | 3 refills | Status: DC

## 2022-08-29 NOTE — Progress Notes (Signed)
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   He initially underwent surgery for left femoral nail removal in 2018. During surgery he went into atrial fib with RVR which was new for him. 2-D echo showed normal LV function EF 55-60% with mildly dilated left atrium and mild LVH. He was started on Eliquis. He eventually underwent TEE/DCCV 12/2017 with conversion to NSR. TEE had shown moderate MR/TR as well. Since that time he has required additional DCCV in 12/2018 for recurrence. He has required medication adjustment since then. He was ultimately placed on flecainide in 08/2019 and underwent repeat DCCV 08/2019. He does not have prior ischemic assessment. I recommended ETT at [place] 12/2019 and the patient wished to defer. Prior carotid duplex 12/2017 showed 1-39% stenosis on the left, felt to be normal by Dr. [name]. At visit 12/2020 with Dr. [name] the patient requested to go off flecainide since he had not had any recurrent issues with atrial fibrillation. Repeat echo 12/2020 EF 55-60%, mild LAE, mild MR/TR, aortic sclerosis without stenosis.  Was last seen in clinic on 03/13/22 where he was feeling fatigued and unwell. Was in Afib at that time. He underwent successful DCCV on 04/04/22 with return to NSR.  Today, the patient feels well. Denies CP, palpitations, SOB, syncopal symptoms. Was evaluated in the ED recently for urological concerns. Specifically he presented to the ER with left flank pain he thought was related to a kidney  stone. He was found to have significant left sided hydronephrosis with ureteral stenosis s/p stent placement and renal bleeding from recent fall. HgB dropped to 8. States he in the hospital he was going back and forth between AF and NSR and eventually ended up in NSR. Has recovered well from procedure. HgB back to 14. He is not on Eliquis currently due hematuria post-procedure. Asking if he should resume it today.  Otherwise, doing well from CV standpoint. No chest pain, palpitations, SOB, LE edema or orthopnea.Tolerating meds without issue.  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   First degree AV block    GERD (gastroesophageal reflux disease)    GI symptom    had nausea / vomited once / frequent stools / getting better   Hyperlipidemia    Hypertension    Hypothyroidism    Mild carotid artery disease (HCC)    Mitral regurgitation    Mood changes    PAF (paroxysmal atrial fibrillation) (HCC)    Pneumonia    hx of   Tricuspid regurgitation    Tricuspid regurgitation     Past Surgical History:  Procedure Laterality Date  CARDIOVERSION N/A 01/12/2018   Procedure: CARDIOVERSION;  Surgeon: Dorothy Spark, MD;  Location: [place];  Service: Cardiovascular;  Laterality: N/A;   CARDIOVERSION N/A 01/26/2019   Procedure: CARDIOVERSION;  Surgeon: Jerline Pain, MD;  Location: [facility] ENDOSCOPY;  Service: Cardiovascular;  Laterality: N/A;   CARDIOVERSION N/A 09/30/2019   Procedure: CARDIOVERSION;  Surgeon: Lelon Perla, MD;  Location: [facility] ENDOSCOPY;  Service: Cardiovascular;  Laterality: N/A;   CARDIOVERSION N/A 04/04/2022    Procedure: CARDIOVERSION;  Surgeon: Janina Mayo, MD;  Location: [facility] ENDOSCOPY;  Service: Cardiovascular;  Laterality: N/A;   COLONOSCOPY  2014   CYSTOSCOPY WITH STENT PLACEMENT Left 06/18/2022   Procedure: CYSTOSCOPY WITH STENT PLACEMENT;  Surgeon: [name], MD;  Location: [facility]S;  Service: Urology;  Laterality: Left;   CYSTOSCOPY WITH STENT PLACEMENT Left 07/21/2022   Procedure: CYSTOSCOPY WITH STENT EXCHANGE;  Surgeon: [name], MD;  Location: [facility];  Service: Urology;  Laterality: Left;   FEMUR IM NAIL Left 10/11/2016   Procedure: INTRAMEDULLARY (IM) NAIL FEMORAL;  Surgeon: [name], MD;  Location: [facility];  Service: Orthopedics;  Laterality: Left;   HAND SURGERY   1973 / 2010 / 2015   right to release tendons   HARDWARE REMOVAL Left 10/05/2017   Procedure: Removal of left femoral nail;  Surgeon: [name], MD;  Location: [facility];  Service: Orthopedics;  Laterality: Left;  90 mins   HEMATOMA EVACUATION Left 01/19/2018   Procedure: Irrigation and debridement, Evacuation of left total knee hematoma;  Surgeon: [name], MD;  Location: [facility];  Service: Orthopedics;  Laterality: Left;   KNEE ARTHROSCOPY  1984 & 2001   twice   NASAL SEPTUM SURGERY     removal Left femoral nail      10/05/17 Dr. [name]   SHOULDER OPEN ROTATOR CUFF REPAIR  1999 & 2006   Right and left   TEE WITH CARDIOVERSION  01/12/2018   TEE WITHOUT CARDIOVERSION N/A 01/12/2018   Procedure: TRANSESOPHAGEAL ECHOCARDIOGRAM (TEE);  Surgeon: Dorothy Spark, MD;  Location: [facility] ENDOSCOPY;  Service: Cardiovascular;  Laterality: N/A;   TONSILLECTOMY     TOTAL KNEE ARTHROPLASTY Right 01/09/2015   Procedure: RIGHT TOTAL KNEE ARTHROPLASTY;  Surgeon: [name], MD;  Location: [facility];  Service: Orthopedics;  Laterality: Right;   TOTAL KNEE ARTHROPLASTY Left 11/23/2017   Procedure: LEFT TOTAL KNEE ARTHROPLASTY;  Surgeon: [name], MD;  Location: [facility];  Service: Orthopedics;  Laterality: Left;  90  mins   URETERAL BIOPSY Left 07/21/2022   Procedure: URETERAL BIOPSY;  Surgeon: [name], MD;  Location: [facility];  Service: Urology;  Laterality: Left;   URETEROSCOPY Left 07/21/2022   Procedure: URETEROSCOPY;  Surgeon: [name], MD;  Location: [facility];  Service: Urology;  Laterality: Left;    Current Medications: Current Meds  Medication Sig   amoxicillin (AMOXIL) 500 MG capsule Take 4 capsules by mouth as directed. Before dental appointment   atorvastatin (LIPITOR) 20 MG tablet Take 1 tablet (20 mg total) by mouth daily.   Cholecalciferol (VITAMIN D) 2000 UNITS CAPS Take 2,000 Units by mouth every evening.    diphenhydramine-acetaminophen (TYLENOL PM) 25-500 MG TABS tablet Take 4 tablets by mouth at bedtime as needed (sleep).   ibuprofen (ADVIL) 200 MG tablet Take 800 mg by mouth daily as needed for moderate pain.   levothyroxine (SYNTHROID) 100 MCG tablet Take 1 tablet (100 mcg total) by mouth daily.   magnesium oxide (MAG-OX) 400 (240 Mg) MG tablet  TAKE 1 TABLET TWICE DAILY .   Menthol, Topical Analgesic, (BLUE-EMU MAXIMUM STRENGTH EX) Apply 1 application  topically at bedtime as needed (pain).   metoprolol tartrate (LOPRESSOR) 25 MG tablet Take 1 tablet (25 mg total) by mouth 2 (two) times daily.   Multiple Vitamin (MULTIVITAMIN WITH MINERALS) TABS tablet Take 1 tablet by mouth daily.    Multiple Vitamins-Minerals (PRESERVISION AREDS 2) CAPS Take 1 capsule by mouth 2 (two) times daily.    pantoprazole (PROTONIX) 40 MG tablet Take 1 tablet (40 mg total) by mouth daily.   potassium chloride SA (KLOR-CON M20) 20 MEQ tablet Take 2 tablets (40 mEq total) by mouth daily.   venlafaxine XR (EFFEXOR-XR) 75 MG 24 hr capsule TAKE 1 CAPSULE EVERY DAY (NEED MD APPOINTMENT FOR REFILLS)   [DISCONTINUED] apixaban (ELIQUIS) 5 MG TABS tablet Take 5 mg by mouth 2 (two) times daily. On hold   [DISCONTINUED] ferrous sulfate 325 (65 FE) MG EC tablet TAKE 1 TABLET BY MOUTH EVERY DAY WITH  BREAKFAST     Allergies:   Sulfa antibiotics   Social History   Socioeconomic History   Marital status: Married    Spouse name: Production manager   Number of children: 2   Years of education: Not on file   Highest education level: Professional school degree (e.g., MD, DDS, DVM, JD)  Occupational History   Occupation: retired, has degree in physiology  Tobacco Use   Smoking status: Former    Types: Cigarettes    Quit date: 12/29/1968    Years since quitting: 53.7   Smokeless tobacco: Never  Vaping Use   Vaping Use: Never used  Substance and Sexual Activity   Alcohol use: Yes    Alcohol/week: 12.0 standard drinks of alcohol    Types: 12 Cans of beer per week    Comment: weekly   Drug use: No   Sexual activity: Yes  Other Topics Concern   Not on file  Social History Narrative   Pt lives in [place] w/ wife.   Social Determinants of Health   Financial Resource Strain: Low Risk  (10/08/2018)   Overall Financial Resource Strain (CARDIA)    Difficulty of Paying Living Expenses: Not hard at all  Food Insecurity: No Food Insecurity (10/08/2018)   Hunger Vital Sign    Worried About Running Out of Food in the Last Year: Never true    Ran Out of Food in the Last Year: Never true  Transportation Needs: No Transportation Needs (10/08/2018)   PRAPARE - Hydrologist (Medical): No    Lack of Transportation (Non-Medical): No  Physical Activity: Inactive (10/11/2019)   Exercise Vital Sign    Days of Exercise per Week: 0 days    Minutes of Exercise per Session: 0 min  Stress: No Stress Concern Present (10/08/2018)   [place]    Feeling of Stress : Not at all  Social Connections: Unknown (10/08/2018)   Social Connection and Isolation Panel [NHANES]    Frequency of Communication with Friends and Family: Patient refused    Frequency of Social Gatherings with Friends and Family: Patient refused     Attends Religious Services: Patient refused    Marine scientist or Organizations: Patient refused    Attends Archivist Meetings: Patient refused    Marital Status: Patient refused     Family History: The patient's family history includes Breast cancer in his mother. There is no  history of Colon cancer, Esophageal cancer, Stomach cancer, or Rectal cancer.  ROS:   See HPI for ROS. Review of Systems  Constitutional:  Positive for malaise/fatigue. Negative for chills and fever.  HENT:  Negative for ear pain and nosebleeds.   Eyes:  Negative for blurred vision and photophobia.  Respiratory:  Positive for shortness of breath. Negative for hemoptysis and sputum production.   Cardiovascular:  Positive for palpitations. Negative for chest pain, orthopnea, claudication, leg swelling and PND.  Gastrointestinal:  Negative for constipation and vomiting.  Genitourinary:  Positive for frequency. Negative for hematuria.  Musculoskeletal:  Negative for back pain and falls.  Neurological:  Negative for tremors and loss of consciousness.  Endo/Heme/Allergies:  Negative for environmental allergies.  Psychiatric/Behavioral:  Negative for depression. The patient has insomnia.      EKGs/Labs/Other Studies Reviewed:    The following studies were reviewed today:  2D echo 12/2020  1. Left ventricular ejection fraction, by estimation, is 55 to 60%. The  left ventricle has normal function. The left ventricle has no regional  wall motion abnormalities. Left ventricular diastolic function could not  be evaluated.   2. Right ventricular systolic function is normal. The right ventricular  size is normal.   3. Left atrial size was mildly dilated.   4. The mitral valve is normal in structure. Mild mitral valve  regurgitation. No evidence of mitral stenosis.   5. The aortic valve is normal in structure. There is mild calcification  of the aortic valve. There is moderate thickening of the aortic  valve.  Aortic valve regurgitation is not visualized. Mild to moderate aortic  valve sclerosis/calcification is  present, without any evidence of aortic stenosis.   6. The inferior vena cava is normal in size with greater than 50%  respiratory variability, suggesting right atrial pressure of 3 mmHg.   Comparison(s): No significant change from prior study.    TEE 12/2017 - Left ventricle: Systolic function was normal. The estimated    ejection fraction was in the range of 55% to 60%. Wall motion was    normal; there were no regional wall motion abnormalities.  - Aortic valve: There was mild regurgitation.  - Mitral valve: There was moderate regurgitation.  - Left atrium: The atrium was dilated. No evidence of thrombus in    the atrial cavity or appendage. No evidence of thrombus in the    atrial cavity or appendage. No evidence of thrombus in the atrial    cavity or appendage.  - Right atrium: The atrium was dilated. No evidence of thrombus in    the atrial cavity or appendage.  - Atrial septum: No defect or patent foramen ovale was identified.  - Tricuspid valve: There was moderate regurgitation.   Impressions:   - No cardiac source of emboli was indentified. The study was    followed by a successful cardioversion.    Carotid study 12/2017 Final Interpretation:     Right Carotid: There was no evidence of thrombus, dissection,  atherosclerotic plaque or stenosis in the cervical carotid system.   Left Carotid: Velocities in the left ICA are consistent with a 1-39%  stenosis.   Vertebrals:  Both vertebral arteries were patent with antegrade flow.  Subclavians: Normal flow hemodynamics were seen in bilateral subclavian arteries.   *See table(s) above for measurements and observations.      EKG:  EKG is personally reviewed. 08/29/22: NSR, first degree AVB, PAC 03/25/2022: Afib with HR 73 03/13/2022: Atrial fibrillation. Rate 66  bpm. One PVC.   Recent Labs: 01/23/2022: Magnesium  2.0 02/25/2022: ALT 22; B Natriuretic Peptide 266.4 07/18/2022: BUN 11; Creatinine, Ser 0.96; Potassium 3.7; Sodium 138 08/21/2022: Hemoglobin 14.3; Platelets 255; TSH 0.612   Recent Lipid Panel    Component Value Date/Time   CHOL 155 01/23/2022 1440   TRIG 116 01/23/2022 1440   HDL 66 01/23/2022 1440   CHOLHDL 2.3 01/23/2022 1440   CHOLHDL 3.8 10/11/2016 0337   VLDL UNABLE TO CALCULATE IF TRIGLYCERIDE OVER 400 mg/dL 10/11/2016 0337   LDLCALC 69 01/23/2022 1440     Risk Assessment/Calculations:    CHA2DS2-VASc Score = 3  This indicates a 3.2% annual risk of stroke. The patient's score is based upon: CHF History: 0 HTN History: 1 Diabetes History: 0 Stroke History: 0 Vascular Disease History: 0 Age Score: 2 Gender Score: 0          Physical Exam:    VS:  BP 138/74   Pulse (!) 52   Ht '5\' 11"'$  (1.803 m)   Wt 186 lb (84.4 kg)   SpO2 97%   BMI 25.94 kg/m     Wt Readings from Last 3 Encounters:  08/29/22 186 lb (84.4 kg)  07/29/22 185 lb (83.9 kg)  07/21/22 185 lb (83.9 kg)     GEN: Well nourished, well developed in no acute distress HEENT: Normal NECK: No JVD; No carotid bruits CARDIAC: regular rhythm, bradycardic, no murmurs, rubs, gallops RESPIRATORY:  Clear to auscultation without rales, wheezing or rhonchi  ABDOMEN: Soft, non-tender, non-distended MUSCULOSKELETAL:  No edema; No deformity  SKIN: Warm and dry NEUROLOGIC:  Alert and oriented x 3 PSYCHIATRIC:  Normal affect   ASSESSMENT:    PAF Mild MR/TR Hypertension Catorid artery disease  PLAN:    In order of problems listed above:  #Paroxysmal Afib: S/p DCCV on 03/2022 now maintaining NSR. Briefly held apixaban for gross hematuria. Will continue apixaban for AC and metop for rate control. 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 -Continue serial monitoring  #HTN: -Continue  metoprolol '25mg'$  BID  #Mild Carotid Artery Disease: -Continue lipitor '20mg'$  daily -Declined carotids today; will re-discuss at 109monthfollow-up  #Left sided hydronephrosis: #Hematuria with anemia: S/p stent placement with urology. Doing much better. HgB normalized. Will re-challenge with apixaban and monitor response.  -Follow-up with Urology as scheduled     Follow-up:   6 months.  Medication Adjustments/Labs and Tests Ordered: Current medicines are reviewed at length with the patient today.  Concerns regarding medicines are outlined above.   Orders Placed This Encounter  Procedures   EKG 12-Lead   Meds ordered this encounter  Medications   apixaban (ELIQUIS) 5 MG TABS tablet    Sig: Take 1 tablet (5 mg total) by mouth 2 (two) times daily.    Dispense:  180 tablet    Refill:  3   Patient Instructions  Medication Instructions:   RECHALLENGE TAKING YOUR ELIQUIS   *If you need a refill on your cardiac medications before your next appointment, please call your pharmacy*    Follow-Up: [facility] and your health needs are our priority.  As part of our continuing mission to provide you with exceptional heart care, we have created designated Provider Care Teams.  These Care Teams include your primary Cardiologist (physician) and Advanced Practice Providers (APPs -  Physician Assistants and Nurse Practitioners) who all  work together to provide you with the care you need, when you need it.  We recommend signing up for the patient portal called "MyChart".  Sign up information is provided on this After Visit Summary.  MyChart is used to connect with patients for Virtual Visits (Telemedicine).  Patients are able to view lab/test results, encounter notes, upcoming appointments, etc.  Non-urgent messages can be sent to your provider as well.   To learn more about what you can do with MyChart, go to [email or web address].    Your next appointment:   6 month(s)  The  format for your next appointment:   In Person  Provider:   DR. Johney Frame   Important Information About Sugar         Signed, Freada Bergeron, MD  08/29/2022 11:46 AM    [place]

## 2022-08-29 NOTE — Patient Instructions (Signed)
Medication Instructions:   RECHALLENGE TAKING YOUR ELIQUIS   *If you need a refill on your cardiac medications before your next appointment, please call your pharmacy*    Follow-Up: At [facility], you and your health needs are our priority.  As part of our continuing mission to provide you with exceptional heart care, we have created designated Provider Care Teams.  These Care Teams include your primary Cardiologist (physician) and Advanced Practice Providers (APPs -  Physician Assistants and Nurse Practitioners) who all work together to provide you with the care you need, when you need it.  We recommend signing up for the patient portal called "MyChart".  Sign up information is provided on this After Visit Summary.  MyChart is used to connect with patients for Virtual Visits (Telemedicine).  Patients are able to view lab/test results, encounter notes, upcoming appointments, etc.  Non-urgent messages can be sent to your provider as well.   To learn more about what you can do with MyChart, go to [email or web address].    Your next appointment:   6 month(s)  The format for your next appointment:   In Person  Provider:   DR. Johney Frame   Important Information About Sugar

## 2022-09-02 ENCOUNTER — Ambulatory Visit: Payer: Self-pay

## 2022-09-02 DIAGNOSIS — Z85828 Personal history of other malignant neoplasm of skin: Secondary | ICD-10-CM | POA: Diagnosis not present

## 2022-09-02 DIAGNOSIS — C44311 Basal cell carcinoma of skin of nose: Secondary | ICD-10-CM | POA: Diagnosis not present

## 2022-09-02 NOTE — Patient Instructions (Signed)
Visit Information  Thank you for taking time to visit with me today. Please don't hesitate to contact me if I can be of assistance to you.   Following are the goals we discussed today:   Goals Addressed             This Visit's Progress    RNCM: Health and wellness       Care Coordination Interventions: Evaluation of current treatment plan related to procedure the patient had today with dermatologist to remove an area off of his nose and chronic conditions and patient's adherence to plan as established by provider Advised patient to call the [facility] for questions, concerns, new needs. The patient states his wife is a retired Marine scientist and she takes very good care of him. He denies any needs or concerns. Education provided on the goals of the program and to call for any new needs.  Reviewed scheduled/upcoming provider appointments including 09-26-2022 at 220 pm Discussed plans with patient for ongoing care management follow up and provided patient with direct contact information for care management team Advised patient to discuss changes in chronic conditions, questions or concerns with provider Assessed social determinant of health barriers            Please call the care guide team at [phone] if you need to schedule an appointment.   If you are experiencing a Mental Health or [place] or need someone to talk to, please call the Suicide and Crisis Lifeline: 988 call the [place] National Suicide Prevention Lifeline: 304-014-6260 or TTY: 218 108 1981 TTY (660) 371-1197) to talk to a trained counselor call 1-800-273-TALK (toll free, 24 hour hotline)  Patient verbalizes understanding of instructions and care plan provided today and agrees to view in [place]. Active MyChart status and patient understanding of how to access instructions and care plan via MyChart confirmed with patient.     No further follow up required: the patient denies any needs at this time. Knows to call  the [facility] for new questions or concerns

## 2022-09-02 NOTE — Patient Outreach (Signed)
  Care Coordination   Initial Visit Note   09/02/2022 Name: Eugene White MRN: [id number] DOB: 05-Jul-1946  Eugene White is a 76 y.o. year old male who sees Thedore Mins, Ria Comment, [place] for primary care. I spoke with  Eugene White by phone today.  What matters to the patients health and wellness today?  Patient had a procedure to have a place removed off of his nose today. He denies any new concerns or issues. Knows to call the RNCM if needed    Goals Addressed             This Visit's Progress    RNCM: Health and wellness       Care Coordination Interventions: Evaluation of current treatment plan related to procedure the patient had today with dermatologist to remove an area off of his nose and chronic conditions and patient's adherence to plan as established by provider Advised patient to call the [facility] for questions, concerns, new needs. The patient states his wife is a retired Marine scientist and she takes very good care of him. He denies any needs or concerns. Education provided on the goals of the program and to call for any new needs.  Reviewed scheduled/upcoming provider appointments including 09-26-2022 at 220 pm Discussed plans with patient for ongoing care management follow up and provided patient with direct contact information for care management team Advised patient to discuss changes in chronic conditions, questions or concerns with provider Assessed social determinant of health barriers           SDOH assessments and interventions completed:  Yes  SDOH Interventions Today    Flowsheet Row Most Recent Value  SDOH Interventions   Food Insecurity Interventions Intervention Not Indicated  Housing Interventions Intervention Not Indicated  Transportation Interventions Intervention Not Indicated  Utilities Interventions Intervention Not Indicated        Care Coordination Interventions Activated:  Yes  Care Coordination Interventions:  Yes, provided   Follow up plan: No  further intervention required.   Encounter Outcome:  Pt. Visit Completed   Noreene Larsson RN, MSN, [facility] Mobile: [phone]

## 2022-09-04 ENCOUNTER — Telehealth: Payer: Self-pay | Admitting: Urology

## 2022-09-04 NOTE — Telephone Encounter (Signed)
Patient's wife called the office to find out the results of the patient's [place] renal lasix study and plan for stent removal.  Please advise.

## 2022-09-11 NOTE — Telephone Encounter (Signed)
Case and imaging reviewed with my partners for second opinion.    Suspect Lasix renal study likely overestimates the function in the left kidney based on accumulation of radiotracer in the dilation in the absence of excretion.  In discussion with the patient further today, he took a single dose of Eliquis and began to bleed again.  He is anxious about resuming his Eliquis and has not been tolerating the stent particularly well.  In light of these 2 things, after discussion today, we elected for conservative management with stent removal call follow-up in 3 months thereafter with renal ultrasound/BMP.  [name], MD

## 2022-09-11 NOTE — Telephone Encounter (Signed)
Schedule updated.

## 2022-09-17 ENCOUNTER — Encounter: Payer: Self-pay | Admitting: Urology

## 2022-09-17 ENCOUNTER — Ambulatory Visit (INDEPENDENT_AMBULATORY_CARE_PROVIDER_SITE_OTHER): Payer: Medicare HMO | Admitting: Urology

## 2022-09-17 VITALS — BP 159/91 | HR 68 | Ht 71.0 in | Wt 183.0 lb

## 2022-09-17 DIAGNOSIS — R319 Hematuria, unspecified: Secondary | ICD-10-CM | POA: Diagnosis not present

## 2022-09-17 DIAGNOSIS — N1330 Unspecified hydronephrosis: Secondary | ICD-10-CM | POA: Diagnosis not present

## 2022-09-17 LAB — MICROSCOPIC EXAMINATION
Bacteria, UA: NONE SEEN
RBC, Urine: >30 /hpf — AB (ref 0–2)

## 2022-09-17 LAB — URINALYSIS, COMPLETE
Bilirubin, UA: NEGATIVE
Glucose, UA: NEGATIVE
Ketones, UA: NEGATIVE
Nitrite, UA: NEGATIVE
Specific Gravity, UA: 1.015 (ref 1.005–1.030)
Urobilinogen, Ur: 0.2 mg/dL (ref 0.2–1.0)
pH, UA: 6 (ref 5.0–7.5)

## 2022-09-17 NOTE — Progress Notes (Signed)
   09/17/22  CC:  Chief Complaint  Patient presents with   Procedure    HPI: 76 yo M with history of left hydronephrosis who presents today for stent removal.  See previous notes for details.   Blood pressure (!) 159/91, pulse 68, height '5\' 11"'$  (1.803 m), weight 183 lb (83 kg). NED. A&Ox3.   No respiratory distress   Abd soft, NT, ND Normal phallus with bilateral descended testicles    Cystoscopy/ Stent removal procedure  Patient identification was confirmed, informed consent was obtained, and patient was prepped using Betadine solution.  Lidocaine jelly was administered per urethral meatus.    Preoperative abx where received prior to procedure.    Procedure: - Flexible cystoscope introduced, without any difficulty.   - Thorough search of the bladder revealed:    normal urethral meatus  Stent seen emanating from left ureteral orifice, grasped with stent graspers, and removed in entirety.    Post-Procedure: - Patient tolerated the procedure well  Assessment/ Plan:  1. Left hydro .  Decision making to remove stent  Complication  Warning symptoms reviewed  Follow-up in 6 weeks with renal ultrasound - Urinalysis, Complete - Ultrasound renal complete; Future   F/u  3 momths RUS  [name], MD

## 2022-09-26 ENCOUNTER — Ambulatory Visit: Payer: Medicare HMO | Admitting: Physician Assistant

## 2022-10-01 ENCOUNTER — Encounter: Payer: Self-pay | Admitting: Physician Assistant

## 2022-10-01 ENCOUNTER — Ambulatory Visit (INDEPENDENT_AMBULATORY_CARE_PROVIDER_SITE_OTHER): Payer: Medicare HMO | Admitting: Physician Assistant

## 2022-10-01 VITALS — BP 130/70 | HR 60 | Wt 179.6 lb

## 2022-10-01 DIAGNOSIS — N1330 Unspecified hydronephrosis: Secondary | ICD-10-CM

## 2022-10-01 DIAGNOSIS — K219 Gastro-esophageal reflux disease without esophagitis: Secondary | ICD-10-CM | POA: Diagnosis not present

## 2022-10-01 DIAGNOSIS — E7800 Pure hypercholesterolemia, unspecified: Secondary | ICD-10-CM | POA: Diagnosis not present

## 2022-10-01 DIAGNOSIS — I10 Essential (primary) hypertension: Secondary | ICD-10-CM | POA: Diagnosis not present

## 2022-10-01 DIAGNOSIS — F325 Major depressive disorder, single episode, in full remission: Secondary | ICD-10-CM

## 2022-10-01 DIAGNOSIS — I4891 Unspecified atrial fibrillation: Secondary | ICD-10-CM

## 2022-10-01 DIAGNOSIS — Z23 Encounter for immunization: Secondary | ICD-10-CM

## 2022-10-01 MED ORDER — VENLAFAXINE HCL ER 75 MG PO CP24: ORAL_CAPSULE | ORAL | 1 refills | Status: DC

## 2022-10-01 MED ORDER — PANTOPRAZOLE SODIUM 40 MG PO TBEC: 40.0000 mg | DELAYED_RELEASE_TABLET | Freq: Every day | ORAL | 1 refills | Status: DC

## 2022-10-01 NOTE — Assessment & Plan Note (Signed)
Stable, continue current medications.  

## 2022-10-01 NOTE — Assessment & Plan Note (Addendum)
Normal rate and rhythm today Restarted anticoagulation therapy F/b cardiology

## 2022-10-01 NOTE — Assessment & Plan Note (Signed)
Last checked 1/23 by cardiology LDL at goal  Continue current medications

## 2022-10-01 NOTE — Assessment & Plan Note (Signed)
Resolved follows with urology most recently stent removed

## 2022-10-01 NOTE — Progress Notes (Signed)
I,Eugene White,acting as a Education administrator for [employer], PA-C.,have documented all relevant documentation on the behalf of Eugene Kirschner, PA-C,as directed by  Eugene Kirschner, PA-C while in the presence of Eugene Kirschner, PA-C.   Annual Wellness Visit     Patient: Eugene White, Male    DOB: 25-Jul-1946, 76 y.o.   MRN: [id number] Visit Date: 10/01/2022  Today's Provider: [name], PA-C   Cc. awv  Subjective    Eugene White is a 76 y.o. male who presents today for his Annual Wellness Visit. He reports consuming a general diet. Home exercise routine includes cycling for 8 minutes two to three times a day. He generally feels well. He reports sleeping fairly well. He does not have additional problems to discuss today.   Medications: Outpatient Medications Prior to Visit  Medication Sig   amoxicillin (AMOXIL) 500 MG capsule Take 4 capsules by mouth as directed. Before dental appointment   apixaban (ELIQUIS) 5 MG TABS tablet Take 1 tablet (5 mg total) by mouth 2 (two) times daily.   atorvastatin (LIPITOR) 20 MG tablet Take 1 tablet (20 mg total) by mouth daily.   Cholecalciferol (VITAMIN D) 2000 UNITS CAPS Take 2,000 Units by mouth every evening.    diphenhydramine-acetaminophen (TYLENOL PM) 25-500 MG TABS tablet Take 4 tablets by mouth at bedtime as needed (sleep).   ibuprofen (ADVIL) 200 MG tablet Take 800 mg by mouth daily as needed for moderate pain.   levothyroxine (SYNTHROID) 100 MCG tablet Take 1 tablet (100 mcg total) by mouth daily.   magnesium oxide (MAG-OX) 400 (240 Mg) MG tablet TAKE 1 TABLET TWICE DAILY .   Menthol, Topical Analgesic, (BLUE-EMU MAXIMUM STRENGTH EX) Apply 1 application  topically at bedtime as needed (pain).   metoprolol tartrate (LOPRESSOR) 25 MG tablet Take 1 tablet (25 mg total) by mouth 2 (two) times daily.   Multiple Vitamin (MULTIVITAMIN WITH MINERALS) TABS tablet Take 1 tablet by mouth daily.    Multiple Vitamins-Minerals (PRESERVISION  AREDS 2) CAPS Take 1 capsule by mouth 2 (two) times daily.    potassium chloride SA (KLOR-CON M20) 20 MEQ tablet Take 2 tablets (40 mEq total) by mouth daily.   [DISCONTINUED] pantoprazole (PROTONIX) 40 MG tablet Take 1 tablet (40 mg total) by mouth daily.   [DISCONTINUED] venlafaxine XR (EFFEXOR-XR) 75 MG 24 hr capsule TAKE 1 CAPSULE EVERY DAY (NEED MD APPOINTMENT FOR REFILLS)   No facility-administered medications prior to visit.    Allergies  Allergen Reactions   Sulfa Antibiotics Other (See Comments)    Unknown/childhood allergy unknown    Patient Care Team: Eugene Kirschner, PA-C as PCP - General (Physician Assistant) Estill Cotta, MD as Consulting Physician (Ophthalmology) [name], MD as Consulting Physician (Orthopedic Surgery) Freada Bergeron, MD as Consulting Physician (Cardiology)  Review of Systems  Constitutional:  Negative for fatigue and fever.  Respiratory:  Negative for cough and shortness of breath.   Cardiovascular:  Negative for chest pain, palpitations and leg swelling.  Neurological:  Negative for dizziness and headaches.       Objective    Vitals: BP 130/70 Comment: home value  Pulse 60   Wt 179 lb 9.6 oz (81.5 kg)   SpO2 100%   BMI 25.05 kg/m    Physical Exam Constitutional:      General: He is awake.     Appearance: He is well-developed.  HENT:     Head: Normocephalic.  Eyes:     Conjunctiva/sclera: Conjunctivae normal.  Comments: Chronic Blepharoptosis right eye, drooping and immobility to right side of face/mouth  Cardiovascular:     Rate and Rhythm: Normal rate and regular rhythm.     Heart sounds: Normal heart sounds.  Pulmonary:     Effort: Pulmonary effort is normal.     Breath sounds: Normal breath sounds.  Abdominal:     Palpations: Abdomen is soft.     Tenderness: There is no abdominal tenderness.  Musculoskeletal:     Right lower leg: No edema.     Left lower leg: No edema.  Skin:    General: Skin is  warm.  Neurological:     Mental Status: He is alert and oriented to person, place, and time.  Psychiatric:        Attention and Perception: Attention normal.        Mood and Affect: Mood normal.        Speech: Speech normal.        Behavior: Behavior is cooperative.    Most recent functional status assessment:    10/01/2022    3:58 PM  In your present state of health, do you have any difficulty performing the following activities:  Hearing? 0  Vision? 0  Difficulty concentrating or making decisions? 0  Walking or climbing stairs? 1  Dressing or bathing? 0  Doing errands, shopping? 0   Most recent fall risk assessment:    10/01/2022    3:57 PM  [place] in the past year? 1  Number falls in past yr: 1  Injury with Fall? 1  Risk for fall due to : History of fall(s)  Follow up Falls evaluation completed    Most recent depression screenings:    10/01/2022    3:58 PM 06/26/2022    2:26 PM  PHQ 2/9 Scores  PHQ - 2 Score 0 0  PHQ- 9 Score 3 5   Most recent cognitive screening:     No data to display         Most recent Audit-C alcohol use screening    10/01/2022    3:57 PM  Alcohol Use Disorder Test (AUDIT)  1. How often do you have a drink containing alcohol? 4  2. How many drinks containing alcohol do you have on a typical day when you are drinking? 1  3. How often do you have six or more drinks on one occasion? 1  AUDIT-C Score 6   A score of 3 or more in women, and 4 or more in men indicates increased risk for alcohol abuse, EXCEPT if all of the points are from question 1   No results found for any visits on 10/01/22.  Assessment & Plan     Annual wellness visit done today including the all of the following: Reviewed patient's Family Medical History Reviewed and updated list of patient's medical providers Assessed patient's functional ability [place] Completed and Reviewed  Exercise Activities and Dietary recommendations --balanced  diet high in fiber and protein, low in sugars, carbs, fats. --physical activity/exercise 30 minutes 3-5 times a week   Avoid Falls -stay active! -Wear closed toe shoes, remove hazards around home -- clutter on floor, loose carpets, low tables, brightly lit rooms -Assistive changes to make to home: Handrails for both sides of stairways Nonslip treads for bare-wood steps A raised toilet seat or one with armrests Grab bars for the shower or tub A sturdy plastic seat for the shower or tub -- plus a  hand-held shower nozzle for bathing while sitting down   Immunization History  Administered Date(s) Administered   Fluad Quad(high Dose 65+) 10/11/2019, 10/01/2022   Influenza, High Dose Seasonal PF 11/01/2015, 09/15/2017   Influenza-Unspecified 09/28/2013, 10/07/2018, 11/05/2021   PFIZER(Purple Top)SARS-COV-2 Vaccination 02/04/2020, 02/28/2020, 10/18/2020   Pneumococcal Conjugate-13 09/15/2017   Pneumococcal Polysaccharide-23 01/07/2013   Pneumococcal-Unspecified 04/28/2018   Zoster, Live 01/07/2013    Health Maintenance  Topic Date Due   Zoster Vaccines- Shingrix (1 of 2) Never done   COVID-19 Vaccine (4 - Pfizer risk series) 12/13/2020   Hepatitis C Screening  10/02/2023 (Originally 08/26/1964)   TETANUS/TDAP  01/11/2023   Pneumonia Vaccine 51+ Years old  Completed   INFLUENZA VACCINE  Completed   HPV VACCINES  Aged Out   COLONOSCOPY (Pts 45-32yr Insurance coverage will need to be confirmed)  Discontinued     Discussed health benefits of physical activity, and encouraged him to engage in regular exercise appropriate for his age and condition.    Problem List Items Addressed This Visit       Cardiovascular and Mediastinum   Essential (primary) hypertension - Primary    Elevated in office today. Was in range at cardiologist appt and is typically in range at home Advised pt to check more frequently at home and f/u if elevated F/u 6 mo if <140/90 at home.        Atrial  fibrillation with rapid ventricular response (HCC)    Normal rate and rhythm today Restarted anticoagulation therapy F/b cardiology        Digestive   GERD (gastroesophageal reflux disease)    Stable, continue current medications      Relevant Medications   pantoprazole (PROTONIX) 40 MG tablet     Genitourinary   Hydroureteronephrosis of left kidney    Resolved follows with urology most recently stent removed        Other   Major depressive disorder with single episode    Chronic and stable continue current medications      Relevant Medications   venlafaxine XR (EFFEXOR-XR) 75 MG 24 hr capsule   Pure hypercholesterolemia    Last checked 1/23 by cardiology LDL at goal  Continue current medications       Other Visit Diagnoses     Need for immunization against influenza       Relevant Orders   Flu Vaccine QUAD High Dose(Fluad) (Completed)        Return in about 6 months (around 04/02/2023) for hypertension, depression.     I, LMikey Kirschner PA-C have reviewed all documentation for this visit. The documentation on  10/01/2022 for the exam, diagnosis, procedures, and orders are all accurate and complete.  LMikey Kirschner PA-C [facility] [place] [id number]: [phone]: [phone]

## 2022-10-01 NOTE — Assessment & Plan Note (Signed)
Chronic and stable continue current medications

## 2022-10-01 NOTE — Assessment & Plan Note (Signed)
Elevated in office today. Was in range at cardiologist appt and is typically in range at home Advised pt to check more frequently at home and f/u if elevated F/u 6 mo if <140/90 at home.

## 2022-10-15 IMAGING — CR DG CHEST 2V
2 series · 2 of 2 positions shown · non-contrast
Comparison: Chest x-ray dated December 13, 2018

CLINICAL DATA: Shortness of breath

EXAM:
CHEST - 2 VIEW

[chest pa]
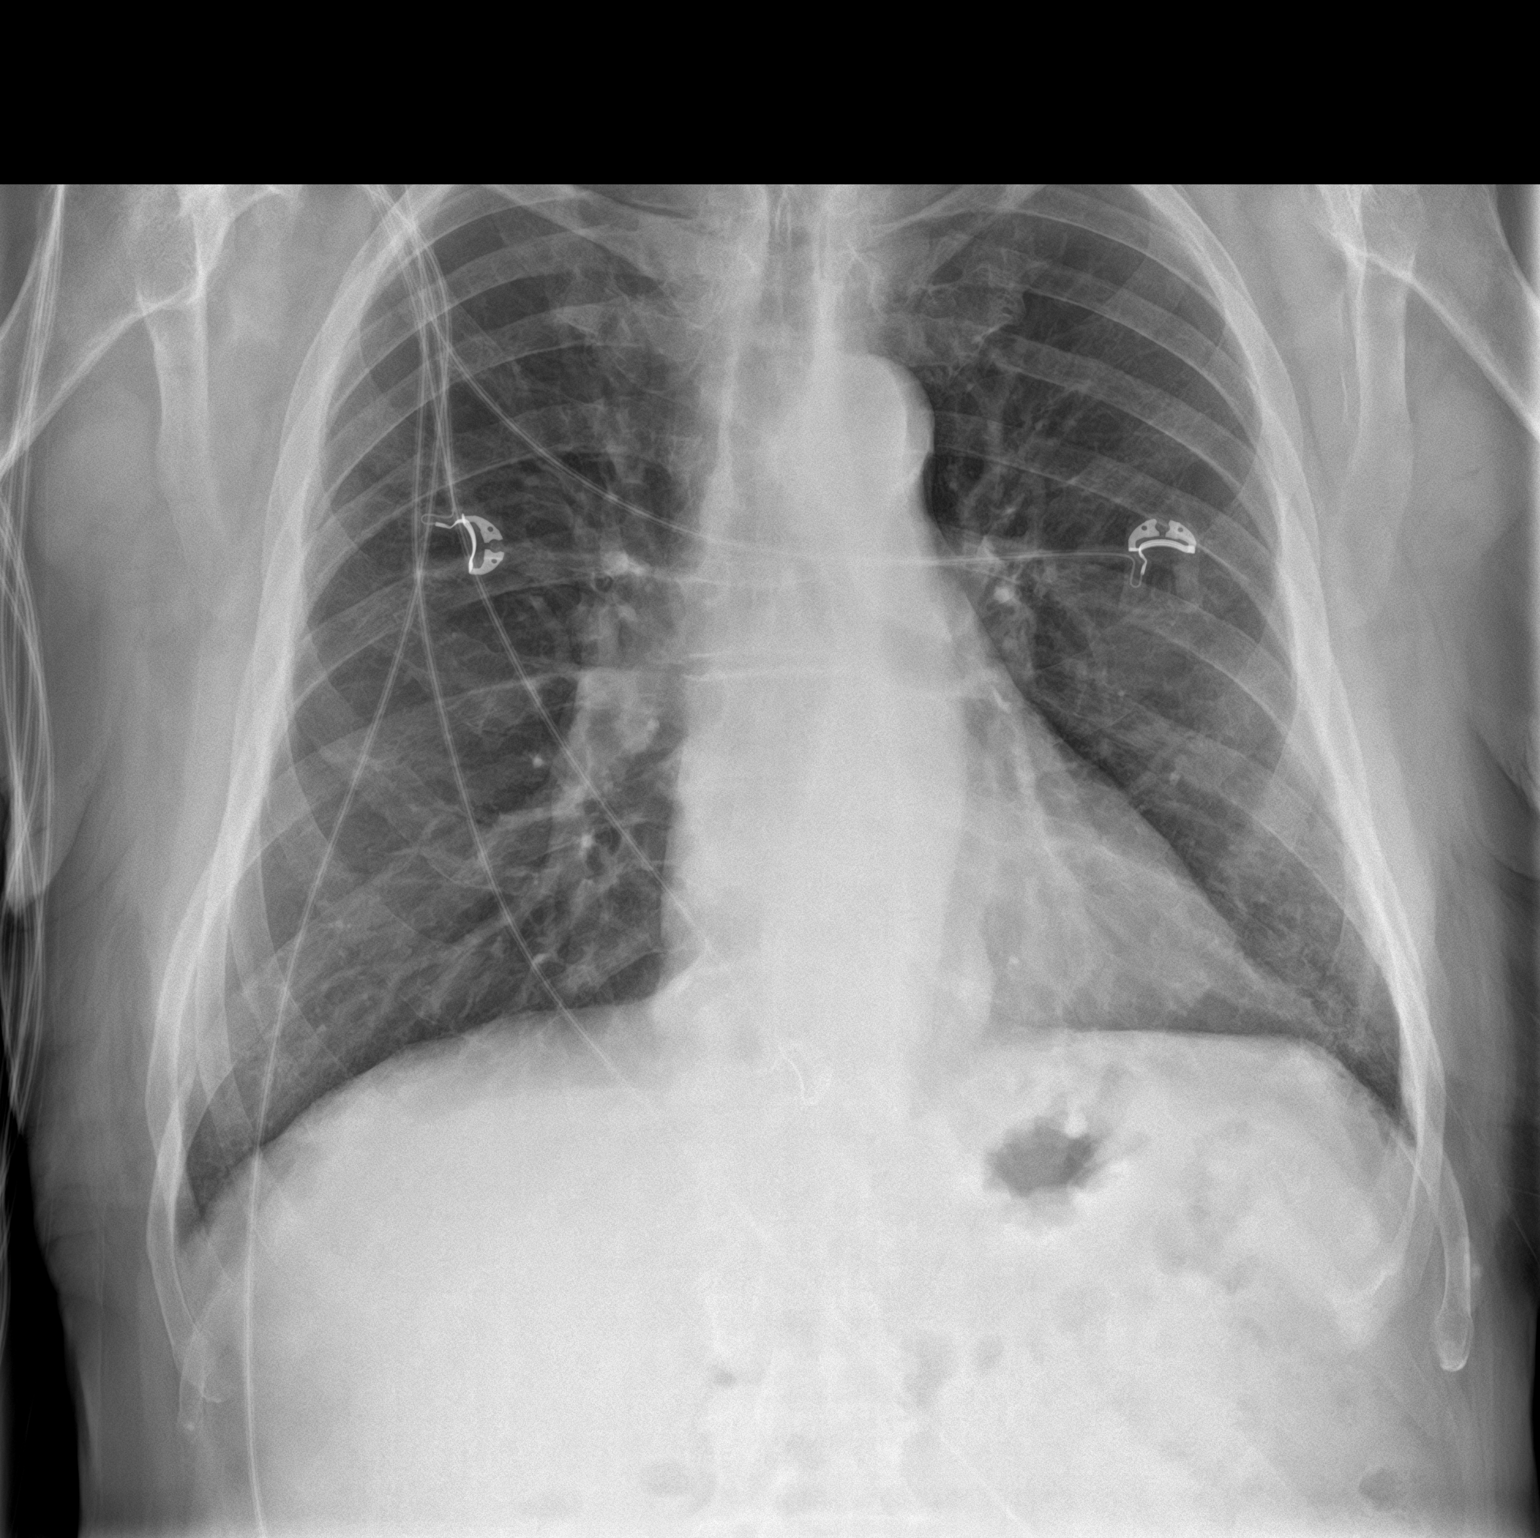

[chest lat]
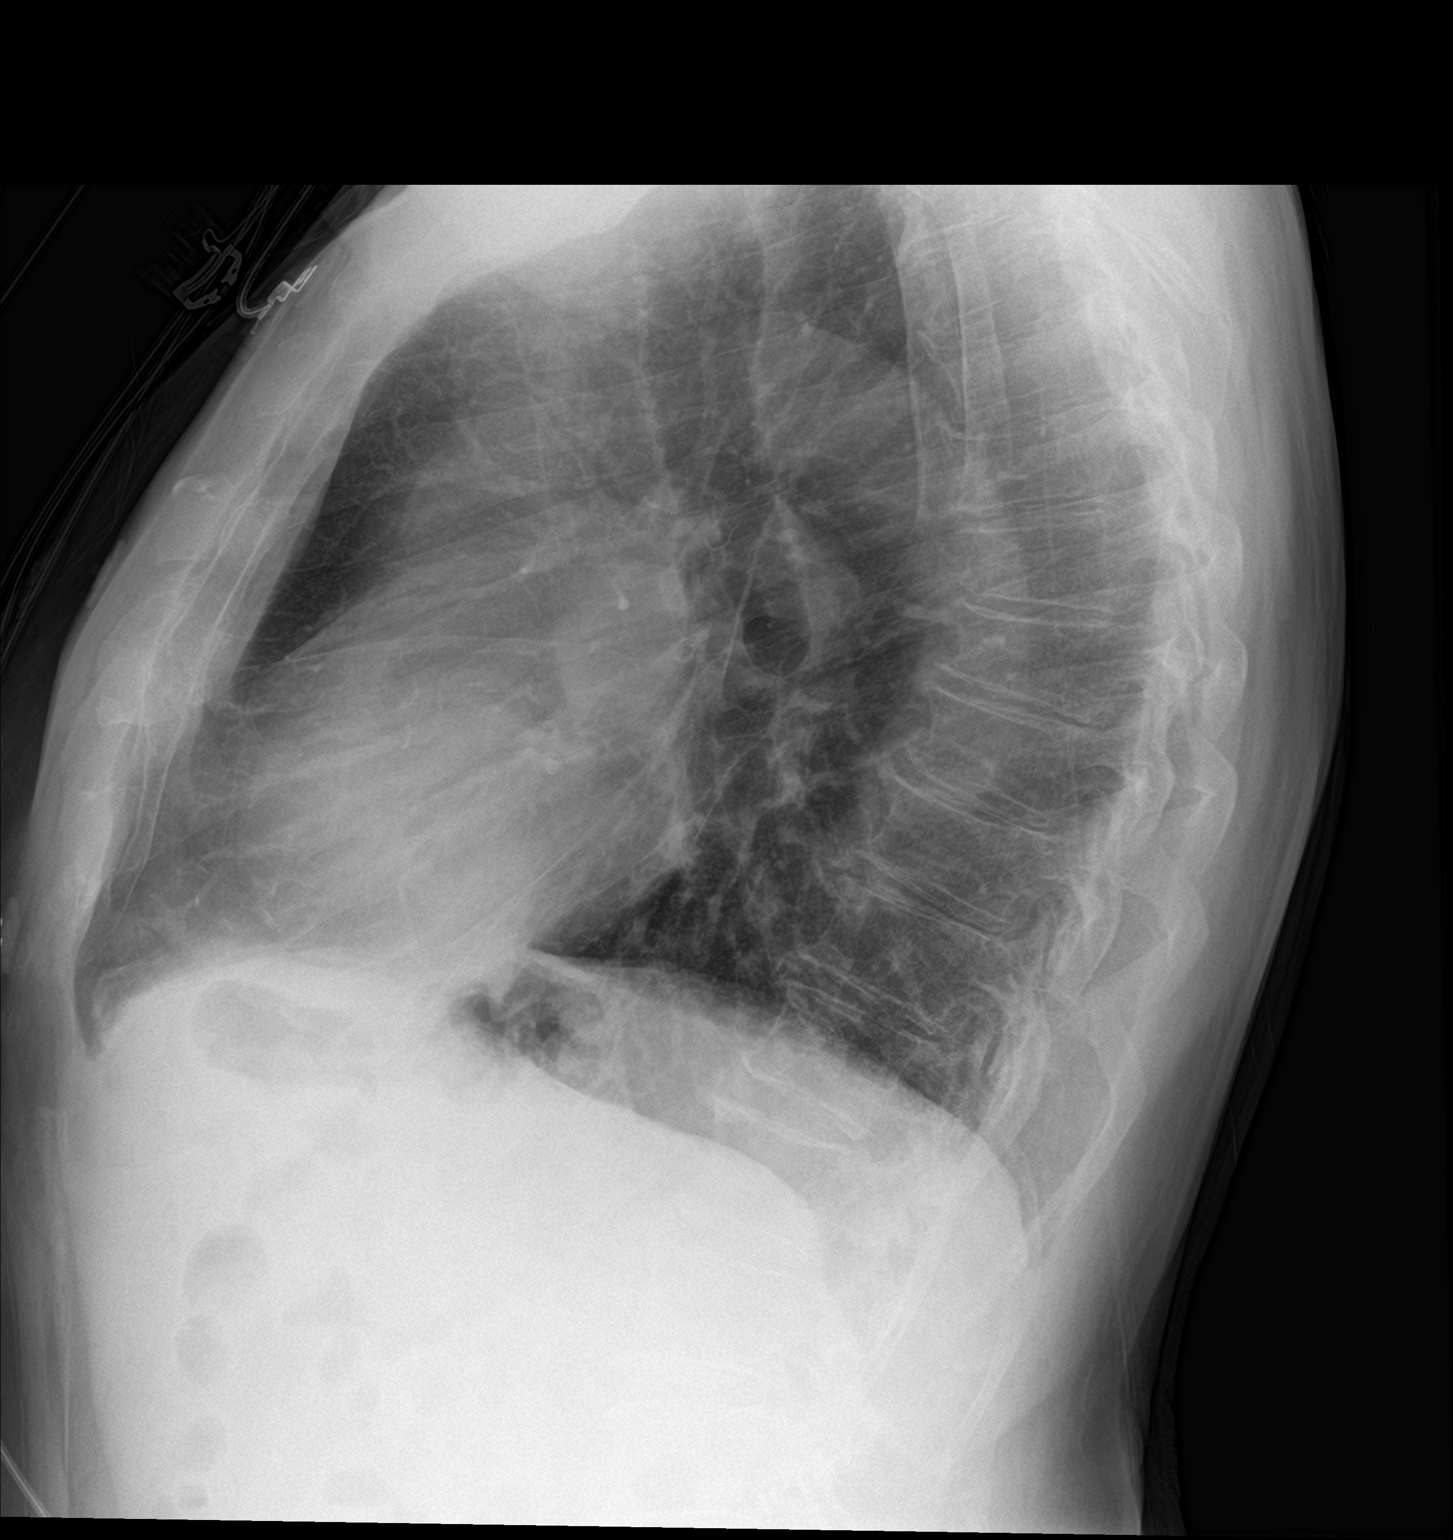

[2 of 2 positions shown; findings below may reference images not displayed]

FINDINGS: Cardiac and mediastinal contours are within normal limits. Mild
bibasilar atelectasis. Lungs otherwise clear. No pleural effusion or
pneumothorax.
IMPRESSION: No active cardiopulmonary disease.

## 2022-10-28 ENCOUNTER — Other Ambulatory Visit: Payer: Self-pay | Admitting: *Deleted

## 2022-10-28 DIAGNOSIS — Z79899 Other long term (current) drug therapy: Secondary | ICD-10-CM

## 2022-10-28 DIAGNOSIS — I340 Nonrheumatic mitral (valve) insufficiency: Secondary | ICD-10-CM

## 2022-10-28 DIAGNOSIS — I071 Rheumatic tricuspid insufficiency: Secondary | ICD-10-CM

## 2022-10-28 MED ORDER — POTASSIUM CHLORIDE CRYS ER 20 MEQ PO TBCR: 40.0000 meq | EXTENDED_RELEASE_TABLET | Freq: Every day | ORAL | 3 refills | Status: DC

## 2022-11-10 DIAGNOSIS — S5012XA Contusion of left forearm, initial encounter: Secondary | ICD-10-CM | POA: Diagnosis not present

## 2022-11-10 DIAGNOSIS — M25512 Pain in left shoulder: Secondary | ICD-10-CM | POA: Diagnosis not present

## 2022-11-26 ENCOUNTER — Other Ambulatory Visit: Payer: Self-pay

## 2022-11-26 DIAGNOSIS — I480 Paroxysmal atrial fibrillation: Secondary | ICD-10-CM

## 2022-11-26 MED ORDER — METOPROLOL TARTRATE 25 MG PO TABS: 25.0000 mg | ORAL_TABLET | Freq: Two times a day (BID) | ORAL | 2 refills | Status: DC

## 2022-12-16 ENCOUNTER — Other Ambulatory Visit: Payer: Self-pay | Admitting: Physician Assistant

## 2022-12-16 DIAGNOSIS — K219 Gastro-esophageal reflux disease without esophagitis: Secondary | ICD-10-CM

## 2023-01-05 ENCOUNTER — Other Ambulatory Visit: Payer: Self-pay | Admitting: Physician Assistant

## 2023-01-05 ENCOUNTER — Other Ambulatory Visit: Payer: Self-pay

## 2023-01-05 DIAGNOSIS — Z79899 Other long term (current) drug therapy: Secondary | ICD-10-CM

## 2023-01-05 DIAGNOSIS — I071 Rheumatic tricuspid insufficiency: Secondary | ICD-10-CM

## 2023-01-05 DIAGNOSIS — I340 Nonrheumatic mitral (valve) insufficiency: Secondary | ICD-10-CM

## 2023-01-05 MED ORDER — MAGNESIUM OXIDE -MG SUPPLEMENT 400 (240 MG) MG PO TABS: ORAL_TABLET | ORAL | 3 refills | Status: DC

## 2023-01-06 ENCOUNTER — Ambulatory Visit: Payer: Medicare HMO | Admitting: Urology

## 2023-01-07 ENCOUNTER — Other Ambulatory Visit: Payer: Self-pay

## 2023-01-07 DIAGNOSIS — I340 Nonrheumatic mitral (valve) insufficiency: Secondary | ICD-10-CM

## 2023-01-07 DIAGNOSIS — Z79899 Other long term (current) drug therapy: Secondary | ICD-10-CM

## 2023-01-07 DIAGNOSIS — I071 Rheumatic tricuspid insufficiency: Secondary | ICD-10-CM

## 2023-01-07 MED ORDER — MAGNESIUM OXIDE -MG SUPPLEMENT 400 (240 MG) MG PO TABS: ORAL_TABLET | ORAL | 2 refills | Status: DC

## 2023-01-09 ENCOUNTER — Ambulatory Visit
Admission: RE | Admit: 2023-01-09 | Discharge: 2023-01-09 | Disposition: A | Discharge status: Home / Self Care | Payer: Medicare HMO | Source: Ambulatory Visit | Attending: Urology | Admitting: Urology

## 2023-01-09 DIAGNOSIS — R109 Unspecified abdominal pain: Secondary | ICD-10-CM | POA: Diagnosis not present

## 2023-01-09 DIAGNOSIS — R319 Hematuria, unspecified: Secondary | ICD-10-CM | POA: Diagnosis not present

## 2023-01-27 ENCOUNTER — Ambulatory Visit: Payer: Medicare HMO | Admitting: Urology

## 2023-01-28 ENCOUNTER — Ambulatory Visit: Payer: Medicare HMO | Admitting: Urology

## 2023-01-28 VITALS — BP 189/94 | HR 61 | Ht 71.0 in | Wt 180.0 lb

## 2023-01-28 DIAGNOSIS — R319 Hematuria, unspecified: Secondary | ICD-10-CM

## 2023-01-28 DIAGNOSIS — N1330 Unspecified hydronephrosis: Secondary | ICD-10-CM

## 2023-01-28 NOTE — Progress Notes (Signed)
Eugene White,acting as a scribe for Eugene Espy, MD.,have documented all relevant documentation on the behalf of Eugene Espy, MD,as directed by  Eugene Espy, MD while in the presence of Eugene Espy, MD.  01/28/2023 4:05 PM   Eugene White 06-17-1946 [id number]  Referring provider: [name], PA-C [address] [place],  [place] [address]  Chief Complaint  Patient presents with   Hydronephrosis    HPI: 77 year-old male with a personal history of severe left hydronephrosis and proximal hydroureter.   Noted to have blood filled collection system at the time of procedure. He returned to the operating room for a diagnostic ureteroscopy in July of 2023 and noted to have persistently dilated left renal pelvis with caliectasis but no obvious mucosal or papillary lesions. He had a negative CT for underlying this. His stent was subsequently removed.   He returns today with a renal ultrasound. He does not have any significant residual left hydronephrosis. He does have some cortical thinning on the left side. Notably, his left kidney does have decreased renal function at 26% compared to the right.   He denies any urinary retention or flank pain.    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   First degree AV block    GERD (gastroesophageal reflux disease)    GI symptom    had nausea / vomited once / frequent stools / getting better   Hyperlipidemia    Hypertension    Hypothyroidism    Mild carotid artery disease (HCC)    Mitral regurgitation    Mood changes    PAF (paroxysmal atrial fibrillation)  (HCC)    Pneumonia    hx of   Tricuspid regurgitation    Tricuspid regurgitation     Surgical History: Past Surgical History:  Procedure Laterality Date   CARDIOVERSION N/A 01/12/2018   Procedure: CARDIOVERSION;  Surgeon: Dorothy Spark, MD;  Location: [place] [facility];  Service: Cardiovascular;  Laterality: N/A;   CARDIOVERSION N/A 01/26/2019   Procedure: CARDIOVERSION;  Surgeon: Jerline Pain, MD;  Location: [facility] ENDOSCOPY;  Service: Cardiovascular;  Laterality: N/A;   CARDIOVERSION N/A 09/30/2019   Procedure: CARDIOVERSION;  Surgeon: Lelon Perla, MD;  Location: [facility] ENDOSCOPY;  Service: Cardiovascular;  Laterality: N/A;   CARDIOVERSION N/A 04/04/2022   Procedure: CARDIOVERSION;  Surgeon: Janina Mayo, MD;  Location: [facility] ENDOSCOPY;  Service: Cardiovascular;  Laterality: N/A;   COLONOSCOPY  2014   CYSTOSCOPY WITH STENT PLACEMENT Left 06/18/2022   Procedure: CYSTOSCOPY WITH STENT PLACEMENT;  Surgeon: Eugene Espy, MD;  Location: [facility] ORS;  Service: Urology;  Laterality: Left;   CYSTOSCOPY WITH STENT PLACEMENT Left 07/21/2022   Procedure: CYSTOSCOPY WITH STENT EXCHANGE;  Surgeon: Eugene Espy, MD;  Location: [facility]S;  Service: Urology;  Laterality: Left;   FEMUR IM NAIL Left 10/11/2016   Procedure: INTRAMEDULLARY (IM) NAIL FEMORAL;  Surgeon: [name], MD;  Location: [facility];  Service: Orthopedics;  Laterality: Left;   HAND SURGERY   1973 / 2010 / 2015   right to release tendons   HARDWARE REMOVAL Left 10/05/2017  Procedure: Removal of left femoral nail;  Surgeon: [name], MD;  Location: [facility];  Service: Orthopedics;  Laterality: Left;  90 mins   HEMATOMA EVACUATION Left 01/19/2018   Procedure: Irrigation and debridement, Evacuation of left total knee hematoma;  Surgeon: [name], MD;  Location: [facility];  Service: Orthopedics;  Laterality: Left;   KNEE ARTHROSCOPY  1984 & 2001   twice   NASAL SEPTUM SURGERY     removal Left femoral nail      10/05/17 Dr. [name]    SHOULDER OPEN ROTATOR CUFF REPAIR  1999 & 2006   Right and left   TEE WITH CARDIOVERSION  01/12/2018   TEE WITHOUT CARDIOVERSION N/A 01/12/2018   Procedure: TRANSESOPHAGEAL ECHOCARDIOGRAM (TEE);  Surgeon: Dorothy Spark, MD;  Location: [facility] ENDOSCOPY;  Service: Cardiovascular;  Laterality: N/A;   TONSILLECTOMY     TOTAL KNEE ARTHROPLASTY Right 01/09/2015   Procedure: RIGHT TOTAL KNEE ARTHROPLASTY;  Surgeon: [name], MD;  Location: [facility];  Service: Orthopedics;  Laterality: Right;   TOTAL KNEE ARTHROPLASTY Left 11/23/2017   Procedure: LEFT TOTAL KNEE ARTHROPLASTY;  Surgeon: [name], MD;  Location: [facility];  Service: Orthopedics;  Laterality: Left;  90 mins   URETERAL BIOPSY Left 07/21/2022   Procedure: URETERAL BIOPSY;  Surgeon: Eugene Espy, MD;  Location: [facility];  Service: Urology;  Laterality: Left;   URETEROSCOPY Left 07/21/2022   Procedure: URETEROSCOPY;  Surgeon: Eugene Espy, MD;  Location: [facility];  Service: Urology;  Laterality: Left;    Home Medications:  Allergies as of 01/28/2023       Reactions   Sulfa Antibiotics Other (See Comments)   Unknown/childhood allergy unknown        Medication List        Accurate as of January 28, 2023  4:05 PM. If you have any questions, ask your nurse or doctor.          amoxicillin 500 MG capsule Commonly known as: AMOXIL Take 4 capsules by mouth as directed. Before dental appointment   apixaban 5 MG Tabs tablet Commonly known as: Eliquis Take 1 tablet (5 mg total) by mouth 2 (two) times daily.   atorvastatin 20 MG tablet Commonly known as: LIPITOR TAKE 1 TABLET (20 MG TOTAL) BY MOUTH DAILY.   BLUE-EMU MAXIMUM STRENGTH EX Apply 1 application  topically at bedtime as needed (pain).   diphenhydramine-acetaminophen 25-500 MG Tabs tablet Commonly known as: TYLENOL PM Take 4 tablets by mouth at bedtime as needed (sleep).   ibuprofen 200 MG tablet Commonly known as: ADVIL Take 800 mg by mouth daily as  needed for moderate pain.   levothyroxine 100 MCG tablet Commonly known as: SYNTHROID Take 1 tablet (100 mcg total) by mouth daily.   magnesium oxide 400 (240 Mg) MG tablet Commonly known as: MAG-OX TAKE 1 TABLET TWICE DAILY .   metoprolol tartrate 25 MG tablet Commonly known as: LOPRESSOR Take 1 tablet (25 mg total) by mouth 2 (two) times daily.   multivitamin with minerals Tabs tablet Take 1 tablet by mouth daily.   pantoprazole 40 MG tablet Commonly known as: PROTONIX TAKE 1 TABLET BY MOUTH EVERY DAY   potassium chloride SA 20 MEQ tablet Commonly known as: [place]-Con M20 Take 2 tablets (40 mEq total) by mouth daily.   PreserVision AREDS 2 Caps Take 1 capsule by mouth 2 (two) times daily.   venlafaxine XR 75 MG 24 hr capsule Commonly known as: EFFEXOR-XR TAKE 1 CAPSULE EVERY DAY  Vitamin D 50 MCG (2000 UT) Caps Take 2,000 Units by mouth every evening.        Allergies:  Allergies  Allergen Reactions   Sulfa Antibiotics Other (See Comments)    Unknown/childhood allergy unknown    Family History: Family History  Problem Relation Age of Onset   Breast cancer Mother    Colon cancer Neg Hx    Esophageal cancer Neg Hx    Stomach cancer Neg Hx    Rectal cancer Neg Hx     Social History:  reports that he quit smoking about 54 years ago. His smoking use included cigarettes. He has never used smokeless tobacco. He reports current alcohol use of about 12.0 standard drinks of alcohol per week. He reports that he does not use drugs.   Physical Exam: BP (!) 189/94   Pulse 61   Ht '5\' 11"'$  (1.803 m)   Wt 180 lb (81.6 kg)   BMI 25.10 kg/m   Constitutional:  Alert and oriented, No acute distress. HEENT: [place] AT, moist mucus membranes.  Trachea midline, no masses. Neurologic: Grossly intact, no focal deficits, moving all 4 extremities. Psychiatric: Normal mood and affect.   Ultrasound renal complete  Narrative CLINICAL DATA:  flank pain, history of LEFT-sided  hydronephrosis  EXAM: RENAL / URINARY TRACT ULTRASOUND COMPLETE  COMPARISON:  July 03, 2022  FINDINGS: Right Kidney:  Renal measurements: 11.0 cm x 5.6 x 5.5 cm = volume: 177 mL. Echogenicity within normal limits. No mass or hydronephrosis visualized.  Left Kidney:  Renal measurements: 10.5 x 5.0 x 5.2 cm = volume: 192 mL. Echogenicity within normal limits. There is cortical thinning. No mass is visualized. Previously described hydronephrosis is not discretely visualized sonographically. There is a small amount of perinephric fluid.  Bladder:  Bladder is trabeculated in appearance with thickened walls.  Other:  Incidental note of a hepatic cyst in the RIGHT liver (for which no dedicated imaging follow-up is recommended)  IMPRESSION: 1. Previously described LEFT-sided hydronephrosis is not discretely visualized sonographically. There is a small amount of LEFT perinephric fluid, nonspecific. 2. Bladder is trabeculated in appearance with thickened walls. This could reflect sequela of chronic outlet obstruction. Recommend correlation with urinalysis.   Electronically Signed By: Valentino Saxon M.D. On: 01/09/2023 11:49  This was personally reviewed and I agree with the radiologic interpretation.   Assessment & Plan:    Left hydronephrosis - Significantly improved. - Etiology remains unclear.  - Possibly chronic. -We still recommend conservative management and follow up in a year with a repeat renal ultrasound.  Return in about 1 year (around 01/29/2024) for repeat renal ultrasound.  [facility] [address] [place], [place] [address] [phone]

## 2023-02-23 DIAGNOSIS — Z0100 Encounter for examination of eyes and vision without abnormal findings: Secondary | ICD-10-CM | POA: Diagnosis not present

## 2023-02-23 DIAGNOSIS — H2513 Age-related nuclear cataract, bilateral: Secondary | ICD-10-CM | POA: Diagnosis not present

## 2023-02-23 DIAGNOSIS — H353131 Nonexudative age-related macular degeneration, bilateral, early dry stage: Secondary | ICD-10-CM | POA: Diagnosis not present

## 2023-03-02 ENCOUNTER — Other Ambulatory Visit: Payer: Self-pay

## 2023-03-02 DIAGNOSIS — I480 Paroxysmal atrial fibrillation: Secondary | ICD-10-CM

## 2023-03-02 MED ORDER — METOPROLOL TARTRATE 25 MG PO TABS: 25.0000 mg | ORAL_TABLET | Freq: Two times a day (BID) | ORAL | 1 refills | Status: DC

## 2023-03-04 ENCOUNTER — Other Ambulatory Visit: Payer: Self-pay | Admitting: Physician Assistant

## 2023-03-04 DIAGNOSIS — F325 Major depressive disorder, single episode, in full remission: Secondary | ICD-10-CM

## 2023-03-09 ENCOUNTER — Ambulatory Visit: Payer: Medicare HMO | Admitting: Cardiology

## 2023-03-12 ENCOUNTER — Encounter: Payer: Self-pay | Admitting: Gastroenterology

## 2023-04-01 NOTE — Progress Notes (Unsigned)
Established patient visit   Patient: Eugene White   DOB: [birth year]   77 y.o. Male  MRN: [id number] Visit Date: 04/02/2023  Today's healthcare provider: [name], PA-C   No chief complaint on file.  Subjective    HPI  Hypertension, follow-up  BP Readings from Last 3 Encounters:  01/28/23 (!) 189/94  10/01/22 130/70  09/17/22 (!) 159/91   Wt Readings from Last 3 Encounters:  01/28/23 180 lb (81.6 kg)  10/01/22 179 lb 9.6 oz (81.5 kg)  09/17/22 183 lb (83 kg)     He was last seen for hypertension 6 months ago.  BP at that visit was 130/70. Management since that visit includes advised pt to check more frequently at home and f/u if elevated .  He reports {excellent/good/fair/poor:19665} compliance with treatment. He {is/is not:9024} having side effects. {document side effects if present:1} He is following a {diet:21022986} diet. He {is/is not:9024} exercising. He {does/does not:200015} smoke.  Use of agents associated with hypertension: {bp agents assoc with hypertension:511::"none"}.   Outside blood pressures are {***enter patient reported home BP readings, or 'not being checked':1}. Symptoms: {Yes/No:20286} chest pain {Yes/No:20286} chest pressure  {Yes/No:20286} palpitations {Yes/No:20286} syncope  {Yes/No:20286} dyspnea {Yes/No:20286} orthopnea  {Yes/No:20286} paroxysmal nocturnal dyspnea {Yes/No:20286} lower extremity edema   Pertinent labs Lab Results  Component Value Date   CHOL 155 01/23/2022   HDL 66 01/23/2022   LDLCALC 69 01/23/2022   TRIG 116 01/23/2022   CHOLHDL 2.3 01/23/2022   Lab Results  Component Value Date   NA 138 07/18/2022   K 3.7 07/18/2022   CREATININE 0.96 07/18/2022   GFRNONAA >60 07/18/2022   GLUCOSE 104 (H) 07/18/2022   TSH 0.612 08/21/2022     The 10-year ASCVD risk score (Arnett DK, et al., 2019) is: 48.2%  ---------------------------------------------------------------------------------------------------   Depression, Follow-up  He  was last seen for this 6 months ago. Changes made at last visit include continue current medications.   He reports {excellent/good/fair/poor:19665} compliance with treatment. He {is/is not:21021397} having side effects. ***  He reports {DESC; GOOD/FAIR/POOR:18685} tolerance of treatment. Current symptoms include: {Symptoms; depression:1002} He feels he is {improved/worse/unchanged:3041574} since last visit.     10/01/2022    3:58 PM 06/26/2022    2:26 PM 02/14/2022    1:19 PM  Depression screen PHQ 2/9  Decreased Interest 0 0 0  Down, Depressed, Hopeless 0 0 0  PHQ - 2 Score 0 0 0  Altered sleeping 3 3 3   Tired, decreased energy 0 2 0  Change in appetite 0 0 0  Feeling bad or failure about yourself  0 0 0  Trouble concentrating 0 0 0  Moving slowly or fidgety/restless 0 0 0  Suicidal thoughts 0 0 0  PHQ-9 Score 3 5 3   Difficult doing work/chores Not difficult at all Not difficult at all Not difficult at all    -----------------------------------------------------------------------------------------   Medications: Outpatient Medications Prior to Visit  Medication Sig   amoxicillin (AMOXIL) 500 MG capsule Take 4 capsules by mouth as directed. Before dental appointment   apixaban (ELIQUIS) 5 MG TABS tablet Take 1 tablet (5 mg total) by mouth 2 (two) times daily.   atorvastatin (LIPITOR) 20 MG tablet TAKE 1 TABLET (20 MG TOTAL) BY MOUTH DAILY.   Cholecalciferol (VITAMIN D) 2000 UNITS CAPS Take 2,000 Units by mouth every evening.    diphenhydramine-acetaminophen (TYLENOL PM) 25-500 MG TABS tablet Take 4 tablets by mouth at bedtime as needed (sleep).   ibuprofen (  ADVIL) 200 MG tablet Take 800 mg by mouth daily as needed for moderate pain.   levothyroxine (SYNTHROID) 100 MCG tablet Take 1 tablet (100 mcg total) by mouth daily.   magnesium oxide (MAG-OX) 400 (240 Mg) MG tablet TAKE 1 TABLET TWICE DAILY .   Menthol, Topical Analgesic, (BLUE-EMU MAXIMUM  STRENGTH EX) Apply 1 application  topically at bedtime as needed (pain).   metoprolol tartrate (LOPRESSOR) 25 MG tablet Take 1 tablet (25 mg total) by mouth 2 (two) times daily.   Multiple Vitamin (MULTIVITAMIN WITH MINERALS) TABS tablet Take 1 tablet by mouth daily.    Multiple Vitamins-Minerals (PRESERVISION AREDS 2) CAPS Take 1 capsule by mouth 2 (two) times daily.    pantoprazole (PROTONIX) 40 MG tablet TAKE 1 TABLET BY MOUTH EVERY DAY   potassium chloride SA (KLOR-CON M20) 20 MEQ tablet Take 2 tablets (40 mEq total) by mouth daily.   venlafaxine XR (EFFEXOR-XR) 75 MG 24 hr capsule TAKE 1 CAPSULE EVERY DAY   No facility-administered medications prior to visit.    Review of Systems  {Labs  Heme  Chem  Endocrine  Serology  Results Review (optional):23779}   Objective    There were no vitals taken for this visit. {Show previous vital signs (optional):23777}  Physical Exam  ***  No results found for any visits on 04/02/23.  Assessment & Plan     ***  No follow-ups on file.      {provider attestation***:1}   [name], PA-C  [place] [phone] (phone) [phone] (fax)  [place]

## 2023-04-02 ENCOUNTER — Ambulatory Visit (INDEPENDENT_AMBULATORY_CARE_PROVIDER_SITE_OTHER): Payer: Medicare HMO | Admitting: Physician Assistant

## 2023-04-02 ENCOUNTER — Encounter: Payer: Self-pay | Admitting: Physician Assistant

## 2023-04-02 VITALS — BP 168/100 | HR 63 | Temp 99.1°F | Resp 52 | Wt 198.2 lb

## 2023-04-02 DIAGNOSIS — E7800 Pure hypercholesterolemia, unspecified: Secondary | ICD-10-CM | POA: Diagnosis not present

## 2023-04-02 DIAGNOSIS — R739 Hyperglycemia, unspecified: Secondary | ICD-10-CM | POA: Diagnosis not present

## 2023-04-02 DIAGNOSIS — I10 Essential (primary) hypertension: Secondary | ICD-10-CM | POA: Diagnosis not present

## 2023-04-02 DIAGNOSIS — R531 Weakness: Secondary | ICD-10-CM | POA: Diagnosis not present

## 2023-04-02 MED ORDER — LOSARTAN POTASSIUM 25 MG PO TABS: 25.0000 mg | ORAL_TABLET | Freq: Every day | ORAL | 1 refills | Status: DC

## 2023-04-02 NOTE — Assessment & Plan Note (Signed)
Fairly elevated today and per pt elevated at home Continue metoprolol, unable to increase 2/2 heart rate. Added losartan 25 mg today f/u 6-8 weeks

## 2023-04-02 NOTE — Assessment & Plan Note (Signed)
Pt requesting testosterone testing Advised 2/2 to age and deconditioning

## 2023-04-02 NOTE — Assessment & Plan Note (Signed)
Repeat fasting lipids Managed with lipitor 20 mg

## 2023-04-04 LAB — COMPREHENSIVE METABOLIC PANEL
ALT: 18 IU/L (ref 0–44)
AST: 28 IU/L (ref 0–40)
Albumin/Globulin Ratio: 2 (ref 1.2–2.2)
Albumin: 4.4 g/dL (ref 3.8–4.8)
Alkaline Phosphatase: 92 IU/L (ref 44–121)
BUN/Creatinine Ratio: 12 (ref 10–24)
BUN: 12 mg/dL (ref 8–27)
Bilirubin Total: 0.9 mg/dL (ref 0.0–1.2)
CO2: 22 mmol/L (ref 20–29)
Calcium: 9.8 mg/dL (ref 8.6–10.2)
Chloride: 99 mmol/L (ref 96–106)
Creatinine, Ser: 0.97 mg/dL (ref 0.76–1.27)
Globulin, Total: 2.2 g/dL (ref 1.5–4.5)
Glucose: 109 mg/dL — ABNORMAL HIGH (ref 70–99)
Potassium: 4.6 mmol/L (ref 3.5–5.2)
Sodium: 138 mmol/L (ref 134–144)
Total Protein: 6.6 g/dL (ref 6.0–8.5)
eGFR: 80 mL/min/{1.73_m2} (ref >59)

## 2023-04-04 LAB — CBC WITH DIFFERENTIAL/PLATELET
Basophils Absolute: 0 10*3/uL (ref 0.0–0.2)
Basos: 1 %
EOS (ABSOLUTE): 0.1 10*3/uL (ref 0.0–0.4)
Eos: 2 %
Hematocrit: 42.4 % (ref 37.5–51.0)
Hemoglobin: 13.9 g/dL (ref 13.0–17.7)
Immature Grans (Abs): 0 10*3/uL (ref 0.0–0.1)
Immature Granulocytes: 0 %
Lymphocytes Absolute: 1.5 10*3/uL (ref 0.7–3.1)
Lymphs: 26 %
MCH: 35.7 pg — ABNORMAL HIGH (ref 26.6–33.0)
MCHC: 32.8 g/dL (ref 31.5–35.7)
MCV: 109 fL — ABNORMAL HIGH (ref 79–97)
Monocytes Absolute: 0.7 10*3/uL (ref 0.1–0.9)
Monocytes: 11 %
Neutrophils Absolute: 3.4 10*3/uL (ref 1.4–7.0)
Neutrophils: 60 %
Platelets: 241 10*3/uL (ref 150–450)
RBC: 3.89 x10E6/uL — ABNORMAL LOW (ref 4.14–5.80)
RDW: 13.1 % (ref 11.6–15.4)
WBC: 5.7 10*3/uL (ref 3.4–10.8)

## 2023-04-04 LAB — TESTOSTERONE,FREE AND TOTAL
Testosterone, Free: 5.5 pg/mL — ABNORMAL LOW (ref 6.6–18.1)
Testosterone: 461 ng/dL (ref 264–916)

## 2023-04-04 LAB — HEMOGLOBIN A1C
Est. average glucose Bld gHb Est-mCnc: 85 mg/dL
Hgb A1c MFr Bld: 4.6 % — ABNORMAL LOW (ref 4.8–5.6)

## 2023-04-04 LAB — LIPID PANEL
Chol/HDL Ratio: 2.6 ratio (ref 0.0–5.0)
Cholesterol, Total: 158 mg/dL (ref 100–199)
HDL: 61 mg/dL (ref >39)
LDL Chol Calc (NIH): 75 mg/dL (ref 0–99)
Triglycerides: 126 mg/dL (ref 0–149)
VLDL Cholesterol Cal: 22 mg/dL (ref 5–40)

## 2023-04-22 ENCOUNTER — Other Ambulatory Visit: Payer: Self-pay | Admitting: Physician Assistant

## 2023-04-22 DIAGNOSIS — K219 Gastro-esophageal reflux disease without esophagitis: Secondary | ICD-10-CM

## 2023-04-27 ENCOUNTER — Ambulatory Visit: Payer: Medicare HMO | Admitting: Cardiology

## 2023-04-28 DIAGNOSIS — Z0100 Encounter for examination of eyes and vision without abnormal findings: Secondary | ICD-10-CM | POA: Diagnosis not present

## 2023-05-14 ENCOUNTER — Ambulatory Visit (INDEPENDENT_AMBULATORY_CARE_PROVIDER_SITE_OTHER): Payer: Medicare HMO | Admitting: Physician Assistant

## 2023-05-14 ENCOUNTER — Encounter: Payer: Self-pay | Admitting: Physician Assistant

## 2023-05-14 VITALS — BP 113/79 | HR 64 | Ht 71.0 in | Wt 179.0 lb

## 2023-05-14 DIAGNOSIS — I10 Essential (primary) hypertension: Secondary | ICD-10-CM | POA: Diagnosis not present

## 2023-05-14 NOTE — Progress Notes (Signed)
Established patient visit   Patient: Eugene White   DOB: 21-Oct-1946   77 y.o. Male  MRN: [id number] Visit Date: 05/14/2023  Today's healthcare provider: Alfredia Ferguson, PA-C   Cc. HTN f/u   Subjective    HPI  Hypertension, follow-up  BP Readings from Last 3 Encounters:  05/14/23 113/79  04/02/23 (!) 168/100  01/28/23 (!) 189/94   Wt Readings from Last 3 Encounters:  05/14/23 179 lb (81.2 kg)  04/02/23 198 lb 3.2 oz (89.9 kg)  01/28/23 180 lb (81.6 kg)     He was last seen for hypertension 6 weeks ago.  BP at that visit was 168/100. Management since that visit includes continue metoprolol and add losartan 25 mg.  Pertinent labs Lab Results  Component Value Date   CHOL 158 04/02/2023   HDL 61 04/02/2023   LDLCALC 75 04/02/2023   TRIG 126 04/02/2023   CHOLHDL 2.6 04/02/2023   Lab Results  Component Value Date   NA 138 04/02/2023   K 4.6 04/02/2023   CREATININE 0.97 04/02/2023   EGFR 80 04/02/2023   GLUCOSE 109 (H) 04/02/2023   TSH 0.612 08/21/2022     The 10-year ASCVD risk score (Arnett DK, et al., 2019) is: 23.6%  ---------------------------------------------------------------------------------------------------   Medications: Outpatient Medications Prior to Visit  Medication Sig   apixaban (ELIQUIS) 5 MG TABS tablet Take 1 tablet (5 mg total) by mouth 2 (two) times daily.   atorvastatin (LIPITOR) 20 MG tablet TAKE 1 TABLET (20 MG TOTAL) BY MOUTH DAILY.   Cholecalciferol (VITAMIN D) 2000 UNITS CAPS Take 2,000 Units by mouth every evening.    diphenhydramine-acetaminophen (TYLENOL PM) 25-500 MG TABS tablet Take 4 tablets by mouth at bedtime as needed (sleep).   ibuprofen (ADVIL) 200 MG tablet Take 800 mg by mouth daily as needed for moderate pain.   levothyroxine (SYNTHROID) 100 MCG tablet Take 1 tablet (100 mcg total) by mouth daily.   losartan (COZAAR) 25 MG tablet Take 1 tablet (25 mg total) by mouth daily.   magnesium oxide (MAG-OX) 400 (240  Mg) MG tablet TAKE 1 TABLET TWICE DAILY .   Menthol, Topical Analgesic, (BLUE-EMU MAXIMUM STRENGTH EX) Apply 1 application  topically at bedtime as needed (pain).   metoprolol tartrate (LOPRESSOR) 25 MG tablet Take 1 tablet (25 mg total) by mouth 2 (two) times daily.   Multiple Vitamin (MULTIVITAMIN WITH MINERALS) TABS tablet Take 1 tablet by mouth daily.    Multiple Vitamins-Minerals (PRESERVISION AREDS 2) CAPS Take 1 capsule by mouth 2 (two) times daily.    pantoprazole (PROTONIX) 40 MG tablet TAKE 1 TABLET EVERY DAY   potassium chloride SA (KLOR-CON M20) 20 MEQ tablet Take 2 tablets (40 mEq total) by mouth daily.   venlafaxine XR (EFFEXOR-XR) 75 MG 24 hr capsule TAKE 1 CAPSULE EVERY DAY   amoxicillin (AMOXIL) 500 MG capsule Take 4 capsules by mouth as directed. Before dental appointment (Patient not taking: Reported on 05/14/2023)   No facility-administered medications prior to visit.    Review of Systems  Constitutional:  Negative for fatigue and fever.  Respiratory:  Negative for cough and shortness of breath.   Cardiovascular:  Negative for chest pain, palpitations and leg swelling.  Neurological:  Negative for dizziness and headaches.      Objective    BP 113/79 (BP Location: Right Arm, Patient Position: Sitting, Cuff Size: Normal)   Pulse 64   Ht 5\' 11"  (1.803 m)   Wt 179 lb (81.2  kg)   SpO2 98%   BMI 24.97 kg/m   Physical Exam Vitals reviewed.  Constitutional:      Appearance: He is not ill-appearing.  HENT:     Head: Normocephalic.  Eyes:     Conjunctiva/sclera: Conjunctivae normal.  Cardiovascular:     Rate and Rhythm: Normal rate.  Pulmonary:     Effort: Pulmonary effort is normal. No respiratory distress.  Neurological:     General: No focal deficit present.     Mental Status: He is alert and oriented to person, place, and time.  Psychiatric:        Mood and Affect: Mood normal.        Behavior: Behavior normal.     No results found for any visits on  05/14/23.  Assessment & Plan     Problem List Items Addressed This Visit       Cardiovascular and Mediastinum   Essential (primary) hypertension - Primary    Improved with addition of losartan 25 mg  F/u 6 mo         Return in about 6 months (around 11/14/2023), or if symptoms worsen or fail to improve, for CPE, AVW.      I, Alfredia Ferguson, PA-C have reviewed all documentation for this visit. The documentation on  05/14/23   for the exam, diagnosis, procedures, and orders are all accurate and complete.  Alfredia Ferguson, PA-C [facility] [address] [place], [place], [address] Office: [phone] Fax: [phone]   [facility]

## 2023-05-14 NOTE — Assessment & Plan Note (Signed)
Improved with addition of losartan 25 mg  F/u 6 mo

## 2023-05-21 ENCOUNTER — Encounter: Payer: Self-pay | Admitting: Physician Assistant

## 2023-05-21 ENCOUNTER — Ambulatory Visit: Payer: Medicare HMO | Attending: Cardiology | Admitting: Physician Assistant

## 2023-05-21 VITALS — BP 130/90 | HR 64 | Ht 71.0 in | Wt 179.6 lb

## 2023-05-21 DIAGNOSIS — I340 Nonrheumatic mitral (valve) insufficiency: Secondary | ICD-10-CM

## 2023-05-21 DIAGNOSIS — I071 Rheumatic tricuspid insufficiency: Secondary | ICD-10-CM | POA: Diagnosis not present

## 2023-05-21 DIAGNOSIS — I4891 Unspecified atrial fibrillation: Secondary | ICD-10-CM | POA: Diagnosis not present

## 2023-05-21 DIAGNOSIS — I10 Essential (primary) hypertension: Secondary | ICD-10-CM

## 2023-05-21 DIAGNOSIS — I779 Disorder of arteries and arterioles, unspecified: Secondary | ICD-10-CM | POA: Diagnosis not present

## 2023-05-21 NOTE — Patient Instructions (Signed)
Medication Instructions:  Your physician recommends that you continue on your current medications as directed. Please refer to the Current Medication list given to you today.  *If you need a refill on your cardiac medications before your next appointment, please call your pharmacy*  Lab Work: None ordered If you have labs (blood work) drawn today and your tests are completely normal, you will receive your results only by: MyChart Message (if you have MyChart) OR A paper copy in the mail If you have any lab test that is abnormal or we need to change your treatment, we will call you to review the results.  Follow-Up: At [facility], you and your health needs are our priority.  As part of our continuing mission to provide you with exceptional heart care, we have created designated Provider Care Teams.  These Care Teams include your primary Cardiologist (physician) and Advanced Practice Providers (APPs -  Physician Assistants and Nurse Practitioners) who all work together to provide you with the care you need, when you need it.  Your next appointment:   1 year(s)  Provider:   Meriam Sprague, MD    Low-Sodium Eating Plan Salt (sodium) helps you keep a healthy balance of fluids in your body. Too much sodium can raise your blood pressure. It can also cause fluid and waste to be held in your body. Your health care provider or dietitian may recommend a low-sodium eating plan if you have high blood pressure (hypertension), kidney disease, liver disease, or heart failure. Eating less sodium can help lower your blood pressure and reduce swelling. It can also protect your heart, liver, and kidneys. What are tips for following this plan? Reading food labels  Check food labels for the amount of sodium per serving. If you eat more than one serving, you must multiply the listed amount by the number of servings. Choose foods with less than 140 milligrams (mg) of sodium per serving. Avoid  foods with 300 mg of sodium or more per serving. Always check how much sodium is in a product, even if the label says "unsalted" or "no salt added." Shopping  Buy products labeled as "low-sodium" or "no salt added." Buy fresh foods. Avoid canned foods and pre-made or frozen meals. Avoid canned, cured, or processed meats. Buy breads that have less than 80 mg of sodium per slice. Cooking  Eat more home-cooked food. Try to eat less restaurant, buffet, and fast food. Try not to add salt when you cook. Use salt-free seasonings or herbs instead of table salt or sea salt. Check with your provider or pharmacist before using salt substitutes. Cook with plant-based oils, such as canola, sunflower, or olive oil. Meal planning When eating at a restaurant, ask if your food can be made with less salt or no salt. Avoid dishes labeled as brined, pickled, cured, or smoked. Avoid dishes made with soy sauce, miso, or teriyaki sauce. Avoid foods that have monosodium glutamate (MSG) in them. MSG may be added to some restaurant food, sauces, soups, bouillon, and canned foods. Make meals that can be grilled, baked, poached, roasted, or steamed. These are often made with less sodium. General information Try to limit your sodium intake to 1,500-2,300 mg each day, or the amount told by your provider. What foods should I eat? Fruits Fresh, frozen, or canned fruit. Fruit juice. Vegetables Fresh or frozen vegetables. "No salt added" canned vegetables. "No salt added" tomato sauce and paste. Low-sodium or reduced-sodium tomato and vegetable juice. Grains Low-sodium cereals,  such as oats, puffed wheat and rice, and shredded wheat. Low-sodium crackers. Unsalted rice. Unsalted pasta. Low-sodium bread. Whole grain breads and whole grain pasta. Meats and other proteins Fresh or frozen meat, poultry, seafood, and fish. These should have no added salt. Low-sodium canned tuna and salmon. Unsalted nuts. Dried peas, beans, and  lentils without added salt. Unsalted canned beans. Eggs. Unsalted nut butters. Dairy Milk. Soy milk. Cheese that is naturally low in sodium, such as ricotta cheese, fresh mozzarella, or Swiss cheese. Low-sodium or reduced-sodium cheese. Cream cheese. Yogurt. Seasonings and condiments Fresh and dried herbs and spices. Salt-free seasonings. Low-sodium mustard and ketchup. Sodium-free salad dressing. Sodium-free light mayonnaise. Fresh or refrigerated horseradish. Lemon juice. Vinegar. Other foods Homemade, reduced-sodium, or low-sodium soups. Unsalted popcorn and pretzels. Low-salt or salt-free chips. The items listed above may not be all the foods and drinks you can have. Talk to a dietitian to learn more. What foods should I avoid? Vegetables Sauerkraut, pickled vegetables, and relishes. Olives. [place] fries. Onion rings. Regular canned vegetables, except low-sodium or reduced-sodium items. Regular canned tomato sauce and paste. Regular tomato and vegetable juice. Frozen vegetables in sauces. Grains Instant hot cereals. Bread stuffing, pancake, and biscuit mixes. Croutons. Seasoned rice or pasta mixes. Noodle soup cups. Boxed or frozen macaroni and cheese. Regular salted crackers. Self-rising flour. Meats and other proteins Meat or fish that is salted, canned, smoked, spiced, or pickled. Precooked or cured meat, such as sausages or meat loaves. Tomasa Blase. Ham. Pepperoni. Hot dogs. Corned beef. Chipped beef. Salt pork. Jerky. Pickled herring, anchovies, and sardines. Regular canned tuna. Salted nuts. Dairy Processed cheese and cheese spreads. Hard cheeses. Cheese curds. Blue cheese. Feta cheese. String cheese. Regular cottage cheese. Buttermilk. Canned milk. Fats and oils Salted butter. Regular margarine. Ghee. Bacon fat. Seasonings and condiments Onion salt, garlic salt, seasoned salt, table salt, and sea salt. Canned and packaged gravies. Worcestershire sauce. Tartar sauce. Barbecue sauce. Teriyaki  sauce. Soy sauce, including reduced-sodium soy sauce. Steak sauce. Fish sauce. Oyster sauce. Cocktail sauce. Horseradish that you find on the shelf. Regular ketchup and mustard. Meat flavorings and tenderizers. Bouillon cubes. Hot sauce. Pre-made or packaged marinades. Pre-made or packaged taco seasonings. Relishes. Regular salad dressings. Salsa. Other foods Salted popcorn and pretzels. Corn chips and puffs. Potato and tortilla chips. Canned or dried soups. Pizza. Frozen entrees and pot pies. The items listed above may not be all the foods and drinks you should avoid. Talk to a dietitian to learn more. This information is not intended to replace advice given to you by your health care provider. Make sure you discuss any questions you have with your health care provider. Document Revised: 01/01/2023 Document Reviewed: 01/01/2023 Elsevier Patient Education  2024 [employer]  Heart-Healthy Eating Plan Many factors influence your heart health, including eating and exercise habits. Heart health is also called coronary health. Coronary risk increases with abnormal blood fat (lipid) levels. A heart-healthy eating plan includes limiting unhealthy fats, increasing healthy fats, limiting salt (sodium) intake, and making other diet and lifestyle changes. What is my plan? Your health care provider may recommend that: You limit your fat intake to _________% or less of your total calories each day. You limit your saturated fat intake to _________% or less of your total calories each day. You limit the amount of cholesterol in your diet to less than _________ mg per day. You limit the amount of sodium in your diet to less than _________ mg per day. What are tips for following  this plan? Cooking Cook foods using methods other than frying. Baking, boiling, grilling, and broiling are all good options. Other ways to reduce fat include: Removing the skin from poultry. Removing all visible fats from  meats. Steaming vegetables in water or broth. Meal planning  At meals, imagine dividing your plate into fourths: Fill one-half of your plate with vegetables and green salads. Fill one-fourth of your plate with whole grains. Fill one-fourth of your plate with lean protein foods. Eat 2-4 cups of vegetables per day. One cup of vegetables equals 1 cup (91 g) broccoli or cauliflower florets, 2 medium carrots, 1 large bell pepper, 1 large sweet potato, 1 large tomato, 1 medium white potato, 2 cups (150 g) raw leafy greens. Eat 1-2 cups of fruit per day. One cup of fruit equals 1 small apple, 1 large banana, 1 cup (237 g) mixed fruit, 1 large orange,  cup (82 g) dried fruit, 1 cup (240 mL) 100% fruit juice. Eat more foods that contain soluble fiber. Examples include apples, broccoli, carrots, beans, peas, and barley. Aim to get 25-30 g of fiber per day. Increase your consumption of legumes, nuts, and seeds to 4-5 servings per week. One serving of dried beans or legumes equals  cup (90 g) cooked, 1 serving of nuts is  oz (12 almonds, 24 pistachios, or 7 walnut halves), and 1 serving of seeds equals  oz (8 g). Fats Choose healthy fats more often. Choose monounsaturated and polyunsaturated fats, such as olive and canola oils, avocado oil, flaxseeds, walnuts, almonds, and seeds. Eat more omega-3 fats. Choose salmon, mackerel, sardines, tuna, flaxseed oil, and ground flaxseeds. Aim to eat fish at least 2 times each week. Check food labels carefully to identify foods with trans fats or high amounts of saturated fat. Limit saturated fats. These are found in animal products, such as meats, butter, and cream. Plant sources of saturated fats include palm oil, palm kernel oil, and coconut oil. Avoid foods with partially hydrogenated oils in them. These contain trans fats. Examples are stick margarine, some tub margarines, cookies, crackers, and other baked goods. Avoid fried foods. General information Eat  more home-cooked food and less restaurant, buffet, and fast food. Limit or avoid alcohol. Limit foods that are high in added sugar and simple starches such as foods made using white refined flour (white breads, pastries, sweets). Lose weight if you are overweight. Losing just 5-10% of your body weight can help your overall health and prevent diseases such as diabetes and heart disease. Monitor your sodium intake, especially if you have high blood pressure. Talk with your health care provider about your sodium intake. Try to incorporate more vegetarian meals weekly. What foods should I eat? Fruits All fresh, canned (in natural juice), or frozen fruits. Vegetables Fresh or frozen vegetables (raw, steamed, roasted, or grilled). Green salads. Grains Most grains. Choose whole wheat and whole grains most of the time. Rice and pasta, including brown rice and pastas made with whole wheat. Meats and other proteins Lean, well-trimmed beef, veal, pork, and lamb. Chicken and [place] without skin. All fish and shellfish. Wild duck, rabbit, pheasant, and venison. Egg whites or low-cholesterol egg substitutes. Dried beans, peas, lentils, and tofu. Seeds and most nuts. Dairy Low-fat or nonfat cheeses, including ricotta and mozzarella. Skim or 1% milk (liquid, powdered, or evaporated). Buttermilk made with low-fat milk. Nonfat or low-fat yogurt. Fats and oils Non-hydrogenated (trans-free) margarines. Vegetable oils, including soybean, sesame, sunflower, olive, avocado, peanut, safflower, corn, canola, and cottonseed. Salad  dressings or mayonnaise made with a vegetable oil. Beverages Water (mineral or sparkling). Coffee and tea. Unsweetened ice tea. Diet beverages. Sweets and desserts Sherbet, gelatin, and fruit ice. Small amounts of dark chocolate. Limit all sweets and desserts. Seasonings and condiments All seasonings and condiments. The items listed above may not be a complete list of foods and beverages  you can eat. Contact a dietitian for more options. What foods should I avoid? Fruits Canned fruit in heavy syrup. Fruit in cream or butter sauce. Fried fruit. Limit coconut. Vegetables Vegetables cooked in cheese, cream, or butter sauce. Fried vegetables. Grains Breads made with saturated or trans fats, oils, or whole milk. Croissants. Sweet rolls. Donuts. High-fat crackers, such as cheese crackers and chips. Meats and other proteins Fatty meats, such as hot dogs, ribs, sausage, bacon, rib-eye roast or steak. High-fat deli meats, such as salami and bologna. Caviar. Domestic duck and goose. Organ meats, such as liver. Dairy Cream, sour cream, cream cheese, and creamed cottage cheese. Whole-milk cheeses. Whole or 2% milk (liquid, evaporated, or condensed). Whole buttermilk. Cream sauce or high-fat cheese sauce. Whole-milk yogurt. Fats and oils Meat fat, or shortening. Cocoa butter, hydrogenated oils, palm oil, coconut oil, palm kernel oil. Solid fats and shortenings, including bacon fat, salt pork, lard, and butter. Nondairy cream substitutes. Salad dressings with cheese or sour cream. Beverages Regular sodas and any drinks with added sugar. Sweets and desserts Frosting. Pudding. Cookies. Cakes. Pies. Milk chocolate or white chocolate. Buttered syrups. Full-fat ice cream or ice cream drinks. The items listed above may not be a complete list of foods and beverages to avoid. Contact a dietitian for more information. Summary Heart-healthy meal planning includes limiting unhealthy fats, increasing healthy fats, limiting salt (sodium) intake and making other diet and lifestyle changes. Lose weight if you are overweight. Losing just 5-10% of your body weight can help your overall health and prevent diseases such as diabetes and heart disease. Focus on eating a balance of foods, including fruits and vegetables, low-fat or nonfat dairy, lean protein, nuts and legumes, whole grains, and heart-healthy oils  and fats. This information is not intended to replace advice given to you by your health care provider. Make sure you discuss any questions you have with your health care provider. Document Revised: 01/20/2022 Document Reviewed: 01/20/2022 Elsevier Patient Education  2024 [employer].

## 2023-05-21 NOTE — Progress Notes (Signed)
Office Visit    Patient Name: Eugene White Date of Encounter: 05/21/2023  PCP:  Burnett Corrente   [facility]  Cardiologist:  Meriam Sprague, MD  Advanced Practice Provider:  No care team member to display Electrophysiologist:  None   HPI    Eugene White is a 77 y.o. 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  He initially underwent surgery for left femoral nail removal in 2018.  During surgery he went into atrial fibrillation with RVR which was new for him.  2D echocardiogram showed normal LV function with EF 55 to 60% with mildly dilated left atrium and mild LVH.  Was started on Eliquis.  He eventually underwent TEE/DCCV 12/2017 with conversion to normal sinus rhythm.  TEE had shown moderate MR/TR as well.  Since then he had required additional DCCV 12/2018 for recurrence.  He has required medication adjustments since then.  He was ultimately placed on flecainide in 08/2019 and underwent repeat DCCV 08/2019.  He does not have prior ischemic assessment.  Dr. Shari Prows offered an ETT at office visit 12/2019 and patient declined at that time.  Prior carotid duplex 12/2017 showed 1 to 39% stenosis of the left, felt to be normal by Dr. Delton See.  At visit 12/2020 with Dr. Delton See the patient requested to go off flecainide since he had not had any recurrent issues with atrial fibrillation.  Repeat echocardiogram 12/2020 with LVEF 55 to 60%, mild LAE, mild MR/TR, aortic sclerosis without stenosis.  He was seen in the clinic for follow-up 03/13/2022 where he was feeling fatigued and unwell.  Was in atrial fibrillation at that time.  He underwent successful DCCV on 04/04/2022 and return to normal sinus rhythm.  He was last seen 08/29/2022 and at  that time denied chest pain, palpitations, shortness of breath, syncopal episodes.  He was evaluated in the ED for urologic concerns.  Specifically, presented the ED with left flank pain and was thought to be related to kidney stones.  Was found to have significant left-sided hydronephrosis with ureteral stenosis status post stent placement and renal bleeding from recent fall.  Hemoglobin dropped to 8.  He was in the hospital back and forth between atrial fibrillation normal sinus rhythm and eventually ended up in normal sinus rhythm.  Had recovered well from procedure.  Hemoglobin back to 14.  Was not on Eliquis due to hematuria postprocedure.  Otherwise doing well from a CV standpoint.  Today, he tells me that he is doing okay today. Getting on magnesium and potassium really helped him a lot. He did have a kidney bleed from a fall but his hemoglobin recovered and is now over 14. He tells me that his PCP started him on Losartan for his diastolic BP, which has helped even though its a little elevated today. He does still have some PVCs occasionally but not often. He lost 50 lbs over the last two years and that has helped his overall health and knee replacement. He still has a sharp mind and reads regularly. He has two daughters one is an MD and the other is a Runner, broadcasting/film/video. He sees them regularly at their beach house in [place].   Reports no shortness of breath nor dyspnea on exertion. Reports no chest pain, pressure, or tightness. No edema, orthopnea, PND. Reports  no palpitations.  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   First  degree AV block    GERD (gastroesophageal reflux disease)    GI symptom    had nausea / vomited once / frequent stools / getting better   Hyperlipidemia    Hypertension    Hypothyroidism    Mild carotid artery disease (HCC)    Mitral regurgitation    Mood changes    PAF (paroxysmal atrial fibrillation) (HCC)    Pneumonia    hx of   Tricuspid regurgitation    Tricuspid regurgitation    Past Surgical History:  Procedure Laterality Date   CARDIOVERSION N/A 01/12/2018   Procedure: CARDIOVERSION;  Surgeon: Lars Masson, MD;  Location: [facility] ENDOSCOPY;  Service: Cardiovascular;  Laterality: N/A;   CARDIOVERSION N/A 01/26/2019   Procedure: CARDIOVERSION;  Surgeon: Jake Bathe, MD;  Location: [facility] ENDOSCOPY;  Service: Cardiovascular;  Laterality: N/A;   CARDIOVERSION N/A 09/30/2019   Procedure: CARDIOVERSION;  Surgeon: Lewayne Bunting, MD;  Location: [facility] ENDOSCOPY;  Service: Cardiovascular;  Laterality: N/A;   CARDIOVERSION N/A 04/04/2022   Procedure: CARDIOVERSION;  Surgeon: Maisie Fus, MD;  Location: [facility] ENDOSCOPY;  Service: Cardiovascular;  Laterality: N/A;   COLONOSCOPY  2014   CYSTOSCOPY WITH STENT PLACEMENT Left 06/18/2022   Procedure: CYSTOSCOPY WITH STENT PLACEMENT;  Surgeon: Vanna Scotland, MD;  Location: [facility] ORS;  Service: Urology;  Laterality: Left;   CYSTOSCOPY WITH STENT PLACEMENT Left 07/21/2022   Procedure: CYSTOSCOPY WITH STENT EXCHANGE;  Surgeon: Vanna Scotland, MD;  Location: [facility] ORS;  Service: Urology;  Laterality: Left;   FEMUR IM NAIL Left 10/11/2016   Procedure: INTRAMEDULLARY (IM) NAIL FEMORAL;  Surgeon: Christena Flake, MD;  Location: [facility] ORS;  Service: Orthopedics;  Laterality: Left;   HAND SURGERY   1973 / 2010 / 2015   right to release tendons   HARDWARE REMOVAL Left 10/05/2017   Procedure: Removal of left femoral nail;  Surgeon: Durene Romans, MD;  Location: [facility] ORS;  Service: Orthopedics;  Laterality: Left;  90 mins   HEMATOMA EVACUATION Left 01/19/2018    Procedure: Irrigation and debridement, Evacuation of left total knee hematoma;  Surgeon: Durene Romans, MD;  Location: [facility] ORS;  Service: Orthopedics;  Laterality: Left;   KNEE ARTHROSCOPY  1984 & 2001   twice   NASAL SEPTUM SURGERY     removal Left femoral nail      10/05/17 Dr. Charlann Boxer   SHOULDER OPEN ROTATOR CUFF REPAIR  1999 & 2006   Right and left   TEE WITH CARDIOVERSION  01/12/2018   TEE WITHOUT CARDIOVERSION N/A 01/12/2018   Procedure: TRANSESOPHAGEAL ECHOCARDIOGRAM (TEE);  Surgeon: Lars Masson, MD;  Location: [facility] ENDOSCOPY;  Service: Cardiovascular;  Laterality: N/A;   TONSILLECTOMY     TOTAL KNEE ARTHROPLASTY Right 01/09/2015   Procedure: RIGHT TOTAL KNEE ARTHROPLASTY;  Surgeon: Shelda Pal, MD;  Location: [facility] ORS;  Service: Orthopedics;  Laterality: Right;   TOTAL KNEE ARTHROPLASTY Left 11/23/2017   Procedure: LEFT TOTAL KNEE ARTHROPLASTY;  Surgeon: Durene Romans, MD;  Location:  [facility] ORS;  Service: Orthopedics;  Laterality: Left;  90 mins   URETERAL BIOPSY Left 07/21/2022   Procedure: URETERAL BIOPSY;  Surgeon: Vanna Scotland, MD;  Location: [facility] ORS;  Service: Urology;  Laterality: Left;   URETEROSCOPY Left 07/21/2022   Procedure: URETEROSCOPY;  Surgeon: Vanna Scotland, MD;  Location: [facility] ORS;  Service: Urology;  Laterality: Left;    Allergies  Allergies  Allergen Reactions   Sulfa Antibiotics Other (See Comments)    Unknown/childhood allergy unknown    EKGs/Labs/Other Studies Reviewed:   The following studies were reviewed today: Cardiac Studies & Procedures       ECHOCARDIOGRAM  ECHOCARDIOGRAM COMPLETE 01/21/2021  Narrative ECHOCARDIOGRAM REPORT    Patient Name:   KAYCEE WINCKLER Date of Exam: 01/21/2021 Medical Rec #:  [id number]        Height:       71.0 in Accession #:    [id number]       Weight:       224.8 lb Date of Birth:  20-Aug-1946        BSA:          2.216 m Patient Age:    74 years         BP:           134/78 mmHg Patient Gender: M                 HR:           69 bpm. Exam Location:  [address]  Procedure: 2D Echo, Cardiac Doppler and Color Doppler  Indications:    I34.0 Mitral Valve Insufficiency I07.1 Tricuspid Valve Insufficiency  History:        Patient has prior history of Echocardiogram examinations, most recent 10/05/2017. Risk Factors:Hypertension and Dyslipidemia. Hypothyroidism. Pneumonia.  Sonographer:    Daphine Deutscher RDCS Referring Phys: [id number] Faustino Congress NELSON  IMPRESSIONS   1. Left ventricular ejection fraction, by estimation, is 55 to 60%. The left ventricle has normal function. The left ventricle has no regional wall motion abnormalities. Left ventricular diastolic function could not be evaluated. 2. Right ventricular systolic function is normal. The right ventricular size is normal. 3. Left atrial size was mildly dilated. 4. The mitral valve is normal in structure. Mild mitral valve regurgitation. No evidence of mitral stenosis. 5. The aortic valve is normal in structure. There is mild calcification of the aortic valve. There is moderate thickening of the aortic valve. Aortic valve regurgitation is not visualized. Mild to moderate aortic valve sclerosis/calcification is present, without any evidence of aortic stenosis. 6. The inferior vena cava is normal in size with greater than 50% respiratory variability, suggesting right atrial pressure of 3 mmHg.  Comparison(s): No significant change from prior study.  FINDINGS Left Ventricle: Left ventricular ejection fraction, by estimation, is 55 to 60%. The left ventricle has normal function. The left ventricle has no regional wall motion abnormalities. The left ventricular internal cavity size was normal in size. There is no left ventricular hypertrophy. Left ventricular diastolic function could not be evaluated due to atrial fibrillation. Left ventricular diastolic function could not be evaluated.  Right Ventricle: The right ventricular size is  normal. No increase in right ventricular wall thickness. Right ventricular systolic function is normal.  Left Atrium: Left atrial size was mildly dilated.  Right Atrium: Right atrial size was normal in size.  Pericardium: There is no evidence of pericardial effusion.  Mitral Valve: The mitral valve is normal in structure. Mild mitral valve  regurgitation. No evidence of mitral valve stenosis.  Tricuspid Valve: The tricuspid valve is normal in structure. Tricuspid valve regurgitation is mild . No evidence of tricuspid stenosis.  Aortic Valve: The aortic valve is normal in structure. There is mild calcification of the aortic valve. There is moderate thickening of the aortic valve. Aortic valve regurgitation is not visualized. Aortic regurgitation PHT measures 478 msec. Mild to moderate aortic valve sclerosis/calcification is present, without any evidence of aortic stenosis. Aortic valve mean gradient measures 7.0 mmHg. Aortic valve peak gradient measures 12.4 mmHg. Aortic valve area, by VTI measures 2.04 cm.  Pulmonic Valve: The pulmonic valve was normal in structure. Pulmonic valve regurgitation is not visualized. No evidence of pulmonic stenosis.  Aorta: The aortic root is normal in size and structure.  Venous: The inferior vena cava is normal in size with greater than 50% respiratory variability, suggesting right atrial pressure of 3 mmHg.  IAS/Shunts: No atrial level shunt detected by color flow Doppler.   LEFT VENTRICLE PLAX 2D LVIDd:         4.00 cm  Diastology LVIDs:         2.80 cm  LV e' medial:    4.46 cm/s LV PW:         1.20 cm  LV E/e' medial:  7.0 LV IVS:        1.20 cm  LV e' lateral:   8.81 cm/s LVOT diam:     2.20 cm  LV E/e' lateral: 3.5 LV SV:         73 LV SV Index:   33 LVOT Area:     3.80 cm   RIGHT VENTRICLE RV Basal diam:  5.10 cm RV S prime:     16.60 cm/s TAPSE (M-mode): 2.8 cm  LEFT ATRIUM             Index       RIGHT ATRIUM           Index LA  diam:        4.90 cm 2.21 cm/m  RA Area:     16.60 cm LA Vol (A2C):   72.1 ml 32.54 ml/m RA Volume:   46.00 ml  20.76 ml/m LA Vol (A4C):   93.6 ml 42.24 ml/m LA Biplane Vol: 83.1 ml 37.50 ml/m AORTIC VALVE AV Area (Vmax):    2.18 cm AV Area (Vmean):   1.96 cm AV Area (VTI):     2.04 cm AV Vmax:           176.00 cm/s AV Vmean:          126.000 cm/s AV VTI:            0.358 m AV Peak Grad:      12.4 mmHg AV Mean Grad:      7.0 mmHg LVOT Vmax:         101.05 cm/s LVOT Vmean:        65.100 cm/s LVOT VTI:          0.192 m LVOT/AV VTI ratio: 0.54 AI PHT:            478 msec  AORTA Ao Root diam: 3.50 cm Ao Asc diam:  3.30 cm  MITRAL VALVE               TRICUSPID VALVE MV Area (PHT): 3.85 cm    TR Peak grad:   31.8 mmHg MV Decel Time: 197 msec    TR Vmax:  282.00 cm/s MV E velocity: 31.00 cm/s MV A velocity: 76.60 cm/s  SHUNTS MV E/A ratio:  0.40        Systemic VTI:  0.19 m Systemic Diam: 2.20 cm  Tobias Alexander MD Electronically signed by Tobias Alexander MD Signature Date/Time: 01/21/2021/6:48:44 PM    Final   TEE  ECHO TEE 01/12/2018  Narrative *[place]* *[facility]* [address] [place], [place] [address] [phone]  ------------------------------------------------------------------- Transesophageal Echocardiography  Patient:    Travius, Tenner MR #:       [id number] Study Date: 01/12/2018 Gender:     M Age:        71 Height:     180.3 cm Weight:     92.3 kg BSA:        2.17 m^2 Pt. Status: Room:  SONOGRAPHER  Delcie Roch, RDCS, CCT ADMITTING    Tobias Alexander, M.D. ATTENDING    Tobias Alexander, M.D. ORDERING     Tobias Alexander, M.D. PERFORMING   Tobias Alexander, M.D. REFERRING    Tobias Alexander, M.D.  cc:  ------------------------------------------------------------------- LV EF: 55% -   60%  ------------------------------------------------------------------- Indications:      Atrial  fibrillation - 427.31.  ------------------------------------------------------------------- Study Conclusions  - Left ventricle: Systolic function was normal. The estimated ejection fraction was in the range of 55% to 60%. Wall motion was normal; there were no regional wall motion abnormalities. - Aortic valve: There was mild regurgitation. - Mitral valve: There was moderate regurgitation. - Left atrium: The atrium was dilated. No evidence of thrombus in the atrial cavity or appendage. No evidence of thrombus in the atrial cavity or appendage. No evidence of thrombus in the atrial cavity or appendage. - Right atrium: The atrium was dilated. No evidence of thrombus in the atrial cavity or appendage. - Atrial septum: No defect or patent foramen ovale was identified. - Tricuspid valve: There was moderate regurgitation.  Impressions:  - No cardiac source of emboli was indentified. The study was followed by a successful cardioversion.  ------------------------------------------------------------------- Study data:   Study status:  Routine.  Consent:  The risks, benefits, and alternatives to the procedure were explained to the patient and informed consent was obtained.  Procedure:  Initial setup. The patient was brought to the laboratory. Surface ECG leads were monitored. Sedation. Sedation was administered by anesthesiology staff. Transesophageal echocardiography. An adult multiplane transesophageal probe was inserted by the attending cardiologistwithout difficulty. Image quality was adequate. Intravenous contrast (agitated saline) was administered.  Study completion:  The patient tolerated the procedure well. There were no complications.          Diagnostic transesophageal echocardiography.  2D and color Doppler.  Birthdate:  Patient birthdate: June 22, 1946.  Age:  Patient is 77 yr old.  Sex:  Gender: male.    BMI: 28.4 kg/m^2.  Blood pressure:     145/97  Patient status:  Inpatient.   Study date:  Study date: 01/12/2018. Study time: 02:08 PM.  Location:  Endoscopy.  -------------------------------------------------------------------  ------------------------------------------------------------------- Left ventricle:  Systolic function was normal. The estimated ejection fraction was in the range of 55% to 60%. Wall motion was normal; there were no regional wall motion abnormalities.  ------------------------------------------------------------------- Aortic valve:   Trileaflet; mildly thickened, mildly calcified leaflets. Cusp separation was normal.  Doppler:  There was mild regurgitation.  ------------------------------------------------------------------- Aorta:  There was no atheroma. There was no evidence for dissection. Aortic root: The aortic root was not dilated. Ascending aorta: The ascending aorta was normal in size.  Descending aorta: The descending aorta was normal in size.  ------------------------------------------------------------------- Mitral valve:   Mildly thickened leaflets . Leaflet separation was normal.  Doppler:  There was moderate regurgitation.  ------------------------------------------------------------------- Left atrium:  The atrium was dilated.  No evidence of thrombus in the atrial cavity or appendage.  No evidence of thrombus in the atrial cavity or appendage.  No evidence of thrombus in the atrial cavity or appendage. The appendage was morphologically a left appendage, multilobulated, and of normal size. Emptying velocity was normal.  ------------------------------------------------------------------- Atrial septum:  No defect or patent foramen ovale was identified.  ------------------------------------------------------------------- Right ventricle:  The cavity size was normal. Wall thickness was normal. Systolic function was normal.  ------------------------------------------------------------------- Pulmonic valve:     Structurally normal valve.    Doppler:  There was mild regurgitation.  ------------------------------------------------------------------- Tricuspid valve:   Structurally normal valve.   Leaflet separation was normal.  Doppler:  There was moderate regurgitation.  ------------------------------------------------------------------- Pulmonary artery:   The main pulmonary artery was normal-sized.  ------------------------------------------------------------------- Right atrium:  The atrium was dilated.  No evidence of thrombus in the atrial cavity or appendage. The appendage was morphologically a right appendage.  ------------------------------------------------------------------- Pericardium:  There was no pericardial effusion.  ------------------------------------------------------------------- Prepared and Electronically Authenticated by  Tobias Alexander, M.D. 2019-01-15T16:12:42             EKG:  EKG is not ordered today.     Recent Labs: 08/21/2022: TSH 0.612 04/02/2023: ALT 18; BUN 12; Creatinine, Ser 0.97; Hemoglobin 13.9; Platelets 241; Potassium 4.6; Sodium 138  Recent Lipid Panel    Component Value Date/Time   CHOL 158 04/02/2023 1611   TRIG 126 04/02/2023 1611   HDL 61 04/02/2023 1611   CHOLHDL 2.6 04/02/2023 1611   CHOLHDL 3.8 10/11/2016 0337   VLDL UNABLE TO CALCULATE IF TRIGLYCERIDE OVER 400 mg/dL 16/09/9603 5409   LDLCALC 75 04/02/2023 1611    Risk Assessment/Calculations:   CHA2DS2-VASc Score = 3   This indicates a 3.2% annual risk of stroke. The patient's score is based upon: CHF History: 0 HTN History: 1 Diabetes History: 0 Stroke History: 0 Vascular Disease History: 0 Age Score: 2 Gender Score: 0    Home Medications   Current Meds  Medication Sig   amoxicillin (AMOXIL) 500 MG capsule Take 4 capsules by mouth as directed. Before dental appointment   apixaban (ELIQUIS) 5 MG TABS tablet Take 1 tablet (5 mg total) by mouth 2 (two) times daily.    atorvastatin (LIPITOR) 20 MG tablet TAKE 1 TABLET (20 MG TOTAL) BY MOUTH DAILY.   Cholecalciferol (VITAMIN D) 2000 UNITS CAPS Take 2,000 Units by mouth every evening.    diphenhydramine-acetaminophen (TYLENOL PM) 25-500 MG TABS tablet Take 4 tablets by mouth at bedtime as needed (sleep).   ibuprofen (ADVIL) 200 MG tablet Take 800 mg by mouth daily as needed for moderate pain.   levothyroxine (SYNTHROID) 100 MCG tablet Take 1 tablet (100 mcg total) by mouth daily.   losartan (COZAAR) 25 MG tablet Take 1 tablet (25 mg total) by mouth daily.   magnesium oxide (MAG-OX) 400 (240 Mg) MG tablet TAKE 1 TABLET TWICE DAILY .   Menthol, Topical Analgesic, (BLUE-EMU MAXIMUM STRENGTH EX) Apply 1 application  topically at bedtime as needed (pain).   metoprolol tartrate (LOPRESSOR) 25 MG tablet Take 1 tablet (25 mg total) by mouth 2 (two) times daily.   Multiple Vitamin (MULTIVITAMIN WITH MINERALS) TABS tablet Take 1 tablet by mouth daily.    Multiple Vitamins-Minerals (PRESERVISION  AREDS 2) CAPS Take 1 capsule by mouth 2 (two) times daily.    pantoprazole (PROTONIX) 40 MG tablet TAKE 1 TABLET EVERY DAY   potassium chloride SA (KLOR-CON M20) 20 MEQ tablet Take 2 tablets (40 mEq total) by mouth daily.   venlafaxine XR (EFFEXOR-XR) 75 MG 24 hr capsule TAKE 1 CAPSULE EVERY DAY     Review of Systems      All other systems reviewed and are otherwise negative except as noted above.  Physical Exam    VS:  BP (!) 130/90   Pulse 64   Ht 5\' 11"  (1.803 m)   Wt 179 lb 9.6 oz (81.5 kg)   SpO2 97%   BMI 25.05 kg/m  , BMI Body mass index is 25.05 kg/m.  Wt Readings from Last 3 Encounters:  05/21/23 179 lb 9.6 oz (81.5 kg)  05/14/23 179 lb (81.2 kg)  04/02/23 198 lb 3.2 oz (89.9 kg)     GEN: Well nourished, well developed, in no acute distress. HEENT: normal. Neck: Supple, no JVD, carotid bruits, or masses. Cardiac: RRR, no murmurs, rubs, or gallops. No clubbing, cyanosis, edema.  Radials/PT 2+ and equal  bilaterally.  Respiratory:  Respirations regular and unlabored, clear to auscultation bilaterally. GI: Soft, nontender, nondistended. MS: No deformity or atrophy. Skin: Warm and dry, no rash. Neuro:  Strength and sensation are intact. Psych: Normal affect.  Assessment & Plan    PAF -he is in NSR today, not an issue now that electrolytes are corrected -continue current medications: Eliquis 5 g twice a day, Lipitor 20 mg daily, losartan 25 mg daily, magnesium 400 mg twice a day, Lopressor 25 mg twice a day, potassium 40 mEq daily  Mild MR/TR -consider updating echo next year -last echo was 2022, reviewed with patient today -asymptomatic at this time  3. Hypertension -well controlled today, recently started on losartan by PCP Continue current medication- -continue to track blood pressure at home and if diastolic BP starts to increase please let us know  Carotid artery disease -Left carotid with mild disease, right carotid no evidence of stenosis (2019) -No bruit on exam and asymptomatic   Disposition: Follow up 1 year with Meriam Sprague, MD or APP.  Signed, Sharlene Dory, PA-C 05/21/2023, 4:39 PM [facility]

## 2023-06-29 ENCOUNTER — Other Ambulatory Visit: Payer: Self-pay | Admitting: Physician Assistant

## 2023-06-29 DIAGNOSIS — E039 Hypothyroidism, unspecified: Secondary | ICD-10-CM

## 2023-06-30 ENCOUNTER — Other Ambulatory Visit: Payer: Self-pay

## 2023-06-30 DIAGNOSIS — N1330 Unspecified hydronephrosis: Secondary | ICD-10-CM

## 2023-06-30 DIAGNOSIS — R319 Hematuria, unspecified: Secondary | ICD-10-CM

## 2023-06-30 MED ORDER — APIXABAN 5 MG PO TABS
5.0000 mg | ORAL_TABLET | Freq: Two times a day (BID) | ORAL | 3 refills | Status: DC
Start: 2023-06-30 — End: 2024-01-05

## 2023-06-30 NOTE — Telephone Encounter (Signed)
Prescription refill request for Eliquis received. Indication:afib Last office visit:9/23 Scr:0.97  4/24 Age: 77 Weight:81.5  kg  Prescription refilled

## 2023-07-13 ENCOUNTER — Ambulatory Visit: Payer: Medicare HMO

## 2023-07-13 VITALS — Ht 71.0 in | Wt 179.0 lb

## 2023-07-13 DIAGNOSIS — Z0000 Encounter for general adult medical examination without abnormal findings: Secondary | ICD-10-CM | POA: Diagnosis not present

## 2023-07-13 NOTE — Progress Notes (Signed)
Subjective:   Eugene White is a 77 y.o. male who presents for Medicare Annual/Subsequent preventive examination.  Visit Complete: Virtual  I connected with  Sharmon Leyden on 07/13/23 by a audio enabled telemedicine application and verified that I am speaking with the correct person using two identifiers.  Patient Location: Home  Provider Location: Office/Clinic  I discussed the limitations of evaluation and management by telemedicine. The patient expressed understanding and agreed to proceed.  Patient Medicare AWV questionnaire was completed by the patient on (not done); I have confirmed that all information answered by patient is correct and no changes since this date.  Review of Systems    Cardiac Risk Factors include: advanced age (>44men, >61 women);hypertension;male gender;sedentary lifestyle     Objective:    Today's Vitals   07/13/23 1545 07/13/23 1546  Weight: 179 lb (81.2 kg)   Height: 5\' 11"  (1.803 m)   PainSc:  3    Body mass index is 24.97 kg/m.     07/13/2023    4:02 PM 07/18/2022   10:28 AM 06/18/2022    3:12 PM 04/04/2022   10:41 AM 02/25/2022    2:02 PM 09/30/2019    9:07 AM 01/26/2019    8:34 AM  Advanced Directives  Does Patient Have a Medical Advance Directive? Yes Yes No Yes No Yes Yes  Type of Estate agent of [place];Living will     Healthcare Power of [employer] of [place];Living will  Copy of Healthcare Power of Attorney in Chart?      No - copy requested Yes - validated most recent copy scanned in chart (See row information)  Would patient like information on creating a medical advance directive?   No - Patient declined        Current Medications (verified) Outpatient Encounter Medications as of 07/13/2023  Medication Sig   amoxicillin (AMOXIL) 500 MG capsule Take 4 capsules by mouth as directed. Before dental appointment   apixaban (ELIQUIS) 5 MG TABS tablet Take 1 tablet (5 mg total) by mouth 2 (two)  times daily.   atorvastatin (LIPITOR) 20 MG tablet TAKE 1 TABLET (20 MG TOTAL) BY MOUTH DAILY.   Cholecalciferol (VITAMIN D) 2000 UNITS CAPS Take 2,000 Units by mouth every evening.    diphenhydramine-acetaminophen (TYLENOL PM) 25-500 MG TABS tablet Take 4 tablets by mouth at bedtime as needed (sleep).   ibuprofen (ADVIL) 200 MG tablet Take 800 mg by mouth daily as needed for moderate pain.   levothyroxine (SYNTHROID) 100 MCG tablet TAKE 1 TABLET (100 MCG TOTAL) BY MOUTH DAILY.   losartan (COZAAR) 25 MG tablet Take 1 tablet (25 mg total) by mouth daily.   magnesium oxide (MAG-OX) 400 (240 Mg) MG tablet TAKE 1 TABLET TWICE DAILY .   Menthol, Topical Analgesic, (BLUE-EMU MAXIMUM STRENGTH EX) Apply 1 application  topically at bedtime as needed (pain).   metoprolol tartrate (LOPRESSOR) 25 MG tablet Take 1 tablet (25 mg total) by mouth 2 (two) times daily.   Multiple Vitamin (MULTIVITAMIN WITH MINERALS) TABS tablet Take 1 tablet by mouth daily.    Multiple Vitamins-Minerals (PRESERVISION AREDS 2) CAPS Take 1 capsule by mouth 2 (two) times daily.    pantoprazole (PROTONIX) 40 MG tablet TAKE 1 TABLET EVERY DAY   potassium chloride SA (KLOR-CON M20) 20 MEQ tablet Take 2 tablets (40 mEq total) by mouth daily.   venlafaxine XR (EFFEXOR-XR) 75 MG 24 hr capsule TAKE 1 CAPSULE EVERY DAY   No facility-administered encounter  medications on file as of 07/13/2023.    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   First degree AV block    GERD (gastroesophageal reflux disease)    GI symptom    had nausea /  vomited once / frequent stools / getting better   Hyperlipidemia    Hypertension    Hypothyroidism    Mild carotid artery disease (HCC)    Mitral regurgitation    Mood changes    PAF (paroxysmal atrial fibrillation) (HCC)    Pneumonia    hx of   Tricuspid regurgitation    Tricuspid regurgitation    Past Surgical History:  Procedure Laterality Date   CARDIOVERSION N/A 01/12/2018   Procedure: CARDIOVERSION;  Surgeon: Lars Masson, MD;  Location: [facility] ENDOSCOPY;  Service: Cardiovascular;  Laterality: N/A;   CARDIOVERSION N/A 01/26/2019   Procedure: CARDIOVERSION;  Surgeon: Jake Bathe, MD;  Location: [facility] ENDOSCOPY;  Service: Cardiovascular;  Laterality: N/A;   CARDIOVERSION N/A 09/30/2019   Procedure: CARDIOVERSION;  Surgeon: Lewayne Bunting, MD;  Location: [facility] ENDOSCOPY;  Service: Cardiovascular;  Laterality: N/A;   CARDIOVERSION N/A 04/04/2022   Procedure: CARDIOVERSION;  Surgeon: Maisie Fus, MD;  Location: [facility] ENDOSCOPY;  Service: Cardiovascular;  Laterality: N/A;   COLONOSCOPY  2014   CYSTOSCOPY WITH STENT PLACEMENT Left 06/18/2022   Procedure: CYSTOSCOPY WITH STENT PLACEMENT;  Surgeon: Vanna Scotland, MD;  Location: [facility] ORS;  Service: Urology;  Laterality: Left;   CYSTOSCOPY WITH STENT PLACEMENT Left 07/21/2022   Procedure: CYSTOSCOPY WITH STENT EXCHANGE;  Surgeon: Vanna Scotland, MD;  Location: [facility] ORS;  Service: Urology;  Laterality: Left;   FEMUR IM NAIL Left 10/11/2016   Procedure: INTRAMEDULLARY (IM) NAIL FEMORAL;  Surgeon: Christena Flake, MD;  Location: [facility] ORS;  Service: Orthopedics;  Laterality: Left;   HAND SURGERY   1973 / 2010 / 2015   right to release tendons   HARDWARE REMOVAL Left 10/05/2017   Procedure: Removal of left femoral nail;  Surgeon: Durene Romans, MD;  Location: [facility] ORS;  Service: Orthopedics;  Laterality: Left;  90 mins   HEMATOMA EVACUATION Left 01/19/2018   Procedure: Irrigation and debridement, Evacuation of left total knee hematoma;  Surgeon:  Durene Romans, MD;  Location: [facility] ORS;  Service: Orthopedics;  Laterality: Left;   KNEE ARTHROSCOPY  1984 & 2001   twice   NASAL SEPTUM SURGERY     removal Left femoral nail      10/05/17 Dr. Charlann Boxer   SHOULDER OPEN ROTATOR CUFF REPAIR  1999 & 2006   Right and left   TEE WITH CARDIOVERSION  01/12/2018   TEE WITHOUT CARDIOVERSION N/A 01/12/2018   Procedure: TRANSESOPHAGEAL ECHOCARDIOGRAM (TEE);  Surgeon: Lars Masson, MD;  Location[facility] ENDOSCOPY;  Service: Cardiovascular;  Laterality: N/A;   TONSILLECTOMY     TOTAL KNEE ARTHROPLASTY Right 01/09/2015   Procedure: RIGHT TOTAL KNEE ARTHROPLASTY;  Surgeon: Shelda Pal, MD;  Location: [facility] ORS;  Service: Orthopedics;  Laterality: Right;   TOTAL KNEE ARTHROPLASTY Left 11/23/2017   Procedure: LEFT TOTAL KNEE ARTHROPLASTY;  Surgeon: Durene Romans, MD;  Location: [facility] ORS;  Service: Orthopedics;  Laterality: Left;  90 mins   URETERAL BIOPSY Left 07/21/2022   Procedure: URETERAL BIOPSY;  Surgeon: Vanna Scotland, MD;  Location: [facility] ORS;  Service: Urology;  Laterality: Left;   URETEROSCOPY Left 07/21/2022   Procedure: URETEROSCOPY;  Surgeon: Vanna Scotland, MD;  Location: [facility] ORS;  Service: Urology;  Laterality: Left;   Family History  Problem Relation Age of Onset   Breast cancer Mother    Colon cancer Neg Hx    Esophageal cancer Neg Hx    Stomach cancer Neg Hx    Rectal cancer Neg Hx    Social History   Socioeconomic History   Marital status: Married    Spouse name: Scientist, product/process development   Number of children: 2   Years of education: Not on file   Highest education level: Professional school degree (e.g., MD, DDS, DVM, JD)  Occupational History   Occupation: retired, has degree in physiology  Tobacco Use   Smoking status: Former    Current packs/day: 0.00    Types: Cigarettes    Quit date: 12/29/1968    Years since quitting: 54.5   Smokeless tobacco: Never  Vaping Use   Vaping status: Never Used  Substance and Sexual Activity   Alcohol use: Yes     Alcohol/week: 12.0 standard drinks of alcohol    Types: 12 Cans of beer per week    Comment: weekly   Drug use: No   Sexual activity: Yes  Other Topics Concern   Not on file  Social History Narrative   Pt lives in [place] w/ wife.   Social Determinants of Health   Financial Resource Strain: Low Risk  (10/08/2018)   Overall Financial Resource Strain (CARDIA)    Difficulty of Paying Living Expenses: Not hard at all  Food Insecurity: No Food Insecurity (07/13/2023)   Hunger Vital Sign    Worried About Running Out of Food in the Last Year: Never true    Ran Out of Food in the Last Year: Never true  Transportation Needs: No Transportation Needs (07/13/2023)   PRAPARE - Administrator, Civil Service (Medical): No    Lack of Transportation (Non-Medical): No  Physical Activity: Inactive (07/13/2023)   Exercise Vital Sign    Days of Exercise per Week: 0 days    Minutes of Exercise per Session: 0 min  Stress: No Stress Concern Present (07/13/2023)   [employer] of Occupational Health - Occupational Stress Questionnaire    Feeling of Stress : Not at all  Social Connections: Socially Isolated (07/13/2023)   Social Connection and Isolation Panel [NHANES]    Frequency of Communication with Friends and Family: Twice a week    Frequency of Social Gatherings with Friends and Family: Never    Attends Religious Services: Never    Database administrator or Organizations: No    Attends Engineer, structural: Never    Marital Status: Married    Tobacco Counseling Counseling given: Not Answered   Clinical Intake:  Pre-visit preparation completed: Yes  Pain : 0-10 Pain Score: 3  Pain Type: Chronic pain Pain Location: Hip (thighs) Pain Descriptors / Indicators: Aching Pain Onset: More than a month ago Pain Frequency: Intermittent Pain Relieving Factors: heat, tylenol pm  Pain Relieving Factors: heat, tylenol pm  BMI - recorded: 24.97 Nutritional Status:  BMI of 19-24  Normal Nutritional Risks: None Diabetes: No  How often do you need to have someone  help you when you read instructions, pamphlets, or other written materials from your doctor or pharmacy?: 1 - Never  Interpreter Needed?: No  Comments: lives with wife Information entered by :: B.Jalayiah Bibian,LPN   Activities of Daily Living    07/13/2023    4:02 PM 04/02/2023    4:11 PM  In your present state of health, do you have any difficulty performing the following activities:  Hearing? 0 0  Vision? 0 0  Difficulty concentrating or making decisions? 0 0  Walking or climbing stairs? 1 1  Dressing or bathing? 0 0  Doing errands, shopping? 0 0  Preparing Food and eating ? N   Using the Toilet? N   In the past six months, have you accidently leaked urine? N   Do you have problems with loss of bowel control? N   Managing your Medications? N   Managing your Finances? N   Housekeeping or managing your Housekeeping? N     Patient Care Team: Alfredia Ferguson, PA-C as PCP - General (Physician Assistant) Meriam Sprague, MD as PCP - Cardiology (Cardiology) Dingeldein, Viviann Spare, MD as Consulting Physician (Ophthalmology) Durene Romans, MD as Consulting Physician (Orthopedic Surgery) Meriam Sprague, MD as Consulting Physician (Cardiology)  Indicate any recent Medical Services you may have received from other than [facility] providers in the past year (date may be approximate).     Assessment:   This is a routine wellness examination for Tylyn.  Hearing/Vision screen Hearing Screening - Comments:: Adequate hearing Vision Screening - Comments:: Adequate vision w/glasses Dr Dellie Burns  Dietary issues and exercise activities discussed:     Goals Addressed             This Visit's Progress    Exercise 3x per week (30 min per time)   Not on track    Recommend to exercise for 3 days a week for at least 30 minutes at a time.      Increase water intake   On track    Recommend  to continue drinking 6-8 glasses of water a day.        Depression Screen    07/13/2023    3:58 PM 04/02/2023    4:10 PM 10/01/2022    3:58 PM 06/26/2022    2:26 PM 02/14/2022    1:19 PM 10/11/2019    1:21 PM 11/12/2018   10:07 AM  PHQ 2/9 Scores  PHQ - 2 Score 0 0 0 0 0 0 0  PHQ- 9 Score  4 3 5 3       Fall Risk    07/13/2023    3:49 PM 04/02/2023    4:10 PM 10/01/2022    3:57 PM 06/26/2022    2:25 PM 02/14/2022    1:19 PM  Fall Risk   Falls in the past year? 1 1 1 1 1   Number falls in past yr: 1 1 1 1 1   Injury with Fall? 0 0 1 1 0  Risk for fall due to : History of fall(s);Impaired balance/gait;Orthopedic patient  History of fall(s) History of fall(s) No Fall Risks  Follow up Falls prevention discussed;Education provided  Falls evaluation completed      MEDICARE RISK AT HOME:  Medicare Risk at Home - 07/13/23 1553     Any stairs in or around the home? Yes    If so, are there any without handrails? Yes    Home free of loose throw rugs in walkways, pet beds, electrical cords, etc? Yes  Adequate lighting in your home to reduce risk of falls? Yes    Life alert? No    Use of a cane, walker or w/c? Yes    Grab bars in the bathroom? Yes    Shower chair or bench in shower? No    Elevated toilet seat or a handicapped toilet? Yes             TIMED UP AND GO:  Was the test performed?  No    Cognitive Function:        07/13/2023    4:04 PM  6CIT Screen  What Year? 0 points  What month? 0 points  What time? 0 points  Count back from 20 0 points  Months in reverse 0 points  Repeat phrase 0 points  Total Score 0 points    Immunizations Immunization History  Administered Date(s) Administered   Fluad Quad(high Dose 65+) 10/11/2019, 10/01/2022   Influenza, High Dose Seasonal PF 11/01/2015, 09/15/2017   Influenza-Unspecified 09/28/2013, 10/07/2018, 11/05/2021   PFIZER(Purple Top)SARS-COV-2 Vaccination 02/04/2020, 02/28/2020, 10/18/2020   Pneumococcal Conjugate-13  09/15/2017   Pneumococcal Polysaccharide-23 01/07/2013   Pneumococcal-Unspecified 04/28/2018   Zoster, Live 01/07/2013    TDAP status: Up to date  Flu Vaccine status: Up to date  Pneumococcal vaccine status: Up to date  Covid-19 vaccine status: Completed vaccines  Qualifies for Shingles Vaccine? Yes   Zostavax completed No   Shingrix Completed?: No.    Education has been provided regarding the importance of this vaccine. Patient has been advised to call insurance company to determine out of pocket expense if they have not yet received this vaccine. Advised may also receive vaccine at local pharmacy or Health Dept. Verbalized acceptance and understanding.  Screening Tests Health Maintenance  Topic Date Due   DTaP/Tdap/Td (1 - Tdap) Never done   Zoster Vaccines- Shingrix (1 of 2) 08/25/1965   COVID-19 Vaccine (4 - 2023-24 season) 08/29/2022   Hepatitis C Screening  10/02/2023 (Originally 08/26/1964)   INFLUENZA VACCINE  07/30/2023   Medicare Annual Wellness (AWV)  07/12/2024   Pneumonia Vaccine 57+ Years old  Completed   HPV VACCINES  Aged Out   Colonoscopy  Discontinued    Health Maintenance  Health Maintenance Due  Topic Date Due   DTaP/Tdap/Td (1 - Tdap) Never done   Zoster Vaccines- Shingrix (1 of 2) 08/25/1964   COVID-19 Vaccine (4 - 2023-24 season) 08/29/2022    Colorectal cancer screening: No longer required.   Lung Cancer Screening: (Low Dose CT Chest recommended if Age 50-80 years, 20 pack-year currently smoking OR have quit w/in 15years.) does not qualify.   Lung Cancer Screening Referral: no  Additional Screening:  Hepatitis C Screening: does not qualify; Completed yes  Vision Screening: Recommended annual ophthalmology exams for early detection of glaucoma and other disorders of the eye. Is the patient up to date with their annual eye exam?  Yes  Who is the provider or what is the name of the office in which the patient attends annual eye exams? Dr  Nance Pear If pt is not established with a provider, would they like to be referred to a provider to establish care? No .   Dental Screening: Recommended annual dental exams for proper oral hygiene  Diabetic Foot Exam:   Community Resource Referral / Chronic Care Management: CRR required this visit?  No   CCM required this visit?  No    Plan:     I have personally reviewed and noted the following in  the patient's chart:   Medical and social history Use of alcohol, tobacco or illicit drugs  Current medications and supplements including opioid prescriptions. Patient is not currently taking opioid prescriptions. Functional ability and status Nutritional status Physical activity Advanced directives List of other physicians Hospitalizations, surgeries, and ER visits in previous 12 months Vitals Screenings to include cognitive, depression, and falls Referrals and appointments  In addition, I have reviewed and discussed with patient certain preventive protocols, quality metrics, and best practice recommendations. A written personalized care plan for preventive services as well as general preventive health recommendations were provided to patient.    Sue Lush, LPN   [id number]   After Visit Summary: (MyChart) Due to this being a telephonic visit, the after visit summary with patients personalized plan was offered to patient via MyChart   Nurse Notes: The patient states he is doing alright and has no concerns or questions at this time.

## 2023-07-13 NOTE — Patient Instructions (Signed)
Eugene White , Thank you for taking time to come for your Medicare Wellness Visit. I appreciate your ongoing commitment to your health goals. Please review the following plan we discussed and let me know if I can assist you in the future.   These are the goals we discussed:  Goals      Exercise 3x per week (30 min per time)     Recommend to exercise for 3 days a week for at least 30 minutes at a time.      Increase water intake     Recommend to continue drinking 6-8 glasses of water a day.      RNCM: Health and wellness     Care Coordination Interventions: Evaluation of current treatment plan related to procedure the patient had today with dermatologist to remove an area off of his nose and chronic conditions and patient's adherence to plan as established by provider Advised patient to call the [employer] for questions, concerns, new needs. The patient states his wife is a retired Engineer, civil (consulting) and she takes very good care of him. He denies any needs or concerns. Education provided on the goals of the program and to call for any new needs.  Reviewed scheduled/upcoming provider appointments including 09-26-2022 at 220 pm Discussed plans with patient for ongoing care management follow up and provided patient with direct contact information for care management team Advised patient to discuss changes in chronic conditions, questions or concerns with provider Assessed social determinant of health barriers           This is a list of the screening recommended for you and due dates:  Health Maintenance  Topic Date Due   DTaP/Tdap/Td vaccine (1 - Tdap) Never done   Zoster (Shingles) Vaccine (1 of 2) 08/25/1965   COVID-19 Vaccine (4 - 2023-24 season) 08/29/2022   Hepatitis C Screening  10/02/2023*   Flu Shot  07/30/2023   Medicare Annual Wellness Visit  07/12/2024   Pneumonia Vaccine  Completed   HPV Vaccine  Aged Out   Colon Cancer Screening  Discontinued  *Topic was postponed. The date shown is not  the original due date.    Advanced directives: yes  Conditions/risks identified: high falls risk  Next appointment: Follow up in one year for your annual wellness visit. 07/18/2024 @ 3:30pm telephone  Preventive Care 65 Years and Older, Male  Preventive care refers to lifestyle choices and visits with your health care provider that can promote health and wellness. What does preventive care include? A yearly physical exam. This is also called an annual well check. Dental exams once or twice a year. Routine eye exams. Ask your health care provider how often you should have your eyes checked. Personal lifestyle choices, including: Daily care of your teeth and gums. Regular physical activity. Eating a healthy diet. Avoiding tobacco and drug use. Limiting alcohol use. Practicing safe sex. Taking low doses of aspirin every day. Taking vitamin and mineral supplements as recommended by your health care provider. What happens during an annual well check? The services and screenings done by your health care provider during your annual well check will depend on your age, overall health, lifestyle risk factors, and family history of disease. Counseling  Your health care provider may ask you questions about your: Alcohol use. Tobacco use. Drug use. Emotional well-being. Home and relationship well-being. Sexual activity. Eating habits. History of falls. Memory and ability to understand (cognition). Work and work Astronomer. Screening  You may have the following tests  or measurements: Height, weight, and BMI. Blood pressure. Lipid and cholesterol levels. These may be checked every 5 years, or more frequently if you are over 23 years old. Skin check. Lung cancer screening. You may have this screening every year starting at age 78 if you have a 30-pack-year history of smoking and currently smoke or have quit within the past 15 years. Fecal occult blood test (FOBT) of the stool. You may  have this test every year starting at age 78. Flexible sigmoidoscopy or colonoscopy. You may have a sigmoidoscopy every 5 years or a colonoscopy every 10 years starting at age 78. Prostate cancer screening. Recommendations will vary depending on your family history and other risks. Hepatitis C blood test. Hepatitis B blood test. Sexually transmitted disease (STD) testing. Diabetes screening. This is done by checking your blood sugar (glucose) after you have not eaten for a while (fasting). You may have this done every 1-3 years. Abdominal aortic aneurysm (AAA) screening. You may need this if you are a current or former smoker. Osteoporosis. You may be screened starting at age 78 if you are at high risk. Talk with your health care provider about your test results, treatment options, and if necessary, the need for more tests. Vaccines  Your health care provider may recommend certain vaccines, such as: Influenza vaccine. This is recommended every year. Tetanus, diphtheria, and acellular pertussis (Tdap, Td) vaccine. You may need a Td booster every 10 years. Zoster vaccine. You may need this after age 78. Pneumococcal 13-valent conjugate (PCV13) vaccine. One dose is recommended after age 78. Pneumococcal polysaccharide (PPSV23) vaccine. One dose is recommended after age 78. Talk to your health care provider about which screenings and vaccines you need and how often you need them. This information is not intended to replace advice given to you by your health care provider. Make sure you discuss any questions you have with your health care provider. Document Released: 01/11/2016 Document Revised: 09/03/2016 Document Reviewed: 10/16/2015 Elsevier Interactive Patient Education  2017 [employer].  Fall Prevention in the Home Falls can cause injuries. They can happen to people of all ages. There are many things you can do to make your home safe and to help prevent falls. What can I do on the outside  of my home? Regularly fix the edges of walkways and driveways and fix any cracks. Remove anything that might make you trip as you walk through a door, such as a raised step or threshold. Trim any bushes or trees on the path to your home. Use bright outdoor lighting. Clear any walking paths of anything that might make someone trip, such as rocks or tools. Regularly check to see if handrails are loose or broken. Make sure that both sides of any steps have handrails. Any raised decks and porches should have guardrails on the edges. Have any leaves, snow, or ice cleared regularly. Use sand or salt on walking paths during winter. Clean up any spills in your garage right away. This includes oil or grease spills. What can I do in the bathroom? Use night lights. Install grab bars by the toilet and in the tub and shower. Do not use towel bars as grab bars. Use non-skid mats or decals in the tub or shower. If you need to sit down in the shower, use a plastic, non-slip stool. Keep the floor dry. Clean up any water that spills on the floor as soon as it happens. Remove soap buildup in the tub or shower regularly. Attach  bath mats securely with double-sided non-slip rug tape. Do not have throw rugs and other things on the floor that can make you trip. What can I do in the bedroom? Use night lights. Make sure that you have a light by your bed that is easy to reach. Do not use any sheets or blankets that are too big for your bed. They should not hang down onto the floor. Have a firm chair that has side arms. You can use this for support while you get dressed. Do not have throw rugs and other things on the floor that can make you trip. What can I do in the kitchen? Clean up any spills right away. Avoid walking on wet floors. Keep items that you use a lot in easy-to-reach places. If you need to reach something above you, use a strong step stool that has a grab bar. Keep electrical cords out of the  way. Do not use floor polish or wax that makes floors slippery. If you must use wax, use non-skid floor wax. Do not have throw rugs and other things on the floor that can make you trip. What can I do with my stairs? Do not leave any items on the stairs. Make sure that there are handrails on both sides of the stairs and use them. Fix handrails that are broken or loose. Make sure that handrails are as long as the stairways. Check any carpeting to make sure that it is firmly attached to the stairs. Fix any carpet that is loose or worn. Avoid having throw rugs at the top or bottom of the stairs. If you do have throw rugs, attach them to the floor with carpet tape. Make sure that you have a light switch at the top of the stairs and the bottom of the stairs. If you do not have them, ask someone to add them for you. What else can I do to help prevent falls? Wear shoes that: Do not have high heels. Have rubber bottoms. Are comfortable and fit you well. Are closed at the toe. Do not wear sandals. If you use a stepladder: Make sure that it is fully opened. Do not climb a closed stepladder. Make sure that both sides of the stepladder are locked into place. Ask someone to hold it for you, if possible. Clearly mark and make sure that you can see: Any grab bars or handrails. First and last steps. Where the edge of each step is. Use tools that help you move around (mobility aids) if they are needed. These include: Canes. Walkers. Scooters. Crutches. Turn on the lights when you go into a dark area. Replace any light bulbs as soon as they burn out. Set up your furniture so you have a clear path. Avoid moving your furniture around. If any of your floors are uneven, fix them. If there are any pets around you, be aware of where they are. Review your medicines with your doctor. Some medicines can make you feel dizzy. This can increase your chance of falling. Ask your doctor what other things that you can  do to help prevent falls. This information is not intended to replace advice given to you by your health care provider. Make sure you discuss any questions you have with your health care provider. Document Released: 10/11/2009 Document Revised: 05/22/2016 Document Reviewed: 01/19/2015 Elsevier Interactive Patient Education  2017 [employer].

## 2023-08-04 ENCOUNTER — Other Ambulatory Visit: Payer: Self-pay

## 2023-08-04 DIAGNOSIS — I480 Paroxysmal atrial fibrillation: Secondary | ICD-10-CM

## 2023-08-04 MED ORDER — METOPROLOL TARTRATE 25 MG PO TABS
25.0000 mg | ORAL_TABLET | Freq: Two times a day (BID) | ORAL | 2 refills | Status: DC
Start: 2023-08-04 — End: 2024-06-24

## 2023-08-24 DIAGNOSIS — H2513 Age-related nuclear cataract, bilateral: Secondary | ICD-10-CM | POA: Diagnosis not present

## 2023-08-24 DIAGNOSIS — H353131 Nonexudative age-related macular degeneration, bilateral, early dry stage: Secondary | ICD-10-CM | POA: Diagnosis not present

## 2023-09-02 ENCOUNTER — Other Ambulatory Visit: Payer: Self-pay

## 2023-09-02 ENCOUNTER — Other Ambulatory Visit: Payer: Self-pay | Admitting: *Deleted

## 2023-09-02 DIAGNOSIS — I071 Rheumatic tricuspid insufficiency: Secondary | ICD-10-CM

## 2023-09-02 DIAGNOSIS — Z79899 Other long term (current) drug therapy: Secondary | ICD-10-CM

## 2023-09-02 DIAGNOSIS — I340 Nonrheumatic mitral (valve) insufficiency: Secondary | ICD-10-CM

## 2023-09-02 MED ORDER — POTASSIUM CHLORIDE CRYS ER 20 MEQ PO TBCR: 40.0000 meq | EXTENDED_RELEASE_TABLET | Freq: Every day | ORAL | 2 refills | Status: DC

## 2023-09-02 MED ORDER — MAGNESIUM OXIDE -MG SUPPLEMENT 400 (240 MG) MG PO TABS
ORAL_TABLET | ORAL | 2 refills | Status: DC
Start: 2023-09-02 — End: 2024-01-05

## 2023-09-03 ENCOUNTER — Ambulatory Visit: Payer: Self-pay | Admitting: *Deleted

## 2023-09-03 NOTE — Telephone Encounter (Signed)
  Chief Complaint: Larey Seat a week and a 1/2 ago.   Laceration over right eye that did not require sutures, hematoma at waist, possible broken rib, On Eliquis. Symptoms: In pain with deep breaths and where he has the hematoma at his waist. Frequency: Fell on Monday a week and a half ago.  Pertinent Negatives: Patient denies tripping or being dizzy.   He just fell and hit on the floor.   Did not hit anything on the way down. Disposition: [] ED /[] Urgent Care (no appt availability in office) / [x] Appointment(In office/virtual)/ []  [facility]/ [] Home Care/ [] Refused Recommended Disposition /[] [facility]/ []  Follow-up with PCP Additional Notes: Appt made with Dr. Payton Mccallum for 09/04/2023 at 2:40.

## 2023-09-03 NOTE — Telephone Encounter (Signed)
Reason for Disposition  [1] MODERATE weakness (i.e., interferes with work, school, normal activities) AND [2] new-onset or worsening    He fell last Monday.  Answer Assessment - Initial Assessment Questions 1. MECHANISM: "How did the fall happen?"     Wife calling in.    He fell last week.   He fell last week.   He did not trip.   He just fell down.   He has a laceration on head that wants to bleed.   He thinks he has a broken rib.   I know nothing can be done.   He has a large hematoma around his waist on the right side.    We are put heat on it.    He is on blood thinners.   Eliquis.    He picked the scab off during the night on his head.    It did not need sutures.   It's an abrasion over his right eye.      2. DOMESTIC VIOLENCE AND ELDER ABUSE SCREENING: "Did you fall because someone pushed you or tried to hurt you?" If Yes, ask: "Are you safe now?"     No 3. ONSET: "When did the fall happen?" (e.g., minutes, hours, or days ago)    He fell on Monday a week and 1/2 ago.   We were at the beach.    Our daughter is a Careers adviser and has been monitoring it.    Abrasion over right eye.   Possible broken rib.   Hematoma on right side at waist.   He hurts when he takes a deep breath. 4. LOCATION: "What part of the body hit the ground?" (e.g., back, buttocks, head, hips, knees, hands, head, stomach)     Head over right eye and right  side.      He hit the floor. 5. INJURY: "Did you hurt (injure) yourself when you fell?" If Yes, ask: "What did you injure? Tell me more about this?" (e.g., body area; type of injury; pain severity)"     See above 6. PAIN: "Is there any pain?" If Yes, ask: "How bad is the pain?" (e.g., Scale 1-10; or mild,  moderate, severe)   - NONE (0): No pain   - MILD (1-3): Doesn't interfere with normal activities    - MODERATE (4-7): Interferes with normal activities or awakens from sleep    - SEVERE (8-10): Excruciating pain, unable to do any normal activities      Yes 7. SIZE: For  cuts, bruises, or swelling, ask: "How large is it?" (e.g., inches or centimeters)      Laceration over right eye that did not need sutures. 8. PREGNANCY: "Is there any chance you are pregnant?" "When was your last menstrual period?"     N/A 9. OTHER SYMPTOMS: "Do you have any other symptoms?" (e.g., dizziness, fever, weakness; new onset or worsening).      No headaches or dizziness 10. CAUSE: "What do you think caused the fall (or falling)?" (e.g., tripped, dizzy spell)       He just fell.  Protocols used: Falls and [facility]

## 2023-09-04 ENCOUNTER — Ambulatory Visit (INDEPENDENT_AMBULATORY_CARE_PROVIDER_SITE_OTHER): Payer: Medicare HMO | Admitting: Family Medicine

## 2023-09-04 ENCOUNTER — Encounter: Payer: Self-pay | Admitting: Family Medicine

## 2023-09-04 ENCOUNTER — Telehealth: Payer: Self-pay

## 2023-09-04 VITALS — BP 116/83 | HR 86 | Temp 98.6°F | Ht 71.0 in | Wt 175.0 lb

## 2023-09-04 DIAGNOSIS — Z7901 Long term (current) use of anticoagulants: Secondary | ICD-10-CM

## 2023-09-04 DIAGNOSIS — S301XXA Contusion of abdominal wall, initial encounter: Secondary | ICD-10-CM

## 2023-09-04 DIAGNOSIS — E039 Hypothyroidism, unspecified: Secondary | ICD-10-CM

## 2023-09-04 DIAGNOSIS — E559 Vitamin D deficiency, unspecified: Secondary | ICD-10-CM

## 2023-09-04 DIAGNOSIS — W1830XA Fall on same level, unspecified, initial encounter: Secondary | ICD-10-CM | POA: Diagnosis not present

## 2023-09-04 NOTE — Progress Notes (Unsigned)
Established patient visit   Patient: Eugene White   DOB: May 13, 1946   77 y.o. Male  MRN: [id number] Visit Date: 09/04/2023  Today's healthcare provider: Sherlyn Hay, DO   Chief Complaint  Patient presents with  . Fall    Patient fell on 08/24/23.  States he had eyes dilated at eye exam earlier that day.  He was still somewhat disoriented from the dilation and when he when to walk his dog moved in his way and he ended up stumbling over the dog.  He states he landed on his right knee and side.  He has significant bruising, and hematoma of the right rib area.  Patient is on Eliquis.  He did not seek medical treatment at the time or since until today as he was with his daughter who is an MD and wife who is a retired Charity fundraiser.   Subjective    Fall Pertinent negatives include no abdominal pain, fever, nausea or vomiting.   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 Also hit head and left knee Soreness to lateral right ribs  Bruising to right shoulder  - still taking eliquis since the fall   Larey Seat a couple years ago and had bleeding involving the kidney.  Had eyes checked a week ago Hx cardioverted 3 times  Has doctorate in physiology.   ***  {History (Optional):23778}  Medications: Outpatient Medications Prior to Visit  Medication Sig  . amoxicillin (AMOXIL) 500 MG  capsule Take 4 capsules by mouth as directed. Before dental appointment  . apixaban (ELIQUIS) 5 MG TABS tablet Take 1 tablet (5 mg total) by mouth 2 (two) times daily.  Marland Kitchen atorvastatin (LIPITOR) 20 MG tablet TAKE 1 TABLET (20 MG TOTAL) BY MOUTH DAILY.  Marland Kitchen Cholecalciferol (VITAMIN D) 2000 UNITS CAPS Take 2,000 Units by mouth every evening.   . diphenhydramine-acetaminophen (TYLENOL PM) 25-500 MG TABS tablet Take 4 tablets by mouth at bedtime as needed (sleep).  Marland Kitchen ibuprofen (ADVIL) 200 MG tablet Take 800 mg by mouth daily as needed for moderate pain.  Marland Kitchen levothyroxine (SYNTHROID) 100 MCG tablet TAKE 1 TABLET (100 MCG TOTAL) BY MOUTH DAILY.  Marland Kitchen losartan (COZAAR) 25 MG tablet Take 1 tablet (25 mg total) by mouth daily.  . magnesium oxide (MAG-OX) 400 (240 Mg) MG tablet TAKE 1 TABLET TWICE DAILY .  Marland Kitchen Menthol, Topical Analgesic, (BLUE-EMU MAXIMUM STRENGTH EX) Apply 1 application  topically at bedtime as needed (pain).  . metoprolol tartrate (LOPRESSOR) 25 MG tablet Take 1 tablet (25 mg total) by mouth 2 (two) times daily.  . Multiple Vitamin (MULTIVITAMIN WITH MINERALS) TABS tablet Take 1 tablet by mouth daily.   . Multiple Vitamins-Minerals (PRESERVISION AREDS 2) CAPS Take 1 capsule by mouth 2 (two) times daily.   . pantoprazole (PROTONIX) 40 MG tablet TAKE 1 TABLET EVERY DAY  . potassium chloride SA (KLOR-CON M20) 20  MEQ tablet Take 2 tablets (40 mEq total) by mouth daily.  Marland Kitchen venlafaxine XR (EFFEXOR-XR) 75 MG 24 hr capsule TAKE 1 CAPSULE EVERY DAY   No facility-administered medications prior to visit.    Review of Systems  Constitutional:  Negative for appetite change, chills and fever.  Eyes:  Negative for visual disturbance.  Respiratory:  Negative for chest tightness, shortness of breath and wheezing.   Cardiovascular:  Negative for chest pain and palpitations.  Gastrointestinal:  Negative for abdominal pain, nausea and vomiting.  Neurological:  Negative for dizziness.    {Insert previous  labs (optional):23779} {See past labs  Heme  Chem  Endocrine  Serology  Results Review (optional):1}   Objective    BP 116/83 (BP Location: Right Arm, Patient Position: Sitting, Cuff Size: Normal)   Pulse 86   Temp 98.6 F (37 C) (Oral)   Ht 5\' 11"  (1.803 m)   Wt 175 lb (79.4 kg)   SpO2 100%   BMI 24.41 kg/m  {Insert last BP/Wt (optional):23777}{See vitals history (optional):1}   Physical Exam Vitals reviewed.  Constitutional:      General: He is not in acute distress.    Appearance: Normal appearance. He is not diaphoretic.  HENT:     Head: Normocephalic and atraumatic.  Eyes:     General: No scleral icterus.    Conjunctiva/sclera: Conjunctivae normal.  Cardiovascular:     Rate and Rhythm: Normal rate.     Heart sounds: Normal heart sounds. No murmur heard. Pulmonary:     Effort: Pulmonary effort is normal. No respiratory distress.     Breath sounds: Normal breath sounds. No wheezing or rhonchi.  Musculoskeletal:     Cervical back: Neck supple.     Right lower leg: No edema.     Left lower leg: No edema.  Skin:    General: Skin is warm and dry.     Findings: No rash.     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 Mental status is at baseline.  Psychiatric:        Mood and Affect: Mood normal.        Behavior: Behavior normal.     No results found for any visits on 09/04/23.  Assessment & Plan    There are no diagnoses linked to this encounter.   ***  No follow-ups on file.      I discussed the assessment and treatment plan with the patient  The patient was provided an opportunity to ask questions and all were answered. The patient agreed with the plan and demonstrated an understanding of the instructions.   The patient was advised to call back or seek an in-person evaluation if the symptoms worsen or if the condition fails to improve as  anticipated.    Sherlyn Hay, DO  [facility] [phone] (phone) [phone] (fax)  [facility]

## 2023-09-04 NOTE — Telephone Encounter (Signed)
Call from imaging - they were unsure where pt should go for testing. Pt came on the line. He states that he is not going to have any head ct or abdominal ct. This was for a fall 12 days ago, where at most he has a cracked rib. Pt would have consented to an xray, but now will not go to ed for that either.  Pt was Very upset that these tests were ordered. Pt states that his daughter, a Careers adviser examined him and does not feel that these tests are appropriate. Pt states he will wait and see how he feels next week.

## 2023-09-04 NOTE — Telephone Encounter (Unsigned)
Copied from CRM [id number]. Topic: General - Other >> Sep 04, 2023  4:52 PM Turkey B wrote: Reason for CRM: pt's wife called in about pt getting ct scan done , but hasn't been released yet, I let her know she will be contacted back, of when to go for pt to have this done.

## 2023-09-05 LAB — CBC WITH DIFFERENTIAL/PLATELET
Basophils Absolute: 0 10*3/uL (ref 0.0–0.2)
Basos: 0 %
EOS (ABSOLUTE): 0 10*3/uL (ref 0.0–0.4)
Eos: 0 %
Hematocrit: 34.2 % — ABNORMAL LOW (ref 37.5–51.0)
Hemoglobin: 11.8 g/dL — ABNORMAL LOW (ref 13.0–17.7)
Immature Grans (Abs): 0 10*3/uL (ref 0.0–0.1)
Immature Granulocytes: 0 %
Lymphocytes Absolute: 1 10*3/uL (ref 0.7–3.1)
Lymphs: 14 %
MCH: 35.4 pg — ABNORMAL HIGH (ref 26.6–33.0)
MCHC: 34.5 g/dL (ref 31.5–35.7)
MCV: 103 fL — ABNORMAL HIGH (ref 79–97)
Monocytes Absolute: 0.8 10*3/uL (ref 0.1–0.9)
Monocytes: 11 %
Neutrophils Absolute: 5.2 10*3/uL (ref 1.4–7.0)
Neutrophils: 75 %
Platelets: 331 10*3/uL (ref 150–450)
RBC: 3.33 x10E6/uL — ABNORMAL LOW (ref 4.14–5.80)
RDW: 12.7 % (ref 11.6–15.4)
WBC: 7 10*3/uL (ref 3.4–10.8)

## 2023-09-05 LAB — COMPREHENSIVE METABOLIC PANEL
ALT: 16 IU/L (ref 0–44)
AST: 25 IU/L (ref 0–40)
Albumin: 4.6 g/dL (ref 3.8–4.8)
Alkaline Phosphatase: 117 IU/L (ref 44–121)
BUN/Creatinine Ratio: 17 (ref 10–24)
BUN: 13 mg/dL (ref 8–27)
Bilirubin Total: 1.2 mg/dL (ref 0.0–1.2)
CO2: 23 mmol/L (ref 20–29)
Calcium: 9.7 mg/dL (ref 8.6–10.2)
Chloride: 99 mmol/L (ref 96–106)
Creatinine, Ser: 0.75 mg/dL — ABNORMAL LOW (ref 0.76–1.27)
Globulin, Total: 2.3 g/dL (ref 1.5–4.5)
Glucose: 103 mg/dL — ABNORMAL HIGH (ref 70–99)
Potassium: 4.9 mmol/L (ref 3.5–5.2)
Sodium: 138 mmol/L (ref 134–144)
Total Protein: 6.9 g/dL (ref 6.0–8.5)
eGFR: 92 mL/min/{1.73_m2} (ref >59)

## 2023-09-05 LAB — VITAMIN D 25 HYDROXY (VIT D DEFICIENCY, FRACTURES): Vit D, 25-Hydroxy: 54.9 ng/mL (ref 30.0–100.0)

## 2023-09-05 LAB — TSH: TSH: 1.79 u[IU]/mL (ref 0.450–4.500)

## 2023-09-07 DIAGNOSIS — Z7901 Long term (current) use of anticoagulants: Secondary | ICD-10-CM | POA: Insufficient documentation

## 2023-09-07 DIAGNOSIS — W1830XA Fall on same level, unspecified, initial encounter: Secondary | ICD-10-CM | POA: Insufficient documentation

## 2023-09-07 DIAGNOSIS — S301XXA Contusion of abdominal wall, initial encounter: Secondary | ICD-10-CM | POA: Insufficient documentation

## 2023-09-07 DIAGNOSIS — E559 Vitamin D deficiency, unspecified: Secondary | ICD-10-CM | POA: Insufficient documentation

## 2023-09-07 NOTE — Assessment & Plan Note (Signed)
I discussed with the patient that, given the extremely large size of the hematoma, he would likely need surgical intervention.  Patient refused referral at this time.

## 2023-09-07 NOTE — Assessment & Plan Note (Signed)
On Eliquis 5 mg twice daily. Patient stated that his daughter was concerned about him obtaining a vitamin D and vitamin B12 level to evaluate his current status. I explained to them that I did not have a reason to order a B12 level, as his bleeding is readily explained by his continued anticoagulation with Eliquis.

## 2023-09-07 NOTE — Assessment & Plan Note (Signed)
Patient stated that his daughter was concerned about him obtaining a vitamin D and vitamin B12 level to evaluate his current status.  Given his history of vitamin D deficiency I did go ahead and order this as noted below.  However, I did explain to them that I did not have a reason to order a B12 level, as his bleeding is readily explained by his continued anticoagulation with Eliquis.

## 2023-09-07 NOTE — Assessment & Plan Note (Addendum)
Ordered CT abdomen/pelvis with contrast and CT head without contrast as noted to evaluate for continued bleeding as patient fell and hit several areas of his body, leading to a very large right abdominal/flank hematoma, as well as a laceration to his right eyebrow laterally which continues to ooze.    - Unfortunately, given the timing of the appointment, a prior authorization was not able to be obtained in office for him to have this done today.    - I strongly encouraged the patient and his wife to go to the emergency room to have his imaging done, as it is highly likely he is continuing to bleed intra-abdominally and the possibility of a slow intracranial bleed exists.    - Patient was adamant that he would know if he were continuing to bleed, insisting that he would feel anemic as he had in the past and refused to go to the ER.  - I subsequently ordered a chest x-ray to at least evaluate for the presence/absence of rib fracture, though I continued to encourage them to go to the ER to get the other exams.

## 2023-09-07 NOTE — Assessment & Plan Note (Signed)
Patient is overdue to have his TSH checked.  Since we are obtaining blood today I went ahead and ordered this as noted below.

## 2023-09-11 ENCOUNTER — Telehealth: Payer: Self-pay

## 2023-09-11 NOTE — Telephone Encounter (Signed)
Pt's wife given lab results per notes of Dr. Payton Mccallum on 09/07/23. Pt's wife verbalized understanding. Mrs. Klauser stated that they did try to have the x-ray done but the imaging office stated that they did not have an order on file for it. Mrs. Peirson also stated that they are not interested in do the Head CT scan and do not want a referral for the surgeon at this time. She stated the patient is doing fine and not having symptoms of a concussion so she does not see the need to do anything further. Advised if they change their mind or have additional questions to call the office. Both the patient and wife verbalized understanding. Hello Okley,   Your labs results are back.  Your metabolic panel (kidney function, liver function and electrolytes) is normal/stable overall.  Your complete blood count shows anemia with a hemoglobin of 11.8, down from a recent average of 14.  Your vitamin D and thyroid level are within normal limits.   Let us know if you have any questions or concerns regarding these.  As you are aware, I do recommend you go ahead and get the imaging studies done (CT and x-ray).  Whether you do this or not, I do still recommend you be seen by a surgeon to address the hematoma, given how large it is.  Please let me know if you are okay with me placing this referral.   Take care, Dr. Payton Mccallum  Written by Sherlyn Hay, DO on 09/07/2023 10:02 PM EDT

## 2023-10-28 ENCOUNTER — Other Ambulatory Visit: Payer: Self-pay

## 2023-10-28 ENCOUNTER — Encounter: Payer: Self-pay | Admitting: Intensive Care

## 2023-10-28 ENCOUNTER — Emergency Department
Admission: EM | Admit: 2023-10-28 | Discharge: 2023-10-28 | Disposition: A | Discharge status: Home / Self Care | Payer: Medicare HMO | Attending: Emergency Medicine | Admitting: Emergency Medicine

## 2023-10-28 ENCOUNTER — Emergency Department: Payer: Medicare HMO

## 2023-10-28 DIAGNOSIS — S199XXA Unspecified injury of neck, initial encounter: Secondary | ICD-10-CM | POA: Diagnosis not present

## 2023-10-28 DIAGNOSIS — I10 Essential (primary) hypertension: Secondary | ICD-10-CM | POA: Insufficient documentation

## 2023-10-28 DIAGNOSIS — Z7901 Long term (current) use of anticoagulants: Secondary | ICD-10-CM | POA: Diagnosis not present

## 2023-10-28 DIAGNOSIS — S0990XA Unspecified injury of head, initial encounter: Secondary | ICD-10-CM

## 2023-10-28 DIAGNOSIS — W01198A Fall on same level from slipping, tripping and stumbling with subsequent striking against other object, initial encounter: Secondary | ICD-10-CM | POA: Diagnosis not present

## 2023-10-28 DIAGNOSIS — S0083XA Contusion of other part of head, initial encounter: Secondary | ICD-10-CM

## 2023-10-28 DIAGNOSIS — W19XXXA Unspecified fall, initial encounter: Secondary | ICD-10-CM

## 2023-10-28 DIAGNOSIS — Z23 Encounter for immunization: Secondary | ICD-10-CM | POA: Diagnosis not present

## 2023-10-28 DIAGNOSIS — S02609A Fracture of mandible, unspecified, initial encounter for closed fracture: Secondary | ICD-10-CM | POA: Diagnosis not present

## 2023-10-28 DIAGNOSIS — S0181XA Laceration without foreign body of other part of head, initial encounter: Secondary | ICD-10-CM

## 2023-10-28 DIAGNOSIS — S0993XA Unspecified injury of face, initial encounter: Secondary | ICD-10-CM | POA: Diagnosis present

## 2023-10-28 MED ORDER — OXYCODONE HCL 5 MG PO TABS: 5.0000 mg | ORAL_TABLET | Freq: Three times a day (TID) | ORAL | 0 refills | Status: DC | PRN

## 2023-10-28 MED ORDER — LIDOCAINE-EPINEPHRINE (PF) 2 %-1:200000 IJ SOLN
20.0000 mL | Freq: Once | INTRAMUSCULAR | Status: AC
  Administered 2023-10-28: 20 mL via INTRADERMAL
  Filled 2023-10-28: qty 20

## 2023-10-28 MED ORDER — LIDOCAINE-EPINEPHRINE-TETRACAINE (LET) TOPICAL GEL
6.0000 mL | Freq: Once | TOPICAL | Status: AC
  Administered 2023-10-28: 6 mL via TOPICAL
  Filled 2023-10-28: qty 6

## 2023-10-28 MED ORDER — TETANUS-DIPHTH-ACELL PERTUSSIS 5-2.5-18.5 LF-MCG/0.5 IM SUSY
0.5000 mL | PREFILLED_SYRINGE | Freq: Once | INTRAMUSCULAR | Status: AC
  Administered 2023-10-28: 0.5 mL via INTRAMUSCULAR
  Filled 2023-10-28: qty 0.5

## 2023-10-28 MED ORDER — OXYCODONE-ACETAMINOPHEN 5-325 MG PO TABS
1.0000 | ORAL_TABLET | Freq: Once | ORAL | Status: AC
  Administered 2023-10-28: 1 via ORAL
  Filled 2023-10-28: qty 1

## 2023-10-28 MED ORDER — CEPHALEXIN 500 MG PO CAPS: 500.0000 mg | ORAL_CAPSULE | Freq: Three times a day (TID) | ORAL | 0 refills | Status: AC

## 2023-10-28 NOTE — ED Provider Notes (Signed)
[facility] Provider Note    Event Date/Time   First MD Initiated Contact with Patient 10/28/23 1713     (approximate)   History   Fall   HPI  Eugene White is a 77 y.o. male with history of hypertension, atrial fibs, GERD and other chronic medical problems presents emergency department after a fall.  [place] he [place] and fell over a sign, hit his face on the edge of the metal sign causing a large laceration.  Unsure of his last Tdap.  No LOC.  No neck pain.  Patient is on Eliquis.  States having a lot of bleeding from the area.      Physical Exam   Triage Vital Signs: ED Triage Vitals  Encounter Vitals Group     BP 10/28/23 1617 (!) 165/95     Systolic BP Percentile --      Diastolic BP Percentile --      Pulse Rate 10/28/23 1617 65     Resp 10/28/23 1617 18     Temp 10/28/23 1617 97.9 F (36.6 C)     Temp Source 10/28/23 1617 Oral     SpO2 10/28/23 1617 96 %     Weight 10/28/23 1618 170 lb (77.1 kg)     Height 10/28/23 1618 5\' 11"  (1.803 m)     Head Circumference --      Peak Flow --      Pain Score 10/28/23 1617 6     Pain Loc --      Pain Education --      Exclude from Growth Chart --     Most recent vital signs: Vitals:   10/28/23 1617 10/28/23 1941  BP: (!) 165/95 (!) 174/88  Pulse: 65 65  Resp: 18 19  Temp: 97.9 F (36.6 C) (!) 97.4 F (36.3 C)  SpO2: 96% 99%     General: Awake, no distress.   CV:  Good peripheral perfusion. regular rate and  rhythm Resp:  Normal effort.  Abd:  No distention.   Other:  Cranial nerves II through XII grossly intact, C-spine nontender, large laceration noted on the left side of face that runs from mid cheek to corner of the mouth, is not through and through, no foreign body noted   ED Results / Procedures / Treatments   Labs (all labs ordered are listed, but only abnormal results are displayed) Labs Reviewed - No data to  display   EKG     RADIOLOGY CT of the head, maxillofacial, C-spine    PROCEDURES:   .Marland KitchenLaceration Repair  Date/Time: 10/28/2023 7:39 PM  Performed by: Faythe Ghee, PA-C Authorized by: Faythe Ghee, PA-C   Consent:    Consent obtained:  Verbal   Consent given by:  Patient   Risks, benefits, and alternatives were discussed: yes     Risks discussed:  Infection, pain, retained foreign body, tendon damage, poor cosmetic result, need for additional repair, nerve damage, poor wound healing and vascular damage   Alternatives discussed:  Delayed treatment Universal protocol:    Procedure explained and questions answered to patient or proxy's satisfaction: yes     Immediately prior to procedure, a time out was called: yes     Patient identity confirmed:  Verbally with patient Anesthesia:    Anesthesia method:  Topical application and local infiltration   Topical anesthetic:  LET   Local anesthetic:  Lidocaine 2% WITH epi Laceration details:    Location:  Face   Face location:  L cheek   Length (cm):  10 Pre-procedure details:    Preparation:  Patient was prepped and draped in usual sterile fashion and imaging obtained to evaluate for foreign bodies Exploration:    Hemostasis achieved with:  LET and direct pressure   Imaging outcome: foreign body not noted     Wound exploration: wound explored through full range of motion and entire depth of wound visualized     Wound extent: areolar tissue not violated, fascia not violated, no foreign body, no signs of injury, no nerve damage, no tendon damage, no underlying fracture and no vascular damage     Contaminated: no   Treatment:    Area cleansed with:  Povidone-iodine and saline   Amount of cleaning:  Standard   Irrigation solution:  Sterile saline   Irrigation method:  Syringe and tap   Layers/structures repaired:  Deep subcutaneous Deep subcutaneous:    Suture size:  5-0   Suture material:  Vicryl   Suture technique:   Vertical mattress and running   Number of sutures:  2 Skin repair:    Repair method:  Sutures   Suture size:  5-0   Suture technique:  Simple interrupted   Number of sutures:  12 Approximation:    Approximation:  Close Repair type:    Repair type:  Intermediate Post-procedure details:    Dressing:  Bulky dressing   Procedure completion:  Tolerated well, no immediate complications Comments:     Due to the large hematoma nursing staff is to apply a pressure dressing, apply ice    MEDICATIONS ORDERED IN ED: Medications  Tdap (BOOSTRIX) injection 0.5 mL (has no administration in time range)  lidocaine-EPINEPHrine-tetracaine (LET) topical gel (6 mLs Topical Given 10/28/23 1716)  lidocaine-EPINEPHrine (XYLOCAINE W/EPI) 2 %-1:200000 (PF) injection 20 mL (20 mLs Intradermal Given by Other 10/28/23 1812)     IMPRESSION / MDM / ASSESSMENT AND PLAN / ED COURSE  I reviewed the triage vital signs and the nursing notes.                              Differential diagnosis includes, but is not limited to, subdural, SAH, C-spine fracture, facial fracture, laceration  Patient's presentation is most consistent with acute presentation with potential threat to life or bodily function.   CT of the head, C-spine, maxillofacial, did independently review and interpret radiologist readings being negative for any acute abnormality  See procedure note for laceration repair  Due to the large hematoma had nursing staff apply a pressure bandage, was able to stop most of the bleeding with lidocaine with epi.  Due to the extent of the laceration and the hematoma [facility] for the patient to either follow-up with his regular doctor or ENT in 2 days for a wound recheck.  Tdap was updated here in the ED.  Also due to the extensive wound along with help from an aide I feel that he should be on antibiotic due to possible contamination of the wound.  He was placed on Keflex 500 3 times daily for 7 days.   Oxycodone for pain not controlled by Tylenol.  Patient is in agreement treatment plan.  He was discharged in stable condition.      FINAL CLINICAL IMPRESSION(S) / ED DIAGNOSES   Final diagnoses:  Fall, initial encounter  Contusion of face,  initial encounter  Facial laceration, initial encounter  Minor head injury, initial encounter     Rx / DC Orders   ED Discharge Orders          Ordered    cephALEXin (KEFLEX) 500 MG capsule  3 times daily        10/28/23 1924    oxyCODONE (ROXICODONE) 5 MG immediate release tablet  Every 8 hours PRN        10/28/23 1924             Note:  This document was prepared using Dragon voice recognition software and may include unintentional dictation errors.    Faythe Ghee, PA-C 10/28/23 1944    Concha Se, MD 10/29/23 [phone]

## 2023-10-28 NOTE — ED Notes (Signed)
See triage notes. Patient stated he hit a "irregularity" in the sidewalk and fell, cutting the left side of his face in the process

## 2023-10-28 NOTE — Discharge Instructions (Addendum)
Stop your Eliquis for 5 days Follow-up with ENT or your regular doctor in 2 days for wound recheck Continue to apply ice to the left side of the face to decrease the amount of swelling Return the emergency department worsening Take the antibiotic as prescribed Oxycodone for pain if pain is not controlled by Tylenol

## 2023-10-28 NOTE — ED Triage Notes (Signed)
Patient arrived by POV after fall and face planted on ground. Patient reports tripping over curb. Laceration present to left cheek. Denies LOC.

## 2023-10-30 DIAGNOSIS — S01412A Laceration without foreign body of left cheek and temporomandibular area, initial encounter: Secondary | ICD-10-CM | POA: Diagnosis not present

## 2023-10-30 DIAGNOSIS — L7622 Postprocedural hemorrhage and hematoma of skin and subcutaneous tissue following other procedure: Secondary | ICD-10-CM | POA: Diagnosis not present

## 2023-11-06 DIAGNOSIS — L7622 Postprocedural hemorrhage and hematoma of skin and subcutaneous tissue following other procedure: Secondary | ICD-10-CM | POA: Diagnosis not present

## 2023-11-06 DIAGNOSIS — S01412A Laceration without foreign body of left cheek and temporomandibular area, initial encounter: Secondary | ICD-10-CM | POA: Diagnosis not present

## 2023-11-09 ENCOUNTER — Other Ambulatory Visit: Payer: Self-pay | Admitting: Physician Assistant

## 2023-11-09 DIAGNOSIS — I10 Essential (primary) hypertension: Secondary | ICD-10-CM

## 2023-11-11 DIAGNOSIS — T07XXXA Unspecified multiple injuries, initial encounter: Secondary | ICD-10-CM | POA: Diagnosis not present

## 2023-11-12 ENCOUNTER — Ambulatory Visit: Payer: Medicare HMO | Admitting: Physician Assistant

## 2023-11-16 ENCOUNTER — Other Ambulatory Visit: Payer: Self-pay | Admitting: Physician Assistant

## 2023-11-19 ENCOUNTER — Telehealth: Payer: Self-pay | Admitting: Family Medicine

## 2023-12-04 DIAGNOSIS — S01412A Laceration without foreign body of left cheek and temporomandibular area, initial encounter: Secondary | ICD-10-CM | POA: Diagnosis not present

## 2024-01-05 ENCOUNTER — Other Ambulatory Visit: Payer: Self-pay | Admitting: Physician Assistant

## 2024-01-05 ENCOUNTER — Other Ambulatory Visit: Payer: Self-pay

## 2024-01-05 DIAGNOSIS — F325 Major depressive disorder, single episode, in full remission: Secondary | ICD-10-CM

## 2024-01-05 DIAGNOSIS — Z79899 Other long term (current) drug therapy: Secondary | ICD-10-CM

## 2024-01-05 DIAGNOSIS — E039 Hypothyroidism, unspecified: Secondary | ICD-10-CM

## 2024-01-05 DIAGNOSIS — N1330 Unspecified hydronephrosis: Secondary | ICD-10-CM

## 2024-01-05 DIAGNOSIS — I340 Nonrheumatic mitral (valve) insufficiency: Secondary | ICD-10-CM

## 2024-01-05 DIAGNOSIS — K219 Gastro-esophageal reflux disease without esophagitis: Secondary | ICD-10-CM

## 2024-01-05 DIAGNOSIS — I10 Essential (primary) hypertension: Secondary | ICD-10-CM

## 2024-01-05 DIAGNOSIS — R319 Hematuria, unspecified: Secondary | ICD-10-CM

## 2024-01-05 DIAGNOSIS — I4891 Unspecified atrial fibrillation: Secondary | ICD-10-CM

## 2024-01-05 DIAGNOSIS — I071 Rheumatic tricuspid insufficiency: Secondary | ICD-10-CM

## 2024-01-05 MED ORDER — LOSARTAN POTASSIUM 25 MG PO TABS: 25.0000 mg | ORAL_TABLET | Freq: Every day | ORAL | 3 refills | Status: DC

## 2024-01-05 MED ORDER — APIXABAN 5 MG PO TABS: 5.0000 mg | ORAL_TABLET | Freq: Two times a day (BID) | ORAL | 1 refills | Status: DC

## 2024-01-05 MED ORDER — PANTOPRAZOLE SODIUM 40 MG PO TBEC: 40.0000 mg | DELAYED_RELEASE_TABLET | Freq: Every day | ORAL | 3 refills | Status: DC

## 2024-01-05 MED ORDER — ATORVASTATIN CALCIUM 20 MG PO TABS: 20.0000 mg | ORAL_TABLET | Freq: Every day | ORAL | 0 refills | Status: DC

## 2024-01-05 MED ORDER — VENLAFAXINE HCL ER 75 MG PO CP24: ORAL_CAPSULE | ORAL | 3 refills | Status: DC

## 2024-01-05 MED ORDER — POTASSIUM CHLORIDE CRYS ER 20 MEQ PO TBCR: 40.0000 meq | EXTENDED_RELEASE_TABLET | Freq: Every day | ORAL | 1 refills | Status: DC

## 2024-01-05 MED ORDER — MAGNESIUM OXIDE -MG SUPPLEMENT 400 (240 MG) MG PO TABS: ORAL_TABLET | ORAL | 1 refills | Status: DC

## 2024-01-05 MED ORDER — LEVOTHYROXINE SODIUM 100 MCG PO TABS: 100.0000 ug | ORAL_TABLET | Freq: Every day | ORAL | 2 refills | Status: DC

## 2024-01-05 NOTE — Telephone Encounter (Signed)
 Pt's medications were sent to pt's pharmacy as requested. Confirmation received.

## 2024-01-05 NOTE — Telephone Encounter (Signed)
 Copied from CRM [id number]. Topic: General - Other >> Jan 05, 2024 12:13 PM Dondra Prader E wrote: Reason for CRM: Pt's wife called requesting to have all Rxs via [facility].

## 2024-01-05 NOTE — Telephone Encounter (Signed)
 Prescription refill request for Eliquis received. Indication: Afib  Last office visit: 05/21/23 Asa Lente)  Scr: 0.75 (09/04/23)  Age: 78 Weight: 77.1kg  Appropriate dose. Refill sent.

## 2024-01-05 NOTE — Telephone Encounter (Signed)
*  STAT* If patient is at the pharmacy, call can be transferred to refill team.   1. Which medications need to be refilled? (please list name of each medication and dose if known) apixaban  (ELIQUIS ) 5 MG TABS tablet  magnesium  oxide (MAG-OX) 400 (240 Mg) MG tablet  potassium chloride  SA (KLOR-CON  M20) 20 MEQ tablet   2. Which pharmacy/location (including street and city if local pharmacy) is medication to be sent to? [facility] - [place], [place] - [address]    3. Do they need a 30 day or 90 day supply? 90

## 2024-01-26 ENCOUNTER — Ambulatory Visit: Payer: Medicare (Managed Care) | Admitting: Urology

## 2024-03-15 ENCOUNTER — Other Ambulatory Visit: Payer: Self-pay | Admitting: Family Medicine

## 2024-03-18 ENCOUNTER — Other Ambulatory Visit: Payer: Self-pay

## 2024-03-18 DIAGNOSIS — I340 Nonrheumatic mitral (valve) insufficiency: Secondary | ICD-10-CM

## 2024-03-18 DIAGNOSIS — Z79899 Other long term (current) drug therapy: Secondary | ICD-10-CM

## 2024-03-18 DIAGNOSIS — I071 Rheumatic tricuspid insufficiency: Secondary | ICD-10-CM

## 2024-03-18 MED ORDER — MAGNESIUM OXIDE -MG SUPPLEMENT 400 (240 MG) MG PO TABS: ORAL_TABLET | ORAL | 0 refills | Status: DC

## 2024-04-07 ENCOUNTER — Encounter: Payer: Self-pay | Admitting: Family Medicine

## 2024-04-07 ENCOUNTER — Ambulatory Visit (INDEPENDENT_AMBULATORY_CARE_PROVIDER_SITE_OTHER): Payer: Medicare (Managed Care) | Admitting: Family Medicine

## 2024-04-07 VITALS — BP 130/78 | HR 63 | Ht 71.0 in | Wt 174.7 lb

## 2024-04-07 DIAGNOSIS — E039 Hypothyroidism, unspecified: Secondary | ICD-10-CM

## 2024-04-07 DIAGNOSIS — H04123 Dry eye syndrome of bilateral lacrimal glands: Secondary | ICD-10-CM

## 2024-04-07 DIAGNOSIS — R634 Abnormal weight loss: Secondary | ICD-10-CM

## 2024-04-07 DIAGNOSIS — F324 Major depressive disorder, single episode, in partial remission: Secondary | ICD-10-CM | POA: Diagnosis not present

## 2024-04-07 DIAGNOSIS — R739 Hyperglycemia, unspecified: Secondary | ICD-10-CM | POA: Diagnosis not present

## 2024-04-07 DIAGNOSIS — I10 Essential (primary) hypertension: Secondary | ICD-10-CM

## 2024-04-07 DIAGNOSIS — E7800 Pure hypercholesterolemia, unspecified: Secondary | ICD-10-CM | POA: Diagnosis not present

## 2024-04-07 DIAGNOSIS — Z7901 Long term (current) use of anticoagulants: Secondary | ICD-10-CM | POA: Diagnosis not present

## 2024-04-07 DIAGNOSIS — G4700 Insomnia, unspecified: Secondary | ICD-10-CM | POA: Diagnosis not present

## 2024-04-07 DIAGNOSIS — I480 Paroxysmal atrial fibrillation: Secondary | ICD-10-CM

## 2024-04-07 DIAGNOSIS — K219 Gastro-esophageal reflux disease without esophagitis: Secondary | ICD-10-CM

## 2024-04-07 NOTE — Assessment & Plan Note (Signed)
 Chronic R facial droop

## 2024-04-07 NOTE — Assessment & Plan Note (Signed)
 Blood pressure well-controlled with current medication regimen. Recent reading was 130/78 mmHg. - Continue current medications: losartan 25 mg daily and metoprolol 25 mg twice daily

## 2024-04-07 NOTE — Assessment & Plan Note (Signed)
 Atrial fibrillation well-controlled with magnesium and potassium supplements. No episodes in nearly three years. He prefers to continue Eliquis to reduce stroke risk. - Continue current medications: metoprolol 25 mg twice daily and Eliquis for anticoagulation

## 2024-04-07 NOTE — Progress Notes (Signed)
 Established patient visit   Patient: Eugene White   DOB: 1946-02-04   78 y.o. Male  MRN: [id number] Visit Date: 04/07/2024  Today's healthcare provider: Shirlee Latch, MD   Chief Complaint  Patient presents with   Establish Care    Patient is present to meet with Dr.B as he is transferrig his care to her due to previous provider Alfredia Ferguson, PA-C) no longer being in office. He reports he would like to be sure all his medications are still dosed correctly due to losing over 50 lbs in the last few years.    Hypertension    Ocassionaly takes at home with no concerns   Med Change Request    Pt would like to see about changing his hypertension rx due to insurance no longer covering    Subjective    Hypertension   HPI     Establish Care    Additional comments: Patient is present to meet with Dr.B as he is transferrig his care to her due to previous provider Alfredia Ferguson, PA-C) no longer being in office. He reports he would like to be sure all his medications are still dosed correctly due to losing over 50 lbs in the last few years.         Hypertension    Additional comments: Eugene White takes at home with no concerns        Med Change Request    Additional comments: Pt would like to see about changing his hypertension rx due to insurance no longer covering       Last edited by Acey Lav, CMA on 04/07/2024  2:11 PM.       Discussed the use of AI scribe software for clinical note transcription with the patient, who gave verbal consent to proceed.  History of Present Illness   Eugene White, a patient with a history of hypertension, atrial fibrillation, arthritis, spinal stenosis, and hypothyroidism, presents with unintentional weight loss of 50 pounds over the last three years. He reports a lack of appetite and cannot remember the last time he felt hungry. He also reports difficulty sleeping, often staying awake until 6 AM and sleeping until 2 PM. He  acknowledges that he does not get enough exercise and spends a lot of time reading on his Kindle.  He has been experiencing dry eyes, which he describes as uncomfortable and constantly watering. He uses Refresh eye drops for relief, but notes that there are no miracles for dry eye. He also reports that his eyes feel grainy and burn, which he believes could be a symptom of being overdosed on levothyroxine.  Eugene White also has arthritis and spinal stenosis, which he describes as distracting but manageable. He uses a cane for support and stability, especially when walking on uneven surfaces like grass. He has had multiple surgeries including rotator cuff surgeries, knee replacements, and a hip surgery. He reports that he has not fallen since he started using the cane.         Medications: Outpatient Medications Prior to Visit  Medication Sig   apixaban (ELIQUIS) 5 MG TABS tablet Take 1 tablet (5 mg total) by mouth 2 (two) times daily.   atorvastatin (LIPITOR) 20 MG tablet Take 1 tablet (20 mg total) by mouth daily.   Cholecalciferol (VITAMIN D) 2000 UNITS CAPS Take 2,000 Units by mouth every evening.    diphenhydramine-acetaminophen (TYLENOL PM) 25-500 MG TABS tablet Take 4 tablets by mouth at bedtime as needed (sleep).  ibuprofen (ADVIL) 200 MG tablet Take 800 mg by mouth daily as needed for moderate pain.   levothyroxine (SYNTHROID) 100 MCG tablet Take 1 tablet (100 mcg total) by mouth daily.   losartan (COZAAR) 25 MG tablet Take 1 tablet (25 mg total) by mouth daily.   magnesium oxide (MAG-OX) 400 (240 Mg) MG tablet TAKE 1 TABLET TWICE DAILY .   metoprolol tartrate (LOPRESSOR) 25 MG tablet Take 1 tablet (25 mg total) by mouth 2 (two) times daily.   Multiple Vitamin (MULTIVITAMIN WITH MINERALS) TABS tablet Take 1 tablet by mouth daily.    Multiple Vitamins-Minerals (PRESERVISION AREDS 2) CAPS Take 1 capsule by mouth 2 (two) times daily.    pantoprazole (PROTONIX) 40 MG tablet Take 1 tablet (40 mg  total) by mouth daily.   potassium chloride SA (KLOR-CON M20) 20 MEQ tablet Take 2 tablets (40 mEq total) by mouth daily.   venlafaxine XR (EFFEXOR-XR) 75 MG 24 hr capsule TAKE 1 CAPSULE EVERY DAY   [DISCONTINUED] oxyCODONE (ROXICODONE) 5 MG immediate release tablet Take 1 tablet (5 mg total) by mouth every 8 (eight) hours as needed.   amoxicillin (AMOXIL) 500 MG capsule Take 4 capsules by mouth as directed. Before dental appointment   [DISCONTINUED] Menthol, Topical Analgesic, (BLUE-EMU MAXIMUM STRENGTH EX) Apply 1 application  topically at bedtime as needed (pain). (Patient not taking: Reported on 04/07/2024)   No facility-administered medications prior to visit.    Review of Systems     Objective    BP 130/78 (BP Location: Left Arm, Patient Position: Sitting, Cuff Size: Normal)   Pulse 63   Ht 5\' 11"  (1.803 m)   Wt 174 lb 11.2 oz (79.2 kg)   SpO2 99%   BMI 24.37 kg/m    Physical Exam Vitals reviewed.  Constitutional:      General: He is not in acute distress.    Appearance: Normal appearance. He is not diaphoretic.  HENT:     Head: Normocephalic and atraumatic.  Eyes:     General: No scleral icterus.    Conjunctiva/sclera: Conjunctivae normal.  Cardiovascular:     Rate and Rhythm: Normal rate and regular rhythm.     Heart sounds: Normal heart sounds. No murmur heard. Pulmonary:     Effort: Pulmonary effort is normal. No respiratory distress.     Breath sounds: Normal breath sounds. No wheezing or rhonchi.  Musculoskeletal:     Cervical back: Neck supple.     Right lower leg: No edema.     Left lower leg: No edema.  Lymphadenopathy:     Cervical: No cervical adenopathy.  Skin:    General: Skin is warm and dry.     Findings: No rash.  Neurological:     Mental Status: He is alert and oriented to person, place, and time. Mental status is at baseline.     Comments: R sided facial droop  Psychiatric:        Mood and Affect: Mood normal.        Behavior: Behavior  normal.      No results found for any visits on 04/07/24.  Assessment & Plan     Problem List Items Addressed This Visit       Cardiovascular and Mediastinum   Essential (primary) hypertension - Primary   Blood pressure well-controlled with current medication regimen. Recent reading was 130/78 mmHg. - Continue current medications: losartan 25 mg daily and metoprolol 25 mg twice daily      Relevant Orders  Comprehensive metabolic panel with GFR   CBC w/Diff/Platelet   Paroxysmal atrial fibrillation (HCC)   Atrial fibrillation well-controlled with magnesium and potassium supplements. No episodes in nearly three years. He prefers to continue Eliquis to reduce stroke risk. - Continue current medications: metoprolol 25 mg twice daily and Eliquis for anticoagulation        Digestive   GERD (gastroesophageal reflux disease)   GERD well-controlled with pantoprazole. Symptoms recur if a dose is missed. - Continue pantoprazole as prescribed        Endocrine   Hypothyroidism   Currently on levothyroxine 100 mcg daily. Previous weight loss may be related to thyroid function. TSH levels need re-evaluation to determine if dosage adjustment is necessary. - Order TSH test to evaluate current thyroid function - Adjust levothyroxine dosage based on TSH results      Relevant Orders   TSH     Nervous and Auditory   Facial nerve injury, birth trauma   Chronic R facial droop        Other   Major depressive disorder with single episode   Depression managed with venlafaxine 75 mg daily. Medication helps maintain emotional stability. - Continue venlafaxine 75 mg daily      Pure hypercholesterolemia   Relevant Orders   Lipid Panel With LDL/HDL Ratio   Insomnia   Chronic difficulty falling asleep with a shifted sleep schedule, sleeping from early morning until afternoon. Contributing factors include lack of exercise and disrupted circadian rhythm. - Encourage regular exercise to  regulate sleep patterns - Advise on maintaining a consistent sleep schedule to reset internal clock      Anticoagulated   Other Visit Diagnoses       Hyperglycemia       Relevant Orders   Comprehensive metabolic panel with GFR   Hemoglobin A1c     Unintentional weight loss         Dry eye syndrome of both eyes               Unintentional Weight Loss Loss of 50 pounds over the last three years, potentially related to levothyroxine dosage or decreased appetite. No further weight loss in the past year. He is at a healthy BMI. Labs are needed to evaluate thyroid function and other potential causes. - Order lab tests including TSH, kidney and liver function, blood sugar, and blood counts - Encourage intake of protein-rich foods or supplements to maintain muscle mass  Dry Eye Syndrome Chronic dry eyes with burning and grainy sensation, possibly exacerbated by levothyroxine dosage. Currently using Refresh drops with limited relief. - Continue using Refresh eye drops for symptomatic relief  Osteoarthritis and Spinal Stenosis Chronic pain in the lumbosacral area and hips due to arthritis and spinal stenosis. Previous surgeries include hip fracture repair and knee replacements. Pain managed with Tylenol. Injections for spinal stenosis were discussed but not pursued due to age and variable pain levels. - Continue using Tylenol for pain management - Encourage use of recumbent exercise equipment to maintain mobility  General Health Maintenance Discussion of flu vaccination and general health screenings. He has not received a flu shot this year. - Offer flu vaccination during next visit in the fall  Follow-up Regular follow-up necessary to monitor ongoing health conditions and medication efficacy. Labs will be conducted today and results communicated via phone call. - Schedule follow-up visit in six months for physical examination and blood pressure  check - Arrange for lab tests today  and follow-up on results via  phone call       Return in about 6 months (around 10/07/2024) for CPE.       Total time spent on today's visit was greater than 40 minutes, including both face-to-face time and nonface-to-face time personally spent on review of chart (labs and imaging), discussing labs and goals, discussing further work-up, treatment options, answering patient's questions, and coordinating care.   Shirlee Latch, MD  [facility] [phone] (phone) [phone] (fax)  [facility]

## 2024-04-07 NOTE — Assessment & Plan Note (Signed)
 Chronic difficulty falling asleep with a shifted sleep schedule, sleeping from early morning until afternoon. Contributing factors include lack of exercise and disrupted circadian rhythm. - Encourage regular exercise to regulate sleep patterns - Advise on maintaining a consistent sleep schedule to reset internal clock

## 2024-04-07 NOTE — Assessment & Plan Note (Signed)
 Currently on levothyroxine 100 mcg daily. Previous weight loss may be related to thyroid function. TSH levels need re-evaluation to determine if dosage adjustment is necessary. - Order TSH test to evaluate current thyroid function - Adjust levothyroxine dosage based on TSH results

## 2024-04-07 NOTE — Assessment & Plan Note (Signed)
 Depression managed with venlafaxine 75 mg daily. Medication helps maintain emotional stability. - Continue venlafaxine 75 mg daily

## 2024-04-07 NOTE — Assessment & Plan Note (Signed)
 GERD well-controlled with pantoprazole. Symptoms recur if a dose is missed. - Continue pantoprazole as prescribed

## 2024-04-08 ENCOUNTER — Encounter: Payer: Self-pay | Admitting: Family Medicine

## 2024-04-08 LAB — CBC WITH DIFFERENTIAL/PLATELET
Basophils Absolute: 0 10*3/uL (ref 0.0–0.2)
Basos: 1 %
EOS (ABSOLUTE): 0.1 10*3/uL (ref 0.0–0.4)
Eos: 1 %
Hematocrit: 38.6 % (ref 37.5–51.0)
Hemoglobin: 13.2 g/dL (ref 13.0–17.7)
Immature Grans (Abs): 0 10*3/uL (ref 0.0–0.1)
Immature Granulocytes: 0 %
Lymphocytes Absolute: 1.2 10*3/uL (ref 0.7–3.1)
Lymphs: 23 %
MCH: 34.9 pg — ABNORMAL HIGH (ref 26.6–33.0)
MCHC: 34.2 g/dL (ref 31.5–35.7)
MCV: 102 fL — ABNORMAL HIGH (ref 79–97)
Monocytes Absolute: 0.5 10*3/uL (ref 0.1–0.9)
Monocytes: 10 %
Neutrophils Absolute: 3.4 10*3/uL (ref 1.4–7.0)
Neutrophils: 65 %
Platelets: 222 10*3/uL (ref 150–450)
RBC: 3.78 x10E6/uL — ABNORMAL LOW (ref 4.14–5.80)
RDW: 12.9 % (ref 11.6–15.4)
WBC: 5.2 10*3/uL (ref 3.4–10.8)

## 2024-04-08 LAB — LIPID PANEL WITH LDL/HDL RATIO
Cholesterol, Total: 172 mg/dL (ref 100–199)
HDL: 103 mg/dL (ref >39)
LDL Chol Calc (NIH): 55 mg/dL (ref 0–99)
LDL/HDL Ratio: 0.5 ratio (ref 0.0–3.6)
Triglycerides: 77 mg/dL (ref 0–149)
VLDL Cholesterol Cal: 14 mg/dL (ref 5–40)

## 2024-04-08 LAB — COMPREHENSIVE METABOLIC PANEL WITH GFR
ALT: 15 IU/L (ref 0–44)
AST: 31 IU/L (ref 0–40)
Albumin: 4.6 g/dL (ref 3.8–4.8)
Alkaline Phosphatase: 98 IU/L (ref 44–121)
BUN/Creatinine Ratio: 16 (ref 10–24)
BUN: 16 mg/dL (ref 8–27)
Bilirubin Total: 0.6 mg/dL (ref 0.0–1.2)
CO2: 22 mmol/L (ref 20–29)
Calcium: 9.6 mg/dL (ref 8.6–10.2)
Chloride: 100 mmol/L (ref 96–106)
Creatinine, Ser: 0.98 mg/dL (ref 0.76–1.27)
Globulin, Total: 2.2 g/dL (ref 1.5–4.5)
Glucose: 94 mg/dL (ref 70–99)
Potassium: 4.6 mmol/L (ref 3.5–5.2)
Sodium: 138 mmol/L (ref 134–144)
Total Protein: 6.8 g/dL (ref 6.0–8.5)
eGFR: 79 mL/min/{1.73_m2} (ref >59)

## 2024-04-08 LAB — HEMOGLOBIN A1C
Est. average glucose Bld gHb Est-mCnc: 85 mg/dL
Hgb A1c MFr Bld: 4.6 % — ABNORMAL LOW (ref 4.8–5.6)

## 2024-04-08 LAB — TSH: TSH: 1.58 u[IU]/mL (ref 0.450–4.500)

## 2024-04-20 ENCOUNTER — Telehealth: Payer: Self-pay | Admitting: Family Medicine

## 2024-04-20 ENCOUNTER — Other Ambulatory Visit: Payer: Self-pay

## 2024-04-20 DIAGNOSIS — I4891 Unspecified atrial fibrillation: Secondary | ICD-10-CM

## 2024-04-20 MED ORDER — ATORVASTATIN CALCIUM 20 MG PO TABS: 20.0000 mg | ORAL_TABLET | Freq: Every day | ORAL | 1 refills | Status: DC

## 2024-04-20 MED ORDER — APIXABAN 5 MG PO TABS: 5.0000 mg | ORAL_TABLET | Freq: Two times a day (BID) | ORAL | 1 refills | Status: AC

## 2024-04-20 NOTE — Telephone Encounter (Signed)
[facility] faxed refill request for the following medications:  atorvastatin (LIPITOR) 20 MG tablet   Please advise.

## 2024-04-20 NOTE — Telephone Encounter (Signed)
 Prescription refill request for Eliquis  received. Indication: Afib  Last office visit: 05/21/23 Eugene White)  Scr: 0.98 (04/07/24)  Age: 78 Weight: 79.2kg  Appropriate dose. Refill sent.

## 2024-05-16 ENCOUNTER — Ambulatory Visit: Payer: Medicare (Managed Care) | Attending: Internal Medicine | Admitting: Internal Medicine

## 2024-05-16 VITALS — BP 150/91 | HR 77 | Ht 71.0 in | Wt 180.4 lb

## 2024-05-16 DIAGNOSIS — I10 Essential (primary) hypertension: Secondary | ICD-10-CM

## 2024-05-16 DIAGNOSIS — I340 Nonrheumatic mitral (valve) insufficiency: Secondary | ICD-10-CM

## 2024-05-16 DIAGNOSIS — I779 Disorder of arteries and arterioles, unspecified: Secondary | ICD-10-CM

## 2024-05-16 DIAGNOSIS — I071 Rheumatic tricuspid insufficiency: Secondary | ICD-10-CM | POA: Diagnosis not present

## 2024-05-16 DIAGNOSIS — I4891 Unspecified atrial fibrillation: Secondary | ICD-10-CM

## 2024-05-16 DIAGNOSIS — Z79899 Other long term (current) drug therapy: Secondary | ICD-10-CM

## 2024-05-16 MED ORDER — MAGNESIUM OXIDE -MG SUPPLEMENT 400 (240 MG) MG PO TABS: ORAL_TABLET | ORAL | 3 refills | Status: DC

## 2024-05-16 MED ORDER — LOSARTAN POTASSIUM 50 MG PO TABS: 50.0000 mg | ORAL_TABLET | Freq: Every day | ORAL | 3 refills | Status: DC

## 2024-05-16 NOTE — Patient Instructions (Signed)
 Medication Instructions:  Your physician has recommended you make the following change in your medication:  INCREASE: losartan  to 50 mg by mouth once daily  *If you need a refill on your cardiac medications before your next appointment, please call your pharmacy*  Lab Work: IN 1-2 WEEKS: BMP  If you have labs (blood work) drawn today and your tests are completely normal, you will receive your results only by: MyChart Message (if you have MyChart) OR A paper copy in the mail If you have any lab test that is abnormal or we need to change your treatment, we will call you to review the results.  Testing/Procedures: NONE  Follow-Up: At [facility], you and your health needs are our priority.  As part of our continuing mission to provide you with exceptional heart care, our providers are all part of one team.  This team includes your primary Cardiologist (physician) and Advanced Practice Providers or APPs (Physician Assistants and Nurse Practitioners) who all work together to provide you with the care you need, when you need it.  Your next appointment:   12 month(s)  Provider:   Jann Melody, MD

## 2024-05-16 NOTE — Progress Notes (Signed)
 Cardiology Office Note:  .    Date:  05/16/2024  ID:  Eugene White, DOB [birth year], MRN [id number] PCP: Mazie Speed, MD  [facility] Providers Cardiologist:  Jann Melody, MD     CC: Transition to new cardiologist  History of Present Illness: .    Eugene White is a 78 y.o. male with paroxysmal atrial fibrillation and valve disease who presents for cardiovascular evaluation. He was referred by Dr. Ardell Beauvais for ongoing management of his cardiovascular conditions.  He has a history of paroxysmal atrial fibrillation, currently in sinus rhythm, with no episodes for several years since starting potassium and magnesium  oxide supplements. He has undergone four cardioversions, the last over three years ago, and is on beta blockers and anticoagulation therapy. Previous episodes were triggered by traumatic events or anesthesia.  He has mild mitral and tricuspid regurgitation, last imaged in 2022, and feels these conditions are manageable given his age. He also has a first degree heart block and left bundle branch block.  He has a history of mild carotid artery disease, last imaged in 2019, and declined further imaging at that time.  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 Anticoagulation therapy prevents the use of anti-inflammatory medications for arthritis. Using a recumbent bike aggravates his lower back.  He experienced renal bleeding after a fall, resulting in a hemoglobin level of eight, but is currently on anticoagulation without issues.  He has hypomagnesemia managed by his primary care doctor and requires a refill of magnesium  oxide.  He experiences problems sleeping at night and feels short-winded, attributing this to inactivity. No edema.  He has a history of sacral joint problems dating back to his late teens, related to sports activities, and has  experienced multiple injuries including a broken hip, ankle injuries, and torn rotator cuffs.  Discussed the use of AI scribe software for clinical note transcription with the patient, who gave verbal consent to proceed.   Relevant histories: .  Social  - comes with wife who is a Engineer, civil (consulting); we had a PhD in Physiology ROS: As per HPI.   Studies Reviewed: .     Cardiac Studies & Procedures   ______________________________________________________________________________________________     ECHOCARDIOGRAM  ECHOCARDIOGRAM COMPLETE 01/21/2021  Narrative ECHOCARDIOGRAM REPORT    Patient Name:   Eugene White Date of Exam: 01/21/2021 Medical Rec #:  [id number]        Height:       71.0 in Accession #:    [id number]       Weight:       224.8 lb Date of Birth:  20-Apr-1946        BSA:          2.216 m Patient Age:    74 years         BP:           134/78 mmHg Patient Gender: M                HR:           69 bpm. Exam Location:  [address]  Procedure: 2D Echo, Cardiac Doppler and Color Doppler  Indications:    I34.0 Mitral Valve Insufficiency I07.1 Tricuspid Valve Insufficiency  History:        Patient has prior history of Echocardiogram examinations, most recent 10/05/2017. Risk Factors:Hypertension and Dyslipidemia. Hypothyroidism. Pneumonia.  Sonographer:  Cheri Coria Rodgers-Jones RDCS Referring Phys: [id number] Debbie Fails NELSON  IMPRESSIONS   1. Left ventricular ejection fraction, by estimation, is 55 to 60%. The left ventricle has normal function. The left ventricle has no regional wall motion abnormalities. Left ventricular diastolic function could not be evaluated. 2. Right ventricular systolic function is normal. The right ventricular size is normal. 3. Left atrial size was mildly dilated. 4. The mitral valve is normal in structure. Mild mitral valve regurgitation. No evidence of mitral stenosis. 5. The aortic valve is normal in structure. There is mild calcification  of the aortic valve. There is moderate thickening of the aortic valve. Aortic valve regurgitation is not visualized. Mild to moderate aortic valve sclerosis/calcification is present, without any evidence of aortic stenosis. 6. The inferior vena cava is normal in size with greater than 50% respiratory variability, suggesting right atrial pressure of 3 mmHg.  Comparison(s): No significant change from prior study.  FINDINGS Left Ventricle: Left ventricular ejection fraction, by estimation, is 55 to 60%. The left ventricle has normal function. The left ventricle has no regional wall motion abnormalities. The left ventricular internal cavity size was normal in size. There is no left ventricular hypertrophy. Left ventricular diastolic function could not be evaluated due to atrial fibrillation. Left ventricular diastolic function could not be evaluated.  Right Ventricle: The right ventricular size is normal. No increase in right ventricular wall thickness. Right ventricular systolic function is normal.  Left Atrium: Left atrial size was mildly dilated.  Right Atrium: Right atrial size was normal in size.  Pericardium: There is no evidence of pericardial effusion.  Mitral Valve: The mitral valve is normal in structure. Mild mitral valve regurgitation. No evidence of mitral valve stenosis.  Tricuspid Valve: The tricuspid valve is normal in structure. Tricuspid valve regurgitation is mild . No evidence of tricuspid stenosis.  Aortic Valve: The aortic valve is normal in structure. There is mild calcification of the aortic valve. There is moderate thickening of the aortic valve. Aortic valve regurgitation is not visualized. Aortic regurgitation PHT measures 478 msec. Mild to moderate aortic valve sclerosis/calcification is present, without any evidence of aortic stenosis. Aortic valve mean gradient measures 7.0 mmHg. Aortic valve peak gradient measures 12.4 mmHg. Aortic valve area, by VTI measures 2.04  cm.  Pulmonic Valve: The pulmonic valve was normal in structure. Pulmonic valve regurgitation is not visualized. No evidence of pulmonic stenosis.  Aorta: The aortic root is normal in size and structure.  Venous: The inferior vena cava is normal in size with greater than 50% respiratory variability, suggesting right atrial pressure of 3 mmHg.  IAS/Shunts: No atrial level shunt detected by color flow Doppler.   LEFT VENTRICLE PLAX 2D LVIDd:         4.00 cm  Diastology LVIDs:         2.80 cm  LV e' medial:    4.46 cm/s LV PW:         1.20 cm  LV E/e' medial:  7.0 LV IVS:        1.20 cm  LV e' lateral:   8.81 cm/s LVOT diam:     2.20 cm  LV E/e' lateral: 3.5 LV SV:         73 LV SV Index:   33 LVOT Area:     3.80 cm   RIGHT VENTRICLE RV Basal diam:  5.10 cm RV S prime:     16.60 cm/s TAPSE (M-mode): 2.8 cm  LEFT ATRIUM  Index       RIGHT ATRIUM           Index LA diam:        4.90 cm 2.21 cm/m  RA Area:     16.60 cm LA Vol (A2C):   72.1 ml 32.54 ml/m RA Volume:   46.00 ml  20.76 ml/m LA Vol (A4C):   93.6 ml 42.24 ml/m LA Biplane Vol: 83.1 ml 37.50 ml/m AORTIC VALVE AV Area (Vmax):    2.18 cm AV Area (Vmean):   1.96 cm AV Area (VTI):     2.04 cm AV Vmax:           176.00 cm/s AV Vmean:          126.000 cm/s AV VTI:            0.358 m AV Peak Grad:      12.4 mmHg AV Mean Grad:      7.0 mmHg LVOT Vmax:         101.05 cm/s LVOT Vmean:        65.100 cm/s LVOT VTI:          0.192 m LVOT/AV VTI ratio: 0.54 AI PHT:            478 msec  AORTA Ao Root diam: 3.50 cm Ao Asc diam:  3.30 cm  MITRAL VALVE               TRICUSPID VALVE MV Area (PHT): 3.85 cm    TR Peak grad:   31.8 mmHg MV Decel Time: 197 msec    TR Vmax:        282.00 cm/s MV E velocity: 31.00 cm/s MV A velocity: 76.60 cm/s  SHUNTS MV E/A ratio:  0.40        Systemic VTI:  0.19 m Systemic Diam: 2.20 cm  Christoper Crafts MD Electronically signed by Christoper Crafts MD Signature  Date/Time: 01/21/2021/6:48:44 PM    Final   TEE  ECHO TEE 01/12/2018  Narrative *[place]* *[facility]* [address] [place], [place] [address] [phone]  ------------------------------------------------------------------- Transesophageal Echocardiography  Patient:    Eugene White, Eugene White MR #:       [id number] Study Date: 01/12/2018 Gender:     M Age:        71 Height:     180.3 cm Weight:     92.3 kg BSA:        2.17 m^2 Pt. Status: Room:  SONOGRAPHER  Dione Franks, RDCS, CCT ADMITTING    Christoper Crafts, M.D. ATTENDING    Christoper Crafts, M.D. ORDERING     Christoper Crafts, M.D. PERFORMING   Christoper Crafts, M.D. REFERRING    Christoper Crafts, M.D.  cc:  ------------------------------------------------------------------- LV EF: 55% -   60%  ------------------------------------------------------------------- Indications:      Atrial fibrillation - 427.31.  ------------------------------------------------------------------- Study Conclusions  - Left ventricle: Systolic function was normal. The estimated ejection fraction was in the range of 55% to 60%. Wall motion was normal; there were no regional wall motion abnormalities. - Aortic valve: There was mild regurgitation. - Mitral valve: There was moderate regurgitation. - Left atrium: The atrium was dilated. No evidence of thrombus in the atrial cavity or appendage. No evidence of thrombus in the atrial cavity or appendage. No evidence of thrombus in the atrial cavity or appendage. - Right atrium: The atrium was dilated. No evidence of thrombus in the atrial cavity or appendage. - Atrial septum: No defect or  patent foramen ovale was identified. - Tricuspid valve: There was moderate regurgitation.  Impressions:  - No cardiac source of emboli was indentified. The study was followed by a successful  cardioversion.  ------------------------------------------------------------------- Study data:   Study status:  Routine.  Consent:  The risks, benefits, and alternatives to the procedure were explained to the patient and informed consent was obtained.  Procedure:  Initial setup. The patient was brought to the laboratory. Surface ECG leads were monitored. Sedation. Sedation was administered by anesthesiology staff. Transesophageal echocardiography. An adult multiplane transesophageal probe was inserted by the attending cardiologistwithout difficulty. Image quality was adequate. Intravenous contrast (agitated saline) was administered.  Study completion:  The patient tolerated the procedure well. There were no complications.          Diagnostic transesophageal echocardiography.  2D and color Doppler.  Birthdate:  Patient birthdate: 22-Sep-1946.  Age:  Patient is 78 yr old.  Sex:  Gender: male.    BMI: 28.4 kg/m^2.  Blood pressure:     145/97  Patient status:  Inpatient.  Study date:  Study date: 01/12/2018. Study time: 02:08 PM.  Location:  Endoscopy.  -------------------------------------------------------------------  ------------------------------------------------------------------- Left ventricle:  Systolic function was normal. The estimated ejection fraction was in the range of 55% to 60%. Wall motion was normal; there were no regional wall motion abnormalities.  ------------------------------------------------------------------- Aortic valve:   Trileaflet; mildly thickened, mildly calcified leaflets. Cusp separation was normal.  Doppler:  There was mild regurgitation.  ------------------------------------------------------------------- Aorta:  There was no atheroma. There was no evidence for dissection. Aortic root: The aortic root was not dilated. Ascending aorta: The ascending aorta was normal in size. Descending aorta: The descending aorta was normal in  size.  ------------------------------------------------------------------- Mitral valve:   Mildly thickened leaflets . Leaflet separation was normal.  Doppler:  There was moderate regurgitation.  ------------------------------------------------------------------- Left atrium:  The atrium was dilated.  No evidence of thrombus in the atrial cavity or appendage.  No evidence of thrombus in the atrial cavity or appendage.  No evidence of thrombus in the atrial cavity or appendage. The appendage was morphologically a left appendage, multilobulated, and of normal size. Emptying velocity was normal.  ------------------------------------------------------------------- Atrial septum:  No defect or patent foramen ovale was identified.  ------------------------------------------------------------------- Right ventricle:  The cavity size was normal. Wall thickness was normal. Systolic function was normal.  ------------------------------------------------------------------- Pulmonic valve:    Structurally normal valve.    Doppler:  There was mild regurgitation.  ------------------------------------------------------------------- Tricuspid valve:   Structurally normal valve.   Leaflet separation was normal.  Doppler:  There was moderate regurgitation.  ------------------------------------------------------------------- Pulmonary artery:   The main pulmonary artery was normal-sized.  ------------------------------------------------------------------- Right atrium:  The atrium was dilated.  No evidence of thrombus in the atrial cavity or appendage. The appendage was morphologically a right appendage.  ------------------------------------------------------------------- Pericardium:  There was no pericardial effusion.  ------------------------------------------------------------------- Prepared and Electronically Authenticated by  Christoper Crafts, M.D. 2019-01-15T16:12:42         ______________________________________________________________________________________________       Physical Exam:    VS:  BP (!) 150/91 (BP Location: Right Arm)   Pulse 77   Ht 5\' 11"  (1.803 m)   Wt 180 lb 6.4 oz (81.8 kg)   SpO2 96%   BMI 25.16 kg/m    Wt Readings from Last 3 Encounters:  05/16/24 180 lb 6.4 oz (81.8 kg)  04/07/24 174 lb 11.2 oz (79.2 kg)  10/28/23 170 lb (77.1 kg)    Gen: no distress  Neck: No JVD, no bruits Cardiac: No Rubs or Gallops, no murmur, RRR +2 radial pulses Respiratory: Clear to auscultation bilaterally, normal effort, normal  respiratory rate GI: Soft, nontender, non-distended  MS: No  edema;  moves all extremities Integument: Skin feels warm Neuro:  At time of evaluation, alert and oriented to person/place/time/situation; known facial droop present Psych: Normal affect, patient feels ok   ASSESSMENT AND PLAN: .     An EKG was ordered for 1st HB and shows LBBB with 1st HB  Paroxysmal Atrial Fibrillation Currently in sinus rhythm with previous episodes triggered by traumatic events or anesthesia. Declined Watchman procedure, which involves left atrial appendage occlusion to reduce thromboembolic risk while minimizing hemorrhagic risk. Stroke risk post-procedure is comparable to anticoagulation in certain populations. - Continue beta blockers and anticoagulation - Reconsider Watchman procedure if future bleeding episodes or significant falls occur  Mitral and Tricuspid Regurgitation Mild mitral and tricuspid regurgitation noted on 2022 imaging. Declined echocardiogram for monitoring. Monitoring every three years in asymptomatic populations  First Degree Heart Block Asymptomatic first degree heart block and left bundle branch block with no sx of heart block  Hypertension Recent elevated readings likely exacerbated by decreased physical activity due to spinal stenosis and arthritis. Discussed importance of managing blood pressure to  prevent cardiovascular complications. Offered management plan given current limitations in physical activity due to pain. - Increase losartan  to 50 mg daily - Check basic metabolic panel in 1-2 weeks  Carotid Artery Disease Mild carotid artery disease last imaged in 2019. Declined repeat imaging. Repeat imaging is guideline-directed unless severe symptomatic stenosis necessitates intervention.  Spinal Stenosis and Arthritis Contributing to decreased physical activity and hypertension. Discussed limitations due to inability to use NSAIDs while on anticoagulation. Explored potential benefits of Watchman procedure to allow NSAID use, but he declined.  Hypomagnesemia - Requires a refill of magnesium  oxide. - Refill magnesium  oxide prescription  Goals of Care Discussed quality of life issues related to anticoagulation and joint pain management. He prefers to avoid invasive procedures unless future bleeding episodes or significant falls occur.  One year with me   Gloriann Larger, MD FASE [facility] Cardiologist [facility]  [address] [place], [address] [place], [place] [address] [phone]  3:24 PM

## 2024-05-26 ENCOUNTER — Other Ambulatory Visit: Payer: Self-pay

## 2024-05-26 DIAGNOSIS — I071 Rheumatic tricuspid insufficiency: Secondary | ICD-10-CM

## 2024-05-26 DIAGNOSIS — I340 Nonrheumatic mitral (valve) insufficiency: Secondary | ICD-10-CM

## 2024-05-26 DIAGNOSIS — Z79899 Other long term (current) drug therapy: Secondary | ICD-10-CM

## 2024-05-26 MED ORDER — POTASSIUM CHLORIDE CRYS ER 20 MEQ PO TBCR: 40.0000 meq | EXTENDED_RELEASE_TABLET | Freq: Every day | ORAL | 3 refills | Status: DC

## 2024-06-10 ENCOUNTER — Ambulatory Visit
Admission: RE | Admit: 2024-06-10 | Discharge: 2024-06-10 | Disposition: A | Discharge status: Home / Self Care | Payer: Medicare (Managed Care) | Attending: Family Medicine | Admitting: Family Medicine

## 2024-06-10 ENCOUNTER — Ambulatory Visit: Payer: Self-pay | Admitting: Family Medicine

## 2024-06-10 ENCOUNTER — Ambulatory Visit
Admission: RE | Admit: 2024-06-10 | Discharge: 2024-06-10 | Disposition: A | Discharge status: Home / Self Care | Payer: Medicare (Managed Care) | Source: Ambulatory Visit | Attending: Family Medicine | Admitting: Family Medicine

## 2024-06-10 ENCOUNTER — Ambulatory Visit (INDEPENDENT_AMBULATORY_CARE_PROVIDER_SITE_OTHER): Payer: Medicare (Managed Care) | Admitting: Family Medicine

## 2024-06-10 ENCOUNTER — Encounter: Payer: Self-pay | Admitting: Family Medicine

## 2024-06-10 VITALS — BP 136/94 | HR 67 | Ht 71.0 in | Wt 198.0 lb

## 2024-06-10 DIAGNOSIS — E559 Vitamin D deficiency, unspecified: Secondary | ICD-10-CM

## 2024-06-10 DIAGNOSIS — F324 Major depressive disorder, single episode, in partial remission: Secondary | ICD-10-CM

## 2024-06-10 DIAGNOSIS — R635 Abnormal weight gain: Secondary | ICD-10-CM | POA: Insufficient documentation

## 2024-06-10 DIAGNOSIS — R630 Anorexia: Secondary | ICD-10-CM | POA: Diagnosis not present

## 2024-06-10 DIAGNOSIS — R058 Other specified cough: Secondary | ICD-10-CM

## 2024-06-10 DIAGNOSIS — I480 Paroxysmal atrial fibrillation: Secondary | ICD-10-CM

## 2024-06-10 DIAGNOSIS — E039 Hypothyroidism, unspecified: Secondary | ICD-10-CM | POA: Diagnosis not present

## 2024-06-10 DIAGNOSIS — D649 Anemia, unspecified: Secondary | ICD-10-CM

## 2024-06-10 DIAGNOSIS — R5381 Other malaise: Secondary | ICD-10-CM | POA: Diagnosis not present

## 2024-06-10 DIAGNOSIS — R5383 Other fatigue: Secondary | ICD-10-CM | POA: Diagnosis not present

## 2024-06-10 DIAGNOSIS — E8779 Other fluid overload: Secondary | ICD-10-CM

## 2024-06-10 NOTE — Progress Notes (Signed)
 "     ACUTE VISIT   Patient: Eugene White   DOB: 12/11/1946   78 y.o. Male  MRN: [id number]   PCP: Myrla Jon HERO, MD  Chief Complaint  Patient presents with   Fatigue    Always tired and feel weak    Subjective    HPI HPI     Fatigue    Additional comments: Always tired and feel weak       Last edited by Thelbert Eulalio HERO, CMA on 06/10/2024  2:54 PM.       Discussed the use of AI scribe software for clinical note transcription with the patient, who gave verbal consent to proceed.  History of Present Illness Eugene White is a 78 year old male with tricuspid regurgitation, atrial fibrillation, mitral regurgitation, and hypothyroidism who presents with malaise and fatigue.  He experiences constant fatigue and weakness, with dyspnea that worsens upon standing, necessitating the use of a wheelchair. He describes needing to 'haul a lot of air in to get anything to happen.'  He has gained approximately 25 pounds suddenly, despite previously losing 50 pounds over the last three years, attributing the gain to fluid retention. He has pitting edema in his lower legs and scrotal swelling. He also reports a loss of appetite, and his family member notes that the weakness has come on suddenly.  He has a history of tricuspid regurgitation, atrial fibrillation, mitral regurgitation, and hypothyroidism. His last TSH was 1.5 in April. He saw his cardiologist two weeks ago, who increased his losartan  to 50 mg. He has been on this adjusted dosage for about two weeks.  He reports urinary issues, including a noticeable decrease in urinary output and an urgent urge to urinate with little volume. He also mentions spinal stenosis and lumbosacral arthritis, which he believes contribute to his urinary symptoms.  No new medications or antibiotics aside from the increased losartan  dosage. He has not seen a urologist in a couple of years but has previously seen a provider named Rosina Riis.  He reports a cough, which he describes as more like postnasal discharge. No fevers or chills.     Medications: Outpatient Medications Prior to Visit  Medication Sig   acetaminophen  (TYLENOL ) 500 MG tablet Take 1,500 mg by mouth every 6 (six) hours as needed for moderate pain (pain score 4-6).   amoxicillin (AMOXIL) 500 MG capsule Take 4 capsules by mouth as directed. Before dental appointment   apixaban  (ELIQUIS ) 5 MG TABS tablet Take 1 tablet (5 mg total) by mouth 2 (two) times daily.   atorvastatin  (LIPITOR) 20 MG tablet Take 1 tablet (20 mg total) by mouth daily.   Cholecalciferol  (VITAMIN D ) 2000 UNITS CAPS Take 2,000 Units by mouth every evening.    levothyroxine  (SYNTHROID ) 100 MCG tablet Take 1 tablet (100 mcg total) by mouth daily.   losartan  (COZAAR ) 50 MG tablet Take 1 tablet (50 mg total) by mouth daily.   magnesium  oxide (MAG-OX) 400 (240 Mg) MG tablet TAKE 1 TABLET TWICE DAILY .   metoprolol  tartrate (LOPRESSOR ) 25 MG tablet Take 1 tablet (25 mg total) by mouth 2 (two) times daily.   Multiple Vitamin (MULTIVITAMIN WITH MINERALS) TABS tablet Take 1 tablet by mouth daily.    Multiple Vitamins-Minerals (PRESERVISION AREDS 2) CAPS Take 1 capsule by mouth 2 (two) times daily.    pantoprazole  (PROTONIX ) 40 MG tablet Take 1 tablet (40 mg total) by mouth daily.   potassium chloride  SA (KLOR-CON  M20) 20  MEQ tablet Take 2 tablets (40 mEq total) by mouth daily.   venlafaxine  XR (EFFEXOR -XR) 75 MG 24 hr capsule TAKE 1 CAPSULE EVERY DAY   No facility-administered medications prior to visit.    Last CBC Lab Results  Component Value Date   WBC 5.2 04/07/2024   HGB 13.2 04/07/2024   HCT 38.6 04/07/2024   MCV 102 (H) 04/07/2024   MCH 34.9 (H) 04/07/2024   RDW 12.9 04/07/2024   PLT 222 04/07/2024   Last metabolic panel Lab Results  Component Value Date   GLUCOSE 94 04/07/2024   NA 138 04/07/2024   K 4.6 04/07/2024   CL 100 04/07/2024   CO2 22 04/07/2024   BUN 16  04/07/2024   CREATININE 0.98 04/07/2024   EGFR 79 04/07/2024   CALCIUM  9.6 04/07/2024   PHOS 2.7 10/10/2016   PROT 6.8 04/07/2024   ALBUMIN 4.6 04/07/2024   LABGLOB 2.2 04/07/2024   AGRATIO 2.0 04/02/2023   BILITOT 0.6 04/07/2024   ALKPHOS 98 04/07/2024   AST 31 04/07/2024   ALT 15 04/07/2024   ANIONGAP 10 07/18/2022   Lab Results  Component Value Date   TSH 1.580 04/07/2024         Objective    BP (!) 136/94   Pulse 67   Ht 5' 11 (1.803 m)   Wt 198 lb (89.8 kg)   SpO2 100%   BMI 27.62 kg/m   BP Readings from Last 3 Encounters:  06/10/24 (!) 136/94  05/16/24 (!) 150/91  04/07/24 130/78   Wt Readings from Last 3 Encounters:  06/10/24 198 lb (89.8 kg)  05/16/24 180 lb 6.4 oz (81.8 kg)  04/07/24 174 lb 11.2 oz (79.2 kg)      Physical Exam   Physical Exam GENERAL: Elderly male, no acute distress, tired appearing, non-toxic. CHEST: Lungs clear to auscultation bilaterally, no wheezing or crackles. CARDIOVASCULAR: Regular rate and rhythm, regurgitation, murmurs. ABDOMEN: Abdomen not distended, non-tender. EXTREMITIES: 1+ pitting edema in lower extremities bilaterally beneath knees. NEUROLOGICAL: Right chronic facial droop.   No results found for any visits on 06/10/24.  Assessment & Plan     Assessment & Plan Malaise and Fatigue Chronic fatigue and weakness with recent exacerbation. Significant weight loss over three years, recent weight gain, fluid retention, shortness of breath on standing, and pitting edema in lower extremities. Differential includes heart failure, atrial fibrillation exacerbation, or thyroid  dysfunction. Recent losartan  dosage increase may contribute. Plan includes labs for anemia, thyroid  dysfunction, heart strain, and imaging for fluid overload. - Order CBC and metabolic panel to assess anemia, kidney, liver, and electrolytes - Order TSH, T4, T3 for thyroid  function - Order vitamin D  and B12 levels - Order BNP for heart strain - Order  chest x-ray for fluid overload or pulmonary issues  Scrotal Swelling Likely related to fluid retention and possibly linked to cardiac issues.  Urinary Retention Decreased urinary output with urgency and low volume. Spinal stenosis and lumbosacral arthritis may contribute. Consider urinary tract infection or obstruction. Plan includes urine culture to rule out infection. - Obtain urine sample for culture  Goals of Care Daughter, a colorectal surgeon, requested additional vitamin level checks, including zinc , thiamine , and folate, reflecting proactive family approach. - Order zinc , thiamine , and folate levels  Follow-up Scheduled follow-up with Doctor B in October, but earlier follow-up preferred to address current issues. Plan includes coordinating follow-up and imaging to expedite diagnosis and management. - Schedule follow-up with Doctor B in one month - Direct to outpatient  imaging center for chest x-ray after lab work      Return in about 1 month (around 07/10/2024) for CHRONIC F/U.        Rockie Agent, MD  [facility] [phone] (phone) [phone] (fax)  [facility] "

## 2024-06-10 NOTE — Patient Instructions (Addendum)
 Please report to [facility] located at:  [address]  [place], [place] [address]  You do not need an appointment to have xrays completed.   Our office will follow up with  results once available.   VISIT SUMMARY: You came in today because you have been feeling very tired and weak, with difficulty breathing, sudden weight gain, and swelling in your legs and scrotum. You also mentioned having urinary issues and a cough. We discussed your symptoms and medical history, including your heart conditions and hypothyroidism.  YOUR PLAN: -MALAISE AND FATIGUE: Malaise and fatigue mean feeling generally unwell and very tired. We will check your blood for anemia, thyroid  function, vitamin levels, and heart strain. We will also get a chest x-ray to look for fluid in your lungs or other issues.  -SCROTAL SWELLING: Scrotal swelling is likely due to fluid retention, possibly related to your heart issues. We will monitor this as we address the fluid retention.  -URINARY RETENTION: Urinary retention means you are having trouble emptying your bladder. This could be due to your spinal issues or a possible infection. I recommend that you discuss a urine sample with your PCP when you are able to provide a sample.   -GOALS OF CARE: Your daughter requested additional tests for vitamin levels, including zinc, thiamine, and folate, to ensure you are getting all the nutrients you need. We will include these tests in your lab work.  INSTRUCTIONS: Please follow up with Doctor B in one month. Go to the outpatient imaging center for a chest x-ray after completing your lab work.

## 2024-06-13 ENCOUNTER — Telehealth: Payer: Self-pay | Admitting: Internal Medicine

## 2024-06-13 MED ORDER — FUROSEMIDE 40 MG PO TABS: 40.0000 mg | ORAL_TABLET | Freq: Every day | ORAL | 3 refills | Status: DC

## 2024-06-13 NOTE — Telephone Encounter (Signed)
 Pt c/o swelling/edema: STAT if pt has developed SOB within 24 hours  If swelling, where is the swelling located? Legs and abdomen   How much weight have you gained and in what time span?  05/16/24: 180  06/10/24 -198   Have you gained 2 pounds in a day or 5 pounds in a week? Not sure   Do you have a log of your daily weights (if so, list)? See above  Are you currently taking a fluid pill? No   Are you currently SOB? Yes   Have you traveled recently in a car or plane for an extended period of time? No.   Pt PCP talked to him today states  he needs to see Cardiology urgently.

## 2024-06-13 NOTE — Telephone Encounter (Signed)
 Patient identification verified by 2 forms. Eugene Duck, RN     Called and spoke to patient  Patient states:  - gained 20lbs in the last 2-3 weeks.  05/16/24: 180  06/10/24 -198  - legs and abdomen swelling  - short of breath  - no energy and standing up has become difficulty. This started around 05/28/2024.  - has PCP appt on Monday - PCP wants patient to go to ED as well for IV therapy.   Patient denies:  - chest pain, vision changes, slurred speech, one-side muscle weakness, N/V at time of call.              Interventions/Plan: - Office visit scheduled first available with APP on 6/27. Patient referred to be evaluated at ED today based on symptoms present at time of call. Patient stated he does have someone who can take him to ED, and would get that done this week. Reiterated that recommendation is for patient to have symptoms evaluated today.    Reviewed ED warning signs/precautions  Patient verbalized understanding, no further questions at this time

## 2024-06-15 ENCOUNTER — Telehealth: Payer: Self-pay | Admitting: Internal Medicine

## 2024-06-15 NOTE — Telephone Encounter (Signed)
 Called spoke with pt in regards to labs.  Pt reports had labs drawn in [place] on 06/10/24.  Would like MD to review.   Advised pt potassium and kidney function are normal. MD requested labs at 05/16/24 OV d/t losartan  increase to 50 mg PO every day.  Pt also reports has gained 20 pounds in 3 weeks.  Was started on furosemide  40 mg PO every day.  PCP recommends ED visit for IV lasix  to help with SOB and swelling.  Pt reports can  barely walk across the room without feeling extremely winded.  I advised pt to follow PCP recommendations.  Pt reports will go on Friday would like to give furosemide  a chance to work.  Has lost 5lbs in 3 days.  Again advised pt if feels bad to go to ED for evaluation.   Pt would like to make MD aware that had labs drawn and of change in status.  Will route to MD.

## 2024-06-15 NOTE — Telephone Encounter (Signed)
 Pt would like a c/b in regards to blood work results. Please advise

## 2024-06-17 ENCOUNTER — Emergency Department: Payer: Medicare (Managed Care)

## 2024-06-17 ENCOUNTER — Inpatient Hospital Stay
Admission: EM | Admit: 2024-06-17 | Discharge: 2024-06-24 | DRG: 286 | Disposition: A | Discharge status: Home Health | Payer: Medicare (Managed Care) | Attending: Internal Medicine | Admitting: Internal Medicine

## 2024-06-17 ENCOUNTER — Other Ambulatory Visit: Payer: Self-pay

## 2024-06-17 ENCOUNTER — Encounter: Payer: Self-pay | Admitting: Intensive Care

## 2024-06-17 DIAGNOSIS — I340 Nonrheumatic mitral (valve) insufficiency: Secondary | ICD-10-CM | POA: Diagnosis present

## 2024-06-17 DIAGNOSIS — I509 Heart failure, unspecified: Secondary | ICD-10-CM

## 2024-06-17 DIAGNOSIS — I272 Pulmonary hypertension, unspecified: Secondary | ICD-10-CM | POA: Diagnosis present

## 2024-06-17 DIAGNOSIS — I5021 Acute systolic (congestive) heart failure: Secondary | ICD-10-CM

## 2024-06-17 DIAGNOSIS — K219 Gastro-esophageal reflux disease without esophagitis: Secondary | ICD-10-CM | POA: Diagnosis present

## 2024-06-17 DIAGNOSIS — N3289 Other specified disorders of bladder: Secondary | ICD-10-CM | POA: Diagnosis present

## 2024-06-17 DIAGNOSIS — I5031 Acute diastolic (congestive) heart failure: Secondary | ICD-10-CM | POA: Diagnosis not present

## 2024-06-17 DIAGNOSIS — R188 Other ascites: Secondary | ICD-10-CM | POA: Diagnosis present

## 2024-06-17 DIAGNOSIS — Z8701 Personal history of pneumonia (recurrent): Secondary | ICD-10-CM

## 2024-06-17 DIAGNOSIS — I358 Other nonrheumatic aortic valve disorders: Secondary | ICD-10-CM | POA: Diagnosis present

## 2024-06-17 DIAGNOSIS — N1330 Unspecified hydronephrosis: Secondary | ICD-10-CM | POA: Diagnosis not present

## 2024-06-17 DIAGNOSIS — E669 Obesity, unspecified: Secondary | ICD-10-CM | POA: Insufficient documentation

## 2024-06-17 DIAGNOSIS — N320 Bladder-neck obstruction: Secondary | ICD-10-CM | POA: Diagnosis present

## 2024-06-17 DIAGNOSIS — I10 Essential (primary) hypertension: Secondary | ICD-10-CM | POA: Diagnosis present

## 2024-06-17 DIAGNOSIS — D539 Nutritional anemia, unspecified: Secondary | ICD-10-CM | POA: Diagnosis present

## 2024-06-17 DIAGNOSIS — Z87442 Personal history of urinary calculi: Secondary | ICD-10-CM

## 2024-06-17 DIAGNOSIS — Z803 Family history of malignant neoplasm of breast: Secondary | ICD-10-CM

## 2024-06-17 DIAGNOSIS — E785 Hyperlipidemia, unspecified: Secondary | ICD-10-CM | POA: Diagnosis present

## 2024-06-17 DIAGNOSIS — I2510 Atherosclerotic heart disease of native coronary artery without angina pectoris: Secondary | ICD-10-CM | POA: Diagnosis present

## 2024-06-17 DIAGNOSIS — E876 Hypokalemia: Secondary | ICD-10-CM | POA: Diagnosis not present

## 2024-06-17 DIAGNOSIS — E8779 Other fluid overload: Secondary | ICD-10-CM

## 2024-06-17 DIAGNOSIS — N401 Enlarged prostate with lower urinary tract symptoms: Secondary | ICD-10-CM | POA: Diagnosis present

## 2024-06-17 DIAGNOSIS — K7689 Other specified diseases of liver: Secondary | ICD-10-CM | POA: Diagnosis present

## 2024-06-17 DIAGNOSIS — I5043 Acute on chronic combined systolic (congestive) and diastolic (congestive) heart failure: Secondary | ICD-10-CM | POA: Diagnosis present

## 2024-06-17 DIAGNOSIS — E871 Hypo-osmolality and hyponatremia: Secondary | ICD-10-CM | POA: Diagnosis present

## 2024-06-17 DIAGNOSIS — N179 Acute kidney failure, unspecified: Secondary | ICD-10-CM | POA: Diagnosis not present

## 2024-06-17 DIAGNOSIS — E66811 Obesity, class 1: Secondary | ICD-10-CM | POA: Diagnosis present

## 2024-06-17 DIAGNOSIS — I110 Hypertensive heart disease with heart failure: Secondary | ICD-10-CM | POA: Diagnosis not present

## 2024-06-17 DIAGNOSIS — R1084 Generalized abdominal pain: Secondary | ICD-10-CM | POA: Diagnosis present

## 2024-06-17 DIAGNOSIS — I420 Dilated cardiomyopathy: Secondary | ICD-10-CM | POA: Diagnosis present

## 2024-06-17 DIAGNOSIS — R338 Other retention of urine: Secondary | ICD-10-CM | POA: Diagnosis present

## 2024-06-17 DIAGNOSIS — I700 Atherosclerosis of aorta: Secondary | ICD-10-CM | POA: Diagnosis present

## 2024-06-17 DIAGNOSIS — E039 Hypothyroidism, unspecified: Secondary | ICD-10-CM | POA: Diagnosis not present

## 2024-06-17 DIAGNOSIS — I447 Left bundle-branch block, unspecified: Secondary | ICD-10-CM | POA: Diagnosis not present

## 2024-06-17 DIAGNOSIS — R0602 Shortness of breath: Secondary | ICD-10-CM | POA: Diagnosis present

## 2024-06-17 DIAGNOSIS — I480 Paroxysmal atrial fibrillation: Secondary | ICD-10-CM | POA: Diagnosis present

## 2024-06-17 DIAGNOSIS — I440 Atrioventricular block, first degree: Secondary | ICD-10-CM | POA: Diagnosis present

## 2024-06-17 DIAGNOSIS — Z9981 Dependence on supplemental oxygen: Secondary | ICD-10-CM

## 2024-06-17 DIAGNOSIS — F1090 Alcohol use, unspecified, uncomplicated: Secondary | ICD-10-CM

## 2024-06-17 DIAGNOSIS — Z604 Social exclusion and rejection: Secondary | ICD-10-CM | POA: Diagnosis present

## 2024-06-17 DIAGNOSIS — Z79890 Hormone replacement therapy (postmenopausal): Secondary | ICD-10-CM

## 2024-06-17 DIAGNOSIS — Z7901 Long term (current) use of anticoagulants: Secondary | ICD-10-CM

## 2024-06-17 DIAGNOSIS — F1010 Alcohol abuse, uncomplicated: Secondary | ICD-10-CM | POA: Diagnosis present

## 2024-06-17 DIAGNOSIS — M4800 Spinal stenosis, site unspecified: Secondary | ICD-10-CM | POA: Diagnosis present

## 2024-06-17 DIAGNOSIS — N400 Enlarged prostate without lower urinary tract symptoms: Secondary | ICD-10-CM | POA: Diagnosis not present

## 2024-06-17 DIAGNOSIS — Z87891 Personal history of nicotine dependence: Secondary | ICD-10-CM

## 2024-06-17 DIAGNOSIS — R600 Localized edema: Principal | ICD-10-CM

## 2024-06-17 DIAGNOSIS — Z96653 Presence of artificial knee joint, bilateral: Secondary | ICD-10-CM | POA: Diagnosis present

## 2024-06-17 DIAGNOSIS — Z882 Allergy status to sulfonamides status: Secondary | ICD-10-CM

## 2024-06-17 DIAGNOSIS — Z6826 Body mass index (BMI) 26.0-26.9, adult: Secondary | ICD-10-CM

## 2024-06-17 DIAGNOSIS — R339 Retention of urine, unspecified: Secondary | ICD-10-CM | POA: Diagnosis not present

## 2024-06-17 DIAGNOSIS — Q670 Congenital facial asymmetry: Secondary | ICD-10-CM

## 2024-06-17 DIAGNOSIS — I071 Rheumatic tricuspid insufficiency: Secondary | ICD-10-CM

## 2024-06-17 DIAGNOSIS — Z79899 Other long term (current) drug therapy: Secondary | ICD-10-CM

## 2024-06-17 LAB — CBC
HCT: 34.5 % — ABNORMAL LOW (ref 39.0–52.0)
Hematocrit: 38.1 % (ref 37.5–51.0)
Hemoglobin: 12.6 g/dL — ABNORMAL LOW (ref 13.0–17.7)
Hemoglobin: 12.2 g/dL — ABNORMAL LOW (ref 13.0–17.0)
MCH: 34.9 pg — ABNORMAL HIGH (ref 26.6–33.0)
MCH: 35 pg — ABNORMAL HIGH (ref 26.0–34.0)
MCHC: 33.1 g/dL (ref 31.5–35.7)
MCHC: 35.4 g/dL (ref 30.0–36.0)
MCV: 106 fL — ABNORMAL HIGH (ref 79–97)
MCV: 98.9 fL (ref 80.0–100.0)
Platelets: 261 10*3/uL (ref 150–450)
Platelets: 212 10*3/uL (ref 150–400)
RBC: 3.61 x10E6/uL — ABNORMAL LOW (ref 4.14–5.80)
RBC: 3.49 MIL/uL — ABNORMAL LOW (ref 4.22–5.81)
RDW: 12.9 % (ref 11.6–15.4)
RDW: 15 % (ref 11.5–15.5)
WBC: 5.8 10*3/uL (ref 3.4–10.8)
WBC: 4.8 10*3/uL (ref 4.0–10.5)
nRBC: 0 % (ref 0.0–0.2)

## 2024-06-17 LAB — VITAMIN D 25 HYDROXY (VIT D DEFICIENCY, FRACTURES): Vit D, 25-Hydroxy: 51.2 ng/mL (ref 30.0–100.0)

## 2024-06-17 LAB — CMP14+EGFR
ALT: 14 IU/L (ref 0–44)
AST: 28 IU/L (ref 0–40)
Albumin: 4 g/dL (ref 3.8–4.8)
Alkaline Phosphatase: 84 IU/L (ref 44–121)
BUN/Creatinine Ratio: 15 (ref 10–24)
BUN: 15 mg/dL (ref 8–27)
Bilirubin Total: 0.8 mg/dL (ref 0.0–1.2)
CO2: 16 mmol/L — ABNORMAL LOW (ref 20–29)
Calcium: 9.2 mg/dL (ref 8.6–10.2)
Chloride: 96 mmol/L (ref 96–106)
Creatinine, Ser: 1.03 mg/dL (ref 0.76–1.27)
Globulin, Total: 1.8 g/dL (ref 1.5–4.5)
Glucose: 90 mg/dL (ref 70–99)
Potassium: 5.1 mmol/L (ref 3.5–5.2)
Sodium: 126 mmol/L — ABNORMAL LOW (ref 134–144)
Total Protein: 5.8 g/dL — ABNORMAL LOW (ref 6.0–8.5)
eGFR: 74 mL/min/{1.73_m2} (ref >59)

## 2024-06-17 LAB — HEPATIC FUNCTION PANEL
ALT: 18 U/L (ref 0–44)
AST: 30 U/L (ref 15–41)
Albumin: 3.7 g/dL (ref 3.5–5.0)
Alkaline Phosphatase: 58 U/L (ref 38–126)
Bilirubin, Direct: 0.2 mg/dL (ref 0.0–0.2)
Indirect Bilirubin: 1.1 mg/dL — ABNORMAL HIGH (ref 0.3–0.9)
Total Bilirubin: 1.3 mg/dL — ABNORMAL HIGH (ref 0.0–1.2)
Total Protein: 5.8 g/dL — ABNORMAL LOW (ref 6.5–8.1)

## 2024-06-17 LAB — BRAIN NATRIURETIC PEPTIDE
B Natriuretic Peptide: >4500 pg/mL — ABNORMAL HIGH (ref 0.0–100.0)
BNP: 4407.4 pg/mL — ABNORMAL HIGH (ref 0.0–100.0)

## 2024-06-17 LAB — PROTIME-INR
INR: 1.6 — ABNORMAL HIGH (ref 0.8–1.2)
Prothrombin Time: 18.8 s — ABNORMAL HIGH (ref 11.4–15.2)

## 2024-06-17 LAB — BASIC METABOLIC PANEL WITH GFR
Anion gap: 11 (ref 5–15)
BUN: 21 mg/dL (ref 8–23)
CO2: 20 mmol/L — ABNORMAL LOW (ref 22–32)
Calcium: 8.9 mg/dL (ref 8.9–10.3)
Chloride: 98 mmol/L (ref 98–111)
Creatinine, Ser: 1.18 mg/dL (ref 0.61–1.24)
GFR, Estimated: >60 mL/min (ref >60)
Glucose, Bld: 123 mg/dL — ABNORMAL HIGH (ref 70–99)
Potassium: 4.2 mmol/L (ref 3.5–5.1)
Sodium: 129 mmol/L — ABNORMAL LOW (ref 135–145)

## 2024-06-17 LAB — VITAMIN B12: Vitamin B-12: 769 pg/mL (ref 232–1245)

## 2024-06-17 LAB — ZINC: Zinc: 188 ug/dL — ABNORMAL HIGH (ref 44–115)

## 2024-06-17 LAB — TSH+T4F+T3FREE
Free T4: 1.19 ng/dL (ref 0.82–1.77)
T3, Free: 1.4 pg/mL — ABNORMAL LOW (ref 2.0–4.4)
TSH: 5.19 u[IU]/mL — ABNORMAL HIGH (ref 0.450–4.500)

## 2024-06-17 LAB — LACTIC ACID, PLASMA: Lactic Acid, Venous: 1.8 mmol/L (ref 0.5–1.9)

## 2024-06-17 LAB — VITAMIN B1: Thiamine: 141 nmol/L (ref 66.5–200.0)

## 2024-06-17 LAB — TROPONIN I (HIGH SENSITIVITY): Troponin I (High Sensitivity): 34 ng/L — ABNORMAL HIGH (ref <18)

## 2024-06-17 MED ORDER — FUROSEMIDE 10 MG/ML IJ SOLN
80.0000 mg | Freq: Two times a day (BID) | INTRAMUSCULAR | Status: DC
  Filled 2024-06-17: qty 8

## 2024-06-17 MED ORDER — FUROSEMIDE 10 MG/ML IJ SOLN
40.0000 mg | Freq: Two times a day (BID) | INTRAMUSCULAR | Status: DC
  Administered 2024-06-18 – 2024-06-21 (×8): 40 mg via INTRAVENOUS
  Filled 2024-06-17 (×10): qty 4

## 2024-06-17 MED ORDER — FUROSEMIDE 10 MG/ML IJ SOLN
40.0000 mg | Freq: Once | INTRAMUSCULAR | Status: AC
  Administered 2024-06-17: 40 mg via INTRAVENOUS
  Filled 2024-06-17: qty 4

## 2024-06-17 NOTE — H&P (Addendum)
 History and Physical    Patient: Eugene White:[id number] DOB: Feb 15, 1946 DOA: 06/17/2024 DOS: the patient was seen and examined on 06/17/2024 PCP: Mazie Speed, MD  Patient coming from: Home  Chief Complaint:  Chief Complaint  Patient presents with   Shortness of Breath   Leg Swelling       HPI:  Eugene White is a 78 y.o. M with HTN, obesity, pAF on Eliquis , hypothyroidism who presented with SOB, swelling, fatigue.    Patient lost about 50 lbs over the last year, but in the last month, has gained 20 lbs, and during the same time, has noticed swelling in his abdomen and legs.  Finally, he went to PCP's office, we here he was noted to have BNP >4000, and started on Lasix .    He trialed this at home and had marginal improvement, but was still too SOB to walk across the room so he came to the ER.  There, he was found to have Na 129, normal Cr, BNP >4500.  INR 1.6.  CXR basilar edema. 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   Started on IV Lasix , hospitalists admitted.      Review of Systems  Constitutional:  Negative for chills, fever and malaise/fatigue.  Respiratory:  Positive for shortness of breath. Negative for cough, hemoptysis, sputum production and wheezing.   Cardiovascular:  Positive for orthopnea, leg swelling and PND. Negative for chest pain, palpitations and claudication.  All other systems reviewed and are negative.    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   First degree AV block    GERD (gastroesophageal reflux disease)    GI symptom    had nausea / vomited once / frequent stools / getting better    Hyperlipidemia    Hypertension    Hypothyroidism    Mild carotid artery disease (HCC)    Mitral regurgitation    Mood changes    PAF (paroxysmal atrial fibrillation) (HCC)    Pneumonia    hx of   Tricuspid regurgitation    Tricuspid regurgitation    Past Surgical History:  Procedure Laterality Date   CARDIOVERSION N/A 01/12/2018   Procedure: CARDIOVERSION;  Surgeon: Liza Riggers, MD;  Location: [facility] ENDOSCOPY;  Service: Cardiovascular;  Laterality: N/A;   CARDIOVERSION N/A 01/26/2019   Procedure: CARDIOVERSION;  Surgeon: Hugh Madura, MD;  Location: [facility] ENDOSCOPY;  Service: Cardiovascular;  Laterality: N/A;   CARDIOVERSION N/A 09/30/2019   Procedure: CARDIOVERSION;  Surgeon: Lenise Quince, MD;  Location: [facility] ENDOSCOPY;  Service: Cardiovascular;  Laterality: N/A;   CARDIOVERSION N/A 04/04/2022   Procedure: CARDIOVERSION;  Surgeon: Bridgette Campus, MD;  Location: [facility] ENDOSCOPY;  Service: Cardiovascular;  Laterality: N/A;   COLONOSCOPY  2014   CYSTOSCOPY WITH STENT PLACEMENT Left 06/18/2022   Procedure: CYSTOSCOPY WITH STENT PLACEMENT;  Surgeon: Dustin Gimenez, MD;  Location: [facility] ORS;  Service: Urology;  Laterality: Left;   CYSTOSCOPY WITH STENT PLACEMENT Left 07/21/2022   Procedure: CYSTOSCOPY WITH STENT EXCHANGE;  Surgeon: Dustin Gimenez, MD;  Location: [facility] ORS;  Service: Urology;  Laterality: Left;   FEMUR IM NAIL  Left 10/11/2016   Procedure: INTRAMEDULLARY (IM) NAIL FEMORAL;  Surgeon: Elner Hahn, MD;  Location: [facility] ORS;  Service: Orthopedics;  Laterality: Left;   HAND SURGERY   1973 / 2010 / 2015   right to release tendons   HARDWARE REMOVAL Left 10/05/2017   Procedure: Removal of left femoral nail;  Surgeon: Claiborne Crew, MD;  Location: [facility] ORS;  Service: Orthopedics;  Laterality: Left;  90 mins   HEMATOMA EVACUATION Left 01/19/2018   Procedure: Irrigation and debridement, Evacuation of left total knee hematoma;  Surgeon: Claiborne Crew, MD;  Location: [facility] ORS;  Service:  Orthopedics;  Laterality: Left;   KNEE ARTHROSCOPY  1984 & 2001   twice   NASAL SEPTUM SURGERY     removal Left femoral nail      10/05/17 Dr. Bernard Brick   SHOULDER OPEN ROTATOR CUFF REPAIR  1999 & 2006   Right and left   TEE WITH CARDIOVERSION  01/12/2018   TEE WITHOUT CARDIOVERSION N/A 01/12/2018   Procedure: TRANSESOPHAGEAL ECHOCARDIOGRAM (TEE);  Surgeon: Liza Riggers, MD;  Location: [facility] ENDOSCOPY;  Service: Cardiovascular;  Laterality: N/A;   TONSILLECTOMY     TOTAL KNEE ARTHROPLASTY Right 01/09/2015   Procedure: RIGHT TOTAL KNEE ARTHROPLASTY;  Surgeon: Bevin Bucks, MD;  Location: [facility] ORS;  Service: Orthopedics;  Laterality: Right;   TOTAL KNEE ARTHROPLASTY Left 11/23/2017   Procedure: LEFT TOTAL KNEE ARTHROPLASTY;  Surgeon: Claiborne Crew, MD;  Location: [facility] ORS;  Service: Orthopedics;  Laterality: Left;  90 mins   URETERAL BIOPSY Left 07/21/2022   Procedure: URETERAL BIOPSY;  Surgeon: Dustin Gimenez, MD;  Location: [facility] ORS;  Service: Urology;  Laterality: Left;   URETEROSCOPY Left 07/21/2022   Procedure: URETEROSCOPY;  Surgeon: Dustin Gimenez, MD;  Location: [facility] ORS;  Service: Urology;  Laterality: Left;   Social History: Retired Horticulturist, commercial.  Lives with wife.  At baseline can ambulate around with mild low back discomfort, not limited by dyspnea.    reports that he quit smoking about 55 years ago. His smoking use included cigarettes. He has never used smokeless tobacco. He reports current alcohol use of about 21.0 standard drinks of alcohol per week. He reports that he does not use drugs.  Allergies  Allergen Reactions   Sulfa Antibiotics Other (See Comments)    Unknown/childhood allergy unknown    Family History  Problem Relation Age of Onset   Breast cancer Mother    Colon cancer Neg Hx    Esophageal cancer Neg Hx    Stomach cancer Neg Hx    Rectal cancer Neg Hx     Prior to Admission medications   Medication Sig Start Date End Date Taking? Authorizing Provider   acetaminophen  (TYLENOL ) 500 MG tablet Take 1,500 mg by mouth every 6 (six) hours as needed for moderate pain (pain score 4-6).   Yes [provider]  apixaban  (ELIQUIS ) 5 MG TABS tablet Take 1 tablet (5 mg total) by mouth 2 (two) times daily. 04/20/24  Yes Conte, Tessa N, PA-C  atorvastatin  (LIPITOR) 20 MG tablet Take 1 tablet (20 mg total) by mouth daily. 04/20/24  Yes Bacigalupo, Stan Eans, MD  Cholecalciferol  (VITAMIN D ) 2000 UNITS CAPS Take 2,000 Units by mouth every evening.    Yes [provider]  furosemide  (LASIX ) 40 MG tablet Take 1 tablet (40 mg total) by mouth daily. 06/13/24  Yes Simmons-Robinson, Makiera, MD  levothyroxine  (SYNTHROID ) 100 MCG tablet Take 1 tablet (100 mcg total) by mouth daily. 01/05/24  Yes Bacigalupo,  Stan Eans, MD  losartan  (COZAAR ) 50 MG tablet Take 1 tablet (50 mg total) by mouth daily. 05/16/24  Yes Chandrasekhar, Mahesh A, MD  magnesium  oxide (MAG-OX) 400 (240 Mg) MG tablet TAKE 1 TABLET TWICE DAILY . 05/16/24  Yes Chandrasekhar, Mahesh A, MD  metoprolol  tartrate (LOPRESSOR ) 25 MG tablet Take 1 tablet (25 mg total) by mouth 2 (two) times daily. 08/04/23  Yes Conte, Tessa N, PA-C  Multiple Vitamin (MULTIVITAMIN WITH MINERALS) TABS tablet Take 1 tablet by mouth daily.    Yes [provider]  Multiple Vitamins-Minerals (PRESERVISION AREDS 2) CAPS Take 1 capsule by mouth 2 (two) times daily.    Yes [provider]  pantoprazole  (PROTONIX ) 40 MG tablet Take 1 tablet (40 mg total) by mouth daily. 01/05/24  Yes Bacigalupo, Angela M, MD  potassium chloride  SA (KLOR-CON  M20) 20 MEQ tablet Take 2 tablets (40 mEq total) by mouth daily. 05/26/24  Yes Conte, Tessa N, PA-C  venlafaxine  XR (EFFEXOR -XR) 75 MG 24 hr capsule TAKE 1 CAPSULE EVERY DAY 01/05/24  Yes Bacigalupo, Angela M, MD  amoxicillin (AMOXIL) 500 MG capsule Take 4 capsules by mouth as directed. Before dental appointment 12/11/21   [provider]    Physical Exam: Vitals:    06/17/24 1416 06/17/24 1417  BP: (!) 129/96   Pulse: 83   Resp: 20   Temp: 97.7 F (36.5 C)   TempSrc: Oral   SpO2: 100%   Weight:  88 kg  Height:  5' 11 (1.803 m)   General appearance: Elderly adult male, alert and in no distress, but tired and appears SOB.  Responds appropriately to questions.  Eye contact, dress and hygiene appropriate. HEENT:  The right eyelid is everted, chronic deformity.  Left eye normal, chronic left facial droop. OP moist without lesions, dentition normal, lips normal, normal auditory acuity   Cor: Somewhat tachycardic, I do not appreciate murmurs or rubs, the JVP is elevated to the middle of the neck, there is 3+ edema to the thigh Resp: Appears tachypneic, lung sounds diminished, possibly crackles in the left base, no wheezing Abd:  No TTP or rebound all quadrants.  No masses or organomegaly on my exam, he has mild ascites, not taut.  No stigmata of chronic liver disease Skin: Livedo reticularis cool skin in the lower extremities Neuro:  Speech is slightly dysarthric due to facial droop but at baseline.  Naming is grossly intact, patient's recall, both recent and remote, seem within normal limits.  Muscle tone normal, face symmetric  Psych:  Attention span and concentration are within normal limits.  Affect normal.  appropriate thought content and normal rate of speech, thought process linear           Data Reviewed: Basic metabolic panel shows hyponatremia, normal potassium and creatinine.  Total bilirubin minimally elevated, LFTs normal BNP greater than 4500 CBC shows no leukocytosis or thrombocytopenia INR 1.6 Recent TSH 5.1 Chest x-ray, personally reviewed, shows basilar edema, no effusions ECG, personally reviewed, shows old left bundle, sinus arrhythmia     Assessment and Plan: * Acute CHF (congestive heart failure) (HCC) Presumably this is diastolic, but EF not known, last echo was 3 years ago, EF normal at that time. - Obtain  echocardiogram - Continue IV Lasix  40 mg twice daily - Check troponin and lactic acid - K supplement - Strict I/Os, daily weights, telemetry  - Daily monitoring renal function    Paroxysmal atrial fibrillation (HCC) In sinus rhythm here - Continue home Eliquis ,  metoprolol   Ascites I suspect this is mostly cardiogenic and the INR is from Eliquis , but he reports daily alcohol use. - Obtain liver ultrasound  Hyponatremia Mild, asymptomatic, hypervolemic - Diuretic and trend Na  Obesity (BMI 30-39.9) History of class 1 obesity, lost 50 pounds last year.    Essential (primary) hypertension Blood pressure normal - Continue metoprolol  - Hold losartan  - Lasix   Hypothyroidism TSH minimally elevated 1 week ago - Continue home levothyroxine          Advance Care Planning: FULL CODE  Consults: None  Family Communication: Wife at bedside  Severity of Illness: The appropriate patient status for this patient is INPATIENT. Inpatient status is judged to be reasonable and necessary in order to provide the required intensity of service to ensure the patient's safety. The patient's presenting symptoms, physical exam findings, and initial radiographic and laboratory data in the context of their chronic comorbidities is felt to place them at high risk for further clinical deterioration. Furthermore, it is not anticipated that the patient will be medically stable for discharge from the hospital within 2 midnights of admission.   * I certify that at the point of admission it is my clinical judgment that the patient will require inpatient hospital care spanning beyond 2 midnights from the point of admission due to high intensity of service, high risk for further deterioration and high frequency of surveillance required.*  Author: Ephriam Hashimoto, MD 06/17/2024 5:27 PM  For on call review [email or web address].

## 2024-06-17 NOTE — ED Triage Notes (Addendum)
 Patient presents with SOB and swelling in abdomen and legs. Reports fatigue, loss of appetite and weight gain.   A&O x4 in triage  Chronic arthritis pain. History of A-fib and takes eliquis   Drinks about 3-4 beers daily

## 2024-06-17 NOTE — Assessment & Plan Note (Signed)
 In sinus rhythm here - Continue home Eliquis , metoprolol 

## 2024-06-17 NOTE — Assessment & Plan Note (Signed)
 Blood pressure normal - Continue metoprolol  - Hold losartan  - Lasix 

## 2024-06-17 NOTE — Assessment & Plan Note (Signed)
 Presumably this is diastolic, but EF not known, last echo was 3 years ago, EF normal at that time. - Obtain echocardiogram - Continue IV Lasix , increase to 80 twice daily - Check troponin and lactic acid - K supplement - Strict I/Os, daily weights, telemetry  - Daily monitoring renal function

## 2024-06-17 NOTE — Assessment & Plan Note (Signed)
 Mild, asymptomatic, hypervolemic - Diuretic and trend Na

## 2024-06-17 NOTE — Assessment & Plan Note (Signed)
 I suspect this is mostly cardiogenic and the INR is from Eliquis , but he reports daily alcohol use. - Obtain liver ultrasound

## 2024-06-17 NOTE — ED Notes (Signed)
 Pt being transported to rm [id number] at this time via [facility].

## 2024-06-17 NOTE — Hospital Course (Addendum)
 Mr. Crull is a 78 y.o. M with HTN, obesity, pAF on Eliquis , hypothyroidism who presented with SOB, swelling, fatigue.    Patient lost about 50 lbs over the last year, but in the last month, has gained 20 lbs, and during the same time, has noticed swelling in his abdomen and legs and PND.  Finally, he went to PCP's office, we here he was noted to have BNP >4000, and started on Lasix .    He trialed this at home and had marginal improvement, but was still too SOB to walk across the room so he came to the ER.  There, he was found to have Na 129, normal Cr, BNP >4500.  INR 1.6.  CXR basilar edema. 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   Started on IV Lasix , hospitalists admitted.

## 2024-06-17 NOTE — Assessment & Plan Note (Signed)
 History of class 1 obesity, lost 50 pounds last year.

## 2024-06-17 NOTE — ED Provider Notes (Signed)
 [facility] Provider Note    Event Date/Time   First MD Initiated Contact with Patient 06/17/24 1514     (approximate)   History   Shortness of Breath and Leg Swelling   HPI  Eugene White is a 78 y.o. male past medical history significant for paroxysmal atrial fibrillation on Eliquis , CAD, spinal stenosis, alcohol use disorder, hypothyroidism, presents to the emergency department with shortness of breath.  Patient states that he has gained a significant amount of weight approximately 20 pounds over the past 20 days.  States that he is retaining weight in his arms, abdomen and his legs.  Denies prior history of heart failure.  States that he was started on Lasix  on Monday.  Called and told by his primary care physician that he needs to come into the hospital given concern that he might need IV Lasix .  Endorses generalized fatigue and weakness.  Sleeping on 1-2 pillows at night.  Drinks 3-4 beers on a daily basis and this is decreased from prior.  Does state that he has had a history of alcohol withdrawal.  Denies any history of cirrhosis.  Denies any symptoms of a GI bleed.  Denies history of PE or DVT   On review of outside records patient had an echocardiogram in 2022 on my evaluation since last echocardiogram in the system.  Appears to have been normal EF of 55 to 60%.  Mild valvular disease.  Unable to determine if there was any diastolic dysfunction on that echocardiogram.     Physical Exam   Triage Vital Signs: ED Triage Vitals  Encounter Vitals Group     BP 06/17/24 1416 (!) 129/96     Girls Systolic BP Percentile --      Girls Diastolic BP Percentile --      Boys Systolic BP Percentile --      Boys Diastolic BP Percentile --      Pulse Rate 06/17/24 1416 83     Resp 06/17/24 1416 20     Temp 06/17/24 1416 97.7 F (36.5 C)     Temp Source 06/17/24 1416 Oral     SpO2 06/17/24 1416 100 %     Weight 06/17/24 1417 194 lb (88 kg)     Height  06/17/24 1417 5' 11 (1.803 m)     Head Circumference --      Peak Flow --      Pain Score 06/17/24 1416 3     Pain Loc --      Pain Education --      Exclude from Growth Chart --     Most recent vital signs: Vitals:   06/17/24 1416  BP: (!) 129/96  Pulse: 83  Resp: 20  Temp: 97.7 F (36.5 C)  SpO2: 100%    Physical Exam Constitutional:      Appearance: He is well-developed.  HENT:     Head: Atraumatic.   Eyes:     Conjunctiva/sclera: Conjunctivae normal.    Cardiovascular:     Rate and Rhythm: Regular rhythm.  Pulmonary:     Effort: Accessory muscle usage present. No respiratory distress.  Abdominal:     Palpations: Abdomen is soft.     Tenderness: There is no abdominal tenderness.     Comments: No rebound or guarding.  No obvious fluid wave.   Musculoskeletal:     Cervical back: Normal range of motion.     Right lower leg: Edema present.  Left lower leg: Edema present.     Comments: 2+ pitting edema to bilateral lower extremities.   Skin:    General: Skin is warm.     Capillary Refill: Capillary refill takes less than 2 seconds.   Neurological:     General: No focal deficit present.     Mental Status: He is alert. Mental status is at baseline.     IMPRESSION / MDM / ASSESSMENT AND PLAN / ED COURSE  I reviewed the triage vital signs and the nursing notes.  Differential diagnosis including new onset heart failure, ACS, anemia, GI bleed, cirrhosis  EKG  I, Viviano Ground, the attending physician, personally viewed and interpreted this ECG.  EKG showed normal sinus rhythm.  First-degree AV block which has been present previously.  Underlying left bundle branch block.  Negative Sgarbossa's criteria.  No significant change compared to prior EKG.  No findings of acute ischemia or dysrhythmia.  No tachycardic or bradycardic dysrhythmias while on cardiac telemetry.  RADIOLOGY I independently reviewed imaging, my interpretation of imaging: Mild  cardiomegaly and pulmonary edema.  No focal findings consistent with pneumonia.  No widened mediastinum.  No pneumothorax.  LABS (all labs ordered are listed, but only abnormal results are displayed) Labs interpreted as -    Labs Reviewed  BASIC METABOLIC PANEL WITH GFR - Abnormal; Notable for the following components:      Result Value   Sodium 129 (*)    CO2 20 (*)    Glucose, Bld 123 (*)    All other components within normal limits  CBC - Abnormal; Notable for the following components:   RBC 3.49 (*)    Hemoglobin 12.2 (*)    HCT 34.5 (*)    MCH 35.0 (*)    All other components within normal limits  BRAIN NATRIURETIC PEPTIDE - Abnormal; Notable for the following components:   B Natriuretic Peptide >4,500.0 (*)    All other components within normal limits  PROTIME-INR - Abnormal; Notable for the following components:   Prothrombin Time 18.8 (*)    INR 1.6 (*)    All other components within normal limits  HEPATIC FUNCTION PANEL     MDM  Labs with hyponatremia of 129.  Hemoglobin appears to be stable.  No significant leukocytosis.  BNP is greater than 4500 and previously has been in the 200s.  INR mildly elevated at 1.6 but does have normal platelets.  Hyponatremia is likely in the setting of volume overload.  Does have edema on exam.  BNP significantly elevated.  Chest x-ray without significant pulmonary edema.  Adding on LFTs to initial BMP.  No confusion or altered mental status have a low suspicion for hepatic encephalopathy.  Patient was started on Lasix  on Monday.  Given a dose of IV Lasix  in the emergency department.  Concern for new onset heart failure in the setting of alcohol use disorder versus progression of cirrhosis with volume overload.  Have a low suspicion for SBP, abdomen is nontender at this time.  Low suspicion for bowel obstruction.  Does have anemia but no signs or symptoms of a GI bleed.  Consulted hospitalist for admission.     PROCEDURES:  Critical  Care performed: No  Procedures  Patient's presentation is most consistent with acute presentation with potential threat to life or bodily function.   MEDICATIONS ORDERED IN ED: Medications  furosemide  (LASIX ) injection 40 mg (40 mg Intravenous Given 06/17/24 1601)    FINAL CLINICAL IMPRESSION(S) / ED DIAGNOSES  Final diagnoses:  Peripheral edema  Shortness of breath  Generalized abdominal pain  Alcohol use     Rx / DC Orders   ED Discharge Orders     None        Note:  This document was prepared using Dragon voice recognition software and may include unintentional dictation errors.   Viviano Ground, MD 06/17/24 [phone]

## 2024-06-17 NOTE — Assessment & Plan Note (Signed)
 TSH minimally elevated 1 week ago - Continue home levothyroxine 

## 2024-06-17 NOTE — ED Notes (Signed)
 Called 2A, spoke with Starling Eck, pt on their way

## 2024-06-18 ENCOUNTER — Inpatient Hospital Stay: Payer: Medicare (Managed Care)

## 2024-06-18 ENCOUNTER — Inpatient Hospital Stay (HOSPITAL_COMMUNITY)
Admit: 2024-06-18 | Discharge: 2024-06-18 | Disposition: A | Discharge status: Home / Self Care | Payer: Medicare (Managed Care) | Attending: Family Medicine | Admitting: Family Medicine

## 2024-06-18 DIAGNOSIS — R0602 Shortness of breath: Secondary | ICD-10-CM

## 2024-06-18 DIAGNOSIS — I447 Left bundle-branch block, unspecified: Secondary | ICD-10-CM

## 2024-06-18 DIAGNOSIS — I5031 Acute diastolic (congestive) heart failure: Secondary | ICD-10-CM | POA: Diagnosis not present

## 2024-06-18 DIAGNOSIS — F1090 Alcohol use, unspecified, uncomplicated: Secondary | ICD-10-CM

## 2024-06-18 DIAGNOSIS — E871 Hypo-osmolality and hyponatremia: Secondary | ICD-10-CM | POA: Diagnosis not present

## 2024-06-18 DIAGNOSIS — I509 Heart failure, unspecified: Secondary | ICD-10-CM | POA: Diagnosis not present

## 2024-06-18 DIAGNOSIS — R600 Localized edema: Principal | ICD-10-CM

## 2024-06-18 DIAGNOSIS — I480 Paroxysmal atrial fibrillation: Secondary | ICD-10-CM | POA: Diagnosis not present

## 2024-06-18 DIAGNOSIS — I10 Essential (primary) hypertension: Secondary | ICD-10-CM | POA: Diagnosis not present

## 2024-06-18 LAB — CBC
HCT: 34 % — ABNORMAL LOW (ref 39.0–52.0)
Hemoglobin: 11.7 g/dL — ABNORMAL LOW (ref 13.0–17.0)
MCH: 34.5 pg — ABNORMAL HIGH (ref 26.0–34.0)
MCHC: 34.4 g/dL (ref 30.0–36.0)
MCV: 100.3 fL — ABNORMAL HIGH (ref 80.0–100.0)
Platelets: 197 10*3/uL (ref 150–400)
RBC: 3.39 MIL/uL — ABNORMAL LOW (ref 4.22–5.81)
RDW: 15 % (ref 11.5–15.5)
WBC: 5.1 10*3/uL (ref 4.0–10.5)
nRBC: 0 % (ref 0.0–0.2)

## 2024-06-18 LAB — PSA: Prostatic Specific Antigen: 4.59 ng/mL — ABNORMAL HIGH (ref 0.00–4.00)

## 2024-06-18 LAB — BASIC METABOLIC PANEL WITH GFR
Anion gap: 11 (ref 5–15)
BUN: 24 mg/dL — ABNORMAL HIGH (ref 8–23)
CO2: 20 mmol/L — ABNORMAL LOW (ref 22–32)
Calcium: 8.8 mg/dL — ABNORMAL LOW (ref 8.9–10.3)
Chloride: 98 mmol/L (ref 98–111)
Creatinine, Ser: 1.25 mg/dL — ABNORMAL HIGH (ref 0.61–1.24)
GFR, Estimated: 59 mL/min — ABNORMAL LOW (ref >60)
Glucose, Bld: 109 mg/dL — ABNORMAL HIGH (ref 70–99)
Potassium: 4 mmol/L (ref 3.5–5.1)
Sodium: 129 mmol/L — ABNORMAL LOW (ref 135–145)

## 2024-06-18 LAB — LACTIC ACID, PLASMA: Lactic Acid, Venous: 1.7 mmol/L (ref 0.5–1.9)

## 2024-06-18 MED ORDER — TAMSULOSIN HCL 0.4 MG PO CAPS
0.4000 mg | ORAL_CAPSULE | Freq: Every day | ORAL | Status: DC
  Administered 2024-06-18 – 2024-06-23 (×6): 0.4 mg via ORAL
  Filled 2024-06-18 (×6): qty 1

## 2024-06-18 MED ORDER — CHLORHEXIDINE GLUCONATE CLOTH 2 % EX PADS
6.0000 | MEDICATED_PAD | Freq: Every day | CUTANEOUS | Status: DC
  Administered 2024-06-18 – 2024-06-23 (×6): 6 via TOPICAL

## 2024-06-18 MED ORDER — APIXABAN 5 MG PO TABS
5.0000 mg | ORAL_TABLET | Freq: Two times a day (BID) | ORAL | Status: DC
  Administered 2024-06-18 – 2024-06-19 (×4): 5 mg via ORAL
  Filled 2024-06-18 (×4): qty 1

## 2024-06-18 MED ORDER — ACETAMINOPHEN 650 MG RE SUPP: 650.0000 mg | Freq: Four times a day (QID) | RECTAL | Status: DC | PRN

## 2024-06-18 MED ORDER — ATORVASTATIN CALCIUM 20 MG PO TABS
20.0000 mg | ORAL_TABLET | Freq: Every day | ORAL | Status: DC
  Administered 2024-06-18 – 2024-06-21 (×4): 20 mg via ORAL
  Filled 2024-06-18 (×5): qty 1

## 2024-06-18 MED ORDER — ONDANSETRON HCL 4 MG PO TABS: 4.0000 mg | ORAL_TABLET | Freq: Four times a day (QID) | ORAL | Status: DC | PRN

## 2024-06-18 MED ORDER — ACETAMINOPHEN 325 MG PO TABS
650.0000 mg | ORAL_TABLET | Freq: Four times a day (QID) | ORAL | Status: DC | PRN
  Filled 2024-06-18: qty 2

## 2024-06-18 MED ORDER — LEVOTHYROXINE SODIUM 100 MCG PO TABS
100.0000 ug | ORAL_TABLET | Freq: Every day | ORAL | Status: DC
  Administered 2024-06-18 – 2024-06-24 (×7): 100 ug via ORAL
  Filled 2024-06-18 (×7): qty 1

## 2024-06-18 MED ORDER — ZOLPIDEM TARTRATE 5 MG PO TABS
5.0000 mg | ORAL_TABLET | Freq: Every evening | ORAL | Status: DC | PRN
  Administered 2024-06-18 – 2024-06-23 (×2): 5 mg via ORAL
  Filled 2024-06-18 (×2): qty 1

## 2024-06-18 MED ORDER — PANTOPRAZOLE SODIUM 40 MG PO TBEC
40.0000 mg | DELAYED_RELEASE_TABLET | Freq: Every day | ORAL | Status: DC
  Administered 2024-06-18 – 2024-06-24 (×7): 40 mg via ORAL
  Filled 2024-06-18 (×9): qty 1

## 2024-06-18 MED ORDER — METOPROLOL TARTRATE 25 MG PO TABS
25.0000 mg | ORAL_TABLET | Freq: Two times a day (BID) | ORAL | Status: DC
  Administered 2024-06-18 – 2024-06-19 (×4): 25 mg via ORAL
  Filled 2024-06-18 (×4): qty 1

## 2024-06-18 MED ORDER — VENLAFAXINE HCL ER 75 MG PO CP24
75.0000 mg | ORAL_CAPSULE | Freq: Every day | ORAL | Status: DC
  Administered 2024-06-18 – 2024-06-24 (×7): 75 mg via ORAL
  Filled 2024-06-18 (×7): qty 1

## 2024-06-18 MED ORDER — ONDANSETRON HCL 4 MG/2ML IJ SOLN
4.0000 mg | Freq: Four times a day (QID) | INTRAMUSCULAR | Status: DC | PRN
Start: 2024-06-18 — End: 2024-06-24

## 2024-06-18 MED ORDER — POTASSIUM CHLORIDE CRYS ER 20 MEQ PO TBCR
20.0000 meq | EXTENDED_RELEASE_TABLET | Freq: Two times a day (BID) | ORAL | Status: DC
  Administered 2024-06-18 – 2024-06-19 (×4): 20 meq via ORAL
  Filled 2024-06-18 (×4): qty 1

## 2024-06-18 NOTE — Plan of Care (Signed)

## 2024-06-18 NOTE — Progress Notes (Signed)
 PROGRESS NOTE  Eugene White  FMW:[id number] DOB: 07-26-1946 DOA: 06/17/2024 PCP: Myrla Jon HERO, MD  Consultants  Brief Narrative: 78 y.o. male with a PMH significant for HTN, obesity, pAF on Eliquis , hypothyroidism who presented with SOB, swelling, fatigue.  Patient lost about 50 lbs over the last year, but in the last month, has gained 20 lbs, and during the same time, has noticed swelling in his abdomen and legs.  Finally, he went to PCP's office, we here he was noted to have BNP >4000, and started on Lasix .  He trialed this at home and had marginal improvement, but was still too SOB to walk across the room so he came to the ER.  Started on IV Lasix  and [facility] called to admit.   Assessment & Plan: Acute CHF (congestive heart failure) (HCC) - Unclear etiology.  Hx/o A fib and cardioversion x 4, sinus rhythm.  Possibly secondary to alcohol use.  Cardiology consulted.   - Last echo was 3 years ago, EF normal at that time. - Echo pending. - on 40 mg lasix  IV BID -- urine output not great, see BPH below - Troponin minimally elevated, likely strain - Strict I/Os, daily weights, telemetry  - Daily monitoring renal function-->uptrended from yesterday   Paroxysmal atrial fibrillation (HCC) In sinus rhythm here - Continue home Eliquis , metoprolol   BPH: - poor urine output thus far.  Abd US  showed BL hydroureteronephrosis and bladder distension - Pt w/ reported hx/o of BPH.  PSA pending - Nurse unable to pass Foley.   - Urology consulted for further recommendations, appreciate eval  Elevated Cr: - not quite to level of AKI.   - likely post-nephrotic/obstructive.  Trend - appreciate urology input, likely will improve s/p placement of foley.  Continue lasix .    Ascites - likely cardiogenic.  But he reports daily alcohol use. - Obtain liver ultrasound-->numerous liver cysts noted without frank fibrosis/cirrhosis.     Hyponatremia - Mild, asymptomatic, hypervolemic - Diuretic and  trend Na  Macrocytic anemia: - reports daily EtOH usage - trend Hgb.  No bleeding   Obesity (BMI 30-39.9) History of class 1 obesity, lost 50 pounds last year, gained 20 since start of June.     Essential (primary) hypertension Blood pressure normal - Continue metoprolol  - Hold losartan  - Lasix    Hypothyroidism TSH minimally elevated 1 week ago - Continue home levothyroxine   DVT prophylaxis:   apixaban  (ELIQUIS ) tablet 5 mg  Code Status:   Code Status: Full Code Family Communication: spoke with daughter by phone Level of care: Telemetry Cardiac Status is: Inpatient Dispo:  pending further diuresis and cardiology/urology recommendations.   Consults called: Urology, cardiology   Subjective: Patient awake and alert, still with SOB with minimal exertion.  Poor appetite.  Difficulty voiding.  No N/V/abd pain  Objective: Vitals:   06/18/24 0335 06/18/24 0500 06/18/24 0908 06/18/24 1148  BP: (!) 117/91  (!) 138/98 (!) 130/96  Pulse: 73  89 87  Resp: 19  12   Temp: (!) 97.3 F (36.3 C)  97.9 F (36.6 C) 97.9 F (36.6 C)  TempSrc: Oral  Oral   SpO2: 100%  94% 100%  Weight:  86 kg    Height:        Intake/Output Summary (Last 24 hours) at 06/18/2024 1238 Last data filed at 06/18/2024 1200 Gross per 24 hour  Intake 240 ml  Output 1125 ml  Net -885 ml   Filed Weights   06/17/24 1417 06/18/24 0009 06/18/24 0500  Weight: 88 kg 86.1 kg 86 kg   Body mass index is 26.44 kg/m.  Gen: 78 y.o. male in no apparent distress.  Nontoxic HEENT:   Right eyelid everted and with some Right sided facial droop, both being chronic,  Pulm: Non-labored breathing.  Clear to auscultation bilaterally.  CV: Regular rate and rhythm. JVP to mid-neck GI: Abdomen soft, non-tender, non-distended.  Trace to minimal ascites noted Ext: Warm, no deformities, +3 BL LE edema to mid thighs Skin: No rashes, lesions Neuro: Alert and oriented x 4. Muscle tone normal, moving all 4 limbs  symmetrically Psych: Calm  Judgement and insight appear normal. Mood & affect appropriate.     I have personally reviewed the following labs and images: CBC: Recent Labs  Lab 06/17/24 1421 06/18/24 0053  WBC 4.8 5.1  HGB 12.2* 11.7*  HCT 34.5* 34.0*  MCV 98.9 100.3*  PLT 212 197   BMP &GFR Recent Labs  Lab 06/17/24 1421 06/18/24 0053  NA 129* 129*  K 4.2 4.0  CL 98 98  CO2 20* 20*  GLUCOSE 123* 109*  BUN 21 24*  CREATININE 1.18 1.25*  CALCIUM  8.9 8.8*   Estimated Creatinine Clearance: 51.9 mL/min (A) (by C-G formula based on SCr of 1.25 mg/dL (H)). Liver & Pancreas: Recent Labs  Lab 06/17/24 1421  AST 30  ALT 18  ALKPHOS 58  BILITOT 1.3*  PROT 5.8*  ALBUMIN 3.7   No results for input(s): LIPASE, AMYLASE in the last 168 hours. No results for input(s): AMMONIA in the last 168 hours. Diabetic: No results for input(s): HGBA1C in the last 72 hours. No results for input(s): GLUCAP in the last 168 hours. Cardiac Enzymes: No results for input(s): CKTOTAL, CKMB, CKMBINDEX, TROPONINI in the last 168 hours. No results for input(s): PROBNP in the last 8760 hours. Coagulation Profile: Recent Labs  Lab 06/17/24 1421  INR 1.6*   Thyroid  Function Tests: No results for input(s): TSH, T4TOTAL, FREET4, T3FREE, THYROIDAB in the last 72 hours. Lipid Profile: No results for input(s): CHOL, HDL, LDLCALC, TRIG, CHOLHDL, LDLDIRECT in the last 72 hours. Anemia Panel: No results for input(s): VITAMINB12, FOLATE, FERRITIN, TIBC, IRON, RETICCTPCT in the last 72 hours. Urine analysis:    Component Value Date/Time   COLORURINE YELLOW (A) 06/18/2022 1655   APPEARANCEUR Hazy (A) 09/17/2022 1555   LABSPEC 1.018 06/18/2022 1655   PHURINE 6.0 06/18/2022 1655   GLUCOSEU Negative 09/17/2022 1555   HGBUR NEGATIVE 06/18/2022 1655   BILIRUBINUR Negative 09/17/2022 1555   KETONESUR 5 (A) 06/18/2022 1655   PROTEINUR 2+ (A) 09/17/2022  1555   PROTEINUR 100 (A) 06/18/2022 1655   UROBILINOGEN 0.2 06/26/2022 1608   UROBILINOGEN 0.2 01/03/2015 1433   NITRITE Negative 09/17/2022 1555   NITRITE NEGATIVE 06/18/2022 1655   LEUKOCYTESUR Trace (A) 09/17/2022 1555   LEUKOCYTESUR NEGATIVE 06/18/2022 1655   Sepsis Labs: Invalid input(s): PROCALCITONIN, LACTICIDVEN  Microbiology: No results found for this or any previous visit (from the past 240 hours).  Radiology Studies: US  Abdomen Complete Result Date: 06/18/2024 CLINICAL DATA:  Ascites. EXAM: ABDOMEN ULTRASOUND COMPLETE COMPARISON:  CT with contrast 07/03/2022. FINDINGS: Gallbladder: Gallbladder is distended. There is a small lobular area dependently measured by the sonographer 4 mm which is not shadow. This could be mass effect from adjacent structures versus a small polyp. No clear shadowing stones, wall thickening. No reported Murphy's sign. Common bile duct: Diameter: 7 mm. Liver: Multiple anechoic foci identified in the liver consistent with numerous cysts. Example left hepatic  lobe measures up to 3.5 cm. Smaller foci measure 14 mm. There is larger right-sided focus peripherally measuring 4.9 cm. Portal vein is patent on color Doppler imaging with normal direction of blood flow towards the liver. IVC: No abnormality visualized. Pancreas: Obscured by overlapping bowel gas and soft tissue. Spleen: Size and appearance within normal limits. Poorly seen with overlapping bowel gas and soft tissue. Right Kidney: Length: 11 cm.  Severe collecting system dilatation. Left Kidney: Length: 10.4 cm. Severe collecting system dilatation. This was seen on the CT scan of 07/03/2022. Abdominal aorta: No aneurysm visualized. Other findings: Bladder has a lobular contour which are brachial a shins and is distended. Bladder volume calculated at 625 cc. Postvoid bladder residual of 471 cc. IMPRESSION: Bilateral severe collecting system dilatation. Left-sided dilatation was seen on the remote CT scan.  The right however is new. Please correlate for history otherwise recommend further evaluation such as CT scan. Bladder wall thickening which are back elation. Large postvoid bladder residual. Numerous hepatic cysts. Question small gallbladder polyp.  No biliary ductal dilatation. Limited visualization of several structures with overlapping bowel gas and soft tissue. Electronically Signed   By: Ranell Bring M.D.   On: 06/18/2024 10:38   DG Chest 2 View Result Date: 06/17/2024 CLINICAL DATA:  Shortness of breath EXAM: CHEST - 2 VIEW COMPARISON:  06/10/2024 FINDINGS: Mild cardiomegaly with trace pleural effusions. Slight central congestion. No consolidation. Aortic atherosclerosis. No pneumothorax. Multiple mild wedging deformities of mid to lower thoracic vertebra. IMPRESSION: Mild cardiomegaly with trace pleural effusions and slight central congestion. Electronically Signed   By: Luke Bun M.D.   On: 06/17/2024 17:30    Scheduled Meds:  apixaban   5 mg Oral BID   atorvastatin   20 mg Oral Daily   Chlorhexidine  Gluconate Cloth  6 each Topical Daily   furosemide   40 mg Intravenous BID   levothyroxine   100 mcg Oral Q0600   metoprolol  tartrate  25 mg Oral BID   pantoprazole   40 mg Oral Daily   potassium chloride   20 mEq Oral BID   venlafaxine  XR  75 mg Oral Q breakfast   Continuous Infusions:   LOS: 1 day   35 minutes with more than 50% spent in reviewing records, counseling patient/family and coordinating care.  Reyes VEAR Gaw, MD [facility] [email or web address] 06/18/2024, 12:38 PM

## 2024-06-18 NOTE — Progress Notes (Signed)
*  PRELIMINARY RESULTS* Echocardiogram 2D Echocardiogram has been performed.  Eugene White 06/18/2024, 4:38 PM

## 2024-06-18 NOTE — Consult Note (Signed)
 Consultation: Incomplete bladder emptying, bilateral hydronephrosis Requested by: Dr. Reyes Gaw  History of Present Illness: Eugene White is a 78 year old male with a history of BPH.  He is followed by Dr. Penne.  He was admitted for CHF exacerbation and is being diuresed.  An abdominal ultrasound bilateral hydronephrosis down to a distended bladder.  Patient endorses progressive difficulty voiding over the past month and has not felt empty.  Also he noted 24 pound weight gain.  Foley catheter was ordered and nurses had difficulty placing it.  His creatinine was 1.25 with a baseline around 1.  He had mild BPH on CT in 2023 with a 40 g prostate.  He is not on any prostate medications.  He has a history of left hydronephrosis and had a stent and then a follow-up diagnostic ureteroscopy that was benign in 2023.  Hydronephrosis resolved.  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 ENDOSCOPY;  Service: Cardiovascular;  Laterality: N/A;   CARDIOVERSION N/A 01/26/2019   Procedure: CARDIOVERSION;   Surgeon: Jeffrie Oneil BROCKS, MD;  Location: [facility] ENDOSCOPY;  Service: Cardiovascular;  Laterality: N/A;   CARDIOVERSION N/A 09/30/2019   Procedure: CARDIOVERSION;  Surgeon: Pietro Redell RAMAN, MD;  Location: [facility] ENDOSCOPY;  Service: Cardiovascular;  Laterality: N/A;   CARDIOVERSION N/A 04/04/2022   Procedure: CARDIOVERSION;  Surgeon: Alvan Ronal BRAVO, MD;  Location: [facility] ENDOSCOPY;  Service: Cardiovascular;  Laterality: N/A;   COLONOSCOPY  2014   CYSTOSCOPY WITH STENT PLACEMENT Left 06/18/2022   Procedure: CYSTOSCOPY WITH STENT PLACEMENT;  Surgeon: Penne Knee, MD;  Location: [facility] ORS;  Service: Urology;  Laterality: Left;   CYSTOSCOPY WITH STENT PLACEMENT Left 07/21/2022   Procedure: CYSTOSCOPY WITH STENT EXCHANGE;  Surgeon: Penne Knee, MD;  Location: [facility] ORS;  Service: Urology;  Laterality: Left;   FEMUR IM NAIL Left 10/11/2016   Procedure: INTRAMEDULLARY (IM) NAIL FEMORAL;  Surgeon: Norleen JINNY Maltos, MD;  Location: [facility] ORS;  Service: Orthopedics;  Laterality: Left;   HAND SURGERY   1973 / 2010 / 2015   right to release tendons   HARDWARE REMOVAL Left 10/05/2017   Procedure: Removal of left femoral nail;  Surgeon: Ernie Cough, MD;  Location: [facility] ORS;  Service: Orthopedics;  Laterality: Left;  90 mins   HEMATOMA EVACUATION Left 01/19/2018   Procedure: Irrigation and debridement, Evacuation of left total knee hematoma;  Surgeon: Ernie Cough, MD;  Location: [facility] ORS;  Service: Orthopedics;  Laterality: Left;  KNEE ARTHROSCOPY  1984 & 2001   twice   NASAL SEPTUM SURGERY     removal Left femoral nail      10/05/17 Dr. ERNIE   SHOULDER OPEN ROTATOR CUFF REPAIR  1999 & 2006   Right and left   TEE WITH CARDIOVERSION  01/12/2018   TEE WITHOUT CARDIOVERSION N/A 01/12/2018   Procedure: TRANSESOPHAGEAL ECHOCARDIOGRAM (TEE);  Surgeon: Maranda Leim DEL, MD;  Location: [facility] ENDOSCOPY;  Service: Cardiovascular;  Laterality: N/A;   TONSILLECTOMY     TOTAL KNEE ARTHROPLASTY Right 01/09/2015   Procedure: RIGHT TOTAL  KNEE ARTHROPLASTY;  Surgeon: Donnice JONETTA ERNIE, MD;  Location: [facility] ORS;  Service: Orthopedics;  Laterality: Right;   TOTAL KNEE ARTHROPLASTY Left 11/23/2017   Procedure: LEFT TOTAL KNEE ARTHROPLASTY;  Surgeon: ERNIE Donnice, MD;  Location: [facility] ORS;  Service: Orthopedics;  Laterality: Left;  90 mins   URETERAL BIOPSY Left 07/21/2022   Procedure: URETERAL BIOPSY;  Surgeon: Penne Knee, MD;  Location: [facility] ORS;  Service: Urology;  Laterality: Left;   URETEROSCOPY Left 07/21/2022   Procedure: URETEROSCOPY;  Surgeon: Penne Knee, MD;  Location: [facility] ORS;  Service: Urology;  Laterality: Left;    Home Medications:  Medications Prior to Admission  Medication Sig Dispense Refill Last Dose/Taking   acetaminophen  (TYLENOL ) 500 MG tablet Take 1,500 mg by mouth every 6 (six) hours as needed for moderate pain (pain score 4-6).   Taking As Needed   apixaban  (ELIQUIS ) 5 MG TABS tablet Take 1 tablet (5 mg total) by mouth 2 (two) times daily. 180 tablet 1 06/17/2024 at 10:30 AM   atorvastatin  (LIPITOR) 20 MG tablet Take 1 tablet (20 mg total) by mouth daily. 90 tablet 1 06/17/2024   Cholecalciferol  (VITAMIN D ) 2000 UNITS CAPS Take 2,000 Units by mouth every evening.    06/17/2024   furosemide  (LASIX ) 40 MG tablet Take 1 tablet (40 mg total) by mouth daily. 30 tablet 3 06/17/2024   levothyroxine  (SYNTHROID ) 100 MCG tablet Take 1 tablet (100 mcg total) by mouth daily. 90 tablet 2 06/17/2024   losartan  (COZAAR ) 50 MG tablet Take 1 tablet (50 mg total) by mouth daily. 90 tablet 3 06/17/2024   magnesium  oxide (MAG-OX) 400 (240 Mg) MG tablet TAKE 1 TABLET TWICE DAILY . 180 tablet 3 06/16/2024   metoprolol  tartrate (LOPRESSOR ) 25 MG tablet Take 1 tablet (25 mg total) by mouth 2 (two) times daily. 180 tablet 2 06/17/2024   Multiple Vitamin (MULTIVITAMIN WITH MINERALS) TABS tablet Take 1 tablet by mouth daily.    06/17/2024   Multiple Vitamins-Minerals (PRESERVISION AREDS 2) CAPS Take 1 capsule by mouth 2 (two) times daily.     06/17/2024   pantoprazole  (PROTONIX ) 40 MG tablet Take 1 tablet (40 mg total) by mouth daily. 90 tablet 3 06/17/2024   potassium chloride  SA (KLOR-CON  M20) 20 MEQ tablet Take 2 tablets (40 mEq total) by mouth daily. 180 tablet 3 06/17/2024   venlafaxine  XR (EFFEXOR -XR) 75 MG 24 hr capsule TAKE 1 CAPSULE EVERY DAY 90 capsule 3 06/17/2024   amoxicillin (AMOXIL) 500 MG capsule Take 4 capsules by mouth as directed. Before dental appointment      Allergies:  Allergies  Allergen Reactions   Sulfa Antibiotics Other (See Comments)    Unknown/childhood allergy unknown    Family History  Problem Relation Age of Onset   Breast cancer Mother    Colon cancer Neg Hx    Esophageal cancer Neg Hx    Stomach cancer Neg Hx  Rectal cancer Neg Hx    Social History:  reports that he quit smoking about 55 years ago. His smoking use included cigarettes. He has never used smokeless tobacco. He reports current alcohol use of about 21.0 standard drinks of alcohol per week. He reports that he does not use drugs.  ROS: A complete review of systems was performed.  All systems are negative except for pertinent findings as noted. Review of Systems  All other systems reviewed and are negative.    Physical Exam:  Vital signs in last 24 hours: Temp:  [97.3 F (36.3 C)-97.9 F (36.6 C)] 97.9 F (36.6 C) (06/21 1148) Pulse Rate:  [73-112] 87 (06/21 1148) Resp:  [12-26] 12 (06/21 0908) BP: (117-140)/(86-105) 130/96 (06/21 1148) SpO2:  [94 %-100 %] 100 % (06/21 1148) Weight:  [86 kg-86.1 kg] 86 kg (06/21 0500) General:  Alert and oriented, No acute distress HEENT: Normocephalic, atraumatic Cardiovascular: Regular rate and rhythm Lungs: Regular rate and effort Abdomen: Soft, nontender, nondistended, no abdominal masses Back: No CVA tenderness Extremities: No edema Neurologic: Grossly intact GU: Penis circumcised and without mass or lesion.  The glans and meatus appeared normal.  Scrotum appeared  normal.  Procedure: He was prepped and draped in the usual sterile fashion.  An 52 French coud catheter was passed without resistance.  Balloon inflated and seated at the bladder neck.  800 cc drained.  Laboratory Data:  Results for orders placed or performed during the hospital encounter of 06/17/24 (from the past 24 hours)  Lactic acid, plasma     Status: None   Collection Time: 06/17/24  5:11 PM  Result Value Ref Range   Lactic Acid, Venous 1.8 0.5 - 1.9 mmol/L  Lactic acid, plasma     Status: None   Collection Time: 06/18/24 12:53 AM  Result Value Ref Range   Lactic Acid, Venous 1.7 0.5 - 1.9 mmol/L  Basic metabolic panel     Status: Abnormal   Collection Time: 06/18/24 12:53 AM  Result Value Ref Range   Sodium 129 (L) 135 - 145 mmol/L   Potassium 4.0 3.5 - 5.1 mmol/L   Chloride 98 98 - 111 mmol/L   CO2 20 (L) 22 - 32 mmol/L   Glucose, Bld 109 (H) 70 - 99 mg/dL   BUN 24 (H) 8 - 23 mg/dL   Creatinine, Ser 8.74 (H) 0.61 - 1.24 mg/dL   Calcium  8.8 (L) 8.9 - 10.3 mg/dL   GFR, Estimated 59 (L) >60 mL/min   Anion gap 11 5 - 15  CBC     Status: Abnormal   Collection Time: 06/18/24 12:53 AM  Result Value Ref Range   WBC 5.1 4.0 - 10.5 K/uL   RBC 3.39 (L) 4.22 - 5.81 MIL/uL   Hemoglobin 11.7 (L) 13.0 - 17.0 g/dL   HCT 65.9 (L) 60.9 - 47.9 %   MCV 100.3 (H) 80.0 - 100.0 fL   MCH 34.5 (H) 26.0 - 34.0 pg   MCHC 34.4 30.0 - 36.0 g/dL   RDW 84.9 88.4 - 84.4 %   Platelets 197 150 - 400 K/uL   nRBC 0.0 0.0 - 0.2 %   No results found for this or any previous visit (from the past 240 hours). Creatinine: Recent Labs    06/17/24 1421 06/18/24 0053  CREATININE 1.18 1.25*    Impression/Assessment:  Mild to moderate BPH with incomplete bladder emptying and bilateral hydro-  Plan:  -Continue Foley catheter -Add tamsulosin  -Consider finasteride -Follow urine output and  trend creatinine. -Consider follow-up CT scan to get a better look at the urinary tract and rule out other  etiologies and ensure resolution of hydronephrosis. Although, patient reports he would not want stents.  He had a stent before and had a lot of discomfort and bleeding.  I called and updated his daughter Dr. Cherene Goodell.   Donnice Brooks 06/18/2024, 3:34 PM

## 2024-06-18 NOTE — Plan of Care (Signed)
   Problem: Education: Goal: Knowledge of General Education information will improve Description: Including pain rating scale, medication(s)/side effects and non-pharmacologic comfort measures Outcome: Progressing   Problem: Clinical Measurements: Goal: Cardiovascular complication will be avoided Outcome: Progressing

## 2024-06-18 NOTE — Consult Note (Signed)
 Cardiology Consultation   Patient ID: Eugene White MRN: [id number]; DOB: 1946-05-30  Admit date: 06/17/2024 Date of Consult: 06/18/2024  PCP:  Myrla Jon HERO, MD   [facility] Providers Cardiologist:  Eugene DELENA Leavens, MD        Patient Profile: Eugene White is a 78 y.o. male with a hx of paroxysmal atrial fibrillation, prior MR/TR (only mild MR/TR by echocardiogram 12/2018), hypertension, hyperlipidemia (managed by PCP), prescribed LAD, borderline obesity, diverticulosis, GERD, birth trauma (right facial droop, who is being seen 06/18/2024 for the evaluation of shortness of breath concerning for new onset heart failure at the request of Eugene White.  History of Present Illness: Eugene White initially underwent surgery for left femoral nail removal in 2018.  During surgery he wanted atrial fibrillation with RVR which was new onset.  Echocardiogram revealed normal LV function with an EF of 55 to 60%, mildly dilated left atrium, mild LVH.  He was started on apixaban .  He eventually underwent TEE/DCCV 12/2017 with conversion to normal sinus rhythm.  TEE had shown moderate MR/TR as well.  Since then he required additional DCCV 12/2018 for reoccurrence.  He had required medication adjustments since then.  He was ultimately placed on flecainide  in 08/2019 and underwent repeat DCCV 08/2019.  He was without prior ischemic assessment.  Eugene White offered an ETT at the office visit in 12/2019 the patient declined at that time.  Prior carotid duplex 12/2017 showed 1-39% stenosis of the left felt to be normal by Eugene White.  At visit 12/2020 with Eugene White the patient requested go off of flecainide  since he had not had any recurrent issues with atrial fibrillation.  Repeat echocardiogram 12/2020 revealed LVEF 55 to 60%, mild LAE, mild MR/TR, aortic sclerosis without stenosis.  He was evaluated in clinic in follow-up in 03/13/2022 where he was feeling fatigued and unwell.  He was in atrial  fibrillation at that time and underwent successful DCCV on 04/04/22 with return to normal sinus rhythm.  He had been evaluated in the emergency department in the fall 2023 for urological concerns.  He presented to the ED with left flank pain and it was thought to be related to kidney stones.  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  Hemoglobin dropped to 8.  He was in the hospital back and forth between atrial fibrillation normal sinus rhythm and eventually ended up in normal sinus rhythm.  Had recovered well from his procedure and hemoglobin returned back to 14.  Was not on Eliquis  due to hematuria postprocedure.  Otherwise doing well from the cardiovascular standpoint.  He was seen in clinic 05/21/2023 stating he was doing well. He continues to take magnesium  and potassium supplements.  He did have a kidney bleed from the fall but hemoglobin had remained stable.  PCP had started him on losartan  for elevated diastolic blood pressure which had helped even though blood pressure was slightly elevated at his appointment.  He had lost 50 pounds over the last 2 years.  There were no changes in his medication and no further testing was ordered at that time.  He was last seen in clinic 05/16/2024.  He continued to experience problems sleeping at night and feels short winded attributed to inactivity.  Denied any edema.  They discussed reconsidering Watchman procedure in the future.  Losartan  was increased to 50 mg daily with a repeat be met in 1 to  2 weeks.  He presented to the [facility] emergency department 06/17/2024 with shortness of breath and swelling in his abdomen and legs.  Reporting fatigue, loss of appetite, and weight gain.  He stated he had gained a significant amount of weight approximately 20 pounds over the last 20 days.  States that he was retaining weight in his arms, abdomen, and legs.  Denies any  history of heart failure.  States that he had been started on Lasix  on Monday called and told by his primary care physician that he needs to come to the hospital given concern that he might need IV Lasix .  He endorsed generalized fatigue and weakness sleeping on 1-2 pillows at night.  States that he drinks 3-4 beers on a daily basis but this is decreased from prior.  States that he has had a history of alcohol withdrawal.  Denies any history of cirrhosis.  Initial vital signs reveal blood pressure 129/96, pulse 53, respirations of 20, temperature of 97.7.  Pertinent labs revealed sodium of 129, CO2 of 20, blood glucose 123, hemoglobin 12.2, BNP of greater than 4500, INR mildly elevated at 1.6.  Chest x-ray was without significant pulmonary edema.  He was given furosemide  40 mg IVP in the emergency department.  Cardiology was consulted to assist in management of shortness of breath concerning for new onset heart failure.   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   First degree AV block    GERD (gastroesophageal reflux disease)    GI symptom    had nausea / vomited once / frequent stools / getting better   Hyperlipidemia    Hypertension    Hypothyroidism    Mild carotid artery disease (HCC)    Mitral regurgitation    Mood changes    PAF (paroxysmal atrial fibrillation) (HCC)    Pneumonia    hx of   Tricuspid regurgitation    Tricuspid regurgitation     Past Surgical History:  Procedure Laterality Date   CARDIOVERSION N/A 01/12/2018   Procedure: CARDIOVERSION;  Surgeon: White Leim DEL, MD;  Location: [facility] ENDOSCOPY;  Service:  Cardiovascular;  Laterality: N/A;   CARDIOVERSION N/A 01/26/2019   Procedure: CARDIOVERSION;  Surgeon: Jeffrie Oneil BROCKS, MD;  Location: [facility] ENDOSCOPY;  Service: Cardiovascular;  Laterality: N/A;   CARDIOVERSION N/A 09/30/2019   Procedure: CARDIOVERSION;  Surgeon: Pietro Redell RAMAN, MD;  Location: [facility] ENDOSCOPY;  Service: Cardiovascular;  Laterality: N/A;   CARDIOVERSION N/A 04/04/2022   Procedure: CARDIOVERSION;  Surgeon: Alvan Ronal BRAVO, MD;  Location: [facility] ENDOSCOPY;  Service: Cardiovascular;  Laterality: N/A;   COLONOSCOPY  2014   CYSTOSCOPY WITH STENT PLACEMENT Left 06/18/2022   Procedure: CYSTOSCOPY WITH STENT PLACEMENT;  Surgeon: Penne Knee, MD;  Location: [facility] ORS;  Service: Urology;  Laterality: Left;   CYSTOSCOPY WITH STENT PLACEMENT Left 07/21/2022   Procedure: CYSTOSCOPY WITH STENT EXCHANGE;  Surgeon: Penne Knee, MD;  Location: [facility] ORS;  Service: Urology;  Laterality: Left;   FEMUR IM NAIL Left 10/11/2016   Procedure: INTRAMEDULLARY (IM) NAIL FEMORAL;  Surgeon: Norleen JINNY Maltos, MD;  Location: [facility] ORS;  Service: Orthopedics;  Laterality: Left;   HAND SURGERY   1973 / 2010 / 2015   right to release tendons   HARDWARE REMOVAL Left 10/05/2017   Procedure: Removal of left femoral nail;  Surgeon: Ernie Cough, MD;  Location: [facility] ORS;  Service: Orthopedics;  Laterality: Left;  90 mins   HEMATOMA EVACUATION Left 01/19/2018   Procedure: Irrigation and debridement, Evacuation of left total knee hematoma;  Surgeon: Ernie Cough, MD;  Location: [facility] ORS;  Service: Orthopedics;  Laterality: Left;   KNEE ARTHROSCOPY  1984 & 2001   twice   NASAL SEPTUM SURGERY     removal Left femoral nail      10/05/17 Dr. ERNIE   SHOULDER OPEN ROTATOR CUFF REPAIR  1999 & 2006   Right and left   TEE WITH CARDIOVERSION  01/12/2018   TEE WITHOUT CARDIOVERSION N/A 01/12/2018   Procedure: TRANSESOPHAGEAL ECHOCARDIOGRAM (TEE);  Surgeon: White Leim DEL, MD;  Location: [facility] ENDOSCOPY;  Service: Cardiovascular;   Laterality: N/A;   TONSILLECTOMY     TOTAL KNEE ARTHROPLASTY Right 01/09/2015   Procedure: RIGHT TOTAL KNEE ARTHROPLASTY;  Surgeon: Cough JONETTA Ernie, MD;  Location: [facility] ORS;  Service: Orthopedics;  Laterality: Right;   TOTAL KNEE ARTHROPLASTY Left 11/23/2017   Procedure: LEFT TOTAL KNEE ARTHROPLASTY;  Surgeon: Ernie Cough, MD;  Location: [facility] ORS;  Service: Orthopedics;  Laterality: Left;  90 mins   URETERAL BIOPSY Left 07/21/2022   Procedure: URETERAL BIOPSY;  Surgeon: Penne Knee, MD;  Location: [facility] ORS;  Service: Urology;  Laterality: Left;   URETEROSCOPY Left 07/21/2022   Procedure: URETEROSCOPY;  Surgeon: Penne Knee, MD;  Location: [facility] ORS;  Service: Urology;  Laterality: Left;     Home Medications:  Prior to Admission medications   Medication Sig Start Date End Date Taking? Authorizing Provider  acetaminophen  (TYLENOL ) 500 MG tablet Take 1,500 mg by mouth every 6 (six) hours as needed for moderate pain (pain score 4-6).   Yes [provider]  apixaban  (ELIQUIS ) 5 MG TABS tablet Take 1 tablet (5 mg total) by mouth 2 (two) times daily. 04/20/24  Yes Conte, Tessa N, PA-C  atorvastatin  (LIPITOR) 20 MG tablet Take 1 tablet (20 mg total) by mouth daily. 04/20/24  Yes Bacigalupo, Angela M, MD  Cholecalciferol  (VITAMIN D ) 2000 UNITS CAPS Take 2,000 Units by mouth every evening.    Yes [provider]  furosemide  (LASIX ) 40 MG tablet Take 1 tablet (40 mg total) by mouth daily. 06/13/24  Yes Simmons-Robinson, Makiera, MD  levothyroxine  (SYNTHROID ) 100 MCG tablet Take 1 tablet (100 mcg total) by mouth daily. 01/05/24  Yes Bacigalupo, Jon HERO, MD  losartan  (COZAAR ) 50 MG tablet Take 1 tablet (50 mg total) by mouth daily. 05/16/24  Yes Chandrasekhar, Mahesh A, MD  magnesium  oxide (MAG-OX) 400 (240 Mg) MG tablet TAKE 1 TABLET TWICE DAILY . 05/16/24  Yes Chandrasekhar, Mahesh A, MD  metoprolol  tartrate (LOPRESSOR ) 25 MG tablet Take 1 tablet (25 mg total) by mouth 2 (two) times daily.  08/04/23  Yes Conte, Tessa N, PA-C  Multiple Vitamin (MULTIVITAMIN WITH MINERALS) TABS tablet Take 1 tablet by mouth daily.    Yes [provider]  Multiple Vitamins-Minerals (PRESERVISION AREDS 2) CAPS Take 1 capsule by mouth 2 (two) times daily.    Yes [provider]  pantoprazole  (PROTONIX ) 40 MG tablet Take 1 tablet (40 mg total) by mouth daily. 01/05/24  Yes Bacigalupo, Angela M, MD  potassium chloride  SA (KLOR-CON  M20) 20 MEQ tablet Take 2 tablets (40 mEq  total) by mouth daily. 05/26/24  Yes Conte, Tessa N, PA-C  venlafaxine  XR (EFFEXOR -XR) 75 MG 24 hr capsule TAKE 1 CAPSULE EVERY DAY 01/05/24  Yes Bacigalupo, Angela M, MD  amoxicillin (AMOXIL) 500 MG capsule Take 4 capsules by mouth as directed. Before dental appointment 12/11/21   [provider]    Scheduled Meds:  apixaban   5 mg Oral BID   atorvastatin   20 mg Oral Daily   Chlorhexidine  Gluconate Cloth  6 each Topical Daily   furosemide   40 mg Intravenous BID   levothyroxine   100 mcg Oral Q0600   metoprolol  tartrate  25 mg Oral BID   pantoprazole   40 mg Oral Daily   potassium chloride   20 mEq Oral BID   venlafaxine  XR  75 mg Oral Q breakfast   Continuous Infusions:  PRN Meds: acetaminophen  **OR** acetaminophen , ondansetron  **OR** ondansetron  (ZOFRAN ) IV, zolpidem   Allergies:    Allergies  Allergen Reactions   Sulfa Antibiotics Other (See Comments)    Unknown/childhood allergy unknown    Social History:   Social History   Socioeconomic History   Marital status: Married    Spouse name: Scientist, product/process development   Number of children: 2   Years of education: Not on file   Highest education level: Professional school degree (e.g., MD, DDS, DVM, JD)  Occupational History   Occupation: retired, has degree in physiology  Tobacco Use   Smoking status: Former    Current packs/day: 0.00    Types: Cigarettes    Quit date: 12/29/1968    Years since quitting: 55.5   Smokeless tobacco: Never  Vaping Use   Vaping status:  Never Used  Substance and Sexual Activity   Alcohol use: Yes    Alcohol/week: 21.0 standard drinks of alcohol    Types: 21 Cans of beer per week   Drug use: No   Sexual activity: Yes  Other Topics Concern   Not on file  Social History Narrative   Pt lives in [place] w/ wife.   Social Drivers of Corporate investment banker Strain: Low Risk  (10/08/2018)   Overall Financial Resource Strain (CARDIA)    Difficulty of Paying Living Expenses: Not hard at all  Food Insecurity: No Food Insecurity (06/18/2024)   Hunger Vital Sign    Worried About Running Out of Food in the Last Year: Never true    Ran Out of Food in the Last Year: Never true  Transportation Needs: No Transportation Needs (06/18/2024)   PRAPARE - Administrator, Civil Service (Medical): No    Lack of Transportation (Non-Medical): No  Physical Activity: Inactive (07/13/2023)   Exercise Vital Sign    Days of Exercise per Week: 0 days    Minutes of Exercise per Session: 0 min  Stress: No Stress Concern Present (07/13/2023)   [employer] of Occupational Health - Occupational Stress Questionnaire    Feeling of Stress : Not at all  Social Connections: Socially Isolated (06/18/2024)   Social Connection and Isolation Panel    Frequency of Communication with Friends and Family: Twice a week    Frequency of Social Gatherings with Friends and Family: Never    Attends Religious Services: Never    Database administrator or Organizations: No    Attends Banker Meetings: Never    Marital Status: Married  Catering manager Violence: Not At Risk (06/18/2024)   Humiliation, Afraid, Rape, and Kick questionnaire    Fear of Current or Ex-Partner: No  Emotionally Abused: No    Physically Abused: No    Sexually Abused: No    Family History:    Family History  Problem Relation Age of Onset   Breast cancer Mother    Colon cancer Neg Hx    Esophageal cancer Neg Hx    Stomach cancer Neg Hx    Rectal  cancer Neg Hx      ROS:  Please see the history of present illness.  Review of Systems  Constitutional:  Positive for malaise/fatigue.  Respiratory:  Positive for shortness of breath.   Cardiovascular:  Positive for orthopnea and leg swelling.  Neurological:  Positive for weakness.    All other ROS reviewed and negative.     Physical Exam/Data: Vitals:   06/18/24 0335 06/18/24 0500 06/18/24 0908 06/18/24 1148  BP: (!) 117/91  (!) 138/98 (!) 130/96  Pulse: 73  89 87  Resp: 19  12   Temp: (!) 97.3 F (36.3 C)  97.9 F (36.6 C) 97.9 F (36.6 C)  TempSrc: Oral  Oral   SpO2: 100%  94% 100%  Weight:  86 kg    Height:        Intake/Output Summary (Last 24 hours) at 06/18/2024 1302 Last data filed at 06/18/2024 1200 Gross per 24 hour  Intake 240 ml  Output 1125 ml  Net -885 ml      06/18/2024    5:00 AM 06/18/2024   12:09 AM 06/17/2024    2:17 PM  Last 3 Weights  Weight (lbs) 189 lb 9.5 oz 189 lb 13.1 oz 194 lb  Weight (kg) 86 kg 86.1 kg 87.998 kg     Body mass index is 26.44 kg/m.  General:  Well nourished, well developed, in no acute distress HEENT: normal Neck: + JVD Vascular: No carotid bruits; Distal pulses 2+ bilaterally Cardiac:  normal S1, S2; RRR; no murmur  Lungs: Diminished to auscultation bilaterally, no wheezing, rhonchi or rales, respirations are unlabored at rest on 2 L of O2 via nasal cannula Abd: soft, nontender, no hepatomegaly  Ext:  2-3+ edema BLE to lower abdomen  Musculoskeletal:  No deformities, BUE and BLE strength normal and equal Skin: warm and dry  Neuro:  CNs 2-12 intact, no focal abnormalities noted Psych:  Normal affect   EKG:  The EKG was personally reviewed and demonstrates: Sinus rhythm, first-degree AV block, PACs, rate of 76, left bundle branch block Telemetry:  Telemetry was personally reviewed and demonstrates: Sinus rhythm  Relevant CV Studies: 2D echo 01/21/21 1. Left ventricular ejection fraction, by estimation, is 55 to 60%.  The  left ventricle has normal function. The left ventricle has no regional  wall motion abnormalities. Left ventricular diastolic function could not  be evaluated.   2. Right ventricular systolic function is normal. The right ventricular  size is normal.   3. Left atrial size was mildly dilated.   4. The mitral valve is normal in structure. Mild mitral valve  regurgitation. No evidence of mitral stenosis.   5. The aortic valve is normal in structure. There is mild calcification  of the aortic valve. There is moderate thickening of the aortic valve.  Aortic valve regurgitation is not visualized. Mild to moderate aortic  valve sclerosis/calcification is  present, without any evidence of aortic stenosis.   6. The inferior vena cava is normal in size with greater than 50%  respiratory variability, suggesting right atrial pressure of 3 mmHg.   2D echo 01/12/2018 Study Conclusions  -  Left ventricle: Systolic function was normal. The estimated    ejection fraction was in the range of 55% to 60%. Wall motion was    normal; there were no regional wall motion abnormalities.  - Aortic valve: There was mild regurgitation.  - Mitral valve: There was moderate regurgitation.  - Left atrium: The atrium was dilated. No evidence of thrombus in    the atrial cavity or appendage. No evidence of thrombus in the    atrial cavity or appendage. No evidence of thrombus in the atrial    cavity or appendage.  - Right atrium: The atrium was dilated. No evidence of thrombus in    the atrial cavity or appendage.  - Atrial septum: No defect or patent foramen ovale was identified.  - Tricuspid valve: There was moderate regurgitation.   2D echo 10/05/2017 Study Conclusions  - Left ventricle: The cavity size was normal. Wall thickness was    increased in a pattern of mild LVH. Systolic function was normal.    The estimated ejection fraction was in the range of 55% to 60%.    Wall motion was normal; there were no  regional wall motion    abnormalities.  - Left atrium: The atrium was mildly dilated.  Laboratory Data: High Sensitivity Troponin:   Recent Labs  Lab 06/17/24 1421  TROPONINIHS 34*     Chemistry Recent Labs  Lab 06/17/24 1421 06/18/24 0053  NA 129* 129*  K 4.2 4.0  CL 98 98  CO2 20* 20*  GLUCOSE 123* 109*  BUN 21 24*  CREATININE 1.18 1.25*  CALCIUM  8.9 8.8*  GFRNONAA >60 59*  ANIONGAP 11 11    Recent Labs  Lab 06/17/24 1421  PROT 5.8*  ALBUMIN 3.7  AST 30  ALT 18  ALKPHOS 58  BILITOT 1.3*   Lipids No results for input(s): CHOL, TRIG, HDL, LABVLDL, LDLCALC, CHOLHDL in the last 168 hours.  Hematology Recent Labs  Lab 06/17/24 1421 06/18/24 0053  WBC 4.8 5.1  RBC 3.49* 3.39*  HGB 12.2* 11.7*  HCT 34.5* 34.0*  MCV 98.9 100.3*  MCH 35.0* 34.5*  MCHC 35.4 34.4  RDW 15.0 15.0  PLT 212 197   Thyroid  No results for input(s): TSH, FREET4 in the last 168 hours.  BNP Recent Labs  Lab 06/17/24 1421  BNP >4,500.0*    DDimer No results for input(s): DDIMER in the last 168 hours.  Radiology/Studies:  US  Abdomen Complete Result Date: 06/18/2024 CLINICAL DATA:  Ascites. EXAM: ABDOMEN ULTRASOUND COMPLETE COMPARISON:  CT with contrast 07/03/2022. FINDINGS: Gallbladder: Gallbladder is distended. There is a small lobular area dependently measured by the sonographer 4 mm which is not shadow. This could be mass effect from adjacent structures versus a small polyp. No clear shadowing stones, wall thickening. No reported Murphy's sign. Common bile duct: Diameter: 7 mm. Liver: Multiple anechoic foci identified in the liver consistent with numerous cysts. Example left hepatic lobe measures up to 3.5 cm. Smaller foci measure 14 mm. There is larger right-sided focus peripherally measuring 4.9 cm. Portal vein is patent on color Doppler imaging with normal direction of blood flow towards the liver. IVC: No abnormality visualized. Pancreas: Obscured by overlapping  bowel gas and soft tissue. Spleen: Size and appearance within normal limits. Poorly seen with overlapping bowel gas and soft tissue. Right Kidney: Length: 11 cm.  Severe collecting system dilatation. Left Kidney: Length: 10.4 cm. Severe collecting system dilatation. This was seen on the CT scan of 07/03/2022. Abdominal aorta: No  aneurysm visualized. Other findings: Bladder has a lobular contour which are brachial a shins and is distended. Bladder volume calculated at 625 cc. Postvoid bladder residual of 471 cc. IMPRESSION: Bilateral severe collecting system dilatation. Left-sided dilatation was seen on the remote CT scan. The right however is new. Please correlate for history otherwise recommend further evaluation such as CT scan. Bladder wall thickening which are back elation. Large postvoid bladder residual. Numerous hepatic cysts. Question small gallbladder polyp.  No biliary ductal dilatation. Limited visualization of several structures with overlapping bowel gas and soft tissue. Electronically Signed   By: Ranell Bring M.D.   On: 06/18/2024 10:38   DG Chest 2 View Result Date: 06/17/2024 CLINICAL DATA:  Shortness of breath EXAM: CHEST - 2 VIEW COMPARISON:  06/10/2024 FINDINGS: Mild cardiomegaly with trace pleural effusions. Slight central congestion. No consolidation. Aortic atherosclerosis. No pneumothorax. Multiple mild wedging deformities of mid to lower thoracic vertebra. IMPRESSION: Mild cardiomegaly with trace pleural effusions and slight central congestion. Electronically Signed   By: Luke Bun M.D.   On: 06/17/2024 17:30     Assessment and Plan: Acute congestive heart failure/shortness of breath -Echocardiogram completed in 12/2020 revealed an LVEF 55 to 60%, no RWMA, right ventricular systolic function and size were normal, mild mitral regurgitation mild to moderate aortic valve sclerosis without evidence of stenosis - Patient presented with weight gain, shortness of breath, orthopnea, and  edema -BNP greater than 4500 -Given 40 mg of IV furosemide  in the emergency department and continued on 40 mg IV twice daily -Echocardiogram ordered and pending with further recommendations to follow -Abdominal ultrasound concerning for bladder outlet obstruction - Daily BMP while on diuretic therapy -Strict I's and O's Daily weights  Paroxysmal atrial fibrillation -Maintaining sinus rhythm -Continued on apixaban  5 mg twice daily for CHA2DS2-VASc score of at least 4 for stroke prophylaxis -Currently continued on metoprolol  tartrate 25 mg twice daily - Continue on telemetry monitoring  Hyponatremia -Serum sodium 129 -Daily BMP -Limit free water  intake  Primary hypertension -Blood pressure 130/96 -Continued on metoprolol  and furosemide , PTA losartan  placed on hold on arrival by primary team, with continued elevated blood pressures will likely need to be restarted -Vital signs per unit protocol  Ascites -Abdominal ultrasound completed revealing severe collecting system dilatation bilaterally -Left-sided dilatation was seen on remote CT scan right sided however is new -Bladder wall thickening with large postvoid bladder residual -Would benefit from urology consultation -No biliary ductal dilatation -Numerous hepatic cysts -Ongoing management per IM   Risk Assessment/Risk Scores:       [place]  Heart Association (NYHA) Functional Class NYHA Class III  CHA2DS2-VASc Score = 4   This indicates a 4.8% annual risk of stroke. The patient's score is based upon: CHF History: 1 HTN History: 1 Diabetes History: 0 Stroke History: 0 Vascular Disease History: 0 Age Score: 2 Gender Score: 0        For questions or updates, please contact [facility] Please consult [email or web address] for contact info under    Signed, Jae Skeet, NP  06/18/2024 1:02 PM

## 2024-06-19 DIAGNOSIS — I10 Essential (primary) hypertension: Secondary | ICD-10-CM | POA: Diagnosis not present

## 2024-06-19 DIAGNOSIS — I480 Paroxysmal atrial fibrillation: Secondary | ICD-10-CM | POA: Diagnosis not present

## 2024-06-19 DIAGNOSIS — E871 Hypo-osmolality and hyponatremia: Secondary | ICD-10-CM | POA: Diagnosis not present

## 2024-06-19 DIAGNOSIS — I420 Dilated cardiomyopathy: Secondary | ICD-10-CM

## 2024-06-19 DIAGNOSIS — I509 Heart failure, unspecified: Secondary | ICD-10-CM | POA: Diagnosis not present

## 2024-06-19 DIAGNOSIS — I5031 Acute diastolic (congestive) heart failure: Secondary | ICD-10-CM | POA: Diagnosis not present

## 2024-06-19 LAB — CBC
HCT: 32 % — ABNORMAL LOW (ref 39.0–52.0)
Hemoglobin: 11.2 g/dL — ABNORMAL LOW (ref 13.0–17.0)
MCH: 35.1 pg — ABNORMAL HIGH (ref 26.0–34.0)
MCHC: 35 g/dL (ref 30.0–36.0)
MCV: 100.3 fL — ABNORMAL HIGH (ref 80.0–100.0)
Platelets: 162 10*3/uL (ref 150–400)
RBC: 3.19 MIL/uL — ABNORMAL LOW (ref 4.22–5.81)
RDW: 15 % (ref 11.5–15.5)
WBC: 5.8 10*3/uL (ref 4.0–10.5)
nRBC: 0 % (ref 0.0–0.2)

## 2024-06-19 LAB — HEPARIN LEVEL (UNFRACTIONATED): Heparin Unfractionated: >1.1 [IU]/mL — ABNORMAL HIGH (ref 0.30–0.70)

## 2024-06-19 LAB — COMPREHENSIVE METABOLIC PANEL WITH GFR
ALT: 14 U/L (ref 0–44)
AST: 19 U/L (ref 15–41)
Albumin: 3.4 g/dL — ABNORMAL LOW (ref 3.5–5.0)
Alkaline Phosphatase: 49 U/L (ref 38–126)
Anion gap: 8 (ref 5–15)
BUN: 21 mg/dL (ref 8–23)
CO2: 25 mmol/L (ref 22–32)
Calcium: 8.4 mg/dL — ABNORMAL LOW (ref 8.9–10.3)
Chloride: 101 mmol/L (ref 98–111)
Creatinine, Ser: 1.07 mg/dL (ref 0.61–1.24)
GFR, Estimated: >60 mL/min (ref >60)
Glucose, Bld: 108 mg/dL — ABNORMAL HIGH (ref 70–99)
Potassium: 3.2 mmol/L — ABNORMAL LOW (ref 3.5–5.1)
Sodium: 134 mmol/L — ABNORMAL LOW (ref 135–145)
Total Bilirubin: 1 mg/dL (ref 0.0–1.2)
Total Protein: 5.2 g/dL — ABNORMAL LOW (ref 6.5–8.1)

## 2024-06-19 LAB — ECHOCARDIOGRAM COMPLETE
AR max vel: 1.98 cm2
AV Area VTI: 1.89 cm2
AV Area mean vel: 1.95 cm2
AV Mean grad: 3 mmHg
AV Peak grad: 5.7 mmHg
Ao pk vel: 1.19 m/s
Area-P 1/2: 3.12 cm2
Calc EF: 21.7 %
Height: 71 in
MV VTI: 2.94 cm2
P 1/2 time: 386 ms
S' Lateral: 4.7 cm
Single Plane A2C EF: 24.2 %
Single Plane A4C EF: 24.8 %
Weight: 3033.53 [oz_av]

## 2024-06-19 LAB — MAGNESIUM: Magnesium: 1.6 mg/dL — ABNORMAL LOW (ref 1.7–2.4)

## 2024-06-19 LAB — APTT: aPTT: 35 s (ref 24–36)

## 2024-06-19 MED ORDER — HEPARIN (PORCINE) 25000 UT/250ML-% IV SOLN
1300.0000 [IU]/h | INTRAVENOUS | Status: DC
  Administered 2024-06-19: 1300 [IU]/h via INTRAVENOUS
  Filled 2024-06-19: qty 250

## 2024-06-19 MED ORDER — POTASSIUM CHLORIDE CRYS ER 20 MEQ PO TBCR
40.0000 meq | EXTENDED_RELEASE_TABLET | Freq: Two times a day (BID) | ORAL | Status: DC
  Administered 2024-06-19 – 2024-06-22 (×7): 40 meq via ORAL
  Filled 2024-06-19 (×7): qty 2

## 2024-06-19 MED ORDER — LOSARTAN POTASSIUM 25 MG PO TABS
12.5000 mg | ORAL_TABLET | Freq: Every day | ORAL | Status: DC
  Administered 2024-06-19 – 2024-06-21 (×3): 12.5 mg via ORAL
  Filled 2024-06-19 (×3): qty 1

## 2024-06-19 MED ORDER — MAGNESIUM SULFATE 2 GM/50ML IV SOLN
2.0000 g | Freq: Once | INTRAVENOUS | Status: AC
  Administered 2024-06-19: 2 g via INTRAVENOUS
  Filled 2024-06-19: qty 50

## 2024-06-19 MED ORDER — METOPROLOL SUCCINATE ER 25 MG PO TB24
25.0000 mg | ORAL_TABLET | Freq: Every evening | ORAL | Status: DC
  Administered 2024-06-19 – 2024-06-23 (×5): 25 mg via ORAL
  Filled 2024-06-19 (×5): qty 1

## 2024-06-19 NOTE — Progress Notes (Signed)
 PROGRESS NOTE  Eugene GOLDSBOROUGH  FMW:[id number] DOB: 1946/07/03 DOA: 06/17/2024 PCP: Myrla Jon HERO, MD  Consultants  Brief Narrative: 78 y.o. male with a PMH significant for HTN, obesity, pAF on Eliquis , hypothyroidism who presented with SOB, swelling, fatigue.  Patient lost about 50 lbs over the last year, but in the last month, has gained 20 lbs, and during the same time, has noticed swelling in his abdomen and legs.  Finally, he went to PCP's office, we here he was noted to have BNP >4000, and started on Lasix .  He trialed this at home and had marginal improvement, but was still too SOB to walk across the room so he came to the ER.  Started on IV Lasix  and [facility] called to admit.   Assessment & Plan: Acute CHF (congestive heart failure) (HCC) - Overall he reports that his breathing has somewhat improved though is still short of breath if he tries to move around room. - Echo from yesterday showed EF of 25%.  Noted decrease from EF several years ago.  Continue IV Lasix .   - Cardiology following.  Has started low-dose ARB  - Recommending right and left heart catheterization this admission for concerns for ischemia especially in light of LBBB on EKG - Patient currently without chest pain   Cardiomyopathy: - Agree with cessation of alcohol and right/left heart cath    Paroxysmal atrial fibrillation (HCC) In sinus rhythm here - Cardiology has switched to heparin  in prep for cardiac catheterization. - Added metoprolol  succinate rather than tartrate. - Switch back to Eliquis  after cardiac cath  BPH: - Foley catheter placed yesterday. - Much better urine output since placement. - Urology following.  Appreciate evaluation and recommendations - Urology started Flomax , will continue.  Daily alcohol use: - No signs of withdrawal, will closely monitor for this. - Recommend cessation in light of cardiomyopathy above  Elevated Cr: - not quite to level of AKI--> now downtrending since  placement of Foley.  Likely obstructive - Continue Flomax  and trend.   Ascites - likely cardiogenic.  But he reports daily alcohol use. - numerous liver cysts noted without frank fibrosis/cirrhosis noted on liver ultrasound   Hyponatremia - Much improved today with diuresis.  Secondary to volume overload - Continue diuresis  Hypokalemia/hypomagnesemia: - Will continue to trend in light of ongoing diuresis.  Replaced today.  Macrocytic anemia: - reports daily EtOH usage - trend Hgb.  No bleeding   Obesity (BMI 30-39.9) History of class 1 obesity, lost 50 pounds last year, gained 20 since start of June.     Essential (primary) hypertension Blood pressure normal - Continue metoprolol  - Hold losartan  - Lasix    Hypothyroidism TSH minimally elevated 1 week ago - Continue home levothyroxine   DVT prophylaxis:  heparin   Code Status:   Code Status: Full Code Family Communication: spoke with daughter by phone Level of care: Telemetry Cardiac Status is: Inpatient Dispo:  pending further diuresis and right/left heart cath  Consults called: Urology, cardiology   Subjective: Patient awake and alert, still with SOB with moderate exertion, better than admission.  He has noted good urine output through his Foley in the bag by his bed.  No complaints or concerns today.  Objective: Vitals:   06/19/24 0354 06/19/24 0500 06/19/24 0800 06/19/24 1116  BP: 121/84  113/86 113/88  Pulse: 90  77 69  Resp: 18     Temp: (!) 97.5 F (36.4 C)  97.7 F (36.5 C) 97.8 F (36.6 C)  TempSrc:  SpO2: 100%  100% 100%  Weight:  86.3 kg    Height:        Intake/Output Summary (Last 24 hours) at 06/19/2024 1407 Last data filed at 06/19/2024 1100 Gross per 24 hour  Intake 0 ml  Output 3925 ml  Net -3925 ml   Filed Weights   06/18/24 0009 06/18/24 0500 06/19/24 0500  Weight: 86.1 kg 86 kg 86.3 kg   Body mass index is 26.54 kg/m.  Gen: 78 y.o. male in no apparent distress.   Nontoxic HEENT:   Right eyelid everted and with some Right sided facial droop, both being chronic  Pulm: Non-labored breathing.  Clear to auscultation bilaterally.  CV: Regular rate and rhythm. JVP to mid-neck GI: Abdomen soft, non-tender, non-distended.  Trace to minimal ascites noted Ext: Warm, no deformities, +2 BL LE edema to mid thighs Skin: No rashes, lesions Neuro: Alert and oriented x 4. Muscle tone normal, moving all 4 limbs symmetrically Psych: Calm  Judgement and insight appear normal. Mood & affect appropriate.     I have personally reviewed the following labs and images: CBC: Recent Labs  Lab 06/17/24 1421 06/18/24 0053 06/19/24 0823  WBC 4.8 5.1 5.8  HGB 12.2* 11.7* 11.2*  HCT 34.5* 34.0* 32.0*  MCV 98.9 100.3* 100.3*  PLT 212 197 162   BMP &GFR Recent Labs  Lab 06/17/24 1421 06/18/24 0053 06/19/24 0823  NA 129* 129* 134*  K 4.2 4.0 3.2*  CL 98 98 101  CO2 20* 20* 25  GLUCOSE 123* 109* 108*  BUN 21 24* 21  CREATININE 1.18 1.25* 1.07  CALCIUM  8.9 8.8* 8.4*  MG  --   --  1.6*   Estimated Creatinine Clearance: 60.6 mL/min (by C-G formula based on SCr of 1.07 mg/dL). Liver & Pancreas: Recent Labs  Lab 06/17/24 1421 06/19/24 0823  AST 30 19  ALT 18 14  ALKPHOS 58 49  BILITOT 1.3* 1.0  PROT 5.8* 5.2*  ALBUMIN 3.7 3.4*   No results for input(s): LIPASE, AMYLASE in the last 168 hours. No results for input(s): AMMONIA in the last 168 hours. Diabetic: No results for input(s): HGBA1C in the last 72 hours. No results for input(s): GLUCAP in the last 168 hours. Cardiac Enzymes: No results for input(s): CKTOTAL, CKMB, CKMBINDEX, TROPONINI in the last 168 hours. No results for input(s): PROBNP in the last 8760 hours. Coagulation Profile: Recent Labs  Lab 06/17/24 1421  INR 1.6*   Thyroid  Function Tests: No results for input(s): TSH, T4TOTAL, FREET4, T3FREE, THYROIDAB in the last 72 hours. Lipid Profile: No results for  input(s): CHOL, HDL, LDLCALC, TRIG, CHOLHDL, LDLDIRECT in the last 72 hours. Anemia Panel: No results for input(s): VITAMINB12, FOLATE, FERRITIN, TIBC, IRON, RETICCTPCT in the last 72 hours. Urine analysis:    Component Value Date/Time   COLORURINE YELLOW (A) 06/18/2022 1655   APPEARANCEUR Hazy (A) 09/17/2022 1555   LABSPEC 1.018 06/18/2022 1655   PHURINE 6.0 06/18/2022 1655   GLUCOSEU Negative 09/17/2022 1555   HGBUR NEGATIVE 06/18/2022 1655   BILIRUBINUR Negative 09/17/2022 1555   KETONESUR 5 (A) 06/18/2022 1655   PROTEINUR 2+ (A) 09/17/2022 1555   PROTEINUR 100 (A) 06/18/2022 1655   UROBILINOGEN 0.2 06/26/2022 1608   UROBILINOGEN 0.2 01/03/2015 1433   NITRITE Negative 09/17/2022 1555   NITRITE NEGATIVE 06/18/2022 1655   LEUKOCYTESUR Trace (A) 09/17/2022 1555   LEUKOCYTESUR NEGATIVE 06/18/2022 1655   Sepsis Labs: Invalid input(s): PROCALCITONIN, LACTICIDVEN  Microbiology: No results found for this or  any previous visit (from the past 240 hours).  Radiology Studies: ECHOCARDIOGRAM COMPLETE Result Date: 06/19/2024    ECHOCARDIOGRAM REPORT   Patient Name:   Eugene White Date of Exam: 06/18/2024 Medical Rec #:  [id number]        Height:       71.0 in Accession #:    [id number]       Weight:       189.6 lb Date of Birth:  1946/11/28        BSA:          2.061 m Patient Age:    53 years         BP:           117/91 mmHg Patient Gender: M                HR:           76 bpm. Exam Location:  [facility] Procedure: 2D Echo, Cardiac Doppler, Color Doppler and Strain Analysis (Both            Spectral and Color Flow Doppler were utilized during procedure). Indications:     CHF-Acute Diastolic I50.31  History:         Patient has prior history of Echocardiogram examinations. MR,                  TR                  1st Degree AV Block.  Sonographer:     Bari Roar Referring Phys:  [id number] LONNI SQUIBB DANFORD Diagnosing Phys: Evalene Lunger MD  Sonographer Comments:  Global longitudinal strain was attempted. IMPRESSIONS  1. Left ventricular ejection fraction, by estimation, is 20 to 25%. Left ventricular ejection fraction by 2D MOD biplane is 21.7 %. The left ventricle has severely decreased function. The left ventricle demonstrates global hypokinesis. There is mild left ventricular hypertrophy. Left ventricular diastolic parameters are indeterminate. The average left ventricular global longitudinal strain is -6.8 %.  2. Right ventricular systolic function is moderately reduced. The right ventricular size is mildly enlarged. There is moderately elevated pulmonary artery systolic pressure. The estimated right ventricular systolic pressure is 53.6 mmHg.  3. Left atrial size was moderately dilated.  4. Right atrial size was moderately dilated.  5. Moderate pleural effusion in the left lateral region.  6. The mitral valve is normal in structure. Moderate to severe mitral valve regurgitation. No evidence of mitral stenosis.  7. The aortic valve is calcified. There is moderate calcification of the aortic valve. Aortic valve regurgitation is moderate. Aortic valve sclerosis/calcification is present, without any evidence of aortic stenosis. Aortic valve mean gradient measures 3.0 mmHg.  8. Pulmonic valve regurgitation is moderate.  9. The inferior vena cava is normal in size with greater than 50% respiratory variability, suggesting right atrial pressure of 3 mmHg. FINDINGS  Left Ventricle: Left ventricular ejection fraction, by estimation, is 20 to 25%. Left ventricular ejection fraction by 2D MOD biplane is 21.7 %. The left ventricle has severely decreased function. The left ventricle demonstrates global hypokinesis. The average left ventricular global longitudinal strain is -6.8 %. Strain was performed and the global longitudinal strain is indeterminate. The left ventricular internal cavity size was normal in size. There is mild left ventricular hypertrophy. Left ventricular diastolic  parameters are indeterminate. Right Ventricle: The right ventricular size is mildly enlarged. No increase in right ventricular wall thickness. Right ventricular systolic function is moderately reduced. There is  moderately elevated pulmonary artery systolic pressure. The tricuspid regurgitant velocity is 3.30 m/s, and with an assumed right atrial pressure of 10 mmHg, the estimated right ventricular systolic pressure is 53.6 mmHg. Left Atrium: Left atrial size was moderately dilated. Right Atrium: Right atrial size was moderately dilated. Pericardium: There is no evidence of pericardial effusion. Mitral Valve: The mitral valve is normal in structure. Moderate to severe mitral valve regurgitation. No evidence of mitral valve stenosis. MV peak gradient, 3.6 mmHg. The mean mitral valve gradient is 2.0 mmHg. Tricuspid Valve: The tricuspid valve is normal in structure. Tricuspid valve regurgitation is not demonstrated. No evidence of tricuspid stenosis. Aortic Valve: The aortic valve is calcified. There is moderate calcification of the aortic valve. Aortic valve regurgitation is moderate. Aortic regurgitation PHT measures 386 msec. Aortic valve sclerosis/calcification is present, without any evidence of  aortic stenosis. Aortic valve mean gradient measures 3.0 mmHg. Aortic valve peak gradient measures 5.7 mmHg. Aortic valve area, by VTI measures 1.89 cm. Pulmonic Valve: The pulmonic valve was normal in structure. Pulmonic valve regurgitation is moderate. No evidence of pulmonic stenosis. Aorta: The aortic root is normal in size and structure. Venous: The inferior vena cava is normal in size with greater than 50% respiratory variability, suggesting right atrial pressure of 3 mmHg. IAS/Shunts: No atrial level shunt detected by color flow Doppler. Additional Comments: 3D was performed not requiring image post processing on an independent workstation and was indeterminate. There is a moderate pleural effusion in the left  lateral region.  LEFT VENTRICLE PLAX 2D                        Biplane EF (MOD) LVIDd:         5.20 cm         LV Biplane EF:   Left LVIDs:         4.70 cm                          ventricular LV PW:         1.20 cm                          ejection LV IVS:        1.30 cm                          fraction by LVOT diam:     2.00 cm                          2D MOD LV SV:         30                               biplane is LV SV Index:   15                               21.7 %. LVOT Area:     3.14 cm                                Diastology  LV e' medial:    4.26 cm/s LV Volumes (MOD)               LV E/e' medial:  16.6 LV vol d, MOD    190.0 ml      LV e' lateral:   12.40 cm/s A2C:                           LV E/e' lateral: 5.7 LV vol d, MOD    137.0 ml A4C:                           2D Longitudinal LV vol s, MOD    144.0 ml      Strain A2C:                           2D Strain GLS   -6.8 % LV vol s, MOD    103.0 ml      Avg: A4C: LV SV MOD A2C:   46.0 ml LV SV MOD A4C:   137.0 ml LV SV MOD BP:    37.6 ml RIGHT VENTRICLE RV Basal diam:  5.10 cm RV Mid diam:    4.10 cm RV S prime:     7.07 cm/s TAPSE (M-mode): 1.2 cm LEFT ATRIUM              Index        RIGHT ATRIUM           Index LA diam:        5.60 cm  2.72 cm/m   RA Area:     30.90 cm LA Vol (A2C):   140.0 ml 67.92 ml/m  RA Volume:   131.00 ml 63.55 ml/m LA Vol (A4C):   141.0 ml 68.41 ml/m LA Biplane Vol: 142.0 ml 68.89 ml/m  AORTIC VALVE                    PULMONIC VALVE AV Area (Vmax):    1.98 cm     PV Vmax:          0.90 m/s AV Area (Vmean):   1.95 cm     PV Peak grad:     3.2 mmHg AV Area (VTI):     1.89 cm     PR End Diast Vel: 19.89 msec AV Vmax:           119.00 cm/s  RVOT Peak grad:   1 mmHg AV Vmean:          72.900 cm/s AV VTI:            0.160 m AV Peak Grad:      5.7 mmHg AV Mean Grad:      3.0 mmHg LVOT Vmax:         75.00 cm/s LVOT Vmean:        45.300 cm/s LVOT VTI:          0.096 m LVOT/AV VTI ratio: 0.60  AI PHT:            386 msec  AORTA Ao Root diam: 2.70 cm Ao Asc diam:  3.20 cm MITRAL VALVE               TRICUSPID VALVE MV Area (PHT): 3.12 cm    TR Peak grad:   43.6 mmHg MV Area VTI:   2.94 cm  TR Vmax:        330.00 cm/s MV Peak grad:  3.6 mmHg MV Mean grad:  2.0 mmHg    SHUNTS MV Vmax:       0.95 m/s    Systemic VTI:  0.10 m MV Vmean:      61.8 cm/s   Systemic Diam: 2.00 cm MV Decel Time: 243 msec MV E velocity: 70.70 cm/s Evalene Lunger MD Electronically signed by Evalene Lunger MD Signature Date/Time: 06/19/2024/8:15:28 AM    Final     Scheduled Meds:  atorvastatin   20 mg Oral Daily   Chlorhexidine  Gluconate Cloth  6 each Topical Daily   furosemide   40 mg Intravenous BID   levothyroxine   100 mcg Oral Q0600   losartan   12.5 mg Oral Daily   metoprolol  succinate  25 mg Oral QPM   pantoprazole   40 mg Oral Daily   potassium chloride   40 mEq Oral BID   tamsulosin   0.4 mg Oral QPC supper   venlafaxine  XR  75 mg Oral Q breakfast   Continuous Infusions:  heparin        LOS: 2 days   35 minutes with more than 50% spent in reviewing records, counseling patient/family and coordinating care.  Reyes VEAR Gaw, MD [facility] [email or web address] 06/19/2024, 2:07 PM

## 2024-06-19 NOTE — Care Management Important Message (Signed)
 Important Message  Patient Details  Name: Eugene White MRN: [id number] Date of Birth: [birth year]   Important Message Given:  Yes - Medicare IM     Eugene White 06/19/2024, 2:29 PM

## 2024-06-19 NOTE — Progress Notes (Signed)
 Rounding Note   Patient Name: Eugene White Date of Encounter: 06/19/2024  [facility] Cardiologist: Stanly DELENA Leavens, MD   Subjective Patient seen on AM rounds. Denies any chest pain. Shortness of breath improving. -3.4 L output in the last 24 hours.  Scheduled Meds:  apixaban   5 mg Oral BID   atorvastatin   20 mg Oral Daily   Chlorhexidine  Gluconate Cloth  6 each Topical Daily   furosemide   40 mg Intravenous BID   levothyroxine   100 mcg Oral Q0600   metoprolol  tartrate  25 mg Oral BID   pantoprazole   40 mg Oral Daily   potassium chloride   20 mEq Oral BID   tamsulosin   0.4 mg Oral QPC supper   venlafaxine  XR  75 mg Oral Q breakfast   Continuous Infusions:  PRN Meds: acetaminophen  **OR** acetaminophen , ondansetron  **OR** ondansetron  (ZOFRAN ) IV, zolpidem    Vital Signs  Vitals:   06/19/24 0008 06/19/24 0354 06/19/24 0500 06/19/24 0800  BP: (!) 129/93 121/84  113/86  Pulse: 92 90  77  Resp: 20 18    Temp: (!) 97.4 F (36.3 C) (!) 97.5 F (36.4 C)  97.7 F (36.5 C)  TempSrc:      SpO2: 100% 100%  100%  Weight:   86.3 kg   Height:        Intake/Output Summary (Last 24 hours) at 06/19/2024 0903 Last data filed at 06/19/2024 0805 Gross per 24 hour  Intake 0 ml  Output 3450 ml  Net -3450 ml      06/19/2024    5:00 AM 06/18/2024    5:00 AM 06/18/2024   12:09 AM  Last 3 Weights  Weight (lbs) 190 lb 4.1 oz 189 lb 9.5 oz 189 lb 13.1 oz  Weight (kg) 86.3 kg 86 kg 86.1 kg      Telemetry Sinus with left bundle branch block- Personally Reviewed  ECG  No new tracings- Personally Reviewed  Physical Exam  GEN: No acute distress.   Neck: + JVD Cardiac: RRR, no murmurs, rubs, or gallops.  Respiratory: Diminished to auscultation bilaterally.  Respirations are unlabored at rest on 2 L of O2 via nasal cannula GI: Soft, nontender, non-distended  MS: 1+ pitting lower extremity edema; No deformity. Neuro:  Nonfocal  Psych: Normal affect   Labs High  Sensitivity Troponin:   Recent Labs  Lab 06/17/24 1421  TROPONINIHS 34*     Chemistry Recent Labs  Lab 06/17/24 1421 06/18/24 0053  NA 129* 129*  K 4.2 4.0  CL 98 98  CO2 20* 20*  GLUCOSE 123* 109*  BUN 21 24*  CREATININE 1.18 1.25*  CALCIUM  8.9 8.8*  PROT 5.8*  --   ALBUMIN 3.7  --   AST 30  --   ALT 18  --   ALKPHOS 58  --   BILITOT 1.3*  --   GFRNONAA >60 59*  ANIONGAP 11 11    Lipids No results for input(s): CHOL, TRIG, HDL, LABVLDL, LDLCALC, CHOLHDL in the last 168 hours.  Hematology Recent Labs  Lab 06/17/24 1421 06/18/24 0053 06/19/24 0823  WBC 4.8 5.1 5.8  RBC 3.49* 3.39* 3.19*  HGB 12.2* 11.7* 11.2*  HCT 34.5* 34.0* 32.0*  MCV 98.9 100.3* 100.3*  MCH 35.0* 34.5* 35.1*  MCHC 35.4 34.4 35.0  RDW 15.0 15.0 15.0  PLT 212 197 162   Thyroid  No results for input(s): TSH, FREET4 in the last 168 hours.  BNP Recent Labs  Lab 06/17/24 1421  BNP >  4,500.0*    DDimer No results for input(s): DDIMER in the last 168 hours.   Radiology  ECHOCARDIOGRAM COMPLETE Result Date: 06/19/2024    ECHOCARDIOGRAM REPORT   Patient Name:   Eugene White Date of Exam: 06/18/2024 Medical Rec #:  [id number]        Height:       71.0 in Accession #:    [id number]       Weight:       189.6 lb Date of Birth:  [birth year]        BSA:          2.061 m Patient Age:    78 years         BP:           117/91 mmHg Patient Gender: M                HR:           76 bpm. Exam Location:  [facility] Procedure: 2D Echo, Cardiac Doppler, Color Doppler and Strain Analysis (Both            Spectral and Color Flow Doppler were utilized during procedure). Indications:     CHF-Acute Diastolic I50.31  History:         Patient has prior history of Echocardiogram examinations. MR,                  TR                  1st Degree AV Block.  Sonographer:     Bari Roar Referring Phys:  [id number] LONNI SQUIBB DANFORD Diagnosing Phys: Evalene Lunger MD  Sonographer Comments: Global longitudinal strain  was attempted. IMPRESSIONS  1. Left ventricular ejection fraction, by estimation, is 20 to 25%. Left ventricular ejection fraction by 2D MOD biplane is 21.7 %. The left ventricle has severely decreased function. The left ventricle demonstrates global hypokinesis. There is mild left ventricular hypertrophy. Left ventricular diastolic parameters are indeterminate. The average left ventricular global longitudinal strain is -6.8 %.  2. Right ventricular systolic function is moderately reduced. The right ventricular size is mildly enlarged. There is moderately elevated pulmonary artery systolic pressure. The estimated right ventricular systolic pressure is 53.6 mmHg.  3. Left atrial size was moderately dilated.  4. Right atrial size was moderately dilated.  5. Moderate pleural effusion in the left lateral region.  6. The mitral valve is normal in structure. Moderate to severe mitral valve regurgitation. No evidence of mitral stenosis.  7. The aortic valve is calcified. There is moderate calcification of the aortic valve. Aortic valve regurgitation is moderate. Aortic valve sclerosis/calcification is present, without any evidence of aortic stenosis. Aortic valve mean gradient measures 3.0 mmHg.  8. Pulmonic valve regurgitation is moderate.  9. The inferior vena cava is normal in size with greater than 50% respiratory variability, suggesting right atrial pressure of 3 mmHg. FINDINGS  Left Ventricle: Left ventricular ejection fraction, by estimation, is 20 to 25%. Left ventricular ejection fraction by 2D MOD biplane is 21.7 %. The left ventricle has severely decreased function. The left ventricle demonstrates global hypokinesis. The average left ventricular global longitudinal strain is -6.8 %. Strain was performed and the global longitudinal strain is indeterminate. The left ventricular internal cavity size was normal in size. There is mild left ventricular hypertrophy. Left ventricular diastolic parameters are  indeterminate. Right Ventricle: The right ventricular size is mildly enlarged. No increase in right ventricular wall thickness. Right  ventricular systolic function is moderately reduced. There is moderately elevated pulmonary artery systolic pressure. The tricuspid regurgitant velocity is 3.30 m/s, and with an assumed right atrial pressure of 10 mmHg, the estimated right ventricular systolic pressure is 53.6 mmHg. Left Atrium: Left atrial size was moderately dilated. Right Atrium: Right atrial size was moderately dilated. Pericardium: There is no evidence of pericardial effusion. Mitral Valve: The mitral valve is normal in structure. Moderate to severe mitral valve regurgitation. No evidence of mitral valve stenosis. MV peak gradient, 3.6 mmHg. The mean mitral valve gradient is 2.0 mmHg. Tricuspid Valve: The tricuspid valve is normal in structure. Tricuspid valve regurgitation is not demonstrated. No evidence of tricuspid stenosis. Aortic Valve: The aortic valve is calcified. There is moderate calcification of the aortic valve. Aortic valve regurgitation is moderate. Aortic regurgitation PHT measures 386 msec. Aortic valve sclerosis/calcification is present, without any evidence of  aortic stenosis. Aortic valve mean gradient measures 3.0 mmHg. Aortic valve peak gradient measures 5.7 mmHg. Aortic valve area, by VTI measures 1.89 cm. Pulmonic Valve: The pulmonic valve was normal in structure. Pulmonic valve regurgitation is moderate. No evidence of pulmonic stenosis. Aorta: The aortic root is normal in size and structure. Venous: The inferior vena cava is normal in size with greater than 50% respiratory variability, suggesting right atrial pressure of 3 mmHg. IAS/Shunts: No atrial level shunt detected by color flow Doppler. Additional Comments: 3D was performed not requiring image post processing on an independent workstation and was indeterminate. There is a moderate pleural effusion in the left lateral region.   LEFT VENTRICLE PLAX 2D                        Biplane EF (MOD) LVIDd:         5.20 cm         LV Biplane EF:   Left LVIDs:         4.70 cm                          ventricular LV PW:         1.20 cm                          ejection LV IVS:        1.30 cm                          fraction by LVOT diam:     2.00 cm                          2D MOD LV SV:         30                               biplane is LV SV Index:   15                               21.7 %. LVOT Area:     3.14 cm                                Diastology  LV e' medial:    4.26 cm/s LV Volumes (MOD)               LV E/e' medial:  16.6 LV vol d, MOD    190.0 ml      LV e' lateral:   12.40 cm/s A2C:                           LV E/e' lateral: 5.7 LV vol d, MOD    137.0 ml A4C:                           2D Longitudinal LV vol s, MOD    144.0 ml      Strain A2C:                           2D Strain GLS   -6.8 % LV vol s, MOD    103.0 ml      Avg: A4C: LV SV MOD A2C:   46.0 ml LV SV MOD A4C:   137.0 ml LV SV MOD BP:    37.6 ml RIGHT VENTRICLE RV Basal diam:  5.10 cm RV Mid diam:    4.10 cm RV S prime:     7.07 cm/s TAPSE (M-mode): 1.2 cm LEFT ATRIUM              Index        RIGHT ATRIUM           Index LA diam:        5.60 cm  2.72 cm/m   RA Area:     30.90 cm LA Vol (A2C):   140.0 ml 67.92 ml/m  RA Volume:   131.00 ml 63.55 ml/m LA Vol (A4C):   141.0 ml 68.41 ml/m LA Biplane Vol: 142.0 ml 68.89 ml/m  AORTIC VALVE                    PULMONIC VALVE AV Area (Vmax):    1.98 cm     PV Vmax:          0.90 m/s AV Area (Vmean):   1.95 cm     PV Peak grad:     3.2 mmHg AV Area (VTI):     1.89 cm     PR End Diast Vel: 19.89 msec AV Vmax:           119.00 cm/s  RVOT Peak grad:   1 mmHg AV Vmean:          72.900 cm/s AV VTI:            0.160 m AV Peak Grad:      5.7 mmHg AV Mean Grad:      3.0 mmHg LVOT Vmax:         75.00 cm/s LVOT Vmean:        45.300 cm/s LVOT VTI:          0.096 m LVOT/AV VTI ratio: 0.60 AI PHT:             386 msec  AORTA Ao Root diam: 2.70 cm Ao Asc diam:  3.20 cm MITRAL VALVE               TRICUSPID VALVE MV Area (PHT): 3.12 cm    TR Peak grad:   43.6 mmHg MV Area VTI:   2.94 cm  TR Vmax:        330.00 cm/s MV Peak grad:  3.6 mmHg MV Mean grad:  2.0 mmHg    SHUNTS MV Vmax:       0.95 m/s    Systemic VTI:  0.10 m MV Vmean:      61.8 cm/s   Systemic Diam: 2.00 cm MV Decel Time: 243 msec MV E velocity: 70.70 cm/s Evalene Lunger MD Electronically signed by Evalene Lunger MD Signature Date/Time: 06/19/2024/8:15:28 AM    Final    US  Abdomen Complete Result Date: 06/18/2024 CLINICAL DATA:  Ascites. EXAM: ABDOMEN ULTRASOUND COMPLETE COMPARISON:  CT with contrast 07/03/2022. FINDINGS: Gallbladder: Gallbladder is distended. There is a small lobular area dependently measured by the sonographer 4 mm which is not shadow. This could be mass effect from adjacent structures versus a small polyp. No clear shadowing stones, wall thickening. No reported Murphy's sign. Common bile duct: Diameter: 7 mm. Liver: Multiple anechoic foci identified in the liver consistent with numerous cysts. Example left hepatic lobe measures up to 3.5 cm. Smaller foci measure 14 mm. There is larger right-sided focus peripherally measuring 4.9 cm. Portal vein is patent on color Doppler imaging with normal direction of blood flow towards the liver. IVC: No abnormality visualized. Pancreas: Obscured by overlapping bowel gas and soft tissue. Spleen: Size and appearance within normal limits. Poorly seen with overlapping bowel gas and soft tissue. Right Kidney: Length: 11 cm.  Severe collecting system dilatation. Left Kidney: Length: 10.4 cm. Severe collecting system dilatation. This was seen on the CT scan of 07/03/2022. Abdominal aorta: No aneurysm visualized. Other findings: Bladder has a lobular contour which are brachial a shins and is distended. Bladder volume calculated at 625 cc. Postvoid bladder residual of 471 cc. IMPRESSION: Bilateral severe  collecting system dilatation. Left-sided dilatation was seen on the remote CT scan. The right however is new. Please correlate for history otherwise recommend further evaluation such as CT scan. Bladder wall thickening which are back elation. Large postvoid bladder residual. Numerous hepatic cysts. Question small gallbladder polyp.  No biliary ductal dilatation. Limited visualization of several structures with overlapping bowel gas and soft tissue. Electronically Signed   By: Ranell Bring M.D.   On: 06/18/2024 10:38   DG Chest 2 View Result Date: 06/17/2024 CLINICAL DATA:  Shortness of breath EXAM: CHEST - 2 VIEW COMPARISON:  06/10/2024 FINDINGS: Mild cardiomegaly with trace pleural effusions. Slight central congestion. No consolidation. Aortic atherosclerosis. No pneumothorax. Multiple mild wedging deformities of mid to lower thoracic vertebra. IMPRESSION: Mild cardiomegaly with trace pleural effusions and slight central congestion. Electronically Signed   By: Luke Bun M.D.   On: 06/17/2024 17:30    Cardiac Studies 2D echo 06/18/2024 1. Left ventricular ejection fraction, by estimation, is 20 to 25%. Left  ventricular ejection fraction by 2D MOD biplane is 21.7 %. The left  ventricle has severely decreased function. The left ventricle demonstrates  global hypokinesis. There is mild  left ventricular hypertrophy. Left ventricular diastolic parameters are  indeterminate. The average left ventricular global longitudinal strain is  -6.8 %.   2. Right ventricular systolic function is moderately reduced. The right  ventricular size is mildly enlarged. There is moderately elevated  pulmonary artery systolic pressure. The estimated right ventricular  systolic pressure is 53.6 mmHg.   3. Left atrial size was moderately dilated.   4. Right atrial size was moderately dilated.   5. Moderate pleural effusion in the left lateral region.   6. The mitral valve  is normal in structure. Moderate to severe  mitral  valve regurgitation. No evidence of mitral stenosis.   7. The aortic valve is calcified. There is moderate calcification of the  aortic valve. Aortic valve regurgitation is moderate. Aortic valve  sclerosis/calcification is present, without any evidence of aortic  stenosis. Aortic valve mean gradient measures  3.0 mmHg.   8. Pulmonic valve regurgitation is moderate.   9. The inferior vena cava is normal in size with greater than 50%  respiratory variability, suggesting right atrial pressure of 3 mmHg.    Patient Profile   78 y.o. male with a past medical history of paroxysmal atrial fibrillation, prior MR/TR, hypertension, hyperlipidemia borderline obesity, diverticulosis, GERD, [place] trauma with right facial droop, who is being seen and evaluated for new onset acute systolic congestive heart failure  Assessment & Plan  Acute HFrEF - Presented with shortness of breath and worsening peripheral edema -BNP greater than 4500 -Continued on furosemide  40 mg IV twice daily --3.4 L output in the last 24 hours -Strict I's and O's and daily weights -Heart failure education -Apixaban  discontinued and started on heparin  infusion for right and left heart catheterization the beginning of the week -Initiate GDMT and escalate as tolerated by blood pressure and kidney function - Started on losartan  12.5 mg daily - Consider addition of spironolactone tomorrow  Paroxysmal atrial fibrillation - Maintaining sinus rhythm -Apixaban  discontinued and started on heparin  infusion for CHA2DS2-VASc of at least 4 for stroke prophylaxis -Currently continued on metoprolol  25 mg twice daily, will need to be consolidated to Toprol -XL prior to discharge -Continue with telemetry monitoring  Hypokalemia/hypomagnesemia -Serum potassium 3.2 magnesium  1.6 - Recommend keeping potassium greater than 4 less than 5 magnesium  2 -Supplementation ordered -Daily BMP -Monitor/trend/replete electrolytes as  needed  Ascites/bilateral hydronephrosis -Patient presented with a 24 pound weight gain -Endorsed progressive difficulty voiding over the last month -Abdominal ultrasound completed revealing severe bilateral dilatation collecting system - Large postvoid bladder residual Foley catheter remains in place- -urology following  New left bundle branch block -Noted on EKG from May 2025 - Previously had declined ETT when on flecainide  - Would benefit from ischemic workup with acute HFrEF  Alcohol abuse -Cessation recommended  Chronic hyponatremia -Serum sodium 129 -Limit free water  intake -Daily BMP   For questions or updates, please contact [facility] Please consult [email or web address] for contact info under     Signed, Toshie Demelo, NP  06/19/2024, 9:03 AM

## 2024-06-19 NOTE — Progress Notes (Addendum)
 PHARMACY - ANTICOAGULATION CONSULT NOTE  Pharmacy Consult for Heparin  Infusion  Indication: atrial fibrillation  Allergies  Allergen Reactions   Sulfa Antibiotics Other (See Comments)    Unknown/childhood allergy unknown    Patient Measurements: Height: 5' 11 (180.3 cm) Weight: 86.3 kg (190 lb 4.1 oz) IBW/kg (Calculated) : 75.3 HEPARIN  DW (KG): 86.1  Vital Signs: Temp: 97.7 F (36.5 C) (06/22 0800) BP: 113/86 (06/22 0800) Pulse Rate: 77 (06/22 0800)  Labs: Recent Labs    06/17/24 1421 06/18/24 0053 06/19/24 0823  HGB 12.2* 11.7* 11.2*  HCT 34.5* 34.0* 32.0*  PLT 212 197 162  LABPROT 18.8*  --   --   INR 1.6*  --   --   CREATININE 1.18 1.25* 1.07  TROPONINIHS 34*  --   --     Estimated Creatinine Clearance: 60.6 mL/min (by C-G formula based on SCr of 1.07 mg/dL).   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   First degree AV block    GERD (gastroesophageal reflux disease)    GI symptom    had nausea / vomited once / frequent stools / getting better   Hyperlipidemia    Hypertension    Hypothyroidism    Mild carotid artery disease (HCC)    Mitral regurgitation    Mood changes    PAF (paroxysmal atrial fibrillation) (HCC)    Pneumonia    hx of   Tricuspid regurgitation    Tricuspid regurgitation     Medications:  Medications Prior to Admission  Medication Sig Dispense Refill Last Dose/Taking   acetaminophen  (TYLENOL ) 500 MG tablet Take 1,500 mg by mouth every 6 (six) hours as needed for moderate pain (pain score 4-6).   Taking As Needed   apixaban  (ELIQUIS ) 5 MG TABS tablet Take 1  tablet (5 mg total) by mouth 2 (two) times daily. 180 tablet 1 06/17/2024 at 10:30 AM   atorvastatin  (LIPITOR) 20 MG tablet Take 1 tablet (20 mg total) by mouth daily. 90 tablet 1 06/17/2024   Cholecalciferol  (VITAMIN D ) 2000 UNITS CAPS Take 2,000 Units by mouth every evening.    06/17/2024   furosemide  (LASIX ) 40 MG tablet Take 1 tablet (40 mg total) by mouth daily. 30 tablet 3 06/17/2024   levothyroxine  (SYNTHROID ) 100 MCG tablet Take 1 tablet (100 mcg total) by mouth daily. 90 tablet 2 06/17/2024   losartan  (COZAAR ) 50 MG tablet Take 1 tablet (50 mg total) by mouth daily. 90 tablet 3 06/17/2024   magnesium  oxide (MAG-OX) 400 (240 Mg) MG tablet TAKE 1 TABLET TWICE DAILY . 180 tablet 3 06/16/2024   metoprolol  tartrate (LOPRESSOR ) 25 MG tablet Take 1 tablet (25 mg total) by mouth 2 (two) times daily. 180 tablet 2 06/17/2024   Multiple Vitamin (MULTIVITAMIN WITH MINERALS) TABS tablet Take 1 tablet by mouth daily.    06/17/2024   Multiple Vitamins-Minerals (PRESERVISION AREDS 2) CAPS Take 1 capsule by mouth 2 (two) times daily.    06/17/2024   pantoprazole  (PROTONIX ) 40 MG tablet Take 1 tablet (40 mg total) by mouth daily. 90 tablet 3 06/17/2024   potassium  chloride SA (KLOR-CON  M20) 20 MEQ tablet Take 2 tablets (40 mEq total) by mouth daily. 180 tablet 3 06/17/2024   venlafaxine  XR (EFFEXOR -XR) 75 MG 24 hr capsule TAKE 1 CAPSULE EVERY DAY 90 capsule 3 06/17/2024   amoxicillin (AMOXIL) 500 MG capsule Take 4 capsules by mouth as directed. Before dental appointment       Assessment: 78 yo male with PMH of A. Fib, HTN, HFrEF, obesity, and GERD. Patient was on apixaban  for A. Fib and stroke prophylaxis, but it has been discontinued for right and left heart catheterization planned this week. Pharmacy has been consulted for heparin  infusion dosing and monitoring.  Last dose of apixaban  6/22 @ 0836  Goal of Therapy:  Heparin  level 0.3-0.7 units/ml aPTT 66-102 seconds Monitor platelets by anticoagulation protocol:  Yes   Plan:  Per our [place] anticoagulation dosing guidelines- will plan to start heparin  infusion when the next dose of apixaban  would have been scheduled with no bolus.  Baseline HL and APTT ordered prior to heparin  infusion start time  Start heparin  infusion at  1300 units/hr @ 2030 6/22  Will need to adjust heparin  infusion based on APTT levels until APTT and HLs correlate  Check anti-Xa level and APTT in 8 hours and daily while on heparin  Continue to monitor H&H and platelets  Estill CHRISTELLA Lutes, PharmD, BCPS Clinical Pharmacist 06/19/2024 10:41 AM

## 2024-06-20 ENCOUNTER — Ambulatory Visit: Payer: Medicare (Managed Care) | Admitting: Family Medicine

## 2024-06-20 DIAGNOSIS — I5021 Acute systolic (congestive) heart failure: Secondary | ICD-10-CM | POA: Diagnosis not present

## 2024-06-20 DIAGNOSIS — I509 Heart failure, unspecified: Secondary | ICD-10-CM | POA: Diagnosis not present

## 2024-06-20 DIAGNOSIS — I480 Paroxysmal atrial fibrillation: Secondary | ICD-10-CM | POA: Diagnosis not present

## 2024-06-20 LAB — CBC
HCT: 31.8 % — ABNORMAL LOW (ref 39.0–52.0)
Hemoglobin: 11.3 g/dL — ABNORMAL LOW (ref 13.0–17.0)
MCH: 35.1 pg — ABNORMAL HIGH (ref 26.0–34.0)
MCHC: 35.5 g/dL (ref 30.0–36.0)
MCV: 98.8 fL (ref 80.0–100.0)
Platelets: 178 10*3/uL (ref 150–400)
RBC: 3.22 MIL/uL — ABNORMAL LOW (ref 4.22–5.81)
RDW: 15 % (ref 11.5–15.5)
WBC: 5.5 10*3/uL (ref 4.0–10.5)
nRBC: 0 % (ref 0.0–0.2)

## 2024-06-20 LAB — APTT
aPTT: 145 s — ABNORMAL HIGH (ref 24–36)
aPTT: 84 s — ABNORMAL HIGH (ref 24–36)
aPTT: 58 s — ABNORMAL HIGH (ref 24–36)

## 2024-06-20 LAB — BASIC METABOLIC PANEL WITH GFR
Anion gap: 8 (ref 5–15)
BUN: 18 mg/dL (ref 8–23)
CO2: 26 mmol/L (ref 22–32)
Calcium: 8.7 mg/dL — ABNORMAL LOW (ref 8.9–10.3)
Chloride: 99 mmol/L (ref 98–111)
Creatinine, Ser: 0.95 mg/dL (ref 0.61–1.24)
GFR, Estimated: >60 mL/min (ref >60)
Glucose, Bld: 107 mg/dL — ABNORMAL HIGH (ref 70–99)
Potassium: 4 mmol/L (ref 3.5–5.1)
Sodium: 133 mmol/L — ABNORMAL LOW (ref 135–145)

## 2024-06-20 LAB — HEPARIN LEVEL (UNFRACTIONATED): Heparin Unfractionated: >1.1 [IU]/mL — ABNORMAL HIGH (ref 0.30–0.70)

## 2024-06-20 LAB — MAGNESIUM: Magnesium: 1.7 mg/dL (ref 1.7–2.4)

## 2024-06-20 MED ORDER — SODIUM CHLORIDE 0.9 % IV SOLN: INTRAVENOUS | Status: DC

## 2024-06-20 MED ORDER — SODIUM CHLORIDE 0.9% FLUSH: 10.0000 mL | INTRAVENOUS | Status: DC | PRN

## 2024-06-20 MED ORDER — ASPIRIN 81 MG PO CHEW
81.0000 mg | CHEWABLE_TABLET | ORAL | Status: AC
  Administered 2024-06-21: 81 mg via ORAL
  Filled 2024-06-20: qty 1

## 2024-06-20 MED ORDER — SPIRONOLACTONE 12.5 MG HALF TABLET
12.5000 mg | ORAL_TABLET | Freq: Every day | ORAL | Status: DC
  Administered 2024-06-20 – 2024-06-21 (×2): 12.5 mg via ORAL
  Filled 2024-06-20 (×2): qty 1

## 2024-06-20 MED ORDER — HEPARIN (PORCINE) 25000 UT/250ML-% IV SOLN
1000.0000 [IU]/h | INTRAVENOUS | Status: DC
  Administered 2024-06-20: 1000 [IU]/h via INTRAVENOUS
  Filled 2024-06-20: qty 250

## 2024-06-20 MED ORDER — SODIUM CHLORIDE 0.9% FLUSH
10.0000 mL | Freq: Two times a day (BID) | INTRAVENOUS | Status: DC
  Administered 2024-06-21 – 2024-06-23 (×6): 10 mL

## 2024-06-20 NOTE — Progress Notes (Signed)
 PROGRESS NOTE  Eugene White  FMW:[id number] DOB: [birth year] DOA: 06/17/2024 PCP: Myrla Jon HERO, MD  Consultants  Brief Narrative: 78 y.o. male with a PMH significant for HTN, obesity, pAF on Eliquis , hypothyroidism who presented with SOB, swelling, fatigue.  Patient lost about 50 lbs over the last year, but in the last month, has gained 20 lbs, and during the same time, has noticed swelling in his abdomen and legs.  Finally, he went to PCP's office, we here he was noted to have BNP >4000, and started on Lasix .  He trialed this at home and had marginal improvement, but was still too SOB to walk across the room so he came to the ER.  Started on IV Lasix  and [facility] called to admit.  Assessment & Plan: Acute CHF (congestive heart failure) (HCC) - Breathing has improved somewhat and swelling also improving.  He has not been out of bed. - Echo this admission showed EF of 25%.  Noted decrease from EF several years ago.  Continue IV Lasix .  Down 7.25 L thus far. - Cardiology following.  Has started low-dose ARB and he is also on metoprolol .  Start es prolactin today. - Tentative plan is for right/left heart cath tomorrow.  N.p.o. after midnight tonight.  Cardiomyopathy: - Agree with cessation of alcohol and right/left heart cath, planned for tomorrow tentatively    Paroxysmal atrial fibrillation (HCC) In sinus rhythm here - Cardiology has switched to heparin  in prep for cardiac catheterization. - Added metoprolol  succinate rather than tartrate. - Switch back to Eliquis  prior to DC  BPH: - Foley catheter, placed by urology - Much better urine output since placement. - Urology started Flomax , will continue.  Daily alcohol use: - No signs of withdrawal, will closely monitor for this. - Recommend cessation in light of cardiomyopathy above  Elevated Cr: - not quite to level of AKI--> now downtrending since placement of Foley.  Likely obstructive - Back to his baseline creatinine    Ascites - likely cardiogenic.  But he reports daily alcohol use. - numerous liver cysts noted without frank fibrosis/cirrhosis noted on liver ultrasound   Hyponatremia - Much improved today with diuresis.  Secondary to volume overload - Continue diuresis, limit free water  intake  Hypokalemia/hypomagnesemia: - Will continue to trend in light of ongoing diuresis.  Replaced today.  Macrocytic anemia: - reports daily EtOH usage - trend Hgb.  No bleeding   Obesity (BMI 30-39.9) History of class 1 obesity, lost 50 pounds last year, gained 20 since start of June.     Essential (primary) hypertension Blood pressure normal - Continue metoprolol  - Hold losartan  - Lasix    Hypothyroidism TSH minimally elevated 1 week ago - Continue home levothyroxine   DVT prophylaxis:  heparin   Code Status:   Code Status: Full Code Family Communication: spoke with daughter by phone Level of care: Telemetry Cardiac Status is: Inpatient Dispo:  pending further diuresis and right/left heart cath  Consults called: Urology, cardiology   Subjective: Patient awake and alert, breathing better.  Still O2 dependent.  Has noted vast improvement leg swelling.  No complaints or concerns today.  Objective: Vitals:   06/20/24 0355 06/20/24 0439 06/20/24 0832 06/20/24 1222  BP:  (!) 122/90 131/85 124/87  Pulse:  94 81 98  Resp:  18    Temp:  98.2 F (36.8 C) (!) 97.4 F (36.3 C) (!) 97.5 F (36.4 C)  TempSrc:  Oral    SpO2:   100% 100%  Weight: 85.9 kg  Height:        Intake/Output Summary (Last 24 hours) at 06/20/2024 1517 Last data filed at 06/20/2024 1300 Gross per 24 hour  Intake 108.64 ml  Output 3950 ml  Net -3841.36 ml   Filed Weights   06/18/24 0500 06/19/24 0500 06/20/24 0355  Weight: 86 kg 86.3 kg 85.9 kg   Body mass index is 26.41 kg/m.  Gen: 78 y.o. male in no apparent distress.  Nontoxic HEENT:   Right eyelid everted and with some Right sided facial droop, both being  chronic  Pulm: Non-labored breathing.  Clear to auscultation bilaterally.  CV: Regular rate and rhythm. JVP to mid-neck GI: Abdomen soft, non-tender, non-distended.  With persistent abd wall edema BL flanks Ext: Warm, no deformities, +2 BL LE edema to mid thighs, better than admit.  Skin: No rashes, lesions Neuro: Alert and oriented x 4. Muscle tone normal, moving all 4 limbs symmetrically Psych: Calm  Judgement and insight appear normal. Mood & affect appropriate.     I have personally reviewed the following labs and images: CBC: Recent Labs  Lab 06/17/24 1421 06/18/24 0053 06/19/24 0823 06/20/24 0234  WBC 4.8 5.1 5.8 5.5  HGB 12.2* 11.7* 11.2* 11.3*  HCT 34.5* 34.0* 32.0* 31.8*  MCV 98.9 100.3* 100.3* 98.8  PLT 212 197 162 178   BMP &GFR Recent Labs  Lab 06/17/24 1421 06/18/24 0053 06/19/24 0823 06/20/24 0234  NA 129* 129* 134* 133*  K 4.2 4.0 3.2* 4.0  CL 98 98 101 99  CO2 20* 20* 25 26  GLUCOSE 123* 109* 108* 107*  BUN 21 24* 21 18  CREATININE 1.18 1.25* 1.07 0.95  CALCIUM  8.9 8.8* 8.4* 8.7*  MG  --   --  1.6* 1.7   Estimated Creatinine Clearance: 68.3 mL/min (by C-G formula based on SCr of 0.95 mg/dL). Liver & Pancreas: Recent Labs  Lab 06/17/24 1421 06/19/24 0823  AST 30 19  ALT 18 14  ALKPHOS 58 49  BILITOT 1.3* 1.0  PROT 5.8* 5.2*  ALBUMIN 3.7 3.4*   No results for input(s): LIPASE, AMYLASE in the last 168 hours. No results for input(s): AMMONIA in the last 168 hours. Diabetic: No results for input(s): HGBA1C in the last 72 hours. No results for input(s): GLUCAP in the last 168 hours. Cardiac Enzymes: No results for input(s): CKTOTAL, CKMB, CKMBINDEX, TROPONINI in the last 168 hours. No results for input(s): PROBNP in the last 8760 hours. Coagulation Profile: Recent Labs  Lab 06/17/24 1421  INR 1.6*   Thyroid  Function Tests: No results for input(s): TSH, T4TOTAL, FREET4, T3FREE, THYROIDAB in the last 72  hours. Lipid Profile: No results for input(s): CHOL, HDL, LDLCALC, TRIG, CHOLHDL, LDLDIRECT in the last 72 hours. Anemia Panel: No results for input(s): VITAMINB12, FOLATE, FERRITIN, TIBC, IRON, RETICCTPCT in the last 72 hours. Urine analysis:    Component Value Date/Time   COLORURINE YELLOW (A) 06/18/2022 1655   APPEARANCEUR Hazy (A) 09/17/2022 1555   LABSPEC 1.018 06/18/2022 1655   PHURINE 6.0 06/18/2022 1655   GLUCOSEU Negative 09/17/2022 1555   HGBUR NEGATIVE 06/18/2022 1655   BILIRUBINUR Negative 09/17/2022 1555   KETONESUR 5 (A) 06/18/2022 1655   PROTEINUR 2+ (A) 09/17/2022 1555   PROTEINUR 100 (A) 06/18/2022 1655   UROBILINOGEN 0.2 06/26/2022 1608   UROBILINOGEN 0.2 01/03/2015 1433   NITRITE Negative 09/17/2022 1555   NITRITE NEGATIVE 06/18/2022 1655   LEUKOCYTESUR Trace (A) 09/17/2022 1555   LEUKOCYTESUR NEGATIVE 06/18/2022 1655   Sepsis Labs: Invalid  input(s): PROCALCITONIN, LACTICIDVEN  Microbiology: No results found for this or any previous visit (from the past 240 hours).  Radiology Studies: No results found.   Scheduled Meds:  atorvastatin   20 mg Oral Daily   Chlorhexidine  Gluconate Cloth  6 each Topical Daily   furosemide   40 mg Intravenous BID   levothyroxine   100 mcg Oral Q0600   losartan   12.5 mg Oral Daily   metoprolol  succinate  25 mg Oral QPM   pantoprazole   40 mg Oral Daily   potassium chloride   40 mEq Oral BID   spironolactone  12.5 mg Oral Daily   tamsulosin   0.4 mg Oral QPC supper   venlafaxine  XR  75 mg Oral Q breakfast   Continuous Infusions:  heparin  1,000 Units/hr (06/20/24 0622)     LOS: 3 days   35 minutes with more than 50% spent in reviewing records, counseling patient/family and coordinating care.  Reyes VEAR Gaw, MD [facility] [email or web address] 06/20/2024, 3:17 PM

## 2024-06-20 NOTE — Progress Notes (Signed)
 Heart Failure Stewardship Pharmacy Note  PCP: Myrla Jon HERO, MD PCP-Cardiologist: Stanly DELENA Leavens, MD  HPI: Eugene White is a 78 y.o. male with HTN, obesity, pAF on Eliquis , hypothyroidism who presented with shortness of breath and 20 lb weight gain over several months. On admission, BNP was >4500, HS-troponin was 34. Chest x-ray noted trace pleural effusions and slight central congestion. ECG noted new LBBB. Cardiology planning [facility] tomorrow.   Pertinent cardiac history: Echo in 09/2017 noted LVEF of 55-60%, mild LVH. Echo in 12/2020 showed LVEF 55-60%, mild MR. Echo 05/2024 showed LVEF down to 20-25%, mild LVH, moderately reduced RV function, moderate biatrial dilation, moderate to severe MR.   Pertinent Lab Values: Creatinine, Ser  Date Value Ref Range Status  06/20/2024 0.95 0.61 - 1.24 mg/dL Final   BUN  Date Value Ref Range Status  06/20/2024 18 8 - 23 mg/dL Final  93/86/7974 15 8 - 27 mg/dL Final   Potassium  Date Value Ref Range Status  06/20/2024 4.0 3.5 - 5.1 mmol/L Final   Sodium  Date Value Ref Range Status  06/20/2024 133 (L) 135 - 145 mmol/L Final  06/10/2024 126 (L) 134 - 144 mmol/L Final   B Natriuretic Peptide  Date Value Ref Range Status  06/17/2024 >4,500.0 (H) 0.0 - 100.0 pg/mL Final    Comment:    Performed at [facility], [address] [place], [place] [address]   Magnesium   Date Value Ref Range Status  06/20/2024 1.7 1.7 - 2.4 mg/dL Final    Comment:    Performed at [facility], [address] [place], [place] [address]   Hgb A1c MFr Bld  Date Value Ref Range Status  04/07/2024 4.6 (L) 4.8 - 5.6 % Final    Comment:             Prediabetes: 5.7 - 6.4          Diabetes: >6.4          Glycemic control for adults with diabetes: <7.0    TSH  Date Value Ref Range Status  06/10/2024 5.190 (H) 0.450 - 4.500 uIU/mL Final    Vital Signs:  Temp:  [97.4 F (36.3 C)-98.2 F (36.8 C)] 97.8 F (36.6 C)  (06/23 1544) Pulse Rate:  [81-100] 88 (06/23 1544) Cardiac Rhythm: Normal sinus rhythm;Bundle branch block (06/23 0704) Resp:  [17-18] 18 (06/23 0439) BP: (118-131)/(85-91) 118/87 (06/23 1544) SpO2:  [96 %-100 %] 100 % (06/23 1544) Weight:  [85.9 kg (189 lb 6 oz)] 85.9 kg (189 lb 6 oz) (06/23 0355)  Intake/Output Summary (Last 24 hours) at 06/20/2024 1558 Last data filed at 06/20/2024 1300 Gross per 24 hour  Intake 108.64 ml  Output 3950 ml  Net -3841.36 ml    Current Heart Failure Medications:  Loop diuretic: furosemide  40 mg IV BID Beta-Blocker: metoprolol  succinate 25 mg daily ACEI/ARB/ARNI: losartan  12.5 mg daily MRA: spironolactone 25 mg daily SGLT2i: none Other: none  Prior to admission Heart Failure Medications:  Loop diuretic: furosemide  40 mg daily Beta-Blocker: metoprolol  tartrate 25 mg BID ACEI/ARB/ARNI: losartan  50 mg daily MRA: none SGLT2i: none Other: none  Assessment: 1. Acute systolic heart failure (LVEF 20-25%) with moderate RV dysfunction, due to unknown etiology. NYHA class III symptoms.  -Symptoms: Reports feeling much better this morning. Shortness of breath and LEE are improving. -Volume: Appears hypervolemic. LEE noted and JVP up. Continue furosemide  40 mg IV BID. Dry weight is ~174 lbs, currently 189 lbs. -Hemodynamics: BP is stable.  HR 80s. -BB: Continue metoprolol  succinate 25 mg daily. Can continue titration after reaching euvolemia pending RHC.  -ACEI/ARB/ARNI: Losartan  reduced from home dose to 12.5 mg daily due to lower BP. -MRA: Consider adding spironolactone 12.5 mg daily -SGLT2i: Consider adding SGLT2i after cath. Will check cost.   Plan: 1) Medication changes recommended at this time: -Spironolactone 12.5 mg daily  2) Patient assistance: -Pending  3) Education: - Patient has been educated on current HF medications and potential additions to HF medication regimen - Patient verbalizes understanding that over the next few months, these  medication doses may change and more medications may be added to optimize HF regimen - Patient has been educated on basic disease state pathophysiology and goals of therapy  Medication Assistance / Insurance Benefits Check: Does the patient have prescription insurance?    Type of insurance plan:  Does the patient qualify for medication assistance through manufacturers or grants? Pending    Outpatient Pharmacy: Prior to admission outpatient pharmacy: [facility]      Please do not hesitate to reach out with questions or concerns,  Jaun Bash, PharmD, CPP, BCPS Heart Failure Pharmacist  Phone - [phone] 06/20/2024 3:58 PM

## 2024-06-20 NOTE — Progress Notes (Signed)
 PHARMACY - ANTICOAGULATION CONSULT NOTE  Pharmacy Consult for Heparin  Infusion  Indication: atrial fibrillation  Allergies  Allergen Reactions   Sulfa Antibiotics Other (See Comments)    Unknown/childhood allergy unknown    Patient Measurements: Height: 5' 11 (180.3 cm) Weight: 85.9 kg (189 lb 6 oz) IBW/kg (Calculated) : 75.3 HEPARIN  DW (KG): 86.1  Vital Signs: Temp: 98 F (36.7 C) (06/23 2005) BP: 106/85 (06/23 2005) Pulse Rate: 101 (06/23 2005)  Labs: Recent Labs    06/18/24 0053 06/19/24 0823 06/19/24 1759 06/19/24 1759 06/20/24 0234 06/20/24 1425 06/20/24 2238  HGB 11.7* 11.2*  --   --  11.3*  --   --   HCT 34.0* 32.0*  --   --  31.8*  --   --   PLT 197 162  --   --  178  --   --   APTT  --   --  35   < > 145* 84* 58*  HEPARINUNFRC  --   --  >1.10*  --  >1.10*  --   --   CREATININE 1.25* 1.07  --   --  0.95  --   --    < > = values in this interval not displayed.    Estimated Creatinine Clearance: 68.3 mL/min (by C-G formula based on SCr of 0.95 mg/dL).   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   First degree AV block    GERD (gastroesophageal reflux disease)    GI symptom    had nausea / vomited once / frequent stools / getting better   Hyperlipidemia    Hypertension    Hypothyroidism    Mild carotid artery disease (HCC)    Mitral regurgitation    Mood changes    PAF (paroxysmal atrial fibrillation) (HCC)    Pneumonia    hx of   Tricuspid regurgitation    Tricuspid regurgitation     Medications:  Medications Prior to Admission  Medication Sig Dispense Refill Last  Dose/Taking   acetaminophen  (TYLENOL ) 500 MG tablet Take 1,500 mg by mouth every 6 (six) hours as needed for moderate pain (pain score 4-6).   Taking As Needed   apixaban  (ELIQUIS ) 5 MG TABS tablet Take 1 tablet (5 mg total) by mouth 2 (two) times daily. 180 tablet 1 06/17/2024 at 10:30 AM   atorvastatin  (LIPITOR) 20 MG tablet Take 1 tablet (20 mg total) by mouth daily. 90 tablet 1 06/17/2024   Cholecalciferol  (VITAMIN D ) 2000 UNITS CAPS Take 2,000 Units by mouth every evening.    06/17/2024   furosemide  (LASIX ) 40 MG tablet Take 1 tablet (40 mg total) by mouth daily. 30 tablet 3 06/17/2024   levothyroxine  (SYNTHROID ) 100 MCG tablet Take 1 tablet (100 mcg total) by mouth daily. 90 tablet 2 06/17/2024   losartan  (COZAAR ) 50 MG tablet Take 1 tablet (50 mg total) by mouth daily. 90 tablet 3 06/17/2024   magnesium  oxide (MAG-OX) 400 (240 Mg) MG tablet TAKE 1 TABLET TWICE DAILY . 180 tablet 3 06/16/2024   metoprolol  tartrate (LOPRESSOR ) 25 MG tablet Take 1 tablet (25  mg total) by mouth 2 (two) times daily. 180 tablet 2 06/17/2024   Multiple Vitamin (MULTIVITAMIN WITH MINERALS) TABS tablet Take 1 tablet by mouth daily.    06/17/2024   Multiple Vitamins-Minerals (PRESERVISION AREDS 2) CAPS Take 1 capsule by mouth 2 (two) times daily.    06/17/2024   pantoprazole  (PROTONIX ) 40 MG tablet Take 1 tablet (40 mg total) by mouth daily. 90 tablet 3 06/17/2024   potassium chloride  SA (KLOR-CON  M20) 20 MEQ tablet Take 2 tablets (40 mEq total) by mouth daily. 180 tablet 3 06/17/2024   venlafaxine  XR (EFFEXOR -XR) 75 MG 24 hr capsule TAKE 1 CAPSULE EVERY DAY 90 capsule 3 06/17/2024   amoxicillin (AMOXIL) 500 MG capsule Take 4 capsules by mouth as directed. Before dental appointment       Assessment: 78 yo male with PMH of A. Fib, HTN, HFrEF, obesity, and GERD. Patient was on apixaban  for A. Fib and stroke prophylaxis, but it has been discontinued for right and left heart catheterization planned this week. Pharmacy has been  consulted for heparin  infusion dosing and monitoring.  Last dose of apixaban  6/22 @ 0836  Goal of Therapy:  Heparin  level 0.3-0.5 units/ml aPTT 66-85 seconds Monitor platelets by anticoagulation protocol: Yes  6/23 @ 0234:  HL = > 1.10,  aPTT = 145 6/23 ~2200 RN reports slight hematuria, target low end of therapeutic range per Dr. Cleatus 6/23 2238 aPTT 58, subtherapeutic  Plan:  - Increase heparin  infusion to 1100 units/hr - Recheck aPTT/HL at 0730 after rate increase  Will need to adjust heparin  infusion based on APTT levels until APTT and HLs correlate  Check anti-Xa level and APTT in 8 hours and daily while on heparin  Continue to monitor H&H and platelets  Rankin CANDIE Dills, PharmD, [facility] 06/20/2024 11:13 PM

## 2024-06-20 NOTE — Progress Notes (Signed)
 PHARMACY - ANTICOAGULATION CONSULT NOTE  Pharmacy Consult for Heparin  Infusion  Indication: atrial fibrillation  Allergies  Allergen Reactions   Sulfa Antibiotics Other (See Comments)    Unknown/childhood allergy unknown    Patient Measurements: Height: 5' 11 (180.3 cm) Weight: 85.9 kg (189 lb 6 oz) IBW/kg (Calculated) : 75.3 HEPARIN  DW (KG): 86.1  Vital Signs: Temp: 98 F (36.7 C) (06/23 0007) Temp Source: Axillary (06/22 2028) BP: 122/90 (06/23 0439) Pulse Rate: 83 (06/23 0007)  Labs: Recent Labs    06/17/24 1421 06/18/24 0053 06/19/24 0823 06/19/24 1759 06/20/24 0234  HGB 12.2* 11.7* 11.2*  --  11.3*  HCT 34.5* 34.0* 32.0*  --  31.8*  PLT 212 197 162  --  178  APTT  --   --   --  35 145*  LABPROT 18.8*  --   --   --   --   INR 1.6*  --   --   --   --   HEPARINUNFRC  --   --   --  >1.10* >1.10*  CREATININE 1.18 1.25* 1.07  --  0.95  TROPONINIHS 34*  --   --   --   --     Estimated Creatinine Clearance: 68.3 mL/min (by C-G formula based on SCr of 0.95 mg/dL).   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   First degree AV block    GERD (gastroesophageal reflux disease)    GI symptom    had nausea / vomited once / frequent stools / getting better   Hyperlipidemia    Hypertension    Hypothyroidism    Mild carotid artery disease (HCC)    Mitral regurgitation    Mood changes    PAF (paroxysmal atrial fibrillation) (HCC)    Pneumonia    hx of   Tricuspid regurgitation    Tricuspid regurgitation     Medications:  Medications Prior to Admission  Medication Sig Dispense Refill Last  Dose/Taking   acetaminophen  (TYLENOL ) 500 MG tablet Take 1,500 mg by mouth every 6 (six) hours as needed for moderate pain (pain score 4-6).   Taking As Needed   apixaban  (ELIQUIS ) 5 MG TABS tablet Take 1 tablet (5 mg total) by mouth 2 (two) times daily. 180 tablet 1 06/17/2024 at 10:30 AM   atorvastatin  (LIPITOR) 20 MG tablet Take 1 tablet (20 mg total) by mouth daily. 90 tablet 1 06/17/2024   Cholecalciferol  (VITAMIN D ) 2000 UNITS CAPS Take 2,000 Units by mouth every evening.    06/17/2024   furosemide  (LASIX ) 40 MG tablet Take 1 tablet (40 mg total) by mouth daily. 30 tablet 3 06/17/2024   levothyroxine  (SYNTHROID ) 100 MCG tablet Take 1 tablet (100 mcg total) by mouth daily. 90 tablet 2 06/17/2024   losartan  (COZAAR ) 50 MG tablet Take 1 tablet (50 mg total) by mouth daily. 90 tablet 3 06/17/2024   magnesium  oxide (MAG-OX) 400 (240 Mg) MG tablet TAKE 1 TABLET TWICE DAILY . 180 tablet 3 06/16/2024   metoprolol  tartrate (LOPRESSOR ) 25 MG tablet Take 1 tablet (25 mg total) by mouth 2 (two)  times daily. 180 tablet 2 06/17/2024   Multiple Vitamin (MULTIVITAMIN WITH MINERALS) TABS tablet Take 1 tablet by mouth daily.    06/17/2024   Multiple Vitamins-Minerals (PRESERVISION AREDS 2) CAPS Take 1 capsule by mouth 2 (two) times daily.    06/17/2024   pantoprazole  (PROTONIX ) 40 MG tablet Take 1 tablet (40 mg total) by mouth daily. 90 tablet 3 06/17/2024   potassium chloride  SA (KLOR-CON  M20) 20 MEQ tablet Take 2 tablets (40 mEq total) by mouth daily. 180 tablet 3 06/17/2024   venlafaxine  XR (EFFEXOR -XR) 75 MG 24 hr capsule TAKE 1 CAPSULE EVERY DAY 90 capsule 3 06/17/2024   amoxicillin (AMOXIL) 500 MG capsule Take 4 capsules by mouth as directed. Before dental appointment       Assessment: 78 yo male with PMH of A. Fib, HTN, HFrEF, obesity, and GERD. Patient was on apixaban  for A. Fib and stroke prophylaxis, but it has been discontinued for right and left heart catheterization planned this week. Pharmacy has been  consulted for heparin  infusion dosing and monitoring.  Last dose of apixaban  6/22 @ 0836  Goal of Therapy:  Heparin  level 0.3-0.7 units/ml aPTT 66-102 seconds Monitor platelets by anticoagulation protocol: Yes   Plan:  6/23 @ 0234:  HL = > 1.10,  aPTT = 145 - aPTT greatly elevated,  HL elevated from Eliquis  PTA - will hold heparin  drip for 1 hr and restart at 1000 units/hr - recheck aPTT 8 hrs after restart - recheck HL on 6/24 with AM labs  Will need to adjust heparin  infusion based on APTT levels until APTT and HLs correlate  Check anti-Xa level and APTT in 8 hours and daily while on heparin  Continue to monitor H&H and platelets  Trusten Hume D Clinical Pharmacist 06/20/2024 4:50 AM

## 2024-06-20 NOTE — Progress Notes (Addendum)
 Rounding Note   Patient Name: Eugene White Date of Encounter: 06/20/2024  [facility] Cardiologist: Stanly DELENA Leavens, MD   Subjective Patient reports improvements in dyspnea, remains on 1 L supplemental oxygen.  Good urine output yesterday.  Still appears somewhat volume up.  Tentatively plan for [facility] tomorrow.  Patient should be n.p.o. after midnight.  Scheduled Meds:  atorvastatin   20 mg Oral Daily   Chlorhexidine  Gluconate Cloth  6 each Topical Daily   furosemide   40 mg Intravenous BID   levothyroxine   100 mcg Oral Q0600   losartan   12.5 mg Oral Daily   metoprolol  succinate  25 mg Oral QPM   pantoprazole   40 mg Oral Daily   potassium chloride   40 mEq Oral BID   tamsulosin   0.4 mg Oral QPC supper   venlafaxine  XR  75 mg Oral Q breakfast   Continuous Infusions:  heparin  1,000 Units/hr (06/20/24 0622)   PRN Meds: acetaminophen  **OR** acetaminophen , ondansetron  **OR** ondansetron  (ZOFRAN ) IV, zolpidem    Vital Signs  Vitals:   06/20/24 0007 06/20/24 0355 06/20/24 0439 06/20/24 0832  BP: (!) 118/91  (!) 122/90 131/85  Pulse: 83  94 81  Resp: 18  18   Temp: 98 F (36.7 C)  98.2 F (36.8 C) (!) 97.4 F (36.3 C)  TempSrc:   Oral   SpO2: 99%   100%  Weight:  85.9 kg    Height:        Intake/Output Summary (Last 24 hours) at 06/20/2024 1028 Last data filed at 06/20/2024 0622 Gross per 24 hour  Intake 108.64 ml  Output 3300 ml  Net -3191.36 ml      06/20/2024    3:55 AM 06/19/2024    5:00 AM 06/18/2024    5:00 AM  Last 3 Weights  Weight (lbs) 189 lb 6 oz 190 lb 4.1 oz 189 lb 9.5 oz  Weight (kg) 85.9 kg 86.3 kg 86 kg      Telemetry Sinus rhythm - Personally Reviewed  Physical Exam  GEN: No acute distress.   Neck: No JVD Cardiac: RRR, no murmurs, rubs, or gallops.  Respiratory: Clear to auscultation bilaterally. GI: Soft, nontender, non-distended  MS: 1+ LE edema; abdominal distention; No deformity. Neuro:  Nonfocal  Psych: Normal  affect   Labs High Sensitivity Troponin:   Recent Labs  Lab 06/17/24 1421  TROPONINIHS 34*     Chemistry Recent Labs  Lab 06/17/24 1421 06/18/24 0053 06/19/24 0823 06/20/24 0234  NA 129* 129* 134* 133*  K 4.2 4.0 3.2* 4.0  CL 98 98 101 99  CO2 20* 20* 25 26  GLUCOSE 123* 109* 108* 107*  BUN 21 24* 21 18  CREATININE 1.18 1.25* 1.07 0.95  CALCIUM  8.9 8.8* 8.4* 8.7*  MG  --   --  1.6* 1.7  PROT 5.8*  --  5.2*  --   ALBUMIN 3.7  --  3.4*  --   AST 30  --  19  --   ALT 18  --  14  --   ALKPHOS 58  --  49  --   BILITOT 1.3*  --  1.0  --   GFRNONAA >60 59* >60 >60  ANIONGAP 11 11 8 8     Lipids No results for input(s): CHOL, TRIG, HDL, LABVLDL, LDLCALC, CHOLHDL in the last 168 hours.  Hematology Recent Labs  Lab 06/18/24 0053 06/19/24 0823 06/20/24 0234  WBC 5.1 5.8 5.5  RBC 3.39* 3.19* 3.22*  HGB 11.7* 11.2*  11.3*  HCT 34.0* 32.0* 31.8*  MCV 100.3* 100.3* 98.8  MCH 34.5* 35.1* 35.1*  MCHC 34.4 35.0 35.5  RDW 15.0 15.0 15.0  PLT 197 162 178   Thyroid  No results for input(s): TSH, FREET4 in the last 168 hours.  BNP Recent Labs  Lab 06/17/24 1421  BNP >4,500.0*    DDimer No results for input(s): DDIMER in the last 168 hours.   Cardiac Studies  06/18/2024 Echo complete 1. Left ventricular ejection fraction, by estimation, is 20 to 25%. Left  ventricular ejection fraction by 2D MOD biplane is 21.7 %. The left  ventricle has severely decreased function. The left ventricle demonstrates  global hypokinesis. There is mild  left ventricular hypertrophy. Left ventricular diastolic parameters are  indeterminate. The average left ventricular global longitudinal strain is  -6.8 %.   2. Right ventricular systolic function is moderately reduced. The right  ventricular size is mildly enlarged. There is moderately elevated  pulmonary artery systolic pressure. The estimated right ventricular  systolic pressure is 53.6 mmHg.   3. Left atrial size was  moderately dilated.   4. Right atrial size was moderately dilated.   5. Moderate pleural effusion in the left lateral region.   6. The mitral valve is normal in structure. Moderate to severe mitral  valve regurgitation. No evidence of mitral stenosis.   7. The aortic valve is calcified. There is moderate calcification of the  aortic valve. Aortic valve regurgitation is moderate. Aortic valve  sclerosis/calcification is present, without any evidence of aortic  stenosis. Aortic valve mean gradient measures  3.0 mmHg.   8. Pulmonic valve regurgitation is moderate.   9. The inferior vena cava is normal in size with greater than 50%  respiratory variability, suggesting right atrial pressure of 3 mmHg.    Patient Profile   78 y.o. 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   Assessment & Plan   New onset HFrEF - Initially presenting 6/20 with weight gail, shortness of breath, orthopnea, and edema - BNP > 4500 - Echo with EF 20-25%, global hypokinesis, mild LVH, moderately reduced RV function, mildly enlarged RV size, moderately elevated PASP, moderate to severe MR, moderate AR - Total output since admission -7.25 L - Abdominal US  concerning for bladder outlet obstruction, now with Foley in place - Volume status improving - Continue IV Lasix  40 mg twice daily  - Continue to monitor kidney function, strict I's and O's, daily weights with ongoing diuresis - Continue losartan  12.5 mg daily and metoprolol  25 mg daily - Start spironolactone 12.5 mg daily - Tentatively plan for [facility] tomorrow, NPO after midnight  Paroxysmal atrial fibrillation - Maintaining sinus rhythm - Recommend K > 4 and mag > 2 - Continue IV heparin   with plan to transition back to PTA Eliquis  prior to discharge - Continue metoprolol  succinate 25 mg daily  New LBBB - Noted on EKG 04/2024 - Plan for ischemic evaluation as above given newly reduced EF with global hypokinesis on echo  Hyponatremia - Chronic with Na improving today to 133 - Limit free water  intake and continue to monitor  Ascites - Patient presenting with significant weight gain - Abdominal US  showing severe bilateral dilatation collecting system - Large postvoid bladder residual - Foley catheter remains in  place - Urology following  Alcohol abuse - Cessation recommended  Informed Consent   Shared Decision Making/Informed Consent The risks [stroke (1 in 1000), death (1 in 1000), kidney failure [usually temporary] (1 in 500), bleeding (1 in 200), allergic reaction [possibly serious] (1 in 200)], benefits (diagnostic support and management of coronary artery disease) and alternatives of a cardiac catheterization were discussed in detail with Eugene White and he is willing to proceed.     For questions or updates, please contact [facility] Please consult [email or web address] for contact info under     Signed, Lesley LITTIE Maffucci, PA-C  06/20/2024, 10:28 AM      I have seen and examined the patient. I agree with the above note with the addition of : He reports improvement in shortness of breath.  No chest pain.  Echocardiogram showed an EF of 20 to 25% with moderate to severe mitral regurgitation.  By physical exam, heart is regular with no murmurs.  JVD is mildly elevated.  Lungs are clear to auscultation with mild bilateral leg edema.  Recommendations: Acute on chronic systolic heart failure with severely reduced LV systolic function with moderate to severe mitral regurgitation in the setting of left bundle branch block.  The patient is still volume overloaded.  Recommend continuing IV diuresis.  Continue losartan , metoprolol  and spironolactone and will consider  adding an SGLT2 inhibitor during this hospitalization. I recommend proceeding with a right and left cardiac catheterization possible PCI to be done tomorrow.  The patient is aware of risk and benefits.  He does have underlying left bundle branch block we will have to be cautious with advancing the Swan-Ganz catheter.  I consulted advanced heart failure as well.  Moderate to severe mitral regurgitation: This will have to be reevaluated after optimizing his medications and if this persists, should consider CRT given underlying left bundle branch block.  Deatrice Cage MD, FACC 06/20/2024 5:57 PM

## 2024-06-20 NOTE — Progress Notes (Signed)
 PHARMACY - ANTICOAGULATION CONSULT NOTE  Pharmacy Consult for Heparin  Infusion  Indication: atrial fibrillation  Allergies  Allergen Reactions   Sulfa Antibiotics Other (See Comments)    Unknown/childhood allergy unknown    Patient Measurements: Height: 5' 11 (180.3 cm) Weight: 85.9 kg (189 lb 6 oz) IBW/kg (Calculated) : 75.3 HEPARIN  DW (KG): 86.1  Vital Signs: Temp: 97.8 F (36.6 C) (06/23 1544) Temp Source: Oral (06/23 0439) BP: 118/87 (06/23 1544) Pulse Rate: 88 (06/23 1544)  Labs: Recent Labs    06/18/24 0053 06/19/24 0823 06/19/24 1759 06/20/24 0234 06/20/24 1425  HGB 11.7* 11.2*  --  11.3*  --   HCT 34.0* 32.0*  --  31.8*  --   PLT 197 162  --  178  --   APTT  --   --  35 145* 84*  HEPARINUNFRC  --   --  >1.10* >1.10*  --   CREATININE 1.25* 1.07  --  0.95  --     Estimated Creatinine Clearance: 68.3 mL/min (by C-G formula based on SCr of 0.95 mg/dL).   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   First degree AV block    GERD (gastroesophageal reflux disease)    GI symptom    had nausea / vomited once / frequent stools / getting better   Hyperlipidemia    Hypertension    Hypothyroidism    Mild carotid artery disease (HCC)    Mitral regurgitation    Mood changes    PAF (paroxysmal atrial fibrillation) (HCC)    Pneumonia    hx of   Tricuspid regurgitation    Tricuspid regurgitation     Medications:  Medications Prior to Admission  Medication Sig Dispense Refill Last Dose/Taking   acetaminophen  (TYLENOL ) 500 MG tablet Take 1,500 mg by mouth every 6 (six) hours as needed for  moderate pain (pain score 4-6).   Taking As Needed   apixaban  (ELIQUIS ) 5 MG TABS tablet Take 1 tablet (5 mg total) by mouth 2 (two) times daily. 180 tablet 1 06/17/2024 at 10:30 AM   atorvastatin  (LIPITOR) 20 MG tablet Take 1 tablet (20 mg total) by mouth daily. 90 tablet 1 06/17/2024   Cholecalciferol  (VITAMIN D ) 2000 UNITS CAPS Take 2,000 Units by mouth every evening.    06/17/2024   furosemide  (LASIX ) 40 MG tablet Take 1 tablet (40 mg total) by mouth daily. 30 tablet 3 06/17/2024   levothyroxine  (SYNTHROID ) 100 MCG tablet Take 1 tablet (100 mcg total) by mouth daily. 90 tablet 2 06/17/2024   losartan  (COZAAR ) 50 MG tablet Take 1 tablet (50 mg total) by mouth daily. 90 tablet 3 06/17/2024   magnesium  oxide (MAG-OX) 400 (240 Mg) MG tablet TAKE 1 TABLET TWICE DAILY . 180 tablet 3 06/16/2024   metoprolol  tartrate (LOPRESSOR ) 25 MG tablet Take 1 tablet (25 mg total) by mouth 2 (two) times daily. 180 tablet 2 06/17/2024   Multiple Vitamin (MULTIVITAMIN WITH MINERALS) TABS tablet Take 1 tablet by mouth daily.    06/17/2024   Multiple Vitamins-Minerals (PRESERVISION AREDS 2) CAPS Take 1 capsule by mouth 2 (  two) times daily.    06/17/2024   pantoprazole  (PROTONIX ) 40 MG tablet Take 1 tablet (40 mg total) by mouth daily. 90 tablet 3 06/17/2024   potassium chloride  SA (KLOR-CON  M20) 20 MEQ tablet Take 2 tablets (40 mEq total) by mouth daily. 180 tablet 3 06/17/2024   venlafaxine  XR (EFFEXOR -XR) 75 MG 24 hr capsule TAKE 1 CAPSULE EVERY DAY 90 capsule 3 06/17/2024   amoxicillin (AMOXIL) 500 MG capsule Take 4 capsules by mouth as directed. Before dental appointment       Assessment: 78 yo male with PMH of A. Fib, HTN, HFrEF, obesity, and GERD. Patient was on apixaban  for A. Fib and stroke prophylaxis, but it has been discontinued for right and left heart catheterization planned this week. Pharmacy has been consulted for heparin  infusion dosing and monitoring.  Last dose of apixaban  6/22 @ 0836  Goal of Therapy:   Heparin  level 0.3-0.7 units/ml aPTT 66-102 seconds Monitor platelets by anticoagulation protocol: Yes   6/23 @ 0234:  HL = > 1.10,  aPTT = 145  Plan:  6/23 @ 1425: aPTT 84 sec, therapeutic x 1 - continue heparin  infusion at 1000 units/hr - check confirmatory aPTT in 8 hrs - recheck HL on 6/24 with AM labs  Will need to adjust heparin  infusion based on APTT levels until APTT and HLs correlate  Check anti-Xa level and APTT in 8 hours and daily while on heparin  Continue to monitor H&H and platelets  Idolina DELENA Percy Clinical Pharmacist 06/20/2024 4:01 PM

## 2024-06-20 NOTE — Progress Notes (Signed)

## 2024-06-20 NOTE — H&P (View-Only) (Signed)
 Rounding Note   Patient Name: Eugene White Date of Encounter: 06/20/2024  [facility] Cardiologist: Stanly DELENA Leavens, MD   Subjective Patient reports improvements in dyspnea, remains on 1 L supplemental oxygen.  Good urine output yesterday.  Still appears somewhat volume up.  Tentatively plan for [facility] tomorrow.  Patient should be n.p.o. after midnight.  Scheduled Meds:  atorvastatin   20 mg Oral Daily   Chlorhexidine  Gluconate Cloth  6 each Topical Daily   furosemide   40 mg Intravenous BID   levothyroxine   100 mcg Oral Q0600   losartan   12.5 mg Oral Daily   metoprolol  succinate  25 mg Oral QPM   pantoprazole   40 mg Oral Daily   potassium chloride   40 mEq Oral BID   tamsulosin   0.4 mg Oral QPC supper   venlafaxine  XR  75 mg Oral Q breakfast   Continuous Infusions:  heparin  1,000 Units/hr (06/20/24 0622)   PRN Meds: acetaminophen  **OR** acetaminophen , ondansetron  **OR** ondansetron  (ZOFRAN ) IV, zolpidem    Vital Signs  Vitals:   06/20/24 0007 06/20/24 0355 06/20/24 0439 06/20/24 0832  BP: (!) 118/91  (!) 122/90 131/85  Pulse: 83  94 81  Resp: 18  18   Temp: 98 F (36.7 C)  98.2 F (36.8 C) (!) 97.4 F (36.3 C)  TempSrc:   Oral   SpO2: 99%   100%  Weight:  85.9 kg    Height:        Intake/Output Summary (Last 24 hours) at 06/20/2024 1028 Last data filed at 06/20/2024 0622 Gross per 24 hour  Intake 108.64 ml  Output 3300 ml  Net -3191.36 ml      06/20/2024    3:55 AM 06/19/2024    5:00 AM 06/18/2024    5:00 AM  Last 3 Weights  Weight (lbs) 189 lb 6 oz 190 lb 4.1 oz 189 lb 9.5 oz  Weight (kg) 85.9 kg 86.3 kg 86 kg      Telemetry Sinus rhythm - Personally Reviewed  Physical Exam  GEN: No acute distress.   Neck: No JVD Cardiac: RRR, no murmurs, rubs, or gallops.  Respiratory: Clear to auscultation bilaterally. GI: Soft, nontender, non-distended  MS: 1+ LE edema; abdominal distention; No deformity. Neuro:  Nonfocal  Psych: Normal  affect   Labs High Sensitivity Troponin:   Recent Labs  Lab 06/17/24 1421  TROPONINIHS 34*     Chemistry Recent Labs  Lab 06/17/24 1421 06/18/24 0053 06/19/24 0823 06/20/24 0234  NA 129* 129* 134* 133*  K 4.2 4.0 3.2* 4.0  CL 98 98 101 99  CO2 20* 20* 25 26  GLUCOSE 123* 109* 108* 107*  BUN 21 24* 21 18  CREATININE 1.18 1.25* 1.07 0.95  CALCIUM  8.9 8.8* 8.4* 8.7*  MG  --   --  1.6* 1.7  PROT 5.8*  --  5.2*  --   ALBUMIN 3.7  --  3.4*  --   AST 30  --  19  --   ALT 18  --  14  --   ALKPHOS 58  --  49  --   BILITOT 1.3*  --  1.0  --   GFRNONAA >60 59* >60 >60  ANIONGAP 11 11 8 8     Lipids No results for input(s): CHOL, TRIG, HDL, LABVLDL, LDLCALC, CHOLHDL in the last 168 hours.  Hematology Recent Labs  Lab 06/18/24 0053 06/19/24 0823 06/20/24 0234  WBC 5.1 5.8 5.5  RBC 3.39* 3.19* 3.22*  HGB 11.7* 11.2*  11.3*  HCT 34.0* 32.0* 31.8*  MCV 100.3* 100.3* 98.8  MCH 34.5* 35.1* 35.1*  MCHC 34.4 35.0 35.5  RDW 15.0 15.0 15.0  PLT 197 162 178   Thyroid  No results for input(s): TSH, FREET4 in the last 168 hours.  BNP Recent Labs  Lab 06/17/24 1421  BNP >4,500.0*    DDimer No results for input(s): DDIMER in the last 168 hours.   Cardiac Studies  06/18/2024 Echo complete 1. Left ventricular ejection fraction, by estimation, is 20 to 25%. Left  ventricular ejection fraction by 2D MOD biplane is 21.7 %. The left  ventricle has severely decreased function. The left ventricle demonstrates  global hypokinesis. There is mild  left ventricular hypertrophy. Left ventricular diastolic parameters are  indeterminate. The average left ventricular global longitudinal strain is  -6.8 %.   2. Right ventricular systolic function is moderately reduced. The right  ventricular size is mildly enlarged. There is moderately elevated  pulmonary artery systolic pressure. The estimated right ventricular  systolic pressure is 53.6 mmHg.   3. Left atrial size was  moderately dilated.   4. Right atrial size was moderately dilated.   5. Moderate pleural effusion in the left lateral region.   6. The mitral valve is normal in structure. Moderate to severe mitral  valve regurgitation. No evidence of mitral stenosis.   7. The aortic valve is calcified. There is moderate calcification of the  aortic valve. Aortic valve regurgitation is moderate. Aortic valve  sclerosis/calcification is present, without any evidence of aortic  stenosis. Aortic valve mean gradient measures  3.0 mmHg.   8. Pulmonic valve regurgitation is moderate.   9. The inferior vena cava is normal in size with greater than 50%  respiratory variability, suggesting right atrial pressure of 3 mmHg.    Patient Profile   78 y.o. 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   Assessment & Plan   New onset HFrEF - Initially presenting 6/20 with weight gail, shortness of breath, orthopnea, and edema - BNP > 4500 - Echo with EF 20-25%, global hypokinesis, mild LVH, moderately reduced RV function, mildly enlarged RV size, moderately elevated PASP, moderate to severe MR, moderate AR - Total output since admission -7.25 L - Abdominal US  concerning for bladder outlet obstruction, now with Foley in place - Volume status improving - Continue IV Lasix  40 mg twice daily  - Continue to monitor kidney function, strict I's and O's, daily weights with ongoing diuresis - Continue losartan  12.5 mg daily and metoprolol  25 mg daily - Start spironolactone 12.5 mg daily - Tentatively plan for [facility] tomorrow, NPO after midnight  Paroxysmal atrial fibrillation - Maintaining sinus rhythm - Recommend K > 4 and mag > 2 - Continue IV heparin   with plan to transition back to PTA Eliquis  prior to discharge - Continue metoprolol  succinate 25 mg daily  New LBBB - Noted on EKG 04/2024 - Plan for ischemic evaluation as above given newly reduced EF with global hypokinesis on echo  Hyponatremia - Chronic with Na improving today to 133 - Limit free water  intake and continue to monitor  Ascites - Patient presenting with significant weight gain - Abdominal US  showing severe bilateral dilatation collecting system - Large postvoid bladder residual - Foley catheter remains in  place - Urology following  Alcohol abuse - Cessation recommended  Informed Consent   Shared Decision Making/Informed Consent The risks [stroke (1 in 1000), death (1 in 1000), kidney failure [usually temporary] (1 in 500), bleeding (1 in 200), allergic reaction [possibly serious] (1 in 200)], benefits (diagnostic support and management of coronary artery disease) and alternatives of a cardiac catheterization were discussed in detail with Mr. Morella and he is willing to proceed.     For questions or updates, please contact [facility] Please consult [email or web address] for contact info under     Signed, Lesley LITTIE Maffucci, PA-C  06/20/2024, 10:28 AM      I have seen and examined the patient. I agree with the above note with the addition of : He reports improvement in shortness of breath.  No chest pain.  Echocardiogram showed an EF of 20 to 25% with moderate to severe mitral regurgitation.  By physical exam, heart is regular with no murmurs.  JVD is mildly elevated.  Lungs are clear to auscultation with mild bilateral leg edema.  Recommendations: Acute on chronic systolic heart failure with severely reduced LV systolic function with moderate to severe mitral regurgitation in the setting of left bundle branch block.  The patient is still volume overloaded.  Recommend continuing IV diuresis.  Continue losartan , metoprolol  and spironolactone and will consider  adding an SGLT2 inhibitor during this hospitalization. I recommend proceeding with a right and left cardiac catheterization possible PCI to be done tomorrow.  The patient is aware of risk and benefits.  He does have underlying left bundle branch block we will have to be cautious with advancing the Swan-Ganz catheter.  I consulted advanced heart failure as well.  Moderate to severe mitral regurgitation: This will have to be reevaluated after optimizing his medications and if this persists, should consider CRT given underlying left bundle branch block.  Deatrice Cage MD, FACC 06/20/2024 5:57 PM

## 2024-06-20 NOTE — Plan of Care (Signed)

## 2024-06-21 ENCOUNTER — Telehealth (HOSPITAL_COMMUNITY): Payer: Self-pay | Admitting: Pharmacy Technician

## 2024-06-21 ENCOUNTER — Encounter: Payer: Self-pay | Admitting: Internal Medicine

## 2024-06-21 ENCOUNTER — Encounter
Admission: EM | Disposition: A | Discharge status: Home Health | Payer: Self-pay | Source: Home / Self Care | Attending: Family Medicine

## 2024-06-21 ENCOUNTER — Other Ambulatory Visit (HOSPITAL_COMMUNITY): Payer: Self-pay

## 2024-06-21 DIAGNOSIS — R339 Retention of urine, unspecified: Secondary | ICD-10-CM

## 2024-06-21 DIAGNOSIS — F1010 Alcohol abuse, uncomplicated: Secondary | ICD-10-CM

## 2024-06-21 DIAGNOSIS — N400 Enlarged prostate without lower urinary tract symptoms: Secondary | ICD-10-CM

## 2024-06-21 DIAGNOSIS — E876 Hypokalemia: Secondary | ICD-10-CM

## 2024-06-21 DIAGNOSIS — I5021 Acute systolic (congestive) heart failure: Secondary | ICD-10-CM | POA: Diagnosis not present

## 2024-06-21 DIAGNOSIS — I340 Nonrheumatic mitral (valve) insufficiency: Secondary | ICD-10-CM

## 2024-06-21 DIAGNOSIS — I2510 Atherosclerotic heart disease of native coronary artery without angina pectoris: Secondary | ICD-10-CM | POA: Diagnosis not present

## 2024-06-21 HISTORY — PX: RIGHT HEART CATH AND CORONARY ANGIOGRAPHY: CATH118264

## 2024-06-21 LAB — POCT I-STAT EG7
Acid-Base Excess: 6 mmol/L — ABNORMAL HIGH (ref 0.0–2.0)
Bicarbonate: 30.6 mmol/L — ABNORMAL HIGH (ref 20.0–28.0)
Calcium, Ion: 1.15 mmol/L (ref 1.15–1.40)
HCT: 35 % — ABNORMAL LOW (ref 39.0–52.0)
Hemoglobin: 11.9 g/dL — ABNORMAL LOW (ref 13.0–17.0)
O2 Saturation: 62 %
Potassium: 3.7 mmol/L (ref 3.5–5.1)
Sodium: 135 mmol/L (ref 135–145)
TCO2: 32 mmol/L (ref 22–32)
pCO2, Ven: 45 mmHg (ref 44–60)
pH, Ven: 7.44 — ABNORMAL HIGH (ref 7.25–7.43)
pO2, Ven: 31 mmHg — CL (ref 32–45)

## 2024-06-21 LAB — POCT I-STAT 7, (LYTES, BLD GAS, ICA,H+H)
Acid-Base Excess: 4 mmol/L — ABNORMAL HIGH (ref 0.0–2.0)
Bicarbonate: 28.8 mmol/L — ABNORMAL HIGH (ref 20.0–28.0)
Calcium, Ion: 1.16 mmol/L (ref 1.15–1.40)
HCT: 34 % — ABNORMAL LOW (ref 39.0–52.0)
Hemoglobin: 11.6 g/dL — ABNORMAL LOW (ref 13.0–17.0)
O2 Saturation: 91 %
Potassium: 3.7 mmol/L (ref 3.5–5.1)
Sodium: 135 mmol/L (ref 135–145)
TCO2: 30 mmol/L (ref 22–32)
pCO2 arterial: 43.1 mmHg (ref 32–48)
pH, Arterial: 7.433 (ref 7.35–7.45)
pO2, Arterial: 61 mmHg — ABNORMAL LOW (ref 83–108)

## 2024-06-21 LAB — BASIC METABOLIC PANEL WITH GFR
Anion gap: 9 (ref 5–15)
BUN: 14 mg/dL (ref 8–23)
CO2: 28 mmol/L (ref 22–32)
Calcium: 8.5 mg/dL — ABNORMAL LOW (ref 8.9–10.3)
Chloride: 98 mmol/L (ref 98–111)
Creatinine, Ser: 0.97 mg/dL (ref 0.61–1.24)
GFR, Estimated: >60 mL/min (ref >60)
Glucose, Bld: 105 mg/dL — ABNORMAL HIGH (ref 70–99)
Potassium: 3.8 mmol/L (ref 3.5–5.1)
Sodium: 135 mmol/L (ref 135–145)

## 2024-06-21 LAB — HEPARIN LEVEL (UNFRACTIONATED): Heparin Unfractionated: >1.1 [IU]/mL — ABNORMAL HIGH (ref 0.30–0.70)

## 2024-06-21 LAB — CBC
HCT: 31.1 % — ABNORMAL LOW (ref 39.0–52.0)
Hemoglobin: 10.9 g/dL — ABNORMAL LOW (ref 13.0–17.0)
MCH: 35.4 pg — ABNORMAL HIGH (ref 26.0–34.0)
MCHC: 35 g/dL (ref 30.0–36.0)
MCV: 101 fL — ABNORMAL HIGH (ref 80.0–100.0)
Platelets: 171 10*3/uL (ref 150–400)
RBC: 3.08 MIL/uL — ABNORMAL LOW (ref 4.22–5.81)
RDW: 15.1 % (ref 11.5–15.5)
WBC: 4.6 10*3/uL (ref 4.0–10.5)
nRBC: 0 % (ref 0.0–0.2)

## 2024-06-21 LAB — APTT: aPTT: 110 s — ABNORMAL HIGH (ref 24–36)

## 2024-06-21 SURGERY — RIGHT HEART CATH AND CORONARY ANGIOGRAPHY
Anesthesia: Moderate Sedation

## 2024-06-21 MED ORDER — HYDRALAZINE HCL 20 MG/ML IJ SOLN: 10.0000 mg | INTRAMUSCULAR | Status: AC | PRN

## 2024-06-21 MED ORDER — VERAPAMIL HCL 2.5 MG/ML IV SOLN
INTRAVENOUS | Status: DC | PRN
  Administered 2024-06-21 (×2): 2.5 mg via INTRAVENOUS

## 2024-06-21 MED ORDER — MIDAZOLAM HCL 2 MG/2ML IJ SOLN
INTRAMUSCULAR | Status: DC | PRN
  Administered 2024-06-21: .5 mg via INTRAVENOUS

## 2024-06-21 MED ORDER — FENTANYL CITRATE (PF) 100 MCG/2ML IJ SOLN
INTRAMUSCULAR | Status: AC
  Filled 2024-06-21: qty 2

## 2024-06-21 MED ORDER — NITROGLYCERIN 1 MG/10 ML FOR IR/CATH LAB
INTRA_ARTERIAL | Status: AC
  Filled 2024-06-21: qty 10

## 2024-06-21 MED ORDER — ATORVASTATIN CALCIUM 20 MG PO TABS
40.0000 mg | ORAL_TABLET | Freq: Every day | ORAL | Status: DC
  Administered 2024-06-22 – 2024-06-24 (×3): 40 mg via ORAL
  Filled 2024-06-21 (×3): qty 2

## 2024-06-21 MED ORDER — HEPARIN (PORCINE) 25000 UT/250ML-% IV SOLN
900.0000 [IU]/h | INTRAVENOUS | Status: DC
  Administered 2024-06-21: 1000 [IU]/h via INTRAVENOUS
  Administered 2024-06-22: 900 [IU]/h via INTRAVENOUS
  Filled 2024-06-21: qty 250

## 2024-06-21 MED ORDER — SENNOSIDES-DOCUSATE SODIUM 8.6-50 MG PO TABS: 1.0000 | ORAL_TABLET | Freq: Every day | ORAL | Status: DC | PRN

## 2024-06-21 MED ORDER — HEPARIN SODIUM (PORCINE) 1000 UNIT/ML IJ SOLN
INTRAMUSCULAR | Status: AC
  Filled 2024-06-21: qty 10

## 2024-06-21 MED ORDER — LIDOCAINE HCL (PF) 1 % IJ SOLN
INTRAMUSCULAR | Status: DC | PRN
  Administered 2024-06-21: 5 mL
  Administered 2024-06-21: 2 mL

## 2024-06-21 MED ORDER — NITROGLYCERIN 1 MG/10 ML FOR IR/CATH LAB
INTRA_ARTERIAL | Status: DC | PRN
  Administered 2024-06-21 (×2): 200 ug via INTRA_ARTERIAL

## 2024-06-21 MED ORDER — SODIUM CHLORIDE 0.9 % IV SOLN
250.0000 mL | INTRAVENOUS | Status: AC | PRN
Start: 2024-06-21 — End: 2024-06-22

## 2024-06-21 MED ORDER — VERAPAMIL HCL 2.5 MG/ML IV SOLN
INTRAVENOUS | Status: AC
  Filled 2024-06-21: qty 2

## 2024-06-21 MED ORDER — POLYETHYLENE GLYCOL 3350 17 G PO PACK
17.0000 g | PACK | Freq: Two times a day (BID) | ORAL | Status: DC
  Administered 2024-06-21 – 2024-06-24 (×4): 17 g via ORAL
  Filled 2024-06-21 (×7): qty 1

## 2024-06-21 MED ORDER — SPIRONOLACTONE 25 MG PO TABS
25.0000 mg | ORAL_TABLET | Freq: Every day | ORAL | Status: DC
  Administered 2024-06-22 – 2024-06-24 (×3): 25 mg via ORAL
  Filled 2024-06-21 (×3): qty 1

## 2024-06-21 MED ORDER — SODIUM CHLORIDE 0.9% FLUSH
3.0000 mL | Freq: Two times a day (BID) | INTRAVENOUS | Status: DC
Start: 2024-06-21 — End: 2024-06-24
  Administered 2024-06-21 – 2024-06-24 (×7): 3 mL via INTRAVENOUS

## 2024-06-21 MED ORDER — HEPARIN SODIUM (PORCINE) 1000 UNIT/ML IJ SOLN
INTRAMUSCULAR | Status: DC | PRN
Start: 2024-06-21 — End: 2024-06-21
  Administered 2024-06-21: 4000 [IU] via INTRAVENOUS

## 2024-06-21 MED ORDER — SODIUM CHLORIDE 0.9% FLUSH
3.0000 mL | INTRAVENOUS | Status: DC | PRN
  Administered 2024-06-24: 3 mL via INTRAVENOUS

## 2024-06-21 MED ORDER — IOHEXOL 300 MG/ML  SOLN
INTRAMUSCULAR | Status: DC | PRN
  Administered 2024-06-21: 62 mL

## 2024-06-21 MED ORDER — HEPARIN (PORCINE) IN NACL 2000-0.9 UNIT/L-% IV SOLN
INTRAVENOUS | Status: DC | PRN
  Administered 2024-06-21: 1000 mL

## 2024-06-21 MED ORDER — MIDAZOLAM HCL 2 MG/2ML IJ SOLN
INTRAMUSCULAR | Status: AC
  Filled 2024-06-21: qty 2

## 2024-06-21 MED ORDER — FENTANYL CITRATE (PF) 100 MCG/2ML IJ SOLN
INTRAMUSCULAR | Status: DC | PRN
  Administered 2024-06-21 (×2): 12.5 ug via INTRAVENOUS

## 2024-06-21 MED ORDER — LIDOCAINE HCL 1 % IJ SOLN
INTRAMUSCULAR | Status: AC
Start: 2024-06-21 — End: 2024-06-21
  Filled 2024-06-21: qty 20

## 2024-06-21 MED ORDER — HEPARIN (PORCINE) IN NACL 1000-0.9 UT/500ML-% IV SOLN
INTRAVENOUS | Status: AC
  Filled 2024-06-21: qty 1000

## 2024-06-21 SURGICAL SUPPLY — 13 items
CATH BALLN WEDGE 5F 110CM (CATHETERS) IMPLANT
CATH INFINITI 5FR JL4 (CATHETERS) IMPLANT
CATH INFINITI AMBI 5FR TG (CATHETERS) IMPLANT
DEVICE RAD TR BAND REGULAR (VASCULAR PRODUCTS) IMPLANT
DRAPE BRACHIAL (DRAPES) IMPLANT
GLIDESHEATH SLEND SS 6F .021 (SHEATH) IMPLANT
GUIDEWIRE INQWIRE 1.5J.035X260 (WIRE) IMPLANT
PACK CARDIAC CATH (CUSTOM PROCEDURE TRAY) ×1 IMPLANT
PAD ELECT DEFIB RADIOL ZOLL (MISCELLANEOUS) IMPLANT
SET ATX-X65L (MISCELLANEOUS) IMPLANT
SHEATH GLIDE SLENDER 4/5FR (SHEATH) IMPLANT
STATION PROTECTION PRESSURIZED (MISCELLANEOUS) IMPLANT
WIRE HITORQ VERSACORE ST 145CM (WIRE) IMPLANT

## 2024-06-21 NOTE — Progress Notes (Signed)
 PT Cancellation Note  Patient Details Name: Eugene White MRN: [id number] DOB: [birth year]   Cancelled Treatment:    Reason Eval/Treat Not Completed: Other (comment).  PT consult received; chart reviewed. Pt is scheduled for [facility] today and per secure chat with MD hold eval until after procedure. Will re-attempt PT evaluation at a later date/time.  Damien Caulk, PT 06/21/24, 10:41 AM

## 2024-06-21 NOTE — Telephone Encounter (Signed)
 Patient Product/process development scientist completed.    The patient is insured through [employer]. Patient has Medicare and is not eligible for a copay card, but may be able to apply for patient assistance or Medicare RX Payment Plan (Patient Must reach out to their plan, if eligible for payment plan), if available.    Ran test claim for Entresto 24-26 mg and the current 30 day co-pay is $45.00.  Ran test claim for Farxiga 10 mg and the current 30 day co-pay is $45.00.  Ran test claim for Jardiance 10 mg and the current 30 day co-pay is $45.00.  This test claim was processed through [facility]- copay amounts may vary at other pharmacies due to pharmacy/plan contracts, or as the patient moves through the different stages of their insurance plan.     Reyes Sharps, CPHT Pharmacy Technician III Certified Patient Advocate [facility] Pharm[facility] Patient Advocate Team Direct Number: [phone]  Fax: [phone]

## 2024-06-21 NOTE — Plan of Care (Signed)

## 2024-06-21 NOTE — Evaluation (Signed)
 Occupational Therapy Evaluation Patient Details Name: Eugene White MRN: [id number] DOB: 10/18/1946 Today's Date: 06/21/2024   History of Present Illness   Pt is a 78 y.o. male who presented with SOB, swelling, fatigue. Admitted for management of acute CHF, paroxysmal A-fib, and ascites. PMH of HTN, obesity, pAF on Eliquis , hypothyroidism, daily alcohol use, chronic arthritis pain.     Clinical Impressions Pt was seen for OT evaluation this date. PTA, pt resides at home with his wife with 2 STE. Reports IND with ADLs at baseline and use of SPC for ambulation. Pt was driving and community mobile.   Pt presents to acute OT demonstrating impaired ADL performance and functional mobility 2/2 weakness, balance deficits and low activity tolerance. Wife present at bedside. Pt denies pain and is found on 2L 02 with sp02 at 99%. HR 100. Pt underwent heart cath this AM and has R sided broken wing precautions currently. Pt needs increased encouragement to participate d/t endorsing fatigue. Agreeable to light eval, performed bed mobility with CGA/SBA with education to not utilize RUE. Able to sit EOB with supervision, but required Mod A for STS from EOB with HHA x1 on L side then took a few small side steps to [facility] prior to returning to supine. Declined ADLs, but anticipate Min/Mod A at this time. Pt adamant he will return home with his wife, will recommend HHOT and further assess at next session and update recommendation as needed based on progress once cleared from R broken wing precautions after heart cath.  Pt would benefit from skilled OT services to address noted impairments and functional limitations to maximize safety and independence while minimizing falls risk and caregiver burden. Do anticipate the need for follow up OT services upon acute hospital DC.      If plan is discharge home, recommend the following:   A little help with walking and/or transfers;A lot of help with  bathing/dressing/bathroom;Assist for transportation;Assistance with cooking/housework;Help with stairs or ramp for entrance     Functional Status Assessment   Patient has had a recent decline in their functional status and demonstrates the ability to make significant improvements in function in a reasonable and predictable amount of time.     Equipment Recommendations   Other (comment) (RW)     Recommendations for Other Services         Precautions/Restrictions   Precautions Precautions: Fall Recall of Precautions/Restrictions: Impaired Precaution/Restrictions Comments: R radial heart cath-broken wing precautions     Mobility Bed Mobility Overal bed mobility: Needs Assistance Bed Mobility: Supine to Sit, Sit to Supine     Supine to sit: Contact guard, Supervision, HOB elevated Sit to supine: Contact guard assist, Supervision   General bed mobility comments: edu to not utilize RUE for pushing/pulling with all tasks at this time following heart cath, increased time and effort to get OOB d/t precautions    Transfers Overall transfer level: Needs assistance Equipment used: None, 1 person hand held assist Transfers: Sit to/from Stand Sit to Stand: Mod assist           General transfer comment: Mod A for STS from EOB with HHA x1 using LUE only d/t RUE broken wing precautions after heart cath-reports he was up to recliner yesterday without difficulty      Balance Overall balance assessment: Needs assistance Sitting-balance support: Feet unsupported, Single extremity supported Sitting balance-Leahy Scale: Good Sitting balance - Comments: steady static seated balance at EOB   Standing balance support: Single extremity supported Standing balance-Leahy  Scale: Fair Standing balance comment: HHA x1 and min/CGA to lateral step to [facility]                           ADL either performed or assessed with clinical judgement   ADL Overall ADL's : Needs  assistance/impaired                     Lower Body Dressing: Moderate assistance Lower Body Dressing Details (indicate cue type and reason): anticipate Toilet Transfer: Minimal assistance;Moderate assistance Toilet Transfer Details (indicate cue type and reason): anticipate Min/Mod A d/t needing assist for STS from [facility] with broken wing precautions after heart cath                 Vision         Perception         Praxis         Pertinent Vitals/Pain Pain Assessment Pain Assessment: No/denies pain     Extremity/Trunk Assessment Upper Extremity Assessment Upper Extremity Assessment: Generalized weakness   Lower Extremity Assessment Lower Extremity Assessment: Generalized weakness       Communication Communication Communication: No apparent difficulties   Cognition Arousal: Alert Behavior During Therapy: WFL for tasks assessed/performed                                 Following commands: Intact       Cueing  General Comments   Cueing Techniques: Verbal cues  HR 100 at rest, sp02 99% on 2L   Exercises Other Exercises Other Exercises: Edu on role of OT in acute setting   Shoulder Instructions      Home Living Family/patient expects to be discharged to:: Private residence Living Arrangements: Spouse/significant other Available Help at Discharge: Family;Available 24 hours/day Type of Home: House Home Access: Stairs to enter [employer] of Steps: 2   Home Layout: One level     Bathroom Shower/Tub: Tub/shower unit;Walk-in shower   Bathroom Toilet: Handicapped height (BSC over toilet)     Home Equipment: Cane - single point;Shower seat;BSC/3in1          Prior Functioning/Environment Prior Level of Function : Independent/Modified Independent;Driving             Mobility Comments: ind with SPC use ADLs Comments: IND, drives, grocery shops    OT Problem List: Decreased strength;Decreased  activity tolerance;Impaired balance (sitting and/or standing)   OT Treatment/Interventions: Self-care/ADL training;Therapeutic exercise;Therapeutic activities;Energy conservation;DME and/or AE instruction;Patient/family education;Balance training      OT Goals(Current goals can be found in the care plan section)   Acute Rehab OT Goals Patient Stated Goal: return home OT Goal Formulation: With patient/family Time For Goal Achievement: 07/05/24 Potential to Achieve Goals: Fair ADL Goals Pt Will Perform Lower Body Bathing: with supervision;sitting/lateral leans;sit to/from stand Pt Will Perform Lower Body Dressing: with supervision;sit to/from stand;sitting/lateral leans Pt Will Transfer to Toilet: with supervision;ambulating;regular height toilet;bedside commode;grab bars Pt Will Perform Toileting - Clothing Manipulation and hygiene: with supervision;sitting/lateral leans;sit to/from stand   OT Frequency:  Min 2X/week    Co-evaluation              AM-PAC OT 6 Clicks Daily Activity     Outcome Measure Help from another person eating meals?: None Help from another person taking care of personal grooming?: A Little Help from another person toileting, which includes using toliet,  bedpan, or urinal?: A Lot Help from another person bathing (including washing, rinsing, drying)?: A Lot Help from another person to put on and taking off regular upper body clothing?: A Little Help from another person to put on and taking off regular lower body clothing?: A Lot 6 Click Score: 16   End of Session Equipment Utilized During Treatment: Oxygen Nurse Communication: Mobility status  Activity Tolerance: Patient tolerated treatment well;Treatment limited secondary to medical complications (Comment);Other (comment);Patient limited by fatigue (fatigue and R broken wing precautions after heart cath) Patient left: in bed;with call bell/phone within reach;with bed alarm set;with family/visitor  present  OT Visit Diagnosis: Other abnormalities of gait and mobility (R26.89);Muscle weakness (generalized) (M62.81)                Time: 8479-8463 OT Time Calculation (min): 16 min Charges:  OT General Charges $OT Visit: 1 Visit OT Evaluation $OT Eval Low Complexity: 1 Low Amberley Hamler, OTR/L 06/21/24, 3:58 PM  Cloyde Oregel E Nikira Kushnir 06/21/2024, 3:52 PM

## 2024-06-21 NOTE — Progress Notes (Signed)
 PROGRESS NOTE  Eugene White  FMW:[id number] DOB: 1946-05-07 DOA: 06/17/2024 PCP: Myrla Jon HERO, MD  Consultants  Brief Narrative: 78 y.o. male with a PMH significant for HTN, obesity, pAF on Eliquis , hypothyroidism who presented with SOB, swelling, fatigue.  Patient lost about 50 lbs over the last year, but in the last month, has gained 20 lbs, and during the same time, has noticed swelling in his abdomen and legs.  Finally, he went to PCP's office, we here he was noted to have BNP >4000, and started on Lasix .  He trialed this at home and had marginal improvement, but was still too SOB to walk across the room so he came to the ER.  Started on IV Lasix  and TRH called to admit.  Assessment & Plan: Acute CHF (congestive heart failure) (HCC) - Breathing continues to improve.  Much less short of breath when moving around.  Has also noticed decreased swelling in legs and flank. -Still with notable volume overload on exam.  - Echo this admission showed EF of 25%.  Noted decrease from EF several years ago.  Continue IV Lasix .  Down 7.25 L thus far. - Cardiology following.  Plan is for right/left heart cath this morning. - Plan will be eventual switch to ARNI from losartan .  Continue IV Lasix  and Toprol -XL.  Spironolactone increased to 25 mg daily.  Cardiomyopathy: - Agree with cessation of alcohol and right/left heart cath   Paroxysmal atrial fibrillation (HCC) In sinus rhythm here - Cardiology has switched to heparin  in prep for cardiac catheterization. - Added metoprolol  succinate rather than tartrate. - Switch back to Eliquis  prior to DC  BPH: - Foley catheter, placed by urology - Much better urine output since placement. - Urology started Flomax , will continue.  Daily alcohol use: - No signs of withdrawal, will closely monitor for this. - Recommend cessation in light of cardiomyopathy above  Elevated Cr: - not quite to level of AKI--> now downtrending since placement of  Foley.  Likely obstructive - Back to his baseline creatinine   Ascites - likely cardiogenic.  But he reports daily alcohol use. - numerous liver cysts noted without frank fibrosis/cirrhosis noted on liver ultrasound   Hyponatremia - Much improved today with diuresis.  Secondary to volume overload - Continue diuresis, limit free water  intake  Hypokalemia/hypomagnesemia: - Will continue to trend in light of ongoing diuresis.    Macrocytic anemia: - reports daily EtOH usage -Hemoglobin has been stable in house.  Anemia goes back to at least 2023 based on review of records. - trend Hgb.  No bleeding   Obesity (BMI 30-39.9) History of class 1 obesity, lost 50 pounds last year, gained 20 since start of June.     Essential (primary) hypertension Blood pressure normal - Continue metoprolol  - Lasix    Hypothyroidism TSH minimally elevated 1 week ago - Continue home levothyroxine   DVT prophylaxis:  heparin   Code Status:   Code Status: Full Code Family Communication: spoke with daughter by phone Level of care: Telemetry Cardiac Status is: Inpatient Dispo:  pending further diuresis and right/left heart cath  Consults called: Urology, cardiology   Subjective: Patient awake and alert, breathing better.  Still O2 dependent.  Has noted vast improvement leg swelling.  No complaints or concerns today.  Objective: Vitals:   06/21/24 0500 06/21/24 0738 06/21/24 0958 06/21/24 1008  BP:  138/75 (!) 118/94   Pulse:  85 99 99  Resp:  18 20 14   Temp:  98.1 F (36.7 C)  97.8 F (36.6 C)   TempSrc:   Oral   SpO2:  100% 97% 100%  Weight: 81.1 kg  81.1 kg   Height:   5' 11 (1.803 m)     Intake/Output Summary (Last 24 hours) at 06/21/2024 1030 Last data filed at 06/21/2024 0630 Gross per 24 hour  Intake 469 ml  Output 2650 ml  Net -2181 ml   Filed Weights   06/20/24 0355 06/21/24 0500 06/21/24 0958  Weight: 85.9 kg 81.1 kg 81.1 kg   Body mass index is 24.94 kg/m.  Gen: 78  y.o. male in no apparent distress.  Nontoxic lying in bed without complaints. HEENT:   Right eyelid everted and with some Right sided facial droop, both being chronic  Pulm: Non-labored breathing.  No crackles anterior chest CV: Regular rate and rhythm.  GI: Abdomen soft, non-tender, non-distended.  With persistent abd wall edema BL flanks Ext: Warm, no deformities, +2 BL LE edema to mid thighs, better than admit.  Skin: No rashes, lesions Neuro: Alert and oriented x 4. Muscle tone normal, moving all 4 limbs symmetrically Psych: Calm  Judgement and insight appear normal. Mood & affect appropriate.     I have personally reviewed the following labs and images: CBC: Recent Labs  Lab 06/17/24 1421 06/18/24 0053 06/19/24 0823 06/20/24 0234 06/21/24 0615  WBC 4.8 5.1 5.8 5.5 4.6  HGB 12.2* 11.7* 11.2* 11.3* 10.9*  HCT 34.5* 34.0* 32.0* 31.8* 31.1*  MCV 98.9 100.3* 100.3* 98.8 101.0*  PLT 212 197 162 178 171   BMP &GFR Recent Labs  Lab 06/17/24 1421 06/18/24 0053 06/19/24 0823 06/20/24 0234 06/21/24 0615  NA 129* 129* 134* 133* 135  K 4.2 4.0 3.2* 4.0 3.8  CL 98 98 101 99 98  CO2 20* 20* 25 26 28   GLUCOSE 123* 109* 108* 107* 105*  BUN 21 24* 21 18 14   CREATININE 1.18 1.25* 1.07 0.95 0.97  CALCIUM  8.9 8.8* 8.4* 8.7* 8.5*  MG  --   --  1.6* 1.7  --    Estimated Creatinine Clearance: 66.8 mL/min (by C-G formula based on SCr of 0.97 mg/dL). Liver & Pancreas: Recent Labs  Lab 06/17/24 1421 06/19/24 0823  AST 30 19  ALT 18 14  ALKPHOS 58 49  BILITOT 1.3* 1.0  PROT 5.8* 5.2*  ALBUMIN 3.7 3.4*   No results for input(s): LIPASE, AMYLASE in the last 168 hours. No results for input(s): AMMONIA in the last 168 hours. Diabetic: No results for input(s): HGBA1C in the last 72 hours. No results for input(s): GLUCAP in the last 168 hours. Cardiac Enzymes: No results for input(s): CKTOTAL, CKMB, CKMBINDEX, TROPONINI in the last 168 hours. No results for  input(s): PROBNP in the last 8760 hours. Coagulation Profile: Recent Labs  Lab 06/17/24 1421  INR 1.6*   Thyroid  Function Tests: No results for input(s): TSH, T4TOTAL, FREET4, T3FREE, THYROIDAB in the last 72 hours. Lipid Profile: No results for input(s): CHOL, HDL, LDLCALC, TRIG, CHOLHDL, LDLDIRECT in the last 72 hours. Anemia Panel: No results for input(s): VITAMINB12, FOLATE, FERRITIN, TIBC, IRON, RETICCTPCT in the last 72 hours. Urine analysis:    Component Value Date/Time   COLORURINE YELLOW (A) 06/18/2022 1655   APPEARANCEUR Hazy (A) 09/17/2022 1555   LABSPEC 1.018 06/18/2022 1655   PHURINE 6.0 06/18/2022 1655   GLUCOSEU Negative 09/17/2022 1555   HGBUR NEGATIVE 06/18/2022 1655   BILIRUBINUR Negative 09/17/2022 1555   KETONESUR 5 (A) 06/18/2022 1655   PROTEINUR 2+ (A)  09/17/2022 1555   PROTEINUR 100 (A) 06/18/2022 1655   UROBILINOGEN 0.2 06/26/2022 1608   UROBILINOGEN 0.2 01/03/2015 1433   NITRITE Negative 09/17/2022 1555   NITRITE NEGATIVE 06/18/2022 1655   LEUKOCYTESUR Trace (A) 09/17/2022 1555   LEUKOCYTESUR NEGATIVE 06/18/2022 1655   Sepsis Labs: Invalid input(s): PROCALCITONIN, LACTICIDVEN  Microbiology: No results found for this or any previous visit (from the past 240 hours).  Radiology Studies: No results found.   Scheduled Meds:  [MAR Hold] atorvastatin   20 mg Oral Daily   [MAR Hold] Chlorhexidine  Gluconate Cloth  6 each Topical Daily   [MAR Hold] furosemide   40 mg Intravenous BID   [MAR Hold] levothyroxine   100 mcg Oral Q0600   [MAR Hold] losartan   12.5 mg Oral Daily   [MAR Hold] metoprolol  succinate  25 mg Oral QPM   [MAR Hold] pantoprazole   40 mg Oral Daily   [MAR Hold] potassium chloride   40 mEq Oral BID   [MAR Hold] sodium chloride  flush  10-40 mL Intracatheter Q12H   [MAR Hold] spironolactone  12.5 mg Oral Daily   [MAR Hold] tamsulosin   0.4 mg Oral QPC supper   [MAR Hold] venlafaxine  XR  75 mg Oral Q  breakfast   Continuous Infusions:  sodium chloride  10 mL/hr at 06/21/24 0630   heparin  Stopped (06/21/24 1019)     LOS: 4 days   35 minutes with more than 50% spent in reviewing records, counseling patient/family and coordinating care.  Reyes VEAR Gaw, MD [facility] [email or web address] 06/21/2024, 10:30 AM

## 2024-06-21 NOTE — Interval H&P Note (Signed)
 History and Physical Interval Note:  06/21/2024 11:13 AM  Eugene White  has presented today for surgery, with the diagnosis of acute heart failure with reduced ejection fraction.  The various methods of treatment have been discussed with the patient and family. After consideration of risks, benefits and other options for treatment, the patient has consented to  Procedure(s): RIGHT/LEFT HEART CATH AND CORONARY ANGIOGRAPHY (N/A) as a surgical intervention.  The patient's history has been reviewed, patient examined, no change in status, stable for surgery.  I have reviewed the patient's chart and labs.  Questions were answered to the patient's satisfaction.    Cath Lab Visit (complete for each Cath Lab visit)  Clinical Evaluation Leading to the Procedure:   ACS: No.  Non-ACS:    Anginal/Heart Failure Classification: NYHA class IV  Anti-ischemic medical therapy: Minimal Therapy (1 class of medications)  Non-Invasive Test Results: LVEF 20-25% by echo -> high risk  Prior CABG: No previous CABG  Eugene White

## 2024-06-21 NOTE — Progress Notes (Signed)
 OT Cancellation Note  Patient Details Name: Eugene White MRN: [id number] DOB: Aug 14, 1946   Cancelled Treatment:    Reason Eval/Treat Not Completed: Other (comment). Order received, chart reviewed. Pt is scheduled for [facility] today and per secure chat with MD hold eval until after procedure. Will follow up this afternoon if pt is medically appropriate.  Candida Vetter E Aleeya Veitch 06/21/2024, 8:42 AM

## 2024-06-21 NOTE — Progress Notes (Signed)
 Heart Failure Navigator Progress Note  Assessed for Heart & Vascular TOC clinic readiness.  Patient does not meet criteria due to current Advanced Heart Failure Team patient.   Navigator will sign off at this time.  Roxy Horseman, RN, BSN [facility] Heart Failure Navigator Secure Chat Only

## 2024-06-21 NOTE — Progress Notes (Signed)
 PHARMACY - ANTICOAGULATION CONSULT NOTE  Pharmacy Consult for Heparin  Infusion  Indication: atrial fibrillation  Allergies  Allergen Reactions   Sulfa Antibiotics Other (See Comments)    Unknown/childhood allergy unknown   Patient Measurements: Height: 5' 11 (180.3 cm) Weight: 81.1 kg (178 lb 12.7 oz) IBW/kg (Calculated) : 75.3 HEPARIN  DW (KG): 81.1  Vital Signs: Temp: 97.9 F (36.6 C) (06/24 1330) Temp Source: Oral (06/24 0958) BP: 145/103 (06/24 1345) Pulse Rate: 96 (06/24 1330)  Labs: Recent Labs    06/19/24 0823 06/19/24 0823 06/19/24 1759 06/20/24 0234 06/20/24 1425 06/20/24 2238 06/21/24 0615 06/21/24 0630 06/21/24 1130 06/21/24 1134  HGB 11.2*  --   --  11.3*  --   --  10.9*  --  11.9* 11.6*  HCT 32.0*  --   --  31.8*  --   --  31.1*  --  35.0* 34.0*  PLT 162  --   --  178  --   --  171  --   --   --   APTT  --    < > 35 145* 84* 58*  --  110*  --   --   HEPARINUNFRC  --   --  >1.10* >1.10*  --   --   --  >1.10*  --   --   CREATININE 1.07  --   --  0.95  --   --  0.97  --   --   --    < > = values in this interval not displayed.   Estimated Creatinine Clearance: 66.8 mL/min (by C-G formula based on SCr of 0.97 mg/dL).  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   First degree AV block    GERD (gastroesophageal reflux disease)    GI symptom    had nausea / vomited once / frequent stools / getting better   Hyperlipidemia    Hypertension    Hypothyroidism    Mild carotid artery disease (HCC)    Mitral regurgitation    Mood changes    PAF (paroxysmal atrial fibrillation) (HCC)     Pneumonia    hx of   Tricuspid regurgitation    Tricuspid regurgitation     Medications:  Medications Prior to Admission  Medication Sig Dispense Refill Last Dose/Taking   acetaminophen  (TYLENOL ) 500 MG tablet Take 1,500 mg by mouth every 6 (six) hours as needed for moderate pain (pain score 4-6).   Taking As Needed   apixaban  (ELIQUIS ) 5 MG TABS tablet Take 1 tablet (5 mg total) by mouth 2 (two) times daily. 180 tablet 1 06/17/2024 at 10:30 AM   atorvastatin  (LIPITOR) 20 MG tablet Take 1 tablet (20 mg total) by mouth daily. 90 tablet 1 06/17/2024   Cholecalciferol  (VITAMIN D ) 2000 UNITS CAPS Take 2,000 Units by mouth every evening.    06/17/2024   furosemide  (LASIX ) 40 MG tablet Take 1 tablet (40 mg total) by mouth daily. 30 tablet 3 06/17/2024   levothyroxine  (SYNTHROID ) 100 MCG tablet Take 1 tablet (100 mcg total) by mouth daily. 90 tablet 2 06/17/2024  losartan  (COZAAR ) 50 MG tablet Take 1 tablet (50 mg total) by mouth daily. 90 tablet 3 06/17/2024   magnesium  oxide (MAG-OX) 400 (240 Mg) MG tablet TAKE 1 TABLET TWICE DAILY . 180 tablet 3 06/16/2024   metoprolol  tartrate (LOPRESSOR ) 25 MG tablet Take 1 tablet (25 mg total) by mouth 2 (two) times daily. 180 tablet 2 06/17/2024   Multiple Vitamin (MULTIVITAMIN WITH MINERALS) TABS tablet Take 1 tablet by mouth daily.    06/17/2024   Multiple Vitamins-Minerals (PRESERVISION AREDS 2) CAPS Take 1 capsule by mouth 2 (two) times daily.    06/17/2024   pantoprazole  (PROTONIX ) 40 MG tablet Take 1 tablet (40 mg total) by mouth daily. 90 tablet 3 06/17/2024   potassium chloride  SA (KLOR-CON  M20) 20 MEQ tablet Take 2 tablets (40 mEq total) by mouth daily. 180 tablet 3 06/17/2024   venlafaxine  XR (EFFEXOR -XR) 75 MG 24 hr capsule TAKE 1 CAPSULE EVERY DAY 90 capsule 3 06/17/2024   amoxicillin (AMOXIL) 500 MG capsule Take 4 capsules by mouth as directed. Before dental appointment      Assessment: 78 yo male with PMH of A. Fib, HTN, HFrEF, obesity, and GERD. Patient  was on apixaban  for A. Fib and stroke prophylaxis, but it has been discontinued for right and left heart catheterization planned this week. Pharmacy has been consulted for heparin  infusion dosing and monitoring.  Last dose of apixaban  6/22 @ 0836.  **06/23 ~2200 RN reports hematuria, target low end of therapeutic range per Dr. Cleatus**  Goal of Therapy:  Heparin  level 0.3-0.5 units/ml aPTT 66-85 seconds Monitor platelets by anticoagulation protocol: Yes  Date Time Results Comments 6/23 0234 aPTT=145 HL = > 1.10 (not correlating), rate 1300 units/hr 6/23 1425 aPTT=84 Therapeutic, rate 1000 units/hour 6/23 2200   Change therapeutic target 2ry to hematuria 6/23 2238 aPTT=58 Subtherapeutic, rate 1000 units/hr 6/24 0630 aPTT=110 HL >1.10, SUPRA-therapeutic 1100 units/hours 6/24 Resume 2 hrs after band removal    Plan:  Resume  heparin  infusion at 1000 units/hour (today 6/24@1530 ) Repeat aPTT 8hrs after infusion start Check HL with AM labs Continue to monitor H&H and platelets with AM labs  Jerrica Thorman Rodriguez-Guzman PharmD, BCPS 06/21/2024 2:02 PM

## 2024-06-21 NOTE — Progress Notes (Signed)
 PHARMACY - ANTICOAGULATION CONSULT NOTE  Pharmacy Consult for Heparin  Infusion  Indication: atrial fibrillation  Allergies  Allergen Reactions   Sulfa Antibiotics Other (See Comments)    Unknown/childhood allergy unknown   Patient Measurements: Height: 5' 11 (180.3 cm) Weight: 81.1 kg (178 lb 12.7 oz) IBW/kg (Calculated) : 75.3 HEPARIN  DW (KG): 86.1  Vital Signs: Temp: 98.1 F (36.7 C) (06/24 0738) BP: 138/75 (06/24 0738) Pulse Rate: 85 (06/24 0738)  Labs: Recent Labs    06/19/24 0823 06/19/24 0823 06/19/24 1759 06/20/24 0234 06/20/24 1425 06/20/24 2238 06/21/24 0615 06/21/24 0630  HGB 11.2*  --   --  11.3*  --   --  10.9*  --   HCT 32.0*  --   --  31.8*  --   --  31.1*  --   PLT 162  --   --  178  --   --  171  --   APTT  --    < > 35 145* 84* 58*  --  110*  HEPARINUNFRC  --   --  >1.10* >1.10*  --   --   --  >1.10*  CREATININE 1.07  --   --  0.95  --   --  0.97  --    < > = values in this interval not displayed.   Estimated Creatinine Clearance: 66.8 mL/min (by C-G formula based on SCr of 0.97 mg/dL).  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   First degree AV block    GERD (gastroesophageal reflux disease)    GI symptom    had nausea / vomited once / frequent stools / getting better   Hyperlipidemia    Hypertension    Hypothyroidism    Mild carotid artery disease (HCC)    Mitral regurgitation    Mood changes    PAF (paroxysmal atrial fibrillation) (HCC)    Pneumonia    hx of   Tricuspid regurgitation    Tricuspid regurgitation     Medications:  Medications Prior to Admission   Medication Sig Dispense Refill Last Dose/Taking   acetaminophen  (TYLENOL ) 500 MG tablet Take 1,500 mg by mouth every 6 (six) hours as needed for moderate pain (pain score 4-6).   Taking As Needed   apixaban  (ELIQUIS ) 5 MG TABS tablet Take 1 tablet (5 mg total) by mouth 2 (two) times daily. 180 tablet 1 06/17/2024 at 10:30 AM   atorvastatin  (LIPITOR) 20 MG tablet Take 1 tablet (20 mg total) by mouth daily. 90 tablet 1 06/17/2024   Cholecalciferol  (VITAMIN D ) 2000 UNITS CAPS Take 2,000 Units by mouth every evening.    06/17/2024   furosemide  (LASIX ) 40 MG tablet Take 1 tablet (40 mg total) by mouth daily. 30 tablet 3 06/17/2024   levothyroxine  (SYNTHROID ) 100 MCG tablet Take 1 tablet (100 mcg total) by mouth daily. 90 tablet 2 06/17/2024   losartan  (COZAAR ) 50 MG tablet Take 1 tablet (50 mg total) by mouth daily. 90 tablet 3 06/17/2024   magnesium  oxide (MAG-OX) 400 (240 Mg) MG tablet TAKE 1 TABLET TWICE DAILY . 180 tablet 3  06/16/2024   metoprolol  tartrate (LOPRESSOR ) 25 MG tablet Take 1 tablet (25 mg total) by mouth 2 (two) times daily. 180 tablet 2 06/17/2024   Multiple Vitamin (MULTIVITAMIN WITH MINERALS) TABS tablet Take 1 tablet by mouth daily.    06/17/2024   Multiple Vitamins-Minerals (PRESERVISION AREDS 2) CAPS Take 1 capsule by mouth 2 (two) times daily.    06/17/2024   pantoprazole  (PROTONIX ) 40 MG tablet Take 1 tablet (40 mg total) by mouth daily. 90 tablet 3 06/17/2024   potassium chloride  SA (KLOR-CON  M20) 20 MEQ tablet Take 2 tablets (40 mEq total) by mouth daily. 180 tablet 3 06/17/2024   venlafaxine  XR (EFFEXOR -XR) 75 MG 24 hr capsule TAKE 1 CAPSULE EVERY DAY 90 capsule 3 06/17/2024   amoxicillin (AMOXIL) 500 MG capsule Take 4 capsules by mouth as directed. Before dental appointment      Assessment: 78 yo male with PMH of A. Fib, HTN, HFrEF, obesity, and GERD. Patient was on apixaban  for A. Fib and stroke prophylaxis, but it has been discontinued for right and left heart catheterization planned  this week. Pharmacy has been consulted for heparin  infusion dosing and monitoring.  Last dose of apixaban  6/22 @ 0836.  **06/23 ~2200 RN reports hematuria, target low end of therapeutic range per Dr. Cleatus**  Goal of Therapy:  Heparin  level 0.3-0.5 units/ml aPTT 66-85 seconds Monitor platelets by anticoagulation protocol: Yes  Date Time Results Comments 6/23 0234 aPTT=145 HL = > 1.10 (not correlating), rate 1300 units/hr 6/23 1425 aPTT=84 Therapeutic, rate 1000 units/hour 6/23 2200   Change therapeutic target 2ry to hematuria 6/23 2238 aPTT=58 Subtherapeutic, rate 1000 units/hr 6/24 0630 aPTT=110 HL >1.10, SUPRA-therapeutic 1100 units/hours  Plan:  aPTT SUPRA-therapeutic and not correlating with HL. Will decrease heparin  infusion rate to 1000 units/hour Repeat aPTT 8hrs after rate change Check HL with AM labs Continue to monitor H&H and platelets with AM labs  Laranda Burkemper Rodriguez-Guzman PharmD, BCPS 06/21/2024 8:42 AM

## 2024-06-21 NOTE — Consult Note (Cosign Needed Addendum)
 Advanced Heart Failure Team Consult Note   Primary Physician: Myrla Jon HERO, MD Cardiologist:  Stanly DELENA Leavens, MD  Reason for Consultation: Acute systolic heart failure   HPI:    Eugene White is seen today for evaluation of acute systolic heart failure at the request of Dr. Darron, Cardiology.   78 y/o male w/ h/o PAF, HTN, ETOH use (reported on admission drinking 3-4 beers/day), BPH w/ urinary retention, recently required urology evaluation and placement of foley and started on tamsulosin  (PSA 4.59).  Prior echos have shows normal LVEF. Most recent 1/22 EF 55-60%, RV nl. No significant valvular abnormalties.   Pt came to the ED on 6/20 w/ complaints of progressive exertional fatigue and dyspnea that started ~3 weeks ago + unexplained wt gain, LEE and orthopnea. Diagnosed w/ acute CHF. BNP > 4500. LA 1.0>>1.7, Na 129, K 4.0, SCr 1.25 (b/l ~1.0), AST/ALT nl, Tbilil 1.3,  Hgb 11.7, MCV 100.3. Mg 1.6. EKG showed NSR w/ LBBB QRS 152 ms (LBBB new as of 5/25). Denies h/o CP. Hs trop 12>>17>>24. He has been able to detect, in the past, when he has been in Afib and he denies any recent symptoms of afib.   He was admitted and started on IV Lasix , good response thus far. Dyspnea and LEE improving but still volume overloaded.   Echo shows biventricular dysfunction. LVEF severely reduced 20-25% w/ global HK, mild LVH, RV moderately reduced, moderately elevated RVSP 54 mmhg, mod BAE w/ mod-severe MR.  He is scheduled for [facility] later today. AHF team asked to assist w/ further management.   Currently resting comfortably. Feeling better w/ diuresis. AKI resolved. SCr 0.97 today. K 3.8.  SBPs 110s. Wife present at bedside.     Echo 06/18/24 1. Left ventricular ejection fraction, by estimation, is 20 to 25%. Left  ventricular ejection fraction by 2D MOD biplane is 21.7 %. The left  ventricle has severely decreased function. The left ventricle demonstrates  global hypokinesis.  There is mild  left ventricular hypertrophy. Left ventricular diastolic parameters are  indeterminate. The average left ventricular global longitudinal strain is  -6.8 %.   2. Right ventricular systolic function is moderately reduced. The right  ventricular size is mildly enlarged. There is moderately elevated  pulmonary artery systolic pressure. The estimated right ventricular  systolic pressure is 53.6 mmHg.   3. Left atrial size was moderately dilated.   4. Right atrial size was moderately dilated.   5. Moderate pleural effusion in the left lateral region.   6. The mitral valve is normal in structure. Moderate to severe mitral  valve regurgitation. No evidence of mitral stenosis.   7. The aortic valve is calcified. There is moderate calcification of the  aortic valve. Aortic valve regurgitation is moderate. Aortic valve  sclerosis/calcification is present, without any evidence of aortic  stenosis. Aortic valve mean gradient measures  3.0 mmHg.   8. Pulmonic valve regurgitation is moderate.   9. The inferior vena cava is normal in size with greater than 50%  respiratory variability, suggesting right atrial pressure of 3 mmHg.   Home Medications Prior to Admission medications   Medication Sig Start Date End Date Taking? Authorizing Provider  acetaminophen  (TYLENOL ) 500 MG tablet Take 1,500 mg by mouth every 6 (six) hours as needed for moderate pain (pain score 4-6).   Yes [provider]  apixaban  (ELIQUIS ) 5 MG TABS tablet Take 1 tablet (5 mg total) by mouth 2 (two) times daily. 04/20/24  Yes Lucien, Tessa N, PA-C  atorvastatin  (LIPITOR) 20 MG tablet Take 1 tablet (20 mg total) by mouth daily. 04/20/24  Yes Bacigalupo, Jon HERO, MD  Cholecalciferol  (VITAMIN D ) 2000 UNITS CAPS Take 2,000 Units by mouth every evening.    Yes [provider]  furosemide  (LASIX ) 40 MG tablet Take 1 tablet (40 mg total) by mouth daily. 06/13/24  Yes Tammye Kahler-Robinson, Makiera, MD   levothyroxine  (SYNTHROID ) 100 MCG tablet Take 1 tablet (100 mcg total) by mouth daily. 01/05/24  Yes Bacigalupo, Jon HERO, MD  losartan  (COZAAR ) 50 MG tablet Take 1 tablet (50 mg total) by mouth daily. 05/16/24  Yes Chandrasekhar, Mahesh A, MD  magnesium  oxide (MAG-OX) 400 (240 Mg) MG tablet TAKE 1 TABLET TWICE DAILY . 05/16/24  Yes Chandrasekhar, Mahesh A, MD  metoprolol  tartrate (LOPRESSOR ) 25 MG tablet Take 1 tablet (25 mg total) by mouth 2 (two) times daily. 08/04/23  Yes Conte, Tessa N, PA-C  Multiple Vitamin (MULTIVITAMIN WITH MINERALS) TABS tablet Take 1 tablet by mouth daily.    Yes [provider]  Multiple Vitamins-Minerals (PRESERVISION AREDS 2) CAPS Take 1 capsule by mouth 2 (two) times daily.    Yes [provider]  pantoprazole  (PROTONIX ) 40 MG tablet Take 1 tablet (40 mg total) by mouth daily. 01/05/24  Yes Bacigalupo, Angela M, MD  potassium chloride  SA (KLOR-CON  M20) 20 MEQ tablet Take 2 tablets (40 mEq total) by mouth daily. 05/26/24  Yes Conte, Tessa N, PA-C  venlafaxine  XR (EFFEXOR -XR) 75 MG 24 hr capsule TAKE 1 CAPSULE EVERY DAY 01/05/24  Yes Bacigalupo, Angela M, MD  amoxicillin (AMOXIL) 500 MG capsule Take 4 capsules by mouth as directed. 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   First degree AV block    GERD (gastroesophageal reflux disease)    GI symptom    had nausea / vomited once / frequent stools / getting better   Hyperlipidemia    Hypertension    Hypothyroidism    Mild carotid artery disease (HCC)    Mitral  regurgitation    Mood changes    PAF (paroxysmal atrial fibrillation) (HCC)    Pneumonia    hx of   Tricuspid regurgitation    Tricuspid regurgitation     Past Surgical History: Past Surgical History:  Procedure Laterality Date   CARDIOVERSION N/A 01/12/2018   Procedure: CARDIOVERSION;  Surgeon: Maranda Leim DEL, MD;  Location[facility] ENDOSCOPY;  Service: Cardiovascular;  Laterality: N/A;   CARDIOVERSION N/A 01/26/2019   Procedure: CARDIOVERSION;  Surgeon: Jeffrie Oneil BROCKS, MD;  Location: [facility] ENDOSCOPY;  Service: Cardiovascular;  Laterality: N/A;   CARDIOVERSION N/A 09/30/2019   Procedure: CARDIOVERSION;  Surgeon: Pietro Redell RAMAN, MD;  Location: [facility] ENDOSCOPY;  Service: Cardiovascular;  Laterality: N/A;   CARDIOVERSION N/A 04/04/2022   Procedure: CARDIOVERSION;  Surgeon: Alvan Ronal BRAVO, MD;  Location[facility] ENDOSCOPY;  Service: Cardiovascular;  Laterality: N/A;   COLONOSCOPY  2014   CYSTOSCOPY WITH STENT PLACEMENT Left 06/18/2022   Procedure: CYSTOSCOPY WITH STENT PLACEMENT;  Surgeon: Penne Knee, MD;  Location: [facility] ORS;  Service: Urology;  Laterality: Left;   CYSTOSCOPY WITH STENT PLACEMENT Left 07/21/2022   Procedure: CYSTOSCOPY WITH STENT EXCHANGE;  Surgeon: Penne Knee, MD;  Location: [facility] ORS;  Service: Urology;  Laterality: Left;   FEMUR IM NAIL Left 10/11/2016   Procedure: INTRAMEDULLARY (IM) NAIL FEMORAL;  Surgeon: Norleen JINNY Maltos, MD;  Location: [facility] ORS;  Service: Orthopedics;  Laterality: Left;   HAND SURGERY   1973 / 2010 / 2015   right to release tendons   HARDWARE REMOVAL Left 10/05/2017   Procedure: Removal of left femoral nail;  Surgeon: Ernie Cough, MD;  Location: [facility] ORS;  Service: Orthopedics;  Laterality: Left;  90 mins   HEMATOMA EVACUATION Left 01/19/2018   Procedure: Irrigation and debridement, Evacuation of left total knee hematoma;  Surgeon: Ernie Cough, MD;  Location: [facility] ORS;  Service: Orthopedics;  Laterality: Left;   KNEE ARTHROSCOPY  1984 & 2001   twice    NASAL SEPTUM SURGERY     removal Left femoral nail      10/05/17 Dr. ERNIE   SHOULDER OPEN ROTATOR CUFF REPAIR  1999 & 2006   Right and left   TEE WITH CARDIOVERSION  01/12/2018   TEE WITHOUT CARDIOVERSION N/A 01/12/2018   Procedure: TRANSESOPHAGEAL ECHOCARDIOGRAM (TEE);  Surgeon: Maranda Leim DEL, MD;  Location[facility] ENDOSCOPY;  Service: Cardiovascular;  Laterality: N/A;   TONSILLECTOMY     TOTAL KNEE ARTHROPLASTY Right 01/09/2015   Procedure: RIGHT TOTAL KNEE ARTHROPLASTY;  Surgeon: Cough JONETTA Ernie, MD;  Location: [facility] ORS;  Service: Orthopedics;  Laterality: Right;   TOTAL KNEE ARTHROPLASTY Left 11/23/2017   Procedure: LEFT TOTAL KNEE ARTHROPLASTY;  Surgeon: Ernie Cough, MD;  Location: [facility] ORS;  Service: Orthopedics;  Laterality: Left;  90 mins   URETERAL BIOPSY Left 07/21/2022   Procedure: URETERAL BIOPSY;  Surgeon: Penne Knee, MD;  Location: [facility] ORS;  Service: Urology;  Laterality: Left;   URETEROSCOPY Left 07/21/2022   Procedure: URETEROSCOPY;  Surgeon: Penne Knee, MD;  Location: [facility] ORS;  Service: Urology;  Laterality: Left;    Family History: Family History  Problem Relation Age of Onset   Breast cancer Mother    Colon cancer Neg Hx    Esophageal cancer Neg Hx    Stomach cancer Neg Hx    Rectal cancer Neg Hx     Social History: Social History   Socioeconomic History   Marital status: Married    Spouse name: Scientist, product/process development   Number of children: 2   Years of education: Not on file   Highest education level: Professional school degree (e.g., MD, DDS, DVM, JD)  Occupational History   Occupation: retired, has degree in physiology  Tobacco Use   Smoking status: Former    Current packs/day: 0.00    Types: Cigarettes    Quit date: 12/29/1968    Years since quitting: 55.5   Smokeless tobacco: Never  Vaping Use   Vaping status: Never Used  Substance and Sexual Activity   Alcohol use: Yes    Alcohol/week: 21.0 standard drinks of alcohol    Types: 21 Cans of beer per week    Drug use: No   Sexual activity: Yes  Other Topics Concern   Not on file  Social History Narrative   Pt lives in [place] w/ wife.   Social Drivers of Corporate investment banker Strain: Low Risk  (10/08/2018)   Overall Financial Resource Strain (CARDIA)    Difficulty of Paying  Living Expenses: Not hard at all  Food Insecurity: No Food Insecurity (06/18/2024)   Hunger Vital Sign    Worried About Running Out of Food in the Last Year: Never true    Ran Out of Food in the Last Year: Never true  Transportation Needs: No Transportation Needs (06/18/2024)   PRAPARE - Administrator, Civil Service (Medical): No    Lack of Transportation (Non-Medical): No  Physical Activity: Inactive (07/13/2023)   Exercise Vital Sign    Days of Exercise per Week: 0 days    Minutes of Exercise per Session: 0 min  Stress: No Stress Concern Present (07/13/2023)   [employer] of Occupational Health - Occupational Stress Questionnaire    Feeling of Stress : Not at all  Social Connections: Socially Isolated (06/18/2024)   Social Connection and Isolation Panel    Frequency of Communication with Friends and Family: Twice a week    Frequency of Social Gatherings with Friends and Family: Never    Attends Religious Services: Never    Database administrator or Organizations: No    Attends Banker Meetings: Never    Marital Status: Married    Allergies:  Allergies  Allergen Reactions   Sulfa Antibiotics Other (See Comments)    Unknown/childhood allergy unknown    Objective:    Vital Signs:   Temp:  [97.4 F (36.3 C)-98.1 F (36.7 C)] 98.1 F (36.7 C) (06/24 0738) Pulse Rate:  [81-107] 85 (06/24 0738) Resp:  [18-20] 18 (06/24 0738) BP: (106-138)/(75-87) 138/75 (06/24 0738) SpO2:  [100 %] 100 % (06/24 0738) Weight:  [81.1 kg] 81.1 kg (06/24 0500) Last BM Date : 06/17/24  Weight change: Filed Weights   06/19/24 0500 06/20/24 0355 06/21/24 0500  Weight: 86.3 kg 85.9 kg  81.1 kg    Intake/Output:   Intake/Output Summary (Last 24 hours) at 06/21/2024 0739 Last data filed at 06/21/2024 0630 Gross per 24 hour  Intake 469 ml  Output 3150 ml  Net -2681 ml      Physical Exam    General:  fatigued appearing, elderly male w/ 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 JVP 10-12 cm.  Cor: PMI nondisplaced. Regular rate & rhythm. 2/6 MR murmur  Lungs: decreased BS at the bases bilaterally  Abdomen: soft, nontender, nondistended.  Extremities: no cyanosis, clubbing, rash. + 1-2+ b/l LEE Neuro: alert & orientedx3, cranial nerves grossly intact. moves all 4 extremities w/o difficulty. Affect pleasant   Telemetry   NSR 90s, personally reviewed   EKG    NSR LBBB, QRS 152 ms, Low voltage, personally reviewed   Labs   Basic Metabolic Panel: Recent Labs  Lab 06/17/24 1421 06/18/24 0053 06/19/24 0823 06/20/24 0234 06/21/24 0615  NA 129* 129* 134* 133* 135  K 4.2 4.0 3.2* 4.0 3.8  CL 98 98 101 99 98  CO2 20* 20* 25 26 28   GLUCOSE 123* 109* 108* 107* 105*  BUN 21 24* 21 18 14   CREATININE 1.18 1.25* 1.07 0.95 0.97  CALCIUM  8.9 8.8* 8.4* 8.7* 8.5*  MG  --   --  1.6* 1.7  --     Liver Function Tests: Recent Labs  Lab 06/17/24 1421 06/19/24 0823  AST 30 19  ALT 18 14  ALKPHOS 58 49  BILITOT 1.3* 1.0  PROT 5.8* 5.2*  ALBUMIN 3.7 3.4*   No results for input(s): LIPASE, AMYLASE in the last 168 hours. No results for input(s): AMMONIA in  the last 168 hours.  CBC: Recent Labs  Lab 06/17/24 1421 06/18/24 0053 06/19/24 0823 06/20/24 0234 06/21/24 0615  WBC 4.8 5.1 5.8 5.5 4.6  HGB 12.2* 11.7* 11.2* 11.3* 10.9*  HCT 34.5* 34.0* 32.0* 31.8* 31.1*  MCV 98.9 100.3* 100.3* 98.8 101.0*  PLT 212 197 162 178 171    Cardiac Enzymes: No results for input(s): CKTOTAL, CKMB, CKMBINDEX, TROPONINI in the last 168 hours.  BNP: BNP (last 3 results) Recent Labs    06/10/24 1521 06/17/24 1421  BNP  4,407.4* >4,500.0*    ProBNP (last 3 results) No results for input(s): PROBNP in the last 8760 hours.   CBG: No results for input(s): GLUCAP in the last 168 hours.  Coagulation Studies: No results for input(s): LABPROT, INR in the last 72 hours.   Imaging   No results found.   Medications:     Current Medications:  atorvastatin   20 mg Oral Daily   Chlorhexidine  Gluconate Cloth  6 each Topical Daily   furosemide   40 mg Intravenous BID   levothyroxine   100 mcg Oral Q0600   losartan   12.5 mg Oral Daily   metoprolol  succinate  25 mg Oral QPM   pantoprazole   40 mg Oral Daily   potassium chloride   40 mEq Oral BID   sodium chloride  flush  10-40 mL Intracatheter Q12H   spironolactone  12.5 mg Oral Daily   tamsulosin   0.4 mg Oral QPC supper   venlafaxine  XR  75 mg Oral Q breakfast    Infusions:  sodium chloride  10 mL/hr at 06/21/24 0630   heparin  1,100 Units/hr (06/21/24 0630)      Patient Profile   78 y/o male w/ h/o PAF, HTN, new LBBB, ETOH use (reported on admission drinking 3-4 beers/day) and BPH w/ urinary retention admitted w/ new systolic heart failure. Echo shows Bi-ventricular failure and mod-severe MR.   Assessment/Plan   1. Acute Systolic Heart Failure - new diagnosis. Previous echos w/ nl EF 55-60%, nl RV - Echo this admit EF 20-25%, RV mod reduced, global HK - etiology uncertain. Denies ischemic CP but w/ new LBBB, plan LHC today to exclude CAD. Other potential etiologies include ETOH induced vs LBBB mediated. Will also recommend cMRI if cath unrevealing to r/o prior myocarditis vs infiltrative process  - plan RHC today  - continue IV Lasix  40 mg bid  - Increase Spironolactone to 25 mg daily  - continue losartan  12.5 mg daily >>eventual [facility] - continue Toprol  XL for now. Does not appear low output but awaiting RHC. Will d/c if low CO/CI   - would avoid SGLT2i if he will need long term foley catheter   2. Mitral Regurgitation  - mod-severe on  TTE, likely functional w/ LA/LV dilation  - HF optimization per above - can reassess w/ limited echo once fully diuresed   3. ETOH Abuse - 3-4 beers/day. MCV 100 on admit, Mg low at 1.6 supporting habitual use - suspect contributing to CM - reduction of intake imperative  - monitor for withdrawal symptoms  4. PAF - in NSR, monitor on tele  - heparin  gtt until completion of invasive procedures - eventually back to Eliquis    5. BPH/Urinary Retention  - developed subsequent AKI and b/l hydronephrosis, improved w/ foley placement - PSA 4.6 - Urology following  - on tamsulosin    6. Hypokalemia/Hypomagnesemia  - K 3.8, Mg 1.7 - supp K and Mg w/ diuresis  - increase spiro to 25 mg   Length of Stay:  4  Caffie Shed, DEVONNA  06/21/2024, 7:39 AM    Advanced Heart Failure Team Pager [phone] (M-F; 7a - 5p)  Please contact [facility] for night-coverage after hours (4p -7a ) and weekends on [email or web address]

## 2024-06-21 NOTE — TOC Initial Note (Signed)
 Transition of Care [facility]) - Initial/Assessment Note    Patient Details  Name: Eugene White MRN: [id number] Date of Birth: Apr 15, 1946  Transition of Care [facility]) CM/SW Contact:    Fynlee Rowlands C Quaran Kedzierski, RN Phone Number: 06/21/2024, 4:14 PM  Clinical Narrative:                 Transition of Care Department [facility]) has reviewed patient. We will continue to monitor patient advancement. If new patient transition needs arise, please place a TOC consult.        Patient Goals and CMS Choice            Expected Discharge Plan and Services                                              Prior Living Arrangements/Services                       Activities of Daily Living   ADL Screening (condition at time of admission) Independently performs ADLs?: Yes (appropriate for developmental age) Is the patient deaf or have difficulty hearing?: No Does the patient have difficulty seeing, even when wearing glasses/contacts?: No Does the patient have difficulty concentrating, remembering, or making decisions?: No  Permission Sought/Granted                  Emotional Assessment              Admission diagnosis:  Shortness of breath [R06.02] Peripheral edema [R60.0] Generalized abdominal pain [R10.84] Alcohol use [F10.90] Acute CHF (congestive heart failure) (HCC) [I50.9] Patient Active Problem List   Diagnosis Date Noted   Acute HFrEF (heart failure with reduced ejection fraction) (HCC) 06/20/2024   Dilated cardiomyopathy (HCC) 06/19/2024   Peripheral edema 06/18/2024   Alcohol use 06/18/2024   Shortness of breath 06/18/2024   Acute CHF (congestive heart failure) (HCC) 06/17/2024   Obesity (BMI 30-39.9) 06/17/2024   Hyponatremia 06/17/2024   Ascites 06/17/2024   Anticoagulated 09/07/2023   Vitamin D  deficiency 09/07/2023   Unsteady gait 06/27/2022   Insomnia 02/14/2022   Impingement syndrome of left shoulder region 02/14/2019   Closed fracture of lateral  malleolus 02/01/2019   Overweight (BMI 25.0-29.9) 01/20/2018   GERD (gastroesophageal reflux disease)    Facial nerve injury, birth trauma    History of removal of retained hardware 10/06/2017   Left femur IM nail removal 10/05/2017   Paroxysmal atrial fibrillation (HCC) 10/05/2017   Closed intertrochanteric fracture of hip, left, initial encounter (HCC) 10/10/2016   Contracture of palmar fascia (Dupuytren's) 06/27/2015   DJD (degenerative joint disease) of knee 01/09/2015   Arthritis of knee, degenerative 01/09/2015   History of artificial joint 01/09/2015   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/09/2015   Generalized abdominal pain 06/02/2014   Anemia 06/02/2014   Hypothyroidism 05/31/2009   Essential (primary) hypertension 05/31/2009   Major depressive disorder with single episode 05/31/2009   Pure hypercholesterolemia 05/31/2009   Hydroureteronephrosis of left kidney 05/31/2009   PCP:  Myrla Jon HERO, MD Pharmacy:   [facility] [phone] [address], [place] - [address] [place] [address] Phone: [phone] Fax: [phone]  [facility] - [place], [place] - [address] [place] [address] Phone: [phone] Fax: [phone]     Social Drivers of Health (  SDOH) Social History: SDOH Screenings   Food Insecurity: No Food Insecurity (06/18/2024)  Housing: Low Risk  (06/18/2024)  Transportation Needs: No Transportation Needs (06/18/2024)  Utilities: Not At Risk (06/18/2024)  Alcohol Screen: Low Risk  (07/13/2023)  Depression (PHQ2-9): High Risk (06/10/2024)  Financial Resource Strain: Low Risk  (10/08/2018)  Physical Activity: Inactive (07/13/2023)  Social Connections: Socially Isolated (06/18/2024)  Stress: No Stress Concern Present (07/13/2023)  Tobacco Use: Medium Risk (06/17/2024)  Health Literacy: Adequate Health Literacy (07/13/2023)   SDOH Interventions:     Readmission Risk  Interventions     No data to display

## 2024-06-22 ENCOUNTER — Inpatient Hospital Stay: Payer: Medicare (Managed Care)

## 2024-06-22 DIAGNOSIS — I5021 Acute systolic (congestive) heart failure: Secondary | ICD-10-CM

## 2024-06-22 LAB — COMPREHENSIVE METABOLIC PANEL WITH GFR
ALT: 22 U/L (ref 0–44)
AST: 30 U/L (ref 15–41)
Albumin: 3.5 g/dL (ref 3.5–5.0)
Alkaline Phosphatase: 55 U/L (ref 38–126)
Anion gap: 11 (ref 5–15)
BUN: 15 mg/dL (ref 8–23)
CO2: 27 mmol/L (ref 22–32)
Calcium: 8.7 mg/dL — ABNORMAL LOW (ref 8.9–10.3)
Chloride: 97 mmol/L — ABNORMAL LOW (ref 98–111)
Creatinine, Ser: 0.89 mg/dL (ref 0.61–1.24)
GFR, Estimated: >60 mL/min (ref >60)
Glucose, Bld: 99 mg/dL (ref 70–99)
Potassium: 4.3 mmol/L (ref 3.5–5.1)
Sodium: 135 mmol/L (ref 135–145)
Total Bilirubin: 0.8 mg/dL (ref 0.0–1.2)
Total Protein: 5.7 g/dL — ABNORMAL LOW (ref 6.5–8.1)

## 2024-06-22 LAB — CBC
HCT: 31 % — ABNORMAL LOW (ref 39.0–52.0)
Hemoglobin: 10.8 g/dL — ABNORMAL LOW (ref 13.0–17.0)
MCH: 34.6 pg — ABNORMAL HIGH (ref 26.0–34.0)
MCHC: 34.8 g/dL (ref 30.0–36.0)
MCV: 99.4 fL (ref 80.0–100.0)
Platelets: 182 10*3/uL (ref 150–400)
RBC: 3.12 MIL/uL — ABNORMAL LOW (ref 4.22–5.81)
RDW: 15.2 % (ref 11.5–15.5)
WBC: 5 10*3/uL (ref 4.0–10.5)
nRBC: 0 % (ref 0.0–0.2)

## 2024-06-22 LAB — APTT
aPTT: 92 s — ABNORMAL HIGH (ref 24–36)
aPTT: 104 s — ABNORMAL HIGH (ref 24–36)

## 2024-06-22 LAB — MAGNESIUM: Magnesium: 1.5 mg/dL — ABNORMAL LOW (ref 1.7–2.4)

## 2024-06-22 LAB — HEPARIN LEVEL (UNFRACTIONATED): Heparin Unfractionated: >1.1 [IU]/mL — ABNORMAL HIGH (ref 0.30–0.70)

## 2024-06-22 MED ORDER — FUROSEMIDE 10 MG/ML IJ SOLN
80.0000 mg | Freq: Once | INTRAMUSCULAR | Status: AC
  Administered 2024-06-22: 80 mg via INTRAVENOUS
  Filled 2024-06-22: qty 8

## 2024-06-22 MED ORDER — APIXABAN 5 MG PO TABS
5.0000 mg | ORAL_TABLET | Freq: Two times a day (BID) | ORAL | Status: DC
  Administered 2024-06-22 – 2024-06-24 (×4): 5 mg via ORAL
  Filled 2024-06-22 (×4): qty 1

## 2024-06-22 MED ORDER — MAGNESIUM OXIDE -MG SUPPLEMENT 400 (240 MG) MG PO TABS
400.0000 mg | ORAL_TABLET | Freq: Two times a day (BID) | ORAL | Status: DC
  Administered 2024-06-23 – 2024-06-24 (×3): 400 mg via ORAL
  Filled 2024-06-22 (×3): qty 1

## 2024-06-22 MED ORDER — APIXABAN 5 MG PO TABS
5.0000 mg | ORAL_TABLET | Freq: Two times a day (BID) | ORAL | Status: DC
  Administered 2024-06-22: 5 mg via ORAL
  Filled 2024-06-22: qty 1

## 2024-06-22 MED ORDER — SACUBITRIL-VALSARTAN 24-26 MG PO TABS
1.0000 | ORAL_TABLET | Freq: Two times a day (BID) | ORAL | Status: DC
  Administered 2024-06-22 – 2024-06-24 (×5): 1 via ORAL
  Filled 2024-06-22 (×5): qty 1

## 2024-06-22 MED ORDER — ASPIRIN 81 MG PO TBEC
81.0000 mg | DELAYED_RELEASE_TABLET | Freq: Every day | ORAL | Status: DC
  Administered 2024-06-22: 81 mg via ORAL
  Filled 2024-06-22: qty 1

## 2024-06-22 MED ORDER — FUROSEMIDE 40 MG PO TABS
60.0000 mg | ORAL_TABLET | Freq: Every day | ORAL | Status: DC
  Administered 2024-06-23 – 2024-06-24 (×2): 60 mg via ORAL
  Filled 2024-06-22 (×2): qty 1

## 2024-06-22 MED ORDER — POTASSIUM CHLORIDE CRYS ER 20 MEQ PO TBCR
20.0000 meq | EXTENDED_RELEASE_TABLET | Freq: Every day | ORAL | Status: DC
  Administered 2024-06-23 – 2024-06-24 (×2): 20 meq via ORAL
  Filled 2024-06-22 (×2): qty 1

## 2024-06-22 MED ORDER — GADOBUTROL 1 MMOL/ML IV SOLN
10.0000 mL | Freq: Once | INTRAVENOUS | Status: AC | PRN
  Administered 2024-06-22: 10 mL via INTRAVENOUS

## 2024-06-22 MED ORDER — MAGNESIUM SULFATE 4 GM/100ML IV SOLN
4.0000 g | Freq: Once | INTRAVENOUS | Status: AC
  Administered 2024-06-22: 4 g via INTRAVENOUS
  Filled 2024-06-22: qty 100

## 2024-06-22 NOTE — Progress Notes (Addendum)
 Advanced Heart Failure Rounding Note  Cardiologist: Stanly DELENA Leavens, MD  HF Cardiologist: Morene Brownie, MD Chief Complaint: Acute Systolic Heart Failure Subjective:    3.5L UOP overnight. Weight down 17lbs since admission.   Lying in bed. Feeling tired this morning. Is not sleeping well, due to having a loud shared room. No SOB, CP. Able to lie flat.   Objective:    Weight Range: 78.4 kg Body mass index is 24.11 kg/m.   Vital Signs:   Temp:  [97.6 F (36.4 C)-98.1 F (36.7 C)] 97.6 F (36.4 C) (06/25 0427) Pulse Rate:  [85-108] 101 (06/25 0427) Resp:  [14-20] 18 (06/25 0427) BP: (118-145)/(62-103) 137/85 (06/25 0427) SpO2:  [93 %-100 %] 100 % (06/25 0427) Weight:  [78.4 kg-81.1 kg] 78.4 kg (06/25 0500) Last BM Date : 06/17/24  Weight change: Filed Weights   06/21/24 0500 06/21/24 0958 06/22/24 0500  Weight: 81.1 kg 81.1 kg 78.4 kg   Intake/Output:  Intake/Output Summary (Last 24 hours) at 06/22/2024 0736 Last data filed at 06/22/2024 0300 Gross per 24 hour  Intake 111.47 ml  Output 3575 ml  Net -3463.53 ml    Physical Exam    General: Elderly appearing. No distress on RA Cardiac: JVP ~8cm. S1 and S2 present. No murmurs or rub. Extremities: Warm and dry.  2+pitting edema R>L Neuro: Alert and oriented x3. Affect pleasant. Moves all extremities without difficulty. Lines/Devices:  L brachial midline  Telemetry   ST 100s with 1AVB (personally reviewed)  Labs    CBC Recent Labs    06/21/24 0615 06/21/24 1130 06/21/24 1134 06/22/24 0440  WBC 4.6  --   --  5.0  HGB 10.9*   < > 11.6* 10.8*  HCT 31.1*   < > 34.0* 31.0*  MCV 101.0*  --   --  99.4  PLT 171  --   --  182   < > = values in this interval not displayed.   Basic Metabolic Panel Recent Labs    93/76/74 0234 06/21/24 0615 06/21/24 1130 06/21/24 1134 06/22/24 0440  NA 133* 135   < > 135 135  K 4.0 3.8   < > 3.7 4.3  CL 99 98  --   --  97*  CO2 26 28  --   --  27  GLUCOSE  107* 105*  --   --  99  BUN 18 14  --   --  15  CREATININE 0.95 0.97  --   --  0.89  CALCIUM  8.7* 8.5*  --   --  8.7*  MG 1.7  --   --   --  1.5*   < > = values in this interval not displayed.   Liver Function Tests Recent Labs    06/19/24 0823 06/22/24 0440  AST 19 30  ALT 14 22  ALKPHOS 49 55  BILITOT 1.0 0.8  PROT 5.2* 5.7*  ALBUMIN 3.4* 3.5   BNP (last 3 results) Recent Labs    06/10/24 1521 06/17/24 1421  BNP 4,407.4* >4,500.0*   Medications:    Scheduled Medications:  atorvastatin   40 mg Oral Daily   Chlorhexidine  Gluconate Cloth  6 each Topical Daily   levothyroxine   100 mcg Oral Q0600   metoprolol  succinate  25 mg Oral QPM   pantoprazole   40 mg Oral Daily   polyethylene glycol  17 g Oral BID   potassium chloride   40 mEq Oral BID   sacubitril-valsartan   1 tablet Oral BID  sodium chloride  flush  10-40 mL Intracatheter Q12H   sodium chloride  flush  3 mL Intravenous Q12H   spironolactone  25 mg Oral Daily   tamsulosin   0.4 mg Oral QPC supper   venlafaxine  XR  75 mg Oral Q breakfast   Infusions:  sodium chloride      heparin  1,000 Units/hr (06/21/24 1549)   PRN Medications: sodium chloride , acetaminophen  **OR** acetaminophen , ondansetron  **OR** ondansetron  (ZOFRAN ) IV, senna-docusate, sodium chloride  flush, sodium chloride  flush, zolpidem   Patient Profile   78 y/o male w/ h/o PAF, HTN, new LBBB, ETOH use (reported on admission drinking 3-4 beers/day) and BPH w/ urinary retention admitted w/ new systolic heart failure. Echo shows Bi-ventricular failure and mod-severe MR.   Assessment/Plan   1. Acute Systolic Heart Failure - new diagnosis. Previous echos w/ nl EF 55-60%, nl RV - echo this admit EF 20-25%, RV mod reduced, global HK - etiology uncertain. Mixed vs. NICM by cath. Denies ischemic CP but w/ new LBBB. LHC with single vessel CAD. Other potential etiologies include ETOH induced vs LBBB mediated. With history of spinal stenosis and urinary retention,  could consider amyloidosis.  - RHC 06/21/24: RA 5, PA 43/22 (29), PCWP 20, CO/CI 5.8/2.9, PVR 1.6 - obtain CMR today - remains volume up by exam with high PCWP. Give Lasix  80 mg x1 today.  - continue spironolactone to 25 mg daily  - switch losartan  >> Entresto 24/26 mg bid, anticipate up titration this evening - continue Toprol -XL 25 mg daily - would avoid SGLT2i if he will need long term foley catheter  - place ted hose    2. Mitral Regurgitation  - mod-severe on TTE, likely functional w/ LA/LV dilation  - HF optimization per above - re-assess with limited echo tomorrow once fully diuresed.    3. ETOH Abuse - 3-4 beers/day. MCV 100 on admit, Mg low at 1.6 supporting habitual use - suspect contributing to CM - reduction of intake imperative  - monitor for withdrawal symptoms   4. PAF - in NSR, monitor on tele  - stop heparin ; restart Eliquis  5 mg bid    5. BPH/Urinary Retention  - developed subsequent AKI and b/l hydronephrosis, improved w/ foley placement - PSA 4.6 - Urology following  - on tamsulosin     6. Hypokalemia/Hypomagnesemia  - K 4.3, Mg 1.5 - supp Mg - resume home Mg Ox 400 mg bid  Length of Stay: 5  [place] Jarett Dralle, NP  06/22/2024, 7:36 AM  Advanced Heart Failure Team Pager [phone] (M-F; 7a - 5p)  Please contact [facility] for night-coverage after hours (5p -7a ) and weekends on [email or web address]

## 2024-06-22 NOTE — Progress Notes (Signed)
 Heart Failure Navigator Progress Note  Assessed for Heart & Vascular TOC clinic readiness.  Patient does not meet criteria due to current Advanced Heart Failure Team patient of Eugene Brownie, MD.   Navigator will sign off at this time.   Charmaine Pines, RN, BSN [facility] Heart Failure Navigator Secure Chat Only

## 2024-06-22 NOTE — Progress Notes (Signed)
 PHARMACY - ANTICOAGULATION CONSULT NOTE  Pharmacy Consult for Heparin  Infusion  Indication: atrial fibrillation  Allergies  Allergen Reactions   Sulfa Antibiotics Other (See Comments)    Unknown/childhood allergy unknown   Patient Measurements: Height: 5' 11 (180.3 cm) Weight: 81.1 kg (178 lb 12.7 oz) IBW/kg (Calculated) : 75.3 HEPARIN  DW (KG): 81.1  Vital Signs: Temp: 97.9 F (36.6 C) (06/25 0000) Temp Source: Oral (06/24 1600) BP: 133/87 (06/25 0000) Pulse Rate: 102 (06/25 0000)  Labs: Recent Labs    06/19/24 9176 06/19/24 0823 06/19/24 1759 06/20/24 0234 06/20/24 1425 06/20/24 2238 06/21/24 0615 06/21/24 0630 06/21/24 1130 06/21/24 1134 06/21/24 2310  HGB 11.2*  --   --  11.3*  --   --  10.9*  --  11.9* 11.6*  --   HCT 32.0*  --   --  31.8*  --   --  31.1*  --  35.0* 34.0*  --   PLT 162  --   --  178  --   --  171  --   --   --   --   APTT  --    < > 35 145*   < > 58*  --  110*  --   --  92*  HEPARINUNFRC  --   --  >1.10* >1.10*  --   --   --  >1.10*  --   --   --   CREATININE 1.07  --   --  0.95  --   --  0.97  --   --   --   --    < > = values in this interval not displayed.   Estimated Creatinine Clearance: 66.8 mL/min (by C-G formula based on SCr of 0.97 mg/dL).  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   First degree AV block    GERD (gastroesophageal reflux disease)    GI symptom    had nausea / vomited once / frequent stools / getting better   Hyperlipidemia    Hypertension    Hypothyroidism    Mild carotid artery disease (HCC)    Mitral regurgitation    Mood changes    PAF  (paroxysmal atrial fibrillation) (HCC)    Pneumonia    hx of   Tricuspid regurgitation    Tricuspid regurgitation     Medications:  Medications Prior to Admission  Medication Sig Dispense Refill Last Dose/Taking   acetaminophen  (TYLENOL ) 500 MG tablet Take 1,500 mg by mouth every 6 (six) hours as needed for moderate pain (pain score 4-6).   Taking As Needed   apixaban  (ELIQUIS ) 5 MG TABS tablet Take 1 tablet (5 mg total) by mouth 2 (two) times daily. 180 tablet 1 06/17/2024 at 10:30 AM   atorvastatin  (LIPITOR) 20 MG tablet Take 1 tablet (20 mg total) by mouth daily. 90 tablet 1 06/17/2024   Cholecalciferol  (VITAMIN D ) 2000 UNITS CAPS Take 2,000 Units by mouth every evening.    06/17/2024   furosemide  (LASIX ) 40 MG tablet Take 1 tablet (40 mg total) by mouth daily. 30 tablet 3 06/17/2024  levothyroxine  (SYNTHROID ) 100 MCG tablet Take 1 tablet (100 mcg total) by mouth daily. 90 tablet 2 06/17/2024   losartan  (COZAAR ) 50 MG tablet Take 1 tablet (50 mg total) by mouth daily. 90 tablet 3 06/17/2024   magnesium  oxide (MAG-OX) 400 (240 Mg) MG tablet TAKE 1 TABLET TWICE DAILY . 180 tablet 3 06/16/2024   metoprolol  tartrate (LOPRESSOR ) 25 MG tablet Take 1 tablet (25 mg total) by mouth 2 (two) times daily. 180 tablet 2 06/17/2024   Multiple Vitamin (MULTIVITAMIN WITH MINERALS) TABS tablet Take 1 tablet by mouth daily.    06/17/2024   Multiple Vitamins-Minerals (PRESERVISION AREDS 2) CAPS Take 1 capsule by mouth 2 (two) times daily.    06/17/2024   pantoprazole  (PROTONIX ) 40 MG tablet Take 1 tablet (40 mg total) by mouth daily. 90 tablet 3 06/17/2024   potassium chloride  SA (KLOR-CON  M20) 20 MEQ tablet Take 2 tablets (40 mEq total) by mouth daily. 180 tablet 3 06/17/2024   venlafaxine  XR (EFFEXOR -XR) 75 MG 24 hr capsule TAKE 1 CAPSULE EVERY DAY 90 capsule 3 06/17/2024   amoxicillin (AMOXIL) 500 MG capsule Take 4 capsules by mouth as directed. Before dental appointment      Assessment: 78 yo male with PMH of A.  Fib, HTN, HFrEF, obesity, and GERD. Patient was on apixaban  for A. Fib and stroke prophylaxis, but it has been discontinued for right and left heart catheterization planned this week. Pharmacy has been consulted for heparin  infusion dosing and monitoring.  Last dose of apixaban  6/22 @ 0836.  **06/23 ~2200 RN reports hematuria, target low end of therapeutic range per Dr. Cleatus**  Goal of Therapy:  Heparin  level 0.3-0.5 units/ml aPTT 66-85 seconds Monitor platelets by anticoagulation protocol: Yes  Date Time Results Comments 6/23 0234 aPTT=145 HL = > 1.10 (not correlating), rate 1300 units/hr 6/23 1425 aPTT=84 Therapeutic, rate 1000 units/hour 6/23 2200   Change therapeutic target 2ry to hematuria 6/23 2238 aPTT=58 Subtherapeutic, rate 1000 units/hr 6/24 0630 aPTT=110 HL >1.10, SUPRA-therapeutic 1100 units/hours 6/24 Resume 2 hrs after band removal 6/24 2310 aPTT 92 Therapeutic x 1   Plan:  Continue heparin  infusion at 1000 units/hour Repeat aPTT 8hrs to confirm Check HL with AM labs Continue to monitor H&H and platelets with AM labs  Rankin CANDIE Dills, PharmD, [facility] 06/22/2024 12:21 AM

## 2024-06-22 NOTE — Progress Notes (Signed)
 PHARMACY - ANTICOAGULATION CONSULT NOTE  Pharmacy Consult for Heparin  Infusion  Indication: atrial fibrillation  Allergies  Allergen Reactions   Sulfa Antibiotics Other (See Comments)    Unknown/childhood allergy unknown   Patient Measurements: Height: 5' 11 (180.3 cm) Weight: 78.4 kg (172 lb 13.5 oz) IBW/kg (Calculated) : 75.3 HEPARIN  DW (KG): 81.1  Vital Signs: Temp: 97.6 F (36.4 C) (06/25 0839) Temp Source: Oral (06/25 0839) BP: 121/94 (06/25 0839) Pulse Rate: 99 (06/25 0839)  Labs: Recent Labs    06/20/24 0234 06/20/24 1425 06/21/24 0615 06/21/24 0630 06/21/24 1130 06/21/24 1134 06/21/24 2310 06/22/24 0440 06/22/24 0820  HGB 11.3*  --  10.9*  --  11.9* 11.6*  --  10.8*  --   HCT 31.8*  --  31.1*  --  35.0* 34.0*  --  31.0*  --   PLT 178  --  171  --   --   --   --  182  --   APTT 145*   < >  --  110*  --   --  92*  --  104*  HEPARINUNFRC >1.10*  --   --  >1.10*  --   --   --   --  >1.10*  CREATININE 0.95  --  0.97  --   --   --   --  0.89  --    < > = values in this interval not displayed.   Estimated Creatinine Clearance: 72.9 mL/min (by C-G formula based on SCr of 0.89 mg/dL).  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   First degree AV block    GERD (gastroesophageal reflux disease)    GI symptom    had nausea / vomited once / frequent stools / getting better   Hyperlipidemia    Hypertension    Hypothyroidism    Mild carotid artery disease (HCC)    Mitral regurgitation    Mood changes    PAF (paroxysmal atrial fibrillation) (HCC)    Pneumonia    hx of   Tricuspid  regurgitation    Tricuspid regurgitation     Medications:  Medications Prior to Admission  Medication Sig Dispense Refill Last Dose/Taking   acetaminophen  (TYLENOL ) 500 MG tablet Take 1,500 mg by mouth every 6 (six) hours as needed for moderate pain (pain score 4-6).   Taking As Needed   apixaban  (ELIQUIS ) 5 MG TABS tablet Take 1 tablet (5 mg total) by mouth 2 (two) times daily. 180 tablet 1 06/17/2024 at 10:30 AM   atorvastatin  (LIPITOR) 20 MG tablet Take 1 tablet (20 mg total) by mouth daily. 90 tablet 1 06/17/2024   Cholecalciferol  (VITAMIN D ) 2000 UNITS CAPS Take 2,000 Units by mouth every evening.    06/17/2024   furosemide  (LASIX ) 40 MG tablet Take 1 tablet (40 mg total) by mouth daily. 30 tablet 3 06/17/2024   levothyroxine  (SYNTHROID ) 100 MCG tablet Take 1 tablet (100 mcg total) by mouth daily. 90 tablet 2 06/17/2024   losartan  (COZAAR ) 50 MG tablet Take 1 tablet (50 mg total) by mouth daily. 90 tablet 3 06/17/2024  magnesium  oxide (MAG-OX) 400 (240 Mg) MG tablet TAKE 1 TABLET TWICE DAILY . 180 tablet 3 06/16/2024   metoprolol  tartrate (LOPRESSOR ) 25 MG tablet Take 1 tablet (25 mg total) by mouth 2 (two) times daily. 180 tablet 2 06/17/2024   Multiple Vitamin (MULTIVITAMIN WITH MINERALS) TABS tablet Take 1 tablet by mouth daily.    06/17/2024   Multiple Vitamins-Minerals (PRESERVISION AREDS 2) CAPS Take 1 capsule by mouth 2 (two) times daily.    06/17/2024   pantoprazole  (PROTONIX ) 40 MG tablet Take 1 tablet (40 mg total) by mouth daily. 90 tablet 3 06/17/2024   potassium chloride  SA (KLOR-CON  M20) 20 MEQ tablet Take 2 tablets (40 mEq total) by mouth daily. 180 tablet 3 06/17/2024   venlafaxine  XR (EFFEXOR -XR) 75 MG 24 hr capsule TAKE 1 CAPSULE EVERY DAY 90 capsule 3 06/17/2024   amoxicillin (AMOXIL) 500 MG capsule Take 4 capsules by mouth as directed. Before dental appointment      Assessment: 78 yo male with PMH of A. Fib, HTN, HFrEF, obesity, and GERD. Patient was on apixaban  for A. Fib and  stroke prophylaxis, but it has been discontinued for right and left heart catheterization planned this week. Pharmacy has been consulted for heparin  infusion dosing and monitoring.  Last dose of apixaban  6/22 @ 0836.  **06/23 ~2200 RN reports hematuria, target low end of therapeutic range per Dr. Cleatus**  Goal of Therapy:  Heparin  level 0.3-0.5 units/ml aPTT 66-85 seconds Monitor platelets by anticoagulation protocol: Yes  Date Time Results Comments 6/23 0234 aPTT=145 HL = > 1.10 (not correlating), rate 1300 units/hr 6/23 1425 aPTT=84 Therapeutic, rate 1000 units/hour 6/23 2200   Change therapeutic target 2ry to hematuria 6/23 2238 aPTT=58 Subtherapeutic, rate 1000 units/hr 6/24 0630 aPTT=110 HL >1.10, SUPRA-therapeutic 1100 units/hours 6/24 Resume 2 hrs after band removal 6/24 2310 aPTT 92 Therapeutic x 1 6/25 0820 aPTT 104 SUPRA-therapeutic and HL not correlating   Plan:  Decrease heparin  infusion rate to 900 units/hour Repeat aPTT in 8hrs  Check HL with AM labs Continue to monitor H&H and platelets with AM labs  Eraina Winnie Rodriguez-Guzman PharmD, BCPS 06/22/2024 8:56 AM

## 2024-06-22 NOTE — Evaluation (Signed)
 Physical Therapy Evaluation Patient Details Name: Eugene White MRN: [id number] DOB: 08-05-1946 Today's Date: 06/22/2024  History of Present Illness  Eugene White is a 77yoM who comes to [facility] on 06/17/24 White SOB, leg swelling. PMH: HTN, PAF on eliquis , hypoTSH. At baseline can ambulate around with mild low back discomfort, not limited by dyspnea. Reports that he quit smoking about 55 years ago.  Clinical Impression  Eugene White reports being in the bed for nearly 5 days now, minimal mobility. Today assessment revealing that his baseline difficulties with leg weakness and imbalance are much worse than what is typical. Pt is able to perform entire session on room air, without dyspnea and without desaturation of blood oxygen. Spent several minutes practicing SPC AMB in room which provided myriad opportunities for LOB and self righting/recovery. Author did not allow for use of LUE on other objects for balancing, would've been too easy and moved attention away from hip and ankle righting retraining, clearly has some balance hypofunction at the peripheral sensory level. Pt later demonstrates steady marching in place with RW. Pt in recliner at EOS, wife at bedside.      If plan is discharge home, recommend the following: A little help with walking and/or transfers;A little help with bathing/dressing/bathroom;Direct supervision/assist for medications management;Assistance with cooking/housework;Help with stairs or ramp for entrance;Assist for transportation   Can travel by private vehicle        Equipment Recommendations None recommended by PT  Recommendations for Other Services       Functional Status Assessment Patient has had a recent decline in their functional status and demonstrates the ability to make significant improvements in function in a reasonable and predictable amount of time.     Precautions / Restrictions Precautions Precautions: Fall Recall of Precautions/Restrictions:  Intact Restrictions Weight Bearing Restrictions Per Provider Order: No      Mobility  Bed Mobility Overal bed mobility: Modified Independent Bed Mobility: Supine to Sit     Supine to sit: Modified independent (Device/Increase time)     General bed mobility comments: cues to keep bed flat for assessment    Transfers Overall transfer level: Needs assistance Equipment used: Rolling walker (2 wheels) Transfers: Sit to/from Stand Sit to Stand: Min assist           General transfer comment: limited by weakness of legs, chronic, but worse than baseline; can rise with MinA from elevated EOB; does better from recliner.    Ambulation/Gait Ambulation/Gait assistance: Contact guard assist Gait Distance (Feet): 70 Feet Assistive device: Straight cane         General Gait Details: near constant LOB, effective use of SPC sometimes, but has frequent LOB backwards which are less allevieated by SPC; (SpO2: 96% on room air; RN made aware.)  Stairs            Wheelchair Mobility     Tilt Bed    Modified Rankin (Stroke Patients Only)       Balance                                             Pertinent Vitals/Pain Pain Assessment Pain Assessment: No/denies pain    Home Living Family/patient expects to be discharged to::  (see OT evaluation)           Entrance Stairs-Number of Steps: 2  Prior Function                       Extremity/Trunk Assessment                Communication        Cognition Arousal: Alert Behavior During Therapy: WFL for tasks assessed/performed   PT - Cognitive impairments: No apparent impairments                                 Cueing       General Comments      Exercises     Assessment/Plan    PT Assessment Patient needs continued PT services  PT Problem List Decreased strength;Decreased range of motion;Decreased activity tolerance;Decreased  balance;Decreased mobility;Decreased coordination;Decreased cognition;Decreased knowledge of use of DME;Decreased safety awareness;Decreased knowledge of precautions       PT Treatment Interventions DME instruction;Gait training;Stair training;Functional mobility training;Therapeutic activities;Therapeutic exercise;Balance training;Neuromuscular re-education;Patient/family education    PT Goals (Current goals can be found in the Care Plan section)  Acute Rehab PT Goals Patient Stated Goal: regain baseline mobility independence PT Goal Formulation: With patient Time For Goal Achievement: 07/06/24 Potential to Achieve Goals: Good    Frequency Min 2X/week     Co-evaluation               AM-PAC PT 6 Clicks Mobility  Outcome Measure Help needed turning from your back to your side while in a flat bed without using bedrails?: A Little Help needed moving from lying on your back to sitting on the side of a flat bed without using bedrails?: A Little Help needed moving to and from a bed to a chair (including a wheelchair)?: A Lot Help needed standing up from a chair using your arms (e.g., wheelchair or bedside chair)?: A Lot Help needed to walk in hospital room?: A Lot Help needed climbing 3-5 steps with a railing? : A Little 6 Click Score: 15    End of Session Equipment Utilized During Treatment: Oxygen Activity Tolerance: Patient tolerated treatment well;No increased pain Patient left: in bed;with call bell/phone within reach;with family/visitor present Nurse Communication: Mobility status PT Visit Diagnosis: Difficulty in walking, not elsewhere classified (R26.2);Other abnormalities of gait and mobility (R26.89)    Time: 8997-8974 PT Time Calculation (min) (ACUTE ONLY): 23 min   Charges:   PT Evaluation $PT Eval Moderate Complexity: 1 Mod PT Treatments $Neuromuscular Re-education: 8-22 mins PT General Charges $$ ACUTE PT VISIT: 1 Visit    10:44 AM, 06/22/24 Eugene White, PT, DPT Physical Therapist - [facility]  [phone] (ASCOM)    Eugene White 06/22/2024, 10:39 AM

## 2024-06-22 NOTE — Plan of Care (Signed)
  Problem: Education: Goal: Knowledge of General Education information will improve Description: Including pain rating scale, medication(s)/side effects and non-pharmacologic comfort measures Outcome: Progressing   Problem: Clinical Measurements: Goal: Will remain free from infection Outcome: Progressing   Problem: Activity: Goal: Risk for activity intolerance will decrease Outcome: Progressing   Problem: Nutrition: Goal: Adequate nutrition will be maintained Outcome: Progressing   Problem: Pain Managment: Goal: General experience of comfort will improve and/or be controlled Outcome: Progressing   Problem: Safety: Goal: Ability to remain free from injury will improve Outcome: Progressing

## 2024-06-22 NOTE — Telephone Encounter (Signed)
Pt is currently inpatient.

## 2024-06-22 NOTE — Progress Notes (Signed)
 PROGRESS NOTE    Eugene White  FMW:[id number] DOB: 03/11/1946 DOA: 06/17/2024 PCP: Myrla Jon HERO, MD    Brief Narrative:   78 y.o. male with a PMH significant for HTN, obesity, pAF on Eliquis , hypothyroidism who presented with SOB, swelling, fatigue.  Patient lost about 50 lbs over the last year, but in the last month, has gained 20 lbs, and during the same time, has noticed swelling in his abdomen and legs.  Finally, he went to PCP's office, we here he was noted to have BNP >4000, and started on Lasix .  He trialed this at home and had marginal improvement, but was still too SOB to walk across the room so he came to the ER.  Started on IV Lasix  and TRH called to admit.     Assessment & Plan:   Principal Problem:   Acute HFrEF (heart failure with reduced ejection fraction) (HCC) Active Problems:   Paroxysmal atrial fibrillation (HCC)   Generalized abdominal pain   Hypothyroidism   Essential (primary) hypertension   Acute CHF (congestive heart failure) (HCC)   Obesity (BMI 30-39.9)   Hyponatremia   Ascites   Peripheral edema   Alcohol use   Shortness of breath   Dilated cardiomyopathy (HCC)  Acute systolic CHF (congestive heart failure) (HCC) New diagnosis.  Echocardiogram 2025%.  Etiology is uncertain.  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  New left bundle branch block. Plan: Cardiac MRI today Lasix  80 mg x 1 today Aldactone 25 mg daily Entresto 24/26 mg twice daily Toprol  25 mg daily TED hose  Cardiomyopathy: See above   Paroxysmal atrial fibrillation (HCC) Here in sinus rhythm.  On telemetry.  Eliquis  restarted.  Heparin  GTT stopped for   BPH: Catheter placed in-house by urology on 6/21.  Urine output improved.  Flomax  to continue.  Finasteride to be considered.  Will consider Foley discontinuation as early as 6/26 or when IV diuresis is concluded.   Daily alcohol use: No evidence of withdrawal.  Could be underlying  cardiomyopathy.   Elevated Cr: Resolved.  Likely obstructive.  Continue Foley catheter.   Ascites - Suspect cardiogenic.  But he reports daily alcohol use. - numerous liver cysts noted without frank fibrosis/cirrhosis noted on liver ultrasound   Hyponatremia Much improved with diuresis.  Continue diuresis as directed by heart failure service.   Hypokalemia/hypomagnesemia: Replace as needed   Macrocytic anemia: No bleeding noted.  Anemia longstanding and chronic.   Obesity (BMI 30-39.9) Recent weight loss   Essential (primary) hypertension Metoprolol , Lasix , Entresto, Aldactone   Hypothyroidism Synthroid    DVT prophylaxis: Eliquis  Code Status: Full Family Communication: Spouse at bedside 6/25, daughter Dr. Cherene Goodell via phone 06/22/24 Disposition Plan: Status is: Inpatient Remains inpatient appropriate because: Heart failure on IV diuretic   Level of care: Telemetry Cardiac  Consultants:  Cardiology-heart failure  Procedures:  Left right heart catheterization  Antimicrobials: None   Subjective: Seen and examined.  Resting in bed.  Reports ambulating physical therapy this morning.  Reports poor sleep last night.  Objective: Vitals:   06/22/24 0500 06/22/24 0839 06/22/24 1029 06/22/24 1115  BP:  (!) 121/94  107/86  Pulse:  99  (!) 102  Resp:    18  Temp:  97.6 F (36.4 C)  (!) 97.5 F (36.4 C)  TempSrc:  Oral    SpO2:  100% 96% 99%  Weight: 78.4 kg     Height:        Intake/Output Summary (Last 24 hours) at  06/22/2024 1400 Last data filed at 06/22/2024 1117 Gross per 24 hour  Intake 351.47 ml  Output 3400 ml  Net -3048.53 ml   Filed Weights   06/21/24 0500 06/21/24 0958 06/22/24 0500  Weight: 81.1 kg 81.1 kg 78.4 kg    Examination:  General exam: NAD Respiratory system: Basal crackles.  Normal work of breathing.  Room air Cardiovascular system: 1 S2, distant heart sounds, no appreciable murmurs, no pedal edema Gastrointestinal system:  Soft, NT/ND, normal bowel sounds Central nervous system: Alert and oriented. No focal neurological deficits. Extremities: Decreased power bilateral lower extremities.  Gait not assessed Skin: No rashes, lesions or ulcers Psychiatry: Judgement and insight appear normal. Mood & affect appropriate.     Data Reviewed: I have personally reviewed following labs and imaging studies  CBC: Recent Labs  Lab 06/18/24 0053 06/19/24 0823 06/20/24 0234 06/21/24 0615 06/21/24 1130 06/21/24 1134 06/22/24 0440  WBC 5.1 5.8 5.5 4.6  --   --  5.0  HGB 11.7* 11.2* 11.3* 10.9* 11.9* 11.6* 10.8*  HCT 34.0* 32.0* 31.8* 31.1* 35.0* 34.0* 31.0*  MCV 100.3* 100.3* 98.8 101.0*  --   --  99.4  PLT 197 162 178 171  --   --  182   Basic Metabolic Panel: Recent Labs  Lab 06/18/24 0053 06/19/24 0823 06/20/24 0234 06/21/24 0615 06/21/24 1130 06/21/24 1134 06/22/24 0440  NA 129* 134* 133* 135 135 135 135  K 4.0 3.2* 4.0 3.8 3.7 3.7 4.3  CL 98 101 99 98  --   --  97*  CO2 20* 25 26 28   --   --  27  GLUCOSE 109* 108* 107* 105*  --   --  99  BUN 24* 21 18 14   --   --  15  CREATININE 1.25* 1.07 0.95 0.97  --   --  0.89  CALCIUM  8.8* 8.4* 8.7* 8.5*  --   --  8.7*  MG  --  1.6* 1.7  --   --   --  1.5*   GFR: Estimated Creatinine Clearance: 72.9 mL/min (by C-G formula based on SCr of 0.89 mg/dL). Liver Function Tests: Recent Labs  Lab 06/17/24 1421 06/19/24 0823 06/22/24 0440  AST 30 19 30   ALT 18 14 22   ALKPHOS 58 49 55  BILITOT 1.3* 1.0 0.8  PROT 5.8* 5.2* 5.7*  ALBUMIN 3.7 3.4* 3.5   No results for input(s): LIPASE, AMYLASE in the last 168 hours. No results for input(s): AMMONIA in the last 168 hours. Coagulation Profile: Recent Labs  Lab 06/17/24 1421  INR 1.6*   Cardiac Enzymes: No results for input(s): CKTOTAL, CKMB, CKMBINDEX, TROPONINI in the last 168 hours. BNP (last 3 results) No results for input(s): PROBNP in the last 8760 hours. HbA1C: No results for  input(s): HGBA1C in the last 72 hours. CBG: No results for input(s): GLUCAP in the last 168 hours. Lipid Profile: No results for input(s): CHOL, HDL, LDLCALC, TRIG, CHOLHDL, LDLDIRECT in the last 72 hours. Thyroid  Function Tests: No results for input(s): TSH, T4TOTAL, FREET4, T3FREE, THYROIDAB in the last 72 hours. Anemia Panel: No results for input(s): VITAMINB12, FOLATE, FERRITIN, TIBC, IRON, RETICCTPCT in the last 72 hours. Sepsis Labs: Recent Labs  Lab 06/17/24 1711 06/18/24 0053  LATICACIDVEN 1.8 1.7    No results found for this or any previous visit (from the past 240 hours).       Radiology Studies: CARDIAC CATHETERIZATION Result Date: 06/21/2024 Conclusions: Severe single-vessel coronary artery disease with diffusely  diseased LAD with focal stenoses in the mid and distal vessel of up to 70%.  Ramus intermedius, LCx, and RCA demonstrate mild-moderate, nonobstructive coronary artery disease.  Degree of LVEF reduction is out of proportion to CAD burden suggestive of predominantly nonischemic cardiomyopathy. Mildly elevated left heart filling pressures (PCWP 20 mmHg). Normal to mildly elevated right heart filling pressures (mean RA 5 mmHg, RVEDP 7 mmHg). Mild to moderate pulmonary hypertension (PAP 43/22, mean 29 mmHg). Normal Fick cardiac output/index (CO 5.8 L/min, CI 2.9 L/min/m). Recommendations: Continue gentle diuresis and optimization of evidence-based heart failure therapy. Favor medical therapy of diffuse LAD disease.  If the patient were to develop angina, PCI to the LAD could be considered though disease is fairly diffuse and vessel is relatively small. Aggressive secondary prevention of coronary artery disease. Resume heparin  infusion 2 hours after TR band removal.  Transition to apixaban  as soon as tomorrow if there are no other invasive procedures planned and no evidence of bleeding/vascular injury at catheterization sites. Lonni Hanson, MD [facility]       Scheduled Meds:  atorvastatin   40 mg Oral Daily   Chlorhexidine  Gluconate Cloth  6 each Topical Daily   levothyroxine   100 mcg Oral Q0600   [START ON 06/23/2024] magnesium  oxide  400 mg Oral BID   metoprolol  succinate  25 mg Oral QPM   pantoprazole   40 mg Oral Daily   polyethylene glycol  17 g Oral BID   potassium chloride   40 mEq Oral BID   sacubitril-valsartan   1 tablet Oral BID   sodium chloride  flush  10-40 mL Intracatheter Q12H   sodium chloride  flush  3 mL Intravenous Q12H   spironolactone  25 mg Oral Daily   tamsulosin   0.4 mg Oral QPC supper   venlafaxine  XR  75 mg Oral Q breakfast   Continuous Infusions:   LOS: 5 days      Eugene KATHEE Robson, MD [facility]   If 7PM-7AM, please contact night-coverage  06/22/2024, 2:00 PM

## 2024-06-22 NOTE — Progress Notes (Signed)
 OT Cancellation Note  Patient Details Name: FLEETWOOD PIERRON MRN: [id number] DOB: [birth year]   Cancelled Treatment:    Reason Eval/Treat Not Completed: Other (comment). Chart reviewed to date and treatment session attempted. Pt adamantly refusing at this time stating he already walked with PT. States he is to have a cardiac MRI this afternoon. Will check back if time allows.  Harly Pipkins E Markham Dumlao 06/22/2024, 12:01 PM

## 2024-06-23 DIAGNOSIS — I5021 Acute systolic (congestive) heart failure: Secondary | ICD-10-CM | POA: Diagnosis not present

## 2024-06-23 LAB — BASIC METABOLIC PANEL WITH GFR
Anion gap: 9 (ref 5–15)
BUN: 16 mg/dL (ref 8–23)
CO2: 29 mmol/L (ref 22–32)
Calcium: 8.9 mg/dL (ref 8.9–10.3)
Chloride: 92 mmol/L — ABNORMAL LOW (ref 98–111)
Creatinine, Ser: 0.99 mg/dL (ref 0.61–1.24)
GFR, Estimated: >60 mL/min (ref >60)
Glucose, Bld: 121 mg/dL — ABNORMAL HIGH (ref 70–99)
Potassium: 4 mmol/L (ref 3.5–5.1)
Sodium: 130 mmol/L — ABNORMAL LOW (ref 135–145)

## 2024-06-23 LAB — CBC WITH DIFFERENTIAL/PLATELET
Abs Immature Granulocytes: 0.02 10*3/uL (ref 0.00–0.07)
Basophils Absolute: 0 10*3/uL (ref 0.0–0.1)
Basophils Relative: 1 %
Eosinophils Absolute: 0.4 10*3/uL (ref 0.0–0.5)
Eosinophils Relative: 8 %
HCT: 33.5 % — ABNORMAL LOW (ref 39.0–52.0)
Hemoglobin: 11.5 g/dL — ABNORMAL LOW (ref 13.0–17.0)
Immature Granulocytes: 0 %
Lymphocytes Relative: 17 %
Lymphs Abs: 0.9 10*3/uL (ref 0.7–4.0)
MCH: 34.3 pg — ABNORMAL HIGH (ref 26.0–34.0)
MCHC: 34.3 g/dL (ref 30.0–36.0)
MCV: 100 fL (ref 80.0–100.0)
Monocytes Absolute: 0.6 10*3/uL (ref 0.1–1.0)
Monocytes Relative: 12 %
Neutro Abs: 3.4 10*3/uL (ref 1.7–7.7)
Neutrophils Relative %: 62 %
Platelets: 170 10*3/uL (ref 150–400)
RBC: 3.35 MIL/uL — ABNORMAL LOW (ref 4.22–5.81)
RDW: 15 % (ref 11.5–15.5)
WBC: 5.3 10*3/uL (ref 4.0–10.5)
nRBC: 0 % (ref 0.0–0.2)

## 2024-06-23 LAB — MAGNESIUM: Magnesium: 2.1 mg/dL (ref 1.7–2.4)

## 2024-06-23 LAB — LIPOPROTEIN A (LPA): Lipoprotein (a): 10 nmol/L (ref <75.0)

## 2024-06-23 NOTE — Plan of Care (Signed)

## 2024-06-23 NOTE — Progress Notes (Addendum)
 Advanced Heart Failure Rounding Note  Cardiologist: Stanly DELENA Leavens, MD  HF Cardiologist: Morene Brownie, MD Chief Complaint: Acute Systolic Heart Failure Subjective:    3L UOP overnight. Weight down 20lbs since admission.   Laying in bed. Foley removed early this morning, has yet to void. Denies CP/SOB. Has been getting up and ambulating in his room.   Objective:    Weight Range: 77.1 kg Body mass index is 23.71 kg/m.   Vital Signs:   Temp:  [97.5 F (36.4 C)-98.3 F (36.8 C)] 97.6 F (36.4 C) (06/26 0824) Pulse Rate:  [85-110] 104 (06/26 0824) Resp:  [18-20] 18 (06/26 0824) BP: (95-117)/(71-86) 117/85 (06/26 0824) SpO2:  [96 %-99 %] 97 % (06/26 0824) Weight:  [77.1 kg] 77.1 kg (06/26 0500) Last BM Date : 06/22/24  Weight change: Filed Weights   06/21/24 0958 06/22/24 0500 06/23/24 0500  Weight: 81.1 kg 78.4 kg 77.1 kg   Intake/Output:  Intake/Output Summary (Last 24 hours) at 06/23/2024 0947 Last data filed at 06/23/2024 0500 Gross per 24 hour  Intake 240 ml  Output 3050 ml  Net -2810 ml    Physical Exam   General:  elderly appearing.  No respiratory difficulty Neck: supple. JVD ~7 cm.  Cor: PMI nondisplaced. Regular rate & rhythm. No rubs, gallops or murmurs. Lungs: clear Extremities: no cyanosis, clubbing, rash, +1-2 RLE edema  Neuro: alert & oriented x 3. Moves all 4 extremities w/o difficulty. Affect pleasant.   Telemetry   NSR 80s-90s (personally reviewed)  Labs    CBC Recent Labs    06/21/24 0615 06/21/24 1130 06/21/24 1134 06/22/24 0440  WBC 4.6  --   --  5.0  HGB 10.9*   < > 11.6* 10.8*  HCT 31.1*   < > 34.0* 31.0*  MCV 101.0*  --   --  99.4  PLT 171  --   --  182   < > = values in this interval not displayed.   Basic Metabolic Panel Recent Labs    93/75/74 0615 06/21/24 1130 06/21/24 1134 06/22/24 0440  NA 135   < > 135 135  K 3.8   < > 3.7 4.3  CL 98  --   --  97*  CO2 28  --   --  27  GLUCOSE 105*  --   --  99   BUN 14  --   --  15  CREATININE 0.97  --   --  0.89  CALCIUM  8.5*  --   --  8.7*  MG  --   --   --  1.5*   < > = values in this interval not displayed.   Liver Function Tests Recent Labs    06/22/24 0440  AST 30  ALT 22  ALKPHOS 55  BILITOT 0.8  PROT 5.7*  ALBUMIN 3.5   BNP (last 3 results) Recent Labs    06/10/24 1521 06/17/24 1421  BNP 4,407.4* >4,500.0*   Medications:    Scheduled Medications:  apixaban   5 mg Oral BID   atorvastatin   40 mg Oral Daily   Chlorhexidine  Gluconate Cloth  6 each Topical Daily   furosemide   60 mg Oral Daily   levothyroxine   100 mcg Oral Q0600   magnesium  oxide  400 mg Oral BID   metoprolol  succinate  25 mg Oral QPM   pantoprazole   40 mg Oral Daily   polyethylene glycol  17 g Oral BID   potassium chloride   20 mEq Oral  Daily   sacubitril-valsartan   1 tablet Oral BID   sodium chloride  flush  10-40 mL Intracatheter Q12H   sodium chloride  flush  3 mL Intravenous Q12H   spironolactone  25 mg Oral Daily   tamsulosin   0.4 mg Oral QPC supper   venlafaxine  XR  75 mg Oral Q breakfast   Infusions:   PRN Medications: acetaminophen  **OR** acetaminophen , ondansetron  **OR** ondansetron  (ZOFRAN ) IV, senna-docusate, sodium chloride  flush, sodium chloride  flush, zolpidem   Patient Profile   78 y/o male w/ h/o PAF, HTN, new LBBB, ETOH use (reported on admission drinking 3-4 beers/day) and BPH w/ urinary retention admitted w/ new systolic heart failure. Echo shows Bi-ventricular failure and mod-severe MR.   Assessment/Plan   1. Acute Systolic Heart Failure - new diagnosis. Previous echos w/ nl EF 55-60%, nl RV - echo this admit EF 20-25%, RV mod reduced, global HK - etiology uncertain. Mixed vs. NICM by cath. Denies ischemic CP but w/ new LBBB. LHC with single vessel CAD. Other potential etiologies include ETOH induced vs LBBB mediated. With history of spinal stenosis and urinary retention, could consider amyloidosis.  - RHC 06/21/24: RA 5, PA  43/22 (29), PCWP 20, CO/CI 5.8/2.9, PVR 1.6 - CMRi read pending - Appears euvolemic. PO diuretics to start today. Torsemide 60 mg daily  - continue spironolactone 25 mg daily  - Continue Entresto 24/26 mg bid - continue Toprol -XL 25 mg daily - SGLT2i pending UOP. Hesitant to start with BPH and hx of urinary retention.  - place ted hose    2. Mitral Regurgitation  - mod-severe on TTE, likely functional w/ LA/LV dilation  - HF optimization per above - Follow up on cMRI, read pending   3. ETOH Abuse - 3-4 beers/day. MCV 100 on admit, Mg low at 1.6 supporting habitual use - suspect contributing to CM - reduction of intake imperative  - monitor for withdrawal symptoms   4. PAF - in NSR, monitor on tele  - Continue Eliquis  5 mg bid    5. BPH/Urinary Retention  - developed subsequent AKI and b/l hydronephrosis, improved w/ foley placement - PSA 4.6 - Urology following  - on tamsulosin     6. Hypokalemia/Hypomagnesemia  - K 4.3, Mg 1.5 - supp Mg - resume home Mg Ox 400 mg bid  06/23/24 2:03 PM  Patient has now voided. Plan for bladder scan prior to discharge, otherwise he is good to go from AHF standpoint. Will arrange close follow up in AHF clinic.   AHF meds at discharge:  Eliquis  5 mg BID Atorvastatin  40 mg daily Lasix  60 mg daily Toprol  XL 25 mg at bedtime Entresto 24-26 mg BID Spironolactone 25 mg daily  Length of Stay: 6  Beckey LITTIE Coe, NP  06/23/2024, 9:47 AM  Advanced Heart Failure Team Pager [phone] (M-F; 7a - 5p)  Please contact [facility] for night-coverage after hours (5p -7a ) and weekends on [email or web address]  Patient seen and examined with the above-signed Advanced Practice Provider and/or Housestaff. I personally reviewed laboratory data, imaging studies and relevant notes. I independently examined the patient and formulated the important aspects of the plan. I have edited the note to reflect any of my changes or salient points. I have personally discussed the  plan with the patient and/or family.  Has diuresed well. Feeling much better. Denies SOB, orthopnea or PND  General:  Elderly male resting comfortably in bed No resp difficulty HEENT: normal Neck: supple. no JVD. Carotids 2+ bilat; no bruits. No lymphadenopathy or  thryomegaly appreciated. Cor:  Regular rate & rhythm. No rubs, gallops or murmurs. Lungs: clear Abdomen: soft, nontender, nondistended. No hepatosplenomegaly. No bruits or masses. Good bowel sounds. Extremities: no cyanosis, clubbing, rash, edema Neuro: alert & orientedx3, cranial nerves grossly intact. moves all 4 extremities w/o difficulty. Affect pleasant  He is much improved. On good GDMT. Reviewed with HF PharmD. Stable for d/c from HF perspective.   HF team will s/o. We will arrange for outpatient f/u next week.   Toribio Fuel, MD  6:45 PM

## 2024-06-23 NOTE — Progress Notes (Signed)
 PROGRESS NOTE    Eugene White  FMW:[id number] DOB: 1946-03-02 DOA: 06/17/2024 PCP: Myrla Jon HERO, MD    Brief Narrative:   78 y.o. male with a PMH significant for HTN, obesity, pAF on Eliquis , hypothyroidism who presented with SOB, swelling, fatigue.  Patient lost about 50 lbs over the last year, but in the last month, has gained 20 lbs, and during the same time, has noticed swelling in his abdomen and legs.  Finally, he went to PCP's office, we here he was noted to have BNP >4000, and started on Lasix .  He trialed this at home and had marginal improvement, but was still too SOB to walk across the room so he came to the ER.  Started on IV Lasix  and TR[facility] called to admit.     Assessment & Plan:   Principal Problem:   Acute HFrEF (heart failure with reduced ejection fraction) (HCC) Active Problems:   Paroxysmal atrial fibrillation (HCC)   Generalized abdominal pain   Hypothyroidism   Essential (primary) hypertension   Acute CHF (congestive heart failure) (HCC)   Obesity (BMI 30-39.9)   Hyponatremia   Ascites   Peripheral edema   Alcohol use   Shortness of breath   Dilated cardiomyopathy (HCC)  Acute systolic CHF (congestive heart failure) (HCC) New diagnosis.  Echocardiogram 2025%.  Etiology is uncertain.  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  New left bundle branch block.  Cardiac MRI unrevealing.  No evidence of infiltrative CMO seen on imaging Plan: Lasix  60mg  PO every day  Aldactone 25 mg daily Entresto 24/26 mg twice daily Toprol  25 mg daily TED hose  Cardiomyopathy: See above   Paroxysmal atrial fibrillation (HCC) Here in sinus rhythm.  On telemetry.  Eliquis  restarted.  Heparin  GTT stopped for   BPH: Catheter placed in-house by urology on 6/21.  Urine output improved.  Flomax  to continue.  Finasteride to be considered.  Foley dc'd on 6/26 AM.  UOP has been minimal with ~250 PVR.   Daily alcohol use: No evidence of  withdrawal.  Could be underlying cardiomyopathy.   Elevated Cr: Resolved.  Likely obstructive.  Continue Foley catheter.   Ascites - Suspect cardiogenic.  But he reports daily alcohol use. - numerous liver cysts noted without frank fibrosis/cirrhosis noted on liver ultrasound   Hyponatremia Much improved with diuresis.  Continue diuresis as directed by heart failure service.   Hypokalemia/hypomagnesemia: Replace as needed   Macrocytic anemia: No bleeding noted.  Anemia longstanding and chronic.   Obesity (BMI 30-39.9) Recent weight loss   Essential (primary) hypertension Metoprolol , Lasix , Entresto, Aldactone   Hypothyroidism Synthroid    DVT prophylaxis: Eliquis  Code Status: Full Family Communication: Spouse at bedside 6/25, daughter Dr. Cherene Goodell via phone 06/22/24, 6/26 Disposition Plan: Status is: Inpatient Remains inpatient appropriate because: Heart failure on IV diuretic   Level of care: Telemetry Cardiac  Consultants:  Cardiology-heart failure  Procedures:  Left right heart catheterization  Antimicrobials: None   Subjective: Seen and examined.  Sitting in bed, no distress.  Frustrated about continued hospital stay.  Objective: Vitals:   06/23/24 0500 06/23/24 0824 06/23/24 1209 06/23/24 1543  BP:  117/85 117/85 103/77  Pulse:  (!) 104 (!) 104 (!) 103  Resp:  18 16 18   Temp:  97.6 F (36.4 C) 97.6 F (36.4 C) 97.6 F (36.4 C)  TempSrc:      SpO2:  97% 98% 96%  Weight: 77.1 kg     Height:  Intake/Output Summary (Last 24 hours) at 06/23/2024 1709 Last data filed at 06/23/2024 1500 Gross per 24 hour  Intake 0 ml  Output 900 ml  Net -900 ml   Filed Weights   06/21/24 0958 06/22/24 0500 06/23/24 0500  Weight: 81.1 kg 78.4 kg 77.1 kg    Examination:  General exam: No acute distress Respiratory system: Clear lungs, normal WOB, RA Cardiovascular system: 1 S2, distant heart sounds, no appreciable murmurs, no pedal  edema Gastrointestinal system: Soft, NT/ND, normal bowel sounds Central nervous system: Alert and oriented. No focal neurological deficits. Extremities: Decreased power bilateral lower extremities.  Gait not assessed Skin: No rashes, lesions or ulcers Psychiatry: Judgement and insight appear normal. Mood & affect appropriate.     Data Reviewed: I have personally reviewed following labs and imaging studies  CBC: Recent Labs  Lab 06/19/24 0823 06/20/24 0234 06/21/24 0615 06/21/24 1130 06/21/24 1134 06/22/24 0440 06/23/24 1040  WBC 5.8 5.5 4.6  --   --  5.0 5.3  NEUTROABS  --   --   --   --   --   --  3.4  HGB 11.2* 11.3* 10.9* 11.9* 11.6* 10.8* 11.5*  HCT 32.0* 31.8* 31.1* 35.0* 34.0* 31.0* 33.5*  MCV 100.3* 98.8 101.0*  --   --  99.4 100.0  PLT 162 178 171  --   --  182 170   Basic Metabolic Panel: Recent Labs  Lab 06/19/24 0823 06/20/24 0234 06/21/24 0615 06/21/24 1130 06/21/24 1134 06/22/24 0440 06/23/24 1040  NA 134* 133* 135 135 135 135 130*  K 3.2* 4.0 3.8 3.7 3.7 4.3 4.0  CL 101 99 98  --   --  97* 92*  CO2 25 26 28   --   --  27 29  GLUCOSE 108* 107* 105*  --   --  99 121*  BUN 21 18 14   --   --  15 16  CREATININE 1.07 0.95 0.97  --   --  0.89 0.99  CALCIUM  8.4* 8.7* 8.5*  --   --  8.7* 8.9  MG 1.6* 1.7  --   --   --  1.5* 2.1   GFR: Estimated Creatinine Clearance: 65.5 mL/min (by C-G formula based on SCr of 0.99 mg/dL). Liver Function Tests: Recent Labs  Lab 06/17/24 1421 06/19/24 0823 06/22/24 0440  AST 30 19 30   ALT 18 14 22   ALKPHOS 58 49 55  BILITOT 1.3* 1.0 0.8  PROT 5.8* 5.2* 5.7*  ALBUMIN 3.7 3.4* 3.5   No results for input(s): LIPASE, AMYLASE in the last 168 hours. No results for input(s): AMMONIA in the last 168 hours. Coagulation Profile: Recent Labs  Lab 06/17/24 1421  INR 1.6*   Cardiac Enzymes: No results for input(s): CKTOTAL, CKMB, CKMBINDEX, TROPONINI in the last 168 hours. BNP (last 3 results) No results  for input(s): PROBNP in the last 8760 hours. HbA1C: No results for input(s): HGBA1C in the last 72 hours. CBG: No results for input(s): GLUCAP in the last 168 hours. Lipid Profile: No results for input(s): CHOL, HDL, LDLCALC, TRIG, CHOLHDL, LDLDIRECT in the last 72 hours. Thyroid  Function Tests: No results for input(s): TSH, T4TOTAL, FREET4, T3FREE, THYROIDAB in the last 72 hours. Anemia Panel: No results for input(s): VITAMINB12, FOLATE, FERRITIN, TIBC, IRON, RETICCTPCT in the last 72 hours. Sepsis Labs: Recent Labs  Lab 06/17/24 1711 06/18/24 0053  LATICACIDVEN 1.8 1.7    No results found for this or any previous visit (from the past 240  hours).       Radiology Studies: MR CARDIAC MORPHOLOGY W WO CONTRAST Result Date: 06/23/2024 CLINICAL DATA:  Cardiomyopathy EXAM: CARDIAC MRI TECHNIQUE: The patient was scanned on a 1.5 Tesla Siemens magnet. A dedicated cardiac coil was used. Functional imaging was done using Fiesta sequences. 2,3, and 4 chamber views were done to assess for RWMA's. Modified Simpson's rule using a short axis stack was used to calculate an ejection fraction on a dedicated work [employer]. The patient received 10 cc of Gadavist. After 10 minutes inversion recovery sequences were used to assess for infiltration and scar tissue. Velocity flow mapping performed in the ascending aorta and main pulmonary artery. CONTRAST:  10 cc  of Gadavist FINDINGS: 1. Normal left ventricular size and thickness, severely reduced LV systolic function (LVEF = 11%). There is global hypokinesis. There is no late gadolinium enhancement in the left ventricular myocardium. LVEDV: 234 ml LVESV: 207 ml SV: 26 ml CO: 2.7 L/min Myocardial mass: 149 g LV native T1 value 1040 ms (normal <1000 ms) LV ECV value 31% (normal <30%) 2. Normal right ventricular size and thickness. Severely reduced RV systolic function. 3.  Moderately dilated left atrial  size, normal right atrial size. 4. normal size of the aortic root, ascending aorta and pulmonary artery. 5. Trivial pulmonic valve regurgitation. No significant valvular abnormalities 6.  Normal pericardium.  No pericardial effusion. IMPRESSION: 1. Normal LV size, severely reduced LV systolic function. LVEF 11%. 2.  No LGE or scar. 3.  No evidence for infiltrative or inflammatory disease. 4.  Severely reduced RV systolic function. 5.  No significant valvular abnormalities. Electronically Signed   By: Redell Cave M.D.   On: 06/23/2024 14:05   MR CARDIAC VELOCITY FLOW MAP Result Date: 06/23/2024 CLINICAL DATA:  Cardiomyopathy EXAM: CARDIAC MRI TECHNIQUE: The patient was scanned on a 1.5 Tesla Siemens magnet. A dedicated cardiac coil was used. Functional imaging was done using Fiesta sequences. 2,3, and 4 chamber views were done to assess for RWMA's. Modified Simpson's rule using a short axis stack was used to calculate an ejection fraction on a dedicated work [employer] officer, [employer]. The patient received 10 cc of Gadavist. After 10 minutes inversion recovery sequences were used to assess for infiltration and scar tissue. Velocity flow mapping performed in the ascending aorta and main pulmonary artery. CONTRAST:  10 cc  of Gadavist FINDINGS: 1. Normal left ventricular size and thickness, severely reduced LV systolic function (LVEF = 11%). There is global hypokinesis. There is no late gadolinium enhancement in the left ventricular myocardium. LVEDV: 234 ml LVESV: 207 ml SV: 26 ml CO: 2.7 L/min Myocardial mass: 149 g LV native T1 value 1040 ms (normal <1000 ms) LV ECV value 31% (normal <30%) 2. Normal right ventricular size and thickness. Severely reduced RV systolic function. 3.  Moderately dilated left atrial size, normal right atrial size. 4. normal size of the aortic root, ascending aorta and pulmonary artery. 5. Trivial pulmonic valve regurgitation. No significant valvular abnormalities 6.  Normal  pericardium.  No pericardial effusion. IMPRESSION: 1. Normal LV size, severely reduced LV systolic function. LVEF 11%. 2.  No LGE or scar. 3.  No evidence for infiltrative or inflammatory disease. 4.  Severely reduced RV systolic function. 5.  No significant valvular abnormalities. Electronically Signed   By: Redell Cave M.D.   On: 06/23/2024 14:05   MR CARDIAC VELOCITY FLOW MAP Result Date: 06/23/2024 CLINICAL DATA:  Cardiomyopathy EXAM: CARDIAC MRI TECHNIQUE: The patient was scanned  on a 1.5 Tesla Siemens magnet. A dedicated cardiac coil was used. Functional imaging was done using Fiesta sequences. 2,3, and 4 chamber views were done to assess for RWMA's. Modified Simpson's rule using a short axis stack was used to calculate an ejection fraction on a dedicated work [employer] officer, [employer]. The patient received 10 cc of Gadavist. After 10 minutes inversion recovery sequences were used to assess for infiltration and scar tissue. Velocity flow mapping performed in the ascending aorta and main pulmonary artery. CONTRAST:  10 cc  of Gadavist FINDINGS: 1. Normal left ventricular size and thickness, severely reduced LV systolic function (LVEF = 11%). There is global hypokinesis. There is no late gadolinium enhancement in the left ventricular myocardium. LVEDV: 234 ml LVESV: 207 ml SV: 26 ml CO: 2.7 L/min Myocardial mass: 149 g LV native T1 value 1040 ms (normal <1000 ms) LV ECV value 31% (normal <30%) 2. Normal right ventricular size and thickness. Severely reduced RV systolic function. 3.  Moderately dilated left atrial size, normal right atrial size. 4. normal size of the aortic root, ascending aorta and pulmonary artery. 5. Trivial pulmonic valve regurgitation. No significant valvular abnormalities 6.  Normal pericardium.  No pericardial effusion. IMPRESSION: 1. Normal LV size, severely reduced LV systolic function. LVEF 11%. 2.  No LGE or scar. 3.  No evidence for infiltrative or inflammatory disease.  4.  Severely reduced RV systolic function. 5.  No significant valvular abnormalities. Electronically Signed   By: Redell Cave M.D.   On: 06/23/2024 14:05        Scheduled Meds:  apixaban   5 mg Oral BID   atorvastatin   40 mg Oral Daily   Chlorhexidine  Gluconate Cloth  6 each Topical Daily   furosemide   60 mg Oral Daily   levothyroxine   100 mcg Oral Q0600   magnesium  oxide  400 mg Oral BID   metoprolol  succinate  25 mg Oral QPM   pantoprazole   40 mg Oral Daily   polyethylene glycol  17 g Oral BID   potassium chloride   20 mEq Oral Daily   sacubitril-valsartan   1 tablet Oral BID   sodium chloride  flush  10-40 mL Intracatheter Q12H   sodium chloride  flush  3 mL Intravenous Q12H   spironolactone  25 mg Oral Daily   tamsulosin   0.4 mg Oral QPC supper   venlafaxine  XR  75 mg Oral Q breakfast   Continuous Infusions:   LOS: 6 days      Calvin KATHEE Robson, MD [facility]   If 7PM-7AM, please contact night-coverage  06/23/2024, 5:09 PM

## 2024-06-23 NOTE — Progress Notes (Signed)
 Foley removed at 0530. Patient tolerated well. Urinal given to patient.

## 2024-06-23 NOTE — Progress Notes (Signed)
 Physical Therapy Treatment Patient Details Name: Eugene White MRN: [id number] DOB: [birth year] Today's Date: 06/23/2024   History of Present Illness Eugene White is a 77yoM who comes to [facility] on 06/17/24 White SOB, leg swelling. PMH: HTN, PAF on eliquis , hypoTSH. At baseline can ambulate around with mild low back discomfort, not limited by dyspnea. Reports that he quit smoking about 55 years ago.    PT Comments  Pt awake, agreeable to session, demonstrate improved independence, safety, balance, velocity, fluency of movement required for ADL performance in home. Denies dyspnea, HR 115 during AMB, gait speed appropriate for limited community distances. Pt able to void 100cc urine into jug urinal at end of session for first time since foley removal overnight.     If plan is discharge home, recommend the following: A little help with walking and/or transfers;A little help with bathing/dressing/bathroom;Direct supervision/assist for medications management;Assistance with cooking/housework;Help with stairs or ramp for entrance;Assist for transportation   Can travel by private vehicle        Equipment Recommendations  None recommended by PT    Recommendations for Other Services       Precautions / Restrictions Precautions Precautions: Fall Restrictions Weight Bearing Restrictions Per Provider Order: No     Mobility  Bed Mobility Overal bed mobility: Modified Independent Bed Mobility: Supine to Sit     Supine to sit: Modified independent (Device/Increase time) Sit to supine: Modified independent (Device/Increase time)        Transfers Overall transfer level: Needs assistance Equipment used: Rolling walker (2 wheels) Transfers: Sit to/from Stand, Bed to chair/wheelchair/BSC Sit to Stand: From elevated surface, Modified independent (Device/Increase time)          Lateral/Scoot Transfers: Modified independent (Device/Increase time), From elevated surface       Ambulation/Gait Ambulation/Gait assistance: Contact guard assist, Supervision Gait Distance (Feet): 270 Feet Assistive device: Rolling walker (2 wheels) Gait Pattern/deviations: WFL(Within Functional Limits), Step-through pattern Gait velocity: 0.73m/s     General Gait Details: much better pacing, much better self-management of stability; no LOB   Stairs             Wheelchair Mobility     Tilt Bed    Modified Rankin (Stroke Patients Only)       Balance                                            Communication    Cognition                                        Cueing    Exercises      General Comments        Pertinent Vitals/Pain Pain Assessment Pain Assessment: No/denies pain    Home Living                          Prior Function            PT Goals (current goals can now be found in the care plan section) Acute Rehab PT Goals Patient Stated Goal: regain baseline mobility independence PT Goal Formulation: With patient Time For Goal Achievement: 07/06/24 Potential to Achieve Goals: Good Progress towards PT goals: Progressing toward goals    Frequency    Min  2X/week      PT Plan      Co-evaluation              AM-PAC PT 6 Clicks Mobility   Outcome Measure  Help needed turning from your back to your side while in a flat bed without using bedrails?: A Little Help needed moving from lying on your back to sitting on the side of a flat bed without using bedrails?: A Little Help needed moving to and from a bed to a chair (including a wheelchair)?: A Little Help needed standing up from a chair using your arms (e.g., wheelchair or bedside chair)?: A Little Help needed to walk in hospital room?: A Little Help needed climbing 3-5 steps with a railing? : A Little 6 Click Score: 18    End of Session Equipment Utilized During Treatment: Gait belt Activity Tolerance: Patient tolerated  treatment well;No increased pain Patient left: in bed;with call bell/phone within reach Nurse Communication: Mobility status PT Visit Diagnosis: Difficulty in walking, not elsewhere classified (R26.2);Other abnormalities of gait and mobility (R26.89)     Time: 9168-9153 PT Time Calculation (min) (ACUTE ONLY): 15 min  Charges:    $Therapeutic Activity: 8-22 mins PT General Charges $$ ACUTE PT VISIT: 1 Visit                    8:58 AM, 06/23/24 Eugene White, PT, DPT Physical Therapist - [facility]  [phone] (ASCOM)    Eugene White 06/23/2024, 8:56 AM

## 2024-06-24 ENCOUNTER — Telehealth: Payer: Self-pay

## 2024-06-24 ENCOUNTER — Ambulatory Visit: Payer: Medicare (Managed Care) | Admitting: Physician Assistant

## 2024-06-24 DIAGNOSIS — I5021 Acute systolic (congestive) heart failure: Secondary | ICD-10-CM | POA: Diagnosis not present

## 2024-06-24 MED ORDER — ATORVASTATIN CALCIUM 40 MG PO TABS: 40.0000 mg | ORAL_TABLET | Freq: Every day | ORAL | 0 refills | Status: DC

## 2024-06-24 MED ORDER — SACUBITRIL-VALSARTAN 24-26 MG PO TABS: 1.0000 | ORAL_TABLET | Freq: Two times a day (BID) | ORAL | 0 refills | Status: DC

## 2024-06-24 MED ORDER — SPIRONOLACTONE 25 MG PO TABS: 25.0000 mg | ORAL_TABLET | Freq: Every day | ORAL | 0 refills | Status: DC

## 2024-06-24 MED ORDER — FUROSEMIDE 40 MG PO TABS: 60.0000 mg | ORAL_TABLET | Freq: Every day | ORAL | 3 refills | Status: DC

## 2024-06-24 MED ORDER — POTASSIUM CHLORIDE CRYS ER 20 MEQ PO TBCR: 20.0000 meq | EXTENDED_RELEASE_TABLET | Freq: Every day | ORAL | 3 refills | Status: AC

## 2024-06-24 MED ORDER — METOPROLOL SUCCINATE ER 25 MG PO TB24: 25.0000 mg | ORAL_TABLET | Freq: Every evening | ORAL | 0 refills | Status: DC

## 2024-06-24 MED ORDER — TAMSULOSIN HCL 0.4 MG PO CAPS: 0.4000 mg | ORAL_CAPSULE | Freq: Every day | ORAL | 1 refills | Status: DC

## 2024-06-24 MED ORDER — ZOLPIDEM TARTRATE 5 MG PO TABS: 5.0000 mg | ORAL_TABLET | Freq: Every evening | ORAL | 0 refills | Status: AC | PRN

## 2024-06-24 NOTE — OR Nursing (Signed)
 1600: Patient d/cd via wheelchair in stable condition. No distress.

## 2024-06-24 NOTE — TOC Progression Note (Signed)
 Transition of Care [facility]) - Progression Note    Patient Details  Name: Eugene White MRN: [id number] Date of Birth: 06-11-1946  Transition of [facility] CM/SW Contact  [name], RN Phone Number: 06/24/2024, 10:15 AM  Clinical Narrative:    Spoke with patient regarding HH rec. Patient is agreeable to [facility]. He does not have a preference of HH. He has been advised the accepting [facility] agency will contact him to schedule his SOC within 48 hours.   Per [place]  from [facility], plan is not in network  Per [facility] plan is not in network.  Referral sent to and accepted by [place] at [facility].   TOC signing off.          Expected Discharge Plan and Services         Expected Discharge Date: 06/24/24                                     Social Determinants of Health (SDOH) Interventions SDOH Screenings   Food Insecurity: No Food Insecurity (06/18/2024)  Housing: Low Risk  (06/18/2024)  Transportation Needs: No Transportation Needs (06/18/2024)  Utilities: Not At Risk (06/18/2024)  Alcohol Screen: Low Risk  (07/13/2023)  Depression (PHQ2-9): High Risk (06/10/2024)  Financial Resource Strain: Low Risk  (10/08/2018)  Physical Activity: Inactive (07/13/2023)  Social Connections: Socially Isolated (06/18/2024)  Stress: No Stress Concern Present (07/13/2023)  Tobacco Use: Medium Risk (06/17/2024)  Health Literacy: Adequate Health Literacy (07/13/2023)    Readmission Risk Interventions     No data to display

## 2024-06-24 NOTE — Plan of Care (Signed)

## 2024-06-24 NOTE — Telephone Encounter (Signed)
 Advised you were out of the office.  Eugene White that since he has been your patient I thought you probably would but I could not speak for you and would have to get back to her on Monday after you had a chance to review    Copied from CRM #[id number]. Topic: Referral - Question >> Jun 24, 2024 11:03 AM Charlet HERO wrote: Reason for CRM: [facility] [phone] she is calling to see if Dr. KATHEE will be willing to be the provider for the patient who is going to be released from hospital and will need OT and PT orders for after care.

## 2024-06-24 NOTE — OR Nursing (Signed)
 Discharge instructions reviewed with patient and patient's wife. All upcoming scheduled appointments reviewed and confirmed. Appointments that still need scheduling also reviewed with patient and patient's wife. Instructed to call on Monday, 06/27/24 to get appointments scheduled. Understanding verbalized. Patient requesting a Rx. for prn Ambien . MD sent in Rx. Electronically to pharmacy. Informed [facility] will also be contacting them to arrange PT/OT home services. Patient refused to go home with a new Foley in place, but did agree to f/u as an outpatient next week with Urology. Patient states he is asymptomatic for bladder retention symptoms at present.

## 2024-06-24 NOTE — Discharge Summary (Signed)
 Physician Discharge Summary  Eugene White FMW:[id number] DOB: [birth year] DOA: 06/17/2024  PCP: Myrla Jon HERO, MD  Admit date: 06/17/2024 Discharge date: 06/24/2024  Admitted From: Home Disposition:  Home w home health  Recommendations for Outpatient Follow-up:  Follow up with PCP in 1-2 weeks Follow-up outpatient heart failure 1 to 2 weeks Follow-up outpatient urology 1 week  Home Health: Yes PT OT Equipment/Devices: None  Discharge Condition: Stable CODE STATUS: Full Diet recommendation: Heart healthy  Brief/Interim Summary:  78 y.o. male with a PMH significant for HTN, obesity, pAF on Eliquis , hypothyroidism who presented with SOB, swelling, fatigue.  Patient lost about 50 lbs over the last year, but in the last month, has gained 20 lbs, and during the same time, has noticed swelling in his abdomen and legs.  Finally, he went to PCP's office, we here he was noted to have BNP >4000, and started on Lasix .  He trialed this at home and had marginal improvement, but was still too SOB to walk across the room so he came to the ER.  Started on IV Lasix  and [facility] called to admit.      Discharge Diagnoses:  Principal Problem:   Acute HFrEF (heart failure with reduced ejection fraction) (HCC) Active Problems:   Paroxysmal atrial fibrillation (HCC)   Generalized abdominal pain   Hypothyroidism   Essential (primary) hypertension   Acute CHF (congestive heart failure) (HCC)   Obesity (BMI 30-39.9)   Hyponatremia   Ascites   Peripheral edema   Alcohol use   Shortness of breath   Dilated cardiomyopathy (HCC)  Acute systolic CHF (congestive heart failure) (HCC) New diagnosis.  Echocardiogram 20-25%.  Etiology is uncertain.  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  New left bundle branch block.  Cardiac MRI unrevealing.  No evidence of infiltrative CMO seen on imaging Plan: Lasix  60mg  PO every day  Aldactone  25 mg daily Entresto  24/26 mg twice  daily Toprol  25 mg daily Follow-up outpatient heart failure service   Cardiomyopathy: See above   Paroxysmal atrial fibrillation (HCC) In NSR at time of discharge.  On Eliquis    BPH: Catheter placed in-house by urology on 6/21.  Urine output improved.  Flomax  to continue.  Patient did have some postvoid residual noted on bladder scan on day of discharge.  Discussed case with urology.  Previous CT reviewed that demonstrates some element of bladder distention chronically.  Suspect chronic incomplete emptying.  Patient asymptomatic at time of discharge.  Discussed possibility of Foley catheterization before DC.  Patient adamantly refuses and would prefer to return home.  Outpatient follow-up with urology for postvoid residual will be set up within the next 1 week.   Discharge Instructions  Discharge Instructions     Diet - low sodium heart healthy   Complete by: As directed    Increase activity slowly   Complete by: As directed       Allergies as of 06/24/2024       Reactions   Sulfa Antibiotics Other (See Comments)   Unknown/childhood allergy unknown        Medication List     STOP taking these medications    amoxicillin 500 MG capsule Commonly known as: AMOXIL   losartan  50 MG tablet Commonly known as: COZAAR    metoprolol  tartrate 25 MG tablet Commonly known as: LOPRESSOR        TAKE these medications    acetaminophen  500 MG tablet Commonly known as: TYLENOL  Take 1,500 mg by mouth every 6 (six) hours  as needed for moderate pain (pain score 4-6).   apixaban  5 MG Tabs tablet Commonly known as: Eliquis  Take 1 tablet (5 mg total) by mouth 2 (two) times daily.   atorvastatin  40 MG tablet Commonly known as: LIPITOR Take 1 tablet (40 mg total) by mouth daily. What changed:  medication strength how much to take   furosemide  40 MG tablet Commonly known as: LASIX  Take 1.5 tablets (60 mg total) by mouth daily. What changed: how much to take   levothyroxine   100 MCG tablet Commonly known as: SYNTHROID  Take 1 tablet (100 mcg total) by mouth daily.   magnesium  oxide 400 (240 Mg) MG tablet Commonly known as: MAG-OX TAKE 1 TABLET TWICE DAILY .   metoprolol  succinate 25 MG 24 hr tablet Commonly known as: TOPROL -XL Take 1 tablet (25 mg total) by mouth every evening.   multivitamin with minerals Tabs tablet Take 1 tablet by mouth daily.   pantoprazole  40 MG tablet Commonly known as: PROTONIX  Take 1 tablet (40 mg total) by mouth daily.   potassium chloride  SA 20 MEQ tablet Commonly known as: Klor-Con  M20 Take 1 tablet (20 mEq total) by mouth daily. What changed: how much to take   PreserVision AREDS 2 Caps Take 1 capsule by mouth 2 (two) times daily.   sacubitril -valsartan  24-26 MG Commonly known as: ENTRESTO  Take 1 tablet by mouth 2 (two) times daily.   spironolactone  25 MG tablet Commonly known as: ALDACTONE  Take 1 tablet (25 mg total) by mouth daily.   tamsulosin  0.4 MG Caps capsule Commonly known as: FLOMAX  Take 1 capsule (0.4 mg total) by mouth daily after supper.   venlafaxine  XR 75 MG 24 hr capsule Commonly known as: EFFEXOR -XR TAKE 1 CAPSULE EVERY DAY   Vitamin D  50 MCG (2000 UT) Caps Take 2,000 Units by mouth every evening.        Follow-up Information     Bacigalupo, Jon HERO, MD. Schedule an appointment as soon as possible for a visit in 1 week(s).   Specialty: Family Medicine Contact information: [address] [place] [address] [phone]         Santo Stanly LABOR, MD Follow up.   Specialty: Cardiology Contact information: [address] [place] [address] [phone]         [facility] Follow up on 06/30/2024.   Specialty: Cardiology Why: Follow up in the [facility] 06/30/24 at 3:30 pm Please bring all medications with you to the appointment Contact information: [address] [place]  [address] [phone]               Allergies  Allergen Reactions   Sulfa Antibiotics Other (See Comments)    Unknown/childhood allergy unknown    Consultations: Cardiology   Procedures/Studies: MR CARDIAC MORPHOLOGY W WO CONTRAST Result Date: 06/23/2024 CLINICAL DATA:  Cardiomyopathy EXAM: CARDIAC MRI TECHNIQUE: The patient was scanned on a 1.5 Tesla Siemens magnet. A dedicated cardiac coil was used. Functional imaging was done using Fiesta sequences. 2,3, and 4 chamber views were done to assess for RWMA's. Modified Simpson's rule using a short axis stack was used to calculate an ejection fraction on a dedicated work [employer]. The patient received 10 cc of Gadavist . After 10 minutes inversion recovery sequences were used to assess for infiltration and scar tissue. Velocity flow mapping performed in the ascending aorta and main pulmonary artery. CONTRAST:  10 cc  of Gadavist   FINDINGS: 1. Normal left ventricular size and thickness, severely reduced LV systolic function (LVEF = 11%). There is global hypokinesis. There is no late gadolinium enhancement in the left ventricular myocardium. LVEDV: 234 ml LVESV: 207 ml SV: 26 ml CO: 2.7 L/min Myocardial mass: 149 g LV native T1 value 1040 ms (normal <1000 ms) LV ECV value 31% (normal <30%) 2. Normal right ventricular size and thickness. Severely reduced RV systolic function. 3.  Moderately dilated left atrial size, normal right atrial size. 4. normal size of the aortic root, ascending aorta and pulmonary artery. 5. Trivial pulmonic valve regurgitation. No significant valvular abnormalities 6.  Normal pericardium.  No pericardial effusion. IMPRESSION: 1. Normal LV size, severely reduced LV systolic function. LVEF 11%. 2.  No LGE or scar. 3.  No evidence for infiltrative or inflammatory disease. 4.  Severely reduced RV systolic function. 5.  No significant valvular abnormalities. Electronically Signed    By: Redell Cave M.D.   On: 06/23/2024 14:05   MR CARDIAC VELOCITY FLOW MAP Result Date: 06/23/2024 CLINICAL DATA:  Cardiomyopathy EXAM: CARDIAC MRI TECHNIQUE: The patient was scanned on a 1.5 Tesla Siemens magnet. A dedicated cardiac coil was used. Functional imaging was done using Fiesta sequences. 2,3, and 4 chamber views were done to assess for RWMA's. Modified Simpson's rule using a short axis stack was used to calculate an ejection fraction on a dedicated work [employer]. The patient received 10 cc of Gadavist . After 10 minutes inversion recovery sequences were used to assess for infiltration and scar tissue. Velocity flow mapping performed in the ascending aorta and main pulmonary artery. CONTRAST:  10 cc  of Gadavist  FINDINGS: 1. Normal left ventricular size and thickness, severely reduced LV systolic function (LVEF = 11%). There is global hypokinesis. There is no late gadolinium enhancement in the left ventricular myocardium. LVEDV: 234 ml LVESV: 207 ml SV: 26 ml CO: 2.7 L/min Myocardial mass: 149 g LV native T1 value 1040 ms (normal <1000 ms) LV ECV value 31% (normal <30%) 2. Normal right ventricular size and thickness. Severely reduced RV systolic function. 3.  Moderately dilated left atrial size, normal right atrial size. 4. normal size of the aortic root, ascending aorta and pulmonary artery. 5. Trivial pulmonic valve regurgitation. No significant valvular abnormalities 6.  Normal pericardium.  No pericardial effusion. IMPRESSION: 1. Normal LV size, severely reduced LV systolic function. LVEF 11%. 2.  No LGE or scar. 3.  No evidence for infiltrative or inflammatory disease. 4.  Severely reduced RV systolic function. 5.  No significant valvular abnormalities. Electronically Signed   By: Redell Cave M.D.   On: 06/23/2024 14:05   MR CARDIAC VELOCITY FLOW MAP Result Date: 06/23/2024 CLINICAL DATA:  Cardiomyopathy EXAM: CARDIAC MRI TECHNIQUE: The patient was scanned on  a 1.5 Tesla Siemens magnet. A dedicated cardiac coil was used. Functional imaging was done using Fiesta sequences. 2,3, and 4 chamber views were done to assess for RWMA's. Modified Simpson's rule using a short axis stack was used to calculate an ejection fraction on a dedicated work [employer]. The patient received 10 cc of Gadavist . After 10 minutes inversion recovery sequences were used to assess for infiltration and scar tissue. Velocity flow mapping performed in the ascending aorta and main pulmonary artery. CONTRAST:  10 cc  of Gadavist  FINDINGS: 1. Normal left ventricular size and thickness, severely reduced LV systolic function (LVEF = 11%). There is global hypokinesis. There is no late gadolinium enhancement in the left ventricular  myocardium. LVEDV: 234 ml LVESV: 207 ml SV: 26 ml CO: 2.7 L/min Myocardial mass: 149 g LV native T1 value 1040 ms (normal <1000 ms) LV ECV value 31% (normal <30%) 2. Normal right ventricular size and thickness. Severely reduced RV systolic function. 3.  Moderately dilated left atrial size, normal right atrial size. 4. normal size of the aortic root, ascending aorta and pulmonary artery. 5. Trivial pulmonic valve regurgitation. No significant valvular abnormalities 6.  Normal pericardium.  No pericardial effusion. IMPRESSION: 1. Normal LV size, severely reduced LV systolic function. LVEF 11%. 2.  No LGE or scar. 3.  No evidence for infiltrative or inflammatory disease. 4.  Severely reduced RV systolic function. 5.  No significant valvular abnormalities. Electronically Signed   By: Redell Cave M.D.   On: 06/23/2024 14:05   CARDIAC CATHETERIZATION Result Date: 06/21/2024 Conclusions: Severe single-vessel coronary artery disease with diffusely diseased LAD with focal stenoses in the mid and distal vessel of up to 70%.  Ramus intermedius, LCx, and RCA demonstrate mild-moderate, nonobstructive coronary artery disease.  Degree of LVEF reduction is out of  proportion to CAD burden suggestive of predominantly nonischemic cardiomyopathy. Mildly elevated left heart filling pressures (PCWP 20 mmHg). Normal to mildly elevated right heart filling pressures (mean RA 5 mmHg, RVEDP 7 mmHg). Mild to moderate pulmonary hypertension (PAP 43/22, mean 29 mmHg). Normal Fick cardiac output/index (CO 5.8 L/min, CI 2.9 L/min/m). Recommendations: Continue gentle diuresis and optimization of evidence-based heart failure therapy. Favor medical therapy of diffuse LAD disease.  If the patient were to develop angina, PCI to the LAD could be considered though disease is fairly diffuse and vessel is relatively small. Aggressive secondary prevention of coronary artery disease. Resume heparin  infusion 2 hours after TR band removal.  Transition to apixaban  as soon as tomorrow if there are no other invasive procedures planned and no evidence of bleeding/vascular injury at catheterization sites. Lonni Hanson, MD [facility]  ECHOCARDIOGRAM COMPLETE Result Date: 06/19/2024    ECHOCARDIOGRAM REPORT   Patient Name:   Eugene White Date of Exam: 06/18/2024 Medical Rec #:  [id number]        Height:       71.0 in Accession #:    [id number]       Weight:       189.6 lb Date of Birth:  Mar 16, 1946        BSA:          2.061 m Patient Age:    85 years         BP:           117/91 mmHg Patient Gender: M                HR:           76 bpm. Exam Location:  [facility] Procedure: 2D Echo, Cardiac Doppler, Color Doppler and Strain Analysis (Both            Spectral and Color Flow Doppler were utilized during procedure). Indications:     CHF-Acute Diastolic I50.31  History:         Patient has prior history of Echocardiogram examinations. MR,                  TR                  1st Degree AV Block.  Sonographer:     Bari Roar Referring Phys:  [id number] CHRISTOPHER P DANFORD Diagnosing Phys: Timothy Gollan MD  Sonographer Comments: Global longitudinal strain was attempted. IMPRESSIONS  1. Left ventricular  ejection fraction, by estimation, is 20 to 25%. Left ventricular ejection fraction by 2D MOD biplane is 21.7 %. The left ventricle has severely decreased function. The left ventricle demonstrates global hypokinesis. There is mild left ventricular hypertrophy. Left ventricular diastolic parameters are indeterminate. The average left ventricular global longitudinal strain is -6.8 %.  2. Right ventricular systolic function is moderately reduced. The right ventricular size is mildly enlarged. There is moderately elevated pulmonary artery systolic pressure. The estimated right ventricular systolic pressure is 53.6 mmHg.  3. Left atrial size was moderately dilated.  4. Right atrial size was moderately dilated.  5. Moderate pleural effusion in the left lateral region.  6. The mitral valve is normal in structure. Moderate to severe mitral valve regurgitation. No evidence of mitral stenosis.  7. The aortic valve is calcified. There is moderate calcification of the aortic valve. Aortic valve regurgitation is moderate. Aortic valve sclerosis/calcification is present, without any evidence of aortic stenosis. Aortic valve mean gradient measures 3.0 mmHg.  8. Pulmonic valve regurgitation is moderate.  9. The inferior vena cava is normal in size with greater than 50% respiratory variability, suggesting right atrial pressure of 3 mmHg. FINDINGS  Left Ventricle: Left ventricular ejection fraction, by estimation, is 20 to 25%. Left ventricular ejection fraction by 2D MOD biplane is 21.7 %. The left ventricle has severely decreased function. The left ventricle demonstrates global hypokinesis. The average left ventricular global longitudinal strain is -6.8 %. Strain was performed and the global longitudinal strain is indeterminate. The left ventricular internal cavity size was normal in size. There is mild left ventricular hypertrophy. Left ventricular diastolic parameters are indeterminate. Right Ventricle: The right ventricular size  is mildly enlarged. No increase in right ventricular wall thickness. Right ventricular systolic function is moderately reduced. There is moderately elevated pulmonary artery systolic pressure. The tricuspid regurgitant velocity is 3.30 m/s, and with an assumed right atrial pressure of 10 mmHg, the estimated right ventricular systolic pressure is 53.6 mmHg. Left Atrium: Left atrial size was moderately dilated. Right Atrium: Right atrial size was moderately dilated. Pericardium: There is no evidence of pericardial effusion. Mitral Valve: The mitral valve is normal in structure. Moderate to severe mitral valve regurgitation. No evidence of mitral valve stenosis. MV peak gradient, 3.6 mmHg. The mean mitral valve gradient is 2.0 mmHg. Tricuspid Valve: The tricuspid valve is normal in structure. Tricuspid valve regurgitation is not demonstrated. No evidence of tricuspid stenosis. Aortic Valve: The aortic valve is calcified. There is moderate calcification of the aortic valve. Aortic valve regurgitation is moderate. Aortic regurgitation PHT measures 386 msec. Aortic valve sclerosis/calcification is present, without any evidence of  aortic stenosis. Aortic valve mean gradient measures 3.0 mmHg. Aortic valve peak gradient measures 5.7 mmHg. Aortic valve area, by VTI measures 1.89 cm. Pulmonic Valve: The pulmonic valve was normal in structure. Pulmonic valve regurgitation is moderate. No evidence of pulmonic stenosis. Aorta: The aortic root is normal in size and structure. Venous: The inferior vena cava is normal in size with greater than 50% respiratory variability, suggesting right atrial pressure of 3 mmHg. IAS/Shunts: No atrial level shunt detected by color flow Doppler. Additional Comments: 3D was performed not requiring image post processing on an independent workstation and was indeterminate. There is a moderate pleural effusion in the left lateral region.  LEFT VENTRICLE PLAX 2D  Biplane EF  (MOD) LVIDd:         5.20 cm         LV Biplane EF:   Left LVIDs:         4.70 cm                          ventricular LV PW:         1.20 cm                          ejection LV IVS:        1.30 cm                          fraction by LVOT diam:     2.00 cm                          2D MOD LV SV:         30                               biplane is LV SV Index:   15                               21.7 %. LVOT Area:     3.14 cm                                Diastology                                LV e' medial:    4.26 cm/s LV Volumes (MOD)               LV E/e' medial:  16.6 LV vol d, MOD    190.0 ml      LV e' lateral:   12.40 cm/s A2C:                           LV E/e' lateral: 5.7 LV vol d, MOD    137.0 ml A4C:                           2D Longitudinal LV vol s, MOD    144.0 ml      Strain A2C:                           2D Strain GLS   -6.8 % LV vol s, MOD    103.0 ml      Avg: A4C: LV SV MOD A2C:   46.0 ml LV SV MOD A4C:   137.0 ml LV SV MOD BP:    37.6 ml RIGHT VENTRICLE RV Basal diam:  5.10 cm RV Mid diam:    4.10 cm RV S prime:     7.07 cm/s TAPSE (M-mode): 1.2 cm LEFT ATRIUM              Index        RIGHT ATRIUM  Index LA diam:        5.60 cm  2.72 cm/m   RA Area:     30.90 cm LA Vol (A2C):   140.0 ml 67.92 ml/m  RA Volume:   131.00 ml 63.55 ml/m LA Vol (A4C):   141.0 ml 68.41 ml/m LA Biplane Vol: 142.0 ml 68.89 ml/m  AORTIC VALVE                    PULMONIC VALVE AV Area (Vmax):    1.98 cm     PV Vmax:          0.90 m/s AV Area (Vmean):   1.95 cm     PV Peak grad:     3.2 mmHg AV Area (VTI):     1.89 cm     PR End Diast Vel: 19.89 msec AV Vmax:           119.00 cm/s  RVOT Peak grad:   1 mmHg AV Vmean:          72.900 cm/s AV VTI:            0.160 m AV Peak Grad:      5.7 mmHg AV Mean Grad:      3.0 mmHg LVOT Vmax:         75.00 cm/s LVOT Vmean:        45.300 cm/s LVOT VTI:          0.096 m LVOT/AV VTI ratio: 0.60 AI PHT:            386 msec  AORTA Ao Root diam: 2.70 cm Ao Asc diam:  3.20  cm MITRAL VALVE               TRICUSPID VALVE MV Area (PHT): 3.12 cm    TR Peak grad:   43.6 mmHg MV Area VTI:   2.94 cm    TR Vmax:        330.00 cm/s MV Peak grad:  3.6 mmHg MV Mean grad:  2.0 mmHg    SHUNTS MV Vmax:       0.95 m/s    Systemic VTI:  0.10 m MV Vmean:      61.8 cm/s   Systemic Diam: 2.00 cm MV Decel Time: 243 msec MV E velocity: 70.70 cm/s Evalene Lunger MD Electronically signed by Evalene Lunger MD Signature Date/Time: 06/19/2024/8:15:28 AM    Final    US  Abdomen Complete Result Date: 06/18/2024 CLINICAL DATA:  Ascites. EXAM: ABDOMEN ULTRASOUND COMPLETE COMPARISON:  CT with contrast 07/03/2022. FINDINGS: Gallbladder: Gallbladder is distended. There is a small lobular area dependently measured by the sonographer 4 mm which is not shadow. This could be mass effect from adjacent structures versus a small polyp. No clear shadowing stones, wall thickening. No reported Murphy's sign. Common bile duct: Diameter: 7 mm. Liver: Multiple anechoic foci identified in the liver consistent with numerous cysts. Example left hepatic lobe measures up to 3.5 cm. Smaller foci measure 14 mm. There is larger right-sided focus peripherally measuring 4.9 cm. Portal vein is patent on color Doppler imaging with normal direction of blood flow towards the liver. IVC: No abnormality visualized. Pancreas: Obscured by overlapping bowel gas and soft tissue. Spleen: Size and appearance within normal limits. Poorly seen with overlapping bowel gas and soft tissue. Right Kidney: Length: 11 cm.  Severe collecting system dilatation. Left Kidney: Length: 10.4 cm. Severe collecting system dilatation. This was seen on the CT scan of 07/03/2022. Abdominal aorta:  No aneurysm visualized. Other findings: Bladder has a lobular contour which are brachial a shins and is distended. Bladder volume calculated at 625 cc. Postvoid bladder residual of 471 cc. IMPRESSION: Bilateral severe collecting system dilatation. Left-sided dilatation was seen  on the remote CT scan. The right however is new. Please correlate for history otherwise recommend further evaluation such as CT scan. Bladder wall thickening which are back elation. Large postvoid bladder residual. Numerous hepatic cysts. Question small gallbladder polyp.  No biliary ductal dilatation. Limited visualization of several structures with overlapping bowel gas and soft tissue. Electronically Signed   By: Ranell Bring M.D.   On: 06/18/2024 10:38   DG Chest 2 View Result Date: 06/17/2024 CLINICAL DATA:  Shortness of breath EXAM: CHEST - 2 VIEW COMPARISON:  06/10/2024 FINDINGS: Mild cardiomegaly with trace pleural effusions. Slight central congestion. No consolidation. Aortic atherosclerosis. No pneumothorax. Multiple mild wedging deformities of mid to lower thoracic vertebra. IMPRESSION: Mild cardiomegaly with trace pleural effusions and slight central congestion. Electronically Signed   By: Luke Bun M.D.   On: 06/17/2024 17:30   DG Chest 2 View Result Date: 06/10/2024 CLINICAL DATA:  cough, malaise, concern for pulmonary edema in setting of SOB EXAM: CHEST - 2 VIEW COMPARISON:  June 18, 2022 FINDINGS: No focal airspace consolidation, pleural effusion, or pneumothorax. No cardiomegaly. Tortuous aorta with aortic atherosclerosis. No acute fracture or destructive lesion. Multilevel thoracic osteophytosis. Mild anterior wedging of a couple of midthoracic vertebral bodies, likely chronic. IMPRESSION: No acute cardiopulmonary abnormality. Electronically Signed   By: Rogelia Myers M.D.   On: 06/10/2024 16:34      Subjective: Seen and examined on the day of discharge.  Stable no distress.  Appropriate discharge home.  Discharge Exam: Vitals:   06/24/24 0826 06/24/24 1058  BP: 116/83 117/85  Pulse: 100 (!) 101  Resp: 18 16  Temp: 97.7 F (36.5 C) 97.9 F (36.6 C)  SpO2: 99% 99%   Vitals:   06/24/24 0447 06/24/24 0500 06/24/24 0826 06/24/24 1058  BP: 116/72  116/83 117/85  Pulse:  (!) 101  100 (!) 101  Resp: 18  18 16   Temp: 98.7 F (37.1 C)  97.7 F (36.5 C) 97.9 F (36.6 C)  TempSrc:      SpO2: 100%  99% 99%  Weight:  79.3 kg    Height:        General: Pt is alert, awake, not in acute distress Cardiovascular: RRR, S1/S2 +, no rubs, no gallops Respiratory: CTA bilaterally, no wheezing, no rhonchi Abdominal: Soft, NT, ND, bowel sounds + Extremities: no edema, no cyanosis    The results of significant diagnostics from this hospitalization (including imaging, microbiology, ancillary and laboratory) are listed below for reference.     Microbiology: No results found for this or any previous visit (from the past 240 hours).   Labs: BNP (last 3 results) Recent Labs    06/10/24 1521 06/17/24 1421  BNP 4,407.4* >4,500.0*   Basic Metabolic Panel: Recent Labs  Lab 06/19/24 0823 06/20/24 0234 06/21/24 0615 06/21/24 1130 06/21/24 1134 06/22/24 0440 06/23/24 1040  NA 134* 133* 135 135 135 135 130*  K 3.2* 4.0 3.8 3.7 3.7 4.3 4.0  CL 101 99 98  --   --  97* 92*  CO2 25 26 28   --   --  27 29  GLUCOSE 108* 107* 105*  --   --  99 121*  BUN 21 18 14   --   --  15 16  CREATININE 1.07 0.95 0.97  --   --  0.89 0.99  CALCIUM  8.4* 8.7* 8.5*  --   --  8.7* 8.9  MG 1.6* 1.7  --   --   --  1.5* 2.1   Liver Function Tests: Recent Labs  Lab 06/17/24 1421 06/19/24 0823 06/22/24 0440  AST 30 19 30   ALT 18 14 22   ALKPHOS 58 49 55  BILITOT 1.3* 1.0 0.8  PROT 5.8* 5.2* 5.7*  ALBUMIN 3.7 3.4* 3.5   No results for input(s): LIPASE, AMYLASE in the last 168 hours. No results for input(s): AMMONIA in the last 168 hours. CBC: Recent Labs  Lab 06/19/24 0823 06/20/24 0234 06/21/24 0615 06/21/24 1130 06/21/24 1134 06/22/24 0440 06/23/24 1040  WBC 5.8 5.5 4.6  --   --  5.0 5.3  NEUTROABS  --   --   --   --   --   --  3.4  HGB 11.2* 11.3* 10.9* 11.9* 11.6* 10.8* 11.5*  HCT 32.0* 31.8* 31.1* 35.0* 34.0* 31.0* 33.5*  MCV 100.3* 98.8 101.0*  --   --   99.4 100.0  PLT 162 178 171  --   --  182 170   Cardiac Enzymes: No results for input(s): CKTOTAL, CKMB, CKMBINDEX, TROPONINI in the last 168 hours. BNP: Invalid input(s): POCBNP CBG: No results for input(s): GLUCAP in the last 168 hours. D-Dimer No results for input(s): DDIMER in the last 72 hours. Hgb A1c No results for input(s): HGBA1C in the last 72 hours. Lipid Profile No results for input(s): CHOL, HDL, LDLCALC, TRIG, CHOLHDL, LDLDIRECT in the last 72 hours. Thyroid  function studies No results for input(s): TSH, T4TOTAL, T3FREE, THYROIDAB in the last 72 hours.  Invalid input(s): FREET3 Anemia work up No results for input(s): VITAMINB12, FOLATE, FERRITIN, TIBC, IRON, RETICCTPCT in the last 72 hours. Urinalysis    Component Value Date/Time   COLORURINE YELLOW (A) 06/18/2022 1655   APPEARANCEUR Hazy (A) 09/17/2022 1555   LABSPEC 1.018 06/18/2022 1655   PHURINE 6.0 06/18/2022 1655   GLUCOSEU Negative 09/17/2022 1555   HGBUR NEGATIVE 06/18/2022 1655   BILIRUBINUR Negative 09/17/2022 1555   KETONESUR 5 (A) 06/18/2022 1655   PROTEINUR 2+ (A) 09/17/2022 1555   PROTEINUR 100 (A) 06/18/2022 1655   UROBILINOGEN 0.2 06/26/2022 1608   UROBILINOGEN 0.2 01/03/2015 1433   NITRITE Negative 09/17/2022 1555   NITRITE NEGATIVE 06/18/2022 1655   LEUKOCYTESUR Trace (A) 09/17/2022 1555   LEUKOCYTESUR NEGATIVE 06/18/2022 1655   Sepsis Labs Recent Labs  Lab 06/20/24 0234 06/21/24 0615 06/22/24 0440 06/23/24 1040  WBC 5.5 4.6 5.0 5.3   Microbiology No results found for this or any previous visit (from the past 240 hours).   Time coordinating discharge: 45 minutes  SIGNED:   Calvin KATHEE Robson, MD  [facility] 06/24/2024, 11:40 AM Pager   If 7PM-7AM, please contact night-coverage

## 2024-06-27 ENCOUNTER — Ambulatory Visit: Payer: Medicare (Managed Care) | Admitting: Physician Assistant

## 2024-06-27 ENCOUNTER — Encounter: Payer: Self-pay | Admitting: Physician Assistant

## 2024-06-27 VITALS — BP 121/82 | HR 94 | Ht 71.0 in | Wt 162.4 lb

## 2024-06-27 DIAGNOSIS — N401 Enlarged prostate with lower urinary tract symptoms: Secondary | ICD-10-CM

## 2024-06-27 DIAGNOSIS — N1330 Unspecified hydronephrosis: Secondary | ICD-10-CM | POA: Diagnosis not present

## 2024-06-27 DIAGNOSIS — R3914 Feeling of incomplete bladder emptying: Secondary | ICD-10-CM | POA: Diagnosis not present

## 2024-06-27 LAB — BLADDER SCAN AMB NON-IMAGING

## 2024-06-27 NOTE — Telephone Encounter (Signed)
 Yes I will be the provider of record for [facility]

## 2024-06-27 NOTE — Progress Notes (Signed)
 06/27/2024 3:29 PM   [name] 1946-04-14 [id number]  CC: Chief Complaint  Patient presents with   Other   HPI: Eugene White is a 78 y.o. male with PMH chronic left hydronephrosis of unclear etiology with benign hematuria workup in 2023 and BPH with a recent history of urinary retention in the setting of CHF exacerbation and diuresis who presents today for outpatient follow-up.  He is accompanied today by his wife, who contributes to HPI.  He had an equivocal voiding trial on his day of discharge, but remained asymptomatic and declined Foley replacement.  Flomax  was started.  Today he reports he is voiding at baseline without difficulty or dysuria.  He denies gross hematuria since Foley catheter was removed.  He is adamantly opposed to Foley catheter replacement.  He is tolerating Flomax  without orthostasis.  IPSS 7/mostly satisfied. PVR >468mL.   IPSS     Row Name 06/27/24 1500         International Prostate Symptom Score   How often have you had the sensation of not emptying your bladder? Not at All     How often have you had to urinate less than every two hours? About half the time     How often have you found you stopped and started again several times when you urinated? Less than 1 in 5 times     How often have you found it difficult to postpone urination? Not at All     How often have you had a weak urinary stream? Not at All     How often have you had to strain to start urination? Not at All     How many times did you typically get up at night to urinate? 3 Times     Total IPSS Score 7       Quality of Life due to urinary symptoms   If you were to spend the rest of your life with your urinary condition just the way it is now how would you feel about that? Mostly Satisfied         PMH: Past Medical History:  Diagnosis Date   Adult hypothyroidism 05/31/2009   Anemia    hx of one time   Arthritis    OA   Closed intertrochanteric fracture of hip,  left, initial encounter (HCC) 10/10/2016   Colon, diverticulosis 05/31/2009   Difficulty sleeping    Diverticulitis large intestine 05/31/2009   Diverticulosis 2014   Facial nerve injury, 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   First degree AV block    GERD (gastroesophageal reflux disease)    GI symptom    had nausea / vomited once / frequent stools / getting better   Hyperlipidemia    Hypertension    Hypothyroidism    Mild carotid artery disease (HCC)    Mitral regurgitation    Mood changes    PAF (paroxysmal atrial fibrillation) (HCC)    Pneumonia    hx of   Tricuspid regurgitation    Tricuspid regurgitation     Surgical History: Past Surgical History:  Procedure Laterality Date   CARDIOVERSION N/A 01/12/2018   Procedure: CARDIOVERSION;  Surgeon: Maranda Leim DEL, MD;  Location: [facility] ENDOSCOPY;  Service: Cardiovascular;  Laterality: N/A;   CARDIOVERSION N/A 01/26/2019   Procedure: CARDIOVERSION;  Surgeon: Jeffrie Oneil BROCKS, MD;  Location: [facility] ENDOSCOPY;  Service: Cardiovascular;  Laterality: N/A;   CARDIOVERSION N/A 09/30/2019   Procedure: CARDIOVERSION;  Surgeon:  Pietro Redell RAMAN, MD;  Location: [facility] ENDOSCOPY;  Service: Cardiovascular;  Laterality: N/A;   CARDIOVERSION N/A 04/04/2022   Procedure: CARDIOVERSION;  Surgeon: Alvan Ronal BRAVO, MD;  Location: [facility] ENDOSCOPY;  Service: Cardiovascular;  Laterality: N/A;   COLONOSCOPY  2014   CYSTOSCOPY WITH STENT PLACEMENT Left 06/18/2022   Procedure: CYSTOSCOPY WITH STENT PLACEMENT;  Surgeon: Penne Knee, MD;  Location: [facility] ORS;  Service: Urology;  Laterality: Left;   CYSTOSCOPY WITH STENT PLACEMENT Left 07/21/2022   Procedure: CYSTOSCOPY WITH STENT EXCHANGE;  Surgeon: Penne Knee, MD;  Location: [facility] ORS;  Service: Urology;  Laterality: Left;   FEMUR IM NAIL Left 10/11/2016   Procedure: INTRAMEDULLARY (IM) NAIL FEMORAL;  Surgeon: Norleen JINNY Maltos, MD;  Location: [facility] ORS;  Service: Orthopedics;  Laterality: Left;   HAND SURGERY    1973 / 2010 / 2015   right to release tendons   HARDWARE REMOVAL Left 10/05/2017   Procedure: Removal of left femoral nail;  Surgeon: Ernie Cough, MD;  Location: [facility] ORS;  Service: Orthopedics;  Laterality: Left;  90 mins   HEMATOMA EVACUATION Left 01/19/2018   Procedure: Irrigation and debridement, Evacuation of left total knee hematoma;  Surgeon: Ernie Cough, MD;  Location: [facility] ORS;  Service: Orthopedics;  Laterality: Left;   KNEE ARTHROSCOPY  1984 & 2001   twice   NASAL SEPTUM SURGERY     removal Left femoral nail      10/05/17 Dr. ERNIE   RIGHT HEART CATH AND CORONARY ANGIOGRAPHY N/A 06/21/2024   Procedure: RIGHT HEART CATH AND CORONARY ANGIOGRAPHY;  Surgeon: Mady Bruckner, MD;  Location: [facility] INVASIVE CV LAB;  Service: Cardiovascular;  Laterality: N/A;   SHOULDER OPEN ROTATOR CUFF REPAIR  1999 & 2006   Right and left   TEE WITH CARDIOVERSION  01/12/2018   TEE WITHOUT CARDIOVERSION N/A 01/12/2018   Procedure: TRANSESOPHAGEAL ECHOCARDIOGRAM (TEE);  Surgeon: Maranda Leim DEL, MD;  Location: [facility] ENDOSCOPY;  Service: Cardiovascular;  Laterality: N/A;   TONSILLECTOMY     TOTAL KNEE ARTHROPLASTY Right 01/09/2015   Procedure: RIGHT TOTAL KNEE ARTHROPLASTY;  Surgeon: Cough JONETTA Ernie, MD;  Location: [facility] ORS;  Service: Orthopedics;  Laterality: Right;   TOTAL KNEE ARTHROPLASTY Left 11/23/2017   Procedure: LEFT TOTAL KNEE ARTHROPLASTY;  Surgeon: Ernie Cough, MD;  Location: [facility] ORS;  Service: Orthopedics;  Laterality: Left;  90 mins   URETERAL BIOPSY Left 07/21/2022   Procedure: URETERAL BIOPSY;  Surgeon: Penne Knee, MD;  Location: [facility] ORS;  Service: Urology;  Laterality: Left;   URETEROSCOPY Left 07/21/2022   Procedure: URETEROSCOPY;  Surgeon: Penne Knee, MD;  Location: [facility] ORS;  Service: Urology;  Laterality: Left;    Home Medications:  Allergies as of 06/27/2024       Reactions   Sulfa Antibiotics Other (See Comments)   Unknown/childhood allergy unknown        Medication  List        Accurate as of June 27, 2024  3:29 PM. If you have any questions, ask your nurse or doctor.          acetaminophen  500 MG tablet Commonly known as: TYLENOL  Take 1,500 mg by mouth every 6 (six) hours as needed for moderate pain (pain score 4-6).   apixaban  5 MG Tabs tablet Commonly known as: Eliquis  Take 1 tablet (5 mg total) by mouth 2 (two) times daily.   atorvastatin  40 MG tablet Commonly known as: LIPITOR Take 1 tablet (40 mg total) by mouth daily.   furosemide  40 MG  tablet Commonly known as: LASIX  Take 1.5 tablets (60 mg total) by mouth daily.   levothyroxine  100 MCG tablet Commonly known as: SYNTHROID  Take 1 tablet (100 mcg total) by mouth daily.   magnesium  oxide 400 (240 Mg) MG tablet Commonly known as: MAG-OX TAKE 1 TABLET TWICE DAILY .   metoprolol  succinate 25 MG 24 hr tablet Commonly known as: TOPROL -XL Take 1 tablet (25 mg total) by mouth every evening.   multivitamin with minerals Tabs tablet Take 1 tablet by mouth daily.   pantoprazole  40 MG tablet Commonly known as: PROTONIX  Take 1 tablet (40 mg total) by mouth daily.   potassium chloride  SA 20 MEQ tablet Commonly known as: Klor-Con  M20 Take 1 tablet (20 mEq total) by mouth daily.   PreserVision AREDS 2 Caps Take 1 capsule by mouth 2 (two) times daily.   sacubitril -valsartan  24-26 MG Commonly known as: ENTRESTO  Take 1 tablet by mouth 2 (two) times daily.   spironolactone  25 MG tablet Commonly known as: ALDACTONE  Take 1 tablet (25 mg total) by mouth daily.   tamsulosin  0.4 MG Caps capsule Commonly known as: FLOMAX  Take 1 capsule (0.4 mg total) by mouth daily after supper.   venlafaxine  XR 75 MG 24 hr capsule Commonly known as: EFFEXOR -XR TAKE 1 CAPSULE EVERY DAY   Vitamin D  50 MCG (2000 UT) Caps Take 2,000 Units by mouth every evening.   zolpidem  5 MG tablet Commonly known as: AMBIEN  Take 1 tablet (5 mg total) by mouth at bedtime as needed for sleep.         Allergies:  Allergies  Allergen Reactions   Sulfa Antibiotics Other (See Comments)    Unknown/childhood allergy unknown    Family History: Family History  Problem Relation Age of Onset   Breast cancer Mother    Colon cancer Neg Hx    Esophageal cancer Neg Hx    Stomach cancer Neg Hx    Rectal cancer Neg Hx     Social History:   reports that he quit smoking about 55 years ago. His smoking use included cigarettes. He has never used smokeless tobacco. He reports current alcohol use of about 21.0 standard drinks of alcohol per week. He reports that he does not use drugs.  Physical Exam: BP 121/82   Pulse 94   Ht 5' 11 (1.803 m)   Wt 162 lb 6.4 oz (73.7 kg)   BMI 22.65 kg/m   Constitutional:  Alert and oriented, no acute distress, nontoxic appearing HEENT: [place], AT Cardiovascular: No clubbing, cyanosis, or edema Respiratory: Normal respiratory effort, no increased work of breathing GU: Bladder palpable with deep palpation below the umbilicus Skin: No rashes, bruises or suspicious lesions Neurologic: Grossly intact, no focal deficits, moving all 4 extremities Psychiatric: Normal mood and affect  Laboratory Data: Results for orders placed or performed in visit on 06/27/24  BLADDER SCAN AMB NON-IMAGING   Collection Time: 06/27/24  3:32 PM  Result Value Ref Range   Scan Result >420ml    Scan Result >327ml    Assessment & Plan:   1. Benign prostatic hyperplasia with incomplete bladder emptying (Primary) PVR is significantly elevated today, though he continues to void and denies difficulty voiding or dysuria.  I offered him Foley catheter placement, which he declined.  He is already on Flomax , and I suspect an element of chronic incomplete emptying given a rather full bladder on cross-sectional abdominal pelvic imaging dating back to at least 2023.  Using shared decision making, we agreed to check  a BMP today.  If renal function is at baseline, will continue Flomax  and see  him back next month with a renal ultrasound prior to evaluate for hydronephrosis due to urinary retention.  If renal function has worsened since discharge, we will set him up for cystoscopy for evaluation of outlet procedures. - BLADDER SCAN AMB NON-IMAGING - Basic metabolic panel with GFR   Return for Will call with results.  Lucie Hones, PA-C  [facility] [place] [address], [address] [place], [place] [address] [phone]

## 2024-06-28 ENCOUNTER — Ambulatory Visit (INDEPENDENT_AMBULATORY_CARE_PROVIDER_SITE_OTHER): Payer: Medicare (Managed Care) | Admitting: Family Medicine

## 2024-06-28 ENCOUNTER — Encounter: Payer: Self-pay | Admitting: Family Medicine

## 2024-06-28 VITALS — BP 102/78 | HR 108 | Wt 163.2 lb

## 2024-06-28 DIAGNOSIS — I5021 Acute systolic (congestive) heart failure: Secondary | ICD-10-CM

## 2024-06-28 DIAGNOSIS — I10 Essential (primary) hypertension: Secondary | ICD-10-CM

## 2024-06-28 DIAGNOSIS — E039 Hypothyroidism, unspecified: Secondary | ICD-10-CM

## 2024-06-28 DIAGNOSIS — I480 Paroxysmal atrial fibrillation: Secondary | ICD-10-CM

## 2024-06-28 LAB — BASIC METABOLIC PANEL WITH GFR
BUN/Creatinine Ratio: 13 (ref 10–24)
BUN: 16 mg/dL (ref 8–27)
CO2: 21 mmol/L (ref 20–29)
Calcium: 10.2 mg/dL (ref 8.6–10.2)
Chloride: 95 mmol/L — ABNORMAL LOW (ref 96–106)
Creatinine, Ser: 1.23 mg/dL (ref 0.76–1.27)
Glucose: 107 mg/dL — ABNORMAL HIGH (ref 70–99)
Potassium: 4.2 mmol/L (ref 3.5–5.2)
Sodium: 136 mmol/L (ref 134–144)
eGFR: 60 mL/min/{1.73_m2} (ref >59)

## 2024-06-28 NOTE — Progress Notes (Signed)
 Established patient visit   Patient: Eugene White   DOB: [birth year]   78 y.o. Male  MRN: [id number] Visit Date: 06/28/2024  Today's healthcare provider: Jon Eva, MD   Chief Complaint  Patient presents with   Hospitalization Follow-up    Patient was admitted to [facility] 06/17/24 to 06/24/24 due to SOB, swelling, fatigue. He was diagnosed with Acute HFrEF. He was to STOP taking these medications amoxicillin 500 MG capsule, losartan  50 MG tablet and metoprolol  tartrate 25 MG tablet. Start taking atorvastatin  40 MG tablet, 1.5 tablets of furosemide  40 MG tablet daily,  potassium chloride  SA 20 MEQ tablet and all other prescribed medications. Patient reports he is feeling better off but still some room for improvement.    Subjective    HPI HPI     Hospitalization Follow-up    Additional comments: Patient was admitted to [facility] 06/17/24 to 06/24/24 due to SOB, swelling, fatigue. He was diagnosed with Acute HFrEF. He was to STOP taking these medications amoxicillin 500 MG capsule, losartan  50 MG tablet and metoprolol  tartrate 25 MG tablet. Start taking atorvastatin  40 MG tablet, 1.5 tablets of furosemide  40 MG tablet daily,  potassium chloride  SA 20 MEQ tablet and all other prescribed medications. Patient reports he is feeling better off but still some room for improvement.       Last edited by Lilian Fitzpatrick, CMA on 06/28/2024  2:17 PM.       Discussed the use of AI scribe software for clinical note transcription with the patient, who gave verbal consent to proceed.  History of Present Illness   Eugene White is a 78 year old male with heart failure who presents for follow-up after recent hospitalization.  He was hospitalized from June 20 to June 27 for fluid overload, presenting with swelling, dyspnea, and fatigue. He gained 20 pounds in the month prior to admission and lost 40 pounds during hospitalization with Lasix  treatment. Post-hospitalization, he reports improved  breathing and energy levels.  Current medications include Eliquis , atorvastatin , Lasix , Synthroid , magnesium , metoprolol , potassium, Entresto , spironolactone , Flomax , and venlafaxine . He experiences increased urine production due to diuretics, leading to incomplete bladder emptying. Hematuria occurred with Foley catheter placement, likely related to Eliquis . An ultrasound showed a full bladder, and he awaits urology evaluation.  He experiences sleep disturbances, with difficulty falling and staying asleep. Ambien  was prescribed but was not effective. He is scheduled for follow-up with cardiology and urology and awaits home health services for physical and occupational therapy. He monitors his weight daily to manage fluid status.         Medications: Outpatient Medications Prior to Visit  Medication Sig   acetaminophen  (TYLENOL ) 500 MG tablet Take 1,500 mg by mouth every 6 (six) hours as needed for moderate pain (pain score 4-6).   apixaban  (ELIQUIS ) 5 MG TABS tablet Take 1 tablet (5 mg total) by mouth 2 (two) times daily.   atorvastatin  (LIPITOR) 40 MG tablet Take 1 tablet (40 mg total) by mouth daily.   Cholecalciferol  (VITAMIN D ) 2000 UNITS CAPS Take 2,000 Units by mouth every evening.    furosemide  (LASIX ) 40 MG tablet Take 1.5 tablets (60 mg total) by mouth daily.   levothyroxine  (SYNTHROID ) 100 MCG tablet Take 1 tablet (100 mcg total) by mouth daily.   magnesium  oxide (MAG-OX) 400 (240 Mg) MG tablet TAKE 1 TABLET TWICE DAILY .   metoprolol  succinate (TOPROL -XL) 25 MG 24 hr tablet Take 1 tablet (25 mg total) by mouth every  evening.   Multiple Vitamin (MULTIVITAMIN WITH MINERALS) TABS tablet Take 1 tablet by mouth daily.    Multiple Vitamins-Minerals (PRESERVISION AREDS 2) CAPS Take 1 capsule by mouth 2 (two) times daily.    pantoprazole  (PROTONIX ) 40 MG tablet Take 1 tablet (40 mg total) by mouth daily.   potassium chloride  SA (KLOR-CON  M20) 20 MEQ tablet Take 1 tablet (20 mEq total) by  mouth daily.   sacubitril -valsartan  (ENTRESTO ) 24-26 MG Take 1 tablet by mouth 2 (two) times daily.   spironolactone  (ALDACTONE ) 25 MG tablet Take 1 tablet (25 mg total) by mouth daily.   tamsulosin  (FLOMAX ) 0.4 MG CAPS capsule Take 1 capsule (0.4 mg total) by mouth daily after supper.   venlafaxine  XR (EFFEXOR -XR) 75 MG 24 hr capsule TAKE 1 CAPSULE EVERY DAY   zolpidem  (AMBIEN ) 5 MG tablet Take 1 tablet (5 mg total) by mouth at bedtime as needed for sleep.   No facility-administered medications prior to visit.    Review of Systems     Objective    BP 102/78 (BP Location: Left Arm, Patient Position: Sitting, Cuff Size: Normal)   Pulse (!) 108   Wt 163 lb 3.2 oz (74 kg)   SpO2 98%   BMI 22.76 kg/m    Physical Exam   No results found for any visits on 06/28/24.  Assessment & Plan     Problem List Items Addressed This Visit       Cardiovascular and Mediastinum   Essential (primary) hypertension   Paroxysmal atrial fibrillation (HCC)   Acute HFrEF (heart failure with reduced ejection fraction) (HCC) - Primary     Endocrine   Hypothyroidism   Relevant Orders   TSH + free T4        Heart failure with reduced ejection fraction Heart failure with reduced ejection fraction diagnosed during recent hospitalization. Ejection fraction measured at 20-25%, indicating significant reduction. Symptoms included swelling, shortness of breath, and fatigue due to fluid overload. Cardiac catheterization and MRI were inconclusive regarding the etiology of heart failure. Current management focuses on offloading stress on the heart and managing fluid retention. He reports improved breathing and reduced swelling with current treatment regimen. - Continue Entresto  twice daily for heart failure management. - Continue Lasix  60 mg daily for fluid management. - Continue metoprolol  extended release 25 mg daily. - Continue atorvastatin  40 mg daily. - Continue spironolactone  25 mg daily. - Follow-up  with cardiology in [place] for ongoing management.  Chronic kidney disease Chronic kidney disease exacerbated by heart failure and fluid overload. Recent hospitalization involved monitoring kidney function due to increased strain from diuretics and heart failure. Urology is monitoring kidney function and considering further evaluation. - Monitor kidney function through blood work as ordered by urology. - Continue monitoring fluid status and adjust diuretics as needed.  Atrial fibrillation on anticoagulation Atrial fibrillation managed with anticoagulation therapy. Recent hospitalization noted bleeding with Foley catheter placement, likely exacerbated by anticoagulation. He is aware of bleeding risks associated with anticoagulation. - Continue Eliquis  5 mg twice daily for anticoagulation. - Monitor for signs of bleeding, especially if Foley catheter is considered again.  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 Recent ultrasound showed a full bladder. Urology is considering further evaluation and management options, including possible cystoscopy. He declined Foley catheter due to bleeding concerns with anticoagulation. - Continue Flomax  once daily for urinary retention. - Await urology follow-up for potential cystoscopy and further management.  Hypothyroidism on replacement therapy Hypothyroidism managed with Synthroid . Recent thyroid  function tests showed abnormal values, possibly due to sick euthyroid syndrome related to heart failure and fluid overload. Current dose adjustment may not be necessary until further evaluation. Plan to reassess thyroid  status after fluid management stabilizes. - Continue Synthroid  100 mcg daily. - Order thyroid  function tests in mid-July to reassess thyroid  status post-fluid management.  Insomnia Insomnia with disrupted sleep patterns. Ambien   prescribed but concerns about long-term use due to potential dementia risk and dependency. Exploring alternative options for sleep management. - Discontinue Ambien  due to potential risks. - Consider magnesium  glycinate as an alternative for sle ep support.  Depression Depression managed with venlafaxine . - Continue venlafaxine  as prescribed.       Return in about 3 months (around 09/28/2024) for as scheduled.       Jon Eva, MD  [facility] [phone] (phone) [phone] (fax)  [facility]

## 2024-06-28 NOTE — Telephone Encounter (Signed)
 LVMTCB. OK to advise if call is returned  Dr.B - please advise in regards to OT and PT orders they are needing for after care

## 2024-06-28 NOTE — Telephone Encounter (Signed)
 Patient stated that [facility] PT/OT starts tomorrow. I think they just need to know attending to attach to [facility] orders/communications

## 2024-06-29 ENCOUNTER — Telehealth: Payer: Self-pay | Admitting: Family

## 2024-06-29 NOTE — Progress Notes (Unsigned)
 Advanced [facility] Failure Clinic Note   Referring Physician: admission PCP: Myrla Jon HERO, MD Cardiologist: Stanly DELENA Leavens, MD   Chief Complaint: fatigue   HPI:  Mr Minkin is a 78 y/o male with a history of HTN, obesity, PAF on Eliquis , hypothyroidism, LBBB (06/25), alcohol use, BPH and new diagnosis heart failure (06/25).     Pertinent cardiac history: Echo in 09/2017 noted LVEF of 55-60%, mild LVH. Echo in 12/2020 showed LVEF 55-60%, mild MR.   Admitted 06/17/24 with shortness of breath and 20 lb weight gain over several months. On admission, BNP was >4500, HS-troponin was 34. Chest x-ray noted trace pleural effusions and slight central congestion. ECG noted new LBBB. Echo 06/18/24: EF 20-25%, mild LVH, moderately reduced RV function, moderate biatrial dilation, moderate to severe MR. IV diuresed with transition to oral diuretics. RHC 06/21/24: RA 5, PA 43/22 (29), PCWP 20, CO/CI 5.8/2.9, PVR 1.6. cMRI 06/22/24 not revealing for infiltrative disease.   He presents today, with his wife, for his initial HF visit with a chief complaint of fatigue (improving). Has decreased appetite. Denies any shortness of breath, chest pain, palpitations, abdominal distention, pedal edema, dizziness or difficulty sleeping. He says that he's feeling much better since discharge.   Does ask about transferring his general cardiology care from [place] to Dr. Mady who did his catheterization. He says that they don't want to drive to [place] for care any longer. Voices displeasure with his recent hospital stay as he was in a semi-private room. He's also frustrated that no one can figure out as to why his heart function is so low.   Had home PT evaluation yesterday but he declined further visits. He plans to increase his activity on his own.   Denies smoking/ drug use. Continues to drink 2 (12oz) cans of beer nightly. His wife says that he used to drink 9 cans / night.    Review of Systems: [y] = yes, [ ]  = no    General: Weight gain [ ] ; Weight loss [ ] ; Anorexia [ ] ; Fatigue Davis.Dad ]; Fever [ ] ; Chills [ ] ; Weakness [ ]   Cardiac: Chest pain/pressure [ ] ; Resting SOB [ ] ; Exertional SOB [ ] ; Orthopnea [ ] ; Pedal Edema [ ] ; Palpitations [ ] ; Syncope [ ] ; Presyncope [ ] ; Paroxysmal nocturnal dyspnea[ ]   Pulmonary: Cough [ ] ; Wheezing[ ] ; Hemoptysis[ ] ; Sputum [ ] ; Snoring [ ]   GI: Vomiting[ ] ; Dysphagia[ ] ; Melena[ ] ; Hematochezia [ ] ; Heartburn[ ] ; Abdominal pain [ ] ; Constipation [ ] ; Diarrhea [ ] ; BRBPR [ ]   GU: Hematuria[ ] ; Dysuria [ ] ; Nocturia[ ]   Vascular: Pain in legs with walking [ ] ; Pain in feet with lying flat [ ] ; Non-healing sores [ ] ; Stroke [ ] ; TIA [ ] ; Slurred speech [ ] ;  Neuro: Headaches[ ] ; Vertigo[ ] ; Seizures[ ] ; Paresthesias[ ] ;Blurred vision [ ] ; Diplopia [ ] ; Vision changes [ ]   Ortho/Skin: Arthritis [ ] ; Joint pain [ ] ; Muscle pain [ ] ; Joint swelling [ ] ; Back Pain [ ] ; Rash [ ]   Psych: Depression[ ] ; Anxiety[ ]   Heme: Bleeding problems [ ] ; Clotting disorders [ ] ; Anemia [ ]   Endocrine: Diabetes [ ] ; Thyroid  dysfunction[ ]    Past Medical History:  Diagnosis Date   Adult hypothyroidism 05/31/2009   Anemia    hx of one time   Arthritis    OA   Closed intertrochanteric fracture of hip, left, initial encounter (HCC) 10/10/2016   Colon, diverticulosis 05/31/2009   Difficulty  sleeping    Diverticulitis large intestine 05/31/2009   Diverticulosis 2014   Facial nerve injury, 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   First degree AV block    GERD (gastroesophageal reflux disease)    GI symptom    had nausea / vomited once / frequent stools / getting better   Hyperlipidemia    Hypertension    Hypothyroidism    Mild carotid artery disease (HCC)    Mitral regurgitation    Mood changes    PAF (paroxysmal atrial fibrillation) (HCC)    Pneumonia    hx of   Tricuspid regurgitation    Tricuspid regurgitation     Current Outpatient Medications  Medication Sig Dispense  Refill   acetaminophen  (TYLENOL ) 500 MG tablet Take 1,500 mg by mouth every 6 (six) hours as needed for moderate pain (pain score 4-6).     apixaban  (ELIQUIS ) 5 MG TABS tablet Take 1 tablet (5 mg total) by mouth 2 (two) times daily. 180 tablet 1   atorvastatin  (LIPITOR) 40 MG tablet Take 1 tablet (40 mg total) by mouth daily. 30 tablet 0   Cholecalciferol  (VITAMIN D ) 2000 UNITS CAPS Take 2,000 Units by mouth every evening.      furosemide  (LASIX ) 40 MG tablet Take 1.5 tablets (60 mg total) by mouth daily. 30 tablet 3   levothyroxine  (SYNTHROID ) 100 MCG tablet Take 1 tablet (100 mcg total) by mouth daily. 90 tablet 2   magnesium  oxide (MAG-OX) 400 (240 Mg) MG tablet TAKE 1 TABLET TWICE DAILY . 180 tablet 3   metoprolol  succinate (TOPROL -XL) 25 MG 24 hr tablet Take 1 tablet (25 mg total) by mouth every evening. 30 tablet 0   Multiple Vitamin (MULTIVITAMIN WITH MINERALS) TABS tablet Take 1 tablet by mouth daily.      Multiple Vitamins-Minerals (PRESERVISION AREDS 2) CAPS Take 1 capsule by mouth 2 (two) times daily.      pantoprazole  (PROTONIX ) 40 MG tablet Take 1 tablet (40 mg total) by mouth daily. 90 tablet 3   potassium chloride  SA (KLOR-CON  M20) 20 MEQ tablet Take 1 tablet (20 mEq total) by mouth daily. 180 tablet 3   sacubitril -valsartan  (ENTRESTO ) 24-26 MG Take 1 tablet by mouth 2 (two) times daily. 60 tablet 0   spironolactone  (ALDACTONE ) 25 MG tablet Take 1 tablet (25 mg total) by mouth daily. 30 tablet 0   tamsulosin  (FLOMAX ) 0.4 MG CAPS capsule Take 1 capsule (0.4 mg total) by mouth daily after supper. 30 capsule 1   venlafaxine  XR (EFFEXOR -XR) 75 MG 24 hr capsule TAKE 1 CAPSULE EVERY DAY 90 capsule 3   zolpidem  (AMBIEN ) 5 MG tablet Take 1 tablet (5 mg total) by mouth at bedtime as needed for sleep. 20 tablet 0   No current facility-administered medications for this visit.    Allergies  Allergen Reactions   Sulfa Antibiotics Other (See Comments)    Unknown/childhood allergy unknown       Social History   Socioeconomic History   Marital status: Married    Spouse name: Scientist, product/process development   Number of children: 2   Years of education: Not on file   Highest education level: Professional school degree (e.g., MD, DDS, DVM, JD)  Occupational History   Occupation: retired, has degree in physiology  Tobacco Use   Smoking status: Former    Current packs/day: 0.00    Types: Cigarettes    Quit date: 12/29/1968    Years since quitting: 55.5   Smokeless tobacco: Never  Vaping Use   Vaping status: Never Used  Substance and Sexual Activity   Alcohol use: Yes    Alcohol/week: 21.0 standard drinks of alcohol    Types: 21 Cans of beer per week   Drug use: No   Sexual activity: Yes  Other Topics Concern   Not on file  Social History Narrative   Pt lives in [place] w/ wife.   Social Drivers of Corporate investment banker Strain: Low Risk  (10/08/2018)   Overall Financial Resource Strain (CARDIA)    Difficulty of Paying Living Expenses: Not hard at all  Food Insecurity: No Food Insecurity (06/18/2024)   Hunger Vital Sign    Worried About Running Out of Food in the Last Year: Never true    Ran Out of Food in the Last Year: Never true  Transportation Needs: No Transportation Needs (06/18/2024)   PRAPARE - Administrator, Civil Service (Medical): No    Lack of Transportation (Non-Medical): No  Physical Activity: Inactive (07/13/2023)   Exercise Vital Sign    Days of Exercise per Week: 0 days    Minutes of Exercise per Session: 0 min  Stress: No Stress Concern Present (07/13/2023)   [employer] of Occupational Health - Occupational Stress Questionnaire    Feeling of Stress : Not at all  Social Connections: Socially Isolated (06/18/2024)   Social Connection and Isolation Panel    Frequency of Communication with Friends and Family: Twice a week    Frequency of Social Gatherings with Friends and Family: Never    Attends Religious Services: Never    Doctor, general practice or Organizations: No    Attends Banker Meetings: Never    Marital Status: Married  Catering manager Violence: Not At Risk (06/18/2024)   Humiliation, Afraid, Rape, and Kick questionnaire    Fear of Current or Ex-Partner: No    Emotionally Abused: No    Physically Abused: No    Sexually Abused: No      Family History  Problem Relation Age of Onset   Breast cancer Mother    Colon cancer Neg Hx    Esophageal cancer Neg Hx    Stomach cancer Neg Hx    Rectal cancer Neg Hx    Vitals:   06/30/24 1535  BP: 111/79  Pulse: (!) 109  SpO2: 97%  Weight: 161 lb 3.2 oz (73.1 kg)   Wt Readings from Last 3 Encounters:  06/30/24 161 lb 3.2 oz (73.1 kg)  06/28/24 163 lb 3.2 oz (74 kg)  06/27/24 162 lb 6.4 oz (73.7 kg)   Lab Results  Component Value Date   CREATININE 1.23 06/27/2024   CREATININE 0.99 06/23/2024   CREATININE 0.89 06/22/2024    PHYSICAL EXAM:  General: Elderly appearing. 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 No rubs, gallops or murmurs Lungs: clear Abdomen: soft, nontender, nondistended. Extremities: no cyanosis, clubbing, rash, edema Neuro: alert & oriented X 3. Moves all 4 extremities w/o difficulty. Affect pleasant   ECG: ST with 1st degree AV block with LBBB (personally reviewed)   ASSESSMENT & PLAN:  1: Chronic heart failure with now reduced ejection fraction- - unclear etiology, mixed vs NICM by cath. New LBBB. ? Alcohol induced vs LBBB mediated. HX of spinal stenosis & urinary retention so could consider amyloid although cMRI did not reveal any infiltrative disease - NYHA  class II - euvolemic - weighing daily; reviewed parameters to call about - Echo in 09/2017 noted LVEF of 55-60%, mild LVH.  - Echo in 12/2020 showed LVEF 55-60%, mild MR.  - Echo 06/18/24: EF 20-25%, mild LVH, global hypokinesis, moderately reduced RV  function, moderate biatrial dilation, moderate to severe MR.  - RHC 06/21/24: RA 5, PA 43/22 (29), PCWP 20, CO/CI 5.8/2.9, PVR 1.6.  - cMRI 06/22/24 not revealing for infiltrative disease.  - continue furosemide  60mg  daily/ potassium 20meq daily - increase metoprolol  succinate to 50mg  daily - continue entresto  24/26mg  BID; BP may not tolerate titration - continue spironolactone  25mg  daily - recent urinary retention and foley catheter placement so hesitant to use SGLT2 right now - BNP 06/17/24 was >4500.0  2: HTN- - BP 111/79 - saw PCP Lollie) 07/25 - BMP 06/27/24 reviewed: sodium 136, potassium 4.2, creatinine 1.23 & GFR 60  3: PAF- - continue apixaban  5mg  BID - due to tachycardia, increase metoprolol  to 50mg  daily - [facility] cardiology referral to Dr End make today  4: LBBB- - EKG today is NSR, 1st degree AV block, LBBB - new LBBB 06/25  5: ETOH- - now drinking 2 twelve oz cans of beer daily - was drinking 9 cans daily - complete cessation discussed and explained that alcohol use could be a contributory factor to his reduced EF   Return in 3-4 weeks to see HF MD, sooner if needed.   Ellouise DELENA Class, FNP 06/29/24

## 2024-06-29 NOTE — Telephone Encounter (Signed)
 Copied from CRM [id number]. Topic: General - Other >> Jun 29, 2024  2:53 PM Turkey B wrote: Reason for CRM: Soni from [place] , states, pt declines Physical Therapy service

## 2024-06-29 NOTE — Telephone Encounter (Signed)
 Called to confirm/remind patient of their appointment at the [facility] Clinic on 06/30/24.   Appointment:   [x] Confirmed  [] Left mess   [] No answer/No voice mail  [] VM Full/unable to leave message  [] Phone not in service  Patient reminded to bring all medications and/or complete list.  Confirmed patient has transportation. Gave directions, instructed to utilize valet parking.

## 2024-06-30 ENCOUNTER — Telehealth: Payer: Self-pay | Admitting: Physician Assistant

## 2024-06-30 ENCOUNTER — Ambulatory Visit: Payer: Medicare (Managed Care) | Attending: Family | Admitting: Family

## 2024-06-30 ENCOUNTER — Ambulatory Visit: Payer: Self-pay | Admitting: Physician Assistant

## 2024-06-30 ENCOUNTER — Encounter: Payer: Self-pay | Admitting: Family

## 2024-06-30 VITALS — BP 111/79 | HR 109 | Wt 161.2 lb

## 2024-06-30 DIAGNOSIS — Z79899 Other long term (current) drug therapy: Secondary | ICD-10-CM | POA: Insufficient documentation

## 2024-06-30 DIAGNOSIS — I10 Essential (primary) hypertension: Secondary | ICD-10-CM

## 2024-06-30 DIAGNOSIS — R000 Tachycardia, unspecified: Secondary | ICD-10-CM | POA: Diagnosis not present

## 2024-06-30 DIAGNOSIS — F1010 Alcohol abuse, uncomplicated: Secondary | ICD-10-CM

## 2024-06-30 DIAGNOSIS — N401 Enlarged prostate with lower urinary tract symptoms: Secondary | ICD-10-CM

## 2024-06-30 DIAGNOSIS — I110 Hypertensive heart disease with heart failure: Secondary | ICD-10-CM | POA: Insufficient documentation

## 2024-06-30 DIAGNOSIS — N400 Enlarged prostate without lower urinary tract symptoms: Secondary | ICD-10-CM | POA: Diagnosis not present

## 2024-06-30 DIAGNOSIS — I447 Left bundle-branch block, unspecified: Secondary | ICD-10-CM | POA: Insufficient documentation

## 2024-06-30 DIAGNOSIS — Z8639 Personal history of other endocrine, nutritional and metabolic disease: Secondary | ICD-10-CM | POA: Insufficient documentation

## 2024-06-30 DIAGNOSIS — I440 Atrioventricular block, first degree: Secondary | ICD-10-CM | POA: Diagnosis not present

## 2024-06-30 DIAGNOSIS — I5022 Chronic systolic (congestive) heart failure: Secondary | ICD-10-CM | POA: Diagnosis present

## 2024-06-30 DIAGNOSIS — F1090 Alcohol use, unspecified, uncomplicated: Secondary | ICD-10-CM | POA: Diagnosis not present

## 2024-06-30 DIAGNOSIS — E039 Hypothyroidism, unspecified: Secondary | ICD-10-CM | POA: Diagnosis not present

## 2024-06-30 DIAGNOSIS — Z7901 Long term (current) use of anticoagulants: Secondary | ICD-10-CM | POA: Insufficient documentation

## 2024-06-30 DIAGNOSIS — I480 Paroxysmal atrial fibrillation: Secondary | ICD-10-CM | POA: Insufficient documentation

## 2024-06-30 DIAGNOSIS — I509 Heart failure, unspecified: Secondary | ICD-10-CM | POA: Diagnosis not present

## 2024-06-30 DIAGNOSIS — Z87891 Personal history of nicotine dependence: Secondary | ICD-10-CM | POA: Insufficient documentation

## 2024-06-30 MED ORDER — METOPROLOL SUCCINATE ER 50 MG PO TB24: 50.0000 mg | ORAL_TABLET | Freq: Every evening | ORAL | 5 refills | Status: DC

## 2024-06-30 NOTE — Telephone Encounter (Signed)
 The patient's wife, Eugene White, called and mentioned they are awaiting a return call regarding Eugene White's lab results from his visit on 06/27/2024 with [facility]. The patient is requesting a callback with the results.

## 2024-06-30 NOTE — Telephone Encounter (Signed)
 Noted

## 2024-06-30 NOTE — Patient Instructions (Addendum)
 It was a pleasure meeting you today!  Medication Changes:  INCREASE Metoprolol  50mg  (1 tab) every evening  Special Instructions // Education:  You have been referred to Dr. Mady at [facility] That office will be in touch with you in order to schedule an appointment.   Follow-Up in: Please follow up with the Advanced Heart Failure Clinic in 1 month with Dr. Cherrie.   At the Advanced [facility] Failure Clinic, you and your health needs are our priority. We have a designated team specialized in the treatment of Heart Failure. This Care Team includes your primary Heart Failure Specialized Cardiologist (physician), Advanced Practice Providers (APPs- Physician Assistants and Nurse Practitioners), and Pharmacist who all work together to provide you with the care you need, when you need it.   You may see any of the following providers on your designated Care Team at your next follow up:  Dr. Toribio Cherrie Dr. Ezra Shuck Dr. Ria Commander Dr. Odis Brownie Ellouise Class, FNP Jaun Bash, RPH-CPP  Please be sure to bring in all your medications bottles to every appointment.   Need to Contact Us :  If you have any questions or concerns before your next appointment please send us  a message through [place] or call our office at [phone].    TO LEAVE A MESSAGE FOR THE NURSE SELECT OPTION 2, PLEASE LEAVE A MESSAGE INCLUDING: YOUR NAME DATE OF BIRTH CALL BACK NUMBER REASON FOR CALL**this is important as we prioritize the call backs  YOU WILL RECEIVE A CALL BACK THE SAME DAY AS LONG AS YOU CALL BEFORE 4:00 PM

## 2024-07-05 ENCOUNTER — Other Ambulatory Visit: Payer: Self-pay | Admitting: Family Medicine

## 2024-07-05 DIAGNOSIS — E8779 Other fluid overload: Secondary | ICD-10-CM

## 2024-07-05 NOTE — Telephone Encounter (Signed)
 Copied from CRM [id number]. Topic: Clinical - Medication Refill >> Jul 05, 2024 11:37 AM Willma R wrote: Medication:  atorvastatin  (LIPITOR) 40 MG tablet furosemide  (LASIX ) 40 MG tablet sacubitril -valsartan  (ENTRESTO ) 24-26 MG spironolactone  (ALDACTONE ) 25 MG tablet tamsulosin  (FLOMAX ) 0.4 MG CAPS capsule  Has the patient contacted their pharmacy? Yes, call dr  This is the patient's preferred pharmacy:  [facility] - [place], [place] - [address] [place] [address] Phone: [phone] Fax: [phone]  Is this the correct pharmacy for this prescription? Yes If no, delete pharmacy and type the correct one.   Has the prescription been filled recently? No  Is the patient out of the medication? No, less than 2 weeks  Has the patient been seen for an appointment in the last year OR does the patient have an upcoming appointment? Yes  Can we respond through MyChart? Yes  Agent: Please be advised that Rx refills may take up to 3 business days. We ask that you follow-up with your pharmacy.

## 2024-07-07 NOTE — Telephone Encounter (Signed)
 Requested medications are due for refill today.  yes  Requested medications are on the active medications list.  yes  Last refill. 06/24/2024  Future visit scheduled.   yes  Notes to clinic.  All of these meds were prescribed by Dr. Jhonny    Requested Prescriptions  Pending Prescriptions Disp Refills   tamsulosin  (FLOMAX ) 0.4 MG CAPS capsule 30 capsule 1    Sig: Take 1 capsule (0.4 mg total) by mouth daily after supper.     Urology: Alpha-Adrenergic Blocker Failed - 07/07/2024  2:01 PM      Failed - PSA in normal range and within 360 days    No results found for: LABPSA, PSA, PSA1, ULTRAPSA       Passed - Last BP in normal range    BP Readings from Last 1 Encounters:  06/30/24 111/79         Passed - Valid encounter within last 12 months    Recent Outpatient Visits           1 week ago Acute HFrEF (heart failure with reduced ejection fraction) [facility])   [facility] [place], Jon HERO, MD   3 weeks ago Maxcine and fatigue   [place] [facility] Simmons-Robinson, [place], MD   3 months ago Essential (primary) hypertension   [place] [facility] [place], Jon HERO, MD       Future Appointments             In 3 months Bacigalupo, Jon HERO, MD [place] [facility]             spironolactone  (ALDACTONE ) 25 MG tablet 30 tablet 0    Sig: Take 1 tablet (25 mg total) by mouth daily.     Cardiovascular: Diuretics - Aldosterone Antagonist Passed - 07/07/2024  2:01 PM      Passed - Cr in normal range and within 180 days    Creatinine, Ser  Date Value Ref Range Status  06/27/2024 1.23 0.76 - 1.27 mg/dL Final         Passed - K in normal range and within 180 days    Potassium  Date Value Ref Range Status  06/27/2024 4.2 3.5 - 5.2 mmol/L Final         Passed - Na in normal range and within 180 days    Sodium  Date Value Ref Range Status  06/27/2024 136 134 - 144 mmol/L  Final         Passed - eGFR is 30 or above and within 180 days    GFR calc Af Amer  Date Value Ref Range Status  11/16/2020 100 >59 mL/min/1.73 Final    Comment:    **In accordance with recommendations from the NKF-ASN Task force,**   [facility] is in the process of updating its eGFR calculation to the   2021 CKD-EPI creatinine equation that estimates kidney function   without a race variable.    GFR, Estimated  Date Value Ref Range Status  06/23/2024 >60 >60 mL/min Final    Comment:    (NOTE) Calculated using the CKD-EPI Creatinine Equation (2021)    GFR  Date Value Ref Range Status  06/01/2014 117.00 >60.00 mL/min Final   eGFR  Date Value Ref Range Status  06/27/2024 60 >59 mL/min/1.73 Final         Passed - Last BP in normal range    BP Readings from Last 1 Encounters:  06/30/24 111/79  Passed - Valid encounter within last 6 months    Recent Outpatient Visits           1 week ago Acute HFrEF (heart failure with reduced ejection fraction) (HCC)   [place] [facility] [place], Jon HERO, MD   3 weeks ago Maxcine and fatigue   [place] [facility] Simmons-Robinson, [place], MD   3 months ago Essential (primary) hypertension   [place] [facility] [place], Jon HERO, MD       Future Appointments             In 3 months Bacigalupo, Jon HERO, MD [facility], [facility]             sacubitril -valsartan  (ENTRESTO ) 24-26 MG 60 tablet 0    Sig: Take 1 tablet by mouth 2 (two) times daily.     Off-Protocol Failed - 07/07/2024  2:01 PM      Failed - Medication not assigned to a protocol, review manually.      Passed - Valid encounter within last 12 months    Recent Outpatient Visits           1 week ago Acute HFrEF (heart failure with reduced ejection fraction) (HCC)   [place] [facility] [place], Jon HERO, MD   3 weeks ago Maxcine and fatigue   [facility] Simmons-Robinson, [place], MD   3 months ago Essential (primary) hypertension   [place] [facility] [place], Jon HERO, MD       Future Appointments             In 3 months Bacigalupo, Jon HERO, MD [facility], [facility]             furosemide  (LASIX ) 40 MG tablet 30 tablet 3    Sig: Take 1.5 tablets (60 mg total) by mouth daily.     Cardiovascular:  Diuretics - Loop Failed - 07/07/2024  2:01 PM      Failed - Cl in normal range and within 180 days    Chloride  Date Value Ref Range Status  06/27/2024 95 (L) 96 - 106 mmol/L Final         Passed - K in normal range and within 180 days    Potassium  Date Value Ref Range Status  06/27/2024 4.2 3.5 - 5.2 mmol/L Final         Passed - Ca in normal range and within 180 days    Calcium   Date Value Ref Range Status  06/27/2024 10.2 8.6 - 10.2 mg/dL Final   Calcium , Ion  Date Value Ref Range Status  06/21/2024 1.16 1.15 - 1.40 mmol/L Final         Passed - Na in normal range and within 180 days    Sodium  Date Value Ref Range Status  06/27/2024 136 134 - 144 mmol/L Final         Passed - Cr in normal range and within 180 days    Creatinine, Ser  Date Value Ref Range Status  06/27/2024 1.23 0.76 - 1.27 mg/dL Final         Passed - Mg Level in normal range and within 180 days    Magnesium   Date Value Ref Range Status  06/23/2024 2.1 1.7 - 2.4 mg/dL Final    Comment:    Performed at [facility], [address] [place], [place] [address]  Passed - Last BP in normal range    BP Readings from Last 1 Encounters:  06/30/24 111/79         Passed - Valid encounter within last 6 months    Recent Outpatient Visits           1 week ago Acute HFrEF (heart failure with reduced ejection fraction) [facility])   [place] [facility] [place], Jon HERO, MD   3 weeks ago Maxcine and fatigue   [place] [facility] Simmons-Robinson, [place], MD   3 months ago Essential (primary) hypertension   [place] [facility] [place], Jon HERO, MD       Future Appointments             In 3 months Bacigalupo, Jon HERO, MD [facility], [facility]             atorvastatin  (LIPITOR) 40 MG tablet 30 tablet 0    Sig: Take 1 tablet (40 mg total) by mouth daily.     Cardiovascular:  Antilipid - Statins Failed - 07/07/2024  2:01 PM      Failed - Lipid Panel in normal range within the last 12 months    Cholesterol, Total  Date Value Ref Range Status  04/07/2024 172 100 - 199 mg/dL Final   LDL Chol Calc (NIH)  Date Value Ref Range Status  04/07/2024 55 0 - 99 mg/dL Final   HDL  Date Value Ref Range Status  04/07/2024 103 >39 mg/dL Final   Triglycerides  Date Value Ref Range Status  04/07/2024 77 0 - 149 mg/dL Final         Passed - Patient is not pregnant      Passed - Valid encounter within last 12 months    Recent Outpatient Visits           1 week ago Acute HFrEF (heart failure with reduced ejection fraction) (HCC)   [place] [facility] [place], Jon HERO, MD   3 weeks ago Malaise and fatigue   [place] [facility] Simmons-Robinson, [place], MD   3 months ago Essential (primary) hypertension   [place] [facility] [place], Jon HERO, MD       Future Appointments             In 3 months Bacigalupo, Jon HERO, MD [facility], [facility]

## 2024-07-08 ENCOUNTER — Ambulatory Visit
Admission: RE | Admit: 2024-07-08 | Discharge: 2024-07-08 | Disposition: A | Discharge status: Home / Self Care | Payer: Medicare (Managed Care) | Source: Ambulatory Visit | Attending: Physician Assistant | Admitting: Physician Assistant

## 2024-07-08 DIAGNOSIS — N3289 Other specified disorders of bladder: Secondary | ICD-10-CM | POA: Insufficient documentation

## 2024-07-08 DIAGNOSIS — R3914 Feeling of incomplete bladder emptying: Secondary | ICD-10-CM | POA: Diagnosis not present

## 2024-07-08 DIAGNOSIS — N401 Enlarged prostate with lower urinary tract symptoms: Secondary | ICD-10-CM | POA: Diagnosis not present

## 2024-07-08 DIAGNOSIS — N2889 Other specified disorders of kidney and ureter: Secondary | ICD-10-CM | POA: Insufficient documentation

## 2024-07-11 ENCOUNTER — Ambulatory Visit: Payer: Medicare (Managed Care) | Admitting: Family Medicine

## 2024-07-13 ENCOUNTER — Other Ambulatory Visit: Payer: Self-pay | Admitting: Family Medicine

## 2024-07-13 NOTE — Telephone Encounter (Signed)
 [facility] is asking for refills on:  Tamsulosin  HCL 0.4 mg.  Entresto  24 mg-26 mg. Atorvastatin  40 mg.  Spironolactone  25 mg.

## 2024-07-14 ENCOUNTER — Telehealth: Payer: Self-pay | Admitting: Family Medicine

## 2024-07-14 ENCOUNTER — Other Ambulatory Visit: Payer: Self-pay | Admitting: Family Medicine

## 2024-07-14 MED ORDER — ATORVASTATIN CALCIUM 40 MG PO TABS: 40.0000 mg | ORAL_TABLET | Freq: Every day | ORAL | 1 refills | Status: DC

## 2024-07-14 MED ORDER — SPIRONOLACTONE 25 MG PO TABS: 25.0000 mg | ORAL_TABLET | Freq: Every day | ORAL | 1 refills | Status: DC

## 2024-07-14 MED ORDER — SACUBITRIL-VALSARTAN 24-26 MG PO TABS: 1.0000 | ORAL_TABLET | Freq: Two times a day (BID) | ORAL | 1 refills | Status: DC

## 2024-07-14 NOTE — Telephone Encounter (Signed)
 Fyi duplicate refill requests

## 2024-07-14 NOTE — Telephone Encounter (Signed)
 Patient wants 90 days worth of pills for  Atorvastatin  40 mg. Spironolactone   Tamsulosin  hcl 0.4 mg.

## 2024-07-14 NOTE — Telephone Encounter (Signed)
 Copied from CRM [id number]. Topic: Clinical - Medication Refill >> Jul 14, 2024  4:03 PM Winona R wrote: Medication:  atorvastatin  (LIPITOR) 40 MG tablet- 8 days left  tamsulosin  (FLOMAX ) 0.4 MG CAPS capsule -6 days left  sacubitril -valsartan  (ENTRESTO ) 24-26 MG- 8 days left spironolactone  (ALDACTONE ) 25 MG tablet- 8 days left   Has the patient contacted their pharmacy? Yes, the pharmacy has not heard back from the office  This is the patient's preferred pharmacy:  [id number] [place] [facility] - [address] [place] [address] Phone: [phone] Fax: [phone]   Is this the correct pharmacy for this prescription? Yes If no, delete pharmacy and type the correct one.   Has the prescription been filled recently? Yes  Is the patient out of the medication? No  Has the patient been seen for an appointment in the last year OR does the patient have an upcoming appointment? Yes  Can we respond through MyChart? No  Agent: Please be advised that Rx refills may take up to 3 business days. We ask that you follow-up with your pharmacy.  ****** Pts wife would like to have this done asap as they are tryiong to go out of town for two weeks.

## 2024-07-14 NOTE — Addendum Note (Signed)
 Addended by: CHERRY CHIQUITA HERO on: 07/14/2024 04:41 PM   Modules accepted: Orders

## 2024-07-14 NOTE — Telephone Encounter (Signed)
 Patient is requesting 90 days worth of Entresto  instead of #60. Send to [address]

## 2024-07-15 MED ORDER — TAMSULOSIN HCL 0.4 MG PO CAPS: 0.4000 mg | ORAL_CAPSULE | Freq: Every day | ORAL | 0 refills | Status: DC

## 2024-07-15 NOTE — Telephone Encounter (Signed)
 Called and spoke to the pt to inform him all the requested medication has been filled expect Flomax , that medication was filled but another provider Jhonny, Sudheer B, he stated he understood and would reach out to request that medication

## 2024-07-15 NOTE — Telephone Encounter (Signed)
 Prescription were sent in yesterday and tamsulosin  today.

## 2024-07-16 LAB — TSH+FREE T4
Free T4: 1.1 ng/dL (ref 0.82–1.77)
TSH: 1.37 u[IU]/mL (ref 0.450–4.500)

## 2024-07-18 ENCOUNTER — Ambulatory Visit: Payer: Self-pay | Admitting: Family Medicine

## 2024-07-18 ENCOUNTER — Telehealth: Payer: Self-pay | Admitting: Physician Assistant

## 2024-07-18 DIAGNOSIS — N401 Enlarged prostate with lower urinary tract symptoms: Secondary | ICD-10-CM

## 2024-07-18 NOTE — Telephone Encounter (Signed)
 Patient called on 07/15/24 and spoke with Access Nurse after our office was closed. He is needing a refill for Flomax . His PCP (Dr. Myrla)  will not refill due to insurance issue. He has 7 capsules left. Message is in chart under Media.

## 2024-07-25 NOTE — Progress Notes (Unsigned)
 Cardiology Office Note    Date:  07/26/2024   ID:  Eugene White, DOB 02/22/1946, MRN [id number]  PCP:  Myrla Jon HERO, MD  Cardiologist:  Stanly DELENA Leavens, MD  Electrophysiologist:  None   Chief Complaint: Hospital follow-up  History of Present Illness:   Eugene White is a 78 y.o. 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  During surgery for left femoral neck nail removal in 2018 he went into A-fib with RVR, which was a new diagnosis for him.  Echo in 09/2017 showed an EF of 55 to 60% with no regional wall motion abnormalities and mildly dilated left atrium.  He was started on apixaban  and underwent successful TEE guided DCCV in 12/2017 with TEE at that time showing an EF of 55 to 60% with moderate mitral and tricuspid regurgitation and mild aortic insufficiency.  He required repeat DCCV in 12/2018 for recurrence of A-fib and was ultimately placed on flecainide  in 08/2019 and underwent repeat DCCV at that time.  He did not have a prior ischemic assessment.  ETT was recommended in 2021, though he wished to defer.  Carotid duplex in 2019 showed 1 to 39% stenosis on the left that was felt to be normal by prior cardiologist.  In 12/2020, prior cardiologist requested the patient go off of flecainide  since he had not had any recurrent issues with A-fib.  Echo in 12/2020 showed an EF of 55 to 60%, no regional wall motion abnormalities, normal RV systolic function and ventricular cavity size, mildly dilated left atrium, mild mitral regurgitation, mild to moderate aortic valve sclerosis without evidence  of stenosis, and an estimated right atrial pressure of 3 mmHg.  He was seen in the ED in 2023 with left flank pain and was found to have significant left-sided hydronephrosis and urethral stenosis status post stent placement and renal bleeding from a prior fall resulting in a hemoglobin of 8.  He was seen by general cardiology in the office in 04/2024 with notation of physical activity being limited due to spinal stenosis and arthritis.  He also reported issues with sleeping and short windedness that he attributed to inactivity.  He was admitted to the hospital on 06/17/2024 with progressive exertional fatigue and dyspnea that started 3 weeks prior with unexplained weight gain, lower extremity edema, and orthopnea.  He was admitted with acute CHF with a BNP greater than 4500.  Sodium 129.  High-sensitivity troponin trended to 24.  EKG showed new left bundle branch block.  He was IV diuresed with good response and improvement in symptoms.  Echo showed an EF of 20 to 25%, global hypokinesis, mild LVH, moderately reduced RV systolic function with mildly enlarged ventricular cavity size, moderately elevated RVSP estimated at 53.6 mmHg, moderate biatrial enlargement, moderate pleural effusion in the left lateral region, moderate to severe mitral regurgitation, moderate aortic insufficiency, aortic valve sclerosis without evidence of stenosis, and moderate pulmonic regurgitation.  Care was subsequently transitioned to advanced heart failure service.  R/LHC showed severe single-vessel CAD with diffusely diseased LAD with focal stenoses in the mid and distal vessel of up to 70%.  Ramus intermedius, LCx, and RCA demonstrated mild  to moderate nonobstructive CAD.  The degree of LV reduction was out of proportion to CAD burden suggestive of a predominantly nonischemic cardiomyopathy.  Mildly elevated left heart filling pressures, normal to mildly elevated right heart filling pressure, mild to moderate pulmonary hypertension,  and normal cardiac output/index as outlined below.  Medical therapy was favored for diffuse LAD disease with PCI to the LAD considered for refractory angina, though the disease was fairly diffuse and the vessel was relatively small.  Cardiac MRI showed an severely reduced LV systolic function with EF of 11%, no LGE or scar, no evidence of infiltrative or inflammatory disease, severely reduced RV systolic function, and no significant valvular abnormalities.   Discharge cardiac medications: Apixaban  5 mg twice daily, atorvastatin  40 mg daily, Lasix  60 mg daily, Toprol -XL 25 mg nightly, Entresto  24/26 mg twice daily, spironolactone  25 mg daily   He followed up with the  CHF clinic on 06/30/2024.  At that time he continued to drink 2, twelve-ounce cans of beer nightly (previously was drinking 9 cans per night.  At that visit, Toprol  was titrated to 50 mg daily with continuation of Entresto  24/26 mg twice daily, spironolactone  25 mg, and furosemide  60 mg daily.  SGLT2 has been deferred given urinary retention and Foley catheter placement.   He comes in today accompanied by his wife and is without symptoms of angina or cardiac decompensation.  He continues to note some exertional shortness of breath with minimal activity such as moving around the house.  No significant lower extremity swelling, abdominal distention, or progressive orthopnea.  Early satiety that he was experiencing prior to admission is improved.  He does try and monitor sodium intake and is drinking approximately 2 L of liquid daily.  Both patient and wife indicate he has significantly decreased his alcohol consumption, indicating he has not drink alcohol since he left the hospital.  No falls or symptoms concerning for bleeding.  No dizziness, presyncope, or syncope.   Labs independently reviewed: 06/2024 - TSH normal 05/2024 - BUN 16, serum creatinine 1.23, potassium 4.2, magnesium  2.1, Hgb 11.5, PLT 170, LP(a) 10.0, albumin 3.5,  AST/ALT normal, BNP greater than 4500 03/2024 - A1c 4.6, TC 172, TG 77, HDL 103, LDL 55  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   First degree AV block    GERD (gastroesophageal reflux disease)    GI symptom    had nausea / vomited once / frequent stools / getting better   Hyperlipidemia    Hypertension    Hypothyroidism    Mild carotid artery disease (HCC)    Mitral regurgitation    Mood changes    PAF (paroxysmal atrial fibrillation) (HCC)    Pneumonia    hx of   Tricuspid regurgitation    Tricuspid regurgitation     Past Surgical History:  Procedure Laterality Date   CARDIOVERSION N/A 01/12/2018   Procedure: CARDIOVERSION;  Surgeon: Maranda Leim DEL, MD;  Location: [facility] ENDOSCOPY;  Service: Cardiovascular;  Laterality: N/A;   CARDIOVERSION N/A 01/26/2019   Procedure: CARDIOVERSION;  Surgeon: Jeffrie Oneil BROCKS, MD;  Location: [facility] ENDOSCOPY;  Service: Cardiovascular;  Laterality: N/A;   CARDIOVERSION N/A 09/30/2019   Procedure: CARDIOVERSION;  Surgeon: Pietro Redell RAMAN, MD;  Location: [facility] ENDOSCOPY;  Service: Cardiovascular;  Laterality: N/A;   CARDIOVERSION N/A 04/04/2022   Procedure: CARDIOVERSION;  Surgeon: Alvan Ronal BRAVO, MD;  Location: [facility] ENDOSCOPY;  Service: Cardiovascular;  Laterality: N/A;   COLONOSCOPY  2014   CYSTOSCOPY WITH STENT PLACEMENT Left 06/18/2022   Procedure: CYSTOSCOPY WITH STENT PLACEMENT;  Surgeon: Penne Knee, MD;  Location: [facility] ORS;  Service: Urology;  Laterality: Left;   CYSTOSCOPY WITH STENT PLACEMENT Left 07/21/2022   Procedure: CYSTOSCOPY WITH STENT EXCHANGE;  Surgeon: Penne Knee, MD;  Location:  [facility] ORS;  Service: Urology;  Laterality: Left;   FEMUR IM NAIL Left 10/11/2016   Procedure: INTRAMEDULLARY (IM) NAIL FEMORAL;  Surgeon: Norleen JINNY Maltos, MD;  Location: [facility] ORS;  Service: Orthopedics;  Laterality: Left;   HAND SURGERY   1973 / 2010 / 2015   right to release tendons   HARDWARE REMOVAL Left 10/05/2017   Procedure: Removal of left femoral nail;  Surgeon: Ernie Cough, MD;  Location: [facility] ORS;  Service: Orthopedics;  Laterality: Left;  90 mins   HEMATOMA EVACUATION Left 01/19/2018   Procedure: Irrigation and debridement, Evacuation of left total knee hematoma;  Surgeon: Ernie Cough, MD;  Location: [facility] ORS;  Service: Orthopedics;  Laterality: Left;   KNEE ARTHROSCOPY  1984 & 2001   twice   NASAL SEPTUM SURGERY     removal Left femoral nail      10/05/17 Dr. ERNIE   RIGHT HEART CATH AND CORONARY ANGIOGRAPHY N/A 06/21/2024   Procedure: RIGHT HEART CATH AND CORONARY ANGIOGRAPHY;  Surgeon: Mady Bruckner, MD;  Location: [facility] INVASIVE CV LAB;  Service: Cardiovascular;  Laterality: N/A;   SHOULDER OPEN ROTATOR CUFF REPAIR  1999 & 2006   Right and left   TEE WITH CARDIOVERSION  01/12/2018   TEE WITHOUT CARDIOVERSION N/A 01/12/2018   Procedure: TRANSESOPHAGEAL ECHOCARDIOGRAM (TEE);  Surgeon: Maranda Leim DEL, MD;  Location: [facility] ENDOSCOPY;  Service: Cardiovascular;  Laterality: N/A;   TONSILLECTOMY     TOTAL KNEE ARTHROPLASTY Right 01/09/2015   Procedure: RIGHT TOTAL KNEE ARTHROPLASTY;  Surgeon: Cough JONETTA Ernie, MD;  Location: [facility] ORS;  Service: Orthopedics;  Laterality: Right;   TOTAL KNEE ARTHROPLASTY Left 11/23/2017   Procedure: LEFT TOTAL KNEE ARTHROPLASTY;  Surgeon: Ernie Cough, MD;  Location: [facility] ORS;  Service: Orthopedics;  Laterality: Left;  90 mins   URETERAL BIOPSY Left 07/21/2022   Procedure: URETERAL BIOPSY;  Surgeon: Penne Knee, MD;  Location: [facility] ORS;  Service: Urology;  Laterality: Left;   URETEROSCOPY Left 07/21/2022   Procedure: URETEROSCOPY;  Surgeon: Penne Knee,  MD;  Location: [facility] ORS;  Service: Urology;  Laterality: Left;    Current Medications: Current Meds  Medication Sig   acetaminophen  (TYLENOL ) 500 MG tablet Take 1,500 mg by mouth every 6 (six) hours as needed for moderate pain (pain score 4-6).   apixaban  (ELIQUIS ) 5 MG TABS tablet Take 1 tablet (5 mg total) by mouth 2 (two) times daily.   atorvastatin  (LIPITOR) 40 MG tablet Take 1 tablet (40 mg total) by mouth daily.   Cholecalciferol  (VITAMIN D ) 2000 UNITS CAPS Take 2,000 Units by mouth every evening.    furosemide  (LASIX ) 40 MG tablet Take 1.5 tablets (60 mg total) by mouth daily.   levothyroxine  (SYNTHROID ) 100 MCG tablet Take 1 tablet (100 mcg total) by mouth daily.   magnesium  oxide (MAG-OX) 400 (240 Mg) MG tablet TAKE  1 TABLET TWICE DAILY .   metoprolol  succinate (TOPROL -XL) 50 MG 24 hr tablet Take 1 tablet (50 mg total) by mouth every evening.   Multiple Vitamin (MULTIVITAMIN WITH MINERALS) TABS tablet Take 1 tablet by mouth daily.    Multiple Vitamins-Minerals (PRESERVISION AREDS 2) CAPS Take 1 capsule by mouth 2 (two) times daily.    pantoprazole  (PROTONIX ) 40 MG tablet Take 1 tablet (40 mg total) by mouth daily.   potassium chloride  SA (KLOR-CON  M20) 20 MEQ tablet Take 1 tablet (20 mEq total) by mouth daily.   sacubitril -valsartan  (ENTRESTO ) 24-26 MG Take 1 tablet by mouth 2 (two) times daily.   spironolactone  (ALDACTONE ) 25 MG tablet Take 1 tablet (25 mg total) by mouth daily.   tamsulosin  (FLOMAX ) 0.4 MG CAPS capsule Take 1 capsule (0.4 mg total) by mouth daily after supper.   venlafaxine  XR (EFFEXOR -XR) 75 MG 24 hr capsule TAKE 1 CAPSULE EVERY DAY    Allergies:   Sulfa antibiotics   Social History   Socioeconomic History   Marital status: Married    Spouse name: Scientist, product/process development   Number of children: 2   Years of education: Not on file   Highest education level: Professional school degree (e.g., MD, DDS, DVM, JD)  Occupational History   Occupation: retired, has degree in  physiology  Tobacco Use   Smoking status: Former    Current packs/day: 0.00    Types: Cigarettes    Quit date: 12/29/1968    Years since quitting: 55.6   Smokeless tobacco: Never  Vaping Use   Vaping status: Never Used  Substance and Sexual Activity   Alcohol use: Yes    Alcohol/week: 21.0 standard drinks of alcohol    Types: 21 Cans of beer per week   Drug use: No   Sexual activity: Yes  Other Topics Concern   Not on file  Social History Narrative   Pt lives in [place] w/ wife.   Social Drivers of Corporate investment banker Strain: Low Risk  (10/08/2018)   Overall Financial Resource Strain (CARDIA)    Difficulty of Paying Living Expenses: Not hard at all  Food Insecurity: No Food Insecurity (06/18/2024)   Hunger Vital Sign    Worried About Running Out of Food in the Last Year: Never true    Ran Out of Food in the Last Year: Never true  Transportation Needs: No Transportation Needs (06/18/2024)   PRAPARE - Administrator, Civil Service (Medical): No    Lack of Transportation (Non-Medical): No  Physical Activity: Inactive (07/13/2023)   Exercise Vital Sign    Days of Exercise per Week: 0 days    Minutes of Exercise per Session: 0 min  Stress: No Stress Concern Present (07/13/2023)   [employer] of Occupational Health - Occupational Stress Questionnaire    Feeling of Stress : Not at all  Social Connections: Socially Isolated (06/18/2024)   Social Connection and Isolation Panel    Frequency of Communication with Friends and Family: Twice a week    Frequency of Social Gatherings with Friends and Family: Never    Attends Religious Services: Never    Database administrator or Organizations: No    Attends Engineer, structural: Never    Marital Status: Married     Family History:  The patient's family history includes Breast cancer in his mother. There is no history of Colon cancer, Esophageal cancer, Stomach cancer, or Rectal cancer.  ROS:    12-point review of  systems is negative unless otherwise noted in the HPI.   EKGs/Labs/Other Studies Reviewed:    Studies reviewed were summarized above. The additional studies were reviewed today:  Cardiac MRI 06/22/2024: IMPRESSION: 1. Normal LV size, severely reduced LV systolic function. LVEF 11%. 2.  No LGE or scar. 3.  No evidence for infiltrative or inflammatory disease. 4.  Severely reduced RV systolic function. 5.  No significant valvular abnormalities. __________  [facility] 06/21/2024: Conclusions: Severe single-vessel coronary artery disease with diffusely diseased LAD with focal stenoses in the mid and distal vessel of up to 70%.  Ramus intermedius, LCx, and RCA demonstrate mild-moderate, nonobstructive coronary artery disease.  Degree of LVEF reduction is out of proportion to CAD burden suggestive of predominantly nonischemic cardiomyopathy. Mildly elevated left heart filling pressures (PCWP 20 mmHg). Normal to mildly elevated right heart filling pressures (mean RA 5 mmHg, RVEDP 7 mmHg). Mild to moderate pulmonary hypertension (PAP 43/22, mean 29 mmHg). Normal Fick cardiac output/index (CO 5.8 L/min, CI 2.9 L/min/m).   Recommendations: Continue gentle diuresis and optimization of evidence-based heart failure therapy. Favor medical therapy of diffuse LAD disease.  If the patient were to develop angina, PCI to the LAD could be considered though disease is fairly diffuse and vessel is relatively small. Aggressive secondary prevention of coronary artery disease. Resume heparin  infusion 2 hours after TR band removal.  Transition to apixaban  as soon as tomorrow if there are no other invasive procedures planned and no evidence of bleeding/vascular injury at catheterization sites. __________  2D echo 06/18/2024: 1. Left ventricular ejection fraction, by estimation, is 20 to 25%. Left  ventricular ejection fraction by 2D MOD biplane is 21.7 %. The left  ventricle has severely  decreased function. The left ventricle demonstrates  global hypokinesis. There is mild  left ventricular hypertrophy. Left ventricular diastolic parameters are  indeterminate. The average left ventricular global longitudinal strain is  -6.8 %.   2. Right ventricular systolic function is moderately reduced. The right  ventricular size is mildly enlarged. There is moderately elevated  pulmonary artery systolic pressure. The estimated right ventricular  systolic pressure is 53.6 mmHg.   3. Left atrial size was moderately dilated.   4. Right atrial size was moderately dilated.   5. Moderate pleural effusion in the left lateral region.   6. The mitral valve is normal in structure. Moderate to severe mitral  valve regurgitation. No evidence of mitral stenosis.   7. The aortic valve is calcified. There is moderate calcification of the  aortic valve. Aortic valve regurgitation is moderate. Aortic valve  sclerosis/calcification is present, without any evidence of aortic  stenosis. Aortic valve mean gradient measures  3.0 mmHg.   8. Pulmonic valve regurgitation is moderate.   9. The inferior vena cava is normal in size with greater than 50%  respiratory variability, suggesting right atrial pressure of 3 mmHg.  __________  2D echo 01/21/2021: 1. Left ventricular ejection fraction, by estimation, is 55 to 60%. The  left ventricle has normal function. The left ventricle has no regional  wall motion abnormalities. Left ventricular diastolic function could not  be evaluated.   2. Right ventricular systolic function is normal. The right ventricular  size is normal.   3. Left atrial size was mildly dilated.   4. The mitral valve is normal in structure. Mild mitral valve  regurgitation. No evidence of mitral stenosis.   5. The aortic valve is normal in structure. There is mild calcification  of the aortic valve. There is  moderate thickening of the aortic valve.  Aortic valve regurgitation is not  visualized. Mild to moderate aortic  valve sclerosis/calcification is  present, without any evidence of aortic stenosis.   6. The inferior vena cava is normal in size with greater than 50%  respiratory variability, suggesting right atrial pressure of 3 mmHg.   Comparison(s): No significant change from prior study.  __________  TEE 01/12/2018: - Left ventricle: Systolic function was normal. The estimated    ejection fraction was in the range of 55% to 60%. Wall motion was    normal; there were no regional wall motion abnormalities.  - Aortic valve: There was mild regurgitation.  - Mitral valve: There was moderate regurgitation.  - Left atrium: The atrium was dilated. No evidence of thrombus in    the atrial cavity or appendage. No evidence of thrombus in the    atrial cavity or appendage. No evidence of thrombus in the atrial    cavity or appendage.  - Right atrium: The atrium was dilated. No evidence of thrombus in    the atrial cavity or appendage.  - Atrial septum: No defect or patent foramen ovale was identified.  - Tricuspid valve: There was moderate regurgitation.   Impressions:   - No cardiac source of emboli was indentified. The study was    followed by a successful cardioversion.  __________  2D echo 10/05/2017: - Left ventricle: The cavity size was normal. Wall thickness was    increased in a pattern of mild LVH. Systolic function was normal.    The estimated ejection fraction was in the range of 55% to 60%.    Wall motion was normal; there were no regional wall motion    abnormalities.  - Left atrium: The atrium was mildly dilated.   Impressions:   - Normal LV function; mild LVH; mild LAE.     EKG:  EKG is ordered today.  The EKG ordered today demonstrates NSR, 67 bpm, first-degree AV block, possible prior anterior infarct, anterolateral T wave inversion, when compared to prior tracing LBBB is no longer present  Recent Labs: 06/17/2024: B Natriuretic Peptide  >4,500.0 06/22/2024: ALT 22 06/23/2024: Hemoglobin 11.5; Magnesium  2.1; Platelets 170 06/27/2024: BUN 16; Creatinine, Ser 1.23; Potassium 4.2; Sodium 136 07/15/2024: TSH 1.370  Recent Lipid Panel    Component Value Date/Time   CHOL 172 04/07/2024 1453   TRIG 77 04/07/2024 1453   HDL 103 04/07/2024 1453   CHOLHDL 2.6 04/02/2023 1611   CHOLHDL 3.8 10/11/2016 0337   VLDL UNABLE TO CALCULATE IF TRIGLYCERIDE OVER 400 mg/dL 89/85/7982 9662   LDLCALC 55 04/07/2024 1453    PHYSICAL EXAM:    VS:  BP (!) 102/58 (BP Location: Right Arm, Patient Position: Sitting, Cuff Size: Normal)   Pulse 67   Ht 5' 11 (1.803 m)   Wt 158 lb (71.7 kg)   SpO2 94%   BMI 22.04 kg/m   BMI: Body mass index is 22.04 kg/m.  Physical Exam Vitals reviewed.  Constitutional:      Appearance: He is well-developed.  HENT:     Head: Normocephalic and atraumatic.  Eyes:     General:        Right eye: No discharge.        Left eye: No discharge.  Neck:     Vascular: No JVD.  Cardiovascular:     Rate and Rhythm: Normal rate and regular rhythm.     Pulses:  Posterior tibial pulses are 2+ on the right side and 2+ on the left side.     Heart sounds: S1 normal and S2 normal. Heart sounds not distant. No midsystolic click and no opening snap. Murmur heard.     Systolic murmur is present with a grade of 2/6 at the upper left sternal border.     No friction rub.  Pulmonary:     Effort: Pulmonary effort is normal. No respiratory distress.     Breath sounds: Normal breath sounds. No decreased breath sounds, wheezing, rhonchi or rales.  Abdominal:     General: There is no distension.     Palpations: Abdomen is soft.     Tenderness: There is no abdominal tenderness.  Musculoskeletal:     Cervical back: Normal range of motion.     Right lower leg: No edema.     Left lower leg: No edema.  Skin:    General: Skin is warm and dry.     Nails: There is no clubbing.  Neurological:     Mental Status: He is alert  and oriented to person, place, and time.  Psychiatric:        Speech: Speech normal.        Behavior: Behavior normal.        Thought Content: Thought content normal.        Judgment: Judgment normal.     Wt Readings from Last 3 Encounters:  07/26/24 158 lb (71.7 kg)  06/30/24 161 lb 3.2 oz (73.1 kg)  06/28/24 163 lb 3.2 oz (74 kg)     ASSESSMENT & PLAN:   Severe biventricular failure with LBBB: NYHA class III.  Appears euvolemic.  Cardiomyopathy is out of proportion to underlying single-vessel CAD as noted during cardiac cath and favored to be nonischemic in etiology.  However, etiology does remain somewhat unclear.  Cardiac MRI without evidence of infiltrative or inflammatory process.  Query if this is left bundle branch block mediated with some component of alcoholic cardiomyopathy contributing as well.  LBBB is not present on EKG today.  Continue current GDMT including metoprolol  succinate 50 mg, Entresto  24/26 mg twice daily, spironolactone  25 mg, and furosemide  60 mg daily.  Check BMP and BNP.  Anticipate follow-up echo in 1 to 2 months to reevaluate LV systolic function on maximally tolerated GDMT, particularly if QRS remains narrow.  He has a follow-up scheduled with the advanced heart failure service later this week.  CHF diet discussed.  CAD involving the native coronary arteries without angina: He is without symptoms of angina or cardiac decompensation.  Cardiac cath during admission showed single-vessel CAD with a diffusely diseased LAD with focal stenoses in the mid and distal vessel of up to 70% with mild to moderate nonobstructive disease involving the ramus intermedius, LCx, and RCA.  Medical therapy favored for diffuse LAD disease with reservation for PCI for refractory symptoms given fairly diffuse disease and relatively small vessel size.  Cardiomyopathy is out of proportion to underlying CAD.  On apixaban  in lieu of aspirin  given underlying A-fib to minimize bleeding risk with  continuation of atorvastatin  40 mg.  No indication for further ischemic testing at this time.  PAF: Maintaining sinus rhythm on metoprolol  50 mg nightly.  CHADS2VASc at least 5 (CHF, HTN, age x 2, vascular disease).  He remains on apixaban  5 mg twice daily and does not meet reduced dosing criteria.  Check BMP and CBC.  Valvular heart disease: Echo during admission showed moderate to severe  mitral regurgitation felt to be functional.  Cardiac MRI without significant valvular abnormalities noted on report.  Heart failure optimization.  EtOH abuse: Has significantly decreased alcohol use and indicates he has not drank since hospital discharge.  Cannot exclude this as a contributing factor to his cardiomyopathy.  HTN: Blood pressure is well-controlled and stable in the office today.  Relative hypotension precludes escalation of GDMT.  Continue pharmacotherapy as outlined above.  HLD: LDL 55 in 05/2024.  He remains on atorvastatin  40 mg.  Carotid artery disease: 1 to 39% left ICA stenosis with no evidence of atherosclerotic plaque or stenosis in the right carotid system at that time.  Apixaban  in lieu of aspirin  given A-fib with continuation of atorvastatin  40 mg.  Macrocytic anemia: Likely in the setting of underlying alcohol use.  Denies symptoms concerning for bleeding.  Check CBC.  Hypokalemia/hypomagnesemia: Repleted, and at goal upon hospital discharge.  Likely in the setting of EtOH use.     Disposition: F/u with Dr. Mady (patient/family would like to transition to [facility] office and establish with Dr. Mady) in 2 months and advanced heart failure team as directed.    Medication Adjustments/Labs and Tests Ordered: Current medicines are reviewed at length with the patient today.  Concerns regarding medicines are outlined above. Medication changes, Labs and Tests ordered today are summarized above and listed in the Patient Instructions accessible in Encounters.   Signed, Bernardino Bring,  PA-C 07/26/2024 4:56 PM     [facility] [place] [address] [place], [place] [address] [phone]

## 2024-07-26 ENCOUNTER — Ambulatory Visit: Payer: Medicare (Managed Care) | Attending: Physician Assistant | Admitting: Physician Assistant

## 2024-07-26 ENCOUNTER — Encounter: Payer: Self-pay | Admitting: Physician Assistant

## 2024-07-26 VITALS — BP 102/58 | HR 67 | Ht 71.0 in | Wt 158.0 lb

## 2024-07-26 DIAGNOSIS — I5082 Biventricular heart failure: Secondary | ICD-10-CM | POA: Diagnosis not present

## 2024-07-26 DIAGNOSIS — E785 Hyperlipidemia, unspecified: Secondary | ICD-10-CM | POA: Diagnosis not present

## 2024-07-26 DIAGNOSIS — I5022 Chronic systolic (congestive) heart failure: Secondary | ICD-10-CM | POA: Diagnosis not present

## 2024-07-26 DIAGNOSIS — F1010 Alcohol abuse, uncomplicated: Secondary | ICD-10-CM

## 2024-07-26 DIAGNOSIS — I480 Paroxysmal atrial fibrillation: Secondary | ICD-10-CM

## 2024-07-26 DIAGNOSIS — Z79899 Other long term (current) drug therapy: Secondary | ICD-10-CM

## 2024-07-26 DIAGNOSIS — I447 Left bundle-branch block, unspecified: Secondary | ICD-10-CM

## 2024-07-26 DIAGNOSIS — I2510 Atherosclerotic heart disease of native coronary artery without angina pectoris: Secondary | ICD-10-CM

## 2024-07-26 DIAGNOSIS — I779 Disorder of arteries and arterioles, unspecified: Secondary | ICD-10-CM

## 2024-07-26 DIAGNOSIS — I38 Endocarditis, valve unspecified: Secondary | ICD-10-CM | POA: Diagnosis not present

## 2024-07-26 DIAGNOSIS — I10 Essential (primary) hypertension: Secondary | ICD-10-CM

## 2024-07-26 NOTE — Patient Instructions (Signed)
 Medication Instructions:  Your physician recommends that you continue on your current medications as directed. Please refer to the Current Medication list given to you today.  *If you need a refill on your cardiac medications before your next appointment, please call your pharmacy*  Lab Work: Your provider would like for you to have following labs drawn today CBC, BNP, and BMP.   If you have labs (blood work) drawn today and your tests are completely normal, you will receive your results only by: MyChart Message (if you have MyChart) OR A paper copy in the mail If you have any lab test that is abnormal or we need to change your treatment, we will call you to review the results.  Follow-Up: At [facility], you and your health needs are our priority.  As part of our continuing mission to provide you with exceptional heart care, our providers are all part of one team.  This team includes your primary Cardiologist (physician) and Advanced Practice Providers or APPs (Physician Assistants and Nurse Practitioners) who all work together to provide you with the care you need, when you need it.  Your next appointment:   1-2 month(s)  Provider:   Lonni Hanson, MD

## 2024-07-27 ENCOUNTER — Ambulatory Visit: Payer: Self-pay | Admitting: Physician Assistant

## 2024-07-27 DIAGNOSIS — E8779 Other fluid overload: Secondary | ICD-10-CM

## 2024-07-27 LAB — BASIC METABOLIC PANEL WITH GFR
BUN/Creatinine Ratio: 31 — ABNORMAL HIGH (ref 10–24)
BUN: 31 mg/dL — ABNORMAL HIGH (ref 8–27)
CO2: 22 mmol/L (ref 20–29)
Calcium: 9.6 mg/dL (ref 8.6–10.2)
Chloride: 98 mmol/L (ref 96–106)
Creatinine, Ser: 1 mg/dL (ref 0.76–1.27)
Glucose: 96 mg/dL (ref 70–99)
Potassium: 5.1 mmol/L (ref 3.5–5.2)
Sodium: 137 mmol/L (ref 134–144)
eGFR: 77 mL/min/1.73 (ref >59)

## 2024-07-27 LAB — CBC
Hematocrit: 30.7 % — ABNORMAL LOW (ref 37.5–51.0)
Hemoglobin: 10 g/dL — ABNORMAL LOW (ref 13.0–17.7)
MCH: 33.9 pg — ABNORMAL HIGH (ref 26.6–33.0)
MCHC: 32.6 g/dL (ref 31.5–35.7)
MCV: 104 fL — ABNORMAL HIGH (ref 79–97)
Platelets: 243 x10E3/uL (ref 150–450)
RBC: 2.95 x10E6/uL — ABNORMAL LOW (ref 4.14–5.80)
RDW: 13.1 % (ref 11.6–15.4)
WBC: 5.9 x10E3/uL (ref 3.4–10.8)

## 2024-07-27 LAB — BRAIN NATRIURETIC PEPTIDE: BNP: 205.7 pg/mL — ABNORMAL HIGH (ref 0.0–100.0)

## 2024-07-27 MED ORDER — TAMSULOSIN HCL 0.4 MG PO CAPS: 0.4000 mg | ORAL_CAPSULE | Freq: Every day | ORAL | 3 refills | Status: DC

## 2024-07-27 NOTE — Telephone Encounter (Signed)
 Refill sent. Pt informed, voiced understanding.

## 2024-07-27 NOTE — Addendum Note (Signed)
 Addended byBETHA CORIE PLATER on: 07/27/2024 12:29 PM   Modules accepted: Orders

## 2024-07-28 MED ORDER — FUROSEMIDE 40 MG PO TABS: 40.0000 mg | ORAL_TABLET | Freq: Every day | ORAL | 3 refills | Status: DC

## 2024-07-29 ENCOUNTER — Ambulatory Visit: Payer: Medicare (Managed Care) | Attending: Internal Medicine | Admitting: Internal Medicine

## 2024-07-29 VITALS — BP 95/71 | HR 91 | Wt 158.0 lb

## 2024-07-29 DIAGNOSIS — I10 Essential (primary) hypertension: Secondary | ICD-10-CM

## 2024-07-29 DIAGNOSIS — I480 Paroxysmal atrial fibrillation: Secondary | ICD-10-CM | POA: Diagnosis not present

## 2024-07-29 DIAGNOSIS — I5082 Biventricular heart failure: Secondary | ICD-10-CM

## 2024-07-29 DIAGNOSIS — F1010 Alcohol abuse, uncomplicated: Secondary | ICD-10-CM | POA: Diagnosis not present

## 2024-07-29 DIAGNOSIS — I447 Left bundle-branch block, unspecified: Secondary | ICD-10-CM

## 2024-07-29 MED ORDER — SPIRONOLACTONE 25 MG PO TABS: 12.5000 mg | ORAL_TABLET | Freq: Every day | ORAL | 3 refills | Status: DC

## 2024-07-29 MED ORDER — METOPROLOL SUCCINATE ER 25 MG PO TB24: 12.5000 mg | ORAL_TABLET | Freq: Every day | ORAL | 3 refills | Status: DC

## 2024-07-29 MED ORDER — SACUBITRIL-VALSARTAN 24-26 MG PO TABS: ORAL_TABLET | ORAL | 3 refills | Status: AC

## 2024-07-29 NOTE — Progress Notes (Addendum)
 Advanced Heart Failure Clinic Note   Referring Physician: admission PCP: Myrla Jon HERO, MD Cardiologist: Stanly DELENA Leavens, MD   Chief Complaint: fatigue   HPI:  Eugene White is a 78 y/o male with a history of HTN, obesity, PAF,, LBBB (06/25), alcohol use, BPH and systolic HF (onset 06/25).     Echo in 09/2017 LVEF 55-60%, mild LVH. Echo 12/2020  LVEF 55-60%, mild Eugene.   Admitted 06/17/24 with shortness of breath and 20 lb weight gain over several months. BNP  >4500, HS-troponin 34. ECG new LBBB. Echo EF 20-25%, mild LVH, moderately reduced RV function, mod-sev Eugene.  RHC 06/21/24: RA 5, PA 43/22 (29), PCWP 20, CO/CI 5.8/2.9, PVR 1.6. cMRI 06/22/24 LVEF 11% RV severely decreased No LGE Coronary angio with mod to sev diffuse LAD disease up to 70%. Non-obstructive CAD in RCA and LCX -> medical therapy  He presents today, with his wife. Says he is very fatigued. Is comes off as very angry/frustrated. Gets SOB with ADLs. No edema, orthopnea or PND. Wife follows BP closely at home SBP typically in 80s. Gets dizzy when he moves around a lot. Compliant with meds. Wife is retired Charity fundraiser Lasix  recently decreased from 60 to 40 daily by [employer]. Says he has stopped drinking beer completely for almost 2 months.    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   First degree AV block    GERD (gastroesophageal reflux disease)    GI symptom    had nausea / vomited once / frequent stools / getting better   Hyperlipidemia    Hypertension    Hypothyroidism    Mild carotid artery disease (HCC)    Mitral regurgitation    Mood changes    PAF  (paroxysmal atrial fibrillation) (HCC)    Pneumonia    hx of   Tricuspid regurgitation    Tricuspid regurgitation     Current Outpatient Medications  Medication Sig Dispense Refill   acetaminophen  (TYLENOL ) 500 MG tablet Take 1,500 mg by mouth every 6 (six) hours as needed for moderate pain (pain score 4-6).     apixaban  (ELIQUIS ) 5 MG TABS tablet Take 1 tablet (5 mg total) by mouth 2 (two) times daily. 180 tablet 1   atorvastatin  (LIPITOR) 40 MG tablet Take 1 tablet (40 mg total) by mouth daily. 90 tablet 1   Cholecalciferol  (VITAMIN D ) 2000 UNITS CAPS Take 2,000 Units by mouth every evening.      furosemide  (LASIX ) 40 MG tablet Take 1 tablet (40 mg total) by mouth daily. 30 tablet 3   levothyroxine  (SYNTHROID ) 100 MCG tablet Take 1 tablet (100 mcg total) by mouth daily. 90 tablet 2   magnesium  oxide (MAG-OX) 400 (240 Mg) MG tablet TAKE 1 TABLET TWICE DAILY . 180 tablet 3   metoprolol  succinate (TOPROL -XL) 50 MG 24 hr tablet Take 1 tablet (50 mg total) by mouth every evening. 30 tablet 5   Multiple Vitamin (MULTIVITAMIN WITH MINERALS) TABS tablet Take 1 tablet by mouth daily.      Multiple Vitamins-Minerals (PRESERVISION AREDS 2) CAPS Take 1 capsule by mouth 2 (  two) times daily.      pantoprazole  (PROTONIX ) 40 MG tablet Take 1 tablet (40 mg total) by mouth daily. 90 tablet 3   potassium chloride  SA (KLOR-CON  M20) 20 MEQ tablet Take 1 tablet (20 mEq total) by mouth daily. 180 tablet 3   sacubitril -valsartan  (ENTRESTO ) 24-26 MG Take 1 tablet by mouth 2 (two) times daily. 180 tablet 1   spironolactone  (ALDACTONE ) 25 MG tablet Take 1 tablet (25 mg total) by mouth daily. 90 tablet 1   tamsulosin  (FLOMAX ) 0.4 MG CAPS capsule Take 1 capsule (0.4 mg total) by mouth daily after supper. 90 capsule 3   venlafaxine  XR (EFFEXOR -XR) 75 MG 24 hr capsule TAKE 1 CAPSULE EVERY DAY 90 capsule 3   zolpidem  (AMBIEN ) 5 MG tablet Take 1 tablet (5 mg total) by mouth at bedtime as needed for sleep. 20 tablet 0    No current facility-administered medications for this visit.    Allergies  Allergen Reactions   Sulfa Antibiotics Other (See Comments)    Unknown/childhood allergy unknown      Social History   Socioeconomic History   Marital status: Married    Spouse name: Scientist, product/process development   Number of children: 2   Years of education: Not on file   Highest education level: Professional school degree (e.g., MD, DDS, DVM, JD)  Occupational History   Occupation: retired, has degree in physiology  Tobacco Use   Smoking status: Former    Current packs/day: 0.00    Types: Cigarettes    Quit date: 12/29/1968    Years since quitting: 55.6   Smokeless tobacco: Never  Vaping Use   Vaping status: Never Used  Substance and Sexual Activity   Alcohol use: Yes    Alcohol/week: 21.0 standard drinks of alcohol    Types: 21 Cans of beer per week   Drug use: No   Sexual activity: Yes  Other Topics Concern   Not on file  Social History Narrative   Pt lives in [place] w/ wife.   Social Drivers of Corporate investment banker Strain: Low Risk  (10/08/2018)   Overall Financial Resource Strain (CARDIA)    Difficulty of Paying Living Expenses: Not hard at all  Food Insecurity: No Food Insecurity (06/18/2024)   Hunger Vital Sign    Worried About Running Out of Food in the Last Year: Never true    Ran Out of Food in the Last Year: Never true  Transportation Needs: No Transportation Needs (06/18/2024)   PRAPARE - Administrator, Civil Service (Medical): No    Lack of Transportation (Non-Medical): No  Physical Activity: Inactive (07/13/2023)   Exercise Vital Sign    Days of Exercise per Week: 0 days    Minutes of Exercise per Session: 0 min  Stress: No Stress Concern Present (07/13/2023)   [employer] of Occupational Health - Occupational Stress Questionnaire    Feeling of Stress : Not at all  Social Connections: Socially Isolated (06/18/2024)   Social Connection and Isolation Panel     Frequency of Communication with Friends and Family: Twice a week    Frequency of Social Gatherings with Friends and Family: Never    Attends Religious Services: Never    Database administrator or Organizations: No    Attends Banker Meetings: Never    Marital Status: Married  Catering manager Violence: Not At Risk (06/18/2024)   Humiliation, Afraid, Rape, and Kick questionnaire    Fear of Current or Ex-Partner: No  Emotionally Abused: No    Physically Abused: No    Sexually Abused: No      Family History  Problem Relation Age of Onset   Breast cancer Mother    Colon cancer Neg Hx    Esophageal cancer Neg Hx    Stomach cancer Neg Hx    Rectal cancer Neg Hx    Vitals:   07/29/24 1528  BP: 95/71  Pulse: 91  SpO2: 98%  Weight: 158 lb (71.7 kg)   Wt Readings from Last 3 Encounters:  07/29/24 158 lb (71.7 kg)  07/26/24 158 lb (71.7 kg)  06/30/24 161 lb 3.2 oz (73.1 kg)   Lab Results  Component Value Date   CREATININE 1.00 07/26/2024   CREATININE 1.23 06/27/2024   CREATININE 0.99 06/23/2024    PHYSICAL EXAM:  General: Elderly. No resp difficulty HEENT: right side of face w/ severe drooping due to facial nerve damage at birth Neck: supple. no JVD. Carotids 2+ bilat; no bruits. No lymphadenopathy or thryomegaly appreciated. Cor: PMI nondisplaced. Regular rate & rhythm. No rubs, gallops or murmurs. Lungs: clear Abdomen: soft, nontender, nondistended. No hepatosplenomegaly. No bruits or masses. Good bowel sounds. Extremities: no cyanosis, clubbing, rash, edema Neuro: alert & orientedx3, cranial nerves grossly intact. moves all 4 extremities w/o difficulty. Affect pleasant  ECG NSR 67 1AVB 230 LBBB 151 ms Personally reviewed  ASSESSMENT & PLAN:  1. Chronic heart failure with now reduced ejection fraction- - onset 6/25 EF 20-25% - RHC 06/21/24: RA 5, PA 43/22 (29), PCWP 20, CO/CI 5.8/2.9, PVR 1.6.  - cMRI 06/22/24 LVEF 11% RV severely decreased No LGE  -  unclear etiology, mixed vs NICM by cath. New LBBB. ? Alcohol induced vs LBBB mediated. HX of spinal stenosis & urinary retention so could consider amyloid although cMRI did not reveal any infiltrative disease - NYHA class III. Volume ok  - continue furosemide  40 mg daily/ potassium 20meq daily (dose recently reduced by [facility] -agree) - BP low.  -> 25 - decreased metoprolol  succinate 50mg  daily - decrease Entresto  24/26 to 12/13 bid - decreased spiro 25 to 12.5 mg daily - recent urinary retention and foley catheter placement so hesitant to use SGLT2 right now - Labs today - Repeat echo 2-3 months. Call me if BP persistently < . If EF remains low will consider CRT-D - Instructed him to walk 5 mins 3x/day and work up to 3x/day. Eventual CR referral  2. HTN- - BP low. Med changes as above  3. PAF- - now in NSR - continue Eliquis   4. LBBB- - ~168ms - may be cause of CM. If EF remains < 35% consider CRT-D  5. ETOH- - he has been quit for over a month. Congratulated him   6. CAD - cath 6/25  - mod to sev diffuse LAD disease up to 70%. Non-obstructive CAD in RCA and LCX -> medical therapy - no angina - off ASA with DOAC. Continue statin per Dr.End - Medical Rx   Toribio Fuel, MD 07/29/24

## 2024-07-29 NOTE — Patient Instructions (Signed)
 Medication Changes:  DECREASE ENTRESTO  TO 12/13 MG TWICE DAILY  DECREASE METOPROLOL  TO 25 MG  DECREASE SPIRONOLACTONE  TO 12.5 MG   Follow-Up in: 1 MONTH WITH DR. CHERRIE   Thank you for choosing [facility].    At the Advanced Heart Failure Clinic, you and your health needs are our priority. We have a designated team specialized in the treatment of Heart Failure. This Care Team includes your primary Heart Failure Specialized Cardiologist (physician), Advanced Practice Providers (APPs- Physician Assistants and Nurse Practitioners), and Pharmacist who all work together to provide you with the care you need, when you need it.   You may see any of the following providers on your designated Care Team at your next follow up:  Dr. Toribio CHERRIE Dr. Ezra Shuck Dr. Ria Commander Dr. Morene Brownie Ellouise Class, FNP Jaun Bash, RPH-CPP  Please be sure to bring in all your medications bottles to every appointment.   Need to Contact Us :  If you have any questions or concerns before your next appointment please send us  a message through [place] or call our office at [phone].    TO LEAVE A MESSAGE FOR THE NURSE SELECT OPTION 2, PLEASE LEAVE A MESSAGE INCLUDING: YOUR NAME DATE OF BIRTH CALL BACK NUMBER REASON FOR CALL**this is important as we prioritize the call backs  YOU WILL RECEIVE A CALL BACK THE SAME DAY AS LONG AS YOU CALL BEFORE 4:00 PM

## 2024-07-30 ENCOUNTER — Telehealth: Payer: Self-pay | Admitting: Student

## 2024-07-30 ENCOUNTER — Other Ambulatory Visit: Payer: Self-pay | Admitting: Student

## 2024-07-30 MED ORDER — METOPROLOL SUCCINATE ER 25 MG PO TB24: 25.0000 mg | ORAL_TABLET | Freq: Every day | ORAL | 3 refills | Status: DC

## 2024-07-30 NOTE — Telephone Encounter (Signed)
    Received a call from the patient's pharmacy that the patient's wife was saying his dose of Toprol -XL was incorrect. Was sent in as Toprol -XL 12.5mg  daily and the wife said he was supposed to be on 25mg  daily (reduced from 50mg  daily). Office note is incomplete at this time. By review of the AVS, it says to reduce to Metoprolol  to 25mg . Given the AVS information, will send in as 25mg  daily. Will route to Dr. Bensimhon to verify since note is incomplete at this time and want to ensure he is on the correct dose.   Signed, Laymon CHRISTELLA Qua, PA-C 07/30/2024, 4:18 PM Pager: [phone]

## 2024-08-03 ENCOUNTER — Ambulatory Visit: Payer: Medicare (Managed Care) | Admitting: Physician Assistant

## 2024-08-03 ENCOUNTER — Encounter: Payer: Self-pay | Admitting: Urology

## 2024-08-03 ENCOUNTER — Ambulatory Visit: Payer: Medicare (Managed Care) | Admitting: Urology

## 2024-08-03 VITALS — BP 102/64 | Ht 71.0 in | Wt 156.0 lb

## 2024-08-03 DIAGNOSIS — R319 Hematuria, unspecified: Secondary | ICD-10-CM | POA: Diagnosis not present

## 2024-08-03 DIAGNOSIS — N401 Enlarged prostate with lower urinary tract symptoms: Secondary | ICD-10-CM | POA: Diagnosis not present

## 2024-08-03 DIAGNOSIS — R3914 Feeling of incomplete bladder emptying: Secondary | ICD-10-CM | POA: Diagnosis not present

## 2024-08-03 MED ORDER — LIDOCAINE HCL URETHRAL/MUCOSAL 2 % EX GEL
1.0000 | Freq: Once | CUTANEOUS | Status: AC
  Administered 2024-08-03: 1 via URETHRAL

## 2024-08-03 MED ORDER — CEPHALEXIN 250 MG PO CAPS
500.0000 mg | ORAL_CAPSULE | Freq: Once | ORAL | Status: AC
  Administered 2024-08-03: 500 mg via ORAL

## 2024-08-03 NOTE — Progress Notes (Signed)
 Cystoscopy Procedure Note:  Indication: Incomplete bladder emptying  Keflex  given for prophylaxis  After informed consent and discussion of the procedure and its risks, Eugene White was positioned and prepped in the standard fashion. Cystoscopy was performed with a flexible cystoscope. The urethra, bladder neck and entire bladder was visualized in a standard fashion. The prostate was moderate in size. The ureteral orifices were visualized in their normal location and orientation.  Distended bladder, no suspicious lesions, no abnormalities on retroflexion, mild debris at the base of the bladder  Imaging: Renal ultrasound 07/08/2024 with mild right renal dilation, chronic severe left hydronephrosis, distended trabeculated bladder wall with PVR .  Findings: Moderate size prostate, distended bladder, no suspicious lesions  ------------------------------------------------------------------------------------   Assessment and Plan: 78 year old extremely complex male who has been followed by multiple prior urologist, today is my first day meeting him.  He has a history of severe left hydronephrosis down to the mid left ureter of unclear etiology that was evaluated by Dr. Penne with diagnostic ureteroscopy and no evidence of malignancy was found.  Renal scan showed only 26% function of the left kidney, he did not tolerate stent well, and opted for no further investigation or treatment.  He also has incomplete bladder emptying with PVRs ranging from 300-539ml, with intermittent right-sided hydronephrosis.  Fortunately renal function has remained normal.  He is minimally symptomatic from a voiding perspective.  He is fairly adamantly opposed to any type of intervention like HOLEP to improve his emptying and protect his renal function.  He prefers more of an observation approach with his age and other medical problems.  Risks were discussed extensively including UTIs, retention, renal failure. PSA  4.5 from June 2025 consistent with BPH, low suspicion for prostate cancer.  I also contacted his daughter Eugene White who is a Manufacturing engineer and updated her on the above, she is in agreement with plan for observation and understands risks.  RTC 3 to 4 months PVR and symptom check Would again recommend HOLEP in the future if retention or hydronephrosis with worsening renal function    Redell Burnet, MD 08/03/2024

## 2024-08-03 NOTE — Patient Instructions (Signed)

## 2024-08-26 ENCOUNTER — Other Ambulatory Visit: Payer: Self-pay | Admitting: Family Medicine

## 2024-08-26 DIAGNOSIS — E039 Hypothyroidism, unspecified: Secondary | ICD-10-CM

## 2024-08-30 ENCOUNTER — Encounter: Payer: Medicare (Managed Care) | Admitting: Cardiology

## 2024-09-01 ENCOUNTER — Telehealth: Payer: Self-pay | Admitting: Cardiology

## 2024-09-01 NOTE — Telephone Encounter (Signed)
 Called to confirm/remind patient of their appointment at the Advanced Heart Failure Clinic on 09/02/24.   Appointment:   [x] Confirmed  [] Left mess   [] No answer/No voice mail  [] VM Full/unable to leave message  [] Phone not in service  Patient reminded to bring all medications and/or complete list.  Confirmed patient has transportation. Gave directions, instructed to utilize valet parking.

## 2024-09-02 ENCOUNTER — Other Ambulatory Visit (HOSPITAL_COMMUNITY): Payer: Self-pay | Admitting: Cardiology

## 2024-09-02 ENCOUNTER — Encounter: Payer: Self-pay | Admitting: Cardiology

## 2024-09-02 ENCOUNTER — Ambulatory Visit: Payer: Medicare (Managed Care) | Attending: Cardiology | Admitting: Cardiology

## 2024-09-02 ENCOUNTER — Inpatient Hospital Stay (HOSPITAL_COMMUNITY)
Admission: RE | Admit: 2024-09-02 | Discharge: 2024-09-02 | Disposition: A | Discharge status: Home / Self Care | Payer: Medicare (Managed Care) | Source: Ambulatory Visit | Attending: Cardiology | Admitting: Cardiology

## 2024-09-02 VITALS — BP 97/66 | HR 84 | Wt 167.6 lb

## 2024-09-02 DIAGNOSIS — R002 Palpitations: Secondary | ICD-10-CM

## 2024-09-02 DIAGNOSIS — I5022 Chronic systolic (congestive) heart failure: Secondary | ICD-10-CM | POA: Diagnosis not present

## 2024-09-02 DIAGNOSIS — I2510 Atherosclerotic heart disease of native coronary artery without angina pectoris: Secondary | ICD-10-CM | POA: Diagnosis not present

## 2024-09-02 DIAGNOSIS — Z79899 Other long term (current) drug therapy: Secondary | ICD-10-CM | POA: Diagnosis not present

## 2024-09-02 DIAGNOSIS — I480 Paroxysmal atrial fibrillation: Secondary | ICD-10-CM | POA: Diagnosis not present

## 2024-09-02 DIAGNOSIS — Z7901 Long term (current) use of anticoagulants: Secondary | ICD-10-CM | POA: Insufficient documentation

## 2024-09-02 DIAGNOSIS — I110 Hypertensive heart disease with heart failure: Secondary | ICD-10-CM | POA: Diagnosis not present

## 2024-09-02 DIAGNOSIS — I447 Left bundle-branch block, unspecified: Secondary | ICD-10-CM | POA: Diagnosis not present

## 2024-09-02 NOTE — Progress Notes (Signed)
 ADVANCED HEART FAILURE FOLLOW UP CLINIC NOTE  Referring Physician: Myrla Jon HERO, MD  Primary Care: Myrla Jon HERO, MD Primary Cardiologist:  HPI: Eugene White is a 78 y.o. male who presents for follow up of chronic systolic heart failure.      Prior history of atrial fibrillation diagnosed in 2018 during surgery.  Underwent successful cardioversion around that time.  He went on flecainide  for a few years with good rhythm control.  Normal echocardiogram in 2022.  Began developing symptoms in 2025.  Admitted 06/17/24 with shortness of breath and 20 lb weight gain over several months. BNP  >4500, HS-troponin 34. ECG new LBBB. Echo EF 20-25%, mild LVH, moderately reduced RV function, mod-sev MR.  RHC 06/21/24: RA 5, PA 43/22 (29), PCWP 20, CO/CI 5.8/2.9, PVR 1.6. cMRI 06/22/24 LVEF 11% RV severely decreased No LGE Coronary angio with mod to sev diffuse LAD disease up to 70%. Non-obstructive CAD in RCA and LCX -> medical therapy      SUBJECTIVE:  Patient overall reports that he is feeling better since his last visit.  He feels that he has more energy and is able to go shopping and do basic things that he had difficulty with previously.  He reports having no issues with his medications currently.  We discussed that he could likely cut down on his Lasix  given that he appears euvolemic, but was somewhat difficult to redirect.  We discussed his left bundle branch block and that if his ejection fraction is still reduced at follow-up would refer for consideration of CRT-P/D.  He also reports having frequent palpitations that he describes as PVCs, does not feel that he has had recurrent A-fib.  PMH, current medications, allergies, social history, and family history reviewed in epic.  PHYSICAL EXAM: Vitals:   09/02/24 1535  BP: 97/66  Pulse: 84  SpO2: 99%   GENERAL: Chronically ill-appearing PULM:  Normal work of breathing, clear to auscultation bilaterally. Respirations are  unlabored.  CARDIAC:  JVP: Flat         Normal rate with regular rhythm. No murmurs, rubs or gallops.  No edema. Warm and well perfused extremities. ABDOMEN: Soft, non-tender, non-distended. NEUROLOGIC: Patient is oriented x3 with no focal or lateralizing neurologic deficits.    DATA REVIEW  ECG: 09/03/2024: Normal sinus rhythm, left bundle branch block 144 ms  ECHO: 06/19/2024: LVEF 20 to 25%, RV function moderately reduced, moderate biatrial dilation, moderate AR, no AS  CATH: 05/2024: Single-vessel CAD with diffusely diseased LAD, otherwise mild to moderate disease.  PA 43/22 (29), RA 5, PCWP 20, Fick CO/CI 5.8/2.9  CMR: LVEF 11%, normal RV size and thickness, severely reduced RV systolic function, no significant LGE or scar   ASSESSMENT & PLAN:  Chronic systolic heart failure: Largely nonischemic, though had some moderate disease in catheterization.  Cardiac MRI without evidence of infarct or LGE.  Does have wide left bundle branch block.  Wild-type amyloid also consideration but his MRI was largely unremarkable for infiltrative disease. - Repeat echocardiogram ordered - If still reduced we will refer to electrophysiology for CRT - Patient unsure whether he would want defibrillator at that time, reasonable given advanced age.  Does not have significant LGE so could consider - Zio patch placed to evaluate PVC burden, may need to treat especially if he is planning to undergo CRT - Continue metoprolol  succinate 50 mg daily - Continue furosemide  40 mg daily, discussed that he could transition to as needed dosing - Continue Entresto  one  half tab 24/26 mg twice daily - Continue spironolactone  12.5 mg daily - BP improved  Hypertension: - Improved after recent changes  pAF:  - Continue Eliquis  mbg BID, in NSR  ETOH- - he has been quit for over a month. Congratulated him    CAD - cath 6/25  - mod to sev diffuse LAD disease up to 70%. Non-obstructive CAD in RCA and LCX -> medical  therapy - no angina - off ASA with DOAC. Continue statin - Medical Rx    Follow up in 2 months  Morene Brownie, MD Advanced Heart Failure Mechanical Circulatory Support 09/03/24

## 2024-09-02 NOTE — Progress Notes (Signed)
 Zio patch placed onto patient.  All instructions and information reviewed with patient, they verbalize understanding with no questions.

## 2024-09-02 NOTE — Patient Instructions (Addendum)
 Medication Changes:  No medication changes today!   Testing/Procedures:  Your provider has recommended that  you wear a Zio Patch for 14 days.  This monitor will record your heart rhythm for our review.  IF you have any symptoms while wearing the monitor please press the button.  If you have any issues with the patch or you notice a red or orange light on it please call the company at [phone].  Once you remove the patch please mail it back to the company as soon as possible so we can get the results.   Follow-Up in: Please follow up with the Advanced Heart Failure Clinic in 3 months with Dr. Zenaida. We do not currently have that schedule. Please give us  a call in November in order to schedule your appointment for December.    Thank you for choosing [facility].    At the Advanced Heart Failure Clinic, you and your health needs are our priority. We have a designated team specialized in the treatment of Heart Failure. This Care Team includes your primary Heart Failure Specialized Cardiologist (physician), Advanced Practice Providers (APPs- Physician Assistants and Nurse Practitioners), and Pharmacist who all work together to provide you with the care you need, when you need it.   You may see any of the following providers on your designated Care Team at your next follow up:  Dr. Toribio Fuel Dr. Ezra Shuck Dr. Ria Commander Dr. Morene Zenaida Ellouise Class, FNP Jaun Bash, RPH-CPP  Please be sure to bring in all your medications bottles to every appointment.   Need to Contact Us :  If you have any questions or concerns before your next appointment please send us  a message through [place] or call our office at [phone].    TO LEAVE A MESSAGE FOR THE NURSE SELECT OPTION 2, PLEASE LEAVE A MESSAGE INCLUDING: YOUR NAME DATE OF BIRTH CALL BACK NUMBER REASON FOR CALL**this is important as we prioritize the call backs  YOU WILL  RECEIVE A CALL BACK THE SAME DAY AS LONG AS YOU CALL BEFORE 4:00 PM

## 2024-09-06 DIAGNOSIS — H2513 Age-related nuclear cataract, bilateral: Secondary | ICD-10-CM | POA: Diagnosis not present

## 2024-09-06 DIAGNOSIS — H353131 Nonexudative age-related macular degeneration, bilateral, early dry stage: Secondary | ICD-10-CM | POA: Diagnosis not present

## 2024-09-28 ENCOUNTER — Ambulatory Visit: Payer: Medicare (Managed Care) | Attending: Internal Medicine | Admitting: Internal Medicine

## 2024-09-28 ENCOUNTER — Encounter: Payer: Self-pay | Admitting: Internal Medicine

## 2024-09-28 VITALS — BP 102/60 | HR 89 | Wt 171.0 lb

## 2024-09-28 DIAGNOSIS — E785 Hyperlipidemia, unspecified: Secondary | ICD-10-CM | POA: Diagnosis not present

## 2024-09-28 DIAGNOSIS — I2510 Atherosclerotic heart disease of native coronary artery without angina pectoris: Secondary | ICD-10-CM | POA: Diagnosis not present

## 2024-09-28 DIAGNOSIS — I5022 Chronic systolic (congestive) heart failure: Secondary | ICD-10-CM | POA: Diagnosis not present

## 2024-09-28 DIAGNOSIS — R002 Palpitations: Secondary | ICD-10-CM

## 2024-09-28 DIAGNOSIS — I480 Paroxysmal atrial fibrillation: Secondary | ICD-10-CM | POA: Diagnosis not present

## 2024-09-28 DIAGNOSIS — Z79899 Other long term (current) drug therapy: Secondary | ICD-10-CM | POA: Diagnosis not present

## 2024-09-28 DIAGNOSIS — I4819 Other persistent atrial fibrillation: Secondary | ICD-10-CM | POA: Diagnosis not present

## 2024-09-28 NOTE — Progress Notes (Unsigned)
  Cardiology Office Note:  .   Date:  09/28/2024  ID:  Eugene White, DOB January 27, 1946, MRN [id number] PCP: Myrla Jon HERO, MD  [facility] Providers Cardiologist:  Stanly DELENA Leavens, MD { Click to update primary MD,subspecialty MD or APP then REFRESH:1}    History of Present Illness: .   Eugene White is a 78 y.o. 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  He was previously followed in [place] by Dr. Leavens but wishes to transition his care to our office.  He was last seen in our office in July by Bernardino Bring, PA, at which time he denied chest pain but continued to note exertional shortness of breath with minimal activity.  He was seen by Dr. Zenaida in the heart failure clinic last month, at which time no medication changes were made.  Zio patch was placed to assess PVC burden.  Repeat echocardiogram was also ordered with plans for EP consultation for biventricular pacing if LVEF remains significantly reduced.  Feeling better.  Not having extreme fatigue anymore.  Can push cart around grocery store and feel fine.  If exerts for extended period, notices some palpitations when first sits down.  No chest pain.  No swelling.  Felt dehydrated when on high doses of Lasix .  No LH.  No bleeding.  Remains on Eliquis .  Left posterior cervical LAD.  ROS: See HPI  Studies Reviewed: SABRA   EKG Interpretation Date/Time:  Wednesday September 28 2024 15:24:12 EDT Ventricular Rate:  89 PR Interval:  196 QRS Duration:  144 QT Interval:  418 QTC Calculation: 508 R Axis:   -29  Text Interpretation: Sinus  rhythm with occasional Premature ventricular complexes and Fusion complexes Left bundle branch block Abnormal ECG When compared with ECG of 02-Sep-2024 15:47, Fusion complexes are now Present Premature ventricular complexes are now Present Confirmed by Lauralee Waters, Lonni [phone]) on 09/28/2024 3:28:55 PM    *** Risk Assessment/Calculations:   {Does this patient have ATRIAL FIBRILLATION?[phone]}         Physical Exam:   VS:  BP 102/60 (BP Location: Left Arm, Patient Position: Sitting, Cuff Size: Normal)   Pulse 89   Wt 171 lb (77.6 kg)   SpO2 97%   BMI 23.85 kg/m    Wt Readings from Last 3 Encounters:  09/28/24 171 lb (77.6 kg)  09/02/24 167 lb 9.6 oz (76 kg)  08/03/24 156 lb (70.8 kg)    General:  NAD. Neck: No JVD or HJR. Lungs: Clear to auscultation bilaterally without wheezes or crackles. Heart: Regular rate and rhythm without murmurs, rubs, or gallops. Abdomen: Soft, nontender, nondistended. Extremities: No lower extremity edema.  ASSESSMENT AND PLAN: .    ***    {Are you ordering a CV Procedure (e.g. stress test, cath, DCCV, TEE, etc)?   Press F2        :[id number]}  Dispo: ***  Signed, Lonni Hanson, MD

## 2024-09-28 NOTE — Patient Instructions (Signed)
 Medication Instructions:  Your physician recommends that you continue on your current medications as directed. Please refer to the Current Medication list given to you today.    *If you need a refill on your cardiac medications before your next appointment, please call your pharmacy*  Lab Work: Your provider would like for you to have following labs drawn today BMP.     Testing/Procedures: No test ordered today   Follow-Up: At [facility], you and your health needs are our priority.  As part of our continuing mission to provide you with exceptional heart care, our providers are all part of one team.  This team includes your primary Cardiologist (physician) and Advanced Practice Providers or APPs (Physician Assistants and Nurse Practitioners) who all work together to provide you with the care you need, when you need it.  Your next appointment:   3 month(s)  Provider:   You may see Lonni Hanson, MD or one of the following Advanced Practice Providers on your designated Care Team:   Lonni Meager, NP Lesley Maffucci, PA-C Bernardino Bring, PA-C Cadence [place], PA-C Tylene Lunch, NP Barnie Hila, NP

## 2024-09-29 ENCOUNTER — Encounter: Payer: Self-pay | Admitting: Internal Medicine

## 2024-09-29 ENCOUNTER — Ambulatory Visit: Payer: Self-pay | Admitting: Internal Medicine

## 2024-09-29 DIAGNOSIS — I5022 Chronic systolic (congestive) heart failure: Secondary | ICD-10-CM | POA: Insufficient documentation

## 2024-09-29 DIAGNOSIS — I2510 Atherosclerotic heart disease of native coronary artery without angina pectoris: Secondary | ICD-10-CM | POA: Insufficient documentation

## 2024-09-29 DIAGNOSIS — I4819 Other persistent atrial fibrillation: Secondary | ICD-10-CM | POA: Insufficient documentation

## 2024-09-29 LAB — BASIC METABOLIC PANEL WITH GFR
BUN/Creatinine Ratio: 30 — ABNORMAL HIGH (ref 10–24)
BUN: 35 mg/dL — ABNORMAL HIGH (ref 8–27)
CO2: 23 mmol/L (ref 20–29)
Calcium: 9.7 mg/dL (ref 8.6–10.2)
Chloride: 100 mmol/L (ref 96–106)
Creatinine, Ser: 1.18 mg/dL (ref 0.76–1.27)
Glucose: 87 mg/dL (ref 70–99)
Potassium: 4.3 mmol/L (ref 3.5–5.2)
Sodium: 139 mmol/L (ref 134–144)
eGFR: 63 mL/min/1.73 (ref >59)

## 2024-10-11 ENCOUNTER — Encounter: Payer: Self-pay | Admitting: Family Medicine

## 2024-10-11 ENCOUNTER — Ambulatory Visit: Payer: Medicare (Managed Care) | Admitting: Family Medicine

## 2024-10-11 VITALS — BP 93/64 | HR 88 | Ht 71.0 in | Wt 174.7 lb

## 2024-10-11 DIAGNOSIS — Z0000 Encounter for general adult medical examination without abnormal findings: Secondary | ICD-10-CM

## 2024-10-11 DIAGNOSIS — R3914 Feeling of incomplete bladder emptying: Secondary | ICD-10-CM | POA: Diagnosis not present

## 2024-10-11 DIAGNOSIS — R591 Generalized enlarged lymph nodes: Secondary | ICD-10-CM | POA: Insufficient documentation

## 2024-10-11 DIAGNOSIS — E785 Hyperlipidemia, unspecified: Secondary | ICD-10-CM | POA: Diagnosis not present

## 2024-10-11 DIAGNOSIS — I10 Essential (primary) hypertension: Secondary | ICD-10-CM | POA: Diagnosis not present

## 2024-10-11 DIAGNOSIS — I480 Paroxysmal atrial fibrillation: Secondary | ICD-10-CM

## 2024-10-11 DIAGNOSIS — I2510 Atherosclerotic heart disease of native coronary artery without angina pectoris: Secondary | ICD-10-CM | POA: Diagnosis not present

## 2024-10-11 DIAGNOSIS — E039 Hypothyroidism, unspecified: Secondary | ICD-10-CM | POA: Diagnosis not present

## 2024-10-11 DIAGNOSIS — Z23 Encounter for immunization: Secondary | ICD-10-CM

## 2024-10-11 DIAGNOSIS — N401 Enlarged prostate with lower urinary tract symptoms: Secondary | ICD-10-CM

## 2024-10-11 DIAGNOSIS — I5022 Chronic systolic (congestive) heart failure: Secondary | ICD-10-CM

## 2024-10-11 DIAGNOSIS — F324 Major depressive disorder, single episode, in partial remission: Secondary | ICD-10-CM | POA: Diagnosis not present

## 2024-10-11 MED ORDER — TAMSULOSIN HCL 0.4 MG PO CAPS: 0.4000 mg | ORAL_CAPSULE | Freq: Every day | ORAL | 3 refills | Status: AC

## 2024-10-11 MED ORDER — METOPROLOL SUCCINATE ER 25 MG PO TB24: 25.0000 mg | ORAL_TABLET | Freq: Every day | ORAL | 3 refills | Status: AC

## 2024-10-11 NOTE — Progress Notes (Signed)
 Complete physical exam   Patient: Eugene White   DOB: 03/26/1946   78 y.o. Male  MRN: [id number] Visit Date: 10/11/2024  Today's healthcare provider: Jon Eva, MD   Chief Complaint  Patient presents with   Annual Exam    Last completed 07/13/23, AWV scheduled 10/26/24 Diet -  attempt to restrict sodium Exercise - walk 4-5 times a week typically in a store where he can hold onto something  Feeling - well Sleeping - fairly well due to arthritis Concerns -  enlarged lymph nodes on left side of neck   Subjective    Eugene White is a 78 y.o. male who presents today for a complete physical exam.    Discussed the use of AI scribe software for clinical note transcription with the patient, who gave verbal consent to proceed.  History of Present Illness   Eugene White is a 78 year old male who presents for an annual physical exam.  He has three enlarged lymph nodes of unspecified duration. No sore throat, ear pain, or recent infections. A cardiologist evaluated the lymph nodes and found them not significantly enlarged.  He manages hypertension, heart failure with reduced ejection fraction, paroxysmal atrial fibrillation, coronary artery disease, hypothyroidism, hyperlipidemia, and depression with medications including Eliquis , atorvastatin , Lasix , Synthroid , metoprolol  XL, potassium, Entresto , spironolactone , Effexor , and Ambien . His weight is stable, and his appetite has improved, now eating lunch regularly. He previously experienced significant weight gain due to fluid retention, requiring hospitalization and treatment with IV Lasix , resulting in a 45-pound weight loss.  He experiences difficulty with complete bladder voiding, with residual urine noted by urology, and frequent urination with incomplete bladder emptying.  He has a persistently low red blood cell count, normal white blood cell count, and high blood urea nitrogen level attributed to dehydration from  fluid intake restrictions. He follows a low sodium diet, avoiding canned foods and restaurant meals.  He does not work in public or frequently interact with large groups, but he goes to the grocery store and walks the aisles for exercise.        Last depression screening scores    06/28/2024    2:19 PM 06/10/2024    2:55 PM 04/07/2024    2:13 PM  PHQ 2/9 Scores  PHQ - 2 Score 0 2 0  PHQ- 9 Score 6 11 5    Last fall risk screening    04/07/2024    2:13 PM  Fall Risk   Falls in the past year? 1  Number falls in past yr: 1  Injury with Fall? 1  Risk for fall due to : History of fall(s)  Follow up Falls evaluation completed        Medications: Outpatient Medications Prior to Visit  Medication Sig   acetaminophen  (TYLENOL ) 500 MG tablet Take 1,500 mg by mouth every 6 (six) hours as needed for moderate pain (pain score 4-6).   apixaban  (ELIQUIS ) 5 MG TABS tablet Take 1 tablet (5 mg total) by mouth 2 (two) times daily.   atorvastatin  (LIPITOR) 40 MG tablet Take 1 tablet (40 mg total) by mouth daily.   Cholecalciferol  (VITAMIN D ) 2000 UNITS CAPS Take 2,000 Units by mouth every evening.    furosemide  (LASIX ) 40 MG tablet Take 1 tablet (40 mg total) by mouth daily.   gentamicin ointment (GARAMYCIN) 0.1 % Apply topically.   levothyroxine  (SYNTHROID ) 100 MCG tablet TAKE 1 TABLET DAILY   magnesium  oxide (MAG-OX) 400 (240 Mg) MG  tablet TAKE 1 TABLET TWICE DAILY .   Multiple Vitamin (MULTIVITAMIN WITH MINERALS) TABS tablet Take 1 tablet by mouth daily.    Multiple Vitamins-Minerals (PRESERVISION AREDS 2) CAPS Take 1 capsule by mouth 2 (two) times daily.    pantoprazole  (PROTONIX ) 40 MG tablet Take 1 tablet (40 mg total) by mouth daily.   potassium chloride  SA (KLOR-CON  M20) 20 MEQ tablet Take 1 tablet (20 mEq total) by mouth daily.   sacubitril -valsartan  (ENTRESTO ) 24-26 MG TAKE ENTRESTO  12-13 MG TWICE DAILY (1/2 TABLET BID)   spironolactone  (ALDACTONE ) 25 MG tablet Take 0.5 tablets (12.5  mg total) by mouth daily.   venlafaxine  XR (EFFEXOR -XR) 75 MG 24 hr capsule TAKE 1 CAPSULE EVERY DAY   zolpidem  (AMBIEN ) 5 MG tablet Take 1 tablet (5 mg total) by mouth at bedtime as needed for sleep.   [DISCONTINUED] metoprolol  succinate (TOPROL -XL) 25 MG 24 hr tablet Take 1 tablet (25 mg total) by mouth daily. Take with or immediately following a meal.   [DISCONTINUED] tamsulosin  (FLOMAX ) 0.4 MG CAPS capsule Take 1 capsule (0.4 mg total) by mouth daily after supper.   No facility-administered medications prior to visit.    Review of Systems    Objective    BP 93/64 (BP Location: Left Arm, Patient Position: Sitting, Cuff Size: Normal)   Pulse 88   Ht 5' 11 (1.803 m)   Wt 174 lb 11.2 oz (79.2 kg)   SpO2 99%   BMI 24.37 kg/m    Physical Exam Vitals reviewed.  Constitutional:      General: He is not in acute distress.    Appearance: Normal appearance. He is well-developed. He is not diaphoretic.  HENT:     Head: Normocephalic and atraumatic.     Right Ear: Tympanic membrane, ear canal and external ear normal.     Left Ear: Tympanic membrane, ear canal and external ear normal.     Nose: Nose normal.     Mouth/Throat:     Mouth: Mucous membranes are moist.     Pharynx: Oropharynx is clear. No oropharyngeal exudate.  Eyes:     General: No scleral icterus.    Conjunctiva/sclera: Conjunctivae normal.     Pupils: Pupils are equal, round, and reactive to light.  Neck:     Thyroid : No thyromegaly.  Cardiovascular:     Rate and Rhythm: Normal rate and regular rhythm.     Heart sounds: Normal heart sounds. No murmur heard. Pulmonary:     Effort: Pulmonary effort is normal. No respiratory distress.     Breath sounds: Normal breath sounds. No wheezing or rales.  Abdominal:     General: There is no distension.     Palpations: Abdomen is soft.     Tenderness: There is no abdominal tenderness.  Musculoskeletal:        General: No deformity.     Cervical back: Neck supple.      Right lower leg: No edema.     Left lower leg: No edema.  Lymphadenopathy:     Cervical: Cervical adenopathy (3 slightly enlarged, mobile, non-tender cervical LNs posterior to SCM) present.  Skin:    General: Skin is warm and dry.     Findings: No rash.  Neurological:     Mental Status: He is alert and oriented to person, place, and time. Mental status is at baseline.     Gait: Gait normal.  Psychiatric:        Mood and Affect: Mood normal.  Behavior: Behavior normal.        Thought Content: Thought content normal.      No results found for any visits on 10/11/24.  Assessment & Plan    Routine Health Maintenance and Physical Exam  Exercise Activities and Dietary recommendations  Goals      Exercise 3x per week (30 min per time)     Recommend to exercise for 3 days a week for at least 30 minutes at a time.      Increase water  intake     Recommend to continue drinking 6-8 glasses of water  a day.      RNCM: Health and wellness     Care Coordination Interventions: Evaluation of current treatment plan related to procedure the patient had today with dermatologist to remove an area off of his nose and chronic conditions and patient's adherence to plan as established by provider Advised patient to call the [facility] for questions, concerns, new needs. The patient states his wife is a retired Engineer, civil (consulting) and she takes very good care of him. He denies any needs or concerns. Education provided on the goals of the program and to call for any new needs.  Reviewed scheduled/upcoming provider appointments including 09-26-2022 at 220 pm Discussed plans with patient for ongoing care management follow up and provided patient with direct contact information for care management team Advised patient to discuss changes in chronic conditions, questions or concerns with provider Assessed social determinant of health barriers           Immunization History  Administered Date(s) Administered   Fluad  Quad(high Dose 65+) 10/11/2019, 10/01/2022   INFLUENZA, HIGH DOSE SEASONAL PF 11/01/2015, 09/15/2017, 10/11/2024   Influenza-Unspecified 09/28/2013, 10/07/2018, 11/05/2021   PFIZER(Purple Top)SARS-COV-2 Vaccination 02/04/2020, 02/28/2020, 10/18/2020   Pneumococcal Conjugate-13 09/15/2017   Pneumococcal Polysaccharide-23 01/07/2013   Pneumococcal-Unspecified 04/28/2018   Tdap 10/28/2023   Zoster, Live 01/07/2013    Health Maintenance  Topic Date Due   Hepatitis C Screening  Never done   Zoster Vaccines- Shingrix (1 of 2) 08/25/1964   Medicare Annual Wellness (AWV)  07/12/2024   COVID-19 Vaccine (4 - 2025-26 season) 08/29/2024   DTaP/Tdap/Td (2 - Td or Tdap) 10/27/2033   Pneumococcal Vaccine: 50+ Years  Completed   Influenza Vaccine  Completed   Meningococcal B Vaccine  Aged Out   Colonoscopy  Discontinued    Discussed health benefits of physical activity, and encouraged him to engage in regular exercise appropriate for his age and condition.  Problem List Items Addressed This Visit       Cardiovascular and Mediastinum   Essential (primary) hypertension   Well-managed with current medication regimen. - Continue current antihypertensive regimen      Relevant Medications   metoprolol  succinate (TOPROL -XL) 25 MG 24 hr tablet   Paroxysmal atrial fibrillation (HCC)   No recent episodes since starting potassium and magnesium  supplements. Current regimen includes Eliquis  and metoprolol . Discussed switching to magnesium  glycinate to avoid gastrointestinal side effects. - Continue Eliquis  5 mg BID - Continue metoprolol  XL 25 mg daily - Switch magnesium  oxide to magnesium  glycinate      Relevant Medications   metoprolol  succinate (TOPROL -XL) 25 MG 24 hr tablet   Chronic HFrEF (heart failure with reduced ejection fraction) (HCC)   Recent hospitalization for fluid overload with significant weight loss. Currently no fluid retention. Continues on diuretics and heart failure  medications. - Continue Lasix  40 mg daily - Continue Entresto  and spironolactone  12.5 mg daily - Monitor weight and fluid  status - Contact heart failure clinic if significant weight gain occurs      Relevant Medications   metoprolol  succinate (TOPROL -XL) 25 MG 24 hr tablet   Coronary artery disease involving native coronary artery of native heart without angina pectoris   Continues on atorvastatin  for lipid management. - Continue atorvastatin  40 mg daily      Relevant Medications   metoprolol  succinate (TOPROL -XL) 25 MG 24 hr tablet     Endocrine   Hypothyroidism   Well-controlled with current Synthroid  dosage. Recent thyroid  function tests within normal limits. - Continue Synthroid  100 mcg daily      Relevant Medications   metoprolol  succinate (TOPROL -XL) 25 MG 24 hr tablet     Immune and Lymphatic   Lymphadenopathy   Possibly related to past trauma and sleeping position. No signs of infection or systemic illness. Lymph nodes stable in size. - Monitor lymph node size - Consider ultrasound if lymph nodes increase in size        Other   Major depressive disorder with single episode   Managed with Effexor . - Continue Effexor  75 mg daily      Hyperlipidemia LDL goal <70   Managed with atorvastatin . Recent cholesterol levels satisfactory. - Continue atorvastatin  40 mg daily      Relevant Medications   metoprolol  succinate (TOPROL -XL) 25 MG 24 hr tablet   Other Visit Diagnoses       Encounter for annual physical exam    -  Primary     Immunization due       Relevant Orders   Flu vaccine HIGH DOSE PF(Fluzone Trivalent) (Completed)     Benign prostatic hyperplasia with incomplete bladder emptying       Relevant Medications   tamsulosin  (FLOMAX ) 0.4 MG CAPS capsule           Difficulty with bladder emptying Residual urine noted by urology. Continues to be monitored by urology. - Continue monitoring by urology  Adult Wellness Visit Annual physical  examination with no acute issues. Discussed general health maintenance and screenings. - Continue current medications - Monitor health status        Return in about 6 months (around 04/11/2025) for chronic disease f/u.     Jon Eva, MD  [facility] [phone] (phone) [phone] (fax)  [facility]

## 2024-10-11 NOTE — Assessment & Plan Note (Signed)
 Recent hospitalization for fluid overload with significant weight loss. Currently no fluid retention. Continues on diuretics and heart failure medications. - Continue Lasix  40 mg daily - Continue Entresto  and spironolactone  12.5 mg daily - Monitor weight and fluid status - Contact heart failure clinic if significant weight gain occurs

## 2024-10-11 NOTE — Assessment & Plan Note (Signed)
 Managed with Effexor . - Continue Effexor  75 mg daily

## 2024-10-11 NOTE — Assessment & Plan Note (Signed)
 Continues on atorvastatin  for lipid management. - Continue atorvastatin  40 mg daily

## 2024-10-11 NOTE — Assessment & Plan Note (Signed)
 Managed with atorvastatin . Recent cholesterol levels satisfactory. - Continue atorvastatin  40 mg daily

## 2024-10-11 NOTE — Assessment & Plan Note (Signed)
 Possibly related to past trauma and sleeping position. No signs of infection or systemic illness. Lymph nodes stable in size. - Monitor lymph node size - Consider ultrasound if lymph nodes increase in size

## 2024-10-11 NOTE — Assessment & Plan Note (Signed)
 No recent episodes since starting potassium and magnesium  supplements. Current regimen includes Eliquis  and metoprolol . Discussed switching to magnesium  glycinate to avoid gastrointestinal side effects. - Continue Eliquis  5 mg BID - Continue metoprolol  XL 25 mg daily - Switch magnesium  oxide to magnesium  glycinate

## 2024-10-11 NOTE — Assessment & Plan Note (Signed)
 Well-managed with current medication regimen. - Continue current antihypertensive regimen

## 2024-10-11 NOTE — Assessment & Plan Note (Signed)
 Well-controlled with current Synthroid  dosage. Recent thyroid  function tests within normal limits. - Continue Synthroid  100 mcg daily

## 2024-10-19 ENCOUNTER — Ambulatory Visit: Payer: Medicare (Managed Care) | Attending: Cardiology

## 2024-10-19 DIAGNOSIS — I480 Paroxysmal atrial fibrillation: Secondary | ICD-10-CM | POA: Diagnosis not present

## 2024-10-19 DIAGNOSIS — I5022 Chronic systolic (congestive) heart failure: Secondary | ICD-10-CM | POA: Diagnosis not present

## 2024-10-19 LAB — ECHOCARDIOGRAM COMPLETE
AR max vel: 2.01 cm2
AV Area VTI: 1.93 cm2
AV Area mean vel: 2.01 cm2
AV Mean grad: 7 mmHg
AV Peak grad: 12.5 mmHg
Ao pk vel: 1.77 m/s
Area-P 1/2: 2.5 cm2
S' Lateral: 4.26 cm

## 2024-10-21 ENCOUNTER — Ambulatory Visit (HOSPITAL_COMMUNITY): Payer: Self-pay | Admitting: Cardiology

## 2024-10-21 DIAGNOSIS — I447 Left bundle-branch block, unspecified: Secondary | ICD-10-CM

## 2024-10-21 DIAGNOSIS — I5022 Chronic systolic (congestive) heart failure: Secondary | ICD-10-CM

## 2024-10-26 ENCOUNTER — Ambulatory Visit (INDEPENDENT_AMBULATORY_CARE_PROVIDER_SITE_OTHER): Payer: Medicare (Managed Care) | Admitting: Emergency Medicine

## 2024-10-26 VITALS — Ht 71.0 in | Wt 171.0 lb

## 2024-10-26 DIAGNOSIS — Z0000 Encounter for general adult medical examination without abnormal findings: Secondary | ICD-10-CM | POA: Diagnosis not present

## 2024-10-26 NOTE — Progress Notes (Signed)
 Subjective:   Eugene White is a 78 y.o. who presents for a Medicare Wellness preventive visit.  As a reminder, Annual Wellness Visits don't include a physical exam, and some assessments may be limited, especially if this visit is performed virtually. We may recommend an in-person follow-up visit with your provider if needed.  Visit Complete: Virtual I connected with  [name] on 10/26/24 by a audio enabled telemedicine application and verified that I am speaking with the correct person using two identifiers.  Patient Location: Home  Provider Location: Home Office  I discussed the limitations of evaluation and management by telemedicine. The patient expressed understanding and agreed to proceed.  Vital Signs: Because this visit was a virtual/telehealth visit, some criteria may be missing or patient reported. Any vitals not documented were not able to be obtained and vitals that have been documented are patient reported.  VideoDeclined- This patient declined Librarian, academic. Therefore the visit was completed with audio only.  Persons Participating in Visit: Patient.  AWV Questionnaire: No: Patient Medicare AWV questionnaire was not completed prior to this visit.  Cardiac Risk Factors include: advanced age (>45men, >34 women);male gender;hypertension;dyslipidemia;Other (see comment);sedentary lifestyle, Risk factor comments: CAD     Objective:    Today's Vitals   10/26/24 1502  Weight: 171 lb (77.6 kg)  Height: 5' 11 (1.803 m)  PainSc: 2    Body mass index is 23.85 kg/m.     10/26/2024    3:17 PM 06/18/2024   12:00 AM 06/17/2024    2:19 PM 10/28/2023    4:19 PM 07/13/2023    4:02 PM 07/18/2022   10:28 AM 06/18/2022    3:12 PM  Advanced Directives  Does Patient Have a Medical Advance Directive? Yes No No Yes Yes Yes No  Type of Estate Agent of [place];Living will   Living will Healthcare Power of  [place];Living will    Does patient want to make changes to medical advance directive? No - Patient declined        Copy of Healthcare Power of Attorney in Chart? Yes - validated most recent copy scanned in chart (See row information)        Would patient like information on creating a medical advance directive?  No - Patient declined No - Patient declined No - Patient declined   No - Patient declined    Current Medications (verified) Outpatient Encounter Medications as of 10/26/2024  Medication Sig   acetaminophen  (TYLENOL ) 500 MG tablet Take 1,500 mg by mouth every 6 (six) hours as needed for moderate pain (pain score 4-6).   apixaban  (ELIQUIS ) 5 MG TABS tablet Take 1 tablet (5 mg total) by mouth 2 (two) times daily.   atorvastatin  (LIPITOR) 40 MG tablet Take 1 tablet (40 mg total) by mouth daily.   Cholecalciferol  (VITAMIN D ) 2000 UNITS CAPS Take 2,000 Units by mouth every evening.    furosemide  (LASIX ) 40 MG tablet Take 1 tablet (40 mg total) by mouth daily.   ibuprofen (ADVIL) 200 MG tablet Take 800 mg by mouth every 6 (six) hours as needed (pain).   levothyroxine  (SYNTHROID ) 100 MCG tablet TAKE 1 TABLET DAILY   magnesium  oxide (MAG-OX) 400 (240 Mg) MG tablet TAKE 1 TABLET TWICE DAILY .   metoprolol  succinate (TOPROL -XL) 25 MG 24 hr tablet Take 1 tablet (25 mg total) by mouth daily. Take with or immediately following a meal.   Multiple Vitamin (MULTIVITAMIN WITH MINERALS) TABS tablet Take 1  tablet by mouth daily.    Multiple Vitamins-Minerals (PRESERVISION AREDS 2) CAPS Take 1 capsule by mouth 2 (two) times daily.    pantoprazole  (PROTONIX ) 40 MG tablet Take 1 tablet (40 mg total) by mouth daily.   potassium chloride  SA (KLOR-CON  M20) 20 MEQ tablet Take 1 tablet (20 mEq total) by mouth daily.   sacubitril -valsartan  (ENTRESTO ) 24-26 MG TAKE ENTRESTO  12-13 MG TWICE DAILY (1/2 TABLET BID)   spironolactone  (ALDACTONE ) 25 MG tablet Take 0.5 tablets (12.5 mg total) by mouth daily.    tamsulosin  (FLOMAX ) 0.4 MG CAPS capsule Take 1 capsule (0.4 mg total) by mouth daily after supper.   venlafaxine  XR (EFFEXOR -XR) 75 MG 24 hr capsule TAKE 1 CAPSULE EVERY DAY   zolpidem  (AMBIEN ) 5 MG tablet Take 1 tablet (5 mg total) by mouth at bedtime as needed for sleep.   gentamicin ointment (GARAMYCIN) 0.1 % Apply topically. (Patient not taking: Reported on 10/26/2024)   No facility-administered encounter medications on file as of 10/26/2024.    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   First degree AV block    GERD (gastroesophageal reflux disease)    GI symptom    had nausea / vomited once / frequent stools / getting better   Hyperlipidemia    Hypertension    Hypothyroidism    Mild carotid artery disease    Mitral regurgitation    Mood changes    PAF (paroxysmal atrial fibrillation) (HCC)    Pneumonia    hx of   Tricuspid regurgitation    Tricuspid regurgitation    Past Surgical History:  Procedure Laterality Date   CARDIOVERSION N/A 01/12/2018   Procedure: CARDIOVERSION;  Surgeon: Maranda Leim DEL, MD;  Location: [facility] ENDOSCOPY;  Service: Cardiovascular;  Laterality: N/A;   CARDIOVERSION N/A 01/26/2019   Procedure: CARDIOVERSION;  Surgeon: Jeffrie Oneil BROCKS, MD;  Location: [facility] ENDOSCOPY;  Service: Cardiovascular;  Laterality: N/A;   CARDIOVERSION N/A 09/30/2019   Procedure: CARDIOVERSION;  Surgeon: Pietro Redell RAMAN, MD;  Location: [facility] ENDOSCOPY;  Service: Cardiovascular;  Laterality: N/A;   CARDIOVERSION N/A 04/04/2022   Procedure: CARDIOVERSION;  Surgeon: Alvan Ronal BRAVO,  MD;  Location: [facility] ENDOSCOPY;  Service: Cardiovascular;  Laterality: N/A;   COLONOSCOPY  2014   CYSTOSCOPY WITH STENT PLACEMENT Left 06/18/2022   Procedure: CYSTOSCOPY WITH STENT PLACEMENT;  Surgeon: Penne Knee, MD;  Location: [facility] ORS;  Service: Urology;  Laterality: Left;   CYSTOSCOPY WITH STENT PLACEMENT Left 07/21/2022   Procedure: CYSTOSCOPY WITH STENT EXCHANGE;  Surgeon: Penne Knee, MD;  Location: [facility] ORS;  Service: Urology;  Laterality: Left;   FEMUR IM NAIL Left 10/11/2016   Procedure: INTRAMEDULLARY (IM) NAIL FEMORAL;  Surgeon: Norleen JINNY Maltos, MD;  Location: [facility] ORS;  Service: Orthopedics;  Laterality: Left;   HAND SURGERY   1973 / 2010 / 2015   right to release tendons   HARDWARE REMOVAL Left 10/05/2017   Procedure: Removal of left femoral nail;  Surgeon: Ernie Cough, MD;  Location: [facility] ORS;  Service: Orthopedics;  Laterality: Left;  90 mins   HEMATOMA  EVACUATION Left 01/19/2018   Procedure: Irrigation and debridement, Evacuation of left total knee hematoma;  Surgeon: Ernie Cough, MD;  Location: [facility] ORS;  Service: Orthopedics;  Laterality: Left;   KNEE ARTHROSCOPY  1984 & 2001   twice   NASAL SEPTUM SURGERY     removal Left femoral nail      10/05/17 Dr. ERNIE   RIGHT HEART CATH AND CORONARY ANGIOGRAPHY N/A 06/21/2024   Procedure: RIGHT HEART CATH AND CORONARY ANGIOGRAPHY;  Surgeon: Mady Bruckner, MD;  Location: [facility] INVASIVE CV LAB;  Service: Cardiovascular;  Laterality: N/A;   SHOULDER OPEN ROTATOR CUFF REPAIR  1999 & 2006   Right and left   TEE WITH CARDIOVERSION  01/12/2018   TEE WITHOUT CARDIOVERSION N/A 01/12/2018   Procedure: TRANSESOPHAGEAL ECHOCARDIOGRAM (TEE);  Surgeon: Maranda Leim DEL, MD;  Location: [facility] ENDOSCOPY;  Service: Cardiovascular;  Laterality: N/A;   TONSILLECTOMY     TOTAL KNEE ARTHROPLASTY Right 01/09/2015   Procedure: RIGHT TOTAL KNEE ARTHROPLASTY;  Surgeon: Cough JONETTA Ernie, MD;  Location: [facility] ORS;  Service: Orthopedics;  Laterality: Right;   TOTAL  KNEE ARTHROPLASTY Left 11/23/2017   Procedure: LEFT TOTAL KNEE ARTHROPLASTY;  Surgeon: Ernie Cough, MD;  Location: [facility] ORS;  Service: Orthopedics;  Laterality: Left;  90 mins   URETERAL BIOPSY Left 07/21/2022   Procedure: URETERAL BIOPSY;  Surgeon: Penne Knee, MD;  Location: [facility] ORS;  Service: Urology;  Laterality: Left;   URETEROSCOPY Left 07/21/2022   Procedure: URETEROSCOPY;  Surgeon: Penne Knee, MD;  Location: [facility] ORS;  Service: Urology;  Laterality: Left;   Family History  Problem Relation Age of Onset   Breast cancer Mother    Other Father 38       died in his sleep   Colon cancer Neg Hx    Esophageal cancer Neg Hx    Stomach cancer Neg Hx    Rectal cancer Neg Hx    Social History   Socioeconomic History   Marital status: Married    Spouse name: Scientist, Product/process Development   Number of children: 2   Years of education: Not on file   Highest education level: Professional school degree (e.g., MD, DDS, DVM, JD)  Occupational History   Occupation: retired, has degree in physiology  Tobacco Use   Smoking status: Former    Current packs/day: 0.00    Types: Cigarettes    Quit date: 12/29/1968    Years since quitting: 55.8   Smokeless tobacco: Never  Vaping Use   Vaping status: Never Used  Substance and Sexual Activity   Alcohol use: Not Currently    Alcohol/week: 21.0 standard drinks of alcohol    Types: 21 Cans of beer per week    Comment: last use 05/2024   Drug use: No   Sexual activity: Yes  Other Topics Concern   Not on file  Social History Narrative   Pt lives in [place] w/ wife.   Social Drivers of Corporate Investment Banker Strain: Low Risk  (10/26/2024)   Overall Financial Resource Strain (CARDIA)    Difficulty of Paying Living Expenses: Not hard at all  Food Insecurity: No Food Insecurity (10/26/2024)   Hunger Vital Sign    Worried About Running Out of Food in the Last Year: Never true    Ran Out of Food in the Last Year: Never true  Transportation Needs: No  Transportation Needs (10/26/2024)   PRAPARE - Administrator, Civil Service (Medical): No    Lack of Transportation (Non-Medical):  No  Physical Activity: Insufficiently Active (10/26/2024)   Exercise Vital Sign    Days of Exercise per Week: 4 days    Minutes of Exercise per Session: 20 min  Stress: No Stress Concern Present (10/26/2024)   [employer] of Occupational Health - Occupational Stress Questionnaire    Feeling of Stress: Not at all  Social Connections: Moderately Isolated (10/26/2024)   Social Connection and Isolation Panel    Frequency of Communication with Friends and Family: More than three times a week    Frequency of Social Gatherings with Friends and Family: Once a week    Attends Religious Services: Never    Database Administrator or Organizations: No    Attends Engineer, Structural: Never    Marital Status: Married    Tobacco Counseling Counseling given: Not Answered    Clinical Intake:  Pre-visit preparation completed: Yes  Pain : 0-10 Pain Score: 2  Pain Type: Chronic pain Pain Location: Back Pain Descriptors / Indicators: Aching     BMI - recorded: 23.85 Nutritional Status: BMI of 19-24  Normal Nutritional Risks: None Diabetes: No  Lab Results  Component Value Date   HGBA1C 4.6 (L) 04/07/2024   HGBA1C 4.6 (L) 04/02/2023   HGBA1C 5.1 02/14/2022     How often do you need to have someone help you when you read instructions, pamphlets, or other written materials from your doctor or pharmacy?: 1 - Never  Interpreter Needed?: No  Information entered by :: Vina Ned, CMA   Activities of Daily Living     10/26/2024    3:04 PM 06/18/2024   12:00 AM  In your present state of health, do you have any difficulty performing the following activities:  Hearing? 0 0  Vision? 0 0  Difficulty concentrating or making decisions? 0 0  Walking or climbing stairs? 1   Comment uses a cane   Dressing or bathing? 0   Doing  errands, shopping? 0 0  Preparing Food and eating ? N   Using the Toilet? N   In the past six months, have you accidently leaked urine? N   Do you have problems with loss of bowel control? N   Managing your Medications? N   Managing your Finances? N   Housekeeping or managing your Housekeeping? N     Patient Care Team: Myrla Jon HERO, MD as PCP - General (Family Medicine) Dingeldein, Steven, MD as Consulting Physician (Ophthalmology) End, Lonni, MD as Consulting Physician (Cardiology) Francisca Redell BROCKS, MD as Consulting Physician (Urology) Donette Ellouise LABOR, FNP (Cardiology)  I have updated your Care Teams any recent Medical Services you may have received from other providers in the past year.     Assessment:   This is a routine wellness examination for Jasman.  Hearing/Vision screen Hearing Screening - Comments:: Denies hearing loss  Vision Screening - Comments:: UTD @ Dr. Dingeldein,  [place]   Goals Addressed               This Visit's Progress     Patient Stated (pt-stated)        Continue to work with heart failure clinic and get better       Depression Screen     10/26/2024    3:15 PM 06/28/2024    2:19 PM 06/10/2024    2:55 PM 04/07/2024    2:13 PM 09/04/2023    3:15 PM 07/13/2023    3:58 PM 04/02/2023    4:10 PM  PHQ 2/9 Scores  PHQ - 2 Score 0 0 2 0 0 0 0  PHQ- 9 Score 1 6 11 5 5  4     Fall Risk     10/26/2024    3:19 PM 04/07/2024    2:13 PM 09/04/2023    3:14 PM 07/13/2023    3:49 PM 04/02/2023    4:10 PM  Fall Risk   Falls in the past year? 1 1  1 1   Number falls in past yr: 1 1 1 1 1   Injury with Fall? 1 1 1  0 0  Risk for fall due to : History of fall(s);Impaired balance/gait;Orthopedic patient;Impaired mobility History of fall(s) History of fall(s);Impaired balance/gait;Impaired mobility;Impaired vision History of fall(s);Impaired balance/gait;Orthopedic patient   Follow up Falls evaluation completed;Education provided Falls evaluation  completed  Falls prevention discussed;Education provided     MEDICARE RISK AT HOME:  Medicare Risk at Home Any stairs in or around the home?: Yes If so, are there any without handrails?: Yes Home free of loose throw rugs in walkways, pet beds, electrical cords, etc?: Yes Adequate lighting in your home to reduce risk of falls?: Yes Life alert?: No Use of a cane, walker or w/c?: Yes (uses a cane) Grab bars in the bathroom?: Yes Shower chair or bench in shower?: Yes Elevated toilet seat or a handicapped toilet?: Yes  TIMED UP AND GO:  Was the test performed?  No  Cognitive Function: 6CIT completed        10/26/2024    3:21 PM 07/13/2023    4:04 PM  6CIT Screen  What Year? 0 points 0 points  What month? 0 points 0 points  What time? 0 points 0 points  Count back from 20 0 points 0 points  Months in reverse 0 points 0 points  Repeat phrase 0 points 0 points  Total Score 0 points 0 points    Immunizations Immunization History  Administered Date(s) Administered   Fluad Quad(high Dose 65+) 10/11/2019, 10/01/2022   INFLUENZA, HIGH DOSE SEASONAL PF 11/01/2015, 09/15/2017, 10/11/2024   Influenza-Unspecified 09/28/2013, 10/07/2018, 11/05/2021   PFIZER(Purple Top)SARS-COV-2 Vaccination 02/04/2020, 02/28/2020, 10/18/2020   Pneumococcal Conjugate-13 09/15/2017   Pneumococcal Polysaccharide-23 01/07/2013   Pneumococcal-Unspecified 04/28/2018   Tdap 10/28/2023   Zoster, Live 01/07/2013    Screening Tests Health Maintenance  Topic Date Due   Hepatitis C Screening  Never done   Zoster Vaccines- Shingrix (1 of 2) 08/25/1965   COVID-19 Vaccine (4 - 2025-26 season) 08/29/2024   Medicare Annual Wellness (AWV)  10/26/2025   DTaP/Tdap/Td (2 - Td or Tdap) 10/27/2033   Pneumococcal Vaccine: 50+ Years  Completed   Influenza Vaccine  Completed   Meningococcal B Vaccine  Aged Out   Colonoscopy  Discontinued    Health Maintenance Items Addressed: Vaccines Due: Shingrix, See Nurse  Notes at the end of this note  Additional Screening:  Vision Screening: Recommended annual ophthalmology exams for early detection of glaucoma and other disorders of the eye. Is the patient up to date with their annual eye exam?  No  Who is the provider or what is the name of the office in which the patient attends annual eye exams? Dr. Dingeldein @ [facility] [place]  Dental Screening: Recommended annual dental exams for proper oral hygiene  Community Resource Referral / Chronic Care Management: CRR required this visit?  No   CCM required this visit?  No   Plan:    I have personally reviewed and noted the following in  the patient's chart:   Medical and social history Use of alcohol, tobacco or illicit drugs  Current medications and supplements including opioid prescriptions. Patient is not currently taking opioid prescriptions. Functional ability and status Nutritional status Physical activity Advanced directives List of other physicians Hospitalizations, surgeries, and ER visits in previous 12 months Vitals Screenings to include cognitive, depression, and falls Referrals and appointments  In addition, I have reviewed and discussed with patient certain preventive protocols, quality metrics, and best practice recommendations. A written personalized care plan for preventive services as well as general preventive health recommendations were provided to patient.   Vina Ned, CMA   10/26/2024   After Visit Summary: (MyChart) Due to this being a telephonic visit, the after visit summary with patients personalized plan was offered to patient via MyChart   Notes:  Needs Shingrix vaccines (pharmacy) Declined Covid vaccines Screening colonoscopy no longer recommended due to age.

## 2024-10-26 NOTE — Patient Instructions (Signed)
 [name],  Thank you for taking the time for your Medicare Wellness Visit. I appreciate your continued commitment to your health goals. Please review the care plan we discussed, and feel free to reach out if I can assist you further.  Medicare recommends these wellness visits once per year to help you and your care team stay ahead of potential health issues. These visits are designed to focus on prevention, allowing your provider to concentrate on managing your acute and chronic conditions during your regular appointments.  Please note that Annual Wellness Visits do not include a physical exam. Some assessments may be limited, especially if the visit was conducted virtually. If needed, we may recommend a separate in-person follow-up with your provider.  Ongoing Care Seeing your primary care provider every 3 to 6 months helps us  monitor your health and provide consistent, personalized care.   Referrals If a referral was made during today's visit and you haven't received any updates within two weeks, please contact the referred provider directly to check on the status.  Recommended Screenings: You may get the Shingrix (shingles) vaccines at your local pharmacy at your convenience.   Health Maintenance  Topic Date Due   Hepatitis C Screening  Never done   Zoster (Shingles) Vaccine (1 of 2) 08/25/1965   COVID-19 Vaccine (4 - 2025-26 season) 08/29/2024   Medicare Annual Wellness Visit  10/26/2025   DTaP/Tdap/Td vaccine (2 - Td or Tdap) 10/27/2033   Pneumococcal Vaccine for age over 57  Completed   Flu Shot  Completed   Meningitis B Vaccine  Aged Out   Colon Cancer Screening  Discontinued       10/26/2024    3:17 PM  Advanced Directives  Does Patient Have a Medical Advance Directive? Yes  Type of Estate Agent of [place];Living will  Does patient want to make changes to medical advance directive? No - Patient declined  Copy of Healthcare Power of Attorney in  Chart? Yes - validated most recent copy scanned in chart (See row information)   Advance Care Planning is important because it: Ensures you receive medical care that aligns with your values, goals, and preferences. Provides guidance to your family and loved ones, reducing the emotional burden of decision-making during critical moments.  Vision: Annual vision screenings are recommended for early detection of glaucoma, cataracts, and diabetic retinopathy. These exams can also reveal signs of chronic conditions such as diabetes and high blood pressure.  Dental: Annual dental screenings help detect early signs of oral cancer, gum disease, and other conditions linked to overall health, including heart disease and diabetes.  Please see the attached documents for additional preventive care recommendations.   Fall Prevention in the Home, Adult Falls can cause injuries and affect people of all ages. There are many simple things that you can do to make your home safe and to help prevent falls. If you need it, ask for help making these changes. What actions can I take to prevent falls? General information Use good lighting in all rooms. Make sure to: Replace any light bulbs that burn out. Turn on lights if it is dark and use night-lights. Keep items that you use often in easy-to-reach places. Lower the shelves around your home if needed. Move furniture so that there are clear paths around it. Do not keep throw rugs or other things on the floor that can make you trip. If any of your floors are uneven, fix them. Add color or contrast paint or tape  to clearly mark and help you see: Grab bars or handrails. First and last steps of staircases. Where the edge of each step is. If you use a ladder or stepladder: Make sure that it is fully opened. Do not climb a closed ladder. Make sure the sides of the ladder are locked in place. Have someone hold the ladder while you use it. Know where your pets are as  you move through your home. What can I do in the bathroom?     Keep the floor dry. Clean up any water  that is on the floor right away. Remove soap buildup in the bathtub or shower. Buildup makes bathtubs and showers slippery. Use non-skid mats or decals on the floor of the bathtub or shower. Attach bath mats securely with double-sided, non-slip rug tape. If you need to sit down while you are in the shower, use a non-slip stool. Install grab bars by the toilet and in the bathtub and shower. Do not use towel bars as grab bars. What can I do in the bedroom? Make sure that you have a light by your bed that is easy to reach. Do not use any sheets or blankets on your bed that hang to the floor. Have a firm bench or chair with side arms that you can use for support when you get dressed. What can I do in the kitchen? Clean up any spills right away. If you need to reach something above you, use a sturdy step stool that has a grab bar. Keep electrical cables out of the way. Do not use floor polish or wax that makes floors slippery. What can I do with my stairs? Do not leave anything on the stairs. Make sure that you have a light switch at the top and the bottom of the stairs. Have them installed if you do not have them. Make sure that there are handrails on both sides of the stairs. Fix handrails that are broken or loose. Make sure that handrails are as long as the staircases. Install non-slip stair treads on all stairs in your home if they do not have carpet. Avoid having throw rugs at the top or bottom of stairs, or secure the rugs with carpet tape to prevent them from moving. Choose a carpet design that does not hide the edge of steps on the stairs. Make sure that carpet is firmly attached to the stairs. Fix any carpet that is loose or worn. What can I do on the outside of my home? Use bright outdoor lighting. Repair the edges of walkways and driveways and fix any cracks. Clear paths of  anything that can make you trip, such as tools or rocks. Add color or contrast paint or tape to clearly mark and help you see high doorway thresholds. Trim any bushes or trees on the main path into your home. Check that handrails are securely fastened and in good repair. Both sides of all steps should have handrails. Install guardrails along the edges of any raised decks or porches. Have leaves, snow, and ice cleared regularly. Use sand, salt, or ice melt on walkways during winter months if you live where there is ice and snow. In the garage, clean up any spills right away, including grease or oil spills. What other actions can I take? Review your medicines with your health care provider. Some medicines can make you confused or feel dizzy. This can increase your chance of falling. Wear closed-toe shoes that fit well and support your feet. Wear  shoes that have rubber soles and low heels. Use a cane, walker, scooter, or crutches that help you move around if needed. Talk with your provider about other ways that you can decrease your risk of falls. This may include seeing a physical therapist to learn to do exercises to improve movement and strength. Where to find more information Centers for Disease Control and Prevention, [employer]: tonerpromos.no [employer] on Aging: baseringtones.pl National Institute on Aging: baseringtones.pl Contact a health care provider if: You are afraid of falling at home. You feel weak, drowsy, or dizzy at home. You fall at home. Get help right away if you: Lose consciousness or have trouble moving after a fall. Have a fall that causes a head injury. These symptoms may be an emergency. Get help right away. Call 911. Do not wait to see if the symptoms will go away. Do not drive yourself to the hospital. This information is not intended to replace advice given to you by your health care provider. Make sure you discuss any questions you have with your health care  provider. Document Revised: 08/18/2022 Document Reviewed: 08/18/2022 Elsevier Patient Education  2024 [employer].

## 2024-10-27 NOTE — Telephone Encounter (Signed)
 Referral placed for EP per dr. zenaida

## 2024-11-09 ENCOUNTER — Ambulatory Visit: Payer: Medicare (Managed Care) | Admitting: Urology

## 2024-11-09 VITALS — BP 118/79 | HR 108 | Ht 71.0 in | Wt 175.0 lb

## 2024-11-09 DIAGNOSIS — N401 Enlarged prostate with lower urinary tract symptoms: Secondary | ICD-10-CM | POA: Diagnosis not present

## 2024-11-09 DIAGNOSIS — Z125 Encounter for screening for malignant neoplasm of prostate: Secondary | ICD-10-CM | POA: Diagnosis not present

## 2024-11-09 DIAGNOSIS — R3914 Feeling of incomplete bladder emptying: Secondary | ICD-10-CM | POA: Diagnosis not present

## 2024-11-09 LAB — BLADDER SCAN AMB NON-IMAGING

## 2024-11-09 NOTE — Progress Notes (Signed)
   11/09/2024 1:47 PM   Eugene White 02-03-1946 [id number]  Reason for visit: Follow up incomplete bladder emptying, hydronephrosis, PSA screening  History: Complex urologic history, previously followed by Dr. Penne.  Severe left hydronephrosis of unclear etiology, no evidence of malignancy on diagnostic ureteroscopy, renal scan with 26% function of the left kidney, he did not tolerate stent well and that has since been removed.  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  Renal function has remained normal. We have previously offered HOLEP or other bladder management options and he has deferred with his other comorbidities and minimal urinary symptoms.  His daughter is a manufacturing engineer, I have previously discussed options with her and she understands risks and benefits as well Cystoscopy August 2025 no suspicious lesions, distended bladder, severe trabeculations  Physical Exam: BP 118/79 (BP Location: Left Arm, Patient Position: Sitting, Cuff Size: Normal)   Pulse (!) 108   Ht 5' 11 (1.803 m)   Wt 175 lb (79.4 kg)   SpO2 97%   BMI 24.41 kg/m   Imaging/labs: October 2025, renal function normal with eGFR greater than 60 Renal ultrasound July 2025 with mild right hydronephrosis, chronic left severe hydronephrosis, distended bladder with trabeculations  Today: PVR stable at Continues to deny any significant urinary symptoms aside from frequency, no incontinence, UTIs, or gross hematuria He remains resistant to considering any other bladder management options or outlet procedure  Plan:   BPH/incomplete bladder emptying: Chronic, fortunately renal function remains normal, he remains extremely resistant to any outlet procedures or catheterization.  Risk discussed extensively including retention, worsening renal function, UTIs.  Return precautions were reviewed extensively.  Will continue  Flomax . PSA screening: PSA June 2025 4.5, normal for age, consistent with BPH, low suspicion for prostate cancer RTC 9 months PVR, sooner if problems.  Would again offer HOLEP if retention, worsening renal function, or UTIs   Redell JAYSON Burnet, MD  [facility] [address], [address] [place], [place] [address] [phone]

## 2024-11-24 ENCOUNTER — Other Ambulatory Visit: Payer: Self-pay | Admitting: Family Medicine

## 2024-11-24 DIAGNOSIS — K219 Gastro-esophageal reflux disease without esophagitis: Secondary | ICD-10-CM

## 2024-11-24 DIAGNOSIS — F325 Major depressive disorder, single episode, in full remission: Secondary | ICD-10-CM

## 2024-12-29 ENCOUNTER — Other Ambulatory Visit: Payer: Self-pay | Admitting: Physician Assistant

## 2024-12-29 DIAGNOSIS — E8779 Other fluid overload: Secondary | ICD-10-CM

## 2025-01-02 NOTE — Progress Notes (Signed)
 "  Cardiology Office Note    Date:  01/06/2025   ID:  Eugene White, DOB 12-19-1946, MRN [id number]  PCP:  Myrla Jon HERO, MD  Cardiologist:  Lonni Hanson, MD  Electrophysiologist:  None   Chief Complaint: Follow-up  History of Present Illness:   Eugene White is a 79 y.o. 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  During surgery for left femoral neck nail removal in 2018 he went into A-fib with RVR, which was a new diagnosis for him.  Echo in 09/2017 showed an EF of 55 to 60% with no regional wall motion abnormalities and mildly dilated left atrium.  He was started on apixaban  and underwent successful TEE guided DCCV in 12/2017 with TEE at that time showing an EF of 55 to 60% with moderate mitral and tricuspid regurgitation and mild aortic insufficiency.  He required repeat DCCV in 12/2018 for recurrence of A-fib and was ultimately placed on flecainide  in 08/2019 and underwent repeat DCCV at that time.  He did not have a prior ischemic assessment.  ETT was recommended in 2021, though he wished to defer.  Carotid duplex in 2019 showed 1 to 39% stenosis on the left that was felt to be normal by prior cardiologist.  In 12/2020, prior cardiologist requested the patient go off of flecainide  since he had not had any recurrent issues with A-fib.  Echo in 12/2020 showed an EF of 55 to 60%, no regional wall motion abnormalities, normal RV systolic function and ventricular cavity size, mildly dilated left atrium, mild mitral regurgitation, mild to moderate aortic valve sclerosis without evidence  of stenosis, and an estimated right atrial pressure of 3 mmHg.  He was seen in the ED in 2023 with left flank pain and was found to have significant left-sided hydronephrosis and urethral stenosis status post stent placement and renal bleeding from a prior fall resulting in a hemoglobin of 8.   He was seen by general cardiology in the office in 04/2024 with notation of physical activity being limited due to spinal stenosis and arthritis.  He also reported issues with sleeping and short windedness that he attributed to inactivity.   He was admitted to the hospital on 06/17/2024 with progressive exertional fatigue and dyspnea that started 3 weeks prior with unexplained weight gain, lower extremity edema, and orthopnea.  He was admitted with acute CHF with a BNP greater than 4500.  High-sensitivity troponin trended to 24.  EKG showed new left bundle branch block.  He was IV diuresed with good response and improvement in symptoms.  Echo showed an EF of 20 to 25%, global hypokinesis, mild LVH, moderately reduced RV systolic function with mildly enlarged ventricular cavity size, moderately elevated RVSP estimated at 53.6 mmHg, moderate biatrial enlargement, moderate pleural effusion in the left lateral region, moderate to severe mitral regurgitation, moderate aortic insufficiency, aortic valve sclerosis without evidence of stenosis, and moderate pulmonic regurgitation.  Care was subsequently transitioned to advanced heart failure service.  R/LHC showed severe single-vessel CAD with diffusely diseased LAD with focal stenoses in the mid and distal vessel of up to 70%.  Ramus intermedius, LCx, and RCA demonstrated mild  to moderate nonobstructive CAD.  The degree of LV reduction was out of proportion to CAD burden suggestive of a predominantly nonischemic cardiomyopathy.  Mildly elevated left heart filling pressures, normal to mildly elevated right heart filling pressure, mild to moderate pulmonary hypertension, and normal  cardiac output/index as outlined below.  Medical therapy was favored for diffuse LAD disease with PCI to the LAD considered for refractory angina, though the disease was fairly diffuse and the vessel was relatively small.  Cardiac MRI showed an severely reduced LV systolic function with EF of 11%, no LGE or scar, no evidence of infiltrative or inflammatory disease, severely reduced RV systolic function, and no significant valvular abnormalities.   He was last seen by general cardiology on 09/28/2024 and was feeling better than he did at time of cardiac cath in 05/2024.   He has been followed by the CHF clinic with Zio patch in 08/2024 demonstrating a predominant rhythm of sinus with an average of 72 bpm, first-degree AV block, bundle branch block, 1 episode of third-degree AV block lasting 8 seconds, secondary AV block type I, and rare atrial and ventricular ectopy.  Echo in 09/2024 showed a persistent cardiomyopathy with an EF of 25%, global hypokinesis, mild LVH, grade 1 diastolic dysfunction, mildly reduced RV systolic function with normal ventricular cavity size and RVSP, mildly dilated left atrium, mild mitral regurgitation, mild to moderate tricuspid regurgitation, mild aortic insufficiency, and a normal CVP.  Advanced heart failure has recommended referral to EP for consideration of CRT with cardiomyopathy and underlying bundle branch block.  He comes in doing very well from a cardiac perspective and is without symptoms of angina or cardiac decompensation.  Indicates this is the best he has felt in quite some time.  No dyspnea, palpitations, dizziness, presyncope, or syncope.  No lower extremity swelling or progressive orthopnea.  Adherent to cardiac medications without off target effect.  He will occasionally noticed a drop of blood just prior to voiding if he has taken ibuprofen for several days in a row, otherwise denies hematochezia or melena.  Drinks approximately 2 L of liquid daily and monitor his  sodium intake.  Does not have any acute cardiac concerns at this time.   Labs independently reviewed: 09/2024 - BUN 35, serum creatinine 1.18, potassium 4.3 06/2024 - BNP 205, Hgb 10.0, PLT 243, TSH normal 05/2024 - magnesium  2.1, LP(a) 10.0, albumin 3.5, AST/ALT normal, BNP greater than 4500 03/2024 - A1c 4.6, TC 172, TG 77, HDL 103, LDL 55  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   First degree AV block    GERD (gastroesophageal reflux disease)    GI symptom    had nausea / vomited once / frequent stools / getting better   Hyperlipidemia    Hypertension    Hypothyroidism    Mild carotid artery disease    Mitral regurgitation    Mood changes    PAF (paroxysmal atrial fibrillation) (HCC)    Pneumonia    hx of   Tricuspid regurgitation    Tricuspid regurgitation     Past Surgical History:  Procedure Laterality  Date   CARDIOVERSION N/A 01/12/2018   Procedure: CARDIOVERSION;  Surgeon: Maranda Leim DEL, MD;  Location: [facility] ENDOSCOPY;  Service: Cardiovascular;  Laterality: N/A;   CARDIOVERSION N/A 01/26/2019   Procedure: CARDIOVERSION;  Surgeon: Jeffrie Oneil BROCKS, MD;  Location: [facility] ENDOSCOPY;  Service: Cardiovascular;  Laterality: N/A;   CARDIOVERSION N/A 09/30/2019   Procedure: CARDIOVERSION;  Surgeon: Pietro Redell RAMAN, MD;  Location: [facility] ENDOSCOPY;  Service: Cardiovascular;  Laterality: N/A;   CARDIOVERSION N/A 04/04/2022   Procedure: CARDIOVERSION;  Surgeon: Alvan Ronal BRAVO, MD;  Location: [facility] ENDOSCOPY;  Service: Cardiovascular;  Laterality: N/A;   COLONOSCOPY  2014   CYSTOSCOPY WITH STENT PLACEMENT Left 06/18/2022   Procedure:  CYSTOSCOPY WITH STENT PLACEMENT;  Surgeon: Penne Knee, MD;  Location: [facility] ORS;  Service: Urology;  Laterality: Left;   CYSTOSCOPY WITH STENT PLACEMENT Left 07/21/2022   Procedure: CYSTOSCOPY WITH STENT EXCHANGE;  Surgeon: Penne Knee, MD;  Location: [facility] ORS;  Service: Urology;  Laterality: Left;   FEMUR IM NAIL Left 10/11/2016   Procedure: INTRAMEDULLARY (IM) NAIL FEMORAL;  Surgeon: Norleen JINNY Maltos, MD;  Location: [facility] ORS;  Service: Orthopedics;  Laterality: Left;   HAND SURGERY   1973 / 2010 / 2015   right to release tendons   HARDWARE REMOVAL Left 10/05/2017   Procedure: Removal of left femoral nail;  Surgeon: Ernie Cough, MD;  Location: [facility] ORS;  Service: Orthopedics;  Laterality: Left;  90 mins   HEMATOMA EVACUATION Left 01/19/2018   Procedure: Irrigation and debridement, Evacuation of left total knee hematoma;  Surgeon: Ernie Cough, MD;  Location: [facility] ORS;  Service: Orthopedics;  Laterality: Left;   KNEE ARTHROSCOPY  1984 & 2001   twice   NASAL SEPTUM SURGERY     removal Left femoral nail      10/05/17 Dr. ERNIE   RIGHT HEART CATH AND CORONARY ANGIOGRAPHY N/A 06/21/2024   Procedure: RIGHT HEART CATH AND CORONARY ANGIOGRAPHY;  Surgeon: Mady Bruckner, MD;  Location: [facility] INVASIVE CV LAB;  Service: Cardiovascular;  Laterality: N/A;   SHOULDER OPEN ROTATOR CUFF REPAIR  1999 & 2006   Right and left   TEE WITH CARDIOVERSION  01/12/2018   TEE WITHOUT CARDIOVERSION N/A 01/12/2018   Procedure: TRANSESOPHAGEAL ECHOCARDIOGRAM (TEE);  Surgeon: Maranda Leim DEL, MD;  Location: [facility] ENDOSCOPY;  Service: Cardiovascular;  Laterality: N/A;   TONSILLECTOMY     TOTAL KNEE ARTHROPLASTY Right 01/09/2015   Procedure: RIGHT TOTAL KNEE ARTHROPLASTY;  Surgeon: Cough JONETTA Ernie, MD;  Location: [facility] ORS;  Service: Orthopedics;  Laterality: Right;   TOTAL KNEE ARTHROPLASTY Left 11/23/2017   Procedure: LEFT TOTAL KNEE ARTHROPLASTY;  Surgeon: Ernie Cough, MD;  Location: [facility] ORS;  Service: Orthopedics;   Laterality: Left;  90 mins   URETERAL BIOPSY Left 07/21/2022   Procedure: URETERAL BIOPSY;  Surgeon: Penne Knee, MD;  Location: [facility] ORS;  Service: Urology;  Laterality: Left;   URETEROSCOPY Left 07/21/2022   Procedure: URETEROSCOPY;  Surgeon: Penne Knee, MD;  Location: [facility] ORS;  Service: Urology;  Laterality: Left;    Current Medications: Active Medications[1]  Allergies:   Sulfa antibiotics   Social History   Socioeconomic History   Marital status: Married    Spouse name: Holli   Number of children: 2   Years of education: Not on file   Highest education level: Professional school degree (e.g., MD, DDS, DVM, JD)  Occupational History   Occupation: retired, has degree in physiology  Tobacco Use   Smoking status: Former    Current packs/day:  0.00    Types: Cigarettes    Quit date: 12/29/1968    Years since quitting: 56.0   Smokeless tobacco: Never  Vaping Use   Vaping status: Never Used  Substance and Sexual Activity   Alcohol use: Not Currently    Alcohol/week: 21.0 standard drinks of alcohol    Types: 21 Cans of beer per week    Comment: last use 05/2024   Drug use: No   Sexual activity: Yes  Other Topics Concern   Not on file  Social History Narrative   Pt lives in [place] w/ wife.   Social Drivers of Health   Tobacco Use: Medium Risk (01/06/2025)   Patient History    Smoking Tobacco Use: Former    Smokeless Tobacco Use: Never    Passive Exposure: Not on file  Financial Resource Strain: Low Risk (10/26/2024)   Overall Financial Resource Strain (CARDIA)    Difficulty of Paying Living Expenses: Not hard at all  Food Insecurity: No Food Insecurity (10/26/2024)   Epic    Worried About Programme Researcher, Broadcasting/film/video in the Last Year: Never true    Ran Out of Food in the Last Year: Never true  Transportation Needs: No Transportation Needs (10/26/2024)   Epic    Lack of Transportation (Medical): No    Lack of Transportation (Non-Medical): No  Physical Activity:  Insufficiently Active (10/26/2024)   Exercise Vital Sign    Days of Exercise per Week: 4 days    Minutes of Exercise per Session: 20 min  Stress: No Stress Concern Present (10/26/2024)   [employer] of Occupational Health - Occupational Stress Questionnaire    Feeling of Stress: Not at all  Social Connections: Moderately Isolated (10/26/2024)   Social Connection and Isolation Panel    Frequency of Communication with Friends and Family: More than three times a week    Frequency of Social Gatherings with Friends and Family: Once a week    Attends Religious Services: Never    Database Administrator or Organizations: No    Attends Banker Meetings: Never    Marital Status: Married  Depression (PHQ2-9): Low Risk (10/26/2024)   Depression (PHQ2-9)    PHQ-2 Score: 1  Alcohol Screen: Low Risk (10/26/2024)   Alcohol Screen    Last Alcohol Screening Score (AUDIT): 0  Housing: Low Risk (10/26/2024)   Epic    Unable to Pay for Housing in the Last Year: No    Number of Times Moved in the Last Year: 0    Homeless in the Last Year: No  Utilities: Not At Risk (10/26/2024)   Epic    Threatened with loss of utilities: No  Health Literacy: Adequate Health Literacy (10/26/2024)   B1300 Health Literacy    Frequency of need for help with medical instructions: Never     Family History:  The patient's family history includes Breast cancer in his mother; Other (age of onset: 61) in his father. There is no history of Colon cancer, Esophageal cancer, Stomach cancer, or Rectal cancer.  ROS:   12-point review of systems is negative unless otherwise noted in the HPI.   EKGs/Labs/Other Studies Reviewed:    Studies reviewed were summarized above. The additional studies were reviewed today:  2D echo 10/19/2024: 1. Left ventricular ejection fraction, by estimation, is 25%   2. Left ventricular ejection fraction by 3D volume is 21 %. The left  ventricle has severely decreased  function. The left ventricle demonstrates  global hypokinesis. There  is mild left ventricular hypertrophy. Left  ventricular diastolic parameters are  consistent with Grade I diastolic dysfunction (impaired relaxation).   3. Right ventricular systolic function is mildly reduced. The right  ventricular size is normal. There is normal pulmonary artery systolic  pressure. The estimated right ventricular systolic pressure is 30.6 mmHg.   4. Left atrial size was mildly dilated.   5. The mitral valve is normal in structure. Mild mitral valve  regurgitation. No evidence of mitral stenosis.   6. Tricuspid valve regurgitation is mild to moderate.   7. The aortic valve is normal in structure. Aortic valve regurgitation is  mild. Aortic valve sclerosis is present, with no evidence of aortic valve  stenosis.   8. The inferior vena cava is normal in size with greater than 50%  respiratory variability, suggesting right atrial pressure of 3 mmHg.  __________  Zio patch 08/2024: Patient had a min HR of 26 bpm, max HR of 156 bpm, and avg HR of 72 bpm. Predominant underlying rhythm was Sinus Rhythm. First Degree AV Block was present. Bundle Branch Block/IVCD was present. 1 episode(s) of AV Block (3rd) occurred, lasting a total  of 8 secs. Second Degree AV Block-Mobitz I (Wenckebach) was present. Isolated SVEs were rare (<1.0%), SVE Couplets were rare (<1.0%), and SVE Triplets were rare (<1.0%). Isolated VEs were rare (<1.0%), VE Couplets were rare (<1.0%), and no VE Triplets  were present. Ventricular Bigeminy and Trigeminy were present. MD notification criteria for Complete Heart Block met - report posted prior to notification (FG). Discussed in clinic.  __________  Cardiac MRI 06/22/2024: IMPRESSION: 1. Normal LV size, severely reduced LV systolic function. LVEF 11%. 2.  No LGE or scar. 3.  No evidence for infiltrative or inflammatory disease. 4.  Severely reduced RV systolic function. 5.  No  significant valvular abnormalities. __________   [facility] 06/21/2024: Conclusions: Severe single-vessel coronary artery disease with diffusely diseased LAD with focal stenoses in the mid and distal vessel of up to 70%.  Ramus intermedius, LCx, and RCA demonstrate mild-moderate, nonobstructive coronary artery disease.  Degree of LVEF reduction is out of proportion to CAD burden suggestive of predominantly nonischemic cardiomyopathy. Mildly elevated left heart filling pressures (PCWP 20 mmHg). Normal to mildly elevated right heart filling pressures (mean RA 5 mmHg, RVEDP 7 mmHg). Mild to moderate pulmonary hypertension (PAP 43/22, mean 29 mmHg). Normal Fick cardiac output/index (CO 5.8 L/min, CI 2.9 L/min/m).   Recommendations: Continue gentle diuresis and optimization of evidence-based heart failure therapy. Favor medical therapy of diffuse LAD disease.  If the patient were to develop angina, PCI to the LAD could be considered though disease is fairly diffuse and vessel is relatively small. Aggressive secondary prevention of coronary artery disease. Resume heparin  infusion 2 hours after TR band removal.  Transition to apixaban  as soon as tomorrow if there are no other invasive procedures planned and no evidence of bleeding/vascular injury at catheterization sites. __________   2D echo 06/18/2024: 1. Left ventricular ejection fraction, by estimation, is 20 to 25%. Left  ventricular ejection fraction by 2D MOD biplane is 21.7 %. The left  ventricle has severely decreased function. The left ventricle demonstrates  global hypokinesis. There is mild  left ventricular hypertrophy. Left ventricular diastolic parameters are  indeterminate. The average left ventricular global longitudinal strain is  -6.8 %.   2. Right ventricular systolic function is moderately reduced. The right  ventricular size is mildly enlarged. There is moderately elevated  pulmonary artery systolic pressure. The estimated  right  ventricular  systolic pressure is 53.6 mmHg.   3. Left atrial size was moderately dilated.   4. Right atrial size was moderately dilated.   5. Moderate pleural effusion in the left lateral region.   6. The mitral valve is normal in structure. Moderate to severe mitral  valve regurgitation. No evidence of mitral stenosis.   7. The aortic valve is calcified. There is moderate calcification of the  aortic valve. Aortic valve regurgitation is moderate. Aortic valve  sclerosis/calcification is present, without any evidence of aortic  stenosis. Aortic valve mean gradient measures  3.0 mmHg.   8. Pulmonic valve regurgitation is moderate.   9. The inferior vena cava is normal in size with greater than 50%  respiratory variability, suggesting right atrial pressure of 3 mmHg.  __________   2D echo 01/21/2021: 1. Left ventricular ejection fraction, by estimation, is 55 to 60%. The  left ventricle has normal function. The left ventricle has no regional  wall motion abnormalities. Left ventricular diastolic function could not  be evaluated.   2. Right ventricular systolic function is normal. The right ventricular  size is normal.   3. Left atrial size was mildly dilated.   4. The mitral valve is normal in structure. Mild mitral valve  regurgitation. No evidence of mitral stenosis.   5. The aortic valve is normal in structure. There is mild calcification  of the aortic valve. There is moderate thickening of the aortic valve.  Aortic valve regurgitation is not visualized. Mild to moderate aortic  valve sclerosis/calcification is  present, without any evidence of aortic stenosis.   6. The inferior vena cava is normal in size with greater than 50%  respiratory variability, suggesting right atrial pressure of 3 mmHg.   Comparison(s): No significant change from prior study.  __________   TEE 01/12/2018: - Left ventricle: Systolic function was normal. The estimated    ejection fraction was in the  range of 55% to 60%. Wall motion was    normal; there were no regional wall motion abnormalities.  - Aortic valve: There was mild regurgitation.  - Mitral valve: There was moderate regurgitation.  - Left atrium: The atrium was dilated. No evidence of thrombus in    the atrial cavity or appendage. No evidence of thrombus in the    atrial cavity or appendage. No evidence of thrombus in the atrial    cavity or appendage.  - Right atrium: The atrium was dilated. No evidence of thrombus in    the atrial cavity or appendage.  - Atrial septum: No defect or patent foramen ovale was identified.  - Tricuspid valve: There was moderate regurgitation.   Impressions:   - No cardiac source of emboli was indentified. The study was    followed by a successful cardioversion.  __________   2D echo 10/05/2017: - Left ventricle: The cavity size was normal. Wall thickness was    increased in a pattern of mild LVH. Systolic function was normal.    The estimated ejection fraction was in the range of 55% to 60%.    Wall motion was normal; there were no regional wall motion    abnormalities.  - Left atrium: The atrium was mildly dilated.   Impressions:   - Normal LV function; mild LVH; mild LAE.    EKG:  EKG is ordered today.  The EKG ordered today demonstrates NSR with fusion complexes, 95 bpm, LBBB, first-degree AV block  Recent Labs: 06/22/2024: ALT 22 06/23/2024: Magnesium  2.1  07/15/2024: TSH 1.370 07/26/2024: BNP 205.7; Hemoglobin 10.0; Platelets 243 09/28/2024: BUN 35; Creatinine, Ser 1.18; Potassium 4.3; Sodium 139  Recent Lipid Panel    Component Value Date/Time   CHOL 172 04/07/2024 1453   TRIG 77 04/07/2024 1453   HDL 103 04/07/2024 1453   CHOLHDL 2.6 04/02/2023 1611   CHOLHDL 3.8 10/11/2016 0337   VLDL UNABLE TO CALCULATE IF TRIGLYCERIDE OVER 400 mg/dL 89/85/7982 9662   LDLCALC 55 04/07/2024 1453    PHYSICAL EXAM:    VS:  BP 118/70 (BP Location: Left Arm, Patient Position: Sitting,  Cuff Size: Normal)   Pulse 95 Comment: 105 oximeter  Ht 5' 11 (1.803 m)   Wt 182 lb 9.6 oz (82.8 kg)   SpO2 98%   BMI 25.47 kg/m   BMI: Body mass index is 25.47 kg/m.  Physical Exam Vitals reviewed.  Constitutional:      Appearance: He is well-developed.  HENT:     Head: Normocephalic and atraumatic.  Eyes:     General:        Right eye: No discharge.        Left eye: No discharge.  Cardiovascular:     Rate and Rhythm: Normal rate and regular rhythm.     Heart sounds: Normal heart sounds, S1 normal and S2 normal. Heart sounds not distant. No midsystolic click and no opening snap. No murmur heard.    No friction rub.  Pulmonary:     Effort: Pulmonary effort is normal. No respiratory distress.     Breath sounds: Normal breath sounds. No decreased breath sounds, wheezing, rhonchi or rales.  Musculoskeletal:     Cervical back: Normal range of motion.     Right lower leg: No edema.     Left lower leg: No edema.  Skin:    General: Skin is warm and dry.     Nails: There is no clubbing.  Neurological:     Mental Status: He is alert and oriented to person, place, and time.  Psychiatric:        Speech: Speech normal.        Behavior: Behavior normal.        Thought Content: Thought content normal.        Judgment: Judgment normal.     Wt Readings from Last 3 Encounters:  01/06/25 182 lb 9.6 oz (82.8 kg)  11/09/24 175 lb (79.4 kg)  10/26/24 171 lb (77.6 kg)     ASSESSMENT & PLAN:   Chronic HFrEF with underlying LBBB: Euvolemic and well compensated with NYHA class II symptoms.  Etiology of cardiomyopathy likely mixed, though largely nonischemic with underlying LBBB.  Cardiac MRI largely unremarkable for infiltrative disease.  Despite optimization of GDMT, cardiomyopathy has persisted on most recent echo.  Patient is scheduled to be evaluated by EP in 02/2025 for consideration of CRT.  The relative hypotension has precluded further escalation of GDMT.  For now, continue Entresto   half tablet 24/26 mg twice daily, spironolactone  12.5 mg daily, Toprol -XL 25 mg daily, and furosemide  40 mg daily.  Moving forward, could consider addition of SGLT2 inhibitor.  Abstaining from alcohol.  Follow-up with advanced heart failure and EP as directed.  CAD involving native coronary arteries without angina: Without symptoms of angina.  Cath in 05/2024 showed moderate to severe diffuse LAD disease of up to 70% obstructive disease in the LCx and RCA with medical therapy recommended.  On apixaban  in lieu of aspirin .  Remains on atorvastatin  as outlined below.  No  indication for further ischemic testing at this time.  Persistent A-fib: Maintaining sinus rhythm on Toprol -XL 25 mg.  CHA2DS2-VASc at least 5 (CHF, HTN, age x 2, vascular disease).  Remains on apixaban  5 mg twice daily and does not meet reduced dosing criteria.  No hematochezia or melena.  Recent labs stable.  Valvular heart disease: Echo in 09/2024 demonstrated mild mitral regurgitation moderate tricuspid regurgitation and mild aortic insufficiency.  Monitor with periodic echo.  HTN: Blood pressure is well-controlled in the office today.  Continue pharmacotherapy as outlined above.  HLD: LDL 55 in 03/2024.  Remains on atorvastatin  40 mg.     Disposition: F/u with Dr. Mady or an APP in 4 months, and EP/advanced heart failure as directed.    Medication Adjustments/Labs and Tests Ordered: Current medicines are reviewed at length with the patient today.  Concerns regarding medicines are outlined above. Medication changes, Labs and Tests ordered today are summarized above and listed in the Patient Instructions accessible in Encounters.   Signed, Bernardino Bring, PA-C 01/06/2025 4:33 PM     [facility] [place] [address] [place], [place] [address] [phone]     [1]  Current Meds  Medication Sig   acetaminophen  (TYLENOL ) 500 MG tablet Take 1,500 mg by mouth every 6 (six) hours as needed for moderate  pain (pain score 4-6).   apixaban  (ELIQUIS ) 5 MG TABS tablet Take 1 tablet (5 mg total) by mouth 2 (two) times daily.   Cholecalciferol  (VITAMIN D ) 2000 UNITS CAPS Take 2,000 Units by mouth every evening.    furosemide  (LASIX ) 40 MG tablet TAKE 1 TABLET BY MOUTH EVERY DAY   gentamicin ointment (GARAMYCIN) 0.1 % Apply topically.   ibuprofen (ADVIL) 200 MG tablet Take 800 mg by mouth every 6 (six) hours as needed (pain).   levothyroxine  (SYNTHROID ) 100 MCG tablet TAKE 1 TABLET DAILY   metoprolol  succinate (TOPROL -XL) 25 MG 24 hr tablet Take 1 tablet (25 mg total) by mouth daily. Take with or immediately following a meal.   Multiple Vitamin (MULTIVITAMIN WITH MINERALS) TABS tablet Take 1 tablet by mouth daily.    Multiple Vitamins-Minerals (PRESERVISION AREDS 2) CAPS Take 1 capsule by mouth 2 (two) times daily.    pantoprazole  (PROTONIX ) 40 MG tablet TAKE 1 TABLET DAILY   potassium chloride  SA (KLOR-CON  M20) 20 MEQ tablet Take 1 tablet (20 mEq total) by mouth daily.   sacubitril -valsartan  (ENTRESTO ) 24-26 MG TAKE ENTRESTO  12-13 MG TWICE DAILY (1/2 TABLET BID)   tamsulosin  (FLOMAX ) 0.4 MG CAPS capsule Take 1 capsule (0.4 mg total) by mouth daily after supper.   venlafaxine  XR (EFFEXOR -XR) 75 MG 24 hr capsule TAKE 1 CAPSULE DAILY   zolpidem  (AMBIEN ) 5 MG tablet Take 1 tablet (5 mg total) by mouth at bedtime as needed for sleep.   [DISCONTINUED] atorvastatin  (LIPITOR) 40 MG tablet Take 1 tablet (40 mg total) by mouth daily.   [DISCONTINUED] magnesium  oxide (MAG-OX) 400 (240 Mg) MG tablet TAKE 1 TABLET TWICE DAILY .   [DISCONTINUED] spironolactone  (ALDACTONE ) 25 MG tablet Take 0.5 tablets (12.5 mg total) by mouth daily.   "

## 2025-01-06 ENCOUNTER — Encounter: Payer: Self-pay | Admitting: Physician Assistant

## 2025-01-06 ENCOUNTER — Ambulatory Visit: Payer: Medicare (Managed Care) | Attending: Physician Assistant | Admitting: Physician Assistant

## 2025-01-06 VITALS — BP 118/70 | HR 95 | Ht 71.0 in | Wt 182.6 lb

## 2025-01-06 DIAGNOSIS — I447 Left bundle-branch block, unspecified: Secondary | ICD-10-CM

## 2025-01-06 DIAGNOSIS — I071 Rheumatic tricuspid insufficiency: Secondary | ICD-10-CM

## 2025-01-06 DIAGNOSIS — I5022 Chronic systolic (congestive) heart failure: Secondary | ICD-10-CM | POA: Diagnosis not present

## 2025-01-06 DIAGNOSIS — I4819 Other persistent atrial fibrillation: Secondary | ICD-10-CM | POA: Diagnosis not present

## 2025-01-06 DIAGNOSIS — I2510 Atherosclerotic heart disease of native coronary artery without angina pectoris: Secondary | ICD-10-CM | POA: Diagnosis not present

## 2025-01-06 DIAGNOSIS — I340 Nonrheumatic mitral (valve) insufficiency: Secondary | ICD-10-CM | POA: Diagnosis not present

## 2025-01-06 DIAGNOSIS — I10 Essential (primary) hypertension: Secondary | ICD-10-CM | POA: Diagnosis not present

## 2025-01-06 DIAGNOSIS — Z79899 Other long term (current) drug therapy: Secondary | ICD-10-CM

## 2025-01-06 DIAGNOSIS — E785 Hyperlipidemia, unspecified: Secondary | ICD-10-CM | POA: Diagnosis not present

## 2025-01-06 MED ORDER — ATORVASTATIN CALCIUM 40 MG PO TABS: 40.0000 mg | ORAL_TABLET | Freq: Every day | ORAL | 3 refills | Status: AC

## 2025-01-06 MED ORDER — MAGNESIUM OXIDE -MG SUPPLEMENT 400 (240 MG) MG PO TABS: ORAL_TABLET | ORAL | 3 refills | Status: AC

## 2025-01-06 MED ORDER — SPIRONOLACTONE 25 MG PO TABS: 12.5000 mg | ORAL_TABLET | Freq: Every day | ORAL | 3 refills | Status: AC

## 2025-01-06 NOTE — Patient Instructions (Signed)
 Medication Instructions:  Your physician recommends that you continue on your current medications as directed. Please refer to the Current Medication list given to you today.   *If you need a refill on your cardiac medications before your next appointment, please call your pharmacy*  Lab Work: None ordered at this time  If you have labs (blood work) drawn today and your tests are completely normal, you will receive your results only by: MyChart Message (if you have MyChart) OR A paper copy in the mail If you have any lab test that is abnormal or we need to change your treatment, we will call you to review the results.  Testing/Procedures: None ordered at this time   Follow-Up: At [facility], you and your health needs are our priority.  As part of our continuing mission to provide you with exceptional heart care, our providers are all part of one team.  This team includes your primary Cardiologist (physician) and Advanced Practice Providers or APPs (Physician Assistants and Nurse Practitioners) who all work together to provide you with the care you need, when you need it.  Your next appointment:   4 month(s)  Provider:   You may see Lonni Hanson, MD or Bernardino Bring, PA-C

## 2025-03-07 ENCOUNTER — Ambulatory Visit: Payer: Medicare (Managed Care) | Admitting: Cardiology

## 2025-03-22 ENCOUNTER — Ambulatory Visit: Payer: Medicare (Managed Care) | Admitting: Internal Medicine

## 2025-04-11 ENCOUNTER — Ambulatory Visit: Payer: Medicare (Managed Care) | Admitting: Family Medicine

## 2025-08-09 ENCOUNTER — Ambulatory Visit: Payer: Medicare (Managed Care) | Admitting: Urology

## 2025-11-01 ENCOUNTER — Ambulatory Visit: Payer: Medicare (Managed Care)
